# Patient Record
Sex: Female | Born: 1935 | Race: White | Hispanic: No | Marital: Single | Smoking: Never smoker
Health system: Southern US, Community
[De-identification: ages and names within clinical notes are randomized; demographics above are authoritative.]

## PROBLEM LIST (undated history)

## (undated) DIAGNOSIS — M545 Low back pain, unspecified: Secondary | ICD-10-CM

## (undated) DIAGNOSIS — E785 Hyperlipidemia, unspecified: Secondary | ICD-10-CM

## (undated) DIAGNOSIS — G4730 Sleep apnea, unspecified: Secondary | ICD-10-CM

## (undated) DIAGNOSIS — K649 Unspecified hemorrhoids: Secondary | ICD-10-CM

## (undated) DIAGNOSIS — N200 Calculus of kidney: Secondary | ICD-10-CM

## (undated) DIAGNOSIS — I671 Cerebral aneurysm, nonruptured: Secondary | ICD-10-CM

## (undated) DIAGNOSIS — M1990 Unspecified osteoarthritis, unspecified site: Secondary | ICD-10-CM

## (undated) DIAGNOSIS — I10 Essential (primary) hypertension: Secondary | ICD-10-CM

## (undated) DIAGNOSIS — K5730 Diverticulosis of large intestine without perforation or abscess without bleeding: Secondary | ICD-10-CM

## (undated) DIAGNOSIS — I4821 Permanent atrial fibrillation: Secondary | ICD-10-CM

## (undated) DIAGNOSIS — F411 Generalized anxiety disorder: Secondary | ICD-10-CM

## (undated) DIAGNOSIS — T8859XA Other complications of anesthesia, initial encounter: Secondary | ICD-10-CM

## (undated) DIAGNOSIS — M5126 Other intervertebral disc displacement, lumbar region: Secondary | ICD-10-CM

## (undated) DIAGNOSIS — T4145XA Adverse effect of unspecified anesthetic, initial encounter: Secondary | ICD-10-CM

## (undated) DIAGNOSIS — K635 Polyp of colon: Secondary | ICD-10-CM

## (undated) DIAGNOSIS — I82409 Acute embolism and thrombosis of unspecified deep veins of unspecified lower extremity: Secondary | ICD-10-CM

## (undated) DIAGNOSIS — Z85828 Personal history of other malignant neoplasm of skin: Secondary | ICD-10-CM

## (undated) DIAGNOSIS — I2699 Other pulmonary embolism without acute cor pulmonale: Secondary | ICD-10-CM

## (undated) HISTORY — DX: Permanent atrial fibrillation: I48.21

## (undated) HISTORY — PX: OTHER SURGICAL HISTORY: SHX169

## (undated) HISTORY — PX: BREAST CYST EXCISION: SHX579

## (undated) HISTORY — DX: Unspecified hemorrhoids: K64.9

## (undated) HISTORY — PX: TONSILLECTOMY AND ADENOIDECTOMY: SUR1326

## (undated) HISTORY — DX: Generalized anxiety disorder: F41.1

## (undated) HISTORY — PX: KNEE ARTHROSCOPY: SHX127

## (undated) HISTORY — PX: ABDOMINAL HYSTERECTOMY: SHX81

## (undated) HISTORY — DX: Calculus of kidney: N20.0

## (undated) HISTORY — DX: Acute embolism and thrombosis of unspecified deep veins of unspecified lower extremity: I82.409

## (undated) HISTORY — DX: Diverticulosis of large intestine without perforation or abscess without bleeding: K57.30

## (undated) HISTORY — DX: Other pulmonary embolism without acute cor pulmonale: I26.99

## (undated) HISTORY — DX: Cerebral aneurysm, nonruptured: I67.1

## (undated) HISTORY — DX: Polyp of colon: K63.5

## (undated) HISTORY — DX: Hyperlipidemia, unspecified: E78.5

## (undated) HISTORY — DX: Low back pain, unspecified: M54.50

## (undated) HISTORY — DX: Unspecified osteoarthritis, unspecified site: M19.90

## (undated) HISTORY — PX: APPENDECTOMY: SHX54

## (undated) HISTORY — DX: Low back pain: M54.5

## (undated) HISTORY — DX: Essential (primary) hypertension: I10

## (undated) HISTORY — PX: KNEE SURGERY: SHX244

---

## 1997-11-05 ENCOUNTER — Ambulatory Visit (HOSPITAL_COMMUNITY): Admission: RE | Admit: 1997-11-05 | Discharge: 1997-11-05 | Payer: Self-pay | Admitting: Obstetrics and Gynecology

## 1997-11-26 ENCOUNTER — Ambulatory Visit (HOSPITAL_COMMUNITY): Admission: RE | Admit: 1997-11-26 | Discharge: 1997-11-26 | Payer: Self-pay | Admitting: Gastroenterology

## 1997-12-24 ENCOUNTER — Inpatient Hospital Stay (HOSPITAL_COMMUNITY): Admission: RE | Admit: 1997-12-24 | Discharge: 1998-01-03 | Payer: Self-pay | Admitting: Obstetrics and Gynecology

## 1999-02-11 ENCOUNTER — Ambulatory Visit: Admission: RE | Admit: 1999-02-11 | Discharge: 1999-02-11 | Payer: Self-pay | Admitting: Pulmonary Disease

## 1999-05-21 ENCOUNTER — Other Ambulatory Visit: Admission: RE | Admit: 1999-05-21 | Discharge: 1999-05-21 | Payer: Self-pay | Admitting: Obstetrics and Gynecology

## 1999-07-20 ENCOUNTER — Encounter: Admission: RE | Admit: 1999-07-20 | Discharge: 1999-07-20 | Payer: Self-pay | Admitting: Obstetrics and Gynecology

## 1999-07-20 ENCOUNTER — Encounter: Payer: Self-pay | Admitting: Obstetrics and Gynecology

## 1999-09-14 ENCOUNTER — Ambulatory Visit (HOSPITAL_BASED_OUTPATIENT_CLINIC_OR_DEPARTMENT_OTHER): Admission: RE | Admit: 1999-09-14 | Discharge: 1999-09-14 | Payer: Self-pay | Admitting: Orthopedic Surgery

## 2000-01-06 ENCOUNTER — Encounter: Payer: Self-pay | Admitting: Emergency Medicine

## 2000-01-06 ENCOUNTER — Emergency Department (HOSPITAL_COMMUNITY): Admission: EM | Admit: 2000-01-06 | Discharge: 2000-01-06 | Payer: Self-pay | Admitting: Emergency Medicine

## 2000-08-04 ENCOUNTER — Encounter: Admission: RE | Admit: 2000-08-04 | Discharge: 2000-08-29 | Payer: Self-pay | Admitting: Family Medicine

## 2000-10-24 ENCOUNTER — Other Ambulatory Visit: Admission: RE | Admit: 2000-10-24 | Discharge: 2000-10-24 | Payer: Self-pay | Admitting: Obstetrics and Gynecology

## 2000-10-25 ENCOUNTER — Encounter: Payer: Self-pay | Admitting: Obstetrics and Gynecology

## 2000-10-25 ENCOUNTER — Encounter: Admission: RE | Admit: 2000-10-25 | Discharge: 2000-10-25 | Payer: Self-pay | Admitting: Obstetrics and Gynecology

## 2000-10-26 ENCOUNTER — Encounter: Admission: RE | Admit: 2000-10-26 | Discharge: 2000-10-26 | Payer: Self-pay | Admitting: Obstetrics and Gynecology

## 2000-10-26 ENCOUNTER — Encounter: Payer: Self-pay | Admitting: Obstetrics and Gynecology

## 2001-06-19 ENCOUNTER — Ambulatory Visit (HOSPITAL_COMMUNITY): Admission: RE | Admit: 2001-06-19 | Discharge: 2001-06-19 | Payer: Self-pay | Admitting: Neurosurgery

## 2001-11-14 ENCOUNTER — Encounter: Admission: RE | Admit: 2001-11-14 | Discharge: 2001-11-14 | Payer: Self-pay | Admitting: Rheumatology

## 2001-11-14 ENCOUNTER — Encounter: Payer: Self-pay | Admitting: Rheumatology

## 2002-03-20 ENCOUNTER — Other Ambulatory Visit: Admission: RE | Admit: 2002-03-20 | Discharge: 2002-03-20 | Payer: Self-pay | Admitting: Gynecology

## 2002-05-10 HISTORY — PX: JOINT REPLACEMENT: SHX530

## 2002-08-23 ENCOUNTER — Encounter: Payer: Self-pay | Admitting: Orthopedic Surgery

## 2002-08-27 ENCOUNTER — Inpatient Hospital Stay (HOSPITAL_COMMUNITY): Admission: RE | Admit: 2002-08-27 | Discharge: 2002-08-31 | Payer: Self-pay | Admitting: Orthopedic Surgery

## 2002-11-16 ENCOUNTER — Ambulatory Visit (HOSPITAL_COMMUNITY): Admission: RE | Admit: 2002-11-16 | Discharge: 2002-11-17 | Payer: Self-pay | Admitting: Internal Medicine

## 2003-06-24 ENCOUNTER — Other Ambulatory Visit: Admission: RE | Admit: 2003-06-24 | Discharge: 2003-06-24 | Payer: Self-pay | Admitting: Gynecology

## 2004-03-19 ENCOUNTER — Encounter: Admission: RE | Admit: 2004-03-19 | Discharge: 2004-03-19 | Payer: Self-pay | Admitting: Gynecology

## 2004-03-23 ENCOUNTER — Ambulatory Visit: Payer: Self-pay | Admitting: Cardiology

## 2004-03-30 ENCOUNTER — Encounter: Admission: RE | Admit: 2004-03-30 | Discharge: 2004-03-30 | Payer: Self-pay | Admitting: Gynecology

## 2004-04-22 ENCOUNTER — Ambulatory Visit: Payer: Self-pay | Admitting: Cardiology

## 2004-04-27 ENCOUNTER — Ambulatory Visit: Payer: Self-pay | Admitting: Cardiovascular Disease

## 2004-06-11 ENCOUNTER — Ambulatory Visit: Payer: Self-pay | Admitting: Cardiology

## 2004-06-15 ENCOUNTER — Ambulatory Visit: Payer: Self-pay | Admitting: *Deleted

## 2004-07-06 ENCOUNTER — Ambulatory Visit: Payer: Self-pay | Admitting: Gastroenterology

## 2004-07-14 ENCOUNTER — Ambulatory Visit: Payer: Self-pay | Admitting: Gastroenterology

## 2004-08-03 ENCOUNTER — Ambulatory Visit: Payer: Self-pay | Admitting: Cardiology

## 2004-08-10 ENCOUNTER — Ambulatory Visit: Payer: Self-pay | Admitting: Cardiology

## 2004-09-15 ENCOUNTER — Encounter: Admission: RE | Admit: 2004-09-15 | Discharge: 2004-09-15 | Payer: Self-pay | Admitting: General Surgery

## 2004-11-02 ENCOUNTER — Ambulatory Visit: Payer: Self-pay | Admitting: Internal Medicine

## 2005-02-23 ENCOUNTER — Ambulatory Visit: Payer: Self-pay | Admitting: *Deleted

## 2005-02-24 ENCOUNTER — Encounter: Admission: RE | Admit: 2005-02-24 | Discharge: 2005-02-24 | Payer: Self-pay | Admitting: Gynecology

## 2005-02-25 ENCOUNTER — Ambulatory Visit: Payer: Self-pay | Admitting: Internal Medicine

## 2005-03-02 ENCOUNTER — Ambulatory Visit: Payer: Self-pay | Admitting: Internal Medicine

## 2005-04-16 ENCOUNTER — Ambulatory Visit: Payer: Self-pay | Admitting: Gastroenterology

## 2005-05-31 ENCOUNTER — Ambulatory Visit: Payer: Self-pay | Admitting: Gastroenterology

## 2005-06-08 ENCOUNTER — Ambulatory Visit: Payer: Self-pay | Admitting: Gastroenterology

## 2005-07-07 ENCOUNTER — Ambulatory Visit: Payer: Self-pay | Admitting: Gastroenterology

## 2005-08-04 ENCOUNTER — Ambulatory Visit: Payer: Self-pay | Admitting: *Deleted

## 2005-09-15 ENCOUNTER — Ambulatory Visit: Payer: Self-pay | Admitting: Gastroenterology

## 2005-09-17 ENCOUNTER — Ambulatory Visit: Payer: Self-pay | Admitting: Internal Medicine

## 2005-09-21 ENCOUNTER — Ambulatory Visit: Payer: Self-pay | Admitting: Internal Medicine

## 2006-01-31 ENCOUNTER — Ambulatory Visit: Payer: Self-pay | Admitting: Pulmonary Disease

## 2006-02-23 ENCOUNTER — Ambulatory Visit: Payer: Self-pay | Admitting: Pulmonary Disease

## 2006-03-01 ENCOUNTER — Other Ambulatory Visit: Admission: RE | Admit: 2006-03-01 | Discharge: 2006-03-01 | Payer: Self-pay | Admitting: Gynecology

## 2006-03-02 ENCOUNTER — Encounter: Admission: RE | Admit: 2006-03-02 | Discharge: 2006-03-02 | Payer: Self-pay | Admitting: Gynecology

## 2006-03-08 ENCOUNTER — Ambulatory Visit: Payer: Self-pay

## 2006-03-10 ENCOUNTER — Ambulatory Visit: Payer: Self-pay | Admitting: Cardiology

## 2006-08-31 ENCOUNTER — Ambulatory Visit: Payer: Self-pay | Admitting: *Deleted

## 2006-09-01 ENCOUNTER — Ambulatory Visit: Payer: Self-pay | Admitting: Pulmonary Disease

## 2006-09-13 ENCOUNTER — Ambulatory Visit: Payer: Self-pay

## 2006-09-13 LAB — CONVERTED CEMR LAB
ALT: 17 units/L (ref 0–40)
AST: 16 units/L (ref 0–37)
Albumin: 3.6 g/dL (ref 3.5–5.2)
Alkaline Phosphatase: 47 units/L (ref 39–117)
Bilirubin, Direct: 0.1 mg/dL (ref 0.0–0.3)
Cholesterol: 157 mg/dL (ref 0–200)
HDL: 55.5 mg/dL (ref >39.0)
LDL Cholesterol: 84 mg/dL (ref 0–99)
Total Bilirubin: 0.8 mg/dL (ref 0.3–1.2)
Total CHOL/HDL Ratio: 2.8
Total Protein: 6.9 g/dL (ref 6.0–8.3)
Triglycerides: 88 mg/dL (ref 0–149)
VLDL: 18 mg/dL (ref 0–40)

## 2006-09-15 ENCOUNTER — Ambulatory Visit: Payer: Self-pay | Admitting: Cardiology

## 2007-03-02 ENCOUNTER — Ambulatory Visit: Payer: Self-pay | Admitting: Pulmonary Disease

## 2007-03-14 ENCOUNTER — Ambulatory Visit: Payer: Self-pay | Admitting: Cardiology

## 2007-03-14 LAB — CONVERTED CEMR LAB
ALT: 17 units/L (ref 0–35)
AST: 21 units/L (ref 0–37)
Albumin: 3.7 g/dL (ref 3.5–5.2)
Alkaline Phosphatase: 48 units/L (ref 39–117)
Bilirubin, Direct: 0.1 mg/dL (ref 0.0–0.3)
Cholesterol: 148 mg/dL (ref 0–200)
HDL: 53.1 mg/dL (ref >39.0)
LDL Cholesterol: 78 mg/dL (ref 0–99)
Total Bilirubin: 0.6 mg/dL (ref 0.3–1.2)
Total CHOL/HDL Ratio: 2.8
Total Protein: 6.8 g/dL (ref 6.0–8.3)
Triglycerides: 84 mg/dL (ref 0–149)
VLDL: 17 mg/dL (ref 0–40)

## 2007-03-20 ENCOUNTER — Ambulatory Visit: Payer: Self-pay | Admitting: Internal Medicine

## 2007-03-20 LAB — CONVERTED CEMR LAB: TSH: 2.38 microintl units/mL (ref 0.35–5.50)

## 2007-05-01 ENCOUNTER — Telehealth: Payer: Self-pay | Admitting: Pulmonary Disease

## 2007-05-18 ENCOUNTER — Encounter: Admission: RE | Admit: 2007-05-18 | Discharge: 2007-05-18 | Payer: Self-pay | Admitting: Gynecology

## 2007-08-07 ENCOUNTER — Ambulatory Visit: Payer: Self-pay | Admitting: Pulmonary Disease

## 2007-08-07 LAB — CONVERTED CEMR LAB
BUN: 13 mg/dL (ref 6–23)
Creatinine, Ser: 0.5 mg/dL (ref 0.4–1.2)

## 2007-08-08 ENCOUNTER — Ambulatory Visit (HOSPITAL_COMMUNITY): Admission: RE | Admit: 2007-08-08 | Discharge: 2007-08-08 | Payer: Self-pay | Admitting: Pulmonary Disease

## 2007-08-15 ENCOUNTER — Telehealth: Payer: Self-pay | Admitting: Pulmonary Disease

## 2007-08-15 DIAGNOSIS — K219 Gastro-esophageal reflux disease without esophagitis: Secondary | ICD-10-CM | POA: Insufficient documentation

## 2007-08-15 DIAGNOSIS — I82409 Acute embolism and thrombosis of unspecified deep veins of unspecified lower extremity: Secondary | ICD-10-CM

## 2007-08-15 DIAGNOSIS — E785 Hyperlipidemia, unspecified: Secondary | ICD-10-CM | POA: Insufficient documentation

## 2007-08-15 HISTORY — DX: Acute embolism and thrombosis of unspecified deep veins of unspecified lower extremity: I82.409

## 2007-08-16 ENCOUNTER — Ambulatory Visit: Payer: Self-pay | Admitting: Pulmonary Disease

## 2007-08-16 DIAGNOSIS — I671 Cerebral aneurysm, nonruptured: Secondary | ICD-10-CM | POA: Insufficient documentation

## 2007-08-18 ENCOUNTER — Encounter: Payer: Self-pay | Admitting: Pulmonary Disease

## 2007-08-25 ENCOUNTER — Encounter: Payer: Self-pay | Admitting: Pulmonary Disease

## 2007-08-28 ENCOUNTER — Ambulatory Visit: Payer: Self-pay | Admitting: Cardiology

## 2007-08-28 LAB — CONVERTED CEMR LAB
ALT: 18 units/L (ref 0–35)
AST: 22 units/L (ref 0–37)
Albumin: 3.9 g/dL (ref 3.5–5.2)
Alkaline Phosphatase: 49 units/L (ref 39–117)
Bilirubin, Direct: 0.1 mg/dL (ref 0.0–0.3)
Cholesterol: 135 mg/dL (ref 0–200)
HDL: 54.1 mg/dL (ref >39.0)
LDL Cholesterol: 66 mg/dL (ref 0–99)
Total Bilirubin: 0.8 mg/dL (ref 0.3–1.2)
Total CHOL/HDL Ratio: 2.5
Total Protein: 7.1 g/dL (ref 6.0–8.3)
Triglycerides: 75 mg/dL (ref 0–149)
VLDL: 15 mg/dL (ref 0–40)

## 2007-09-01 ENCOUNTER — Encounter: Admission: RE | Admit: 2007-09-01 | Discharge: 2007-09-18 | Payer: Self-pay | Admitting: Neurology

## 2007-09-04 ENCOUNTER — Ambulatory Visit: Payer: Self-pay | Admitting: Cardiovascular Disease

## 2007-09-04 ENCOUNTER — Telehealth: Payer: Self-pay | Admitting: Pulmonary Disease

## 2007-09-08 ENCOUNTER — Ambulatory Visit: Payer: Self-pay | Admitting: Cardiology

## 2007-09-19 ENCOUNTER — Ambulatory Visit: Payer: Self-pay | Admitting: Pulmonary Disease

## 2007-11-14 ENCOUNTER — Telehealth (INDEPENDENT_AMBULATORY_CARE_PROVIDER_SITE_OTHER): Payer: Self-pay | Admitting: *Deleted

## 2007-12-19 ENCOUNTER — Encounter: Payer: Self-pay | Admitting: Pulmonary Disease

## 2008-01-04 ENCOUNTER — Encounter: Payer: Self-pay | Admitting: Pulmonary Disease

## 2008-02-07 ENCOUNTER — Telehealth: Payer: Self-pay | Admitting: Pulmonary Disease

## 2008-02-07 ENCOUNTER — Ambulatory Visit: Payer: Self-pay | Admitting: Pulmonary Disease

## 2008-02-23 ENCOUNTER — Ambulatory Visit: Payer: Self-pay | Admitting: Cardiology

## 2008-02-23 LAB — CONVERTED CEMR LAB
ALT: 16 units/L (ref 0–35)
AST: 20 units/L (ref 0–37)
Albumin: 3.9 g/dL (ref 3.5–5.2)
Alkaline Phosphatase: 44 units/L (ref 39–117)
Bilirubin, Direct: 0.1 mg/dL (ref 0.0–0.3)
Cholesterol: 162 mg/dL (ref 0–200)
HDL: 62.5 mg/dL (ref >39.0)
LDL Cholesterol: 83 mg/dL (ref 0–99)
Total Bilirubin: 0.7 mg/dL (ref 0.3–1.2)
Total CHOL/HDL Ratio: 2.6
Total Protein: 7.1 g/dL (ref 6.0–8.3)
Triglycerides: 85 mg/dL (ref 0–149)
VLDL: 17 mg/dL (ref 0–40)

## 2008-03-11 ENCOUNTER — Ambulatory Visit: Payer: Self-pay | Admitting: Cardiovascular Disease

## 2008-04-01 ENCOUNTER — Ambulatory Visit: Payer: Self-pay | Admitting: Pulmonary Disease

## 2008-04-01 LAB — CONVERTED CEMR LAB
BUN: 15 mg/dL (ref 6–23)
CO2: 28 meq/L (ref 19–32)
Calcium: 8.9 mg/dL (ref 8.4–10.5)
Chloride: 108 meq/L (ref 96–112)
Creatinine, Ser: 0.5 mg/dL (ref 0.4–1.2)
GFR calc Af Amer: 152 mL/min
GFR calc non Af Amer: 126 mL/min
Glucose, Bld: 104 mg/dL — ABNORMAL HIGH (ref 70–99)
Potassium: 4.4 meq/L (ref 3.5–5.1)
Sodium: 141 meq/L (ref 135–145)

## 2008-04-08 ENCOUNTER — Ambulatory Visit: Payer: Self-pay | Admitting: Cardiovascular Disease

## 2008-04-11 ENCOUNTER — Ambulatory Visit: Payer: Self-pay | Admitting: Pulmonary Disease

## 2008-04-22 ENCOUNTER — Encounter: Payer: Self-pay | Admitting: Pulmonary Disease

## 2008-05-09 ENCOUNTER — Encounter: Payer: Self-pay | Admitting: Pulmonary Disease

## 2008-05-21 ENCOUNTER — Encounter: Admission: RE | Admit: 2008-05-21 | Discharge: 2008-05-21 | Payer: Self-pay | Admitting: Gynecology

## 2008-11-12 ENCOUNTER — Encounter: Payer: Self-pay | Admitting: Pulmonary Disease

## 2009-01-10 ENCOUNTER — Encounter: Payer: Self-pay | Admitting: Pulmonary Disease

## 2009-03-03 ENCOUNTER — Ambulatory Visit: Payer: Self-pay | Admitting: Cardiology

## 2009-03-03 ENCOUNTER — Ambulatory Visit: Payer: Self-pay | Admitting: Cardiovascular Disease

## 2009-03-03 DIAGNOSIS — I4821 Permanent atrial fibrillation: Secondary | ICD-10-CM | POA: Insufficient documentation

## 2009-03-03 DIAGNOSIS — I4891 Unspecified atrial fibrillation: Secondary | ICD-10-CM | POA: Insufficient documentation

## 2009-03-03 LAB — CONVERTED CEMR LAB
ALT: 16 units/L (ref 0–35)
AST: 15 units/L (ref 0–37)
Albumin: 3.8 g/dL (ref 3.5–5.2)
Alkaline Phosphatase: 45 units/L (ref 39–117)
Bilirubin, Direct: 0.1 mg/dL (ref 0.0–0.3)
Cholesterol: 143 mg/dL (ref 0–200)
HDL: 58.7 mg/dL (ref >39.00)
LDL Cholesterol: 70 mg/dL (ref 0–99)
Total Bilirubin: 0.7 mg/dL (ref 0.3–1.2)
Total CHOL/HDL Ratio: 2
Total Protein: 6.8 g/dL (ref 6.0–8.3)
Triglycerides: 72 mg/dL (ref 0.0–149.0)
VLDL: 14.4 mg/dL (ref 0.0–40.0)

## 2009-03-07 ENCOUNTER — Encounter: Payer: Self-pay | Admitting: Cardiovascular Disease

## 2009-03-07 ENCOUNTER — Telehealth: Payer: Self-pay | Admitting: Internal Medicine

## 2009-03-07 ENCOUNTER — Ambulatory Visit: Payer: Self-pay

## 2009-03-07 ENCOUNTER — Ambulatory Visit: Payer: Self-pay | Admitting: Cardiovascular Disease

## 2009-03-07 ENCOUNTER — Ambulatory Visit (HOSPITAL_COMMUNITY): Admission: RE | Admit: 2009-03-07 | Discharge: 2009-03-07 | Payer: Self-pay | Admitting: Cardiovascular Disease

## 2009-03-11 ENCOUNTER — Telehealth (INDEPENDENT_AMBULATORY_CARE_PROVIDER_SITE_OTHER): Payer: Self-pay | Admitting: Physician Assistant

## 2009-03-11 ENCOUNTER — Telehealth: Payer: Self-pay | Admitting: Internal Medicine

## 2009-03-11 ENCOUNTER — Ambulatory Visit: Payer: Self-pay | Admitting: Cardiology

## 2009-03-11 LAB — CONVERTED CEMR LAB
Cholesterol, target level: 200 mg/dL
HDL goal, serum: 40 mg/dL
LDL Goal: 130 mg/dL

## 2009-03-28 ENCOUNTER — Ambulatory Visit: Payer: Self-pay | Admitting: Internal Medicine

## 2009-03-30 ENCOUNTER — Telehealth (INDEPENDENT_AMBULATORY_CARE_PROVIDER_SITE_OTHER): Payer: Self-pay | Admitting: *Deleted

## 2009-03-30 ENCOUNTER — Ambulatory Visit: Payer: Self-pay | Admitting: Vascular Surgery

## 2009-03-30 ENCOUNTER — Emergency Department (HOSPITAL_COMMUNITY): Admission: EM | Admit: 2009-03-30 | Discharge: 2009-03-30 | Payer: Self-pay | Admitting: Emergency Medicine

## 2009-03-30 ENCOUNTER — Encounter (INDEPENDENT_AMBULATORY_CARE_PROVIDER_SITE_OTHER): Payer: Self-pay | Admitting: Emergency Medicine

## 2009-03-31 LAB — CONVERTED CEMR LAB
BUN: 11 mg/dL (ref 6–23)
Basophils Absolute: 0 10*3/uL (ref 0.0–0.1)
Basophils Relative: 1 % (ref 0–1)
CO2: 27 meq/L (ref 19–32)
Calcium: 9.7 mg/dL (ref 8.4–10.5)
Chloride: 104 meq/L (ref 96–112)
Creatinine, Ser: 0.6 mg/dL (ref 0.40–1.20)
Eosinophils Absolute: 0.1 10*3/uL (ref 0.0–0.7)
Eosinophils Relative: 1 % (ref 0–5)
Glucose, Bld: 119 mg/dL — ABNORMAL HIGH (ref 70–99)
HCT: 38.5 % (ref 36.0–46.0)
Hemoglobin: 12.4 g/dL (ref 12.0–15.0)
Lymphocytes Relative: 42 % (ref 12–46)
Lymphs Abs: 2.8 10*3/uL (ref 0.7–4.0)
MCHC: 32.2 g/dL (ref 30.0–36.0)
MCV: 91.4 fL (ref 78.0–100.0)
Monocytes Absolute: 0.6 10*3/uL (ref 0.1–1.0)
Monocytes Relative: 9 % (ref 3–12)
Neutro Abs: 3.2 10*3/uL (ref 1.7–7.7)
Neutrophils Relative %: 47 % (ref 43–77)
Platelets: 240 10*3/uL (ref 150–400)
Potassium: 4.7 meq/L (ref 3.5–5.3)
RBC: 4.21 M/uL (ref 3.87–5.11)
RDW: 13 % (ref 11.5–15.5)
Sodium: 140 meq/L (ref 135–145)
TSH: 1.906 microintl units/mL (ref 0.350–4.500)
WBC: 6.7 10*3/uL (ref 4.0–10.5)

## 2009-04-01 ENCOUNTER — Ambulatory Visit: Payer: Self-pay | Admitting: Pulmonary Disease

## 2009-04-02 ENCOUNTER — Encounter: Payer: Self-pay | Admitting: Adult Health

## 2009-04-07 LAB — CONVERTED CEMR LAB
BUN: 11 mg/dL (ref 6–23)
Basophils Absolute: 0 10*3/uL (ref 0.0–0.1)
Basophils Relative: 0.4 % (ref 0.0–3.0)
CO2: 30 meq/L (ref 19–32)
Calcium: 9.1 mg/dL (ref 8.4–10.5)
Chloride: 104 meq/L (ref 96–112)
Creatinine, Ser: 0.6 mg/dL (ref 0.4–1.2)
Eosinophils Absolute: 0 10*3/uL (ref 0.0–0.7)
Eosinophils Relative: 0.6 % (ref 0.0–5.0)
GFR calc non Af Amer: 101.72 mL/min (ref >60)
Glucose, Bld: 109 mg/dL — ABNORMAL HIGH (ref 70–99)
HCT: 35.8 % — ABNORMAL LOW (ref 36.0–46.0)
Hemoglobin: 12.2 g/dL (ref 12.0–15.0)
Lymphocytes Relative: 24.8 % (ref 12.0–46.0)
Lymphs Abs: 2 10*3/uL (ref 0.7–4.0)
MCHC: 33.9 g/dL (ref 30.0–36.0)
MCV: 92.1 fL (ref 78.0–100.0)
Monocytes Absolute: 0.8 10*3/uL (ref 0.1–1.0)
Monocytes Relative: 9.5 % (ref 3.0–12.0)
Neutro Abs: 5.3 10*3/uL (ref 1.4–7.7)
Neutrophils Relative %: 64.7 % (ref 43.0–77.0)
Platelets: 210 10*3/uL (ref 150.0–400.0)
Potassium: 4.6 meq/L (ref 3.5–5.1)
RBC: 3.89 M/uL (ref 3.87–5.11)
RDW: 12.6 % (ref 11.5–14.6)
Sodium: 139 meq/L (ref 135–145)
Uric Acid, Serum: 3.6 mg/dL (ref 2.4–7.0)
WBC: 8.1 10*3/uL (ref 4.5–10.5)

## 2009-04-08 ENCOUNTER — Ambulatory Visit: Payer: Self-pay | Admitting: Pulmonary Disease

## 2009-04-18 ENCOUNTER — Telehealth: Payer: Self-pay | Admitting: Internal Medicine

## 2009-04-21 ENCOUNTER — Encounter: Payer: Self-pay | Admitting: Internal Medicine

## 2009-04-29 ENCOUNTER — Telehealth (INDEPENDENT_AMBULATORY_CARE_PROVIDER_SITE_OTHER): Payer: Self-pay | Admitting: *Deleted

## 2009-05-08 ENCOUNTER — Inpatient Hospital Stay (HOSPITAL_COMMUNITY): Admission: AD | Admit: 2009-05-08 | Discharge: 2009-05-11 | Payer: Self-pay | Admitting: Internal Medicine

## 2009-05-09 ENCOUNTER — Ambulatory Visit: Payer: Self-pay | Admitting: Vascular Surgery

## 2009-05-09 ENCOUNTER — Encounter (INDEPENDENT_AMBULATORY_CARE_PROVIDER_SITE_OTHER): Payer: Self-pay | Admitting: Internal Medicine

## 2009-05-12 ENCOUNTER — Telehealth: Payer: Self-pay | Admitting: Pulmonary Disease

## 2009-05-21 ENCOUNTER — Ambulatory Visit: Payer: Self-pay | Admitting: Pulmonary Disease

## 2009-05-21 DIAGNOSIS — I10 Essential (primary) hypertension: Secondary | ICD-10-CM | POA: Insufficient documentation

## 2009-05-21 DIAGNOSIS — F411 Generalized anxiety disorder: Secondary | ICD-10-CM | POA: Insufficient documentation

## 2009-05-27 ENCOUNTER — Encounter: Payer: Self-pay | Admitting: Infectious Diseases

## 2009-05-28 ENCOUNTER — Ambulatory Visit: Payer: Self-pay | Admitting: Infectious Diseases

## 2009-05-28 ENCOUNTER — Encounter (INDEPENDENT_AMBULATORY_CARE_PROVIDER_SITE_OTHER): Payer: Self-pay | Admitting: *Deleted

## 2009-05-28 ENCOUNTER — Ambulatory Visit (HOSPITAL_COMMUNITY): Admission: RE | Admit: 2009-05-28 | Discharge: 2009-05-28 | Payer: Self-pay | Admitting: Infectious Diseases

## 2009-05-28 LAB — CONVERTED CEMR LAB
Basophils Absolute: 0.1 10*3/uL (ref 0.0–0.1)
Basophils Relative: 1 % (ref 0–1)
Eosinophils Absolute: 0.1 10*3/uL (ref 0.0–0.7)
Eosinophils Relative: 1 % (ref 0–5)
HCT: 38.7 % (ref 36.0–46.0)
Hemoglobin: 11.9 g/dL — ABNORMAL LOW (ref 12.0–15.0)
Lymphocytes Relative: 31 % (ref 12–46)
Lymphs Abs: 2.4 10*3/uL (ref 0.7–4.0)
MCHC: 30.7 g/dL (ref 30.0–36.0)
MCV: 94.6 fL (ref >=78.0)
Monocytes Absolute: 0.8 10*3/uL (ref 0.1–1.0)
Monocytes Relative: 10 % (ref 3–12)
Neutro Abs: 4.5 10*3/uL (ref 1.7–7.7)
Neutrophils Relative %: 58 % (ref 43–77)
Platelets: 289 10*3/uL (ref 150–400)
RBC: 4.09 M/uL (ref 3.87–5.11)
RDW: 14.1 % (ref 11.5–15.5)
Sed Rate: 8 mm/hr (ref 0–22)
WBC: 7.8 10*3/uL (ref 4.0–10.5)

## 2009-06-04 ENCOUNTER — Ambulatory Visit: Payer: Self-pay | Admitting: Infectious Diseases

## 2009-06-19 ENCOUNTER — Telehealth: Payer: Self-pay | Admitting: Infectious Diseases

## 2009-06-24 ENCOUNTER — Ambulatory Visit: Payer: Self-pay | Admitting: Internal Medicine

## 2009-07-10 ENCOUNTER — Ambulatory Visit: Payer: Self-pay | Admitting: Infectious Diseases

## 2009-08-19 ENCOUNTER — Ambulatory Visit: Payer: Self-pay | Admitting: Pulmonary Disease

## 2009-08-20 ENCOUNTER — Ambulatory Visit: Payer: Self-pay | Admitting: Pulmonary Disease

## 2009-08-22 ENCOUNTER — Ambulatory Visit: Payer: Self-pay | Admitting: Internal Medicine

## 2009-08-24 LAB — CONVERTED CEMR LAB
ALT: 17 units/L (ref 0–35)
AST: 18 units/L (ref 0–37)
Albumin: 4.2 g/dL (ref 3.5–5.2)
Alkaline Phosphatase: 49 units/L (ref 39–117)
BUN: 11 mg/dL (ref 6–23)
Basophils Absolute: 0 10*3/uL (ref 0.0–0.1)
Basophils Relative: 0.5 % (ref 0.0–3.0)
Bilirubin, Direct: 0.2 mg/dL (ref 0.0–0.3)
CO2: 28 meq/L (ref 19–32)
Calcium: 9.4 mg/dL (ref 8.4–10.5)
Chloride: 107 meq/L (ref 96–112)
Cholesterol: 130 mg/dL (ref 0–200)
Creatinine, Ser: 0.6 mg/dL (ref 0.4–1.2)
Eosinophils Absolute: 0 10*3/uL (ref 0.0–0.7)
Eosinophils Relative: 0.5 % (ref 0.0–5.0)
GFR calc non Af Amer: 101.62 mL/min (ref >60)
Glucose, Bld: 110 mg/dL — ABNORMAL HIGH (ref 70–99)
HCT: 38.9 % (ref 36.0–46.0)
HDL: 64 mg/dL (ref >39.00)
Hemoglobin: 13.1 g/dL (ref 12.0–15.0)
LDL Cholesterol: 55 mg/dL (ref 0–99)
Lymphocytes Relative: 21.7 % (ref 12.0–46.0)
Lymphs Abs: 1.6 10*3/uL (ref 0.7–4.0)
MCHC: 33.8 g/dL (ref 30.0–36.0)
MCV: 89.5 fL (ref 78.0–100.0)
Monocytes Absolute: 0.5 10*3/uL (ref 0.1–1.0)
Monocytes Relative: 6.6 % (ref 3.0–12.0)
Neutro Abs: 5.2 10*3/uL (ref 1.4–7.7)
Neutrophils Relative %: 70.7 % (ref 43.0–77.0)
Platelets: 226 10*3/uL (ref 150.0–400.0)
Potassium: 4.4 meq/L (ref 3.5–5.1)
RBC: 4.34 M/uL (ref 3.87–5.11)
RDW: 13.5 % (ref 11.5–14.6)
Sodium: 141 meq/L (ref 135–145)
TSH: 1.39 microintl units/mL (ref 0.35–5.50)
Total Bilirubin: 0.5 mg/dL (ref 0.3–1.2)
Total CHOL/HDL Ratio: 2
Total Protein: 7.3 g/dL (ref 6.0–8.3)
Triglycerides: 54 mg/dL (ref 0.0–149.0)
VLDL: 10.8 mg/dL (ref 0.0–40.0)
WBC: 7.4 10*3/uL (ref 4.5–10.5)

## 2009-09-04 ENCOUNTER — Ambulatory Visit: Payer: Self-pay | Admitting: Infectious Diseases

## 2009-09-23 ENCOUNTER — Encounter: Payer: Self-pay | Admitting: Pulmonary Disease

## 2009-09-29 ENCOUNTER — Ambulatory Visit: Payer: Self-pay | Admitting: Infectious Diseases

## 2009-10-16 ENCOUNTER — Encounter: Payer: Self-pay | Admitting: Infectious Diseases

## 2009-10-16 ENCOUNTER — Encounter: Payer: Self-pay | Admitting: Pulmonary Disease

## 2009-10-20 ENCOUNTER — Telehealth: Payer: Self-pay | Admitting: Pulmonary Disease

## 2009-12-02 ENCOUNTER — Encounter: Payer: Self-pay | Admitting: Pulmonary Disease

## 2009-12-23 ENCOUNTER — Ambulatory Visit: Payer: Self-pay | Admitting: Internal Medicine

## 2010-02-18 ENCOUNTER — Ambulatory Visit: Payer: Self-pay | Admitting: Pulmonary Disease

## 2010-02-21 DIAGNOSIS — M8580 Other specified disorders of bone density and structure, unspecified site: Secondary | ICD-10-CM | POA: Insufficient documentation

## 2010-03-02 ENCOUNTER — Ambulatory Visit: Payer: Self-pay | Admitting: Internal Medicine

## 2010-03-02 LAB — CONVERTED CEMR LAB
ALT: 14 units/L (ref 0–35)
AST: 14 units/L (ref 0–37)
Albumin: 3.7 g/dL (ref 3.5–5.2)
Alkaline Phosphatase: 41 units/L (ref 39–117)
Bilirubin, Direct: 0.1 mg/dL (ref 0.0–0.3)
Cholesterol: 122 mg/dL (ref 0–200)
HDL: 59 mg/dL (ref >39.00)
LDL Cholesterol: 53 mg/dL (ref 0–99)
Total Bilirubin: 0.5 mg/dL (ref 0.3–1.2)
Total CHOL/HDL Ratio: 2
Total Protein: 6.4 g/dL (ref 6.0–8.3)
Triglycerides: 51 mg/dL (ref 0.0–149.0)
VLDL: 10.2 mg/dL (ref 0.0–40.0)

## 2010-03-09 ENCOUNTER — Ambulatory Visit: Payer: Self-pay | Admitting: Cardiology

## 2010-03-16 ENCOUNTER — Telehealth (INDEPENDENT_AMBULATORY_CARE_PROVIDER_SITE_OTHER): Payer: Self-pay | Admitting: *Deleted

## 2010-03-26 ENCOUNTER — Telehealth: Payer: Self-pay | Admitting: Pulmonary Disease

## 2010-04-07 ENCOUNTER — Telehealth: Payer: Self-pay | Admitting: Pulmonary Disease

## 2010-04-07 ENCOUNTER — Telehealth: Payer: Self-pay | Admitting: Internal Medicine

## 2010-05-06 ENCOUNTER — Telehealth: Payer: Self-pay | Admitting: Internal Medicine

## 2010-05-21 ENCOUNTER — Telehealth (INDEPENDENT_AMBULATORY_CARE_PROVIDER_SITE_OTHER): Payer: Self-pay

## 2010-05-27 ENCOUNTER — Encounter: Payer: Self-pay | Admitting: Gastroenterology

## 2010-05-30 ENCOUNTER — Encounter: Payer: Self-pay | Admitting: Gynecology

## 2010-05-31 ENCOUNTER — Encounter: Payer: Self-pay | Admitting: Gastroenterology

## 2010-06-11 NOTE — Progress Notes (Signed)
Summary: Pt req to see Kriste Basque today  Medications Added ACIPHEX 20 MG TBEC (RABEPRAZOLE SODIUM)  LIPITOR 20 MG TABS (ATORVASTATIN CALCIUM)  LOPRESSOR 50 MG TABS (METOPROLOL TARTRATE)  AVELOX 400 MG  TABS (MOXIFLOXACIN HCL) Take 1 tablet by mouth once a day      Allergies Added: NKDA Phone Note Call from Patient   Caller: Patient Call For: Barron Vanloan Summary of Call: Pt wants to see Dr. Kriste Basque today. Has congestion in lungs and head is stopped up.  [facility] Initial call taken by: Tivis Ringer,  May 01, 2007 9:04 AM  Follow-up for Phone Call        Pt c/o head and chest congestion x 4 days.  Also has ear pain and dty cough.  She has not been seen since April 08'.  She is reqesting that we call her in prednisone and abx.  Please advise.  Triage call Follow-up by: Vernie Murders,  May 01, 2007 9:18 AM  Additional Follow-up for Phone Call Additional follow up Details #1::        Called spoke with pt advised per SN ok to call in avelox 400mg  #7 1 by mouth once daily 0 ref and medrol dose pack #1 use as directed 0 ref. Also advised to continue Mucinex two times a day and nasal saline. [facility] Additional Follow-up by: Cloyde Reams RN,  May 01, 2007 10:48 AM    New/Updated Medications: ACIPHEX 20 MG TBEC (RABEPRAZOLE SODIUM)  LIPITOR 20 MG TABS (ATORVASTATIN CALCIUM)  LOPRESSOR 50 MG TABS (METOPROLOL TARTRATE)  AVELOX 400 MG  TABS (MOXIFLOXACIN HCL) Take 1 tablet by mouth once a day   Prescriptions: AVELOX 400 MG  TABS (MOXIFLOXACIN HCL) Take 1 tablet by mouth once a day  #7 x 0   Entered by:   Cloyde Reams RN   Authorized by:   Michele Mcalpine MD   Signed by:   Cloyde Reams RN on 05/01/2007   Method used:   [facility] to ...       [facility] [address] [phone]*       [address]       [place], [place]  [address]       Ph: [phone] or [phone]       Fax:  [phone]   RxID:   [id number]

## 2010-06-11 NOTE — Op Note (Signed)
Summary: Bilateral occipital nerve block/[facility]  Bilateral occipital nerve block/[facility]   Imported By: Sherian Rein 01/28/2009 07:58:26  _____________________________________________________________________  External Attachment:    Type:   Image     Comment:   External Document

## 2010-06-11 NOTE — Assessment & Plan Note (Signed)
Summary: pain in head/apc   Chief Complaint:  18 month ROV....  History of Present Illness: 77 y/o WF here for a follow up visit... she was last seen in 2007 complaining of sinusitis, and later insomnia... she has mult med problems as noted below... she was [facility]...  Presents today c/o 1 month hx of headaches... starts in back of head & spreads all over... "I never get headaches"... denies balance prob, visual symptoms, etc... she thinks it's "sinus" because she is also congested, blowing out "cold" w/ blood streaks, "sandpaper tongue" and throat closing up... she notes nasal congestion & it closes up at night... states 2 operations from DrPatsevorus in past...   Current Problems:  SINUSITIS, ACUTE (ICD-461.9)  HYPERTENSION (ICD-401.9)  ~  2DEcho 1996 showed mild LVH, norm valves, EF=60%...  ~  NuclearStressTest 4/04 was neg- no infarct or ischemia, EF=69%... similiar to 1999 study.  Hx of TACHYARRHYTHMIA (ICD-785.0) - hx atrial tachycardia and AV nodal reentry... s/p ablation  Hx of DEEP VENOUS THROMBOPHLEBITIS (ICD-453.40) HYPERLIPIDEMIA (ICD-272.4)  GERD (ICD-530.81) - last EGD was 3/06 showing chr gastritis & duodenitis... DIVERTICULOSIS OF COLON (ICD-562.10) & COLONIC POLYPS (ICD-211.3) - last colonoscopy was 1/07 by DrSam showing divertics only... f/u planned 14yrs... (last polyp was 1999 tiny & cauterized).  Hx of RENAL CALCULUS (ICD-592.0) - stones seen on CTAbd 2007, non-obstructing & no hx of problems.  DEGENERATIVE JOINT DISEASE (ICD-715.90) - prev hx of arthroscopy w/ DVT and PTE in 1995... she had a right TKR by DrWainer 4/04...  LOW BACK PAIN SYNDROME (ICD-724.2) - states 2 ruptured dics per DrGioffre... and DrJJenkins told her pinched nerve in her buttocks x yrs (no surgery) NECK PAIN (ICD-723.1)  HEADACHE (ICD-784.0)  GYN = DrLomax & last seen 12/08...      Current Allergies (reviewed today): ! PENICILLIN ! CODEINE  Past  Medical History:        SINUSITIS, ACUTE (ICD-461.9)    HYPERTENSION (ICD-401.9)    Hx of TACHYARRHYTHMIA (ICD-785.0)    Hx of DEEP VENOUS THROMBOPHLEBITIS (ICD-453.40)    HYPERLIPIDEMIA (ICD-272.4)    GERD (ICD-530.81)    DIVERTICULOSIS OF COLON (ICD-562.10)    COLONIC POLYPS (ICD-211.3)    Hx of RENAL CALCULUS (ICD-592.0)    DEGENERATIVE JOINT DISEASE (ICD-715.90)    LOW BACK PAIN SYNDROME (ICD-724.2)    NECK PAIN (ICD-723.1)    HEADACHE (ICD-784.0)      Past Surgical History:    S/P T & A    S/P nasal surgery in past    S/P appendectomy     S/P breast cyst removal    S/P hemorroid surgery    S/P ELap for SBO w/ lysis of adhesions 1984    S/P left knee surg in 1997    S/P right TKR in 2004 by DrWainer     Review of Systems  The patient denies anorexia, fever, weight loss, weight gain, vision loss, decreased hearing, hoarseness, chest pain, syncope, peripheral edema, prolonged cough, hemoptysis, abdominal pain, melena, hematochezia, severe indigestion/heartburn, hematuria, incontinence, muscle weakness, suspicious skin lesions, transient blindness, difficulty walking, depression, unusual weight change, abnormal bleeding, enlarged lymph nodes, and angioedema.     Vital Signs:  Patient Profile:   77 Years Old Female Weight:      168.38 pounds O2 Sat:      97 % O2 treatment:    Room Air Temp:     97.3 degrees F oral Pulse rate:   67 / minute BP sitting:  118 / 70  (left arm)  Vitals Entered By: Marijo File CMA (August 07, 2007 11:51 AM)                 Physical Exam  WD, WN, 77 y/o WF in NAD... GENERAL:  Alert & oriented; pleasant & cooperative... HEENT:  [place]/AT, EOM-wnl, PERRLA, EACs-clear, TMs-wnl, NOSE- sl red, THROAT- no exud seen... NECK:  Supple w/ decr ROM; no JVD; normal carotid impulses w/o bruits; no thyromegaly or nodules palpated; no lymphadenopathy. CHEST:  Clear to P & A; without wheezes/ rales/ or rhonchi. HEART:  Regular Rhythm; msc gr 1/6  SEM without rubs or gallops heard... ABDOMEN:  Soft & nontender; normal bowel sounds; no organomegaly or masses detected. EXT: without deformities, mild arthritic changes; no varicose veins/ +venous insuffic/ no edema. NEURO:  CN's intact;  no focal neuro deficit... DERM:  No lesions noted; no rash etc...       Impression & Recommendations:  Problem # 1:  HEADACHE (ICD-784.0) Assessment: New She want spercocet for the pain... OK.  We need to proceed w/ eval including:  MRI/ MRA, CSpine films, Rx for sinusitis...   Her updated medication list for this problem includes:    Adult Aspirin Low Strength 81 Mg Tbdp (Aspirin) .Marland Kitchen... Take 1 tablet by mouth once a day    Lopressor 50 Mg Tabs (Metoprolol tartrate) .Marland Kitchen... Take 1/2 tablet by mouth every morning and take 1/2 tablet by mouth every evening    Oxycodone-acetaminophen 5-325 Mg Tabs (Oxycodone-acetaminophen) .Marland Kitchen... 1 tab by mouth every 6-8 hours as needed for pain...  Orders: Radiology Referral (Radiology)   Problem # 2:  NECK PAIN (ICD-723.1) Assessment: New I suspect some/ most of her pain is cervicogenic... check films and eval further from that point... Her updated medication list for this problem includes:    Adult Aspirin Low Strength 81 Mg Tbdp (Aspirin) .Marland Kitchen... Take 1 tablet by mouth once a day    Oxycodone-acetaminophen 5-325 Mg Tabs (Oxycodone-acetaminophen) .Marland Kitchen... 1 tab by mouth every 6-8 hours as needed for pain...  Orders: T-Cervical Spine Comp 4 Views [phone]) Radiology Referral (Radiology)   Problem # 3:  SINUSITIS, ACUTE (ICD-461.9) Assessment: New We will Rx w/ Avelox, saline, Nasonex... may need ENT eval...  Her updated medication list for this problem includes:    Avelox 400 Mg Tabs (Moxifloxacin hcl) .Marland Kitchen... 1 tab by mouth once daily til gone...    Nasonex 50 Mcg/act Susp (Mometasone furoate) .Marland Kitchen... 2 sp in each nostril at bedtime...  Orders: ENT Referral (ENT)   Problem # 4:  HYPERTENSION  (ICD-401.9) Assessment: Unchanged Controlled... same meds. Her updated medication list for this problem includes:    Lopressor 50 Mg Tabs (Metoprolol tartrate) .Marland Kitchen... Take 1/2 tablet by mouth every morning and take 1/2 tablet by mouth every evening  Orders: TLB-Creatinine, Blood (82565-CREA) TLB-BUN (Urea Nitrogen) (84520-BUN)   Problem # 5:  HYPERLIPIDEMIA (ICD-272.4) She will need f/u FASTING labs later... Her updated medication list for this problem includes:    Lipitor 20 Mg Tabs (Atorvastatin calcium) .Marland Kitchen... Take 1/2 tablet by mouth once daily   Complete Medication List: 1)  Adult Aspirin Low Strength 81 Mg Tbdp (Aspirin) .... Take 1 tablet by mouth once a day 2)  Lopressor 50 Mg Tabs (Metoprolol tartrate) .... Take 1/2 tablet by mouth every morning and take 1/2 tablet by mouth every evening 3)  Lipitor 20 Mg Tabs (Atorvastatin calcium) .... Take 1/2 tablet by mouth once daily 4)  Aciphex 20 Mg Tbec (  Rabeprazole sodium) .... Take one tablet by mouth once daily as needed 5)  Vivelle-dot 0.1 Mg/24hr Pttw (Estradiol) .... Use one patch two times a week 6)  Vitamin D3 1000 Unit Tabs (Cholecalciferol) .... Take 2 tablets by mouth once daily 7)  Centrum Silver Tabs (Multiple vitamins-minerals) .... Take 1 tablet by mouth once a day 8)  Avelox 400 Mg Tabs (Moxifloxacin hcl) .Marland Kitchen.. 1 tab by mouth once daily til gone... 9)  Nasonex 50 Mcg/act Susp (Mometasone furoate) .... 2 sp in each nostril at bedtime... 10)  Oxycodone-acetaminophen 5-325 Mg Tabs (Oxycodone-acetaminophen) .Marland Kitchen.. 1 tab by mouth every 6-8 hours as needed for pain...   Patient Instructions: 1)  Today we planned an evaluation for your headaches and neck pain... First- XRay CSpine... Second- MRI of your brain... Treatment- try the Oxycodone every 6-8H as needed for the severe pain... you may also use a heating pad on your neck.Marland KitchenMarland Kitchen 2)  Next we planned an evaluation for your sinuses and difficulty breathing from your nose... First-  use a SALINE NASAL SPRAY every 1-2 hours while awake... Second- start the NASONEX 2 sprays in each nostril at bedtime... Third- perscription for AVELOX (antibiotic) one pill per day til gone... Fourth- ENT evaluation will be scheduled for you.    Prescriptions: OXYCODONE-ACETAMINOPHEN 5-325 MG  TABS (OXYCODONE-ACETAMINOPHEN) 1 tab by mouth every 6-8 hours as needed for pain...  #100 x 0   Entered and Authorized by:   Michele Mcalpine MD   Signed by:   Michele Mcalpine MD on 08/07/2007   Method used:   Print then Give to Patient   RxID:   [id number] NASONEX 50 MCG/ACT  SUSP (MOMETASONE FUROATE) 2 sp in each nostril at bedtime...  #1 x prn   Entered and Authorized by:   Michele Mcalpine MD   Signed by:   Michele Mcalpine MD on 08/07/2007   Method used:   Print then Give to Patient   RxID:   [id number] AVELOX 400 MG  TABS (MOXIFLOXACIN HCL) 1 tab by mouth once daily til gone...  #5 x 1   Entered and Authorized by:   Michele Mcalpine MD   Signed by:   Michele Mcalpine MD on 08/07/2007   Method used:   Print then Give to Patient   RxID:   [id number]  ]

## 2010-06-11 NOTE — Assessment & Plan Note (Signed)
Summary: 3 month rov/sl   Primary Provider:  Alroy Dust, MD  CC:  3 month rov.  History of Present Illness:  Samantha Bishop is seen in followup for atrial fibrillation associated with symptomatic palpitations. She is a CHADS VASC score of 4 and is on Pradaxa. She is tolerating without symptoms.  echo cardiogram last fall demonstrated normal left ventricular function.      her biggest problem has been ongoing infection in her right lower leg.  She also does express concern about her medications specifically the cause of her Lipitor          Current Medications (verified): 1)  Adult Aspirin Low Strength 81 Mg  Tbdp (Aspirin) .... Take 1 Tablet By Mouth Once A Day 2)  Lopressor 50 Mg Tabs (Metoprolol Tartrate) .... Take 1/2 Tablet By Mouth Every Morning and Take 1/2 Tablet By Mouth Every Evening 3)  Lipitor 20 Mg Tabs (Atorvastatin Calcium) .... Take 1/2 Tablet By Mouth Once Daily 4)  Vitamin D3 1000 Unit  Tabs (Cholecalciferol) .... Take 2 Tablets By Mouth Once Daily 5)  Ambien 10 Mg  Tabs (Zolpidem Tartrate) .Marland Kitchen.. 1 By Mouth At Bedtime As Needed For Sleep 6)  Fish Oil 1000 Mg Caps (Omega-3 Fatty Acids) .... 2 Caps Once Daily 7)  Pradaxa 150 Mg Tabs .... Take Two Times A Day 8)  Colace 100 Mg Caps (Docusate Sodium) .... Take 1 Capsule By Mouth Three Times A Day X7days 9)  Oxycodone-Acetaminophen 5-325 Mg Tabs (Oxycodone-Acetaminophen) .Marland Kitchen.. 1 Every 6 Hours As Needed Pain 10)  Naproxen 250 Mg Tabs (Naproxen) .... Take 1-2 Tabs By Mouth Two Times A Day As Needed For Pain... 11)  Famotidine 20 Mg Tabs (Famotidine) .... Take 1 Tablet By Mouth Two Times A Day X7days 12)  Proctosol Hc 2.5 % Crea (Hydrocortisone) .... Apply To Rectal Area After Each Bm & At Bedtime... 13)  Doxycycline Hyclate 100 Mg Caps (Doxycycline Hyclate) .Marland Kitchen.. 1 By Mouth Two Times A Day For 10 Days 14)  Furosemide 20 Mg Tabs (Furosemide) .... One By Mouth Once Daily 15)  Keflex 500 Mg Caps (Cephalexin) .Marland Kitchen.. 1 Tablet 4 Times  Daily  Allergies (verified): 1)  ! Penicillin 2)  ! Codeine  Past History:  Past Surgical History: Last updated: 05/21/2009  S/P T & A S/P nasal surgery in past S/P appendectomy  S/P hysterectomy S/P breast cyst removal S/P hemorroid surgery x 2 S/P ELap for SBO w/ lysis of adhesions 1984 S/P left knee surg in 1997 S/P right TKR in 2004 by Dr Thurston Hole  Family History: Last updated: 04/26/2009 Mother-deceased, MI age 79 Father-deceased, MI age 11 Brother deceased alcohol cirrhosis  Social History: Last updated: 03/03/2009 No alcohol, tobacco drugs Divorced 3 children, 4 grandchildren, 2 great grandchildren Worked in cigarette factory  Past Medical History: s/p avnrt ablation and cristal tachycardia atrial fibrillation Hypertension  Vital Signs:  Patient profile:   77 year old female Height:      66 inches Weight:      177 pounds BMI:     28.67 Pulse rate:   63 / minute Pulse rhythm:   regular BP sitting:   140 / 72  (right arm) Cuff size:   regular  Vitals Entered By: Judithe Modest CMA (June 24, 2009 11:07 AM)  Physical Exam  General:  The patient was alert and oriented in no acute distress. HEENT Normal.  Neck veins were flat, carotids were brisk.  Lungs were clear.  Heart sounds were regular without  murmurs or gallops.  Abdomen was soft with active bowel sounds. There is no clubbing cyanosis or edema.There is erythema of her right posterior lower leg Skin Warm and dry neurological exam is grossly normal   EKG  Procedure date:  06/24/2009  Findings:      sinus rhythm at 63 Intervals 0.16/0.08/24 one Axis XXI Otherwise normal  Impression & Recommendations:  Problem # 1:  ATRIAL FIBRILLATION (ICD-427.31) She is having no symptomatic palpitations at this time. She will continue taking Pradaxa for thromboembolic risk reduction; we will discontinue her aspirin The following medications were removed from the medication list:    Adult  Aspirin Low Strength 81 Mg Tbdp (Aspirin) .Marland Kitchen... Take 1 tablet by mouth once a day Her updated medication list for this problem includes:    Lopressor 50 Mg Tabs (Metoprolol tartrate) .Marland Kitchen... Take 1/2 tablet by mouth every morning and take 1/2 tablet by mouth every evening  Orders: EKG w/ Interpretation (93000)  Problem # 2:  HYPERLIPIDEMIA (ICD-272.4) she finds the Lipitor very extensive. We'll put her on simvastatin 20. I will defer followup to Dr. Kriste Basque Her updated medication list for this problem includes:    Simvastatin 20 Mg Tabs (Simvastatin) .Marland Kitchen... Take one tablet by mouth daily at bedtime  Patient Instructions: 1)  Your physician recommends that you schedule a follow-up appointment in: 6 months 2)  Your physician has recommended you make the following change in your medication: STOP ASPIRIN 3)  START SIMVASTATIN 20MG  ONCE DAILY AT BEDTIME Prescriptions: SIMVASTATIN 20 MG TABS (SIMVASTATIN) Take one tablet by mouth daily at bedtime  #30 x 12   Entered by:   Deliah Goody, RN   Authorized by:   Nathen May, MD, [facility]   Signed by:   Deliah Goody, RN on 06/24/2009   Method used:   [facility]        [facility] [address] [phone][facility] (retail)       [address]       [place], [place]  [address]       Ph: [phone] or [phone]       Fax: [phone]   RxID:   [id number]

## 2010-06-11 NOTE — Consult Note (Signed)
Summary: [facility]  [facility]   Imported By: Florinda Marker 10/22/2009 15:42:28  _____________________________________________________________________  External Attachment:    Type:   Image     Comment:   External Document

## 2010-06-11 NOTE — Letter (Signed)
Summary: Neck pain/[facility]  Neck pain/[facility]   Imported By: Sherian Rein 01/28/2009 07:55:46  _____________________________________________________________________  External Attachment:    Type:   Image     Comment:   External Document

## 2010-06-11 NOTE — Consult Note (Signed)
Summary: dysuria/[facility]  dysuria/[facility]   Imported By: Lester Taft 12/29/2007 09:30:45  _____________________________________________________________________  External Attachment:    Type:   Image     Comment:   External Document

## 2010-06-11 NOTE — Letter (Signed)
[facility]: [facility]   Imported By: Lester Basalt 05/21/2008 10:47:19  _____________________________________________________________________  External Attachment:    Type:   Image     Comment:   External Document

## 2010-06-11 NOTE — Assessment & Plan Note (Signed)
Summary: c/o svt last seen by klein  Medications Added NASONEX 50 MCG/ACT  SUSP (MOMETASONE FUROATE) as needed LOPRESSOR 50 MG TABS (METOPROLOL TARTRATE) take 1/2 tablet by mouth every morning and take 1/2 tablet by mouth every evening FISH OIL 1000 MG CAPS (OMEGA-3 FATTY ACIDS) 2 caps once daily        Visit Type:  DOD ADD-ON Primary Provider:  Alroy Dust, MD  CC:  pt states she was dizzy last Tuesday to last Thursday..says she is doing better now with that but is having a headache..last saw Dr. Glennon Hamilton in 2008.  History of Present Illness: 77 yo WF with history of AV nodal reentrant tachycardia s/p ablation 2003 as well as PMH outlined below here today as add on with compaint of palpitations. She tells me that she woke up yesterday morning and noticed that her heart was racing. She felt weak but denies any associated chest pain, SOB or dizziness. She took an extra Lopressor and ASA and after five minutes. She describes some dizziness last week but did not notice her heart racing then. She continues to have lightheadedness today. She has had some atypical chest pain at rest which she describes as sharp, no radiation and no associated SOB or diaphoresis.  T Current Medications (verified): 1)  Nasonex 50 Mcg/act  Susp (Mometasone Furoate) .... As Needed 2)  Adult Aspirin Low Strength 81 Mg  Tbdp (Aspirin) .... Take 1 Tablet By Mouth Once A Day 3)  Lopressor 50 Mg Tabs (Metoprolol Tartrate) .... Take 1/2 Tablet By Mouth Every Morning and Take 1/2 Tablet By Mouth Every Evening 4)  Lipitor 20 Mg Tabs (Atorvastatin Calcium) .... Take 1/2 Tablet By Mouth Once Daily 5)  Vivelle-Dot 0.1 Mg/24hr  Pttw (Estradiol) .... Use One Patch Two Times A Week 6)  Centrum Silver   Tabs (Multiple Vitamins-Minerals) .... Take 1 Tablet By Mouth Once A Day 7)  Vitamin D3 1000 Unit  Tabs (Cholecalciferol) .... Take 2 Tablets By Mouth Once Daily 8)  Ambien 10 Mg  Tabs (Zolpidem Tartrate) .Marland Kitchen.. 1 By Mouth At  Bedtime As Needed For Sleep 9)  Hydrocodone-Acetaminophen 5-500 Mg Tabs (Hydrocodone-Acetaminophen) .... Take 1/2 To One Tablet By Mouth Every 6 Hours As Needed For Pain... 10)  Fish Oil 1000 Mg Caps (Omega-3 Fatty Acids) .... 2 Caps Once Daily  Allergies: 1)  ! Penicillin 2)  ! Codeine  Past History:  Past Medical History:  SINUSITIS, ACUTE (ICD-461.9) HYPERTENSION (ICD-401.9) Hx of TACHYARRHYTHMIA (ICD-785.0) Hx of DEEP VENOUS THROMBOPHLEBITIS (ICD-453.40) with PE HYPERLIPIDEMIA (ICD-272.4) GERD (ICD-530.81) DIVERTICULOSIS OF COLON (ICD-562.10) COLONIC POLYPS (ICD-211.3) Hx of RENAL CALCULUS (ICD-592.0) DEGENERATIVE JOINT DISEASE (ICD-715.90) LOW BACK PAIN SYNDROME (ICD-724.2) NECK PAIN (ICD-723.1) HEADACHE (ICD-784.0) Cerebral aneurysm  Past Surgical History: S/P T & A S/P nasal surgery in past S/P appendectomy  S/P hysterectomy S/P breast cyst removal S/P hemorroid surgery x 2 S/P ELap for SBO w/ lysis of adhesions 1984 S/P left knee surg in 1997 S/P right TKR in 2004 by Dr Thurston Hole  Family History: Mother-deceased, MI age 25 Father-deceased, MI age 53 Brother deceased alcohol cirrhosis  Social History: No alcohol, tobacco drugs Divorced 3 children, 4 grandchildren, 2 great grandchildren Worked in cigarette factory  Review of Systems       The patient complains of fatigue, chest pain, palpitations, and dizziness.  The patient denies malaise, fever, weight gain/loss, shortness of breath, prolonged cough, wheezing, coughing up blood, abdominal pain, blood in stool, nausea, vomiting, diarrhea, heartburn, incontinence, blood in  urine, muscle weakness, joint pain, leg swelling, rash, skin lesions, headache, fainting, depression, anxiety, enlarged lymph nodes, easy bruising or bleeding, and environmental allergies.    Vital Signs:  Patient profile:   77 year old female Height:      66 inches Weight:      174 pounds BMI:     28.19 Pulse rate:   66 / minute Pulse  rhythm:   regular BP sitting:   114 / 70  (left arm) Cuff size:   large  Vitals Entered By: Danielle Rankin, CMA (March 03, 2009 9:27 AM)  Physical Exam  General:  General: Well developed, well nourished, NAD HEENT: OP clear, mucus membranes moist SKIN: warm, dry Neuro: No focal deficits Musculoskeletal: Muscle strength 5/5 all ext Psychiatric: Mood and affect normal Neck: No JVD, no carotid bruits, no thyromegaly, no lymphadenopathy. Lungs:Clear bilaterally, no wheezes, rhonci, crackles CV: RRR no murmurs, gallops rubs Abdomen: soft, NT, ND, BS present Extremities: No edema, pulses 2+.    EKG  Procedure date:  03/03/2009  Findings:      NSR, rate 66 bpm. Normal  Impression & Recommendations:  Problem # 1:  Hx of TACHYARRHYTHMIA (ICD-785.0) Pt with palpitations yesterday morning. H/P AVNRT s/p ablation. She is in sinus rhythm today with normal BP. Will arrange 21 day event monitor. Check echo to evaluate LV function. She will see Dr. Graciela Husbands in follow up as he has been involved in her care in the past. She was followed by Dr. Corinda Gubler prior to his retirement.   Problem # 2:  DIZZINESS (ICD-780.4)  Will check carotid artery dopplers.   Orders: Echocardiogram (Echo) Carotid Duplex (Carotid Duplex)  Other Orders: EKG w/ Interpretation (93000) Event (Event)  Patient Instructions: 1)  You have an appointment with Dr. Graciela Husbands on March 28, 2009 at 3:30 2)  Your physician recommends that you continue on your current medications as directed. Please refer to the Current Medication list given to you today. 3)  Your physician has requested that you have a carotid duplex. This test is an ultrasound of the carotid arteries in your neck. It looks at blood flow through these arteries that supply the brain with blood. Allow one hour for this exam. There are no restrictions or special instructions. 4)  Your physician has requested that you have an echocardiogram.  Echocardiography is a  painless test that uses sound waves to create images of your heart. It provides your doctor with information about the size and shape of your heart and how well your heart's chambers and valves are working.  This procedure takes approximately one hour. There are no restrictions for this procedure. 5)  Your physician has recommended that you wear an event monitor.  Event monitors are medical devices that record the heart's electrical activity. Doctors most often use these monitors to diagnose arrhythmias. Arrhythmias are problems with the speed or rhythm of the heartbeat. The monitor is a small, portable device. You can wear one while you do your normal daily activities. This is usually used to diagnose what is causing palpitations/syncope (passing out).

## 2010-06-11 NOTE — Medication Information (Signed)
Summary: Nasonex/[facility]  [facility]   Imported By: Lester [place] 01/09/2008 09:07:14  _____________________________________________________________________  External Attachment:    Type:   Image     Comment:   External Document

## 2010-06-11 NOTE — Assessment & Plan Note (Signed)
Summary: F6M/DM   Primary Provider:  Alroy Dust, MD   History of Present Illness:  Samantha Bishop is seen in followup for atrial fibrillation associated with symptomatic palpitations. She is a CHADS VASC score of 4 and is on Pradaxa. She is tolerating without symptoms.  echo cardiogram last fall demonstrated normal left ventricular function.     Her major concern is fungal infection of her nails. She was told by her podiatrist that this can be related to Lopressor. She requested permission from Dr. Kriste Basque to stop her Lopressor; this was denied.           Current Medications (verified): 1)  Pradaxa 150 Mg Tabs .... Take Two Times A Day 2)  Lopressor 50 Mg Tabs (Metoprolol Tartrate) .... Take 1/2 Tablet By Mouth Every Morning and Take 1/2 Tablet By Mouth Every Evening 3)  Simvastatin 20 Mg Tabs (Simvastatin) .... Take One Tablet By Mouth Daily At Bedtime 4)  Fish Oil 1000 Mg Caps (Omega-3 Fatty Acids) .... 2 Caps Once Daily 5)  Proctosol Hc 2.5 % Crea (Hydrocortisone) .... Apply To Rectal Area After Each Bm & At Bedtime.Marland KitchenMarland Kitchen 6)  Vivelle-Dot 0.05 Mg/24hr Pttw (Estradiol) .... Use 2 Times Weekly 7)  Vitamin D3 1000 Unit  Tabs (Cholecalciferol) .... Take 2 Tablets By Mouth Once Daily 8)  Temazepam 15 Mg Caps (Temazepam) .... Take One Capsule By Mouth At Bedtime 9)  Keflex 500 Mg Caps (Cephalexin) .... One By Mouth Once Daily 10)  Drysol 20 % Soln (Aluminum Chloride) .... Apply At Bedtime  Allergies (verified): 1)  ! Penicillin 2)  ! Codeine  Past History:  Past Medical History: Last updated: 08/19/2009  HYPERTENSION, BORDERLINE (ICD-401.9) ATRIAL FIBRILLATION (ICD-427.31) Hx of TACHYARRHYTHMIA (ICD-785.0) Hx of DEEP VENOUS THROMBOPHLEBITIS (ICD-453.40) HYPERLIPIDEMIA (ICD-272.4) GERD (ICD-530.81) DIVERTICULOSIS OF COLON (ICD-562.10) COLONIC POLYPS (ICD-211.3) HEMORRHOIDS (ICD-455.6) Hx of RENAL CALCULUS (ICD-592.0) DEGENERATIVE JOINT DISEASE (ICD-715.90) NECK PAIN  (ICD-723.1) LOW BACK PAIN SYNDROME (ICD-724.2) Hx of CELLULITIS, ANKLE (ICD-682.6) HEADACHE (ICD-784.0) INTRACRANIAL ANEURYSM (ICD-437.3) ANXIETY (ICD-300.00) INSOMNIA, CHRONIC (ICD-307.42)  Vital Signs:  Patient profile:   77 year old female Height:      68 inches Weight:      166 pounds BMI:     25.33 Pulse rate:   56 / minute Resp:     16 per minute BP sitting:   140 / 74  (left arm)  Vitals Entered By: Marrion Coy, CNA (December 23, 2009 2:03 PM)  Physical Exam  General:  The patient was alert and oriented in no acute distress. HEENT Normal.  Neck veins were flat, carotids were brisk.  Lungs were clear.  Heart sounds were regular without murmurs or gallops.  Abdomen was soft with active bowel sounds. There is no clubbing cyanosis or edema. Skin Warm and dry Nails appear to be infected with a fungus   EKG  Procedure date:  12/23/2009  Findings:      sinus a t 59 .17/.08/.42 o/w normal  Impression & Recommendations:  Problem # 1:  ATRIAL FIBRILLATION (ICD-427.31) stable and will continue her pradaxa  Problem # 2:  ONYCHOMYCOSIS (ICD-110.1) this has been ascribed by her podiatrist to Lopressor; I don't it is any problem in stopping this. We'll begin her on diltiazem 120 daily.  Patient Instructions: 1)  Your physician recommends that you schedule a follow-up appointment in: YEAR WITH DR Graciela Husbands 2)  Your physician has recommended you make the following change in your medication:START  DILTIAZEM 120 MG 1 once daily STOP LOPRESSOR Prescriptions:  DILTIAZEM HCL ER BEADS 120 MG XR24H-CAP (DILTIAZEM HCL ER BEADS) 1 once daily  #30 x 11   Entered by:   Scherrie Bateman, LPN   Authorized by:   Nathen May, MD, [facility]   Signed by:   Scherrie Bateman, LPN on 29/56/2130   Method used:   [facility] to        [facility] [address] [phone]* (retail)       [address]       [place], [place]  [address]       Ph: [phone] or [phone]        Fax: [phone]   RxID:   [id number]

## 2010-06-11 NOTE — Progress Notes (Signed)
Summary: ? recall colon  Phone Note Outgoing Call   Call placed by: Darcey Nora RN, CGRN,  May 21, 2010 4:06 PM Call placed to: Patient Summary of Call: I spoke with the patient about scheduling a routine follow up colon.  Patient is currently on Pradaxa.  She is not sure that she is interested in persuing at this time.  She is given the number to our office and she will call back to schedule an office visit with Dr Russella Dar if she wants to persue. Initial call taken by: Darcey Nora RN, CGRN,  May 21, 2010 4:07 PM  Follow-up for Phone Call        Given age and Pradaxa useage it is reasonable to cancel colonoscopy recalls.  Follow-up by: Meryl Dare MD FACG,  May 21, 2010 8:34 PM  Additional Follow-up for Phone Call Additional follow up Details #1::        recall canceled in IDX will see if patient calls back. Additional Follow-up by: Darcey Nora RN, CGRN,  May 22, 2010 7:27 AM

## 2010-06-11 NOTE — Letter (Signed)
Summary: [facility]  [facility]   Imported By: Sherian Rein 12/22/2009 14:07:45  _____________________________________________________________________  External Attachment:    Type:   Image     Comment:   External Document

## 2010-06-11 NOTE — Progress Notes (Signed)
Summary: results  Phone Note Call from Patient Call back at Home Phone [phone]   Caller: Patient Call For: Aliviana Burdell Summary of Call: calling for lab and xray results Initial call taken by: Lacinda Axon,  August 15, 2007 12:30 PM  Follow-up for Phone Call        PT CALLED BACK AGAIN...VERY URGENT THAT SHE KNOW RESUKTS...TIRED OF WAITING TO FIND OUT WHAT IS WRONG W/HER.   pt requesting test results.  results are unsigned.  please let us know what you want [place] to tell the pt.  thank you.   ..................................................................Marland KitchenCyndia Diver LPN  August 14, 4780 4:47 PM  Follow-up by: Eugene Gavia,  August 15, 2007 3:59 PM  Additional Follow-up for Phone Call Additional follow up Details #1::        pt notified... we will set up appt w/ neurology...  SN Additional Follow-up by: Michele Mcalpine MD,  August 15, 2007 6:50 PM         Appended Document: results Appt with [facility] Scheduled with Dr. Orlin Hilding for 08/18/07 at 10:30. Pt to arrive at 10:00. Pt informed of appt by Diane.

## 2010-06-11 NOTE — Assessment & Plan Note (Signed)
Summary: per paula f/u pt. come in @ 11am [mkj]   Primary Provider:  Alroy Dust, MD  CC:  follow-up visit.  History of Present Illness: 77 yo wf with mmp whom I follow in  referral from Dr Thurston Hole for RLE cellulitis.  This began in 2010-04-22  when R foot became tender and swollen.  Went to ED and got abx. Given abx again by Dr Enis Slipper in Nov but no benefit.   Went to see Dr Thurston Hole in Dec and admitted for 3 days and got IV abx and then d/ced on bactrim for 10 days which helped.  Had dx of superficial thrombophlebitis at that time.  she saw Dr Kriste Basque after and told her leg was ok.  Dr Thurston Hole saw pt and rec pt see ID for eval.  Currenlty she has been stable on keflex three times a day but has occas deep sharp shoting pain down around her lower leg.  The area is somewhat indurated but the swelling is controlled with the compresion stockings.  No fevers chills.  Has a R TKR 7 yrs ago.  Has a h/o blood clot in LLE after knee arthroscopy 7 yrs ago.  Also had leg fx on R 2003.  No history of open wounds or ulcers on legs.    Preventive Screening-Counseling & Management  Alcohol-Tobacco     Alcohol drinks/day: 0     Smoking Status: never  Caffeine-Diet-Exercise     Caffeine use/day: 0     Does Patient Exercise: no  Safety-Violence-Falls     Seat Belt Use: yes   Updated Prior Medication List: * PRADAXA 150 MG TABS TAKE two times a day LOPRESSOR 50 MG TABS (METOPROLOL TARTRATE) take 1/2 tablet by mouth every morning and take 1/2 tablet by mouth every evening SIMVASTATIN 20 MG TABS (SIMVASTATIN) Take one tablet by mouth daily at bedtime FISH OIL 1000 MG CAPS (OMEGA-3 FATTY ACIDS) 2 caps once daily COLACE 100 MG CAPS (DOCUSATE SODIUM) Take 1 capsule by mouth three times a day x7days PROCTOSOL HC 2.5 % CREA (HYDROCORTISONE) apply to rectal area after each BM & at bedtime... VIVELLE-DOT 0.05 MG/24HR PTTW (ESTRADIOL) use 2 times weekly NAPROXEN 250 MG TABS (NAPROXEN) take 1-2 tabs by  mouth two times a day as needed for pain... VITAMIN D3 1000 UNIT  TABS (CHOLECALCIFEROL) take 2 tablets by mouth once daily TEMAZEPAM 30 MG CAPS (TEMAZEPAM) take 1 cap by mouth at bedtime as needed for sleep... KEFLEX 500 MG CAPS (CEPHALEXIN) 1 tablet three times a day DRYSOL 20 % SOLN (ALUMINUM CHLORIDE) APPLY AT BEDTIME  Current Allergies (reviewed today): ! PENICILLIN ! CODEINE Past History:  Past Medical History: Last updated: 08/19/2009  HYPERTENSION, BORDERLINE (ICD-401.9) ATRIAL FIBRILLATION (ICD-427.31) Hx of TACHYARRHYTHMIA (ICD-785.0) Hx of DEEP VENOUS THROMBOPHLEBITIS (ICD-453.40) HYPERLIPIDEMIA (ICD-272.4) GERD (ICD-530.81) DIVERTICULOSIS OF COLON (ICD-562.10) COLONIC POLYPS (ICD-211.3) HEMORRHOIDS (ICD-455.6) Hx of RENAL CALCULUS (ICD-592.0) DEGENERATIVE JOINT DISEASE (ICD-715.90) NECK PAIN (ICD-723.1) LOW BACK PAIN SYNDROME (ICD-724.2) Hx of CELLULITIS, ANKLE (ICD-682.6) HEADACHE (ICD-784.0) INTRACRANIAL ANEURYSM (ICD-437.3) ANXIETY (ICD-300.00) INSOMNIA, CHRONIC (ICD-307.42)  Past Surgical History: Last updated: 08/19/2009  S/P T & A S/P nasal surgery in past S/P appendectomy  S/P hysterectomy S/P breast cyst removal S/P hemorroid surgery x 2 S/P ELap for SBO w/ lysis of adhesions 1984 S/P left knee surg in 1997 S/P right TKR in 2004 by Dr Thurston Hole  Family History: Last updated: April 22, 2009 Mother-deceased, MI age 85 Father-deceased, MI age 50 Brother deceased alcohol cirrhosis  Social History:  Last updated: 03/03/2009 No alcohol, tobacco drugs Divorced 3 children, 4 grandchildren, 2 great grandchildren Worked in cigarette factory  Risk Factors: Alcohol Use: 0 (09/04/2009) Caffeine Use: 0 (09/04/2009) Exercise: no (09/04/2009)  Risk Factors: Smoking Status: never (09/04/2009)  Review of Systems       11 systems reviewed and negative except per HPI   Vital Signs:  Patient profile:   77 year old female Height:      68 inches (172.72  cm) Weight:      167.12 pounds (75.96 kg) BMI:     25.50 Temp:     98.0 degrees F (36.67 degrees C) oral Pulse rate:   63 / minute BP sitting:   121 / 72  (left arm)  Vitals Entered By: Baxter Hire) (September 04, 2009 11:48 AM) CC: follow-up visit Pain Assessment Patient in pain? yes     Location: right leg Intensity: 4 Type: aching/stinging Onset of pain  pain comes and goes Nutritional Status BMI of 25 - 29 = overweight Nutritional Status Detail appetite is too good per patient  Does patient need assistance? Functional Status Self care Ambulation Normal   Physical Exam  General:  alert and well-developed.   Head:  normocephalic.   Mouth:  fair dentition.   Neck:  supple.   Lungs:  normal respiratory effort and normal breath sounds.   Heart:  normal rate and regular rhythm.   Abdomen:  soft and non-tender.   Msk:  normal ROM.   Extremities:  RLE with trace  pitting edema to mid calf.There is a knotty induration po The chronci venous stasis changes are much improved.  No open sores.    LLE 1+ edema around ankles.    Impression & Recommendations:  Problem # 1:  Hx of CELLULITIS, ANKLE (ICD-682.6) This is stable and will decrease to once daily keflex.   WIll continue compression stockings and wraps and elevation  Orders: Est. Patient Level IV ([address]) Vascular Clinic (Vascular)  Problem # 2:  CHRONIC VENOUS HYPERTENSION WITHOUT COMPS (ICD-459.30) Will also refer to Dr Jimmie Molly in vascular clinic given the chronicity of her venous stasis to see if there is any possible intervention he would recommend. Orders: Est. Patient Level IV ([address]) Vascular Clinic (Vascular)  Medications Added to Medication List This Visit: 1)  Keflex 500 Mg Caps (Cephalexin) .... One by mouth once daily  Patient Instructions: 1)  Followup at end of month. 2)  Decrease keflex to once a day. 3)  We will refer you to Vein specialist Prescriptions: KEFLEX 500 MG CAPS  (CEPHALEXIN) one by mouth once daily  #30 x 11   Entered and Authorized by:   Clydie Braun MD   Signed by:   Clydie Braun MD on 09/09/2009   Method used:   [facility] to        [facility] [address] [phone]* (retail)       [address]       [place], [place]  [address]       Ph: [phone] or [phone]       Fax: [phone]   RxID:   [id number]

## 2010-06-11 NOTE — Letter (Signed)
Summary: Colonoscopy-Changed to Office Visit Letter  [facility]  [address] [place], [place] [address]   Phone: [phone]  Fax: [phone]      May 27, 2010 MRN: [id number]   Samantha Bishop [address] [place], [place]  [address]   Dear Samantha Bishop,   According to our records, it is time for you to schedule a Colonoscopy. However, after reviewing your medical record, I feel that an office visit would be most appropriate to more completely evaluate you and determine your need for a repeat procedure.  Please call [phone] (option #2) at your convenience to schedule an office visit. If you have any questions, concerns, or feel that this letter is in error, we would appreciate your call.   Sincerely,  Judie Petit T. Russella Dar, M.D.  [facility] [phone]

## 2010-06-11 NOTE — Assessment & Plan Note (Signed)
Summary: 1XB F/U/VS   Primary Provider:  Alroy Dust, MD  CC:  1 week follow up.  History of Present Illness: 77 yo wf with mmp herre for referral from Dr Thurston Hole for RLE cellulitis.  Began in nov 19 when R foot became tender and swollen.  Went to ED and got abx. Given abx again by Dr Enis Slipper in Nov but no benefit.   Went to see Dr Thurston Hole in Dec and admitted for 3 days and got IV abx and then d/ced on bactrim for 10 days which helped.  Had dx of superficial thrombophlebitis at that time.  He saw Dr Kriste Basque a few days ago and told her leg was ok.  Dr Thurston Hole saw pt yesterday and rec pt see ID for eval.  Has a R TKR 7 yrs ago.  Has a h/o blood clot in LLE after knee arthroscopy 7 yrs ago.  Also had leg fx on R 2003.  No history of open wounds or ulcers on legs.  Saw Lymphedema clinic and will see today for a wrap  Preventive Screening-Counseling & Management  Alcohol-Tobacco     Alcohol drinks/day: 0     Smoking Status: never  Caffeine-Diet-Exercise     Caffeine use/day: 0     Does Patient Exercise: no  Safety-Violence-Falls     Seat Belt Use: yes   Updated Prior Medication List: ADULT ASPIRIN LOW STRENGTH 81 MG  TBDP (ASPIRIN) Take 1 tablet by mouth once a day LOPRESSOR 50 MG TABS (METOPROLOL TARTRATE) take 1/2 tablet by mouth every morning and take 1/2 tablet by mouth every evening LIPITOR 20 MG TABS (ATORVASTATIN CALCIUM) take 1/2 tablet by mouth once daily VITAMIN D3 1000 UNIT  TABS (CHOLECALCIFEROL) take 2 tablets by mouth once daily AMBIEN 10 MG  TABS (ZOLPIDEM TARTRATE) 1 by mouth at bedtime as needed for sleep FISH OIL 1000 MG CAPS (OMEGA-3 FATTY ACIDS) 2 caps once daily * PRADAXA 150 MG TABS TAKE two times a day COLACE 100 MG CAPS (DOCUSATE SODIUM) Take 1 capsule by mouth three times a day x7days OXYCODONE-ACETAMINOPHEN 5-325 MG TABS (OXYCODONE-ACETAMINOPHEN) 1 every 6 hours as needed pain NAPROXEN 250 MG TABS (NAPROXEN) take 1-2 tabs by mouth two times a day as needed  for pain... FAMOTIDINE 20 MG TABS (FAMOTIDINE) Take 1 tablet by mouth two times a day x7days PROCTOSOL HC 2.5 % CREA (HYDROCORTISONE) apply to rectal area after each BM & at bedtime... * PRADAXA 150MG  Take as directed by DrKlein... DOXYCYCLINE HYCLATE 100 MG CAPS (DOXYCYCLINE HYCLATE) 1 by mouth two times a day for 10 days FUROSEMIDE 20 MG TABS (FUROSEMIDE) one by mouth once daily KEFLEX 500 MG CAP (CEPHALEXIN) Take 1 capsule by mouth four times a day X 14 days  Current Allergies (reviewed today): ! PENICILLIN ! CODEINE Past History:  Past Medical History: Last updated: 05/21/2009  HYPERTENSION, BORDERLINE (ICD-401.9) ATRIAL FIBRILLATION (ICD-427.31) Hx of TACHYARRHYTHMIA (ICD-785.0) Hx of DEEP VENOUS THROMBOPHLEBITIS (ICD-453.40) HYPERLIPIDEMIA (ICD-272.4) GERD (ICD-530.81) DIVERTICULOSIS OF COLON (ICD-562.10) COLONIC POLYPS (ICD-211.3) Hx of RENAL CALCULUS (ICD-592.0) DEGENERATIVE JOINT DISEASE (ICD-715.90) NECK PAIN (ICD-723.1) LOW BACK PAIN SYNDROME (ICD-724.2) HEADACHE (ICD-784.0) INTRACRANIAL ANEURYSM (ICD-437.3) Hx of CELLULITIS, ANKLE (ICD-682.6) ANXIETY (ICD-300.00)  Past Surgical History: Last updated: 05/21/2009  S/P T & A S/P nasal surgery in past S/P appendectomy  S/P hysterectomy S/P breast cyst removal S/P hemorroid surgery x 2 S/P ELap for SBO w/ lysis of adhesions 1984 S/P left knee surg in 1997 S/P right TKR in 2004 by Dr Thurston Hole  Family History: Last updated: Apr 02, 2009 Mother-deceased, MI age 36 Father-deceased, MI age 16 Brother deceased alcohol cirrhosis  Social History: Last updated: 03/03/2009 No alcohol, tobacco drugs Divorced 3 children, 4 grandchildren, 2 great grandchildren Worked in cigarette factory  Risk Factors: Alcohol Use: 0 (06/04/2009) Caffeine Use: 0 (06/04/2009) Exercise: no (06/04/2009)  Risk Factors: Smoking Status: never (06/04/2009)  Review of Systems       11 systems reviewed and negative except per HPI    Vital Signs:  Patient profile:   77 year old female Height:      66 inches (167.64 cm) Weight:      179.0 pounds (81.36 kg) BMI:     29.00 Temp:     96.9 degrees F (36.06 degrees C) oral Pulse rate:   68 / minute BP sitting:   150 / 73  (left arm)  Vitals Entered By: Baxter Hire) (June 04, 2009 10:56 AM) CC: 1 week follow up Is Patient Diabetic? No Pain Assessment Patient in pain? yes     Location: right lower leg Intensity: 7 Type: sharp Onset of pain  Constant Nutritional Status BMI of 25 - 29 = overweight Nutritional Status Detail appetite is good per patient  Does patient need assistance? Functional Status Self care Ambulation Normal   Physical Exam  General:  alert and well-developed.   Head:  normocephalic.   Mouth:  no gingival abnormalities.   Neck:  supple.   Lungs:  normal respiratory effort.   Heart:  normal rate and regular rhythm.   Abdomen:  soft, non-tender, and normal bowel sounds.   Msk:  normal ROM and no joint tenderness.   Extremities:  RLE with 2+ pitting edema to mid calf. Indurated. Area of skin esp medially has chronic venous stasis changes.  No open sores.    LLE 1+ edema around ankles.  Neurologic:  alert & oriented X3 and cranial nerves II-XII intact.   Skin:  RLE with chronic venous stasis changes   Impression & Recommendations:  Problem # 1:  Hx of CELLULITIS, ANKLE (ICD-682.6)  She likely has a mild cellulitis on top of venous stasis changes - no response to doxy.  WIll give keflex for 2 weeks and then she will call me to see if we should continue it, decrease it to twice a day or stop it. 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 Rec elevation, compression stockings.  Will contine to follow at lymphedema clinic for evaluation (At [facility])  Her updated medication list for this problem includes:    Adult Aspirin Low Strength 81 Mg  Tbdp (Aspirin) .Marland Kitchen... Take 1 tablet by mouth once a day    Oxycodone-acetaminophen 5-325 Mg Tabs (Oxycodone-acetaminophen) .Marland Kitchen... 1 every 6 hours as needed pain    Doxycycline Hyclate 100 Mg Caps (Doxycycline hyclate) .Marland Kitchen... 1 by mouth two times a day for 10 days    Keflex 500 Mg Cap (Cephalexin) .Marland Kitchen... Take 1 capsule by mouth four times a day x 14 days  Orders: T-CBC w/Diff ([address]-60454) T-Sed Rate (Automated) (09811-91478) Diagnostic X-Ray/Fluoroscopy (Diagnostic X-Ray/Flu) Consultation Level IV (29562)  Orders: Est. Patient Level IV (13086)  Problem # 2:  DEGENERATIVE JOINT DISEASE (ICD-715.90) follows with Dr Thurston Hole Her updated medication list for this problem includes:    Adult Aspirin Low Strength 81 Mg Tbdp (Aspirin) .Marland Kitchen... Take 1 tablet by mouth once a day    Oxycodone-acetaminophen 5-325  Mg Tabs (Oxycodone-acetaminophen) .Marland Kitchen... 1 every 6 hours as needed pain    Naproxen 250 Mg Tabs (Naproxen) .Marland Kitchen... Take 1-2 tabs by mouth two times a day as needed for pain...  Medications Added to Medication List This Visit: 1)  Keflex 500 Mg Cap (Cephalexin) .... Take 1 capsule by mouth four times a day x 14 days  Patient Instructions: 1)  Please schedule a follow-up appointment in 6 weeks. 2)  Call if new symptoms. 3)  Follow up with Ladora Daniel at the lymphedema clinic. Prescriptions: KEFLEX 500 MG CAP (CEPHALEXIN) Take 1 capsule by mouth four times a day X 14 days  #56 x 1   Entered and Authorized by:   Clydie Braun MD   Signed by:   Clydie Braun MD on 06/04/2009   Method used:   [facility]        [facility] [address] [phone]* (retail)       [address]       [place], [place]  [address]       Ph: [phone] or [phone]       Fax: [phone]   RxID:   [id number]

## 2010-06-11 NOTE — Assessment & Plan Note (Signed)
Summary: LIPID/LIVER   Visit Type:  Follow-up Primary Provider:  Alroy Dust, MD  CC:  dyslipidemia follow-up.  History of Present Illness:  Lipid Clinic Visit      The patient comes in today for dyslipidemia follow-up.  Her current dyslipidemia therapy includes Lipitor 10mg  daily and Fish Oil 2 capsules daily.  She has been tolerating her medications with no problems and is compliant.  She does not complain of chest pain, shortness of breath, muscle aches, or muscle cramps.  Dietary compliance review reveals pt has been trying to cut back on her sweets.  She has also been doing the [employer] and has lost about 20 lbs.  Review of exercise habits reveals that the patient is not exercising due to knee pain.     Lipid Management History:      Positive NCEP/ATP III risk factors include female age 59 years old or older and hypertension.  Negative NCEP/ATP III risk factors include non-diabetic, no family history for ischemic heart disease, and non-tobacco-user status.     Lipid Management Provider  Weston Brass, PharmD  Allergies: 1)  ! Penicillin 2)  ! Codeine  Past History:  Family History: Last updated: 2009/03/16 Mother-deceased, MI age 62 Father-deceased, MI age 2 Brother deceased alcohol cirrhosis   Vital Signs:  Patient profile:   77 year old female Weight:      175 pounds Pulse rate:   72 / minute BP sitting:   145 / 74  (left arm)  Impression & Recommendations:  Problem # 1:  HYPERLIPIDEMIA (ICD-272.4) Assessment Unchanged Pt's cholesterol remains at goal.  TC- 143 (goal <200), TG 72 (goal <150), HDL- 59 (goal >45), and LDL 70 (goal<130).  LFT are normal. She is tolerating her current regimen with no problems.  We will continue the same medications.  She is encouraged to continue with the Nutrasystem diet as she is having good success with weight loss while on this program.  This is important since she is unable to walk as much any more.  We will see her in clinic  again in 1 year.    Her updated medication list for this problem includes:    Lipitor 20 Mg Tabs (Atorvastatin calcium) .Marland Kitchen... Take 1/2 tablet by mouth once daily  Lipid Assessment/Plan:      Based on NCEP/ATP III, the patient's risk factor category is "2 or more risk factors and a calculated 10 year CAD risk of < 20%".  The patient's lipid goals are as follows: Total cholesterol goal is 200; LDL cholesterol goal is 130; HDL cholesterol goal is 40; Triglyceride goal is 150.

## 2010-06-11 NOTE — Consult Note (Signed)
Summary: [facility]  [facility]   Imported By: Florinda Marker 06/12/2009 15:03:35  _____________________________________________________________________  External Attachment:    Type:   Image     Comment:   External Document

## 2010-06-11 NOTE — Assessment & Plan Note (Signed)
Summary: 49month f/u/vs   Primary Provider:  Alroy Dust, MD  CC:  1 month follow up.  History of Present Illness: 77 yo wf with mmp whom I follow in  referral from Dr Thurston Hole for RLE cellulitis.  This began in Apr 23, 2010  when R foot became tender and swollen.  Went to ED and got abx. Given abx again by Dr Enis Slipper in Nov but no benefit.   Went to see Dr Thurston Hole in Dec and admitted for 3 days and got IV abx and then d/ced on bactrim for 10 days which helped.  Had dx of superficial thrombophlebitis at that time.  she saw Dr Kriste Basque after and told her leg was ok.  Dr Thurston Hole saw pt and rec pt see ID for eval.  Currenlty on keflex once daily.  No worsening of leg.  Has an apptment to see vascular vein specialist next month. Has a R TKR 7 yrs ago.  Has a h/o blood clot in LLE after knee arthroscopy 7 yrs ago.  Also had leg fx on R 2003.  No history of open wounds or ulcers on legs.    Preventive Screening-Counseling & Management  Alcohol-Tobacco     Alcohol drinks/day: 0     Smoking Status: never  Caffeine-Diet-Exercise     Caffeine use/day: 0     Does Patient Exercise: no  Safety-Violence-Falls     Seat Belt Use: yes   Updated Prior Medication List: * PRADAXA 150 MG TABS TAKE two times a day LOPRESSOR 50 MG TABS (METOPROLOL TARTRATE) take 1/2 tablet by mouth every morning and take 1/2 tablet by mouth every evening SIMVASTATIN 20 MG TABS (SIMVASTATIN) Take one tablet by mouth daily at bedtime FISH OIL 1000 MG CAPS (OMEGA-3 FATTY ACIDS) 2 caps once daily COLACE 100 MG CAPS (DOCUSATE SODIUM) Take 1 capsule by mouth three times a day x7days PROCTOSOL HC 2.5 % CREA (HYDROCORTISONE) apply to rectal area after each BM & at bedtime... VIVELLE-DOT 0.05 MG/24HR PTTW (ESTRADIOL) use 2 times weekly NAPROXEN 250 MG TABS (NAPROXEN) take 1-2 tabs by mouth two times a day as needed for pain... VITAMIN D3 1000 UNIT  TABS (CHOLECALCIFEROL) take 2 tablets by mouth once daily TEMAZEPAM 30 MG CAPS  (TEMAZEPAM) take 1 cap by mouth at bedtime as needed for sleep... KEFLEX 500 MG CAPS (CEPHALEXIN) one by mouth once daily DRYSOL 20 % SOLN (ALUMINUM CHLORIDE) APPLY AT BEDTIME  Current Allergies (reviewed today): ! PENICILLIN ! CODEINE Past History:  Past Medical History: Last updated: 08/19/2009  HYPERTENSION, BORDERLINE (ICD-401.9) ATRIAL FIBRILLATION (ICD-427.31) Hx of TACHYARRHYTHMIA (ICD-785.0) Hx of DEEP VENOUS THROMBOPHLEBITIS (ICD-453.40) HYPERLIPIDEMIA (ICD-272.4) GERD (ICD-530.81) DIVERTICULOSIS OF COLON (ICD-562.10) COLONIC POLYPS (ICD-211.3) HEMORRHOIDS (ICD-455.6) Hx of RENAL CALCULUS (ICD-592.0) DEGENERATIVE JOINT DISEASE (ICD-715.90) NECK PAIN (ICD-723.1) LOW BACK PAIN SYNDROME (ICD-724.2) Hx of CELLULITIS, ANKLE (ICD-682.6) HEADACHE (ICD-784.0) INTRACRANIAL ANEURYSM (ICD-437.3) ANXIETY (ICD-300.00) INSOMNIA, CHRONIC (ICD-307.42)  Past Surgical History: Last updated: 08/19/2009  S/P T & A S/P nasal surgery in past S/P appendectomy  S/P hysterectomy S/P breast cyst removal S/P hemorroid surgery x 2 S/P ELap for SBO w/ lysis of adhesions 1984 S/P left knee surg in 1997 S/P right TKR in 2004 by Dr Thurston Hole  Family History: Last updated: 04/23/2009 Mother-deceased, MI age 58 Father-deceased, MI age 20 Brother deceased alcohol cirrhosis  Social History: Last updated: 03/03/2009 No alcohol, tobacco drugs Divorced 3 children, 4 grandchildren, 2 great grandchildren Worked in cigarette factory  Risk Factors: Alcohol Use: 0 (09/29/2009) Caffeine Use:  0 (09/29/2009) Exercise: no (09/29/2009)  Risk Factors: Smoking Status: never (09/29/2009)  Review of Systems       11 systems reviewed and negative except per HPI   Vital Signs:  Patient profile:   77 year old female Height:      68 inches (172.72 cm) Weight:      167.8 pounds (76.27 kg) BMI:     25.61 Temp:     97.4 degrees F (36.33 degrees C) oral Pulse rate:   59 / minute BP sitting:    115 / 70  (left arm)  Vitals Entered By: Baxter Hire) (Sep 29, 2009 2:34 PM) CC: 1 month follow up Pain Assessment Patient in pain? no      Nutritional Status BMI of 25 - 29 = overweight Nutritional Status Detail appetite is good per patient  Does patient need assistance? Functional Status Self care Ambulation Normal   Physical Exam  General:  alert and well-developed.   Head:  normocephalic and atraumatic.   Eyes:  vision grossly intact and pupils equal.   Mouth:  fair dentition.   Neck:  supple.   Lungs:  normal respiratory effort and normal breath sounds.   Heart:  normal rate and regular rhythm.   Abdomen:  soft.   Msk:  normal ROM.   Extremities:  RLE with trace  pitting edema to mid calf.There is a knotty induration that is somewhat tender to palp medially just above the ankle The chronic venous stasis changes are much improved.  No open sores.    LLE 1+ edema around ankles.  Skin:  RLE with chronic venous stasis changes Psych:  Oriented X3.     Impression & Recommendations:  Problem # 1:  Hx of CELLULITIS, ANKLE (ICD-682.6)  This is much improved and the cellulitis has not recurred on suppressive keflex.  Will continue keflex once daily and compressive stockings.  SHe will see Vascular surgery Her updated medication list for this problem includes:    Keflex 500 Mg Caps (Cephalexin) ..... One by mouth once daily  Orders: Est. Patient Level III (62130)  Problem # 2:  CHRONIC VENOUS HYPERTENSION WITHOUT COMPS (ICD-459.30)  ISHe has apptment with vascular specialitst to see if can improve venous outflow to prevent the chronic.  Orders: Est. Patient Level III (86578)  Patient Instructions: 1)  Follow up with [place] if you get another episode of infection in your leg. 2)  Continue to take the keflex for at least 6 months, then stop it and monitor for recurrence.

## 2010-06-11 NOTE — Letter (Signed)
Summary: Gretta Cool MD  Gretta Cool MD   Imported By: Lester [place] 10/07/2009 10:40:13  _____________________________________________________________________  External Attachment:    Type:   Image     Comment:   External Document

## 2010-06-11 NOTE — Progress Notes (Signed)
Summary: questions re monitor/cell phone   Phone Note Call from Patient Call back at Home Phone [phone]   Caller: Patient Summary of Call: Patient has concerns of using the cell phone with monitor.  Please call to discuss need and how to use.  Initial call taken by: Burnard Leigh,  March 07, 2009 1:38 PM  Additional Follow-up for Phone Call Additional follow up Details #1::        talk to patinet about cell phone for the monitor

## 2010-06-11 NOTE — Progress Notes (Signed)
Summary: leg not healing-TY  Phone Note Call from Patient   Caller: Patient Call For: Samantha Braun MD Summary of Call: Patient called stating that she was told to call Dr. Sampson Goon and let him know how she was doing.  She c/o of still having leg pain and redness, but no drainage. She has two days left of meds. Wants to know if she needs to continue or see you. Initial call taken by: Starleen Arms CMA,  June 19, 2009 12:32 PM  Follow-up for Phone Call        ask her to continue to keflex until follow up Follow-up by: Samantha Braun MD,  June 19, 2009 12:36 PM    New/Updated Medications: KEFLEX 500 MG CAPS (CEPHALEXIN) 1 tablet 4 times daily Prescriptions: KEFLEX 500 MG CAPS (CEPHALEXIN) 1 tablet 4 times daily  #120 x 1   Entered by:   Starleen Arms CMA   Authorized by:   Samantha Braun MD   Signed by:   Starleen Arms CMA on 06/19/2009   Method used:   [facility]        [facility] [address] [phone]* (retail)       [address]       [place], [place]  [address]       Ph: [phone] or [phone]       Fax: [phone]   RxID:   [id number]

## 2010-06-11 NOTE — Consult Note (Signed)
Summary: [facility]  [facility]   Imported By: Esmeralda Links D'jimraou 10/24/2007 15:00:39  _____________________________________________________________________  External Attachment:    Type:   Image     Comment:   External Document

## 2010-06-11 NOTE — Letter (Signed)
Summary: [facility]  [facility]   Imported By: Lester [place] 08/26/2009 08:50:14  _____________________________________________________________________  External Attachment:    Type:   Image     Comment:   External Document

## 2010-06-11 NOTE — Progress Notes (Signed)
Summary: cold/ sinus/ throat  Phone Note Call from Patient Call back at Home Phone [phone]   Caller: Patient Call For: nadel Summary of Call: pt c/o sinus congestion/ sore throat x 2 days. no fever. no aches or chills. "slight cough". hasn't taken any OTC meds. [facility]. says levaquin and prednisone helped "last time".  Initial call taken by: Tivis Ringer, CNA,  March 16, 2010 8:45 AM  Follow-up for Phone Call        The patient states she has had sinus congestion, sore throat and cough with clear mucus x2 days. She denies any fever, body aches or chills. Would like RX called to pharmacy. Please advise.Michel Bickers CMA  March 16, 2010 9:00 AM  Additional Follow-up for Phone Call Additional follow up Details #1::        per SN----ok for pt to have zpak #1  as directed and use mucinex dm 2 by mouth two times a day with plenty of fluids.  thanks Randell Loop CMA  March 16, 2010 11:47 AM   Patient is aware of recs from [facility] [address] Additional Follow-up by: Michel Bickers CMA,  March 16, 2010 11:54 AM    New/Updated Medications: ZITHROMAX Z-PAK 250 MG TABS (AZITHROMYCIN) use as directed Prescriptions: ZITHROMAX Z-PAK 250 MG TABS (AZITHROMYCIN) use as directed  #1 x 0   Entered by:   Michel Bickers CMA   Authorized by:   Michele Mcalpine MD   Signed by:   Michel Bickers CMA on 03/16/2010   Method used:   Electronically to        [facility] [address] [phone]* (retail)       [address]       [place], [place]  [address]       Ph: [phone] or [phone]       Fax: [phone]   RxID:   [id number]

## 2010-06-11 NOTE — Progress Notes (Signed)
Summary: [facility] PROCEDURE/STOP MED  Phone Note Call from Patient Call back at Home Phone [phone]   Caller: Patient Reason for Call: Talk to Nurse Summary of Call: PT NEEDS TO STOP PRADAXA FOR 5 DAY PRIOR TO PROCEDURE ON HER NEXT.... IS THIS OK? Initial call taken by: Migdalia Dk,  April 18, 2009 8:35 AM  Follow-up for Phone Call        Called pt and she is aware that it is ok for her to stop her Pradaxa for 5 days. I called [name] to get a fax # but there was nothing but answering machines. I left a message. The clearance letter is ready to be faxed when we get a fax #.  Follow-up by: Duncan Dull, RN, BSN,  April 21, 2009 3:42 PM

## 2010-06-11 NOTE — Assessment & Plan Note (Signed)
Summary: 3-4 months/apc   Primary Care Provider:  Alroy Dust, MD  CC:  3 month ROV & review of mult medical problems....  History of Present Illness: 77 y/o WF here for a follow up visit... please see my prev EMR notes for details- reviewed:  Presented 3/09 w/ headache that sounded like cervicogenic HA, and CSpine films showed degen spondylosis & facet hypertrophy- Rx'd w/ MOBIC 7.5mg /d & SKELAXIN 800mg Tid, heat, etc...  she also c/o "sinus" symptoms Rx'd w/ Avelox, Nasonex, saline etc (symptoms improved)... 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  She was referred to [facility] for Neurology who concurred, and rec physical therapy for her neck and ZANAFLEX... this helped some and her pain is improved... she notes that she cannot tolerate the Percocet (nausea) and that DCN does not work for her... she is asking about Oxycontin which she apparently had yrs ago after knee surgery, but I have denied this... we will call in some MIDRIN for her to try for the headaches as needed...   In the meanwhile we contacted her relatives at [facility] and I sent her scan and notes to Dr. Annamary Rummage at the Dept of Radiology [phone] [id number] [place] [address]... their review indicated that they felt her aneurysm was coming off the supraclinoid segment of the left ICA and they requested that we perform a CT Angio for further images... this was done 09/08/07 and showed a 5mm aneurysm projecting posteriorly from the junction of the cavernous and supraclinoid segments of the left ICA... we sent a disc of the study to DrStone as he wanted to measure the size of the aneurysms mouth (helpful in determining whether to plan a coil or stent later)... he asked [place] to repeat the CT Angio is 6 months...   ~  April 11, 2008:  MrsWilliams informs me that her granddaughter has broken off her  engagement to DrStone at [place] and she will need a local Interventional Radiologist to follow this aneurysm- I have recommended Dr. Kerby Nora & we will set up an appt for her-  her f/u CTAngio 04/08/08 showed no significant change and this is reassuring... she tells me that if she needs surg she wants to go to [facility] to see [facility] doctor... she no longer has any dizziness and she states "my head is better"...    **she saw DrDeveshwar 12/09 & he rec cerebral angiogram & consideration of endovasc coiling- she refused further eval feeling that he just wanted to "operate right away".   ~  May 21, 2009:  she developed a cellulitis in right lower extremity- treated as outpt w/ Doxy but no better- went to see her Ortho, DrWainer who sent her to hosp for IV antibiotics & adm by [facility] (she has hx DVT/ PE after knee surg 1997) ... VenDopplers were neg- but tender on palp & ?superfic thrombophlebitis entertained- Rx's w/ BacterimDS, along w/ Naprosyn & Percocet for pain...  Post hosp c/o alteration in bowels w/ some blood seen (hx 2 prev Hem surg)- rec> Align + Activia...   Hx cardiac arrhythmia in past- AVnodal reentrant tachycard s/p ablation 2003- w/ palpit eval 10/10 DrMcAlhaney>> EKG= NSR rate 66/min, WNL;  2DEcho showed norm LV w/ EF=55-65%, mild AoV thickening, triv MR, LA=33mm;  Holter Monitor showed PAF;  & CDopplers showed calcif plaque in ICA's 0-39% stenoses... DrKlein started [facility] for  CHADS score of 4 & she takes an extra 1/2 Lopressor Prn symptoms...   ~  August 19, 2009:  DrWainer sent her to ID- DrFitzgerald 1/11 for further treatment of her recurrent RLE cellulitis & superfic thrombophleb> he placed her on Keflex & she remains on this med Tid at present (prolonged course over several months)... she was getting dressing changes Qod at DrWainers "til medicare wouldn't pay" & now doing it at home... last note from DrFitzgerald 3/11 reviewed- he indicated that he was weaning the Keflex down from  Tid to Bid to Qd but I don't think she got it (tells me she's still taking it Tid)... she has f/u w/ him next week. She saw DrKlein 2/11 for f/u AFib on Pradaxa, & he stopped her ASA, also changed Lip20 to Simva20 for $$ reasons- due for f/u FLP...  She is also due for f/u CT Angio Brain to re-image her aneurysm (last 11/109)... nedded fasting labs first. Carlena Hurl she is inquiring about a substitute for "mitchum" aluminum hydroxide cream for excess persperation- try Drysol per Pharm.    Current Problem List:  SINUSITIS, ACUTE (ICD-461.9) - she uses Nasal Saline Mist & NASONEX as needed... she saw DrRosen for ENT w/ deviated septum found, otherw neg, but it's hard for her to breathe thru her nose...  HYPERTENSION (ICD-401.9) - controlled on LOPRESSOR 50mg - 1/2 tab Bid... BP today is 128/72...  ~  2DEcho 1996 showed mild LVH, norm valves, EF=60%...  ~  NuclearStressTest 4/04 was neg- no infarct or ischemia, EF=69%... similiar to 1999 study.  ~  2DEcho 10/10 showed norm LV w/ EF=55-65%, mild AoV thickening, triv MR, LA=19mm...  Hx of TACHYARRHYTHMIA (ICD-785.0) - hx atrial tachycardia and AV nodal reentry in 2003... s/p ablation, no recurrent symptoms... last saw [facility] 4/08... referred to [facility] 10/10 w/ incr palpit & cards eval including>> EKG= NSR rate 66/min, WNL;  2DEcho showed norm LV w/ EF=55-65%, mild AoV thickening, triv MR, LA=15mm;  Holter Monitor showed PAF;  & CDopplers showed calcif plaque in ICA's 0-39% stenoses... DrKlein started [employer] 150mg Bid & she takes an extra 1/2 Lopressor Prn symptoms...  Hx of DEEP VENOUS THROMBOPHLEBITIS (ICD-453.40) & Hx of CELLULITIS, ANKLE (ICD-682.6) - she had a DVT and a small PE after knee surgery in 1997... subseq eval of her veins by the vasc surgeons showed venous stasis disease...  developed cellulitis & ?of superficial thrombophlebitis 12/10- hosp transiently & VenDopplers neg for DVT, treated w/ BacterimDS, Naprosyn, Percocet & initially  improved... DrWainer sent her to DrFitzgerald 1/11 for recurrent cellulitis RLE & he started prolonged course of Keflex & local wraps...  HYPERLIPIDEMIA (ICD-272.4) - prev on Lipitor 20mg - 1/2 tab daily + FishOil 2/d... tolerates Rx well but too$$ & DrKlein 2/11 changed to SIMVASTATIN 20mg /d... due for f/u FLP.  ~  FLP 11/08 in the Lipid Clinic showing TChol 148, TG 84, HDL 53, LDL 78... LFT's were WNL.Marland Kitchen.  ~  FLP 10/09 showed TChol 162, TG 85, HDL 63, LDL 83  ~  Lipid Clinic f/u 11/10 on Lip10 + FishOil 2/d>> TChol 143, TG 72, HDL 59, LDL 70  ~  FLP 4/11 on Simva20 showed =   GERD (ICD-530.81) - last EGD was 3/06 showing chr gastritis & duodenitis... prev on Aciphex20, now Prn Pepcid vs Prilosec...  DIVERTICULOSIS OF COLON (ICD-562.10) & COLONIC POLYPS (ICD-211.3) - last colonoscopy was 1/07 by DrSam showing divertics only... f/u planned 69yrs... (last polyp was 1999 tiny & cauterized).  ~  1/11:  c/o bowel  irregularity & some blood seen after hosp for cellulitis- Rx w/ Align/ Activia/ Proctocort cream.  Hx of RENAL CALCULUS (ICD-592.0) - stones seen in kidney on CTAbd 2007, non-obstructing & no hx of symptomatic nephrolithiasis...  DEGENERATIVE JOINT DISEASE (ICD-715.90) - prev hx of arthroscopy w/ DVT and PTE in 1995... she had a right TKR by DrWainer 4/04...  **apparently she had arrhythmia post op "heart stopped for 18sec & DrWainer said I can't be put to sleep no more"  LOW BACK PAIN SYNDROME (ICD-724.2) - she has had LBP evauated by Ortho- DrWainer, DrGioffre; and by Neurosurg- DrJenkins in 2003... MRI showed a disc at L3-4 w/ mild spinal stenosis, but he did not rec surg...   NECK PAIN & HEADACHE - Dx w/ occip neuralgia & cervical degen disc dis w/ prev eval here in 2009 & another eval at [facility] 7/10- treated w/ bilat occip nerve blocks 9/10 & improved... refered to [facility] to continue this therapy closer to home...   INTRACRANIAL ANEURYSM (ICD-437.3) *** SEE ABOVE ***  ANXIETY  (ICD-300.00) & INSOMNIA, CHRONIC (ICD-307.42) - she notes that Ambien 10mg  w/o help and wants something stronger- try RESTORIL 30mg  Qhs Prn...  GYN = DrLomax & last seen 12/08... on VIVELLE & Ca++/ VitD... he did BMD in 10/07 w/ osteopenia...   Allergies: 1)  ! Penicillin 2)  ! Codeine  Comments:  Nurse/Medical Assistant: The patient's medications and allergies were reviewed with the patient and were updated in the Medication and Allergy Lists.  Past History:  Past Medical History:  HYPERTENSION, BORDERLINE (ICD-401.9) ATRIAL FIBRILLATION (ICD-427.31) Hx of TACHYARRHYTHMIA (ICD-785.0) Hx of DEEP VENOUS THROMBOPHLEBITIS (ICD-453.40) HYPERLIPIDEMIA (ICD-272.4) GERD (ICD-530.81) DIVERTICULOSIS OF COLON (ICD-562.10) COLONIC POLYPS (ICD-211.3) HEMORRHOIDS (ICD-455.6) Hx of RENAL CALCULUS (ICD-592.0) DEGENERATIVE JOINT DISEASE (ICD-715.90) NECK PAIN (ICD-723.1) LOW BACK PAIN SYNDROME (ICD-724.2) Hx of CELLULITIS, ANKLE (ICD-682.6) HEADACHE (ICD-784.0) INTRACRANIAL ANEURYSM (ICD-437.3) ANXIETY (ICD-300.00) INSOMNIA, CHRONIC (ICD-307.42)  Past Surgical History:  S/P T & A S/P nasal surgery in past S/P appendectomy  S/P hysterectomy S/P breast cyst removal S/P hemorroid surgery x 2 S/P ELap for SBO w/ lysis of adhesions 1984 S/P left knee surg in 1997 S/P right TKR in 2004 by Dr Thurston Hole  Family History: Reviewed history from 03/28/2009 and no changes required. Mother-deceased, MI age 57 Father-deceased, MI age 37 Brother deceased alcohol cirrhosis  Social History: Reviewed history from 03/03/2009 and no changes required. No alcohol, tobacco drugs Divorced 3 children, 4 grandchildren, 2 great grandchildren Worked in cigarette factory  Review of Systems      See HPI       The patient complains of dyspnea on exertion, peripheral edema, headaches, and difficulty walking.  The patient denies anorexia, fever, weight loss, weight gain, vision loss, decreased hearing,  hoarseness, chest pain, syncope, prolonged cough, hemoptysis, abdominal pain, melena, hematochezia, severe indigestion/heartburn, hematuria, incontinence, muscle weakness, suspicious skin lesions, transient blindness, depression, unusual weight change, abnormal bleeding, enlarged lymph nodes, and angioedema.    Vital Signs:  Patient profile:   77 year old female Height:      68 inches Weight:      171 pounds O2 Sat:      96 % on Room air Temp:     97.5 degrees F oral Pulse rate:   60 / minute BP sitting:   128 / 72  (right arm) Cuff size:   regular  Vitals Entered By: Randell Loop CMA (August 19, 2009 10:54 AM)  O2 Sat at Rest %:  96 O2 Flow:  Room air CC: 3 month ROV & review of mult medical problems... Is Patient Diabetic? No Pain Assessment Patient in pain? no      Comments meds updated today   Physical Exam  Additional Exam:  WD, WN, 77 y/o WF in NAD... GENERAL:  Alert & oriented; pleasant & cooperative... HEENT:  [place]/AT, EOM-wnl, PERRLA, EACs-clear, TMs-wnl, NOSE- sl red, THROAT- no exud seen... NECK:  Supple w/ decr ROM; no JVD; normal carotid impulses w/o bruits; no thyromegaly or nodules palpated; no lymphadenopathy. CHEST:  Clear to P & A; without wheezes/ rales/ or rhonchi. HEART:  Regular Rhythm; msc gr 1/6 SEM without rubs or gallops heard... ABDOMEN:  Soft & nontender; normal bowel sounds; no organomegaly or masses detected. EXT: without deformities, mod arthritic changes; no varicose veins/ +venous insuffic/ tr edema. Right lower leg wrapped, support hose, less discomfort... NEURO:  CN's intact;  no focal neuro deficit... DERM:  No lesions noted; no rash etc... just chr ven insuffic changes in LE's...    MISC. Report  Procedure date:  08/19/2009  Findings:      She will ret for FASTING blood work, and we will sched f/u CT Angio Brain to compare to 11/10 study...  SN   Impression & Recommendations:  Problem # 1:  HYPERTENSION, BORDERLINE (ICD-401.9) BP  controlled-  continue med. Her updated medication list for this problem includes:    Lopressor 50 Mg Tabs (Metoprolol tartrate) .Marland Kitchen... Take 1/2 tablet by mouth every morning and take 1/2 tablet by mouth every evening  Problem # 2:  ATRIAL FIBRILLATION (ICD-427.31) Assessment: Improved Hx AFib & tachyarrhythmia followed by DrKlein & McAlhaney... Her updated medication list for this problem includes:    Lopressor 50 Mg Tabs (Metoprolol tartrate) .Marland Kitchen... Take 1/2 tablet by mouth every morning and take 1/2 tablet by mouth every evening  Problem # 3:  Hx of CELLULITIS, ANKLE (ICD-682.6) RLE cellulitis eval & Rx by drWainer & DrFitzgerald... she remains on Keflex from ID, and leg dressings from Ortho... Her updated medication list for this problem includes:    Keflex 500 Mg Caps (Cephalexin) .Marland Kitchen... 1 tablet three times a day  Problem # 4:  HYPERLIPIDEMIA (ICD-272.4) DrKlein changed her from Lip to Simva20... f/u FLP on this is pending... Her updated medication list for this problem includes:    Simvastatin 20 Mg Tabs (Simvastatin) .Marland Kitchen... Take one tablet by mouth daily at bedtime  Problem # 5:  GERD (ICD-530.81) She has hx GERD, divertics, polyps, hems>> prev followed for GI by DrSamLeBauer... she is stable on OTC H2blocker vs PPI Prn... she denies prob from the prolonged antibiotic Rx...  Problem # 6:  INTRACRANIAL ANEURYSM (ICD-437.3) She is due for f/u CT Angio to check her 5mm intracranial aneurysm... we will set this up for her. Orders: Radiology Referral (Radiology)  Problem # 7:  MULT OTHER MEDICICAL PROBLEMS>>> She has mult somatic complaints- including excess underarm sweating & she wants a substit for "mitchum" > I checked w/ Pharm- try Drysol.  Problem # 8:  INSOMNIA, CHRONIC (ICD-307.42) She wants stronger alternative to Ambien 10mg ... try restoril 30mg  Prn.  Complete Medication List: 1)  Pradaxa 150 Mg Tabs  .... Take two times a day 2)  Lopressor 50 Mg Tabs (Metoprolol tartrate)  .... Take 1/2 tablet by mouth every morning and take 1/2 tablet by mouth every evening 3)  Simvastatin 20 Mg Tabs (Simvastatin) .... Take one tablet by mouth daily at bedtime 4)  Fish Oil 1000 Mg Caps (Omega-3 fatty acids) .Marland KitchenMarland KitchenMarland Kitchen  2 caps once daily 5)  Colace 100 Mg Caps (Docusate sodium) .... Take 1 capsule by mouth three times a day x7days 6)  Proctosol Hc 2.5 % Crea (Hydrocortisone) .... Apply to rectal area after each bm & at bedtime.Marland KitchenMarland Kitchen 7)  Vivelle-dot 0.05 Mg/24hr Pttw (Estradiol) .... Use 2 times weekly 8)  Naproxen 250 Mg Tabs (Naproxen) .... Take 1-2 tabs by mouth two times a day as needed for pain.Marland KitchenMarland Kitchen 9)  Vitamin D3 1000 Unit Tabs (Cholecalciferol) .... Take 2 tablets by mouth once daily 10)  Temazepam 30 Mg Caps (Temazepam) .... Take 1 cap by mouth at bedtime as needed for sleep... 11)  Keflex 500 Mg Caps (Cephalexin) .Marland Kitchen.. 1 tablet three times a day 12)  Drysol 20 % Soln (Aluminum chloride) .... Apply at bedtime  Other Orders: Prescription Created Electronically [phone])  Patient Instructions: 1)  Today we updated your med list- see below.... 2)  We changed the AMBIEN to generic RESTORIL (Temazepam) a stronger sleeping pill to try for your severe insomnia.Marland KitchenMarland Kitchen 3)  We will arrange for a follow up CT Angio to recheck the aneurysm.Marland KitchenMarland Kitchen  4)  We need to check FASTING blood work here first- please ret to our lab in the AM for your FASTING blood work... 5)  We will call you w/ these results when avail.Marland KitchenMarland Kitchen 6)  Call for any questions.Marland KitchenMarland Kitchen 7)  Please schedule a follow-up appointment in 6 months. Prescriptions: TEMAZEPAM 30 MG CAPS (TEMAZEPAM) take 1 cap by mouth at bedtime as needed for sleep...  #30 x 5   Entered and Authorized by:   Michele Mcalpine MD   Signed by:   Michele Mcalpine MD on 08/19/2009   Method used:   Print then Give to Patient   RxID:   [id number]

## 2010-06-11 NOTE — Letter (Signed)
Summary: **LMOM-Need fax #** Clearance Letter  [facility], Main Office  [address]   [place], [place] [address]   Phone: [phone]  Fax: [phone]    April 21, 2009  Re:     [facility] Address:   [address]     [place], [place]  [address] DOB:     1925/02/05 MRN:     [id number]   Dr. Haskel Khan,    The above patient is under our care for cardiac related issues. She is stable from a cardiac stand point to under go a nerve block as scheduled. She may stop her Prodaxa for 5 days prior to her procedure. We ask that she starts back on the Pradaxa as soon as the procedure is completed. Feel free to contact our office at [phone] with any questions you may have.             Sincerely,  Duncan Dull, RN, BSN Sherryl Manges, MD, [facility]  Appended Document: Clearance Letter Need fax # in order to fax this letter to [facility]. Called and left message on machine

## 2010-06-11 NOTE — Progress Notes (Signed)
Summary: On Call- Painful ankle  Phone Note Call from Patient Call back at Home Phone [phone]   Caller: Patient Call For: nadel Summary of Call: On Call- Patient called 1230 PM reporting ankle painful, swollen, getting worse x 2 days, can't walk. "Like when I had a blood clot". Plan: Sent to ER. Initial call taken by: Waymon Budge MD,  March 30, 2009 6:42 PM  Follow-up for Phone Call        pt went to ed, no blood clot, but pt's ankle still swollen and painful.  Need to see SN.  Looks like a goose egg on her ankle. Follow-up by: Eugene Gavia,  March 31, 2009 9:44 AM  Additional Follow-up for Phone Call Additional follow up Details #1::        Appt with Tammy Parrett 11/23 at 11:00.  Okay per Deatra Robinson RN  March 31, 2009 12:42 PM

## 2010-06-11 NOTE — Assessment & Plan Note (Signed)
Summary: f/u rt leg cellulitis/TY   Primary Provider:  Alroy Dust, MD  CC:  follow-up visit and right leg cellulitis.  History of Present Illness: 77 yo wf with mmp whom I follow in  referral from Dr Thurston Hole for RLE cellulitis.  This began in 11/23/24when R foot became tender and swollen.  Went to ED and got abx. Given abx again by Dr Enis Slipper in Nov but no benefit.   Went to see Dr Thurston Hole in Dec and admitted for 3 days and got IV abx and then d/ced on bactrim for 10 days which helped.  Had dx of superficial thrombophlebitis at that time.  she saw Dr Kriste Basque after ago and told her leg was ok.  Dr Thurston Hole saw pt and rec pt see ID for eval.  Has a R TKR 7 yrs ago.  Has a h/o blood clot in LLE after knee arthroscopy 7 yrs ago.  Also had leg fx on R 2003.  No history of open wounds or ulcers on legs.  I last saw 1/26 and kept her on keflex for another 2 eeks.  She then called to say her leg was not much  better so she continued keflex again qid.    Now she is doing quite well.  Getting her leg wrapped every other day.  ALso using a support hose.      Prior Medication List:  LOPRESSOR 50 MG TABS (METOPROLOL TARTRATE) take 1/2 tablet by mouth every morning and take 1/2 tablet by mouth every evening SIMVASTATIN 20 MG TABS (SIMVASTATIN) Take one tablet by mouth daily at bedtime VITAMIN D3 1000 UNIT  TABS (CHOLECALCIFEROL) take 2 tablets by mouth once daily AMBIEN 10 MG  TABS (ZOLPIDEM TARTRATE) 1 by mouth at bedtime as needed for sleep FISH OIL 1000 MG CAPS (OMEGA-3 FATTY ACIDS) 2 caps once daily * PRADAXA 150 MG TABS TAKE two times a day COLACE 100 MG CAPS (DOCUSATE SODIUM) Take 1 capsule by mouth three times a day x7days OXYCODONE-ACETAMINOPHEN 5-325 MG TABS (OXYCODONE-ACETAMINOPHEN) 1 every 6 hours as needed pain NAPROXEN 250 MG TABS (NAPROXEN) take 1-2 tabs by mouth two times a day as needed for pain... FAMOTIDINE 20 MG TABS (FAMOTIDINE) Take 1 tablet by mouth two times a day  x7days PROCTOSOL HC 2.5 % CREA (HYDROCORTISONE) apply to rectal area after each BM & at bedtime... DOXYCYCLINE HYCLATE 100 MG CAPS (DOXYCYCLINE HYCLATE) 1 by mouth two times a day for 10 days KEFLEX 500 MG CAPS (CEPHALEXIN) 1 tablet 4 times daily   Current Allergies: ! PENICILLIN ! CODEINE Past History:  Past Medical History: Last updated: 06/24/2009 s/p avnrt ablation and cristal tachycardia atrial fibrillation Hypertension  Past Surgical History: Last updated: 05/21/2009  S/P T & A S/P nasal surgery in past S/P appendectomy  S/P hysterectomy S/P breast cyst removal S/P hemorroid surgery x 2 S/P ELap for SBO w/ lysis of adhesions 1984 S/P left knee surg in 1997 S/P right TKR in 2004 by Dr Thurston Hole  Family History: Last updated: 04-01-09 Mother-deceased, MI age 42 Father-deceased, MI age 63 Brother deceased alcohol cirrhosis  Social History: Last updated: 03/03/2009 No alcohol, tobacco drugs Divorced 3 children, 4 grandchildren, 2 great grandchildren Worked in cigarette factory  Risk Factors: Alcohol Use: 0 (06/04/2009) Caffeine Use: 0 (06/04/2009) Exercise: no (06/04/2009)  Risk Factors: Smoking Status: never (06/04/2009)  Review of Systems       11 systems reviewed and negative except per HPI   Vital Signs:  Patient profile:  77 year old female Height:      66 inches (167.64 cm) Weight:      175.9 pounds (79.95 kg) Temp:     97.3 degrees F (36.28 degrees C) oral Pulse rate:   63 / minute BP sitting:   122 / 71  (left arm)  Vitals Entered By: Wendall Mola CMA Duncan Dull) (July 10, 2009 2:12 PM) CC: follow-up visit, right leg cellulitis Is Patient Diabetic? No Pain Assessment Patient in pain? yes     Location: rt leg Intensity: 2 Type: sharp Nutritional Status BMI of 25 - 29 = overweight  Does patient need assistance? Functional Status Self care Ambulation Normal   Physical Exam  General:  alert and well-developed.   Head:   normocephalic and atraumatic.   Mouth:  fair dentition.   Neck:  supple.   Lungs:  normal respiratory effort and no accessory muscle use.   Heart:  normal rate and regular rhythm.   Abdomen:  soft and non-tender.   Msk:  normal ROM and no joint tenderness.   Extremities:  RLE withtrace  pitting edema to mid calf. No nduration. The chronci venous stasis changes are much improved.  No open sores.    LLE 1+ edema around ankles.  Skin:  RLE with chronic venous stasis changes   Impression & Recommendations:  Problem # 1:  Hx of CELLULITIS, ANKLE (ICD-682.6)  She likely had a cellulitis on top of venous stasis changes -She responded to kelfex treatment and leg wraps and it is much better. 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 At this point I will continue her on keflex bid for one week, then once a day. Rec continued elevation and compression stockings and wraps. Had followed up at [facility] lymphedema clinic and has been discharged from their care. I will see in 6 weeks time  Her updated medication list for this problem includes:    Adult Aspirin Low Strength 81 Mg Tbdp (Aspirin) .Marland Kitchen... Take 1 tablet by mouth once a day    Oxycodone-acetaminophen 5-325 Mg Tabs (Oxycodone-acetaminophen) .Marland Kitchen... 1 every 6 hours as needed pain    Doxycycline Hyclate 100 Mg Caps (Doxycycline hyclate) .Marland Kitchen... 1 by mouth two times a day for 10 days    Keflex 500 Mg Cap (Cephalexin) .Marland Kitchen... Take 1 capsule by mouth four times a day x 14 days  Orders: T-CBC w/Diff (04540-98119) T-Sed Rate (Automated) (14782-95621) Diagnostic X-Ray/Fluoroscopy (Diagnostic X-Ray/Flu) Consultation Level IV (30865)  Orders: Est. Patient Level IV (78469)  Problem # 2:  Hx of DEEP VENOUS THROMBOPHLEBITIS (ICD-453.40) Assessment: Unchanged  Problem # 3:  LOW BACK PAIN SYNDROME (ICD-724.2) She wants to get a steroid injection her her neck.  i see no reason not to get  this at this point as she is only on suppressive abx.  Her updated medication list for this problem includes:    Oxycodone-acetaminophen 5-325 Mg Tabs (Oxycodone-acetaminophen) .Marland Kitchen... 1 every 6 hours as needed pain    Naproxen 250 Mg Tabs (Naproxen) .Marland Kitchen... Take 1-2 tabs by mouth two times a day as needed for pain...  Patient Instructions: 1)  Please schedule a follow-up appointment in 6 weeks. 2)  Take keflex twice a day for one week then if leg is ok go down to once  a day. 3)  Continue wraps and compression stockings. 4)  Call if worsens. Prescriptions: KEFLEX 500 MG CAPS (CEPHALEXIN) 1 tablet 4 times daily  #  120 x 1   Entered and Authorized by:   Clydie Braun MD   Signed by:   Clydie Braun MD on 07/10/2009   Method used:   [facility] to        [facility] [address] [phone]* (retail)       [address]       [place], [place]  [address]       Ph: [phone] or [phone]       Fax: [phone]   RxID:   [id number]

## 2010-06-11 NOTE — Progress Notes (Signed)
Summary: CT angio head  Phone Note Call from Patient   Caller: Patient Call For: Burnett Spray Summary of Call: pt wants a CT scheduled. says she had one in 4/09 and is supposed to get another one in 10 / 09. ALSO: requests to speak to sn re: dizzy spells she has been having. i did offer an appt w/ tp in the meantime, but she wanted to wait and hear fro sn regarding this. no other complaints. pt just left from having flu shot so don't call for about half an hour or so.  Initial call taken by: Tivis Ringer,  February 07, 2008 3:06 PM  Follow-up for Phone Call        Please advise regarding dizziness and ordering CT scan. Michel Bickers [facility]  February 07, 2008 4:47 PM  called spoke with patient; informed her that her last CT was in May, so the next is in November.  also informed patinet that she will need an OV to discuss those results per SN.  will send order for CT angio to [facility].  per SN, okay to send in Antivert 25mg , #30, 1/2-1 tab by mouth four times a day for dizziness.  sent to pharmacy.  pt okay with all of this. Follow-up by: Boone Master CNA,  February 08, 2008 5:17 PM  Additional Follow-up for Phone Call Additional follow up Details #1::        1) Labs to be drawn week of Nov 23. Pt aware  2) CTA Head w/wo contrast scheduled for 04/08/08 at 11:00 at [facility]. NPO 2 hours before. Pt aware. 3) Appt with Dr. San Jetty 04/11/08 at 10:00. Pt aware and appt cards mailed to pt for all the above appts. Additional Follow-up by: Alfonso Ramus,  February 13, 2008 5:40 PM    New/Updated Medications: ANTIVERT 25 MG TABS (MECLIZINE HCL) 1/2 to 1 tab by moth four times a day as needed for dizziness.   Prescriptions: ANTIVERT 25 MG TABS (MECLIZINE HCL) 1/2 to 1 tab by moth four times a day as needed for dizziness.  #30 x 0   Entered by:   Boone Master CNA   Authorized by:   Michele Mcalpine MD   Signed by:   Boone Master CNA on 02/08/2008   Method used:   [facility] to        [facility] [phone]* (retail)       [address]       [place], [place]  [address]       Ph: [phone] or [phone]       Fax: [phone]   RxID:   [id number]

## 2010-06-11 NOTE — Letter (Signed)
Summary: [facility]  [facility]   Imported By: Sherian Rein 10/27/2009 11:25:25  _____________________________________________________________________  External Attachment:    Type:   Image     Comment:   External Document

## 2010-06-11 NOTE — Letter (Signed)
Summary: [facility]  [facility]   Imported By: Lester  11/22/2008 08:13:44  _____________________________________________________________________  External Attachment:    Type:   Image     Comment:   External Document

## 2010-06-11 NOTE — Assessment & Plan Note (Signed)
Summary: flu shot/apc   Nurse Visit    Prior Medications: NASONEX 50 MCG/ACT  SUSP (MOMETASONE FUROATE) 2 sp in each nostril at bedtime... ADULT ASPIRIN LOW STRENGTH 81 MG  TBDP (ASPIRIN) Take 1 tablet by mouth once a day LOPRESSOR 50 MG TABS (METOPROLOL TARTRATE) take 1/2 tablet by mouth every morning and take 1/2 tablet by mouth every evening LIPITOR 20 MG TABS (ATORVASTATIN CALCIUM) take 1/2 tablet by mouth once daily ACIPHEX 20 MG TBEC (RABEPRAZOLE SODIUM) take one tablet by mouth once daily as needed VIVELLE-DOT 0.1 MG/24HR  PTTW (ESTRADIOL) use one patch two times a week CENTRUM SILVER   TABS (MULTIPLE VITAMINS-MINERALS) Take 1 tablet by mouth once a day VITAMIN D3 1000 UNIT  TABS (CHOLECALCIFEROL) take 2 tablets by mouth once daily MIDRIN 325-65-100 MG  CAPS (APAP-ISOMETHEPTENE-DICHLORAL) take 1 tab by mouth Q 4-6 H as needed for headache pain... MOBIC 7.5 MG  TABS (MELOXICAM) take 1 tab by mouth once daily for arthritis pain... SKELAXIN 800 MG  TABS (METAXALONE) take 1 tab by mouth three times a day as needed for muscle spasm... AMBIEN 10 MG  TABS (ZOLPIDEM TARTRATE) 1 by mouth at bedtime as needed for sleep Current Allergies: ! PENICILLIN ! CODEINE    Orders Added: 1)  Flu Vaccine 12yrs + [09811] 2)  Administration Flu vaccine [G0008]    ] Flu Vaccine Consent Questions     Do you have a history of severe allergic reactions to this vaccine? no    Any prior history of allergic reactions to egg and/or gelatin? no    Do you have a sensitivity to the preservative Thimersol? no    Do you have a past history of Guillan-Barre Syndrome? no    Do you currently have an acute febrile illness? no    Have you ever had a severe reaction to latex? no    Vaccine information given and explained to patient? yes    Are you currently pregnant? no    Lot Number:[id number]   Site Given  Left Deltoid IM Azucena Kuba, CMA  February 06, 2008

## 2010-06-11 NOTE — Assessment & Plan Note (Signed)
Summary: ov- swollen ankle- negative doppler and xray- very painful/ddp   Primary Provider/Referring Provider:  Alroy Dust, MD  CC:  right foot/ankle pain that radiats into the leg.  swelling and redness.  pt states she doesn't remember hitting her foot or falling.Marland Kitchen  History of Present Illness: 77 with known hx of HTN, tachyarrythmias, DVT, GERD, Hyperlipidemia  April 01, 2009--Presents with  5 days hx. of right outter ankle swelling and redness.  Pt states the swelling and redness of ther R. ankle started  5 days ago, over last 3 days worse-  she couldn't bear weigh on it without pain.  She went to the ED on 2 days ago,   R.ankle x-ray showed soft tissue swelling  and calcification off the lateral malleolus compatible with old trauma.  Her RLE doppler showed neg. for DVT.  She got d/c on Percocet for pain but the pain is still intense with a light touch.  Denies rigth leg injury or insect bite, rash, chest pain, dyspnea, orthopnea, hemoptysis, fever, n/v/d, headache,recent travel or antibiotics.  Atypical Headaches--- Presented 3/09 w/ headache that sounded like cervicogenic HA, and CSpine films showed degen spondylosis & facet hypertrophy- Rx'd w/ MOBIC 7.5mg /d & SKELAXIN 800mg Tid, heat, etc...  she also c/o "sinus" symptoms Rx'd w/ Avelox, Nasonex, saline etc (symptoms improved)... 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  She was referred to [facility] for Neurology who concurred, and rec physical therapy for her neck and ZANAFLEX... this helped some and her pain is improved... she notes that she cannot tolerate the Percocet (nausea) and that DCN does not work for her... she is asking about Oxycontin which she apparently had yrs ago after knee surgery, but I have denied this... we will call in some MIDRIN for her to try for the headaches as needed...   In the meanwhile we  contacted her relatives at [facility] and I sent her scan and notes to Dr. Annamary Rummage at the Dept of Radiology [phone] [address] [place] [address]... their review indicated that they felt her aneurysm was coming off the supraclinoid segment of the left ICA and they requested that we perform a CT Angio for further images... this was done 09/08/07 and showed a 5mm aneurysm projecting posteriorly from the junction of the cavernous and supraclinoid segments of the left ICA... we sent a disc of the study to DrStone as he wanted to measure the size of the aneurysms mouth (helpful in determining whether to plan a coil or stent later)... he asked [place] to repeat the CT Angio is 6 months...  Problems Prior to Update: 1)  Ankle Pain  (ICD-719.47) 2)  Atrial Fibrillation  (ICD-427.31) 3)  Dizziness  (ICD-780.4) 4)  Encounter For Long-term Use of Anticoagulants  (ICD-V58.61) 5)  Intracranial Aneurysm  (ICD-437.3) 6)  Hx of Tachyarrhythmia  (ICD-785.0) 7)  Hx of Deep Venous Thrombophlebitis  (ICD-453.40) 8)  Hyperlipidemia  (ICD-272.4) 9)  Gerd  (ICD-530.81) 10)  Diverticulosis of Colon  (ICD-562.10) 11)  Colonic Polyps  (ICD-211.3) 12)  Hx of Renal Calculus  (ICD-592.0) 13)  Degenerative Joint Disease  (ICD-715.90) 14)  Low Back Pain Syndrome  (ICD-724.2) 15)  Neck Pain  (ICD-723.1) 16)  Headache  (ICD-784.0)  Current Problems (verified): 1)  Ankle Pain  (ICD-719.47) 2)  Atrial Fibrillation  (ICD-427.31) 3)  Dizziness  (ICD-780.4) 4)  Encounter For Long-term Use  of Anticoagulants  (ICD-V58.61) 5)  Intracranial Aneurysm  (ICD-437.3) 6)  Hx of Tachyarrhythmia  (ICD-785.0) 7)  Hx of Deep Venous Thrombophlebitis  (ICD-453.40) 8)  Hyperlipidemia  (ICD-272.4) 9)  Gerd  (ICD-530.81) 10)  Diverticulosis of Colon  (ICD-562.10) 11)  Colonic Polyps  (ICD-211.3) 12)  Hx of Renal Calculus  (ICD-592.0) 13)  Degenerative Joint Disease  (ICD-715.90) 14)  Low Back Pain Syndrome   (ICD-724.2) 15)  Neck Pain  (ICD-723.1) 16)  Headache  (ICD-784.0)  Medications Prior to Update: 1)  Adult Aspirin Low Strength 81 Mg  Tbdp (Aspirin) .... Take 1 Tablet By Mouth Once A Day 2)  Lopressor 50 Mg Tabs (Metoprolol Tartrate) .... Take 1/2 Tablet By Mouth Every Morning and Take 1/2 Tablet By Mouth Every Evening 3)  Lipitor 20 Mg Tabs (Atorvastatin Calcium) .... Take 1/2 Tablet By Mouth Once Daily 4)  Vitamin D3 1000 Unit  Tabs (Cholecalciferol) .... Take 2 Tablets By Mouth Once Daily 5)  Ambien 10 Mg  Tabs (Zolpidem Tartrate) .Marland Kitchen.. 1 By Mouth At Bedtime As Needed For Sleep 6)  Fish Oil 1000 Mg Caps (Omega-3 Fatty Acids) .... 2 Caps Once Daily 7)  Pradaxa 150 Mg Tabs .... Take Two Times A Day  Current Medications (verified): 1)  Adult Aspirin Low Strength 81 Mg  Tbdp (Aspirin) .... Take 1 Tablet By Mouth Once A Day 2)  Lopressor 50 Mg Tabs (Metoprolol Tartrate) .... Take 1/2 Tablet By Mouth Every Morning and Take 1/2 Tablet By Mouth Every Evening 3)  Lipitor 20 Mg Tabs (Atorvastatin Calcium) .... Take 1/2 Tablet By Mouth Once Daily 4)  Vitamin D3 1000 Unit  Tabs (Cholecalciferol) .... Take 2 Tablets By Mouth Once Daily 5)  Ambien 10 Mg  Tabs (Zolpidem Tartrate) .Marland Kitchen.. 1 By Mouth At Bedtime As Needed For Sleep 6)  Fish Oil 1000 Mg Caps (Omega-3 Fatty Acids) .... 2 Caps Once Daily 7)  Pradaxa 150 Mg Tabs .... Take Two Times A Day 8)  Colace 100 Mg Caps (Docusate Sodium) .... Take 1 Capsule By Mouth Three Times A Day X7days 9)  Oxycodone-Acetaminophen 5-325 Mg Tabs (Oxycodone-Acetaminophen) .Marland Kitchen.. 1 Every 6 Hours As Needed Pain 10)  Ibuprofen 400 Mg Tabs (Ibuprofen) .... Take 1 Tablet By Mouth Three Times A Day X7days 11)  Famotidine 20 Mg Tabs (Famotidine) .... Take 1 Tablet By Mouth Two Times A Day X7days  Allergies (verified): 1)  ! Penicillin 2)  ! Codeine  Past History:  Past Surgical History: Last updated: 03/03/2009 S/P T & A S/P nasal surgery in past S/P appendectomy   S/P hysterectomy S/P breast cyst removal S/P hemorroid surgery x 2 S/P ELap for SBO w/ lysis of adhesions 1984 S/P left knee surg in 1997 S/P right TKR in 2004 by Dr Thurston Hole  Family History: Last updated: 2009/04/27 Mother-deceased, MI age 59 Father-deceased, MI age 19 Brother deceased alcohol cirrhosis  Social History: Last updated: 03/03/2009 No alcohol, tobacco drugs Divorced 3 children, 4 grandchildren, 2 great grandchildren Worked in cigarette factory  Risk Factors: Smoking Status: never (04/11/2008)  Past Medical History: SINUSITIS, ACUTE (ICD-461.9) - she uses Nasal Saline Mist & NASONEX as needed... she saw DrRosen for ENT w/ deviated septum found, otherw neg, but it's hard for her to breathe thru her nose...  HYPERTENSION (ICD-401.9) - controlled on LOPRESSOR 50mg - 1/2 tab Bid.   ~  2DEcho 1996 showed mild LVH, norm valves, EF=60%...  ~  NuclearStressTest 4/04 was neg- no infarct or ischemia, EF=69%... similiar to 1999  study.  Hx of TACHYARRHYTHMIA (ICD-785.0) - hx atrial tachycardia and AV nodal reentry... s/p ablation, no recurrent symptoms... last saw [facility] 4/08 and she will need f/u with one of his partners now that he has retired.  Hx of DEEP VENOUS THROMBOPHLEBITIS (ICD-453.40) - she had a DVT and a small PE after knee surgery in 1997... subseq eval of her veins by the vasc surgeons showed venous stasis disease...  HYPERLIPIDEMIA (ICD-272.4) - on LIPITOR 20mg - 1/2 tab daily... tolerates Rx well...  ~  FLP 11/08 in the Lipid Clinic showing TChol 148, TG 84, HDL 53, LDL 78... LFT's were WNL.Marland Kitchen.  ~  FLP 10/09 showed TChol 162, TG 85, HDL 63, LDL 83...  GERD (ICD-530.81) - last EGD was 3/06 showing chr gastritis & duodenitis... she takes ACIPHEX 20mg /d...  DIVERTICULOSIS OF COLON (ICD-562.10) & COLONIC POLYPS (ICD-211.3) - last colonoscopy was 1/07 by DrSam showing divertics only... f/u planned 24yrs... (last polyp was 1999 tiny & cauterized).  Hx of RENAL  CALCULUS (ICD-592.0) - stones seen in kidney on CTAbd 2007, non-obstructing & no hx of symptomatic nephrolithiasis...  DEGENERATIVE JOINT DISEASE (ICD-715.90) - prev hx of arthroscopy w/ DVT and PTE in 1995... she had a right TKR by DrWainer 4/04...   LOW BACK PAIN SYNDROME (ICD-724.2) - she has had LBP evauated by Ortho- DrWainer, DrGioffre; and by Neurosurg- DrJenkins in 2003... MRI showed a disc at L3-4 w/ mild spinal stenosis, but he did not rec surg...    GYN = DrLomax & last seen 12/08... on VIVELLE & Ca++/ VitD... he did BMD i  Review of Systems  The patient denies anorexia, fever, vision loss, decreased hearing, hoarseness, chest pain, syncope, dyspnea on exertion, prolonged cough, headaches, hemoptysis, abdominal pain, melena, hematochezia, severe indigestion/heartburn, hematuria, muscle weakness, suspicious skin lesions, unusual weight change, and abnormal bleeding.         c/o R. outter ankle swelling and redness.  Vital Signs:  Patient profile:   77 year old female Height:      66 inches Weight:      175.25 pounds BMI:     28.39 O2 Sat:      93 % on Room air Temp:     98.3 degrees F oral Pulse rate:   73 / minute BP sitting:   140 / 80  (right arm) Cuff size:   regular  Vitals Entered By: Boone Master CNA (April 01, 2009 11:12 AM)  O2 Flow:  Room air CC: right foot/ankle pain that radiats into the leg.  swelling, redness.  pt states she doesn't remember hitting her foot or falling. Is Patient Diabetic? No Comments Medications reviewed with patient Daytime contact number verified with patient. Boone Master CNA  April 01, 2009 11:21 AM    Physical Exam  Additional Exam:  GEN: A/Ox3; pleasant , NAD HEENT:  [place]/AT, , EACs-clear, TMs-wnl, NOSE-clear, THROAT-clear NECK:  Supple w/ fair ROM; no JVD; normal carotid impulses w/o bruits; no thyromegaly or nodules palpated; no lymphadenopathy. RESP  Clear to P & A; w/o, wheezes/ rales/ or rhonchi. CARD:  RRR, no  m/r/g   R. ankle and pedal swelling and redness along lateral right ankle, very tender  + 2 edema, +1 pedal pulse GI:   Soft & nt; nml bowel sounds; no organomegaly or masses detected. Musco: Warm bil,  no calf tenderness edema, clubbing, pulses intact    Impression & Recommendations:  Problem # 1:  ANKLE PAIN (ICD-719.47) ? etiology will check uric acid level to r/o  gout (d/t severe pain) Cover for possible cellulitis , wound cx pending, cover for Ca-MRSA REC:  Doxycycline 100mg  two times a day for 10 days check uric acid level. Foot elevated.  Warm compresses to ankle   Orders: TLB-CBC Platelet - w/Differential (85025-CBCD) TLB-BMP (Basic Metabolic Panel-BMET) (80048-METABOL) TLB-Uric Acid, Blood (84550-URIC) T-Culture, Wound (87070/87205-70190) Est. Patient Level IV (04540)  Medications Added to Medication List This Visit: 1)  Colace 100 Mg Caps (Docusate sodium) .... Take 1 capsule by mouth three times a day x7days 2)  Oxycodone-acetaminophen 5-325 Mg Tabs (Oxycodone-acetaminophen) .Marland Kitchen.. 1 every 6 hours as needed pain 3)  Ibuprofen 400 Mg Tabs (Ibuprofen) .... Take 1 tablet by mouth three times a day x7days 4)  Famotidine 20 Mg Tabs (Famotidine) .... Take 1 tablet by mouth two times a day x7days 5)  Doxycycline Hyclate 100 Mg Caps (Doxycycline hyclate) .Marland Kitchen.. 1 by mouth two times a day  Complete Medication List: 1)  Adult Aspirin Low Strength 81 Mg Tbdp (Aspirin) .... Take 1 tablet by mouth once a day 2)  Lopressor 50 Mg Tabs (Metoprolol tartrate) .... Take 1/2 tablet by mouth every morning and take 1/2 tablet by mouth every evening 3)  Lipitor 20 Mg Tabs (Atorvastatin calcium) .... Take 1/2 tablet by mouth once daily 4)  Vitamin D3 1000 Unit Tabs (Cholecalciferol) .... Take 2 tablets by mouth once daily 5)  Ambien 10 Mg Tabs (Zolpidem tartrate) .Marland Kitchen.. 1 by mouth at bedtime as needed for sleep 6)  Fish Oil 1000 Mg Caps (Omega-3 fatty acids) .... 2 caps once daily 7)  Pradaxa 150  Mg Tabs  .... Take two times a day 8)  Colace 100 Mg Caps (Docusate sodium) .... Take 1 capsule by mouth three times a day x7days 9)  Oxycodone-acetaminophen 5-325 Mg Tabs (Oxycodone-acetaminophen) .Marland Kitchen.. 1 every 6 hours as needed pain 10)  Ibuprofen 400 Mg Tabs (Ibuprofen) .... Take 1 tablet by mouth three times a day x7days 11)  Famotidine 20 Mg Tabs (Famotidine) .... Take 1 tablet by mouth two times a day x7days 12)  Doxycycline Hyclate 100 Mg Caps (Doxycycline hyclate) .Marland Kitchen.. 1 by mouth two times a day  Patient Instructions: 1)  Doxycycline 100mg  two times a day for 10 days 2)  Foot elevated.  3)  Warm compresses to ankle  4)  I will call with lab results.  5)  follow up 1 week 6)  Please contact office for sooner follow up if symptoms do not improve or worsen  Prescriptions: DOXYCYCLINE HYCLATE 100 MG CAPS (DOXYCYCLINE HYCLATE) 1 by mouth two times a day  #20 x 0   Entered and Authorized by:   Rubye Oaks NP   Signed by:   Rubye Oaks NP on 04/01/2009   Method used:   Electronically to        [facility] [address] [phone]* (retail)       [address]       [place], [place]  [address]       Ph: [phone] or [phone]       Fax: [phone]   RxID:   [id number]

## 2010-06-11 NOTE — Progress Notes (Signed)
  Phone Note Other Incoming   Caller: Irving Burton Summary of Call: Received CB from [place] with fax # [phone]. Letter faxed and pt aware.  Initial call taken by: Duncan Dull, RN, BSN,  April 29, 2009 9:03 AM

## 2010-06-11 NOTE — Assessment & Plan Note (Signed)
Summary: discuss options/apc   Chief Complaint:  1 month ROV....  History of Present Illness: 77 y/o WF here for a follow up visit... please see my EMR notes of Sep 01, 2022 and 4/8- reviewed...  Presented 09/01/22 w/ headache that sounded like cervicogenic HA, and CSpine films showed degen spondylosis & facet hypertrophy- Rx'd w/ MOBIC 7.5mg /d & SKELAXIN 800mg Tid, heat, etc...  she also c/o "sinus" symptoms Rx'd w/ Avelox, Nasonex, saline etc (symptoms improved)... 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  She was referred to [facility] for Neurology who concurred, and rec physical therapy for her neck and ZANAFLEX... this has helped some and her pain is improved... she notes that she cannot tolerate the Percocet (nausea) and that DCN does not work for her... she is asking about Oxycontin which she apparently had yrs ago after knee surgery, but I have denied this... we will call in some MIDRIN for her to try for the headaches as needed...   In the meanwhile we contacted her relatives at [facility] and I sent her scan and notes to Dr. Annamary Rummage at the Dept of Radiology [phone] [address] [place] [address]... their review indicated that they felt her aneurysm was coming off the supraclinoid segment of the left ICA and they requested that we perform a CT Angio for further images... this was done 09/08/07 and showed a 5mm aneurysm projecting posteriorly from the junction of the cavernous and supraclinoid segments of the left ICA... we sent a disc of the study to DrStone as he wanted to measure the size of the aneurysms mouth (helpful in determining whether to plan a coil or stent later)... he asked [place] to repeat the CT Angio is 6 months...  Current Problem List:  SINUSITIS, ACUTE (ICD-461.9) - she is finishing the course of AVELOX, Nasal Saline Mist, & NASONEX... rec to continue  the Saline, Nasonex and add MUCINEX OTC 1-2 tabsBid...  HYPERTENSION (ICD-401.9) - controlled on LOPRESSOR 50mg - 1/2 tab Bid... BP today is 126/70...  ~  2DEcho 1996 showed mild LVH, norm valves, EF=60%...  ~  NuclearStressTest 4/04 was neg- no infarct or ischemia, EF=69%... similiar to 1999 study.  Hx of TACHYARRHYTHMIA (ICD-785.0) - hx atrial tachycardia and AV nodal reentry... s/p ablation, no recurrent symptoms... last saw [facility] 4/08 and she will need f/u with one of his partners now that he has retired.  Hx of DEEP VENOUS THROMBOPHLEBITIS (ICD-453.40) - she had a DVT and a small PE after knee surgery in 1997... subseq eval of her veins by the vasc surgeons showed venous stasis disease...  HYPERLIPIDEMIA (ICD-272.4) - on LIPITOR 20mg - 1/2 tab daily... tolerates Rx well...  ~  last FLP was 11/08 in the Lipid Clinic showing TChol 148, TG 84, HDL 53, LDL 78... LFT's were WNL...  GERD (ICD-530.81) - last EGD was 3/06 showing chr gastritis & duodenitis... she takes ACIPHEX 20mg /d... DIVERTICULOSIS OF COLON (ICD-562.10) & COLONIC POLYPS (ICD-211.3) - last colonoscopy was 1/07 by DrSam showing divertics only... f/u planned 43yrs... (last polyp was 1999 tiny & cauterized).  Hx of RENAL CALCULUS (ICD-592.0) - stones seen in kidney on CTAbd 2007, non-obstructing & no hx of symptomatic nephrolithiasis...  DEGENERATIVE JOINT DISEASE (ICD-715.90) - prev hx of arthroscopy w/ DVT and PTE in 1995... she had a right TKR by DrWainer 4/04...  LOW BACK PAIN SYNDROME (ICD-724.2) - she has had LBP evauated by  Ortho- DrWainer, DrGioffre; and by Neurosurg- DrJenkins in 2003... MRI showed a disc at L3-4 w/ mild spinal stenosis, but he did not rec surg...   NECK PAIN        <<  see above  >> HEADACHE  GYN = DrLomax & last seen 12/08... on VIVELLE & Ca++/ VitD... he did BMD in 10/07 w/ osteopenia...     Current Allergies (reviewed today): ! PENICILLIN ! CODEINE  Past Medical History:         SINUSITIS, ACUTE (ICD-461.9)    HYPERTENSION (ICD-401.9)    Hx of TACHYARRHYTHMIA (ICD-785.0)    Hx of DEEP VENOUS THROMBOPHLEBITIS (ICD-453.40)    HYPERLIPIDEMIA (ICD-272.4)    GERD (ICD-530.81)    DIVERTICULOSIS OF COLON (ICD-562.10)    COLONIC POLYPS (ICD-211.3)    Hx of RENAL CALCULUS (ICD-592.0)    DEGENERATIVE JOINT DISEASE (ICD-715.90)    LOW BACK PAIN SYNDROME (ICD-724.2)    NECK PAIN (ICD-723.1)    HEADACHE (ICD-784.0)      Past Surgical History:    S/P T & A    S/P nasal surgery in past    S/P appendectomy     S/P hysterectomy    S/P breast cyst removal    S/P hemorroid surgery    S/P ELap for SBO w/ lysis of adhesions 1984    S/P left knee surg in 1997    S/P right TKR in 2004 by DrWainer     Review of Systems  The patient denies anorexia, fever, weight loss, weight gain, vision loss, decreased hearing, hoarseness, chest pain, syncope, peripheral edema, prolonged cough, hemoptysis, abdominal pain, melena, hematochezia, severe indigestion/heartburn, hematuria, incontinence, muscle weakness, suspicious skin lesions, transient blindness, difficulty walking, depression, unusual weight change, abnormal bleeding, enlarged lymph nodes, and angioedema.     Vital Signs:  Patient Profile:   77 Years Old Female Weight:      166.38 pounds O2 Sat:      98 % O2 treatment:    Room Air Temp:     97.0 degrees F oral Pulse rate:   55 / minute BP sitting:   130 / 76  (left arm)  Vitals Entered By: Vernie Murders (Sep 19, 2007 11:54 AM)                 Physical Exam  WD, WN, 77 y/o WF in NAD... GENERAL:  Alert & oriented; pleasant & cooperative... HEENT:  [place]/AT, EOM-wnl, PERRLA, EACs-clear, TMs-wnl, NOSE- sl red, THROAT- no exud seen... NECK:  Supple w/ decr ROM; no JVD; normal carotid impulses w/o bruits; no thyromegaly or nodules palpated; no lymphadenopathy. CHEST:  Clear to P & A; without wheezes/ rales/ or rhonchi. HEART:  Regular Rhythm; msc gr 1/6 SEM without  rubs or gallops heard... ABDOMEN:  Soft & nontender; normal bowel sounds; no organomegaly or masses detected. EXT: without deformities, mod arthritic changes; no varicose veins/ +venous insuffic/ no edema. NEURO:  CN's intact;  no focal neuro deficit... DERM:  No lesions noted; no rash etc...       Impression & Recommendations:  Problem # 1:  HEADACHE (ICD-784.0) Assessment: Improved She has been seen by Neurology... the HA's seem more cervicogenic... continue Rx and PT per Neuro... Her updated medication list for this problem includes:    Adult Aspirin Low Strength 81 Mg Tbdp (Aspirin) .Marland Kitchen... Take 1 tablet by mouth once a day    Lopressor 50 Mg Tabs (Metoprolol tartrate) .Marland Kitchen... Take 1/2 tablet by mouth every morning and take 1/2  tablet by mouth every evening    Midrin 325-65-100 Mg Caps (Apap-isometheptene-dichloral) .Marland Kitchen... Take 1 tab by mouth q 4-6 h as needed for headache pain...    Mobic 7.5 Mg Tabs (Meloxicam) .Marland Kitchen... Take 1 tab by mouth once daily for arthritis pain...   Problem # 2:  INTRACRANIAL ANEURYSM (ICD-437.3) Assessment: Unchanged Incidental finding... reassurance given... we will plan a follow up CTAngio in 6months.  Problem # 3:  SINUSITIS, ACUTE (ICD-461.9) Assessment: Improved Improved... continue Rx. Her updated medication list for this problem includes:    Nasonex 50 Mcg/act Susp (Mometasone furoate) .Marland Kitchen... 2 sp in each nostril at bedtime...   Problem # 4:  HYPERTENSION (ICD-401.9) Assessment: Unchanged Well controlled... continue meds and low sodium diet... Her updated medication list for this problem includes:    Lopressor 50 Mg Tabs (Metoprolol tartrate) .Marland Kitchen... Take 1/2 tablet by mouth every morning and take 1/2 tablet by mouth every evening   Problem # 5:  HYPERLIPIDEMIA (ICD-272.4) Assessment: Unchanged Continue Lipitor... Her updated medication list for this problem includes:    Lipitor 20 Mg Tabs (Atorvastatin calcium) .Marland Kitchen... Take 1/2 tablet by mouth  once daily   Problem # 6:  DEGENERATIVE JOINT DISEASE (ICD-715.90) Assessment: Unchanged Continue therapy... Her updated medication list for this problem includes:    Adult Aspirin Low Strength 81 Mg Tbdp (Aspirin) .Marland Kitchen... Take 1 tablet by mouth once a day    Mobic 7.5 Mg Tabs (Meloxicam) .Marland Kitchen... Take 1 tab by mouth once daily for arthritis pain...   Complete Medication List: 1)  Nasonex 50 Mcg/act Susp (Mometasone furoate) .... 2 sp in each nostril at bedtime.Marland KitchenMarland Kitchen 2)  Adult Aspirin Low Strength 81 Mg Tbdp (Aspirin) .... Take 1 tablet by mouth once a day 3)  Lopressor 50 Mg Tabs (Metoprolol tartrate) .... Take 1/2 tablet by mouth every morning and take 1/2 tablet by mouth every evening 4)  Lipitor 20 Mg Tabs (Atorvastatin calcium) .... Take 1/2 tablet by mouth once daily 5)  Aciphex 20 Mg Tbec (Rabeprazole sodium) .... Take one tablet by mouth once daily as needed 6)  Vivelle-dot 0.1 Mg/24hr Pttw (Estradiol) .... Use one patch two times a week 7)  Centrum Silver Tabs (Multiple vitamins-minerals) .... Take 1 tablet by mouth once a day 8)  Vitamin D3 1000 Unit Tabs (Cholecalciferol) .... Take 2 tablets by mouth once daily 9)  Midrin 325-65-100 Mg Caps (Apap-isometheptene-dichloral) .... Take 1 tab by mouth q 4-6 h as needed for headache pain... 10)  Mobic 7.5 Mg Tabs (Meloxicam) .... Take 1 tab by mouth once daily for arthritis pain... 11)  Skelaxin 800 Mg Tabs (Metaxalone) .... Take 1 tab by mouth three times a day as needed for muscle spasm...   Patient Instructions: 1)  Today we changed your headache pain pill to [facility]... you may continue the Mobic, & Skelaxin or Zanaflex as a muscle relaxer...  2)  Please schedule a follow-up appointment in 5-6 months, we will set up a repeat CTAngio after we see you and recheck your blood work.   ]

## 2010-06-11 NOTE — Assessment & Plan Note (Signed)
Summary: FOLLOW UP CTA HEAD/RJC   Chief Complaint:  7 month ROV....  History of Present Illness: 77 y/o WF here for a follow up visit... please see my prev EMR notes for details- reviewed:  Presented 3/09 w/ headache that sounded like cervicogenic HA, and CSpine films showed degen spondylosis & facet hypertrophy- Rx'd w/ MOBIC 7.5mg /d & SKELAXIN 800mg Tid, heat, etc...  she also c/o "sinus" symptoms Rx'd w/ Avelox, Nasonex, saline etc (symptoms improved)... 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  She was referred to [facility] who concurred, and rec physical therapy for her neck and ZANAFLEX... this helped some and her pain is improved... she notes that she cannot tolerate the Percocet (nausea) and that DCN does not work for her... she is asking about Oxycontin which she apparently had yrs ago after knee surgery, but I have denied this... we will call in some MIDRIN for her to try for the headaches as needed...   In the meanwhile we contacted her relatives at [facility] and I sent her scan and notes to Dr. Annamary Rummage at the Dept of Radiology [phone] [id number] [place] [address]... their review indicated that they felt her aneurysm was coming off the supraclinoid segment of the left ICA and they requested that we perform a CT Angio for further images... this was done 09/08/07 and showed a 5mm aneurysm projecting posteriorly from the junction of the cavernous and supraclinoid segments of the left ICA... we sent a disc of the study to DrStone as he wanted to measure the size of the aneurysms mouth (helpful in determining whether to plan a coil or stent later)... he asked [place] to repeat the CT Angio is 6 months...   ~  April 11, 2008:  Samantha Bishop informs me that her granddaughter has broken off her engagement to DrStone at [place] and she will need a local  Interventional Radiologist to follow this aneurysm- I have recommended Dr. Kerby Nora & we will set up an appt for her-  her f/u CTAngio 04/08/08 showed no significant change and this is reassuring... she tells me that if she needs surg she wants to go to [facility] to see [facility] doctor... she no longer has any dizziness and she states "my head is better"...    Current Problem List:  SINUSITIS, ACUTE (ICD-461.9) - she uses Nasal Saline Mist & NASONEX as needed... she saw DrRosen for ENT w/ deviated septum found, otherw neg, but it's hard for her to breathe thru her nose...  HYPERTENSION (ICD-401.9) - controlled on LOPRESSOR 50mg - 1/2 tab Bid... BP today is 128/70...  ~  2DEcho 1996 showed mild LVH, norm valves, EF=60%...  ~  NuclearStressTest 4/04 was neg- no infarct or ischemia, EF=69%... similiar to 1999 study.  Hx of TACHYARRHYTHMIA (ICD-785.0) - hx atrial tachycardia and AV nodal reentry... s/p ablation, no recurrent symptoms... last saw [facility] 4/08 and she will need f/u with one of his partners now that he has retired.  Hx of DEEP VENOUS THROMBOPHLEBITIS (ICD-453.40) - she had a DVT and a small PE after knee surgery in 1997... subseq eval of her veins by the vasc surgeons showed venous stasis disease...  HYPERLIPIDEMIA (ICD-272.4) - on LIPITOR 20mg - 1/2 tab daily... tolerates Rx well...  ~  FLP 11/08 in the [facility] showing TChol 148, TG 84, HDL 53, LDL 78... LFT's were WNL.Marland KitchenMarland Kitchen  ~  FLP 10/09 showed TChol 162, TG 85, HDL 63, LDL 83...  GERD (ICD-530.81) - last EGD was 3/06 showing chr gastritis & duodenitis... she takes ACIPHEX 20mg /d...  DIVERTICULOSIS OF COLON (ICD-562.10) & COLONIC POLYPS (ICD-211.3) - last colonoscopy was 1/07 by DrSam showing divertics only... f/u planned 10yrs... (last polyp was 1999 tiny & cauterized).  Hx of RENAL CALCULUS (ICD-592.0) - stones seen in kidney on CTAbd 2007, non-obstructing & no hx of symptomatic nephrolithiasis...  DEGENERATIVE JOINT  DISEASE (ICD-715.90) - prev hx of arthroscopy w/ DVT and PTE in 1995... she had a right TKR by DrWainer 4/04...   LOW BACK PAIN SYNDROME (ICD-724.2) - she has had LBP evauated by Ortho- DrWainer, DrGioffre; and by Neurosurg- DrJenkins in 2003... MRI showed a disc at L3-4 w/ mild spinal stenosis, but he did not rec surg...   NECK PAIN        <<  see above  >> HEADACHE  GYN = DrLomax & last seen 12/08... on VIVELLE & Ca++/ VitD... he did BMD in 10/07 w/ osteopenia...      Current Allergies (reviewed today): ! PENICILLIN ! CODEINE  Past Medical History:        SINUSITIS, ACUTE (ICD-461.9)    HYPERTENSION (ICD-401.9)    Hx of TACHYARRHYTHMIA (ICD-785.0)    Hx of DEEP VENOUS THROMBOPHLEBITIS (ICD-453.40)    HYPERLIPIDEMIA (ICD-272.4)    GERD (ICD-530.81)    DIVERTICULOSIS OF COLON (ICD-562.10)    COLONIC POLYPS (ICD-211.3)    Hx of RENAL CALCULUS (ICD-592.0)    DEGENERATIVE JOINT DISEASE (ICD-715.90)    LOW BACK PAIN SYNDROME (ICD-724.2)    NECK PAIN (ICD-723.1)    HEADACHE (ICD-784.0)      Past Surgical History:    S/P T & A    S/P nasal surgery in past    S/P appendectomy     S/P hysterectomy    S/P breast cyst removal    S/P hemorroid surgery    S/P ELap for SBO w/ lysis of adhesions 1984    S/P left knee surg in 1997    S/P right TKR in 2004 by DrWainer    Risk Factors:  Tobacco use:  never   Review of Systems  The patient denies anorexia, fever, weight loss, weight gain, vision loss, decreased hearing, hoarseness, chest pain, syncope, dyspnea on exertion, peripheral edema, prolonged cough, headaches, hemoptysis, abdominal pain, melena, hematochezia, severe indigestion/heartburn, hematuria, incontinence, muscle weakness, suspicious skin lesions, transient blindness, difficulty walking, depression, unusual weight change, abnormal bleeding, enlarged lymph nodes, and angioedema.     Vital Signs:  Patient Profile:   77 Years Old Female Weight:      170.8  pounds O2 Sat:      97 % O2 treatment:    Room Air Temp:     98.0 degrees F oral Pulse rate:   65 / minute BP sitting:   128 / 70  (left arm) Cuff size:   regular  Vitals Entered By: Marijo File CMA (April 11, 2008 10:25 AM)                 Physical Exam  WD, WN, 77 y/o WF in NAD... GENERAL:  Alert & oriented; pleasant & cooperative... HEENT:  [place]/AT, EOM-wnl, PERRLA, EACs-clear, TMs-wnl, NOSE- sl red, THROAT- no exud seen... NECK:  Supple w/ decr ROM; no JVD; normal carotid impulses w/o bruits; no thyromegaly or nodules palpated; no lymphadenopathy. CHEST:  Clear to P & A; without wheezes/ rales/ or rhonchi. HEART:  Regular  Rhythm; msc gr 1/6 SEM without rubs or gallops heard... ABDOMEN:  Soft & nontender; normal bowel sounds; no organomegaly or masses detected. EXT: without deformities, mod arthritic changes; no varicose veins/ +venous insuffic/ no edema. NEURO:  CN's intact;  no focal neuro deficit... DERM:  No lesions noted; no rash etc...        Impression & Recommendations:  Problem # 1:  INTRACRANIAL ANEURYSM (ICD-437.3) F/u scan is unchanged... refer to IR- DrDeveshwar for his review and recommendations for f/u... Orders: Radiology Referral (Radiology)   Problem # 2:  HYPERTENSION (ICD-401.9) Controlled-  same med. Her updated medication list for this problem includes:    Lopressor 50 Mg Tabs (Metoprolol tartrate) .Marland Kitchen... Take 1/2 tablet by mouth every morning and take 1/2 tablet by mouth every evening   Problem # 3:  Hx of TACHYARRHYTHMIA (ICD-785.0) No recent symptoms-  continue Metoprolol...  Problem # 4:  HYPERLIPIDEMIA (ICD-272.4) Stable-  continue Lipitor... Her updated medication list for this problem includes:    Lipitor 20 Mg Tabs (Atorvastatin calcium) .Marland Kitchen... Take 1/2 tablet by mouth once daily   Problem # 5:  COLONIC POLYPS (ICD-211.3) GI is stable-  continue follow up.  Problem # 6:  DEGENERATIVE JOINT DISEASE (ICD-715.90) She wants  refill of the Vicodin for her pain... she uses 1/2 tab Prn "it helps" she says... Her updated medication list for this problem includes:    Adult Aspirin Low Strength 81 Mg Tbdp (Aspirin) .Marland Kitchen... Take 1 tablet by mouth once a day    Hydrocodone-acetaminophen 5-500 Mg Tabs (Hydrocodone-acetaminophen) .Marland Kitchen... Take 1/2 to one tablet by mouth every 6 hours as needed for pain...   Complete Medication List: 1)  Nasonex 50 Mcg/act Susp (Mometasone furoate) .... 2 sp in each nostril at bedtime.Marland KitchenMarland Kitchen 2)  Adult Aspirin Low Strength 81 Mg Tbdp (Aspirin) .... Take 1 tablet by mouth once a day 3)  Lopressor 50 Mg Tabs (Metoprolol tartrate) .... Take 1/2 tablet by mouth every morning and take 1/2 tablet by mouth every evening 4)  Lipitor 20 Mg Tabs (Atorvastatin calcium) .... Take 1/2 tablet by mouth once daily 5)  Aciphex 20 Mg Tbec (Rabeprazole sodium) .... Take one tablet by mouth once daily as needed 6)  Vivelle-dot 0.1 Mg/24hr Pttw (Estradiol) .... Use one patch two times a week 7)  Centrum Silver Tabs (Multiple vitamins-minerals) .... Take 1 tablet by mouth once a day 8)  Vitamin D3 1000 Unit Tabs (Cholecalciferol) .... Take 2 tablets by mouth once daily 9)  Ambien 10 Mg Tabs (Zolpidem tartrate) .Marland Kitchen.. 1 by mouth at bedtime as needed for sleep 10)  Antivert 25 Mg Tabs (Meclizine hcl) .... 1/2 to 1 tab by moth four times a day as needed for dizziness. 11)  Hydrocodone-acetaminophen 5-500 Mg Tabs (Hydrocodone-acetaminophen) .... Take 1/2 to one tablet by mouth every 6 hours as needed for pain...   Patient Instructions: 1)  Today we updated your med list- see below.... 2)  We refilled your Vicodin and Ambien per request... 3)  We will arrange for an appt w/ DrDeveshwar- the Interventional Radiologist- to follow up on your aneurysm.Marland KitchenMarland Kitchen 4)  Call for any problems.Marland KitchenMarland Kitchen 5)  Please schedule a follow-up appointment in 6 months, sooner as needed...   Prescriptions: AMBIEN 10 MG  TABS (ZOLPIDEM TARTRATE) 1 by mouth at  bedtime as needed for sleep  #30 x 5   Entered and Authorized by:   Michele Mcalpine MD   Signed by:   Michele Mcalpine MD on 04/11/2008  Method used:   Print then Give to Patient   RxID:   [id number] HYDROCODONE-ACETAMINOPHEN 5-500 MG TABS (HYDROCODONE-ACETAMINOPHEN) take 1/2 to one tablet by mouth every 6 hours as needed for pain...  #50 x 5   Entered and Authorized by:   Michele Mcalpine MD   Signed by:   Michele Mcalpine MD on 04/11/2008   Method used:   Print then Give to Patient   RxID:   [id number]  ]

## 2010-06-11 NOTE — Progress Notes (Signed)
Summary: req dr Jonai Weyland/ dr stone calling  Phone Note From Other Clinic   Caller: dr Fayrene Fearing stone Call For: Samantha Bishop Summary of Call: requesting to speak to dr Timya Trimmer re: pt.( at his convenience.) call [phone]-[facility]. have dr stone paged.  Initial call taken by: Tivis Ringer,  September 04, 2007 1:08 PM  Follow-up for Phone Call        I called DrStone:  he has reviewed my EMR notes and the MRI sent to [facility]... they feel that the aneuryms is in the supraclinoid region and not the intracavernous area, and measures 5.45mm... they rec a CT Angio now and if everything confirmed- then repeat in 81mo and yearly thereafter for any changes... they quoted a 1.5% per year rupture risk at this size and the mobidity from stent vs coiling may outweigh this rupture risk in an 77y/o woman, esp if it's not enlarging... they have talked to her daughter... I called the pt and she wants to get the CT Angio locally... then we will send copies up to [facility]...  SN         Appended Document: req dr Lula Michaux/ dr stone calling CTA Head with/without contrast scheduled at [facility] for 09/08/07 at 9:00. Pt aware. Pre-cert with [facility] done. NPO 2 hours before.

## 2010-06-11 NOTE — Progress Notes (Signed)
Summary: QUESTION ABOUT MEDICATION  Phone Note From Pharmacy   Caller: [facility]/ [phone] REF# [id number] Summary of Call: QUESTION ABOUT PT REFILL DILTIAZEM Initial call taken by: Judie Grieve,  May 06, 2010 2:00 PM  Follow-up for Phone Call        clarified Diltiazem order w/[facility]. 120 mg xr 24h.  Follow-up by: Claris Gladden RN,  May 06, 2010 2:33 PM     Appended Document: QUESTION ABOUT MEDICATION    Clinical Lists Changes  Medications: Changed medication from DILTIAZEM HCL CR 120 MG XR12H-CAP (DILTIAZEM HCL) Take one capsule by mouth daily. to DILTIAZEM HCL ER BEADS 120 MG XR24H-CAP (DILTIAZEM HCL ER BEADS) once daily

## 2010-06-11 NOTE — Assessment & Plan Note (Signed)
Summary: 1 week/apc   Primary Provider/Referring Provider:  Alroy Dust, MD  CC:  1 week follow up - states still having pain in the ankle but now with shooting pains in the outer calf area, some swelling and reports that when she places said ankle in warm water it feels like "acid was poured on it, and it sets it on fire." - has about 3days left of the doxy.Marland Kitchen  History of Present Illness: 43 with known hx of HTN, tachyarrythmias, DVT, GERD, Hyperlipidemia   April 01, 2009--Presents with  5 days hx. of right outter ankle swelling and redness.  Pt states the swelling and redness of ther R. ankle started  5 days ago, over last 3 days worse-  she couldn't bear weigh on it without pain.  She went to the ED on 2 days ago,   R.ankle x-ray showed soft tissue swelling  and calcification off the lateral malleolus compatible with old trauma.  Her RLE doppler showed neg. for DVT.  She got d/c on Percocet for pain but the pain is still intense with a light touch.   --tx w/ doxycycline x 10 days  April 08, 2009--Present for 1 wk f/u. Last visit dx w/ right lower ext cellulits. Prev w/u in ER w/ neg ven doppler, and neg fx on xray. Labs showed nml cbc, neg wound cx , and nml uric acid level. Tx w/ 10d of Doxcycline (day 8/10) Since last visit, swelling and redness to R. ankle are decreased.  Pt still c/o intermittent pain to her R. ankle but has improved compared with last ov.    Denies chest pain, dyspnea, orthopnea, hemoptysis, fever, n/v/d, edema, headache,R. calf pain, or recent R. leg injury.  Problems Prior to Update: 1)  Ankle Pain  (ICD-719.47) 2)  Atrial Fibrillation  (ICD-427.31) 3)  Dizziness  (ICD-780.4) 4)  Encounter For Long-term Use of Anticoagulants  (ICD-V58.61) 5)  Intracranial Aneurysm  (ICD-437.3) 6)  Hx of Tachyarrhythmia  (ICD-785.0) 7)  Hx of Deep Venous Thrombophlebitis  (ICD-453.40) 8)  Hyperlipidemia  (ICD-272.4) 9)  Gerd  (ICD-530.81) 10)  Diverticulosis of Colon   (ICD-562.10) 11)  Colonic Polyps  (ICD-211.3) 12)  Hx of Renal Calculus  (ICD-592.0) 13)  Degenerative Joint Disease  (ICD-715.90) 14)  Low Back Pain Syndrome  (ICD-724.2) 15)  Neck Pain  (ICD-723.1) 16)  Headache  (ICD-784.0)  Current Problems (verified): 1)  Ankle Pain  (ICD-719.47) 2)  Atrial Fibrillation  (ICD-427.31) 3)  Dizziness  (ICD-780.4) 4)  Encounter For Long-term Use of Anticoagulants  (ICD-V58.61) 5)  Intracranial Aneurysm  (ICD-437.3) 6)  Hx of Tachyarrhythmia  (ICD-785.0) 7)  Hx of Deep Venous Thrombophlebitis  (ICD-453.40) 8)  Hyperlipidemia  (ICD-272.4) 9)  Gerd  (ICD-530.81) 10)  Diverticulosis of Colon  (ICD-562.10) 11)  Colonic Polyps  (ICD-211.3) 12)  Hx of Renal Calculus  (ICD-592.0) 13)  Degenerative Joint Disease  (ICD-715.90) 14)  Low Back Pain Syndrome  (ICD-724.2) 15)  Neck Pain  (ICD-723.1) 16)  Headache  (ICD-784.0)  Medications Prior to Update: 1)  Adult Aspirin Low Strength 81 Mg  Tbdp (Aspirin) .... Take 1 Tablet By Mouth Once A Day 2)  Lopressor 50 Mg Tabs (Metoprolol Tartrate) .... Take 1/2 Tablet By Mouth Every Morning and Take 1/2 Tablet By Mouth Every Evening 3)  Lipitor 20 Mg Tabs (Atorvastatin Calcium) .... Take 1/2 Tablet By Mouth Once Daily 4)  Vitamin D3 1000 Unit  Tabs (Cholecalciferol) .... Take 2 Tablets By Mouth Once Daily 5)  Ambien 10 Mg  Tabs (Zolpidem Tartrate) .Marland Kitchen.. 1 By Mouth At Bedtime As Needed For Sleep 6)  Fish Oil 1000 Mg Caps (Omega-3 Fatty Acids) .... 2 Caps Once Daily 7)  Pradaxa 150 Mg Tabs .... Take Two Times A Day 8)  Colace 100 Mg Caps (Docusate Sodium) .... Take 1 Capsule By Mouth Three Times A Day X7days 9)  Oxycodone-Acetaminophen 5-325 Mg Tabs (Oxycodone-Acetaminophen) .Marland Kitchen.. 1 Every 6 Hours As Needed Pain 10)  Ibuprofen 400 Mg Tabs (Ibuprofen) .... Take 1 Tablet By Mouth Three Times A Day X7days 11)  Famotidine 20 Mg Tabs (Famotidine) .... Take 1 Tablet By Mouth Two Times A Day X7days 12)  Doxycycline Hyclate 100  Mg Caps (Doxycycline Hyclate) .Marland Kitchen.. 1 By Mouth Two Times A Day  Current Medications (verified): 1)  Adult Aspirin Low Strength 81 Mg  Tbdp (Aspirin) .... Take 1 Tablet By Mouth Once A Day 2)  Lopressor 50 Mg Tabs (Metoprolol Tartrate) .... Take 1/2 Tablet By Mouth Every Morning and Take 1/2 Tablet By Mouth Every Evening 3)  Lipitor 20 Mg Tabs (Atorvastatin Calcium) .... Take 1/2 Tablet By Mouth Once Daily 4)  Vitamin D3 1000 Unit  Tabs (Cholecalciferol) .... Take 2 Tablets By Mouth Once Daily 5)  Ambien 10 Mg  Tabs (Zolpidem Tartrate) .Marland Kitchen.. 1 By Mouth At Bedtime As Needed For Sleep 6)  Fish Oil 1000 Mg Caps (Omega-3 Fatty Acids) .... 2 Caps Once Daily 7)  Pradaxa 150 Mg Tabs .... Take Two Times A Day 8)  Colace 100 Mg Caps (Docusate Sodium) .... Take 1 Capsule By Mouth Three Times A Day X7days 9)  Oxycodone-Acetaminophen 5-325 Mg Tabs (Oxycodone-Acetaminophen) .Marland Kitchen.. 1 Every 6 Hours As Needed Pain 10)  Ibuprofen 400 Mg Tabs (Ibuprofen) .... Take 1 Tablet By Mouth Three Times A Day X7days 11)  Famotidine 20 Mg Tabs (Famotidine) .... Take 1 Tablet By Mouth Two Times A Day X7days 12)  Doxycycline Hyclate 100 Mg Caps (Doxycycline Hyclate) .Marland Kitchen.. 1 By Mouth Two Times A Day  Allergies (verified): 1)  ! Penicillin 2)  ! Codeine  Past History:  Past Medical History: Last updated: 04/01/2009 SINUSITIS, ACUTE (ICD-461.9) - she uses Nasal Saline Mist & NASONEX as needed... she saw DrRosen for ENT w/ deviated septum found, otherw neg, but it's hard for her to breathe thru her nose...  HYPERTENSION (ICD-401.9) - controlled on LOPRESSOR 50mg - 1/2 tab Bid.   ~  2DEcho 1996 showed mild LVH, norm valves, EF=60%...  ~  NuclearStressTest 4/04 was neg- no infarct or ischemia, EF=69%... similiar to 1999 study.  Hx of TACHYARRHYTHMIA (ICD-785.0) - hx atrial tachycardia and AV nodal reentry... s/p ablation, no recurrent symptoms... last saw [facility] 4/08 and she will need f/u with one of his partners now that  he has retired.  Hx of DEEP VENOUS THROMBOPHLEBITIS (ICD-453.40) - she had a DVT and a small PE after knee surgery in 1997... subseq eval of her veins by the vasc surgeons showed venous stasis disease...  HYPERLIPIDEMIA (ICD-272.4) - on LIPITOR 20mg - 1/2 tab daily... tolerates Rx well...  ~  FLP 11/08 in the [facility] showing TChol 148, TG 84, HDL 53, LDL 78... LFT's were WNL.Marland Kitchen.  ~  FLP 10/09 showed TChol 162, TG 85, HDL 63, LDL 83...  GERD (ICD-530.81) - last EGD was 3/06 showing chr gastritis & duodenitis... she takes ACIPHEX 20mg /d...  DIVERTICULOSIS OF COLON (ICD-562.10) & COLONIC POLYPS (ICD-211.3) - last colonoscopy was 1/07 by DrSam showing divertics only... f/u planned 31yrs... (last  polyp was 1999 tiny & cauterized).  Hx of RENAL CALCULUS (ICD-592.0) - stones seen in kidney on CTAbd 2007, non-obstructing & no hx of symptomatic nephrolithiasis...  DEGENERATIVE JOINT DISEASE (ICD-715.90) - prev hx of arthroscopy w/ DVT and PTE in 1995... she had a right TKR by DrWainer 4/04...   LOW BACK PAIN SYNDROME (ICD-724.2) - she has had LBP evauated by Ortho- DrWainer, DrGioffre; and by Neurosurg- DrJenkins in 2003... MRI showed a disc at L3-4 w/ mild spinal stenosis, but he did not rec surg...    GYN = DrLomax & last seen 12/08... on VIVELLE & Ca++/ VitD... he did BMD i  Family History: Last updated: 04/09/09 Mother-deceased, MI age 27 Father-deceased, MI age 44 Brother deceased alcohol cirrhosis  Social History: Last updated: 03/03/2009 No alcohol, tobacco drugs Divorced 3 children, 4 grandchildren, 2 great grandchildren Worked in cigarette factory  Risk Factors: Smoking Status: never (04/11/2008)  Past Surgical History: PMH cont  Atypical Headaches--- Presented 3/09 w/ headache that sounded like cervicogenic HA, and CSpine films showed degen spondylosis & facet hypertrophy- Rx'd w/ MOBIC 7.5mg /d & SKELAXIN 800mg Tid, heat, etc...  she also c/o "sinus" symptoms Rx'd w/  Avelox, Nasonex, saline etc (symptoms improved)... 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  She was referred to [facility] for Neurology who concurred, and rec physical therapy for her neck and ZANAFLEX... this helped some and her pain is improved... she notes that she cannot tolerate the Percocet (nausea) and that DCN does not work for her... she is asking about Oxycontin which she apparently had yrs ago after knee surgery, but I have denied this... we will call in some MIDRIN for her to try for the headaches as needed...   In the meanwhile we contacted her relatives at [facility] and I sent her scan and notes to Dr. Annamary Rummage at the [facility] [phone] [address] [place] [address]... their review indicated that they felt her aneurysm was coming off the supraclinoid segment of the left ICA and they requested that we perform a CT Angio for further images... this was done 09/08/07 and showed a 5mm aneurysm projecting posteriorly from the junction of the cavernous and supraclinoid segments of the left ICA... we sent a disc of the study to DrStone as he wanted to measure the size of the aneurysms mouth (helpful in determining whether to plan a coil or stent later)... he asked [place] to repeat the CT Angio is 6 months...  SH--------------------------------------------------------------------------  S/P T & A S/P nasal surgery in past S/P appendectomy  S/P hysterectomy S/P breast cyst removal S/P hemorroid surgery x 2 S/P ELap for SBO w/ lysis of adhesions 1984 S/P left knee surg in 1997 S/P right TKR in 2004 by Dr Thurston Hole  Review of Systems       The patient complains of peripheral edema.  The patient denies fever, vision loss, hoarseness, chest pain, syncope, dyspnea on exertion, prolonged cough, headaches, hemoptysis, abdominal pain, melena, hematochezia,  severe indigestion/heartburn, hematuria, unusual weight change, and abnormal bleeding.    Vital Signs:  Patient profile:   77 year old female Height:      66 inches Weight:      175 pounds BMI:     28.35 O2 Sat:      99 % on Room air Temp:     97.8 degrees F oral Pulse rate:  78 / minute BP sitting:   134 / 74  (left arm) Cuff size:   regular  Vitals Entered By: Boone Master CNA (April 08, 2009 10:02 AM)  O2 Flow:  Room air CC: 1 week follow up - states still having pain in the ankle but now with shooting pains in the outer calf area, some swelling and reports that when she places said ankle in warm water it feels like "acid was poured on it, it sets it on fire." - has about 3days left of the doxy. Is Patient Diabetic? No Comments Medications reviewed with patient Daytime contact number verified with patient. Boone Master CNA  April 08, 2009 10:06 AM    Physical Exam  Additional Exam:  GEN: A/Ox3; pleasant , NAD HEENT:  [place]/AT, , EACs-clear, TMs-wnl, NOSE-clear, THROAT-clear NECK:  Supple w/ fair ROM; no JVD; normal carotid impulses w/o bruits; no thyromegaly or nodules palpated; no lymphadenopathy. RESP  Clear to P & A; w/o, wheezes/ rales/ or rhonchi. CARD:  RRR, no m/r/g   GI:   Soft & nt; nml bowel sounds; no organomegaly or masses detected. Musco: Warm bil,  no clubbing, pulses intact.  R. shin and ankle +1edema and sl. red. No calf tenderness bilaterally decreased swelling/redness since last visit. Marland Kitchen Neuro: Steady gait.   Impression & Recommendations:  Problem # 1:  CELLULITIS, ANKLE (ICD-682.6)  Resolving. w/u w/ neg ven doppler, xray, nml uric acid level.  Appears to be improving. reviewed labs w/ pt.  REC:  Finish Doxycycline   Keep Foot elevated.  Warm compresses to ankle  Please contact office for sooner follow up if symptoms do not improve or worsen  follow up Dr. Kriste Basque in 3 months   Her updated medication list for this problem includes:     Doxycycline Hyclate 100 Mg Caps (Doxycycline hyclate) .Marland Kitchen... 1 by mouth two times a day  Orders: Est. Patient Level II ([address])  Complete Medication List: 1)  Adult Aspirin Low Strength 81 Mg Tbdp (Aspirin) .... Take 1 tablet by mouth once a day 2)  Lopressor 50 Mg Tabs (Metoprolol tartrate) .... Take 1/2 tablet by mouth every morning and take 1/2 tablet by mouth every evening 3)  Lipitor 20 Mg Tabs (Atorvastatin calcium) .... Take 1/2 tablet by mouth once daily 4)  Vitamin D3 1000 Unit Tabs (Cholecalciferol) .... Take 2 tablets by mouth once daily 5)  Ambien 10 Mg Tabs (Zolpidem tartrate) .Marland Kitchen.. 1 by mouth at bedtime as needed for sleep 6)  Fish Oil 1000 Mg Caps (Omega-3 fatty acids) .... 2 caps once daily 7)  Pradaxa 150 Mg Tabs  .... Take two times a day 8)  Colace 100 Mg Caps (Docusate sodium) .... Take 1 capsule by mouth three times a day x7days 9)  Oxycodone-acetaminophen 5-325 Mg Tabs (Oxycodone-acetaminophen) .Marland Kitchen.. 1 every 6 hours as needed pain 10)  Ibuprofen 400 Mg Tabs (Ibuprofen) .... Take 1 tablet by mouth three times a day x7days 11)  Famotidine 20 Mg Tabs (Famotidine) .... Take 1 tablet by mouth two times a day x7days 12)  Doxycycline Hyclate 100 Mg Caps (Doxycycline hyclate) .Marland Kitchen.. 1 by mouth two times a day  Patient Instructions: 1)  Finish Doxycycline  2)  Stool softner daily 3)  Miralax daily for constipation 4)  Increase fluids-water.  5)  Activa yogurt daily.  6)   Keep Foot elevated.  7)  Warm compresses to ankle  8)  follow up Dr. Kriste Basque in 3 months  9)  Please contact  office for sooner follow up if symptoms do not improve or worsen  Prescriptions: OXYCODONE-ACETAMINOPHEN 5-325 MG TABS (OXYCODONE-ACETAMINOPHEN) 1 every 6 hours as needed pain  #10 x 0   Entered and Authorized by:   Rubye Oaks NP   Signed by:   Rubye Oaks NP on 04/08/2009   Method used:   Print then Give to Patient   RxID:   [id number]

## 2010-06-11 NOTE — Progress Notes (Signed)
Summary: prescript  Phone Note Call from Patient   Caller: Patient Call For: nadel Summary of Call: need prescript for lopressor 50mg  was sent 1 month supply but need 3 month pharmacy [facility] [phone] Initial call taken by: Rickard Patience,  November 14, 2007 8:40 AM  Follow-up for Phone Call        called and spoke with pt. pt stated Dr. Kriste Basque gave her a 30 day rx and pt is requesting this be changed to a 90 day rx with 3 refills. informed pt i would send rx to pharmacy. Cyndia Diver LPN  November 14, 7827 9:28 AM       Prescriptions: LOPRESSOR 50 MG TABS (METOPROLOL TARTRATE) take 1/2 tablet by mouth every morning and take 1/2 tablet by mouth every evening  #90 x 4   Entered by:   Cyndia Diver LPN   Authorized by:   Michele Mcalpine MD   Signed by:   Cyndia Diver LPN on 56/21/3086   Method used:   Electronically sent to ...       [facility] [address] [phone]       [address]       [place], [place]  [address]       Ph: [phone] or [phone]       Fax: [phone]   RxID:   [id number]

## 2010-06-11 NOTE — Assessment & Plan Note (Signed)
Summary: 6 months/apc   Primary Care Provider:  Alroy Dust, MD  CC:  6 month ROV & review of mult medical problems....  History of Present Illness: 77 y/o WF here for a follow up visit... please see my prev EMR notes for details:    Presented 3/09 w/ headache that sounded like cervicogenic HA, and CSpine films showed degen spondylosis & facet hypertrophy- Rx'd w/ MOBIC 7.5mg /d & SKELAXIN 800mg Tid, heat, etc... referred to [facility] who concurred, and rec physical therapy for her neck and ZANAFLEX... this helped some and her pain is improved... she had an MRI/ MRA done which showed atrophy & sm vessel dis, plus an incidental 5mm aneurysm projecting posteriorly from the left ICA (cavernous segment)...  we contacted her relatives at [facility] and sent her scan and notes to Dr. Annamary Rummage at the Dept of Radiology [phone] [id number] [place] [address]... their review indicated that they felt her aneurysm was coming off the supraclinoid segment of the left ICA and they requested that we perform a CT Angio for further images... this was done 09/08/07 and showed a 5mm aneurysm projecting posteriorly from the junction of the cavernous and supraclinoid segments of the left ICA...  a f/u CTAngio 04/08/08 showed no significant change and this is reassuring... she tells me that if she needs surg she wants to go to [facility] to see [facility] doctor...     **she saw DrDeveshwar 12/09 & he rec cerebral angiogram & consideration of endovasc coiling- she refused further eval feeling that he just wanted to "operate right away".   ~  May 21, 2009:  she developed a cellulitis in right lower extremity- treated as outpt w/ Doxy but no better- went to see her Ortho, DrWainer who sent her to hosp for IV antibiotics & adm by [facility] (she has hx DVT/ PE after knee surg 1997) ... VenDopplers were neg- but tender on palp & ?superfic thrombophlebitis entertained- Rx's w/ BacterimDS, along w/  Naprosyn & Percocet for pain...   Hx cardiac arrhythmia in past- AVnodal reentrant tachycard s/p ablation 2003- w/ palpit eval 10/10 DrMcAlhaney>> EKG= NSR rate 66/min, WNL;  2DEcho showed norm LV w/ EF=55-65%, mild AoV thickening, triv MR, LA=1mm;  Holter Monitor showed PAF;  & CDopplers showed calcif plaque in ICA's 0-39% stenoses... DrKlein started [employer] for CHADS score of 4 & she takes an extra 1/2 Lopressor Prn symptoms...   ~  August 19, 2009:  DrWainer sent her to ID- DrFitzgerald 1/11 for further treatment of her recurrent RLE cellulitis & superfic thrombophleb> he placed her on Keflex & she remains on this med Tid at present (prolonged course over several months)... she was getting dressing changes Qod at DrWainers "til medicare wouldn't pay" & now doing it at home... last note from DrFitzgerald 3/11 reviewed- weaning the Keflex down from Tid to Bid to Qd...   She saw DrKlein 2/11 for f/u AFib on Pradaxa, & he stopped her ASA, also changed Lip20 to Simva20 for $$ reasons- f/u FLP looks good.   She is also due for f/u CT Angio Brain to re-image her aneurysm (last 11/109)- stable, no change.   ~  February 18, 2010:  She had f/u for her DJD & cellulitis by DrWainer & DrFitzgerald 5/11- he has kept her on suppressive Keflex 500mg /d, & compression hose (he suggested stopping the Keflex after 19mo to see if she had any recurrence)...  she has onychomycosis & DrTuckman wanted to use Lamisil, &  when DrKlein saw her for Cards f/u 8/11 (AFib & palpit)- he changed Lopressor to CardizemCD 120mg /d... she now requests a perscription for Fluocinolone acetonide topical solution 0.01% stating this worked well for her daughter...    Her CC today is chronic daily HAs for which she had an eval in [place] including shots in her neck etc... she was then [place] to [place]Rauch [place] further treatment but he needed OK from DrKlein to hold the Pradaxa before any further interventions> OK to hold Pradaxa for 5d  prior & she is waiting for f/u appt w/ DrRauch...   Current Problem List:  SINUSITIS, ACUTE (ICD-461.9) - she uses Nasal Saline Mist & NASONEX as needed... she saw DrRosen for ENT w/ deviated septum found, otherw neg, but it's hard for her to breathe thru her nose...  HYPERTENSION (ICD-401.9) - controlled on DILTIAZEMCD 120mg /d... BP today is 124/82...  ~  2DEcho 1996 showed mild LVH, norm valves, EF=60%...  ~  NuclearStressTest 4/04 was neg- no infarct or ischemia, EF=69%... similiar to 1999 study.  ~  2DEcho 10/10 showed norm LV w/ EF=55-65%, mild AoV thickening, triv MR, LA=73mm...  Hx of TACHYARRHYTHMIA (ICD-785.0) - hx atrial tachycardia and AV nodal reentry in 2003... s/p ablation, no recurrent symptoms... last saw [facility] 4/08... referred to [facility] 10/10 w/ incr palpit & cards eval including>> EKG= NSR rate 66/min, WNL;  2DEcho showed norm LV w/ EF=55-65%, mild AoV thickening, triv MR, LA=64mm;  Holter Monitor showed PAF;  & CDopplers showed calcif plaque in ICA's 0-39% stenoses... DrKlein started PRADAXA 150mg Bid.  Hx of DEEP VENOUS THROMBOPHLEBITIS (ICD-453.40) & Hx of CELLULITIS, ANKLE (ICD-682.6) - she had a DVT and a small PE after knee surgery in 1997... subseq eval of her veins by the vasc surgeons showed venous stasis disease...  developed cellulitis & ?of superficial thrombophlebitis 12/10- hosp transiently & VenDopplers neg for DVT, treated w/ BacterimDS, Naprosyn, Percocet & initially improved... DrWainer sent her to DrFitzgerald 1/11 for recurrent cellulitis RLE & he started prolonged course of Keflex & local wraps...  ~  10/11:  she remains on suppressive therapy w/ Keflex 500mg /d from DrFitzgerald.  HYPERLIPIDEMIA (ICD-272.4) - prev on Lipitor10 + FishOil 2/d... tol Rx well but too$$ & DrKlein 2/11 ch to SIMVASTATIN 20mg /d...  ~  FLP 11/08 in the Lipid Clinic showing TChol 148, TG 84, HDL 53, LDL 78... LFT's were WNL.Marland Kitchen.  ~  FLP 10/09 showed TChol 162, TG 85, HDL 63,  LDL 83  ~  Lipid Clinic f/u 11/10 on Lip10 + FishOil 2/d>> TChol 143, TG 72, HDL 59, LDL 70  ~  FLP 4/11 on Simva20 showed TChol 130, TG 54, HDL 64, LDL 55  GERD (ICD-530.81) - last EGD was 3/06 showing chr gastritis & duodenitis... prev on Aciphex20, now Prn Pepcid vs Prilosec...  DIVERTICULOSIS OF COLON (ICD-562.10) & COLONIC POLYPS (ICD-211.3) - last colonoscopy was 1/07 by DrSam showing divertics only... f/u planned 55yrs... (last polyp was 1999 tiny & cauterized).  ~  1/11:  c/o bowel irregularity & some blood seen after hosp for cellulitis- Rx w/ Align/ Activia/ Proctocort cream.  Hx of RENAL CALCULUS (ICD-592.0) - stones seen in kidney on CTAbd 2007, non-obstructing & no hx of symptomatic nephrolithiasis...  DEGENERATIVE JOINT DISEASE (ICD-715.90) - prev hx of arthroscopy w/ DVT and PTE in 1995... she had a right TKR by DrWainer 4/04...  **apparently she had arrhythmia post op "heart stopped for 18sec & DrWainer said I can't be put to sleep no more"  LOW BACK PAIN SYNDROME (ICD-724.2) - she has had LBP evauated by Ortho- DrWainer, DrGioffre; and by Neurosurg- DrJenkins in 2003... MRI showed a disc at L3-4 w/ mild spinal stenosis, but he did not rec surg...   OSTEOPENIA (ICD-733.90) - BMD followed by DrLomax, Gyn> -1.6 in [facility]... on Vivelle, Calcium, VitD.  NECK PAIN & HEADACHE - Dx w/ occip neuralgia & cervical degen disc dis w/ prev eval here in 2009 & another eval at [facility] 7/10- treated w/ bilat occip nerve blocks 9/10 & improved... refered to [facility] to continue this therapy closer to home...   INTRACRANIAL ANEURYSM (ICD-437.3) *** SEE ABOVE ***  ANXIETY (ICD-300.00) & INSOMNIA, CHRONIC (ICD-307.42) - she notes that Ambien 10mg  w/o help and wants something stronger- try RESTORIL 30mg  Qhs Prn...  GYN = DrLomax & seen 5/11... on VIVELLE & Ca++/ VitD... he did BMD in 10/07 w/ osteopenia, & f/u 5/11 showed min deterioration w/ TScores -0.8 in spine, and -1.6 in  [facility]...   Preventive Screening-Counseling & Management  Alcohol-Tobacco     Alcohol drinks/day: 0     Smoking Status: never  Caffeine-Diet-Exercise     Caffeine use/day: 0     Does Patient Exercise: no  Allergies: 1)  ! Penicillin 2)  ! Codeine  Comments:  Nurse/Medical Assistant: The patient's medications and allergies were reviewed with the patient and were updated in the Medication and Allergy Lists.  Past History:  Past Medical History: HYPERTENSION, BORDERLINE (ICD-401.9) ATRIAL FIBRILLATION (ICD-427.31) Hx of TACHYARRHYTHMIA (ICD-785.0) Hx of DEEP VENOUS THROMBOPHLEBITIS (ICD-453.40) HYPERLIPIDEMIA (ICD-272.4) GERD (ICD-530.81) DIVERTICULOSIS OF COLON (ICD-562.10) COLONIC POLYPS (ICD-211.3) HEMORRHOIDS (ICD-455.6) Hx of RENAL CALCULUS (ICD-592.0) DEGENERATIVE JOINT DISEASE (ICD-715.90) NECK PAIN (ICD-723.1) LOW BACK PAIN SYNDROME (ICD-724.2) OSTEOPENIA (ICD-733.90) Hx of CELLULITIS, ANKLE (ICD-682.6) HEADACHE (ICD-784.0) INTRACRANIAL ANEURYSM (ICD-437.3) ANXIETY (ICD-300.00) INSOMNIA, CHRONIC (ICD-307.42)  Past Surgical History: S/P T & A S/P nasal surgery in past S/P appendectomy  S/P hysterectomy S/P breast cyst removal S/P hemorroid surgery x 2 S/P ELap for SBO w/ lysis of adhesions 1984 S/P left knee surg in 1997 S/P right TKR in 2004 by Dr Thurston Hole  Family History: Reviewed history from 03/28/2009 and no changes required. Mother-deceased, MI age 12 Father-deceased, MI age 52 Brother deceased alcohol cirrhosis  Social History: Reviewed history from 03/03/2009 and no changes required. No alcohol, tobacco drugs Divorced 3 children, 4 grandchildren, 2 great grandchildren Worked in cigarette factory  Review of Systems      See HPI       The patient complains of dyspnea on exertion and peripheral edema.  The patient denies anorexia, fever, weight loss, weight gain, vision loss, decreased hearing, hoarseness, chest pain, syncope, prolonged  cough, headaches, hemoptysis, abdominal pain, melena, hematochezia, severe indigestion/heartburn, hematuria, incontinence, muscle weakness, suspicious skin lesions, transient blindness, difficulty walking, depression, unusual weight change, abnormal bleeding, enlarged lymph nodes, and angioedema.    Vital Signs:  Patient profile:   77 year old female Height:      68 inches Weight:      174.25 pounds O2 Sat:      98 % on Room air Temp:     97.3 degrees F oral Pulse rate:   74 / minute BP sitting:   124 / 82  (right arm) Cuff size:   regular  Vitals Entered By: Randell Loop CMA (February 18, 2010 9:59 AM)  O2 Sat at Rest %:  98 O2 Flow:  Room air CC: 6 month ROV & review of mult medical problems... Is Patient Diabetic?  No Pain Assessment Patient in pain? no      Comments meds updated today with pt   Physical Exam  Additional Exam:  WD, WN, 77 y/o WF in NAD... GENERAL:  Alert & oriented; pleasant & cooperative... HEENT:  [place]/AT, EOM-wnl, PERRLA, EACs-clear, TMs-wnl, NOSE- sl red, THROAT- no exud seen... NECK:  Supple w/ decrROM; no JVD; normal carotid impulses w/o bruits; no thyromegaly or nodules palpated; no lymphadenopathy. CHEST:  Clear to P & A; without wheezes/ rales/ or rhonchi. HEART:  Regular Rhythm; msc gr 1/6 SEM without rubs or gallops heard... ABDOMEN:  Soft & nontender; normal bowel sounds; no organomegaly or masses detected. EXT: without deformities, mod arthritic changes; no varicose veins/ +venous insuffic/ tr edema. Right lower leg wrapped, support hose, less discomfort... NEURO:  CN's intact;  no focal neuro deficit... DERM:  No lesions noted; no rash etc... just chr ven insuffic changes in LE's...    Impression & Recommendations:  Problem # 1:  HYPERTENSION, BORDERLINE (ICD-401.9) Controlled on the DiltiazemCD...  Problem # 2:  ATRIAL FIBRILLATION (ICD-427.31) Followed by DrKlein> on Diltiazem & Pradaxa...  Problem # 3:  Hx of DEEP VENOUS  THROMBOPHLEBITIS (ICD-453.40) She has venous dis & chr cellulitis treated by DrWainer Sampson Goon on suppressive therapy w/ Keflex 500mg /d... DrFitz rec that she wean off the Keflex after 6 months & we discussed this...  Problem # 4:  HYPERLIPIDEMIA (ICD-272.4) Stable on the Simva20... Her updated medication list for this problem includes:    Simvastatin 20 Mg Tabs (Simvastatin) .Marland Kitchen... Take one tablet by mouth daily at bedtime  Problem # 5:  GI>>> She has GERD, divertics, polyps> stable...  Problem # 6:  ORTHO>>> Followed by DrWainer w/ prev right TKR in 2004;  hx LBP as well...  Problem # 7:  HEADACHE (ICD-784.0) She has hx of neck pain & HA> refer back to DrRauch per her request... Orders: Headache Clinic Referral (Headache)  Problem # 8:  OTHER MEDICAL PROBLEMS AS NOTED>>>  Complete Medication List: 1)  Fluticasone Propionate 50 Mcg/act Susp (Fluticasone propionate) .Marland Kitchen.. 1-2 sprays in each nostril once daily 2)  Pradaxa 150 Mg Caps (Dabigatran etexilate mesylate) .... Take 1 tab by mouth two times a day... 3)  Simvastatin 20 Mg Tabs (Simvastatin) .... Take one tablet by mouth daily at bedtime 4)  Fish Oil 1000 Mg Caps (Omega-3 fatty acids) .... 2 caps once daily 5)  Vivelle-dot 0.05 Mg/24hr Pttw (Estradiol) .... Use 2 times weekly 6)  Vitamin D3 1000 Unit Tabs (Cholecalciferol) .... Take 2 tablets by mouth once daily 7)  Temazepam 15 Mg Caps (Temazepam) .... Take one capsule by mouth at bedtime 8)  Cephalexin 500 Mg Tabs (Cephalexin) .... Take one tablet by mouth once daily 9)  Fluocinolone Acetonide 0.01 % Soln (Fluocinolone acetonide) .... Apply as directed to nails...  Patient Instructions: 1)  Today we updated your med list- see below.... 2)  Continue your current meds the same... 3)  We wrote a new perscription for FLUOCINOLONE ACETONIDE 0/01% topical solution to use on your nail problem... 4)  We will arrange for a follow up appt w/ DrRauch pain clinic.Marland KitchenMarland Kitchen 5)  Call for  any questions.Marland KitchenMarland Kitchen 6)  Please schedule a follow-up appointment in 6 months, w/ FASTUING blood work at that time... Prescriptions: FLUOCINOLONE ACETONIDE 0.01 % SOLN (FLUOCINOLONE ACETONIDE) apply as directed to nails...  #1 vial x prn   Entered and Authorized by:   Michele Mcalpine MD   Signed by:   Lonzo Cloud  Kriste Basque MD on 02/18/2010   Method used:   Print then Give to Patient   RxID:   [id number]    Immunization History:  Influenza Immunization History:    Influenza:  historical (01/19/2010)

## 2010-06-11 NOTE — Consult Note (Signed)
Summary: [facility]  [facility]   Imported By: Esmeralda Links D'jimraou 09/06/2007 11:59:20  _____________________________________________________________________  External Attachment:    Type:   Image     Comment:   External Document

## 2010-06-11 NOTE — Miscellaneous (Signed)
Summary: Discontinued medications added to list  Clinical Lists Changes  Medications: Added new medication of BACTRIM DS 800-160 MG TABS (SULFAMETHOXAZOLE-TRIMETHOPRIM)

## 2010-06-11 NOTE — Progress Notes (Signed)
   Phone Note Outgoing Call   Call placed by: Dossie Arbour, RN, BSN,  March 11, 2009 9:32 AM Call placed to: Patient Summary of Call: Event monitor showed A fib as per note by Theodore Demark, PA-C. Called pt who reports feeling palpitations every morning.  Palpitations this AM no worse than usual.  Pt. reports she is feeling well at present time with no complaints.  Will notify Dr. Graciela Husbands Initial call taken by: Dossie Arbour, RN, BSN,  March 11, 2009 9:37 AM  Follow-up for Phone Call        Discussed with Dr. Graciela Husbands. Pt to continue aspirin and keep scheduled appt. with him and return monitor.  Pt notified of these instructions.  Pt. also notified to call office or 911 if she becomes lightheaded or symptomatic. She verbalizes understanding. Follow-up by: Dossie Arbour, RN, BSN,  March 11, 2009 10:08 AM

## 2010-06-11 NOTE — Assessment & Plan Note (Signed)
Summary: rov   Chief Complaint:  To discuss test results....  History of Present Illness: 77 y/o WF here for a follow up visit to discuss test results...  08/07/07 OV: She was last seen in 2007 complaining of sinusitis... she has mult med problems as noted below... she was followed by [facility] for Cardiology and the Lipid Clinic... c/o 1 month hx of headaches... starts in back of head & spreads all over... "I never get headaches"... denies balance prob, visual symptoms, n/v, etc... she thinks it's "sinus" because she is also congested, blowing out "cold" w/ blood streaks, "sandpaper tongue" and throat closing up... she notes nasal congestion & it closes up at night... states 2 operations from DrPatsevorus in past...  We treated her w/ Avelox, Nasonex, Saline for her sinusitis;  and proceeded w/ CSpine films for her neck discomfort, MRI/ MRA due to her concerns about her headaches... she was not fasting, and blood work was not done at that time...  ~  CSpine:  degenerative spondylosis & facet hypertrophy- see report.  ~  MRI Brain:  atrophy and sm vessel dis, NAD...   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 Pt notified of results and my feeling that we should pursue Neurology consultation for her headache which is most likely cervicogenic, and for their opinion about the 5mm aneurysm (?representing an incidental finding that will need follow up)...  08/16/07 OV: She returns today w/ a relative to discuss the above... she has a relative in [place] w/ connections to [place] and stated several times that she wants "the best there is" to evaluate her problem... she gave me the name of Dr. Annamary Rummage who is an interventional radiologist in [place] and I will contact him by phone to discuss her case... I stressed to her, however, that I did not believe that the 5mm aneurysm was responsible for her headache (rather it appears to be the fault of her  cervical arthritis)... in this regard we will start her on MOBIC 7.5mg /d and SKELAXIN 800mg Tid, plus rec the use of a heating pad etc...   Acute Visit History: Current Problems:   SINUSITIS, ACUTE (ICD-461.9) - she is finishing the course of AVELOX, Nasal Saline Mist, & NASONEX... rec to continue the Saline, Nasonex and add MUCINEX OTC 1-2 tabsBid...  HYPERTENSION (ICD-401.9) - controlled on LOPRESSOR 50mg - 1/2 tab Bid... BP today is 126/70...  ~  2DEcho 1996 showed mild LVH, norm valves, EF=60%...  ~  NuclearStressTest 4/04 was neg- no infarct or ischemia, EF=69%... similiar to 1999 study.  Hx of TACHYARRHYTHMIA (ICD-785.0) - hx atrial tachycardia and AV nodal reentry... s/p ablation, no recurrent symptoms... last saw [facility] 4/08 and she will need f/u with one of his partners now that he has retired.  Hx of DEEP VENOUS THROMBOPHLEBITIS (ICD-453.40) - she had a DVT and a small PE after knee surgery in 1997... subseq eval of her veins by the vasc surgeons showed venous stasis disease...  HYPERLIPIDEMIA (ICD-272.4) - on LIPITOR 20mg - 1/2 tab daily... tolerates Rx well...  ~  last FLP was 11/08 in the Lipid Clinic showing TChol 148, TG 84, HDL 53, LDL 78... LFT's were WNL...  GERD (ICD-530.81) - last EGD was 3/06 showing chr gastritis & duodenitis... she takes ACIPHEX 20mg /d... DIVERTICULOSIS OF COLON (ICD-562.10) & COLONIC POLYPS (ICD-211.3) - last colonoscopy was 1/07 by DrSam showing divertics only... f/u planned 88yrs... (last polyp was 1999 tiny & cauterized).  Hx of RENAL  CALCULUS (ICD-592.0) - stones seen in kidney on CTAbd 2007, non-obstructing & no hx of symptomatic nephrolithiasis...  DEGENERATIVE JOINT DISEASE (ICD-715.90) - prev hx of arthroscopy w/ DVT and PTE in 1995... she had a right TKR by DrWainer 4/04...  LOW BACK PAIN SYNDROME (ICD-724.2) - she has had LBP evauated by Ortho- DrWainer, DrGioffre; and by Neurosurg- DrJenkins in 2003... MRI showed a disc at L3-4 w/ mild  spinal stenosis, but he did not rec surg...   NECK PAIN (ICD-723.1)        <<  see HPI and recent CSpine films  >> HEADACHE (ICD-784.0) GYN = DrLomax & last seen 12/08... on VIVELLE & Ca++/ VitD... he did BMD in 10/07 reporting osteopenia...       Current Allergies (reviewed today): ! PENICILLIN ! CODEINE  Past Medical History:        SINUSITIS, ACUTE (ICD-461.9)    HYPERTENSION (ICD-401.9)    Hx of TACHYARRHYTHMIA (ICD-785.0)    Hx of DEEP VENOUS THROMBOPHLEBITIS (ICD-453.40)    HYPERLIPIDEMIA (ICD-272.4)    GERD (ICD-530.81)    DIVERTICULOSIS OF COLON (ICD-562.10)    COLONIC POLYPS (ICD-211.3)    Hx of RENAL CALCULUS (ICD-592.0)    DEGENERATIVE JOINT DISEASE (ICD-715.90)    LOW BACK PAIN SYNDROME (ICD-724.2)    NECK PAIN (ICD-723.1)    HEADACHE (ICD-784.0)      Past Surgical History:    S/P T & A    S/P nasal surgery in past    S/P appendectomy     S/P hysterectomy    S/P breast cyst removal    S/P hemorroid surgery    S/P ELap for SBO w/ lysis of adhesions 1984    S/P left knee surg in 1997    S/P right TKR in 2004 by DrWainer    Risk Factors:  Tobacco use:  never   Review of Systems  The patient denies anorexia, fever, weight loss, weight gain, vision loss, decreased hearing, hoarseness, chest pain, syncope, peripheral edema, prolonged cough, hemoptysis, abdominal pain, melena, hematochezia, severe indigestion/heartburn, hematuria, incontinence, muscle weakness, suspicious skin lesions, transient blindness, difficulty walking, depression, unusual weight change, abnormal bleeding, enlarged lymph nodes, and angioedema.     Vital Signs:  Patient Profile:   77 Years Old Female Weight:      169.25 pounds O2 Sat:      94 % O2 treatment:    Room Air Temp:     97.1 degrees F oral Pulse rate:   62 / minute BP sitting:   126 / 70  (left arm) Cuff size:   regular  Vitals Entered By: Boone Master CNA (August 16, 2007 2:02 PM)             Is Patient  Diabetic? No Comments Medications reviewed with patient ..................................................................Marland KitchenBoone Master CNA  August 16, 2007 2:02 PM      Physical Exam  WD, WN, 77 y/o WF in NAD... GENERAL:  Alert & oriented; pleasant & cooperative... HEENT:  Bowler/AT, EOM-wnl, PERRLA, EACs-clear, TMs-wnl, NOSE- sl red, THROAT- no exud seen... NECK:  Supple w/ decr ROM; no JVD; normal carotid impulses w/o bruits; no thyromegaly or nodules palpated; no lymphadenopathy. CHEST:  Clear to P & A; without wheezes/ rales/ or rhonchi. HEART:  Regular Rhythm; msc gr 1/6 SEM without rubs or gallops heard... ABDOMEN:  Soft & nontender; normal bowel sounds; no organomegaly or masses detected. EXT: without deformities, mod arthritic changes; no varicose veins/ +venous insuffic/ no edema. NEURO:  CN's intact;  no focal neuro deficit... DERM:  No lesions noted; no rash etc...       Impression & Recommendations:  Problem # 1:  HEADACHE (ICD-784.0) HA appears to be cervicogenic... we will add Mobic 7.5mg /d, Skelaxin 800mg Tid as needed, and heating pad to neck area... she already has Percocet from the last OV... proceed w/ eval from Neurology- appt pending, may need CSpine MRI etc... she has seen DrJenkins, Neurosurg in the past...  Her updated medication list for this problem includes:    Adult Aspirin Low Strength 81 Mg Tbdp (Aspirin) .Marland Kitchen... Take 1 tablet by mouth once a day    Lopressor 50 Mg Tabs (Metoprolol tartrate) .Marland Kitchen... Take 1/2 tablet by mouth every morning and take 1/2 tablet by mouth every evening    Oxycodone-acetaminophen 5-325 Mg Tabs (Oxycodone-acetaminophen) .Marland Kitchen... 1 tab by mouth every 6-8 hours as needed for pain...    Mobic 7.5 Mg Tabs (Meloxicam) .Marland Kitchen... Take 1 tab by mouth once daily for arthritis pain...   Problem # 2:  SINUSITIS, ACUTE (ICD-461.9) She is finishing the Avelox, and will continue w/ the Saline, Nasonex, Mucinex therapy...  Her updated medication list  for this problem includes:    Avelox 400 Mg Tabs (Moxifloxacin hcl) .Marland Kitchen... 1 tab by mouth once daily til gone...    Nasonex 50 Mcg/act Susp (Mometasone furoate) .Marland Kitchen... 2 sp in each nostril at bedtime...   Problem # 3:  INTRACRANIAL ANEURYSM (ICD-437.3) MRA Brain showed a 5mm cavernous ICA aneurysm on the left... this has created alot of angst and she will require further evaluation and several opinions from the various specialties both here in [place] and in [place]... we will start w/ Neurology consultation and I will contact DrStone in [place] per request...  Problem # 4:  HYPERTENSION (ICD-401.9) Controlled... continue meds,,,  Her updated medication list for this problem includes:    Lopressor 50 Mg Tabs (Metoprolol tartrate) .Marland Kitchen... Take 1/2 tablet by mouth every morning and take 1/2 tablet by mouth every evening   Complete Medication List: 1)  Adult Aspirin Low Strength 81 Mg Tbdp (Aspirin) .... Take 1 tablet by mouth once a day 2)  Lopressor 50 Mg Tabs (Metoprolol tartrate) .... Take 1/2 tablet by mouth every morning and take 1/2 tablet by mouth every evening 3)  Lipitor 20 Mg Tabs (Atorvastatin calcium) .... Take 1/2 tablet by mouth once daily 4)  Aciphex 20 Mg Tbec (Rabeprazole sodium) .... Take one tablet by mouth once daily as needed 5)  Vivelle-dot 0.1 Mg/24hr Pttw (Estradiol) .... Use one patch two times a week 6)  Vitamin D3 1000 Unit Tabs (Cholecalciferol) .... Take 2 tablets by mouth once daily 7)  Centrum Silver Tabs (Multiple vitamins-minerals) .... Take 1 tablet by mouth once a day 8)  Avelox 400 Mg Tabs (Moxifloxacin hcl) .Marland Kitchen.. 1 tab by mouth once daily til gone... 9)  Nasonex 50 Mcg/act Susp (Mometasone furoate) .... 2 sp in each nostril at bedtime... 10)  Oxycodone-acetaminophen 5-325 Mg Tabs (Oxycodone-acetaminophen) .Marland Kitchen.. 1 tab by mouth every 6-8 hours as needed for pain... 11)  Mobic 7.5 Mg Tabs (Meloxicam) .... Take 1 tab by mouth once daily for arthritis  pain... 12)  Skelaxin 800 Mg Tabs (Metaxalone) .... Take 1 tab by mouth three times a day as needed for muscle spasm...   Patient Instructions: 1)  Today we discussed the results of your recent evaluation.Marland KitchenMarland Kitchen 2)  I recommend that you continue your sinusitis therapy as outlined in the prev visit... add the Mobic & Skelaxin  as directed for your neck pain & headache... plus use a heating pad on your neck as well. 3)  We will call you with the appt with the Neurologists.Marland Kitchen. 4)  ... and I will contact DrStone in [place] as you requested... 5)  Follow up to be arranged...    ]

## 2010-06-11 NOTE — Assessment & Plan Note (Signed)
Summary: hospital follow up/ok per [place]-jwr/pt coming in @2 :00pm/apc   Primary Care Provider:  Alroy Dust, MD  CC:  13 month ROV & post hosp check....  History of Present Illness: 77 y/o WF here for a follow up visit... please see my prev EMR notes for details- reviewed:  Presented 3/09 w/ headache that sounded like cervicogenic HA, and CSpine films showed degen spondylosis & facet hypertrophy- Rx'd w/ MOBIC 7.5mg /d & SKELAXIN 800mg Tid, heat, etc...  she also c/o "sinus" symptoms Rx'd w/ Avelox, Nasonex, saline etc (symptoms improved)... 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  She was referred to [facility] for Neurology who concurred, and rec physical therapy for her neck and ZANAFLEX... this helped some and her pain is improved... she notes that she cannot tolerate the Percocet (nausea) and that DCN does not work for her... she is asking about Oxycontin which she apparently had yrs ago after knee surgery, but I have denied this... we will call in some MIDRIN for her to try for the headaches as needed...   In the meanwhile we contacted her relatives at [facility] and I sent her scan and notes to Dr. Annamary Rummage at the Dept of Radiology [phone] [address] [place] [address]... their review indicated that they felt her aneurysm was coming off the supraclinoid segment of the left ICA and they requested that we perform a CT Angio for further images... this was done 09/08/07 and showed a 5mm aneurysm projecting posteriorly from the junction of the cavernous and supraclinoid segments of the left ICA... we sent a disc of the study to DrStone as he wanted to measure the size of the aneurysms mouth (helpful in determining whether to plan a coil or stent later)... he asked [place] to repeat the CT Angio is 6 months...   ~  April 11, 2008:  Samantha Bishop informs me that her  granddaughter has broken off her engagement to DrStone at [place] and she will need a local Interventional Radiologist to follow this aneurysm- I have recommended Dr. Kerby Nora & we will set up an appt for her-  her f/u CTAngio 04/08/08 showed no significant change and this is reassuring... she tells me that if she needs surg she wants to go to [facility] to see [facility] doctor... she no longer has any dizziness and she states "my head is better"...   ~  May 21, 2009:  she developed a cellulitis in right lower extremity- treated as outpt w/ Doxy but no better- went to see her Ortho, DrWainer who sent her to hosp for IV antibiotics & adm by [facility] (she has hx DVT/ PE after knee surg 1997) ... VenDopplers were neg- but tender on palp & ?superfic thrombophlebitis entertained- Rx's w/ BacterimDS, along w/ Naprosyn & Percocet for pain...  Post hosp c/o alteration in bowels w/ some blood seen (hx 2 prev Hem surg)- rec> Align + Activia...  hx cardiac arrhythmia in past- AVnodal reentrant tachycard s/p ablation 2003- w/ palpit eval 10/10 DrMcAlhaney>> EKG= NSR rate 66/min, WNL;  2DEcho showed norm LV w/ EF=55-65%, mild AoV thickening, triv MR, LA=18mm;  Holter Monitor showed PAF;  & CDopplers showed calcif plaque in ICA's 0-39% stenoses... DrKlein started [facility] & she takes an extra 1/2 Lopressor Prn symptoms...    Current Problem List:  SINUSITIS, ACUTE (ICD-461.9) - she uses Nasal Saline Mist & NASONEX as  needed... she saw DrRosen for ENT w/ deviated septum found, otherw neg, but it's hard for her to breathe thru her nose...  HYPERTENSION (ICD-401.9) - controlled on LOPRESSOR 50mg - 1/2 tab Bid... BP today is 142/72...  ~  2DEcho 1996 showed mild LVH, norm valves, EF=60%...  ~  NuclearStressTest 4/04 was neg- no infarct or ischemia, EF=69%... similiar to 1999 study.  ~  2DEcho 10/10 showed norm LV w/ EF=55-65%, mild AoV thickening, triv MR, LA=22mm...  Hx of TACHYARRHYTHMIA (ICD-785.0) - hx atrial  tachycardia and AV nodal reentry in 2003... s/p ablation, no recurrent symptoms... last saw [facility] 4/08... referred to drMcAlhaney 10/10 w/ incr palpit & cards eval including>> EKG= NSR rate 66/min, WNL;  2DEcho showed norm LV w/ EF=55-65%, mild AoV thickening, triv MR, LA=23mm;  Holter Monitor showed PAF;  & CDopplers showed calcif plaque in ICA's 0-39% stenoses... DrKlein started [employer] 150mg Bid & she takes an extra 1/2 Lopressor Prn symptoms...  Hx of DEEP VENOUS THROMBOPHLEBITIS (ICD-453.40) & Hx of CELLULITIS, ANKLE (ICD-682.6) - she had a DVT and a small PE after knee surgery in 1997... subseq eval of her veins by the vasc surgeons showed venous stasis disease...  developed cellulitis & ?of superficial thrombophlebitis 12/10- hosp transiently & VenDopplers neg for DVT, treated w/ BacterimDS, Naprosyn, Percocet & improved...  HYPERLIPIDEMIA (ICD-272.4) - on LIPITOR 20mg - 1/2 tab daily + FishOil 2/d... tolerates Rx well.  ~  FLP 11/08 in the Lipid Clinic showing TChol 148, TG 84, HDL 53, LDL 78... LFT's were WNL.Marland Kitchen.  ~  FLP 10/09 showed TChol 162, TG 85, HDL 63, LDL 83  ~  Lipid Clinic f/u 11/10 on Lip10 + FishOil 2/d>> TChol 143, TG 72, HDL 59, LDL 70  GERD (ICD-530.81) - last EGD was 3/06 showing chr gastritis & duodenitis... she takes ACIPHEX 20mg /d...  DIVERTICULOSIS OF COLON (ICD-562.10) & COLONIC POLYPS (ICD-211.3) - last colonoscopy was 1/07 by DrSam showing divertics only... f/u planned 62yrs... (last polyp was 1999 tiny & cauterized).  ~  1/11:  c/o bowel irregularity & some blood seen after hosp for cellulitis- Rx w/ Align/ Activia/ Proctocort cream.  Hx of RENAL CALCULUS (ICD-592.0) - stones seen in kidney on CTAbd 2007, non-obstructing & no hx of symptomatic nephrolithiasis...  DEGENERATIVE JOINT DISEASE (ICD-715.90) - prev hx of arthroscopy w/ DVT and PTE in 1995... she had a right TKR by DrWainer 4/04...  *** apparently she had arrhythmia post op "heart stopped for 18sec &  DrWainer said I can't be put to sleep no more"  LOW BACK PAIN SYNDROME (ICD-724.2) - she has had LBP evauated by Ortho- DrWainer, DrGioffre; and by Neurosurg- DrJenkins in 2003... MRI showed a disc at L3-4 w/ mild spinal stenosis, but he did not rec surg...   NECK PAIN & HEADACHE - Dx w/ occip neuralgia & cervical degen disc dis w/ prev eval here in 2009 & another eval at [facility] 7/10- treated w/ bilat occip nerve blocks 9/10 & improved... refered to [facility] to continue this therapy closer to home...   INTRACRANIAL ANEURYSM (ICD-437.3) *** see above ***  ANXIETY (ICD-300.00)  GYN = DrLomax & last seen 12/08... on VIVELLE & Ca++/ VitD... he did BMD in 10/07 w/ osteopenia...    Allergies: 1)  ! Penicillin 2)  ! Codeine  Comments:  Nurse/Medical Assistant: The patient's medications and allergies were reviewed with the patient and were updated in the Medication and Allergy Lists.  Past History:  Past Medical History:  HYPERTENSION, BORDERLINE (ICD-401.9) ATRIAL FIBRILLATION (ICD-427.31) Hx  of TACHYARRHYTHMIA (ICD-785.0) Hx of DEEP VENOUS THROMBOPHLEBITIS (ICD-453.40) HYPERLIPIDEMIA (ICD-272.4) GERD (ICD-530.81) DIVERTICULOSIS OF COLON (ICD-562.10) COLONIC POLYPS (ICD-211.3) Hx of RENAL CALCULUS (ICD-592.0) DEGENERATIVE JOINT DISEASE (ICD-715.90) NECK PAIN (ICD-723.1) LOW BACK PAIN SYNDROME (ICD-724.2) HEADACHE (ICD-784.0) INTRACRANIAL ANEURYSM (ICD-437.3) Hx of CELLULITIS, ANKLE (ICD-682.6) ANXIETY (ICD-300.00)  Past Surgical History:  S/P T & A S/P nasal surgery in past S/P appendectomy  S/P hysterectomy S/P breast cyst removal S/P hemorroid surgery x 2 S/P ELap for SBO w/ lysis of adhesions 1984 S/P left knee surg in 1997 S/P right TKR in 2004 by Dr Thurston Hole  Family History: Reviewed history from 03/28/2009 and no changes required. Mother-deceased, MI age 58 Father-deceased, MI age 47 Brother deceased alcohol cirrhosis  Social History: Reviewed history from  03/03/2009 and no changes required. No alcohol, tobacco drugs Divorced 3 children, 4 grandchildren, 2 great grandchildren Worked in cigarette factory  Review of Systems      See HPI       The patient complains of dyspnea on exertion, peripheral edema, headaches, and difficulty walking.  The patient denies anorexia, fever, weight loss, weight gain, vision loss, decreased hearing, hoarseness, chest pain, syncope, prolonged cough, hemoptysis, abdominal pain, melena, hematochezia, severe indigestion/heartburn, hematuria, incontinence, muscle weakness, suspicious skin lesions, transient blindness, depression, unusual weight change, abnormal bleeding, enlarged lymph nodes, and angioedema.    Vital Signs:  Patient profile:   77 year old female Height:      66 inches Weight:      170 pounds BMI:     27.54 O2 Sat:      97 % on Room air Temp:     97.4 degrees F oral Pulse rate:   76 / minute BP sitting:   142 / 72  (left arm) Cuff size:   regular  Vitals Entered By: Randell Loop CMA (May 21, 2009 2:00 PM)  O2 Sat at Rest %:  97 O2 Flow:  Room air CC: 13 month ROV & post hosp check... Comments meds updated today   Physical Exam  Additional Exam:  WD, WN, 77 y/o WF in NAD... GENERAL:  Alert & oriented; pleasant & cooperative... HEENT:  [place]/AT, EOM-wnl, PERRLA, EACs-clear, TMs-wnl, NOSE- sl red, THROAT- no exud seen... NECK:  Supple w/ decr ROM; no JVD; normal carotid impulses w/o bruits; no thyromegaly or nodules palpated; no lymphadenopathy. CHEST:  Clear to P & A; without wheezes/ rales/ or rhonchi. HEART:  Regular Rhythm; msc gr 1/6 SEM without rubs or gallops heard... ABDOMEN:  Soft & nontender; normal bowel sounds; no organomegaly or masses detected. EXT: without deformities, mod arthritic changes; no varicose veins/ +venous insuffic/ no edema. Right lower leg w/ red sl tender area medially, skin intact... NEURO:  CN's intact;  no focal neuro deficit... DERM:  No lesions noted;  no rash etc...     Impression & Recommendations:  Problem # 1:  Hx of CELLULITIS, ANKLE (ICD-682.6) Improved & off the BacterimDS now... rec> warm compresses, elevation, no salt, support hose, & continue the Naprosyn/ Percocet.  Problem # 2:  Hx of DEEP VENOUS THROMBOPHLEBITIS (ICD-453.40) As noted above-  VenDopplers were neg for DVT etc...  Problem # 3:  HYPERTENSION, BORDERLINE (ICD-401.9) Controlled on med... Her updated medication list for this problem includes:    Lopressor 50 Mg Tabs (Metoprolol tartrate) .Marland Kitchen... Take 1/2 tablet by mouth every morning and take 1/2 tablet by mouth every evening  Problem # 4:  ATRIAL FIBRILLATION (ICD-427.31) New Dx per DrKlein on Holter-  she's holding NSR  w/rare palit episodes & uses extra 1/2 Lopressor Prn... on PRADAXA Bid now as well. Her updated medication list for this problem includes:    Adult Aspirin Low Strength 81 Mg Tbdp (Aspirin) .Marland Kitchen... Take 1 tablet by mouth once a day    Lopressor 50 Mg Tabs (Metoprolol tartrate) .Marland Kitchen... Take 1/2 tablet by mouth every morning and take 1/2 tablet by mouth every evening  Problem # 5:  HYPERLIPIDEMIA (ICD-272.4) Controlled on Lip10/d... Her updated medication list for this problem includes:    Lipitor 20 Mg Tabs (Atorvastatin calcium) .Marland Kitchen... Take 1/2 tablet by mouth once daily  Problem # 6:  DIVERTICULOSIS OF COLON (ICD-562.10) She's been c/o bowel changes since hosp w/ antibiotics for the cellulitis... +bl in [place] w/ hx hems surgx2 in past, last colon 2007 was OK... rec>> Align, Activia, PROTOCORT Cream...  Problem # 7:  NECK PAIN (ICD-723.1) She had another eval at [facility] w/ rec for occipital nerve blocks and using Lidoderm patches... Referred to DrRauch to get the nerve block Rx closer to home... Her updated medication list for this problem includes:    Adult Aspirin Low Strength 81 Mg Tbdp (Aspirin) .Marland Kitchen... Take 1 tablet by mouth once a day    Oxycodone-acetaminophen 5-325 Mg Tabs  (Oxycodone-acetaminophen) .Marland Kitchen... 1 every 6 hours as needed pain    Naproxen 250 Mg Tabs (Naproxen) .Marland Kitchen... Take 1-2 tabs by mouth two times a day as needed for pain...  Problem # 8:  INTRACRANIAL ANEURYSM (ICD-437.3) Stable-  followed by DrDevershwar here, and by the IR team at [facility]...  Complete Medication List: 1)  Adult Aspirin Low Strength 81 Mg Tbdp (Aspirin) .... Take 1 tablet by mouth once a day 2)  Lopressor 50 Mg Tabs (Metoprolol tartrate) .... Take 1/2 tablet by mouth every morning and take 1/2 tablet by mouth every evening 3)  Lipitor 20 Mg Tabs (Atorvastatin calcium) .... Take 1/2 tablet by mouth once daily 4)  Vitamin D3 1000 Unit Tabs (Cholecalciferol) .... Take 2 tablets by mouth once daily 5)  Ambien 10 Mg Tabs (Zolpidem tartrate) .Marland Kitchen.. 1 by mouth at bedtime as needed for sleep 6)  Fish Oil 1000 Mg Caps (Omega-3 fatty acids) .... 2 caps once daily 7)  Pradaxa 150 Mg Tabs  .... Take two times a day 8)  Colace 100 Mg Caps (Docusate sodium) .... Take 1 capsule by mouth three times a day x7days 9)  Oxycodone-acetaminophen 5-325 Mg Tabs (Oxycodone-acetaminophen) .Marland Kitchen.. 1 every 6 hours as needed pain 10)  Naproxen 250 Mg Tabs (Naproxen) .... Take 1-2 tabs by mouth two times a day as needed for pain... 11)  Famotidine 20 Mg Tabs (Famotidine) .... Take 1 tablet by mouth two times a day x7days 12)  Proctosol Hc 2.5 % Crea (Hydrocortisone) .... Apply to rectal area after each bm & at bedtime... 13)  Pradaxa 150mg   .... Take as directed by drklein...  Other Orders: Prescription Created Electronically [id number])  Patient Instructions: 1)  Today we updated your med list- see below.... 2)  Continue your current meds the same... 3)  For your Bowel Movements:  take the OTC ALIGN Probiotic daily & the ACTIVIA Yogurt daily as well... 4)  For your Hemorroids:  use the PROTOCORT cream after each BM & at bedtime... try the warm Sitz baths etc... 5)  For your Chronic Venous Disease & legs:  No salt, keep  legs elevated & try warmm compresses, wear the support hose when you are up & about... 6)  Let's plan a  follow up visit in 3-4 months... Prescriptions: PROCTOSOL HC 2.5 % CREA (HYDROCORTISONE) apply to rectal area after each BM & at bedtime...  #1 large tube x prn   Entered and Authorized by:   Michele Mcalpine MD   Signed by:   Michele Mcalpine MD on 05/21/2009   Method used:   Print then Give to Patient   RxID:   [id number]

## 2010-06-11 NOTE — Assessment & Plan Note (Signed)
Summary: nw pt cell on rt lower leg/dr.wainer wanted asap   Primary Provider:  Alroy Dust, MD  CC:  new patient/cell on rt lower leg.  History of Present Illness: 77 yo wf with mmp herre for referral from Dr Thurston Hole for RLE cellulitis.  Began in 11/28/2024when R foot became tender and swollen.  Went to ED and got abx. Given abx again by Dr Enis Slipper in Nov but no benefit.   Went to see Dr Thurston Hole in Dec and admitted for 3 days and got IV abx and then d/ced on bactrim for 10 days which helped.  Had dx of superficial thrombophlebitis at that time.  He saw Dr Kriste Basque a few days ago and told her leg was ok.  Dr Thurston Hole saw pt yesterday and rec pt see ID for eval.  Has a R TKR 7 yrs ago.  Has a h/o blood clot in LLE after knee arthroscopy 7 yrs ago.  Also had leg fx on R 2003.  No history of open wounds or ulcers on legs.   Preventive Screening-Counseling & Management  Alcohol-Tobacco     Alcohol drinks/day: 0     Smoking Status: never  Caffeine-Diet-Exercise     Caffeine use/day: 0     Does Patient Exercise: no  Safety-Violence-Falls     Seat Belt Use: yes   Prior Medication List:  ADULT ASPIRIN LOW STRENGTH 81 MG  TBDP (ASPIRIN) Take 1 tablet by mouth once a day LOPRESSOR 50 MG TABS (METOPROLOL TARTRATE) take 1/2 tablet by mouth every morning and take 1/2 tablet by mouth every evening LIPITOR 20 MG TABS (ATORVASTATIN CALCIUM) take 1/2 tablet by mouth once daily VITAMIN D3 1000 UNIT  TABS (CHOLECALCIFEROL) take 2 tablets by mouth once daily AMBIEN 10 MG  TABS (ZOLPIDEM TARTRATE) 1 by mouth at bedtime as needed for sleep FISH OIL 1000 MG CAPS (OMEGA-3 FATTY ACIDS) 2 caps once daily * PRADAXA 150 MG TABS TAKE two times a day COLACE 100 MG CAPS (DOCUSATE SODIUM) Take 1 capsule by mouth three times a day x7days OXYCODONE-ACETAMINOPHEN 5-325 MG TABS (OXYCODONE-ACETAMINOPHEN) 1 every 6 hours as needed pain NAPROXEN 250 MG TABS (NAPROXEN) take 1-2 tabs by mouth two times a day as needed for  pain... FAMOTIDINE 20 MG TABS (FAMOTIDINE) Take 1 tablet by mouth two times a day x7days PROCTOSOL HC 2.5 % CREA (HYDROCORTISONE) apply to rectal area after each BM & at bedtime... * PRADAXA 150MG  Take as directed by DrKlein...   Current Allergies (reviewed today): ! PENICILLIN ! CODEINE Past History:  Past Medical History: Last updated: 05/21/2009  HYPERTENSION, BORDERLINE (ICD-401.9) ATRIAL FIBRILLATION (ICD-427.31) Hx of TACHYARRHYTHMIA (ICD-785.0) Hx of DEEP VENOUS THROMBOPHLEBITIS (ICD-453.40) HYPERLIPIDEMIA (ICD-272.4) GERD (ICD-530.81) DIVERTICULOSIS OF COLON (ICD-562.10) COLONIC POLYPS (ICD-211.3) Hx of RENAL CALCULUS (ICD-592.0) DEGENERATIVE JOINT DISEASE (ICD-715.90) NECK PAIN (ICD-723.1) LOW BACK PAIN SYNDROME (ICD-724.2) HEADACHE (ICD-784.0) INTRACRANIAL ANEURYSM (ICD-437.3) Hx of CELLULITIS, ANKLE (ICD-682.6) ANXIETY (ICD-300.00)  Past Surgical History: Last updated: 05/21/2009  S/P T & A S/P nasal surgery in past S/P appendectomy  S/P hysterectomy S/P breast cyst removal S/P hemorroid surgery x 2 S/P ELap for SBO w/ lysis of adhesions 1984 S/P left knee surg in 1997 S/P right TKR in 2004 by Dr Thurston Hole  Family History: Last updated: 04/06/09 Mother-deceased, MI age 43 Father-deceased, MI age 4 Brother deceased alcohol cirrhosis  Social History: Last updated: 03/03/2009 No alcohol, tobacco drugs Divorced 3 children, 4 grandchildren, 2 great grandchildren Worked in cigarette factory  Risk Factors: Alcohol  Use: 0 (05/28/2009) Caffeine Use: 0 (05/28/2009) Exercise: no (05/28/2009)  Risk Factors: Smoking Status: never (05/28/2009)  Review of Systems       11 systems reviewed and negative except per HPI   Vital Signs:  Patient profile:   77 year old female Height:      66 inches (167.64 cm) Weight:      177.1 pounds (80.50 kg) BMI:     28.69 Temp:     96.5 degrees F (35.83 degrees C) oral Pulse rate:   68 / minute BP sitting:   138  / 64  (left arm)  Vitals Entered By: Baxter Hire) (May 28, 2009 9:12 AM) CC: new patient/cell on rt lower leg Is Patient Diabetic? No Pain Assessment Patient in pain? no      Nutritional Status BMI of 25 - 29 = overweight Nutritional Status Detail appetite is to good per patient  Does patient need assistance? Functional Status Self care Ambulation Normal   Physical Exam  General:  alert and well-developed.   Eyes:  vision grossly intact.   Neck:  supple.   Lungs:  normal respiratory effort and no intercostal retractions.   Heart:  normal rate, regular rhythm, and no murmur.   Abdomen:  soft, non-tender, normal bowel sounds, and no distention.   Msk:  R TKR scarr healed Full movement of R ankle Pulses:  2+ DP and PT Extremities:  RLE with 2+ pitting edema to mid calf. Indurated. Area of skin esp medially has chronic venous stasis changes.  No open sores.    LLE 1+ edema around ankles.  Neurologic:  alert & oriented X3 and cranial nerves II-XII intact.   Skin:  RLE with chronic venous stasis changes Cervical Nodes:  none Psych:  Oriented X3 and memory intact for recent and remote.   Additional Exam:  nov 21st   IMPRESSION:    1.  Soft tissue swelling about the ankle.   2.  Calcification off the lateral malleolus is compatible with old   trauma.  Dec 31s tDopplet Summary:    - No evidence of deep vein or superficial thrombosis involving the     right lower extremity and left lower extremity.   - No evidence of Baker's cyst on the right or left.   Other specific details can be found in the table(s) above.  Prepared   and Electronically Authenticated by    Impression & Recommendations:  Problem # 1:  Hx of CELLULITIS, ANKLE (ICD-682.6) She likely has a mild cellulitis on top of venous stasis changes. Will give a course of doxy given some possible overlying infection but main issue is her chronic venous stasis and prob lymphoedema from prior trauma  and surg on the R leg. Rec elevation, short course of lasix, compression stockings.  Will refer to lymphedema clinic for evaluation (At [facility])  Her updated medication list for this problem includes:    Adult Aspirin Low Strength 81 Mg Tbdp (Aspirin) .Marland Kitchen... Take 1 tablet by mouth once a day    Oxycodone-acetaminophen 5-325 Mg Tabs (Oxycodone-acetaminophen) .Marland Kitchen... 1 every 6 hours as needed pain    Doxycycline Hyclate 100 Mg Caps (Doxycycline hyclate) .Marland Kitchen... 1 by mouth two times a day for 10 days  Orders: T-CBC w/Diff ([id number]) T-Sed Rate (Automated) ([id number]) Diagnostic X-Ray/Fluoroscopy (Diagnostic X-Ray/Flu) Consultation Level IV (84696)  Problem # 2:  HYPERTENSION, BORDERLINE (ICD-401.9) Given some edema I will start a  week of lasix then reevaluate. Her updated medication list for  this problem includes:    Lopressor 50 Mg Tabs (Metoprolol tartrate) .Marland Kitchen... Take 1/2 tablet by mouth every morning and take 1/2 tablet by mouth every evening    Furosemide 20 Mg Tabs (Furosemide) ..... One by mouth once daily  Medications Added to Medication List This Visit: 1)  Doxycycline Hyclate 100 Mg Caps (Doxycycline hyclate) .Marland Kitchen.. 1 by mouth two times a day for 10 days 2)  Furosemide 20 Mg Tabs (Furosemide) .... One by mouth once daily  Patient Instructions: 1)  Follow up one week.  Take lasix for 5 days then stop. 2)  Elevate leg, and put compression stockings on your leg as soon as you get up in the morning. 3)  Take your antibiotics for 10 days. Prescriptions: FUROSEMIDE 20 MG TABS (FUROSEMIDE) one by mouth once daily  #30 x 0   Entered and Authorized by:   Clydie Braun MD   Signed by:   Clydie Braun MD on 05/28/2009   Method used:   Electronically to        [facility] [address] [phone]* (retail)       [address]       [place], [place]  [address]       Ph: [phone] or [phone]       Fax: [phone]   RxID:   [id number] DOXYCYCLINE  HYCLATE 100 MG CAPS (DOXYCYCLINE HYCLATE) 1 by mouth two times a day for 10 days  #20 x 0   Entered and Authorized by:   Clydie Braun MD   Signed by:   Clydie Braun MD on 05/28/2009   Method used:   [facility]        [facility] [address] [phone]* (retail)       [address]       [place], [place]  [address]       Ph: [phone] or [phone]       Fax: [phone]   RxID:   [id number] FUROSEMIDE 20 MG TABS (FUROSEMIDE) one by mouth once daily  #30 x 0   Entered and Authorized by:   Clydie Braun MD   Signed by:   Clydie Braun MD on 05/28/2009   Method used:   [facility] to        [facility] [address] [phone]* (retail)       [address]       [place], [place]  [address]       Ph: [phone] or [phone]       Fax: [phone]   RxID:   [id number] DOXYCYCLINE HYCLATE 100 MG CAPS (DOXYCYCLINE HYCLATE) 1 by mouth two times a day for 10 days  #20 x 0   Entered and Authorized by:   Clydie Braun MD   Signed by:   Clydie Braun MD on 05/28/2009   Method used:   [facility]ronically to        [facility] [address] [phone]* (retail)       [address]       [place], [place]  [address]       Ph: [phone] or [phone]       Fax: [phone]   RxID:   [id number]  Process Orders Check Orders Results:     [facility]: Check successful Tests Sent for requisitioning (May 28, 2009 12:56 PM):     05/28/2009: [facility] -- T-CBC w/Diff [[id number]] (signed)     05/28/2009: [facility] -- T-Sed Rate (Automated) [phone] (signed)

## 2010-06-11 NOTE — Progress Notes (Signed)
   Phone Note From Other Clinic   Call For: Dr Sherryl Manges Summary of Call: Pt had 21 day event monitor placed. Early this am had afib RVR documented. Pt had palpitations w/ this, no presyncope. HR range 47-116. After RVR episode, still in afib, rate 75. Ofc made aware. Initial call taken by: Lavella Hammock Jhoselyn Ruffini PA-C,  March 11, 2009 8:20 AM

## 2010-06-11 NOTE — Progress Notes (Signed)
  Phone Note Outgoing Call   Call placed by: Duncan Dull, RN, BSN,  April 29, 2009 8:43 AM Call placed to: Specialist Summary of Call: Attempted to call [name] again and the only option is to leave a VM. This is the second VM I have left. I need a fax # in order to fax the clearance letter for the pt that was requested by [facility].  Duncan Dull, RN, BSN  April 29, 2009 8:44 AM   Follow-up for Phone Call        Called and s/w pt and let her know that we are not the hold up and that I have called them 3 different times and no one has called me back. She understands and knows that the letter is here for her when she needs it or when she can get a fax # for [place].  Follow-up by: Duncan Dull, RN, BSN,  April 29, 2009 8:56 AM

## 2010-06-11 NOTE — Progress Notes (Signed)
Summary: refills on temazepam, and fluocinolone  Phone Note Call from Patient Call back at Home Phone [phone]   Caller: Patient Call For: Kriste Basque Summary of Call: pt requesting refills on fluocinolone, tempazam.  pt states she would like to pick these up.  i advised pt that she had refills on the fluocinolone but pt states she wants the refills anyway.  please advise if okay to fill, thanks!  last ov w/ SN 10.12.11 Initial call taken by: Boone Master CNA/MA,  April 07, 2010 12:46 PM  Follow-up for Phone Call        per SN---ok to refill meds for pt---rx have been printed out and will call pt once these are ready. Randell Loop CMA  April 07, 2010 1:55 PM   rx has been signed and are ready to be picked up---lmom for pt to make her aware Randell Loop CMA  April 07, 2010 1:58 PM     Prescriptions: TEMAZEPAM 15 MG CAPS (TEMAZEPAM) take one capsule by mouth at bedtime  #30 x 5   Entered by:   Randell Loop CMA   Authorized by:   Michele Mcalpine MD   Signed by:   Randell Loop CMA on 04/07/2010   Method used:   Print then Give to Patient   RxID:   [id number] FLUTICASONE PROPIONATE 50 MCG/ACT SUSP (FLUTICASONE PROPIONATE) 1-2 sprays in each nostril once daily  #1 x 11   Entered by:   Randell Loop CMA   Authorized by:   Michele Mcalpine MD   Signed by:   Randell Loop CMA on 04/07/2010   Method used:   Print then Give to Patient   RxID:   [id number]

## 2010-06-11 NOTE — Progress Notes (Signed)
Summary: diarrhea-flagyl  Phone Note Call from Patient   Caller: Patient Call For: Samantha Bishop Summary of Call: pt want to no if the steroids that were put in her head can cause diarrhea.  [facility] Initial call taken by: Rickard Patience,  March 26, 2010 10:53 AM  Follow-up for Phone Call        Pt states she went to Pain clinic last week and received steroid shots in the back of her head and ever since then she has been having diarrhea and hot flashes. She states she saw her GYN for the hot flashes and he increased  her estrogen. Pt staets she is having to use restroom 10-15 times a day  and nothing she has tried OTC has helped. Please advise.Carron Curie CMA  March 26, 2010 11:00 AM   Additional Follow-up for Phone Call Additional follow up Details #1::        per SN---no steroids cant cause diarrhea----is she having diarrhea 10-15 times per day??   if so she will need to take align once daily, activia yogurt daily, flagyl 250mg    #28  1 by mouth four times dialy x 1 week.  thanks Randell Loop CMA  March 26, 2010 11:59 AM  yes pt is having diarrhea 10-15times per day. I have given recs and sent in rx. Carron Curie CMA  March 26, 2010 12:10 PM      New/Updated Medications: FLAGYL 250 MG TABS (METRONIDAZOLE) Take 1 tablet by mouth four times a day x 1 week Prescriptions: FLAGYL 250 MG TABS (METRONIDAZOLE) Take 1 tablet by mouth four times a day x 1 week  #28 x 0   Entered by:   Carron Curie CMA   Authorized by:   Michele Mcalpine MD   Signed by:   Carron Curie CMA on 03/26/2010   Method used:   [facility]        [facility]  [address] [phone]* (retail)       [address]       [place], [place]  [address]       Ph: [phone] or [phone]       Fax: [phone]   RxID:   [id number]

## 2010-06-11 NOTE — Progress Notes (Signed)
Summary: [facility] appt  Phone Note Call from Patient   Caller: Patient Call For: Iyanah Demont Summary of Call: pt was hospitalized for leg and foot infection need to follow up in 1 week with dr Kriste Basque Initial call taken by: Rickard Patience,  May 12, 2009 1:21 PM  Follow-up for Phone Call        Please advise on appt date and time for this patient. Michel Bickers CMA  May 12, 2009 2:10 PM   any day next week---mon, tues or wed at 3:30pm.  thanks Randell Loop CMA  May 12, 2009 2:15 PM   Appt schedule Wednesday @ 3:30pm per pt. Zackery Barefoot CMA  May 12, 2009 2:22 PM

## 2010-06-11 NOTE — Assessment & Plan Note (Signed)
Summary: last seen by [facility] 2004 for svt pt states she has same sympto...  Medications Added LOPRESSOR 50 MG TABS (METOPROLOL TARTRATE) take 1/2 tablet by mouth every morning and take 1/2 tablet by mouth every evening * PRADAXA 150 MG TABS TAKE two times a day        Visit Type:  Follow-up Primary Provider:  Alroy Dust, MD  CC:  Pt. last seen 2004 for svt.  History of Present Illness:  Samantha Bishop is seen at her own request following the onset of palpitations about 3 or 4 months ago. She describes these as being irregular with flip-flops and stops. They're associated with lightheadedness shortness of breath and chest discomfort. they are very scary. She underwent event loop recording at the suggestion of Dr. Sanjuana Kava and as noted below this has demonstrated atrial fibrillation correlating with her symptoms.  Her thromboembolic risk profile is notable for age and gender. Her chart says that she has hypertension; she denies that.  She has no history of coronary artery disease ;  she does not have exertional chest pain.  She does not have peripheral edema, nocturnal dyspnea, orthopnea.  She is status post slow pathway modification in 2004 for AV node reentry as well as catheter ablation of crista tachycardia  In addition to the aforementioned event recorder she underwent echocardiography demonstrating     Study Conclusions  02/2009            - Left ventricle: The cavity size was normal. Wall thickness was       normal. Systolic function was normal. The estimated ejection       fraction was in the range of 55% to 65%.     - Left atrium: The atrium was mildly dilated.     - Atrial septum: No defect or patent foramen ovale was identified.         Problems Prior to Update: 1)  Dizziness  (ICD-780.4) 2)  Palpitations  (ICD-785.1) 3)  Dizziness  (ICD-780.4) 4)  Encounter For Long-term Use of Anticoagulants  (ICD-V58.61) 5)  Intracranial Aneurysm  (ICD-437.3) 6)  Sinusitis, Acute   (ICD-461.9) 7)  Hypertension  (ICD-401.9) 8)  Hx of Tachyarrhythmia  (ICD-785.0) 9)  Hx of Deep Venous Thrombophlebitis  (ICD-453.40) 10)  Hyperlipidemia  (ICD-272.4) 11)  Gerd  (ICD-530.81) 12)  Diverticulosis of Colon  (ICD-562.10) 13)  Colonic Polyps  (ICD-211.3) 14)  Hx of Renal Calculus  (ICD-592.0) 15)  Degenerative Joint Disease  (ICD-715.90) 16)  Low Back Pain Syndrome  (ICD-724.2) 17)  Neck Pain  (ICD-723.1) 18)  Headache  (ICD-784.0)  Current Medications (verified): 1)  Adult Aspirin Low Strength 81 Mg  Tbdp (Aspirin) .... Take 1 Tablet By Mouth Once A Day 2)  Lopressor 50 Mg Tabs (Metoprolol Tartrate) .... Take 1/2 Tablet By Mouth Every Morning and Take 1/2 Tablet By Mouth Every Evening 3)  Lipitor 20 Mg Tabs (Atorvastatin Calcium) .... Take 1/2 Tablet By Mouth Once Daily 4)  Vitamin D3 1000 Unit  Tabs (Cholecalciferol) .... Take 2 Tablets By Mouth Once Daily 5)  Ambien 10 Mg  Tabs (Zolpidem Tartrate) .Marland Kitchen.. 1 By Mouth At Bedtime As Needed For Sleep 6)  Fish Oil 1000 Mg Caps (Omega-3 Fatty Acids) .... 2 Caps Once Daily  Allergies: 1)  ! Penicillin 2)  ! Codeine  Past History:  Past Surgical History: Last updated: 03/03/2009 S/P T & A S/P nasal surgery in past S/P appendectomy  S/P hysterectomy S/P breast cyst removal S/P hemorroid surgery  x 2 S/P ELap for SBO w/ lysis of adhesions 1984 S/P left knee surg in 1997 S/P right TKR in 2004 by Dr Thurston Hole  Family History: Last updated: 04-09-09 Mother-deceased, MI age 42 Father-deceased, MI age 23 Brother deceased alcohol cirrhosis  Social History: Last updated: 03/03/2009 No alcohol, tobacco drugs Divorced 3 children, 4 grandchildren, 2 great grandchildren Worked in cigarette factory  Past Medical History: 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 SINUSITIS, ACUTE (ICD-461.9) HYPERTENSION (ICD-401.9) Hx of TACHYARRHYTHMIA (ICD-785.0) Hx of DEEP VENOUS THROMBOPHLEBITIS (ICD-453.40) with  PE HYPERLIPIDEMIA (ICD-272.4) GERD (ICD-530.81) DIVERTICULOSIS OF COLON (ICD-562.10) COLONIC POLYPS (ICD-211.3) Hx of RENAL CALCULUS (ICD-592.0) DEGENERATIVE JOINT DISEASE (ICD-715.90) LOW BACK PAIN SYNDROME (ICD-724.2) NECK PAIN (ICD-723.1) HEADACHE (ICD-784.0) Cerebral aneurysm  Family History: Mother-deceased, MI age 4 Father-deceased, MI age 51 Brother deceased alcohol cirrhosis  Review of Systems       full review of systems was negative apart from a history of present illness and past medical history.   Vital Signs:  Patient profile:   77 year old female Height:      66 inches Weight:      175.25 pounds BMI:     28.39 Pulse rate:   72 / minute Pulse rhythm:   regular Resp:     18 per minute BP sitting:   130 / 80  (left arm) Cuff size:   large  Vitals Entered By: Vikki Ports (04/09/2009 3:35 PM)  Physical Exam  General:  Alert and oriented elderely caucasion female appearig her stated age n no acute distress. HEENT  normal . Neck veins were flat; carotids brisk and full without bruits. No lymphadenopathy. Back without kyphosis. Lungs clear. Heart sounds regular with S4 and early systolic murmur . PMI nondisplaced. Abdomen soft with active bowel sounds without midline pulsation or hepatomegaly. Femoral pulses and distal pulses intact. Extremities were without clubbing cyanosis or edema; mild swelling of r anke.Skin warm and dry. Neurological exam grossly normal.  neuro grossly normal without nystagmus    EP Study  Procedure date:  11/16/2002  Findings:      SUMMARY AND CONCLUSION:  The results of the electrophysiologic testing  identified at least two tachycardias, in particular a third atrial  tachycardia that had a lower crista origin, but this may have been coming  from the catheter itself.  Given the difficulties in sustaining the atrial  tachycardia, it was mapped to the mid crista and then was no longer  inducible and it was then elected to not  pursue it further at this time.  Slow pathway modification was undertaken with elimination of antegrade slow  pathway conduction in the substrates of that tachycardia mechanism.   In the event that the patient has recurrent palpitations, an event loop  recording will be useful and then ablation using electron and clinical  mapping would be my next approach.   The patient tolerated the procedure without apparent complications although  she has had problems with right leg pain throughout the procedure and in  fact preceding the procedure.                                               Duke Salvia, M.D.    SCK/MEDQ  D:  11/16/2002   Event Monitor  Procedure date:  04/09/09  Findings:      sinus rhythm;  atrial fibrillation with a controlled ventricular response  EKG  Procedure date:  03/03/2009  Findings:      sinus rhtyhm at 66 intervals 0.15 5.08/0.42 Axis LXX  Impression & Recommendations:  Problem # 1:  ATRIAL FIBRILLATION (ICD-427.31)  The patient has been identified with atrial fibrillation. She has significant associated symptoms. We discussed strategies of rate control versus rhythm control. This included the potential issues of poor arrhythmia as well as it concerned about bradycardia using Augmened av  nodal blocking agents.  at this point she would like to continue to do what she has been doing over the last couple of weeks which is to take an extra half of Lopressor at the time of her recurrent symptoms.  The other issue is anticoagulation. She is a CHADS VASC score of 3 or 4 depending on the history of hypertension. As such she would be appropriate candidate for Coumadin and/or Pradaxa. She was like to take Pradaxa. her echo demonstrates normal left ventricular function.  Orders: TLB-CBC Platelet - w/Differential (85025-CBCD) TLB-BMP (Basic Metabolic Panel-BMET) (80048-METABOL) TLB-TSH (Thyroid Stimulating Hormone) (84443-TSH) EKG w/ Interpretation  (93000)  Problem # 2:  Hx of TACHYARRHYTHMIA (ICD-785.0) s/p RFCA without  evidience of SVT recurrence  Patient Instructions: 1)  Your physician recommends that you HAVE LABS TODAY: CBC, BMET AND TSH 2)  Your physician has recommended you make the following change in your medication: START PRADAXA 150MG  two times a day, A PRESCRIPTION HAS BEEN GIVEN TO YOU TODAY 3)  Your physician recommends that you schedule a follow-up appointment in: 6 MONTHS Prescriptions: PRADAXA 150 MG TABS TAKE two times a day  #60 x 11   Entered by:   Duncan Dull, RN, BSN   Authorized by:   Nathen May, MD, [facility]   Signed by:   Duncan Dull, RN, BSN on 03/28/2009   Method used:   Print then Give to Patient   RxID:   [id number]

## 2010-06-11 NOTE — Progress Notes (Signed)
Summary: rx-LMTCB x 1  Phone Note Call from Patient Call back at Home Phone [phone]   Caller: Patient Call For: Blong Busk Reason for Call: Talk to Nurse Summary of Call: was taking 10 mg sleep aids - changed at last visit to 30 mg sleep aid.  This is too much, makes her more drowsy during the day.  Do you have something in 15 or 20mg ? [facility] Initial call taken by: Eugene Gavia,  October 20, 2009 10:51 AM  Follow-up for Phone Call        Dr. Kriste Basque, would you like patient to try taking 1/2 tab of the temazepam or a new rx for 20mg ?  please advise, thanks! Boone Master CNA/MA  October 20, 2009 11:16 AM   Additional Follow-up for Phone Call Additional follow up Details #1::        per SN----ok to try the 1/2 tab if able.   thanks Randell Loop CMA  October 20, 2009 4:11 PM   can we get a new rx for something else what she has is a capsule--pls advise Additional Follow-up by: Vernie Murders,  October 20, 2009 5:04 PM    Additional Follow-up for Phone Call Additional follow up Details #2::    LMOMTCB Vernie Murders  October 20, 2009 4:18 PM    pt called back and stated that she has been taking the 30mg  capsules apart and spliting the powder in them so that would half of the capsule---per SN---ok to send in temazepam 15mg   1 by mouth at bedtime --pt is aware that this has been sent to her pharmacy Randell Loop CMA  October 20, 2009 5:11 PM   New/Updated Medications: TEMAZEPAM 15 MG CAPS (TEMAZEPAM) take one capsule by mouth at bedtime Prescriptions: TEMAZEPAM 15 MG CAPS (TEMAZEPAM) take one capsule by mouth at bedtime  #30 x 3   Entered by:   Randell Loop CMA   Authorized by:   Michele Mcalpine MD   Signed by:   Randell Loop CMA on 10/20/2009   Method used:   Telephoned to ...       [facility]  [address] [phone]* (retail)       [address]       [place], [place]  [address]       Ph: [phone] or [phone]       Fax: [phone]   RxID:    [id number]

## 2010-06-11 NOTE — Miscellaneous (Signed)
Summary: HIPAA Restrictions  HIPAA Restrictions   Imported By: Florinda Marker 05/28/2009 16:34:23  _____________________________________________________________________  External Attachment:    Type:   Image     Comment:   External Document

## 2010-06-11 NOTE — Assessment & Plan Note (Signed)
Summary: rov/sp   Lipid Clinic Visit      Pleasant patient comes to clinic today for dyslipidemia follow-up.  Her current medication regimen includes simvastatin 20 mg daily and Fish Oil 2 capsules daily. She does not complain of chest pain, shortness of breath, muscle aches, or muscle cramps. Pt is compliant and tolerating current regimen well.  Pt continues to maintain a well balanced diet using portion control method learned from using [employer] for 3 years (lost 35 lbs and has maintained).  Review of exercise habits reveals pt is not exercising  due to feeling tired and weak. She is concerned that Pradaxa is causing fatigue. She has a stationary bike at home which she has not been using, but would be willing to resume if fatigue resolves. She does endorse feeling episodes of atrial fibrillation over the past few weeks.  Of note, pt was recently switched from lopressor to diltiazem due to pt concern for lopressor causing nail fungus.   Lipid Management Provider  Reina Fuse, PharmD  Allergies: 1)  ! Penicillin 2)  ! Codeine   Vital Signs:  Patient profile:   77 year old female Height:      68 inches Weight:      169.50 pounds BMI:     25.87 BP supine:   126 / 80  (left arm)  Impression & Recommendations:  Problem # 1:  HYPERLIPIDEMIA (ICD-272.4) Assessment Unchanged Lipids remain at goal on simvastatin 20 mg and fish oil 2 grams daily. Currently: TC 122 TG 51 HDL 59 LDL 53. Due to interaction with simvastatin and diltiazem, will decrease dose to simvastatin 10 mg daily at bedtime to comply with new FDA recommendations. Continue fish oil 2 grams daily. Encouraged pt to continue current diet and to increase exercise on stationary bike to a minimum of 2-3 times/wk. Discussed that Pradaxa should not cause fatigue, but that atrial fibrillation could cause these symptoms. Encouraged f/u with physician if symptoms persist. Her updated medication list for this problem includes:  Simvastatin 10 Mg Tabs (Simvastatin) .Marland Kitchen... Take one tablet by mouth daily at bedtime.  Patient Instructions: 1)  Start taking simvastatin 10 mg at bedtime (1/2 of 20 mg tab). 2)  Continue taking Fish oil every day. 3)  Great job on diet and portion control. 4)  Increase exercise on stationary bike.  5)  Return to Lipid Clinic in 6 months.

## 2010-06-11 NOTE — Progress Notes (Signed)
Summary: refill request  Phone Note Refill Request Message from:  Patient on April 07, 2010 12:12 PM  Refills Requested: Medication #1:  DILTIAZEM HCL CR 120 MG XR12H-CAP Take one capsule by mouth daily.  Medication #2:  PRADAXA 150 MG CAPS take 1 tab by mouth two times a day...  Medication #3:  SIMVASTATIN 10 MG TABS Take one tablet by mouth daily at bedtime. written rx- pls call when ready-has to send them with forms pt #[phone]   Method Requested: Pick up at Office Initial call taken by: Glynda Jaeger,  April 07, 2010 12:14 PM Caller: Patient [phone] Reason for Call: Talk to Nurse Summary of Call: pt calling re     Prescriptions: DILTIAZEM HCL CR 120 MG XR12H-CAP (DILTIAZEM HCL) Take one capsule by mouth daily.  #90 x 2   Entered by:   Judithe Modest CMA   Authorized by:   Nathen May, MD, [facility]   Signed by:   Judithe Modest CMA on 04/08/2010   Method used:   Print then Give to Patient   RxID:   [id number] PRADAXA 150 MG CAPS (DABIGATRAN ETEXILATE MESYLATE) take 1 tab by mouth two times a day...  #180 x 2   Entered by:   Judithe Modest CMA   Authorized by:   Nathen May, MD, [facility]   Signed by:   Judithe Modest CMA on 04/08/2010   Method used:   Print then Give to Patient   RxID:   [id number] SIMVASTATIN 10 MG TABS (SIMVASTATIN) Take one tablet by mouth daily at bedtime.  #90 x 2   Entered by:   Judithe Modest CMA   Authorized by:   Nathen May, MD, [facility]   Signed by:   Judithe Modest CMA on 04/08/2010   Method used:   Print then Give to Patient   RxID:   [id number] DILTIAZEM HCL CR 120 MG XR12H-CAP (DILTIAZEM HCL) Take one capsule by mouth daily.  #90 x 2   Entered by:   Judithe Modest CMA   Authorized by:   Nathen May, MD, [facility]   Signed by:   Judithe Modest CMA on 04/08/2010   Method used:   Handwritten   RxID:   [id number] SIMVASTATIN 10 MG TABS (SIMVASTATIN) Take one tablet by mouth daily at bedtime.   #90 x 2   Entered by:   Judithe Modest CMA   Authorized by:   Nathen May, MD, [facility]   Signed by:   Judithe Modest CMA on 04/08/2010   Method used:   Handwritten   RxID:   [id number] PRADAXA 150 MG CAPS (DABIGATRAN ETEXILATE MESYLATE) take 1 tab by mouth two times a day...  #180 x 2   Entered by:   Judithe Modest CMA   Authorized by:   Nathen May, MD, [facility]   Signed by:   Judithe Modest CMA on 04/08/2010   Method used:   Handwritten   RxID:   [id number]

## 2010-08-05 ENCOUNTER — Telehealth: Payer: Self-pay | Admitting: Pulmonary Disease

## 2010-08-05 MED ORDER — MOMETASONE FUROATE 50 MCG/ACT NA SUSP: 2.0000 | Freq: Every day | NASAL | Status: DC

## 2010-08-05 NOTE — Telephone Encounter (Signed)
Per SN---ok for pt to have nasonex 2 sprays in each nostril qhs---this has been sent to pts pharmacy and pt is aware

## 2010-08-05 NOTE — Telephone Encounter (Signed)
Pt states her nasal passages feel "stuffy" at night due to all the pollen in the air and she would like to have RX for Nasonex. She states she has used this in the past and it always helps. She denies any sneezing or watery eyes and no cough. Pls advise.  ALLERGIES: codeine and PCN

## 2010-08-10 LAB — COMPREHENSIVE METABOLIC PANEL
ALT: 16 U/L (ref 0–35)
AST: 17 U/L (ref 0–37)
Albumin: 3.6 g/dL (ref 3.5–5.2)
Alkaline Phosphatase: 50 U/L (ref 39–117)
BUN: 9 mg/dL (ref 6–23)
CO2: 29 mEq/L (ref 19–32)
Calcium: 9.2 mg/dL (ref 8.4–10.5)
Chloride: 107 mEq/L (ref 96–112)
Creatinine, Ser: 0.57 mg/dL (ref 0.4–1.2)
GFR calc Af Amer: >60 mL/min (ref >60)
GFR calc non Af Amer: >60 mL/min (ref >60)
Glucose, Bld: 121 mg/dL — ABNORMAL HIGH (ref 70–99)
Potassium: 3.6 mEq/L (ref 3.5–5.1)
Sodium: 140 mEq/L (ref 135–145)
Total Bilirubin: 0.3 mg/dL (ref 0.3–1.2)
Total Protein: 6.7 g/dL (ref 6.0–8.3)

## 2010-08-10 LAB — URINALYSIS, ROUTINE W REFLEX MICROSCOPIC
Bilirubin Urine: NEGATIVE
Glucose, UA: NEGATIVE mg/dL
Ketones, ur: NEGATIVE mg/dL
Leukocytes, UA: NEGATIVE
Nitrite: NEGATIVE
Protein, ur: NEGATIVE mg/dL
Specific Gravity, Urine: 1.013 (ref 1.005–1.030)
Urobilinogen, UA: 0.2 mg/dL (ref 0.0–1.0)
pH: 7.5 (ref 5.0–8.0)

## 2010-08-10 LAB — LIPID PANEL
Cholesterol: 126 mg/dL (ref 0–200)
HDL: 52 mg/dL (ref >39)
LDL Cholesterol: 53 mg/dL (ref 0–99)
Total CHOL/HDL Ratio: 2.4 RATIO
Triglycerides: 104 mg/dL (ref <150)
VLDL: 21 mg/dL (ref 0–40)

## 2010-08-10 LAB — HEMOGLOBIN A1C
Hgb A1c MFr Bld: 6.7 % — ABNORMAL HIGH (ref 4.6–6.1)
Mean Plasma Glucose: 146 mg/dL

## 2010-08-10 LAB — URINE CULTURE
Colony Count: NO GROWTH
Culture: NO GROWTH

## 2010-08-10 LAB — URINE MICROSCOPIC-ADD ON

## 2010-08-10 LAB — CBC
HCT: 32.9 % — ABNORMAL LOW (ref 36.0–46.0)
HCT: 34 % — ABNORMAL LOW (ref 36.0–46.0)
Hemoglobin: 11.3 g/dL — ABNORMAL LOW (ref 12.0–15.0)
Hemoglobin: 11.7 g/dL — ABNORMAL LOW (ref 12.0–15.0)
MCHC: 34.3 g/dL (ref 30.0–36.0)
MCHC: 34.5 g/dL (ref 30.0–36.0)
MCV: 90.9 fL (ref 78.0–100.0)
MCV: 91 fL (ref 78.0–100.0)
Platelets: 218 10*3/uL (ref 150–400)
Platelets: 230 10*3/uL (ref 150–400)
RBC: 3.62 MIL/uL — ABNORMAL LOW (ref 3.87–5.11)
RBC: 3.74 MIL/uL — ABNORMAL LOW (ref 3.87–5.11)
RDW: 13 % (ref 11.5–15.5)
RDW: 13 % (ref 11.5–15.5)
WBC: 5.5 10*3/uL (ref 4.0–10.5)
WBC: 7.3 10*3/uL (ref 4.0–10.5)

## 2010-08-10 LAB — CULTURE, BLOOD (ROUTINE X 2)
Culture: NO GROWTH
Culture: NO GROWTH

## 2010-08-10 LAB — TSH: TSH: 3.685 u[IU]/mL (ref 0.350–4.500)

## 2010-08-13 ENCOUNTER — Telehealth: Payer: Self-pay | Admitting: Internal Medicine

## 2010-08-14 MED ORDER — DABIGATRAN ETEXILATE MESYLATE 150 MG PO CAPS: 150.0000 mg | ORAL_CAPSULE | Freq: Two times a day (BID) | ORAL | Status: DC

## 2010-08-14 NOTE — Telephone Encounter (Signed)
Script sent to local pharmacy.  

## 2010-08-18 ENCOUNTER — Encounter: Payer: Self-pay | Admitting: Pulmonary Disease

## 2010-08-20 ENCOUNTER — Ambulatory Visit (INDEPENDENT_AMBULATORY_CARE_PROVIDER_SITE_OTHER): Payer: Medicare Other | Admitting: Pulmonary Disease

## 2010-08-20 ENCOUNTER — Encounter: Payer: Self-pay | Admitting: Pulmonary Disease

## 2010-08-20 DIAGNOSIS — M542 Cervicalgia: Secondary | ICD-10-CM

## 2010-08-20 DIAGNOSIS — I4891 Unspecified atrial fibrillation: Secondary | ICD-10-CM

## 2010-08-20 DIAGNOSIS — F411 Generalized anxiety disorder: Secondary | ICD-10-CM

## 2010-08-20 DIAGNOSIS — L03119 Cellulitis of unspecified part of limb: Secondary | ICD-10-CM

## 2010-08-20 DIAGNOSIS — I10 Essential (primary) hypertension: Secondary | ICD-10-CM

## 2010-08-20 DIAGNOSIS — I671 Cerebral aneurysm, nonruptured: Secondary | ICD-10-CM

## 2010-08-20 DIAGNOSIS — D126 Benign neoplasm of colon, unspecified: Secondary | ICD-10-CM

## 2010-08-20 DIAGNOSIS — M1990 Unspecified osteoarthritis, unspecified site: Secondary | ICD-10-CM

## 2010-08-20 DIAGNOSIS — L02419 Cutaneous abscess of limb, unspecified: Secondary | ICD-10-CM

## 2010-08-20 DIAGNOSIS — E785 Hyperlipidemia, unspecified: Secondary | ICD-10-CM

## 2010-08-20 DIAGNOSIS — K219 Gastro-esophageal reflux disease without esophagitis: Secondary | ICD-10-CM

## 2010-08-20 MED ORDER — HYDROCODONE-ACETAMINOPHEN 5-500 MG PO TABS: 1.0000 | ORAL_TABLET | Freq: Three times a day (TID) | ORAL | Status: DC | PRN

## 2010-08-20 NOTE — Patient Instructions (Signed)
Today we updated your med list in our EPIC system...    We refilled your Hydrocodone per your request...  Let's plan a follow up visit in 6 months... You will need FASTING blood work at that time & we will consider scheduling another Aneurysm study .Marland KitchenMarland Kitchen

## 2010-08-20 NOTE — Progress Notes (Signed)
Subjective:    Patient ID: Samantha Bishop, female    DOB: [birth year], 77 y.o.   MRN: [id number]  HPI 77 y/o WF here for a follow up visit... She has mult medical problems including:  HBP;  Hx PAT- s/p ablation2003;  PAF on Pradaxa;  HxDVT-resolved;  Hx recurrent Cellulitis right ankle;  Hyperchol;  GERD/ Gastirits;  Divertics/ Colon polyps;  Hx Kidney stones;  DJD w/ right TKR;  LBP;  Osteopenia;  NeckPain & HAs;  Intracranial Aneurysm;  Anxiety & chronic persistant insomnia...  ~  February 18, 2010:  She had f/u for her DJD & cellulitis by DrWainer & DrFitzgerald 5/11- he has kept her on suppressive Keflex 500mg /d, & compression hose (he suggested stopping the Keflex after 79mo to see if she had any recurrence)...  she has onychomycosis & DrTuckman wanted to use Lamisil, & when DrKlein saw her for Cards f/u 8/11 (AFib & palpit)- he changed Lopressor to CardizemCD 120mg /d... she now requests a perscription for Fluocinolone acetonide topical solution 0.01% stating this worked well for her daughter...    Her CC today is chronic daily HAs for which she had an eval in [place] including shots in her neck etc... she was then [place] further treatment but he needed OK from DrKlein to hold the Pradaxa before any further interventions> OK to hold Pradaxa for 5d prior & she is waiting for f/u appt w/ DrRauch...  ~  August 20, 2010:  79mo ROV & review- see prob list below, she is off the prev Keflex suppressive Rx w/o obvious recurrence of her LE cellulitis; she is not fasting today & doesn't want lab work at this time, she will be due for f/u CTA in about 79mo...      Problem List:  SINUSITIS, ACUTE (ICD-461.9) - she uses Nasal Saline Mist & FLONASE as needed... Prev eval DrRosen for ENT w/ deviated septum found, otherw neg exam, but it's hard for her to breathe thru her nose...  HYPERTENSION (ICD-401.9) - controlled on DILTIAZEMCD 120mg /d...  ~  Baseline EKG= NSR, WNL... ~   2DEcho 1996 showed mild LVH, norm valves, EF=60%... ~  NuclearStressTest 4/04 was neg- no infarct or ischemia, EF=69%... similiar to 1999 study. ~  2DEcho 10/10 showed norm LV w/ EF=55-65%, mild AoV thickening, triv MR, LA=30mm...  Hx of TACHYARRHYTHMIA (PAT) Hx PAF - hx atrial tachycardia and AV nodal reentry in 2003... s/p ablation, no recurrent symptoms... referred to [facility] 10/10 w/ incr palpit & Holter Monitor showed PAF; DrKlein started PRADAXA 150mg Bid.  Hx of DEEP VENOUS THROMBOPHLEBITIS (ICD-453.40)  Hx of CELLULITIS, ANKLE (ICD-682.6) - she had a DVT and a small PE after knee surgery in 1997... subseq eval of her veins by the vasc surgeons showed venous stasis disease...  developed cellulitis & ?of superficial thrombophlebitis 12/10- hosp transiently & VenDopplers neg for DVT, treated w/ BacterimDS, Naprosyn, Percocet & initially improved...  ~  1/11:  DrWainer sent her to DrFitzgerald for recurrent cellulitis RLE & he started prolonged course of Keflex & local wraps... ~  10/11:  She remains on suppressive therapy w/ Keflex 500mg /d from DrFitzgerald. ~  4/12:  She reports stopping the Keflex on her own ~1/12 & states no recurrent prob so far, doing OK.  HYPERLIPIDEMIA (ICD-272.4) - prev on Lipitor10 + FishOil 2/d... Lipid clinic ch to SIMVASTATIN 20mg /d for $$ reasons. ~  Lipid Clinic f/u 11/10 on Lip10 + FishOil 2/d showed TChol 143, TG 72, HDL 59, LDL 70 ~  FLP 4/11 on Simva20 showed TChol 130, TG 54, HDL 64, LDL 55 ~  FLP 10/11 on Simva20 showed TChol 122, TG 51, HDL 59, LDL 53  GERD (ICD-530.81) - last EGD was 3/06 showing chr gastritis & duodenitis... prev on Aciphex20, now Prn Pepcid vs Prilosec...  DIVERTICULOSIS OF COLON (ICD-562.10)   COLONIC POLYPS (ICD-211.3) - last colonoscopy was 1/07 by DrSam showing divertics only... f/u planned 66yrs... (last polyp was 1999 tiny & cauterized). ~  1/11:  c/o bowel irregularity & some blood seen after hosp for cellulitis- Rx w/  Align/ Activia/ Proctocort cream. ~  3/12:  Now 77 y/o & she doesn't want repeat colonoscopy while on Pradaxa...  Hx of RENAL CALCULUS (ICD-592.0) - stones seen in kidney on CTAbd 2007, non-obstructing & no hx of symptomatic nephrolithiasis.  DEGENERATIVE JOINT DISEASE (ICD-715.90) - prev hx of arthroscopy w/ post-op DVT and PTE in 1995... she had a right TKR by DrWainer 4/04> apparently she had arrhythmia post op "heart stopped for 18sec & DrWainer said I can't be put to sleep no more"  LOW BACK PAIN SYNDROME (ICD-724.2) - she has had LBP evauated by Ortho- DrWainer, DrGioffre; and by Neurosurg- DrJenkins in 2003... MRI showed a disc at L3-4 w/ mild spinal stenosis, but he did not rec surg...   OSTEOPENIA (ICD-733.90) - BMD followed by DrLomax, Gyn> -1.6 in [facility]... on Vivelle, Calcium, VitD.  NECK PAIN & HEADACHE - Dx w/ occip neuralgia & cervical degen disc dis w/ prev eval DrWeymann in 2009 & another eval at [facility] 7/10- treated w/ bilat occip nerve blocks 9/10 & improved... refered to DrRauck to continue this therapy closer to home. ~  4/12:  She reports that DrRauck tried shots for her cervicogenic HAs but they didn't help, she now uses OTC analgesics as needed.  INTRACRANIAL ANEURYSM (ICD-437.3) - MRI/ MRA done 3/09 for HA eval revealed atophy, sm vessel dis, and an incidental 5mm aneurysm projecting posteriorly from the left ICA (cavernous segment)...  we contacted her relatives at [facility] and sent her scan and notes to Dr. Annamary Rummage at the Dept of Radiology [phone] [address] [place] [address]... they requested that we perform a CT Angio- done 09/08/07 and showed a 5mm aneurysm projecting posteriorly from the junction of the cavernous and supraclinoid segments of the left ICA...   ~  f/u CTAngio 04/08/08 showed no significant change- she tells me that if she needs surg she wants to go to [facility] to see [facility] doctor...  ~  CDopplers 2010 showed calcif plaque in  ICA's 0-39% stenoses. ~  f/u CTAngio 4/11 showed stable left cavernous ICA aneurysm measuring 4 x 5mm.  ANXIETY (ICD-300.00) & INSOMNIA, CHRONIC (ICD-307.42) - she notes that Ambien 10mg  w/o help and wants something stronger- try RESTORIL 15mg  Qhs Prn...  GYN = DrLomax & seen 5/11... on VIVELLE & Ca++/ VitD... he did BMD in 10/07 w/ osteopenia, & f/u 5/11 showed min deterioration w/ TScores -0.8 in spine, and -1.6 in [facility]...   Past Surgical History  Procedure Date  . Tonsillectomy and adenoidectomy   . Appendectomy   . Abdominal hysterectomy   . Knee surgery   . Breast cyst excision     Outpatient Encounter Prescriptions as of 08/20/2010  Medication Sig Dispense Refill  . Cholecalciferol (VITAMIN D3) 1000 UNITS CAPS Take 2 tablets by mouth daily.        . dabigatran (PRADAXA) 150 MG CAPS Take 1 capsule (150 mg total) by mouth  every 12 (twelve) hours.  60 capsule  5  . diltiazem (DILACOR XR) 120 MG 24 hr capsule Take 120 mg by mouth daily.        Marland Kitchen estradiol (VIVELLE-DOT) 0.05 MG/24HR Place 1 patch onto the skin 2 (two) times a week.        . fish oil-omega-3 fatty acids 1000 MG capsule Take 2 g by mouth daily.        . mometasone (NASONEX) 50 MCG/ACT nasal spray 2 sprays by Nasal route at bedtime.  17 g  5  . simvastatin (ZOCOR) 10 MG tablet Take 10 mg by mouth at bedtime.        . temazepam (RESTORIL) 15 MG capsule Take 15 mg by mouth at bedtime as needed.        Marland Kitchen azithromycin (ZITHROMAX) 250 MG tablet Take 2 tablets by mouth on day 1, followed by 1 tablet by mouth daily for 4 days.        . cephALEXin (KEFLEX) 500 MG capsule Take 500 mg by mouth daily.        . fluocinolone (VANOS) 0.01 % cream Apply topically 2 (two) times daily.        . metroNIDAZOLE (FLAGYL) 250 MG tablet Take 250 mg by mouth 4 (four) times daily. For 1 week         Allergies  Allergen Reactions  . Codeine     REACTION: causes nausea  . Penicillins     REACTION: rash    Review of Systems        See  HPI - all other systems neg except as noted...      The patient complains of dyspnea on exertion and sl peripheral edema.  The patient denies anorexia, fever, weight loss, weight gain, vision loss, decreased hearing, hoarseness, chest pain, syncope, prolonged cough, headaches, hemoptysis, abdominal pain, melena, hematochezia, severe indigestion/heartburn, hematuria, incontinence, muscle weakness, suspicious skin lesions, transient blindness, difficulty walking, depression, unusual weight change, abnormal bleeding, enlarged lymph nodes, and angioedema.     Objective:   Physical Exam     WD, WN, 77 y/o WF in NAD... GENERAL:  Alert & oriented; pleasant & cooperative... HEENT:  [place]/AT, EOM-wnl, PERRLA, EACs-clear, TMs-wnl, NOSE- sl red, THROAT- no exud seen... NECK:  Supple w/ decrROM; no JVD; normal carotid impulses w/o bruits; no thyromegaly or nodules palpated; no lymphadenopathy. CHEST:  Clear to P & A; without wheezes/ rales/ or rhonchi. HEART:  Regular Rhythm; msc gr 1/6 SEM without rubs or gallops heard... ABDOMEN:  Soft & nontender; normal bowel sounds; no organomegaly or masses detected. EXT: without deformities, mod arthritic changes; no varicose veins/ +venous insuffic/ tr edema. Right lower leg wrapped, support hose, less discomfort... NEURO:  CN's intact;  no focal neuro deficit... DERM:  No lesions noted; no rash etc... just chr ven insuffic changes in LE's...   Assessment & Plan:   HBP>  Controlled on the Diltiazem + her diet etc;  Continue same...  PAF>  Followed by DrKlein & stable on the Diltiazem & Pradaxa;  She notes rare epis of palpit but they are min, brief, & self-limited...  CELLULITIS>  She stopped the Keflex suppressive therapy several months ago & so far so good w/o recurrent infection, pain, swelling, etc...  CHOL>  Followed in the [facility] & stable on Simva20 w/ good FLP 10/11 as noted above...  GI>  Stable on prn meds and she is not inclined to pursue another  colonoscopy...  HEADACHES>  She continues  to have freq ?daily? HAs but they are not that bad she says & she manages w/ OTC meds...  ANEURYSM>  Clinically stable w/o vasc or cerebral ischemic symptoms;  We plan f/u CTA in 6 more months...  ANXIETY>  She uses Restoril for sleep.Marland KitchenMarland Kitchen

## 2010-08-22 ENCOUNTER — Encounter: Payer: Self-pay | Admitting: Pulmonary Disease

## 2010-08-24 ENCOUNTER — Telehealth: Payer: Self-pay | Admitting: Internal Medicine

## 2010-08-24 DIAGNOSIS — Z79899 Other long term (current) drug therapy: Secondary | ICD-10-CM

## 2010-08-24 NOTE — Telephone Encounter (Signed)
Pt needs refill of pradaxa for [facility] day supply at [facility] [address]-said she called 2 weeks ago to request, don't see that message-needs asap

## 2010-08-24 NOTE — Telephone Encounter (Signed)
Sorry will route to Chrisitine York to see if labs need to be drawn to fill prardaxa

## 2010-08-24 NOTE — Telephone Encounter (Signed)
Will route to kelly lanier to see if pt needs labs for paradaxa

## 2010-08-25 MED ORDER — DABIGATRAN ETEXILATE MESYLATE 150 MG PO CAPS: 150.0000 mg | ORAL_CAPSULE | Freq: Two times a day (BID) | ORAL | Status: DC

## 2010-08-25 NOTE — Telephone Encounter (Signed)
PRADAXA  FILLED AT PT'S REQUEST  PT INFORMED NEEDS TO COME IN FOR CBC BMET  AGREES WILL COME IN NEXT WEEK  FOR CBC BMET  DX [id number] .CY

## 2010-09-08 ENCOUNTER — Other Ambulatory Visit (INDEPENDENT_AMBULATORY_CARE_PROVIDER_SITE_OTHER): Payer: Medicare Other | Admitting: *Deleted

## 2010-09-08 DIAGNOSIS — E7800 Pure hypercholesterolemia, unspecified: Secondary | ICD-10-CM

## 2010-09-08 DIAGNOSIS — Z79899 Other long term (current) drug therapy: Secondary | ICD-10-CM

## 2010-09-08 LAB — BASIC METABOLIC PANEL
BUN: 15 mg/dL (ref 6–23)
CO2: 26 mEq/L (ref 19–32)
Calcium: 8.8 mg/dL (ref 8.4–10.5)
Chloride: 108 mEq/L (ref 96–112)
Creatinine, Ser: 0.6 mg/dL (ref 0.4–1.2)
GFR: 94.09 mL/min (ref >60.00)
Glucose, Bld: 80 mg/dL (ref 70–99)
Potassium: 3.8 mEq/L (ref 3.5–5.1)
Sodium: 143 mEq/L (ref 135–145)

## 2010-09-08 LAB — CBC WITH DIFFERENTIAL/PLATELET
Basophils Absolute: 0.1 10*3/uL (ref 0.0–0.1)
Basophils Relative: 0.5 % (ref 0.0–3.0)
Eosinophils Absolute: 0.1 10*3/uL (ref 0.0–0.7)
Eosinophils Relative: 1 % (ref 0.0–5.0)
HCT: 40.1 % (ref 36.0–46.0)
Hemoglobin: 13.4 g/dL (ref 12.0–15.0)
Lymphocytes Relative: 24.2 % (ref 12.0–46.0)
Lymphs Abs: 2.6 10*3/uL (ref 0.7–4.0)
MCHC: 33.4 g/dL (ref 30.0–36.0)
MCV: 92.2 fl (ref 78.0–100.0)
Monocytes Absolute: 1 10*3/uL (ref 0.1–1.0)
Monocytes Relative: 9.1 % (ref 3.0–12.0)
Neutro Abs: 6.9 10*3/uL (ref 1.4–7.7)
Neutrophils Relative %: 65.2 % (ref 43.0–77.0)
Platelets: 222 10*3/uL (ref 150.0–400.0)
RBC: 4.35 Mil/uL (ref 3.87–5.11)
RDW: 14.1 % (ref 11.5–14.6)
WBC: 10.6 10*3/uL — ABNORMAL HIGH (ref 4.5–10.5)

## 2010-09-08 LAB — HEPATIC FUNCTION PANEL
ALT: 18 U/L (ref 0–35)
AST: 15 U/L (ref 0–37)
Albumin: 3.7 g/dL (ref 3.5–5.2)
Alkaline Phosphatase: 35 U/L — ABNORMAL LOW (ref 39–117)
Bilirubin, Direct: 0 mg/dL (ref 0.0–0.3)
Total Bilirubin: 0.4 mg/dL (ref 0.3–1.2)
Total Protein: 6.6 g/dL (ref 6.0–8.3)

## 2010-09-08 LAB — LIPID PANEL
Cholesterol: 163 mg/dL (ref 0–200)
HDL: 63.6 mg/dL (ref >39.00)
LDL Cholesterol: 81 mg/dL (ref 0–99)
Total CHOL/HDL Ratio: 3
Triglycerides: 94 mg/dL (ref 0.0–149.0)
VLDL: 18.8 mg/dL (ref 0.0–40.0)

## 2010-09-10 ENCOUNTER — Ambulatory Visit (INDEPENDENT_AMBULATORY_CARE_PROVIDER_SITE_OTHER): Payer: Medicare Other

## 2010-09-10 VITALS — BP 126/76 | Wt 171.4 lb

## 2010-09-10 DIAGNOSIS — E785 Hyperlipidemia, unspecified: Secondary | ICD-10-CM

## 2010-09-10 NOTE — Patient Instructions (Signed)
1.) Continue taking simvastatin 10 mg daily. 2.) Keep up the good work with your healthy diet! 3.) Recheck lipid panel and follow-up in 1 year.

## 2010-09-10 NOTE — Assessment & Plan Note (Signed)
LDL at goal. Slightly increased from last visit after decreasing simvastatin dose. Current labs reveal: TC 163, HDL 63.6, TG 94, LDL 81. Controlled on current regimen of simvastatin, fish oil, and heart healthy diet. Encouraged continued medication and diet adherence. Pt has been well controlled for over 3 years. Instructed pt to follow-up with Dr. Graciela Husbands and Dr. Kriste Basque as scheduled. Lipids should be followed every 6 months to yearly.

## 2010-09-10 NOTE — Progress Notes (Signed)
Pt presents for dyslipidemia follow-up. Her current medication regimen includes simvastatin 10 mg daily and fish oil 2 capsules daily. Simvastatin was reduced from 20 mg to 10 mg at last visit 6 months ago due to interaction with diltiazem. Pt has no complaints, including no chest pain, shortness of breath, muscle aches, or muscle cramps. Pt is compliant and tolerating current regimen well. She continues to adhere to a heart healthy diet and eats minimally in order to maintain weight. 24 hr diet recall as follows:  Breakfast: 1/2 bowl of cereal, piece of fruit Lunch: salad, sliced [place] Dinner: cooks at home, vegetables, slide [place] or chicken  Pt does not currently perform an exercise regimen, but she does push mow her lawn once a week and works outside in her yard.   Current Outpatient Prescriptions  Medication Sig Dispense Refill  . Cholecalciferol (VITAMIN D3) 1000 UNITS CAPS Take 2 tablets by mouth daily.        . dabigatran (PRADAXA) 150 MG CAPS Take 1 capsule (150 mg total) by mouth every 12 (twelve) hours.  180 capsule  3  . diltiazem (DILACOR XR) 120 MG 24 hr capsule Take 120 mg by mouth daily.        Marland Kitchen estradiol (VIVELLE-DOT) 0.05 MG/24HR Place 1 patch onto the skin 2 (two) times a week.        . fish oil-omega-3 fatty acids 1000 MG capsule Take 2 g by mouth daily.        . fluocinolone (VANOS) 0.01 % cream Apply topically 2 (two) times daily.        Marland Kitchen HYDROcodone-acetaminophen (VICODIN) 5-500 MG per tablet Take 1 tablet by mouth every 8 (eight) hours as needed. For severe pain.  90 tablet  5  . mometasone (NASONEX) 50 MCG/ACT nasal spray 2 sprays by Nasal route at bedtime.  17 g  5  . simvastatin (ZOCOR) 10 MG tablet Take 10 mg by mouth at bedtime.        . temazepam (RESTORIL) 15 MG capsule Take 15 mg by mouth at bedtime as needed.        Marland Kitchen DISCONTD: azithromycin (ZITHROMAX) 250 MG tablet Take 2 tablets by mouth on day 1, followed by 1 tablet by mouth daily for 4 days.        Marland Kitchen  DISCONTD: cephALEXin (KEFLEX) 500 MG capsule Take 500 mg by mouth daily.        Marland Kitchen DISCONTD: metroNIDAZOLE (FLAGYL) 250 MG tablet Take 250 mg by mouth 4 (four) times daily. For 1 week

## 2010-09-11 ENCOUNTER — Telehealth: Payer: Self-pay | Admitting: Internal Medicine

## 2010-09-11 NOTE — Telephone Encounter (Signed)
Pt returning Samantha Bishop. Pt does not know why nurse called her.

## 2010-09-11 NOTE — Telephone Encounter (Signed)
Pt calling stating we called her and she does not know why--LM on voice mail that all labs were normal and dr Graciela Husbands wanted her to know--nt

## 2010-09-18 ENCOUNTER — Telehealth: Payer: Self-pay | Admitting: Internal Medicine

## 2010-09-18 NOTE — Telephone Encounter (Signed)
I called the pharmacy to d/c automatic refills. This has been done. They stated the patient could have d/c'ed this herself. I have called the pt and she is aware this has been done.

## 2010-09-18 NOTE — Telephone Encounter (Signed)
Pt calling to ask that we [facility] [address] and ask them to stop her automatic refills, she keeps getting calls that she has more refills and she hasn't taken what she already has and when she called them, they said we had to call

## 2010-09-22 NOTE — Assessment & Plan Note (Signed)
[facility]                               LIPID CLINIC NOTE   NAME:[name], [name]                      MRN:          [id number]  DATE:03/20/2007                            DOB:          09-Mar-1925    Ms. Samantha Bishop is seen back in the lipid clinic for further evaluation and  medication titration associated with her hyperlipidemia.  The patient is  currently taking Lipitor 10 mg daily.  The patient has continued  complaints of hair loss.  She has been dieting and has lost 30 pounds.  She has noted fewer muscle cramps associated with her physical activity  as she is riding her stationary bicycle regularly; however, her hair  loss is her chief complaint.   PAST MEDICAL HISTORY:  Pertinent for atrial flutter, status post RF  ablation, hyperlipidemia, pulmonary embolus, deep vein thrombosis, and  left knee issues requiring surgery.   PHYSICAL EXAMINATION:  Weight today is 165 pounds, blood pressure is  118/70, heart rate is 56.   Labs on March 14, 2007 revealed total cholesterol 148, triglycerides  84, HDL 53.1, LDL 78, liver function tests are within normal limits.   ASSESSMENT:  The patient's lipids are at goal, however, she continues to  have significant issues regarding her hair loss.  There are several  medications that increase the likelihood of hair loss, including her  beta blocker.  We have discussed this with her.  I have asked her to  have a TSH drawn today, and I will follow up with her about that.  One  other option would be to see if, as she is on the [employer] diet, if  she is taking a multivitamin that she did not report to me today, as  vitamin deficiencies could potentially augment any hair loss.  I will  follow up with the patient as soon as her TSH is available.      Shelby Dubin, PharmD, BCPS, CPP  Electronically Signed      Rollene Rotunda, MD, [facility]  Electronically Signed   MP/MedQ  DD: 03/21/2007  DT: 03/22/2007  Job  #: [id number]   cc:   Lonzo Cloud. Kriste Basque, MD

## 2010-09-22 NOTE — Assessment & Plan Note (Signed)
[facility]                               LIPID CLINIC NOTE   NAME:Samantha Bishop, Samantha Bishop                      MRN:          [id number]  DATE:09/04/2007                            DOB:          Apr 02, 1925    REFERRING PHYSICIAN:  Lonzo Cloud. Kriste Basque, MD   PATIENT'S CARDIOLOGIST:  Dr. Madolyn Frieze. [place]   [name] is a very pleasant patient well-known to me seen back  inLipid clinic further evaluation, medication titration associated with  her Lipitor 10 mg daily for her chronic hyperlipidemia.  She has been  compliant with her therapy.  She has had complaints of headache which  are being worked up as possible aneurysm versus neck spasm associated  and is being worked up by [employer].  She has been tolerating  the Lipitor without question or problem.  She has been following her  diet over the past 6 months and has had lost some weight and is feeling  well.  She is walking regularly and staying active.  She has not had any  muscles aches, pains, weakness, fatigue or other problems associated  with her therapy.   PAST MEDICAL HISTORY:  Pertinent for hyperlipidemia.   CURRENT MEDICATIONS:  1. Lipitor 10 mg daily at bedtime.  2. Aspirin 81 mg daily.  3. Aciphex 20 mg daily as needed.  4. Lopressor 25 mg twice daily.  5. Multivitamin daily.  6. Calcium  7. Vivelle patch.   PHYSICAL EXAMINATION:  Blood pressure today is 140/90.  Heart rate is  52.   LABORATORIES:  On August 28, 2007, reveal normal LFTs, a total  cholesterol 135, triglycerides 75, HDL 54.1, LDL is 66.   ASSESSMENT:  The patient is very pleasant and doing well on Lipitor 10.  She has good diet and exercise habits.  She has continued her work up  with [facility] associated with possible new  finding of aneurysm.  She will continue to proceed with this.  Questions  specifically regarding muscle spasm and its causative relationship with  Lipitor have been  discussed with the patient.  The patient will plan to  continue Lipitor 10 mg daily, aspirin 81 mg daily.  She will continue  walking regularly and following low-fat, low-cholesterol diet that has  at least 5 servings daily of fruits and vegetables.  She will follow up  in 6 months with lipid and  liver panel and continue on this therapy for now.  The patient was seen  by Lyna Poser PharmD resident.      Shelby Dubin, PharmD, BCPS, CPP  Electronically Signed      Rollene Rotunda, MD, [facility]  Electronically Signed   MP/MedQ  DD: 09/04/2007  DT: 09/04/2007  Job #: [id number]   cc:   Madolyn Frieze. Jens Som, MD, [facility]  Scott M. Kriste Basque, MD

## 2010-09-22 NOTE — Assessment & Plan Note (Signed)
[facility]                               LIPID CLINIC NOTE   NAME:Samantha Bishop, Samantha Bishop                      MRN:          [id number]  DATE:09/15/2006                            DOB:          12-20-1924    Patient is seen in the lipid clinic for further evaluation and  medication titration associated with her hyperlipidemia.  She has been  feeling and doing well overall.  She has had no muscle aches or pains,  weakness, fatigue, or other problems.  She has seen Dr. Gabriel Rung recently.  She states that she has been trying to do some exercise in small amounts  but has otherwise been living alone.   Patient's past medical history is pertinent for hyperlipidemia, for a  history of reentrant tachycardia requiring ablation, diverticulosis,  gastroesophageal reflux disease, history of DVT and PE, status post  arthroscopic knee surgery.   CURRENT MEDICATIONS:  1. AcipHex as needed.  2. Lipitor 10 mg daily.  3. Vivelle patch every 2 weeks.  4. Lopressor 25 mg twice daily.  5. Aspirin 81 mg daily.  6. Multivitamin daily.  7. Calcium daily.   REVIEW OF SYSTEMS:  As stated in the HPI, otherwise negative.   PHYSICAL EXAMINATION:  VITAL SIGNS:  Weight today in the office 188  pounds, blood pressure 132/74, heart rate 70.  Respirations are 16.   Labs on Sep 13, 2006 reveal normal LFTs.  Total cholesterol 157,  triglycerides 88, HDL 55.5, LDL 84.   ASSESSMENT:  The patient meets primary and secondary tertiary goals for  lipid-lower therapy.  At this time, patient will continue on these  therapies and follow up in six months.     Shelby Dubin, PharmD, BCPS, CPP  Electronically Signed      Rollene Rotunda, MD, [facility]  Electronically Signed   MP/MedQ  DD: 09/29/2006  DT: 09/29/2006  Job #: [id number]   cc:   Cecil Cranker, MD, [facility]

## 2010-09-22 NOTE — Assessment & Plan Note (Signed)
[facility]                               LIPID CLINIC NOTE   NAME:Samantha Bishop, Samantha Bishop                      MRN:          [id number]  DATE:03/11/2008                            DOB:          March 10, 1925    The patient is seen back in Lipid Clinic for further evaluation  medication titration associated with her chronic hyperlipidemia.  The  patient has been compliant with her Lipitor 10 mg each day without issue  or problem.  She eats lots of vegetables and only lean grilled meat.  She has worked since last visit to cut back on her sweet intake.  She is  walking twice a week for upto 1-1/2 hours each day.  She continues to  have problems with her legs, where she has had multiple DVTs and  varicosities in the past.  Currently, she has knots under the skin at  her ankle that burn and sting.  She continues to have some head pain in  her head and neck area, for which Dr. Kriste Basque has ordered an MRI.  She has  not smoked tobacco nor does she drinks alcohol on a regular basis.   PAST MEDICAL HISTORY:  Significant for hyperlipidemia, atrial  fibrillation, atrioventricular nodal-reentry tachycardia status post  ablation in July 2004.   CURRENT MEDICATIONS:  1. Lopressor 25 mg twice daily.  2. Lyrica 50 mg twice daily.  3. Aspirin 81 mg daily.  4. Zolpidem 5 mg daily at bedtime.  5. Lipitor 10 mg daily.   PHYSICAL EXAMINATION:  VITAL SIGNS:  Weight is 172 pounds, blood  pressure 128/68, and heart rate is 66.   LABORATORY DATA:  Labs on February 23, 2008 revealed normal LFTs.  Total  cholesterol 162, triglycerides 85, HDL 62.5, and LDL 83.   ASSESSMENT:  The patient meets primary and secondary tertiary goals for  lipid-lowering therapy at this time.  I have encouraged her to continue  on her current therapy.  I have asked her to call with questions or  problems in the meantime.  We will see her back in a year.  She will  notify us if she needs additional refills  or have questions in the  meantime.  I have congratulated her on her good work.  The patient was  seen with Mina Marble, PharmD.      Shelby Dubin, PharmD, BCPS, CPP  Electronically Signed      Rollene Rotunda, MD, [facility]  Electronically Signed   MP/MedQ  DD: 03/21/2008  DT: 03/21/2008  Job #: [id number]   cc:   Lonzo Cloud. Kriste Basque, MD

## 2010-09-25 NOTE — Consult Note (Signed)
NAME:  Samantha Bishop, Samantha Bishop                         ACCOUNT NO.:  [id number]   MEDICAL RECORD NO.:  [id number]                   PATIENT TYPE:  INP   LOCATION:  3302                                 FACILITY:  [facility]   PHYSICIAN:  Doylene Canning. Ladona Ridgel, M.D. [facility]           DATE OF BIRTH:  1925-04-07   DATE OF CONSULTATION:  08/27/2002  DATE OF DISCHARGE:                                   CONSULTATION   REFERRING PHYSICIAN:  Elana Alm. Thurston Hole, M.D.   REASON FOR CONSULTATION:  Evaluation of a intraoperative eight second pause.   HISTORY OF PRESENT ILLNESS:  The patient is a very pleasant 77 year old  woman with a long history of tachy palpitations and documented SVT in the  past.  This is associated with dizziness and near syncope.  The patient has  had the possibility of catheter ablation of her SVT discussed with my  partner, Duke Salvia, M.D., but because symptoms were not long in  duration, the patient has initially decided not to proceed with ablation.  She has never had any documented bradycardia.  She has a long history of  arthritis and is status post multiple knee surgeries and was recently  admitted to the hospital for right total knee replacement.  During her  surgery today, the patient was found to have an eight second pause which was  unfortunately not recorded on her monitor.  Her procedure continued and she  had no additional bradycardia by report.  Apparently this occurred  approximately 20 minutes after induction of general anesthesia.  She is now  referred for additional evaluation.   PAST MEDICAL HISTORY:  This is as previously noted.  In addition, she had a  pulmonary embolism following knee surgery in 1997.  She has a long history  of arthritis. She has no known coronary disease with preserved LV systolic  function.   FAMILY HISTORY:  Noncontributory.   PAST SURGICAL HISTORY:  This is notable for history of hemorrhoidectomy,  history of breast cyst removal, history of  T&A, history of arthroscopy and  appendectomy.   MEDICATIONS:  Pravachol, Lopressor, Zantac, Lasix, aspirin and Vioxx.   ALLERGIES:  She has history of allergy to PENICILLIN.   Recent Cardiolite stress test demonstrated normal perfusion and normal LV  systolic function.   HABITS:  No alcohol or tobacco use.   REVIEW OF SYSTEMS:  This is notable for pain in her knees and other joints.  History of recurrent and frequent palpitations which are typically short  lived.  There is a history of near syncope associated with palpitations.  The rest of her review of systems was negative.   PHYSICAL EXAMINATION:  GENERAL APPEARANCE:  She is a pleasant, well-  appearing, elderly woman in no distress.  VITAL SIGNS:  Blood pressure was 110/60, pulse was 90 and regular,  respiratory rate 22.  Temperature was 97.9.  HEENT:  Normocephalic and atraumatic.  Pupils  equal and round.  Oropharynx  moist. Sclerae are anicteric.  NECK:  No jugular venous distension.  There is no thyromegaly.  The carotids  are 2+ and symmetric.  LUNGS:  Clear to auscultation bilaterally.  There are no wheezing, rhonchi  or rales.  CARDIOVASCULAR:  There is a regular rate and rhythm with normal S1 and S2.  There are no murmurs, rubs, or gallops appreciated.  ABDOMEN:  Soft, nontender and nondistended.  EXTREMITIES:  The right leg is status post knee surgery with a bandage which  is moist with red tinged fluid.  The left leg is without edema.   Her EKG demonstrates normal sinus rhythm.   IMPRESSION:  1. Status post right total knee replacement.  2. Intraoperative eight second pause undocumented but noted by     anesthesiology.  3. Long history of supraventricular tachycardia treated with Lopressor in     the past.  4. History of arthritis.  5. Status post pulmonary embolism after left knee surgery.   RECOMMENDATIONS:  Hold her beta-blocker at present.  Will keep her on  telemetry throughout her hospital stay.  Will  plan to follow with you.  I  think catheter ablation at the present time is warranted but this could be  done as an outpatient unless she has incessant SVT which could not be  controlled with medications.                                               Doylene Canning. Ladona Ridgel, M.D. [facility]    GWT/MEDQ  D:  08/27/2002  T:  08/28/2002  Job:  [id number]   cc:   Duke Salvia, M.D.   Cecil Cranker, M.D. [facility]

## 2010-09-25 NOTE — Op Note (Signed)
NAME:  Samantha Bishop, Samantha Bishop NO.:  [id number]   MEDICAL RECORD NO.:  [id number]                   PATIENT TYPE:  OIB   LOCATION:  4703                                 FACILITY:  [facility]   PHYSICIAN:  Duke Salvia, M.D.               DATE OF BIRTH:  07/11/1924   DATE OF PROCEDURE:  11/16/2002  DATE OF DISCHARGE:                                 OPERATIVE REPORT   OPTICAL DISK:  224A   PREOPERATIVE DIAGNOSIS:  Supraventricular tachycardia.   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   PROCEDURE:  Invasive electrophysiologic study, arrhythmias mapping,  radiofrequency catheter ablation and drug infusion.   SURGEON:  Duke Salvia, M.D.   DESCRIPTION OF PROCEDURE:  Following obtaining informed consent, the patient  was brought to the electrophysiologic laboratory and placed on the  fluoroscopic table in the supine position.  After routine prep and drape,  cardiac catheterization was performed with local anesthesia and conscious  sedation.  Non-invasive blood pressure monitoring and transcutaneous oxygen  saturation monitoring were performed continuously throughout the procedure.  Following the procedure the catheters were removed, hemostasis was obtained  and the patient was transferred to the floor in stable condition.   Catheters:  A 5 French quadripolar catheter was inserted via the left  femoral artery to the AV junction.   A 5 French quadripolar catheter was inserted via the left femoral vein to  the high right atrium.   Next, a 5 [place] quadripolar was inserted via the left femoral vein to the  right ventricular apex.   A 6 French octapolar catheter was inserted via the right femoral vein to the  coronary sinus and then moved to the high right atrium/crista terminalis.   A 7 French, 4 mm deflectable catheter was inserted in mapping sites in the  posterior septal space in  the high right atrium.   RESULTS:  SURFACE ELECTROCARDIOGRAM AND INTRACARDIAC INTERVALS:  Sino (on Isoproterenol).  1. Rhythm is sinus initial and sinus final.  2. Cycle length is 882 milliseconds initial and 540 milliseconds final.  3. PR interval is 170 milliseconds initial and 194 milliseconds final.  4. QRS duration is 98 milliseconds initial and 94 milliseconds final.  5. QT interval is 422 milliseconds initial and 254 milliseconds final.  6. FICK QT interval is 354 milliseconds final.  7. T wave duration is 121 milliseconds initial and 122 milliseconds final.  8. Bundle branch block is absent.  9. Preexcitation is absent.  10.      AH interval is 86 milliseconds initial and 69 milliseconds final.  11.      HV interval is 47 milliseconds initial and 49 milliseconds final.  12.      His bundle duration is 13 milliseconds initial and 21 milliseconds     final.   AV NODAL FUNCTION:  1. The  AV Wenckebach cycle length in the presence of Isoproterenol is 450     milliseconds and the VA Wenckebach cycle length is 360 milliseconds.  2. AV conduction curves demonstrated discontinuity with echo beats and non-     sustained up to 8 beats of AV nodal reentrant tachycardia in the presence     of Isoproterenol.  Post ablation it was AV nodal conduction  and curves     were continuous.  3. Accessory pathway function:  No evidence of an accessory pathway was     identified.   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  In the presence of Isoproterenol it was non-sustained.  Following ablation an accessory pathway conduction was not seen.   An atrial tachycardia was also reproducibly induced.  It appeared initially  to be coming from the high right atrium and then was mapped to the mid  crista.  Following application of mapping catheters to this area, it became  non-inducible.  It was an __________ situation, however,  non-sustained,  limited to less than 20 beats or so.   Review of the telemetry strips suggested that at least one of her  tachycardias may in fact be this atrial tachycardia. Other tachycardias had  been demonstrated to be shorter or P tachycardias and I had anticipated that  these would represent AVRT given the H long set; however, no evidence of an  accessory pathway was identified.   SUMMARY AND CONCLUSION:  The results of the electrophysiologic testing  identified at least two tachycardias, in particular a third atrial  tachycardia that had a lower crista origin, but this may have been coming  from the catheter itself.  Given the difficulties in sustaining the atrial  tachycardia, it was mapped to the mid crista and then was no longer  inducible and it was then elected to not pursue it further at this time.  Slow pathway modification was undertaken with elimination of antegrade slow  pathway conduction in the substrates of that tachycardia mechanism.   In the event that the patient has recurrent palpitations, an event loop  recording will be useful and then ablation using electron and clinical  mapping would be my next approach.   The patient tolerated the procedure without apparent complications although  she has had problems with right leg pain throughout the procedure and in  fact preceding the procedure.                                               Duke Salvia, M.D.    SCK/MEDQ  D:  11/16/2002  T:  11/17/2002  Job:  [id number]

## 2010-09-25 NOTE — Assessment & Plan Note (Signed)
[facility]                              CARDIOLOGY OFFICE NOTE   NAME:Samantha Bishop, Samantha Bishop                      MRN:          [id number]  DATE:03/10/2006                            DOB:          Aug 22, 1924    REFERRING PHYSICIAN:  Jesse Sans. Wall, MD, [facility]   RETURN OFFICE VISIT FOR LIPID CLINIC   PAST MEDICAL HISTORY:  1. Hyperlipidemia.  2. Diverticulosis.  3. Gastroesophageal reflux disease.  4. History of AV nodal re-entry tachycardia, status post ablation.  5. History of deep venous thrombosis and pulmonary embolism after      arthroscopic knee surgery.   MEDICATIONS:  1. AcipHex 20 mg as needed.  2. Lipitor 5 mg daily.  3. Vivelle patches.  4. Metoprolol 25 mg twice daily.  5. Aspirin 81 mg daily.  6. Multivitamin daily.  7. Calcium daily.  8. Effexor XR 75 mg daily.   LABORATORY DATA:  Total cholesterol is 149, triglycerides 100, HDL 44, LDL  85. LFT's within normal limits.   VITAL SIGNS: Weight: 194 pounds. Blood pressure: 96/60. Heart rate: 80.   ASSESSMENT:  Samantha Bishop returns to Lipid Clinic today with no chest pain,  no shortness of breath, no muscle aches or pains. She says that she has done  very limited exercise secondary to leg pain. She is concerned of whether it  is peripheral vascular disease versus neuropathy. She does say that in the  past she was told she had some blockages in her lower extremities. I have  encouraged her to exercise in small amounts. When she goes to the grocery  store or [employer] or other shopping places to hold onto the shopping cart  and prior to beginning her shopping to make two laps around the store for a  small amount of exercise. Her diet is fairly heart healthy. She eats cereal  for breakfast and lunch usually has sandwiches or salads, lots of vegetables  and healthy evening meal with very little snacking on sweets and no fried  foods. Total cholesterol is at goal of less than 200,  triglycerides are at  goal of less than 150, HDL at goal of greater than 40 and LDL is at goal of  less than 100.   PLAN:  1. Continue current medication regimen.  2. Increase exercise by small increments.  3. Continue low-fat diet.  4. Follow up visit in six months for lipid panel and LFT's and make      adjustments all we need at that time.      Leota Sauers, PharmD  Electronically Signed      Jesse Sans. Daleen Squibb, MD, [facility]  Electronically Signed   LC/MedQ  DD: 03/10/2006  DT: 03/10/2006  Job #: [id number]

## 2010-09-25 NOTE — Consult Note (Signed)
NAME:  Samantha Bishop, Samantha Bishop               ACCOUNT NO.:  [id number]   MEDICAL RECORD NO.:  [id number]          PATIENT TYPE:  OUT   LOCATION:  XRAY                         FACILITY:  [facility]   PHYSICIAN:  Sanjeev K. Deveshwar, M.D.DATE OF BIRTH:  [birth year]   DATE OF CONSULTATION:  DATE OF DISCHARGE:  04/19/2008                                 CONSULTATION   CHIEF COMPLAINT:  Cerebral aneurysm.   HISTORY OF PRESENT ILLNESS:  This is a very pleasant 77 year old female  with a history of headaches.  She was evaluated by Dr. Marcelino Freestone.  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  It was  felt that the aneurysm was stable, and the patient did not need any  emergent treatment.  She later had a CT angiogram on Sep 08, 2007, that  showed no significant change in the aneurysm.  She also had a CT  angiogram on April 08, 2008, that again showed no significant change  in the aneurysm.   The patient has continued to have headaches which she describes as quite  severe at times, mainly in the back of her head.  She has been referred  to Dr. Corliss Skains to discuss possible treatment options for her cerebral  aneurysm.   PAST MEDICAL HISTORY:  Significant for hyperlipidemia, gastroesophageal  reflux disease.  She had a DVT and a pulmonary embolus following left  knee surgery in 1997.  She is status post radiofrequency ablation for a  nodal reentrant tachycardia, performed by Dr. Sherryl Manges in July 2004.  She has a history of diverticular disease.  She had Cardiolite in April  2001 as well as in April 2004 that showed no ischemia.  She had a 2-D  echo in the past which showed a normal ejection fraction.  She has a  history of degenerative joint disease.  She did have an 8-second pause  during her knee surgery back in 1997.  She has since been evaluated by  Cardiology.   Surgical history is significant for a  tonsillectomy, hemorrhoid surgery,  a surgery for a breast cyst.  She is status post appendectomy and status  post knee surgery.  She did have significant pauses in her heart rate  during her surgery for her knee.   ALLERGIES:  PENICILLIN caused induration at the site of the injection.   CURRENT MEDICATIONS:  The patient did not bring a list of her  medications, but she believes she is on Lipitor, Lopressor b.i.d., baby  aspirin 81 mg daily, vitamins, fish oil, and vitamin D.   SOCIAL HISTORY:  The patient is divorced.  She has a daughter and 2  sons.  She has a granddaughter who is in [employer].  The patient  lives alone in [place].  She has never smoked or used alcohol.  She  is retired from [employer].   FAMILY HISTORY:  Her mother died at age 49 from an MI.  Her father died  at age 64 from an MI.  To her knowledge, there  is no family history of  cerebral aneurysms.   IMPRESSION AND PLAN:  As noted, the patient presents today for further  evaluation of a cerebral aneurysm.  The patient had a little  understanding of the significance of a cerebral aneurysm.  This was  discussed in great detail.  The patient was also given some patient  education information to study at home.   Dr. Corliss Skains reviewed the results of the patient's imaging.  He pointed  out the area of the suspected aneurysm to the patient.  He felt  approximately 95% certain that this did represent an aneurysm, although  he did recommend a cerebral angiogram for a definitive diagnosis.   Treatment options were also discussed including conservative monitoring  versus open craniotomy and clipping versus endovascular treatment with  stenting and/or coiling.  The procedures were described in detail along  with the risks.  Again, Dr. Corliss Skains felt that it would be prudent to  first proceed with a cerebral angiogram.   Dr. Corliss Skains did tell the patient that he did not feel that her  aneurysms were causing her  headaches.  He felt that she should be  evaluated by a headache specialist.  It was also felt that she would  need a Cardiology consult if endovascular treatment was planned due to  the general anesthesia, which would be involved.  Greater than 1 hour  was spent on this consult.      Delton See, P.A.    ______________________________  Grandville Silos. Corliss Skains, M.D.    DR/MEDQ  D:  04/22/2008  T:  04/22/2008  Job:  [id number]   cc:   Lonzo Cloud. Kriste Basque, MD  Gustavus Messing Orlin Hilding, M.D.

## 2010-09-25 NOTE — Op Note (Signed)
NAME:  Samantha Bishop, Samantha Bishop                         ACCOUNT NO.:  [id number]   MEDICAL RECORD NO.:  [id number]                   PATIENT TYPE:  INP   LOCATION:  2899                                 FACILITY:  [facility]   PHYSICIAN:  Elana Alm. Thurston Hole, M.D.              DATE OF BIRTH:  10/17/1924   DATE OF PROCEDURE:  08/27/2002  DATE OF DISCHARGE:                                 OPERATIVE REPORT   PREOPERATIVE DIAGNOSIS:  Right knee degenerative joint disease.   POSTOPERATIVE DIAGNOSIS:  Right knee degenerative joint disease.   PROCEDURE:  Right total knee replacement using [employer] Scorpio total knee  system with #7 cemented femoral component, #7 cemented tibial component with  12 mm polyethylene flex tibial spacer and 28 mm polyethelene cemented  patella.   SURGEON:  Elana Alm. Thurston Hole, M.D.   ASSISTANT:  Julien Girt, P.A.   ANESTHESIA:  General.   OPERATIVE TIME:  1 hour 15 minutes.   COMPLICATIONS:  None.   DESCRIPTION OF PROCEDURE:  The patient was brought to the operating room on  August 27, 2002, and placed on the operative table in the supine position.  After an adequate level of general anesthesia was obtained, she received  vancomycin 1 g IV preoperatively for prophylaxis.  Her right knee was  examined under anesthesia.  Range of motion from 0-120 degrees with mild  varus deformity.  Knee stable with ligamentous exam with normal patella  tracking.  She had a Foley catheter placed under sterile conditions.  Her  right leg was then prepped using sterile Duraprep and draped using sterile  technique.  The leg was exsanguinated and a thigh tourniquet elevated to 375  mm.  Initially, through a 15 cm longitudinal incision based over the patella  initial exposure was made.  Underlying subcutaneous tissues were incised  along with skin incision.  A median arthrotomy was performed revealing an  excessive amount of normal appearing joint fluid.  Articular surfaces were  inspected.  She was found to have grade 4 changes medially, grade 3 changes  laterally and grade 3 changes in the patellar femoral joint.  Osteophytes  were removed from the femoral condyles and tibial plateau.  The medial and  lateral meniscal remnants were removed as well as the anterior cruciate  ligament.  Intermedullary drill was then drilled up the femoral canal for  placement of the distal femoral cutting jig which was placed in the  appropriate amount of rotation and the distal 10 mm cut was made.  The  distal femur was incised and #7 was found to be the appropriate size.  The  #7 cutting jig was placed and these cuts were made.  Proximal tibia was then  exposed.  The tibial spines were removed with an oscillating saw.  Each  medullary drill was drilled down the tibial canal for placement of the  proximal tibial cutting jig which was placed in  the appropriate amount of  rotation and a 10 mm proximal tibial cut was made.  After this was done, the  Scorpio PCL cutter was placed Samantha Bishop on the distal femur and these cuts were  made.  At this point, the #7 femoral trial was placed.  A #7 tibial base  plate trial was placed and with 12 mm polyethylene spacer, there was found  to be excellent restoration and normal alignment, excellent stability with  range of motion 0-125 degrees with no lift off on the tray.  The tibial base  plate was then marked for rotation and the keel cut was made.  After this  was done, the patellar was sized.  A 28 mm was found to be the appropriate  size and a recessed 10 mm x 28 mm cut was made and three locking holes were  placed.  After this was done, it was felt that all the trial components were  of excellent size, fit and stability.  They were then removed.  The knee was  then jet-lavage irrigated with 3 L of saline solution.  The proximal tibia  was then exposed.  The #7 tibial base plate with cement backing was then  hammered into position with an excellent  fit with excess cement being  removed from around the edges.  A #7 femoral component with cement backing  was hammered into position also with an excellent fit with excess cement  being removed from around the edges.  The 12 mm polyethylene spacer was the  locked on the tibial base plate.  The knee was taken through a range of  motion from 0-120 degrees with excellent stability.  A 28 mm patella with  cement backing was locked into its recess hole and held there with a clamp.  After the cement hardened, patella femoral tracking was evaluated and this  was found to be normal.  At this point, it was felt that all of the  components were of excellent size, fit and stability.  The tourniquet was  released.  Hemostasis was obtained with cautery.  The knee was further  irrigated with antibiotic solution.  The median arthrotomy was then closed  with #1 Ethibond suture over two medium Hemovac drains.  Subcutaneous tissue  was closed with 0 Vicryl and 2-0 Vicryl.  The skin was closed with skin  staples.  Sterile dressings were applied and a long leg splint.  The patient  then had a femoral nerve block placed by anesthesia for postoperative pain  control.  She was then awakened, extubated and taken to the recovery room in  stable condition.  Needle and sponge count was correct x2 at the end of the  case.                                               Robert A. Thurston Hole, M.D.    RAW/MEDQ  D:  08/27/2002  T:  08/27/2002  Job:  [id number]

## 2010-09-25 NOTE — Assessment & Plan Note (Signed)
[facility]                            CARDIOLOGY OFFICE NOTE   NAME:Redford, Samantha Bishop                      MRN:          [id number]  DATE:08/31/2006                            DOB:          1924-10-10    HISTORY:  The patient is a very pleasant 77 year old white female with  history of hyperlipidemia, pulmonary embolus, deep vein thrombosis, left  knee surgery in 1997.  She had RF ablation in July 2004 by Dr. Ladona Ridgel  because of nodal re-entrant tachycardia.  She has had no recent  palpitations, generally getting along well.  She has been followed in  lipid clinic by Shelby Dubin.  Her present problems are related to a  severe upper respiratory infection, probably viral.  She has seen Dr.  Kriste Basque for similar symptoms in the past and received steroids and  Levaquin.   The patient does not have a diagnosis of coronary disease.  She has had  some intermittent brief chest discomfort with lifting recently.   MEDICATIONS INCLUDE:  Aspirin 81, Lopressor 25 b.i.d., Vivelle patch  every 2 weeks, Lipitor 10, Aciphex.   PHYSICAL EXAMINATION:  VITAL SIGNS:  Blood pressure 150/80, pulse 79,  normal sinus rhythm.  GENERAL APPEARANCE:  Normal.  NECK:  JVP is not elevated.  Carotid pulses are palpable and equal  without bruits.  LUNGS:  Clear.  CARDIAC EXAM:  Normal.  EXTREMITIES:  Normal.   IMPRESSION:  1. Severe upper respiratory infection.  Refer to Dr. Kriste Basque.  2. Hypertension, possibly related to present illness.  3. Hyperlipidemia.  4. History of diverticulosis.  5. 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  6. History of deep vein thrombosis and pulmonary embolus after      arthroscopic knee surgery in the past.   PLAN:  We plan to get an adenosine Myoview.  I have referred her to Dr.  Kriste Basque for treatment of her respiratory symptoms.   If her Myoview is normal, I will have her continue the lipid clinic but  I have not given her  a followup appointment.  I will leave this up to  Dr. Kriste Basque.  We do plan to recheck her lipids and LFTs.  I should note  that her EKG is normal.    E. Graceann Congress, MD, [facility]  Electronically Signed   EJL/MedQ  DD: 08/31/2006  DT: 08/31/2006  Job #: [id number]

## 2010-09-25 NOTE — Discharge Summary (Signed)
NAME:  Samantha Bishop, Samantha Bishop                         ACCOUNT NO.:  [id number]   MEDICAL RECORD NO.:  [id number]                   PATIENT TYPE:  INP   LOCATION:  5031                                 FACILITY:  [facility]   PHYSICIAN:  Elana Alm. Thurston Hole, M.D.              DATE OF BIRTH:  08/10/1924   DATE OF ADMISSION:  08/27/2002  DATE OF DISCHARGE:  08/31/2002                                 DISCHARGE SUMMARY   ADMISSION DIAGNOSES:  1. End-stage degenerative joint disease, right knee.  2. History of deep vein thrombosis.  3. History of pulmonary embolus.  4. Coronary artery disease.  5. History of an irregular heart rate.  6. Hypertension.   DISCHARGE DIAGNOSES:  1. End-stage degenerative joint disease, right knee, status post total knee.  2. History of deep vein thrombosis.  3. History of pulmonary embolus.  4. Coronary artery disease.  5. Irregular heart rate.  6. Hypertension.   HISTORY OF PRESENT ILLNESS:  The patient is a 77 year old female with a long  history of bilateral knee DJD.  She has tried conservative treatment  including anti-inflammatories and cortisone injection without success.  At  this point in time she has pain at night, pain with rest, pain unrelieved by  anti-inflammatories.  She understands the risks, benefits, and possible  complications of a right total knee replacement and is without question.  She has received medical clearance from E. Graceann Congress, M.D., and [place]. Kriste Basque, M.D., for her total knee replacement.   PROCEDURES:  On 08/27/02 the patient underwent a right total knee replacement  by Dr. Thurston Hole.   HOSPITAL COURSE:  Intraoperatively she had two separate events of asystole  lasting from six to eight seconds.  She spontaneously returned to a regular  rhythm following both cases, and surgery proceeded without difficulty.  Postoperatively she was taken to the PACU, where cardiology was consulted to  follow her.  Postop day 0 she was placed in  stepdown unit for close  monitoring.  She had no arrhythmias overnight.  She was on a morphine PCA.  She tolerated her CPM well, 0-50 degrees.  Postop day 1 her hemoglobin was  9.4, white cell count was 9.1.  She was metabolically stable.  Her INR was  1.1.  T-max was 99.3.   She progressed very well in physical therapy, ambulating 120 feet.  Postop  day 2 T-max was 100.1.  Surgical wound was well-approximated.  Physical  therapy was more difficult on postop day 2.  She struggled with range of  motion and she struggled with her CPM with 0-30 on her CPM and had a  significant amount of nausea.  The night of postop day 2 she spiked a  temperature of 101.8.  Postop day 3 the patient was significantly improved  with physical therapy, ambulating 120 feet.  Surgical wounds were well-  approximated.  T-max was 100.6.  Postop day  4 the patient is in stable  condition, having no complaints.  Surgical wound is well-approximated.   DISPOSITION:  She is being discharged in stable condition to [facility], weightbearing as tolerated, on a regular diet.  She is to  follow up with Dr. Thurston Hole in a week and a half for staples out and x-rays  taken.  She needs to stay on Lovenox 30 mg subcu b.i.d. until her INR is  therapeutic between 2 and 3.  She is to get Coumadin daily.   OTHER DISCHARGE MEDICATIONS:  1. Percocet one to two q.4-6h. p.r.n. pain.  2. Colace 100 mg one p.o. b.i.d.  3. Senokot two tablets q.a.c. b.i.d.  4. Trinsicon one tablet t.i.d.  5. Pravachol 20 mg daily.  6. Zantac 150 mg b.i.d.  7. Lasix 20 mg daily.  8. Potassium 20 mEq p.o. daily.  9. Robaxin 500 mg one p.o. q.6h. p.r.n. muscle spasm.  10.      Phenergan 12.5-25 mg p.o./p.r. p.r.n. nausea.   DISCHARGE INSTRUCTIONS:  1. She is weightbearing as tolerated.  2. Will see her back in the office in 10 days.  3. Call with increased temperature, increased drainage, increased redness.  4. She is to be on a CPM eight  hours a day starting 0-60, increasing by 5-10     degrees a day, and to work on ambulation and range of motion of her knee,     she is to spend 30 minutes twice a day working on extension of her knee     with a pillow just underneath her heel.  She is never to have a pillow     underneath her knee for any reason.  5. Please call [phone] for an appointment for next Monday.     Kirstin Shepperson, P.A.                  Robert A. Thurston Hole, M.D.    KS/MEDQ  D:  08/31/2002  T:  08/31/2002  Job:  [id number]

## 2010-09-25 NOTE — Discharge Summary (Signed)
NAME:  Samantha Bishop, Samantha Bishop NO.:  [id number]   MEDICAL RECORD NO.:  [id number]                   PATIENT TYPE:  OIB   LOCATION:  4703                                 FACILITY:  [facility]   PHYSICIAN:  Duke Salvia, M.D.               DATE OF BIRTH:  [birth year]   DATE OF ADMISSION:  11/16/2002  DATE OF DISCHARGE:  11/17/2002                           DISCHARGE SUMMARY - REFERRING   BRIEF HISTORY:  Samantha Bishop is a pleasant 77 year old female with a long  history of tachy palpitations, which has been treated in the past with beta  blockers.  The patient underwent knee surgery and had an episode of tachy  arrhythmia as well as an 8-second pause.  Because of this, her beta blocker  was discontinued, and she was referred to Dr. Graciela Husbands for evaluation for  possible supraventricular tachycardia ablation.   PAST MEDICAL HISTORY:  Significant for pulmonary embolus following knee  surgery and apparently recurrent deep venous thrombosis.  She is on chronic  Coumadin therapy.  The patient had a Cardiolite scan in April 2001 that was  normal as well as a Cardiolite in April 2004 that was normal.  She also had  a normal echo.   FAMILY HISTORY:  Noncontributory.   PAST SURGICAL HISTORY:  1. The patient is status post hemorrhoidectomy.  2. History of a breast cyst removal.  3. History of tonsillectomy and adenoidectomy.  4. Arthroscopy.  5. Appendectomy.   MEDICATIONS PRIOR TO ADMISSION:  Included Ranitidine, Pravachol, Coumadin  and potassium.   ALLERGIES:  No known drug allergies.   HOSPITAL COURSE:  As noted, this patient was admitted to [facility]  by Dr. Graciela Husbands for recurrent supraventricular tachycardia, probably  longstanding AV reentrant tachycardia with probable atrial flutter 2:1, with  left bundle branch block with aberration.  She has normal LV function.  The patient underwent slow pathway modification with residual nonsustained  atrial  tachycardia performed by Dr. Graciela Husbands on November 16, 2002.  Her Lopressor  had previously been discontinued; this was resumed during her hospital stay  at 1/2 tablet twice daily.  She was also to be on low molecular weight  heparin until her Coumadin became therapeutic again.  Arrangements were made  to discharge the patient in stable and improved condition on November 17, 2002.   LABORATORY DATA:  A CBC on the day of discharge revealed hemoglobin 11.5,  hematocrit 33.9; WBC is 5.9, platelets 209,000.  Chemistry profile on July  09 revealed BUN 12, creatinine 0.6, potassium 4.2, glucose 102.  An INR on  July 09 was 1.4.  A PTT was 37.   DISCHARGE MEDICATIONS:  1. Iron capsule b.i.d.  2. Pravachol 20 mg daily.  3. Potassium chloride 20 mEq daily.  4. Zantac 150 mg p.r.n.  5. Lovenox 80 mg subcu q.12h.  6. Lopressor 1/2 tablet b.i.d.  7. Enteric-coated aspirin 81 mg daily.  8. Coumadin 4-mg tablets, 6 mg to be taken on Saturday, then 4 mg on Sunday.     She was to have a pro time blood test at the [facility] office on Monday,     July 12.  9. She was to be on antibiotics for any dental procedures, bowel procedures     or GYN procedures for the next 3 months.  10.      She was told to take Tylenol as needed for pain.   ACTIVITY:  The patient was told to avoid any strenuous activity for 4 days.  She is not to drive for 2 days.   DIET:  She is to be on a low-fat, low-cholesterol diet.   FOLLOW UP:  1. She was told to call if any problems developed.  2. She is to follow up with Dr. Graciela Husbands in approximately 6 weeks.   PROBLEM LIST AT THE TIME OF DISCHARGE:  1. History of supraventricular tachycardia, status post slow pathway     modification with residual nonsustained atrial tachycardia.  Please see     Dr. Odessa Fleming note for full details.  2. Hypertension, which is new, to be treated with Lopressor.  3. History of pulmonary embolus, on chronic Coumadin therapy.      Delton See, P.A. [facility]                   Duke Salvia, M.D.    DR/MEDQ  D:  11/17/2002  T:  11/18/2002  Job:  [id number]

## 2010-11-06 ENCOUNTER — Other Ambulatory Visit: Payer: Self-pay | Admitting: Internal Medicine

## 2010-12-31 ENCOUNTER — Encounter: Payer: Self-pay | Admitting: Internal Medicine

## 2010-12-31 ENCOUNTER — Ambulatory Visit (INDEPENDENT_AMBULATORY_CARE_PROVIDER_SITE_OTHER): Payer: Medicare Other | Admitting: Internal Medicine

## 2010-12-31 VITALS — BP 118/72 | HR 79 | Ht 65.0 in | Wt 177.0 lb

## 2010-12-31 DIAGNOSIS — R5381 Other malaise: Secondary | ICD-10-CM

## 2010-12-31 DIAGNOSIS — I4891 Unspecified atrial fibrillation: Secondary | ICD-10-CM

## 2010-12-31 DIAGNOSIS — R5383 Other fatigue: Secondary | ICD-10-CM

## 2010-12-31 NOTE — Progress Notes (Signed)
  HPI  Samantha Bishop is a 77 y.o. female  seen in followup for atrial fibrillation associated with symptomatic palpitations. She is a CHADS VASC score of 4 and is on Pradaxa. She is tolerating without symptoms. echo cardiogram 2010  demonstrated normal left ventricular function.    Her major concern last year was fungal infection of her nails. She was told by her podiatrist that this can be related to Lopressor. She requested permission from Dr. Kriste Basque to stop her Lopressor; this was denied. We did it anyway her dermatologist was not sure that it was the cause and did problem is some better.  Her major complaint now his fatigue and edema of her left leg. This he says is been in the last 6 or 7 months. This raises the possibility the weather is agreeable to the  Past Medical History  Diagnosis Date  . Unspecified essential hypertension   . Atrial fibrillation   . Tachycardia, unspecified   . Acute venous embolism and thrombosis of unspecified deep vessels of lower extremity   . Other and unspecified hyperlipidemia   . Esophageal reflux   . Diverticulosis of colon (without mention of hemorrhage)   . Benign neoplasm of colon   . Unspecified hemorrhoids without mention of complication   . Calculus of kidney   . Osteoarthrosis, unspecified whether generalized or localized, unspecified site   . Lumbago   . Cerebral aneurysm, nonruptured   . Anxiety state, unspecified   . Persistent disorder of initiating or maintaining sleep     Past Surgical History  Procedure Date  . Tonsillectomy and adenoidectomy   . Appendectomy   . Abdominal hysterectomy   . Knee surgery   . Breast cyst excision     Current Outpatient Prescriptions  Medication Sig Dispense Refill  . Cholecalciferol (VITAMIN D3) 1000 UNITS CAPS Take 2 tablets by mouth daily.        . dabigatran (PRADAXA) 150 MG CAPS Take 1 capsule (150 mg total) by mouth every 12 (twelve) hours.  180 capsule  3  . diltiazem (DILACOR XR) 120  MG 24 hr capsule Take 120 mg by mouth daily.        Marland Kitchen estradiol (VIVELLE-DOT) 0.05 MG/24HR Place 1 patch onto the skin 2 (two) times a week.        . mometasone (NASONEX) 50 MCG/ACT nasal spray 2 sprays by Nasal route at bedtime.  17 g  5  . simvastatin (ZOCOR) 20 MG tablet TAKE ONE TABLET BY MOUTH DAILY AT BEDTIME  30 tablet  9  . temazepam (RESTORIL) 15 MG capsule Take 15 mg by mouth at bedtime as needed.          Allergies  Allergen Reactions  . Codeine     REACTION: causes nausea  . Penicillins     REACTION: rash    Review of Systems negative except from HPI and PMH  Physical Exam Well developed and well nourished in no acute distress HENT normal E scleral and icterus clear Neck Supple JVP flat; carotids brisk and full Clear to ausculation irregular rate and rhythm, no murmurs gallops or rub Soft with active bowel sounds No clubbing cyanosis mild edema on the left; she is wearing a support stocking Alert and oriented, grossly normal motor and sensory function Skin Warm and Dry  ECG Atrial fibrillation at 79 Intervals-/0.08/0.39 Otherwise normal Assessment and  Plan

## 2010-12-31 NOTE — Assessment & Plan Note (Signed)
The concern is whether this is related to her diltiazem. We'll stop it for 4 weeks and see if it resolves.

## 2010-12-31 NOTE — Patient Instructions (Signed)
Your physician wants you to follow-up in: one year.  You will receive a reminder letter in the mail two months in advance. If you don't receive a letter, please call our office to schedule the follow-up appointment.   Your physician has recommended you make the following change in your medication: Hold your Diltiazem for 4 weeks and then call us and let us know how you are doing.

## 2010-12-31 NOTE — Assessment & Plan Note (Signed)
Permanent; tolerating Pradaxa. Rate control with diltiazem may be contributing to fatigue. We will laminated for 4 weeks and see. At the end of the last years issue with fungal infection, she is willing to take Lopressor in its stead

## 2011-01-04 ENCOUNTER — Telehealth: Payer: Self-pay | Admitting: Pulmonary Disease

## 2011-01-04 MED ORDER — AZITHROMYCIN 250 MG PO TABS: ORAL_TABLET | ORAL | Status: AC

## 2011-01-04 NOTE — Telephone Encounter (Signed)
Zpack #1 take as directed. No refills  mucinex As needed   Saline nasal  Fluids and rest  Please contact office for sooner follow up if symptoms do not improve or worsen or seek emergency care  Will need ov if not improving or worsens

## 2011-01-04 NOTE — Telephone Encounter (Signed)
Spoke with pt and she c/o cold x 5 days. Pt states she has a cough w/ yellow phlem. Pt states when she blows her nose she has white phlem coming out. Pt states this has been going on x 5 days. Pt would like something called in. Please advise Dr. Kriste Basque. Thanks  Allergies  Allergen Reactions  . Codeine     REACTION: causes nausea  . Penicillins     REACTION: rash   Carver Fila, CMA

## 2011-01-04 NOTE — Telephone Encounter (Signed)
Error.Samantha Bishop ° °

## 2011-01-04 NOTE — Telephone Encounter (Signed)
zpak sent to the pharmacy--pt aware med was sent and also advised of other recs

## 2011-01-13 ENCOUNTER — Telehealth: Payer: Self-pay | Admitting: Internal Medicine

## 2011-01-13 NOTE — Telephone Encounter (Signed)
Pt was taken off diltiazem, and since then heart fluttering during the night, now having some chest pain

## 2011-01-13 NOTE — Telephone Encounter (Signed)
I spoke with the patient. She states that since she stopped diltiazem about 2 weeks ago she has developed palpitations at night. She also had an episode of chest pain that lasted about 30 minutes today. She is not certain that this is not related to indigestion. Her diltiazem was stopped due to left foot edema and fatigue. She has noticed no change in her symptoms. Her palpitations are waking her up at night and she is quite concerned about this. I have explained that I would forward this message to Dr. Graciela Husbands and call her back with his recommendations. She is agreeable.

## 2011-01-13 NOTE — Telephone Encounter (Signed)
We had talked about retrying the lopressor if the dilt didn't do the trick  Lets do that at her previous dose thanks

## 2011-01-14 NOTE — Telephone Encounter (Signed)
LMTC

## 2011-01-14 NOTE — Telephone Encounter (Signed)
Pt returned your call.  She is at home, please call her back

## 2011-01-26 NOTE — Telephone Encounter (Signed)
I spoke with the patient. She is aware of Dr. Odessa Fleming recommendations to restart lopressor at her previous dose. She states she has this at home, but she is not sure of the dose. She remembered that it was BID. I have advised her that if restarts metoprolol and feels more fatigue to call and let us know. She voices understanding.

## 2011-01-30 ENCOUNTER — Emergency Department (HOSPITAL_COMMUNITY): Payer: Medicare Other

## 2011-01-30 ENCOUNTER — Inpatient Hospital Stay (HOSPITAL_COMMUNITY)
Admission: EM | Admit: 2011-01-30 | Discharge: 2011-02-02 | DRG: 103 | Disposition: A | Discharge status: Home / Self Care | Payer: Medicare Other | Source: Ambulatory Visit | Attending: Internal Medicine | Admitting: Internal Medicine

## 2011-01-30 DIAGNOSIS — Z96659 Presence of unspecified artificial knee joint: Secondary | ICD-10-CM

## 2011-01-30 DIAGNOSIS — E7800 Pure hypercholesterolemia, unspecified: Secondary | ICD-10-CM | POA: Diagnosis present

## 2011-01-30 DIAGNOSIS — G44209 Tension-type headache, unspecified, not intractable: Principal | ICD-10-CM | POA: Diagnosis present

## 2011-01-30 DIAGNOSIS — M542 Cervicalgia: Secondary | ICD-10-CM | POA: Diagnosis present

## 2011-01-30 DIAGNOSIS — Z86718 Personal history of other venous thrombosis and embolism: Secondary | ICD-10-CM

## 2011-01-30 DIAGNOSIS — Z86711 Personal history of pulmonary embolism: Secondary | ICD-10-CM

## 2011-01-30 DIAGNOSIS — K5900 Constipation, unspecified: Secondary | ICD-10-CM | POA: Diagnosis present

## 2011-01-30 DIAGNOSIS — Z79899 Other long term (current) drug therapy: Secondary | ICD-10-CM

## 2011-01-30 DIAGNOSIS — E785 Hyperlipidemia, unspecified: Secondary | ICD-10-CM | POA: Diagnosis present

## 2011-01-30 DIAGNOSIS — Z880 Allergy status to penicillin: Secondary | ICD-10-CM

## 2011-01-30 DIAGNOSIS — I4891 Unspecified atrial fibrillation: Secondary | ICD-10-CM | POA: Diagnosis present

## 2011-01-30 DIAGNOSIS — I720 Aneurysm of carotid artery: Secondary | ICD-10-CM | POA: Diagnosis present

## 2011-01-30 DIAGNOSIS — Z7901 Long term (current) use of anticoagulants: Secondary | ICD-10-CM

## 2011-01-30 LAB — BASIC METABOLIC PANEL
BUN: 13 mg/dL (ref 6–23)
CO2: 27 mEq/L (ref 19–32)
Calcium: 9.5 mg/dL (ref 8.4–10.5)
Chloride: 101 mEq/L (ref 96–112)
Creatinine, Ser: 0.52 mg/dL (ref 0.50–1.10)
GFR calc Af Amer: >60 mL/min (ref >60)
GFR calc non Af Amer: >60 mL/min (ref >60)
Glucose, Bld: 152 mg/dL — ABNORMAL HIGH (ref 70–99)
Potassium: 4 mEq/L (ref 3.5–5.1)
Sodium: 137 mEq/L (ref 135–145)

## 2011-01-30 LAB — CBC
HCT: 38.2 % (ref 36.0–46.0)
Hemoglobin: 12.9 g/dL (ref 12.0–15.0)
MCH: 30.1 pg (ref 26.0–34.0)
MCHC: 33.8 g/dL (ref 30.0–36.0)
MCV: 89 fL (ref 78.0–100.0)
Platelets: 185 10*3/uL (ref 150–400)
RBC: 4.29 MIL/uL (ref 3.87–5.11)
RDW: 13.5 % (ref 11.5–15.5)
WBC: 10.1 10*3/uL (ref 4.0–10.5)

## 2011-01-30 LAB — POCT I-STAT, CHEM 8
BUN: 14 mg/dL (ref 6–23)
Calcium, Ion: 1.22 mmol/L (ref 1.12–1.32)
Chloride: 103 mEq/L (ref 96–112)
Creatinine, Ser: 0.7 mg/dL (ref 0.50–1.10)
Glucose, Bld: 152 mg/dL — ABNORMAL HIGH (ref 70–99)
HCT: 42 % (ref 36.0–46.0)
Hemoglobin: 14.3 g/dL (ref 12.0–15.0)
Potassium: 4 mEq/L (ref 3.5–5.1)
Sodium: 138 mEq/L (ref 135–145)
TCO2: 24 mmol/L (ref 0–100)

## 2011-01-31 ENCOUNTER — Observation Stay (HOSPITAL_COMMUNITY): Payer: Medicare Other

## 2011-01-31 LAB — BASIC METABOLIC PANEL
BUN: 11 mg/dL (ref 6–23)
CO2: 28 mEq/L (ref 19–32)
Calcium: 9.4 mg/dL (ref 8.4–10.5)
Chloride: 103 mEq/L (ref 96–112)
Creatinine, Ser: 0.52 mg/dL (ref 0.50–1.10)
GFR calc Af Amer: >60 mL/min (ref >60)
GFR calc non Af Amer: >60 mL/min (ref >60)
Glucose, Bld: 132 mg/dL — ABNORMAL HIGH (ref 70–99)
Potassium: 4.6 mEq/L (ref 3.5–5.1)
Sodium: 140 mEq/L (ref 135–145)

## 2011-01-31 LAB — CBC
HCT: 37.6 % (ref 36.0–46.0)
Hemoglobin: 12.2 g/dL (ref 12.0–15.0)
MCH: 29.6 pg (ref 26.0–34.0)
MCHC: 32.4 g/dL (ref 30.0–36.0)
MCV: 91.3 fL (ref 78.0–100.0)
Platelets: 196 10*3/uL (ref 150–400)
RBC: 4.12 MIL/uL (ref 3.87–5.11)
RDW: 13.9 % (ref 11.5–15.5)
WBC: 7.3 10*3/uL (ref 4.0–10.5)

## 2011-01-31 LAB — TSH: TSH: 0.709 u[IU]/mL (ref 0.350–4.500)

## 2011-01-31 MED ORDER — IOHEXOL 350 MG/ML SOLN
50.0000 mL | Freq: Once | INTRAVENOUS | Status: AC | PRN
  Administered 2011-01-31: 50 mL via INTRAVENOUS

## 2011-02-07 NOTE — H&P (Signed)
NAME:  KINZI, FREDIANI NO.:  [id number]  MEDICAL RECORD NO.:  [id number]  LOCATION:  [facility]                         FACILITY:  [facility]  PHYSICIAN:  Della Goo, M.D. DATE OF BIRTH:  Aug 04, 1924  DATE OF ADMISSION:  01/30/2011 DATE OF DISCHARGE:                             HISTORY & PHYSICAL   PRIMARY CARE PHYSICIAN:  Lonzo Cloud. Kriste Basque, MD  CARDIOLOGIST:  Duke Salvia, MD, [facility]  CHIEF COMPLAINT:  Severe headache.  HISTORY OF PRESENT ILLNESS:  This is an 77 year old female with a known cerebral aneurysm diagnosed 3 years ago and chronic occipital headaches who states she has occipital neuralgia who presents to the emergency department with complaints of severe headache which started in the a.m. The headache she describes is her typical headache, but she reports this is the worse headache that she has ever had.  The pain is 10/10 described as being sharp.  Her scalp is even tender and the muscles in her neck.  She states the pain has been constant all day and the pain is in the back of her head and radiating into the front.  She reports having nausea and now, but feels that she is hungry.  She states she has not eaten all day.  She denies having any vomiting.  She denies having any fevers or chills, chest pain or shortness of breath, syncope or seizures.  The patient was evaluated in the emergency department and they were concerned the patient may have a subarachnoid hemorrhage.  A CT scan of the head was performed.  This was found to be negative so far.  However, results were discussed with both neurosurgery on-call and the radiologist and basically recommendations were made to monitor the patient for further changes and to repeat a CT scan in the a.m. and if this CT scan is negative then a lumbar puncture would be performed under fluoroscopy to rule out a subarachnoid bleed.  So, the patient was referred to the hospitalist service for admission for  observation status.  The complicating factor is that the patient is on Pradaxa therapy for her atrial fibrillation and history of pulmonary embolism and the half life of this medication is 150 days with a normal creatinine clearance.  Her Pradaxa therapy will have to be discontinued before lumbar puncture or procedure can be performed.  PAST MEDICAL HISTORY:  History of cerebral aneurysm, history of atrial fibrillation on Pradaxa therapy, pulmonary embolism on Pradaxa therapy, dyslipidemia, and history of deep venous thrombosis.  PAST SURGICAL HISTORY:  History of a right total knee replacement that had complications where she coded twice during the procedure.  This was felt to be due to the anesthesia.  She also had a small-bowel obstruction in the past, status post colon resection and right ovarian removal then subsequent hysterectomy with remaining ovary removed as well.  MEDICATIONS AT THIS TIME:  Vivelle, diltiazem, Pradaxa and simvastatin.  SOCIAL HISTORY:  The patient is a nonsmoker, nondrinker.  No history of illicit drug usage.  FAMILY HISTORY:  Negative for cerebrovascular accidents and negative for aneurysms in her family.  PHYSICAL EXAMINATION FINDINGS:  GENERAL:  This is an 77 year old well- nourished, well-developed elderly Caucasian  female who is in discomfort, but no acute distress. VITAL SIGNS:  Her temperature is 97.4, blood pressure 162/86, heart rate 110, respirations 24 and on arrival O2 saturations 99%. HEENT:  Normocephalic, atraumatic.  Pupils equally round, reactive to light.  Extraocular movements are intact.  Funduscopic benign.  There is no photophobia.  No scleral icterus.  Nares are patent bilaterally. Oropharynx is clear. NECK:  Supple.  Full range of motion.  No thyromegaly, adenopathy or jugular venous distention. CARDIOVASCULAR:  Mild tachycardia initially now normal sinus rhythm. Normal S1-S2.  No murmurs, gallops or rubs appreciated. LUNGS:   Clear to auscultation bilaterally.  No rales, rhonchi or wheezes. ABDOMEN:  Positive bowel sounds, soft, nontender and nondistended.  No hepatosplenomegaly. EXTREMITIES:  Without cyanosis, clubbing or edema. NEUROLOGIC:  The patient is alert and oriented x3.  Her speech is clear. Cranial nerves are intact.  There are no focal deficits on examination.  LABORATORY STUDIES:  White blood cell count 10.1, hemoglobin 12.9, hematocrit 38.2, MCV 89.0 and platelets 185.  Sodium 138, potassium 4.0, chloride 103, CO2 24, BUN 14, creatinine 0.70, glucose 152.  CT scan of the head as mentioned above negative for any evidence of acute hemorrhage, mass or edema at this time.  Atrophy and chronic ischemic changes are seen.  ASSESSMENT: 1. Am 77 year old female being admitted with 23-hour observation     secondary to severe intractable occipital headache. 2. Cerebral aneurysm. 3. Elevated blood pressure with no official history of hypertension. 4. History of atrial fibrillation and deep venous thrombosis and     pulmonary embolism on Pradaxa therapy. 5. Hyperlipidemia.  PLAN:  The patient will be admitted to 23-hour observation.  Pain medication will be ordered for a headache along with muscle relaxer therapy.  P.r.n. medication for her elevated blood pressures will be ordered and her regular medications will be further reconciled. However, the Pradaxa therapy will be held at this time.  She will be placed in SCDs for DVT prophylaxis.  Her repeat CT scan of the brain will be ordered with contrast in the a.m. to evaluate for hemorrhage. An Radiology will be consulted to evaluate for fluoroscopic LP once the Pradaxa therapy has been cleared from her system.  At this time, her BUN and creatinine clearance are normal.  The patient is a full code. Further workup will ensue pending results of the patient's clinical course and symptoms.     Della Goo, M.D.     HJ/MEDQ  D:  01/30/2011  T:   01/30/2011  Job:  [id number]  Electronically Signed by Della Goo M.D. on 02/07/2011 10:19:50 PM

## 2011-02-10 ENCOUNTER — Encounter: Payer: Self-pay | Admitting: Pulmonary Disease

## 2011-02-10 ENCOUNTER — Ambulatory Visit (INDEPENDENT_AMBULATORY_CARE_PROVIDER_SITE_OTHER): Payer: Medicare Other | Admitting: Pulmonary Disease

## 2011-02-10 DIAGNOSIS — F411 Generalized anxiety disorder: Secondary | ICD-10-CM

## 2011-02-10 DIAGNOSIS — R51 Headache: Secondary | ICD-10-CM

## 2011-02-10 DIAGNOSIS — G4700 Insomnia, unspecified: Secondary | ICD-10-CM

## 2011-02-10 DIAGNOSIS — I87309 Chronic venous hypertension (idiopathic) without complications of unspecified lower extremity: Secondary | ICD-10-CM

## 2011-02-10 DIAGNOSIS — M1990 Unspecified osteoarthritis, unspecified site: Secondary | ICD-10-CM

## 2011-02-10 DIAGNOSIS — E785 Hyperlipidemia, unspecified: Secondary | ICD-10-CM

## 2011-02-10 DIAGNOSIS — D126 Benign neoplasm of colon, unspecified: Secondary | ICD-10-CM

## 2011-02-10 DIAGNOSIS — I10 Essential (primary) hypertension: Secondary | ICD-10-CM

## 2011-02-10 DIAGNOSIS — I4891 Unspecified atrial fibrillation: Secondary | ICD-10-CM

## 2011-02-10 DIAGNOSIS — M545 Low back pain, unspecified: Secondary | ICD-10-CM

## 2011-02-10 DIAGNOSIS — I671 Cerebral aneurysm, nonruptured: Secondary | ICD-10-CM

## 2011-02-10 DIAGNOSIS — K219 Gastro-esophageal reflux disease without esophagitis: Secondary | ICD-10-CM

## 2011-02-10 DIAGNOSIS — K5730 Diverticulosis of large intestine without perforation or abscess without bleeding: Secondary | ICD-10-CM

## 2011-02-10 DIAGNOSIS — M542 Cervicalgia: Secondary | ICD-10-CM

## 2011-02-10 MED ORDER — ANTIPYRINE-BENZOCAINE 5.4-1.4 % OT SOLN: 3.0000 [drp] | OTIC | Status: AC | PRN

## 2011-02-10 NOTE — Patient Instructions (Signed)
Today we updated your med list in EPIC...    We wrote a new prescription for AURALGAN ear drops- place 2 drops in left ear up to 3 times daily as needed for pain in the ear.  We will arrange for a consultation at [facility] here in [place]...  Call for any questions...  Let's plan a follow up appt in 4-6 months w/ Fasting blood work at that time.Marland KitchenMarland Kitchen

## 2011-02-18 ENCOUNTER — Ambulatory Visit: Payer: Medicare Other | Admitting: Pulmonary Disease

## 2011-02-21 ENCOUNTER — Encounter: Payer: Self-pay | Admitting: Pulmonary Disease

## 2011-02-21 NOTE — Progress Notes (Signed)
Subjective:    Patient ID: Samantha Bishop, female    DOB: [birth year], 77 y.o.   MRN: [id number]  HPI 77 y/o WF here for a follow up visit... She has mult medical problems including:  HBP;  Hx PAT- s/p ablation2003;  PAF on Pradaxa;  HxDVT-resolved;  Hx recurrent Cellulitis right ankle;  Hyperchol;  GERD/ Gastirits;  Divertics/ Colon polyps;  Hx Kidney stones;  DJD w/ right TKR;  LBP;  Osteopenia;  NeckPain & HAs;  Intracranial Aneurysm;  Anxiety & chronic persistant insomnia...  ~  February 18, 2010:  She had f/u for her DJD & cellulitis by DrWainer & DrFitzgerald 5/11- he has kept her on suppressive Keflex 500mg /d, & compression hose (he suggested stopping the Keflex after 57mo to see if she had any recurrence)...  she has onychomycosis & DrTuckman wanted to use Lamisil, & when DrKlein saw her for Cards f/u 8/11 (AFib & palpit)- he changed Lopressor to CardizemCD 120mg /d... she now requests a perscription for Fluocinolone acetonide topical solution 0.01% stating this worked well for her daughter...    Her CC today is chronic daily HAs for which she had an eval in [place] including shots in her neck etc... she was then [place]Ra[place] further treatment but he needed OK from DrKlein to hold the Pradaxa before any further interventions> OK to hold Pradaxa for 5d prior & she is waiting for f/u appt w/ DrRauch...  ~  August 20, 2010:  57mo ROV & review- see prob list below, she is off the prev Keflex suppressive Rx w/o obvious recurrence of her LE cellulitis; she is not fasting today & doesn't want lab work at this time, she will be due for f/u CTA in about 57mo...  ~  February 10, 2011:  57mo ROV - pt did not bring her meds to the OV today... C/o persisitent HAs & left ear pain (exam clear- given Auralgan)... She was HOSP 9/12 by [facility] w/ HA (see below)...    HBP> Dr Graciela Husbands substituted Lopressor 25mg Bid for her Diltiazem; his note of 8/12 is reviewed; BP= 140/90 but she feels better & doesn't  want to switch back; c/o HAs but denies CP, palpit, SOB, or edema...    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    Hx of DVT/ Cellulitis> she saw DrGreenberg ([facility]) who planned both endovenous chemical & laser ablation (his noted from 7/12 is reviewed)...    Hyperlipid> on Simva20 per [facility]; last FLP 5/12 reviewed & looked good on this med + diet etc...    GI> GERD, Divertics, Polyps> she uses prn PPI vs H2blockers & denies upper or lower GI symptoms; she does not want repeat colon given her age & Pradaxa rx...    DJD/ LBP> followed by DrWainer w/ right TKR 2004; she takes OTC analgesics prn...    Neck Pain & Chronic HAs> she was HOSP 9/22-25/12 by [facility] w/ severe HA, felt to be cervicogenic & tension related; she actually complains of chr daily HAs x yrs- this was the worst one yet, she says & precipitated by loud hi-pitched gospel singing; eval included CTBrain w/ atrophy, sm vessel dis, & right parietal cortical infarct (no hemorrhage); CTAngio showed chr infarcts bilat, 4x81mm aneurysm in left cavernous carotid is unchanged from 4/11;  She states she was treated w/ Pred  but she refused "it bloated me" (not mentioned in DCSummary); rec to take phys therapy but she declined "it didn't help in the past"; Given Robaxin & Percocet but she hasn't taken any since disch;  She has persistant cervicogenic headaches, known CSpine arthritis, & disappointed in DrRauck's treatment; rec referral to another pain clinic for management...    Intracranial aneurysm> she had f/u CTAngio during the 9/12 admission for HA & the left cavernous carotid aneurysm is stable at 4x41mm (no change from 4/11)...    Anxiety> she was given some Valium in the hosp 9/12 but not taking any anxiolytic med as outpt, just Restoril 15mg  prn sleep...       Problem  List:  SINUSITIS, ACUTE (ICD-461.9) - she uses Nasal Saline Mist & FLONASE as needed... Prev eval DrRosen for ENT w/ deviated septum found, otherw neg exam, but it's hard for her to breathe thru her nose... ~  9/12: c/o left ear pain; neg exam- given Auralgan drops for prn use...  HYPERTENSION (ICD-401.9) - controlled on LOPRESSOR 50mg - 1/2 Bid now... ~  Baseline EKG= NSR, WNL... ~  2DEcho 1996 showed mild LVH, norm valves, EF=60%... ~  NuclearStressTest 4/04 was neg- no infarct or ischemia, EF=69%... similiar to 1999 study. ~  2DEcho 10/10 showed norm LV w/ EF=55-65%, mild AoV thickening, triv MR, LA=63mm... ~  8/12:  DrKlein switched her Diltiazem120 to Lopressor25Bid but to her c/o fatigue...  Hx of TACHYARRHYTHMIA (PAT) & ATRIAL FIBRILLATION> ~  hx atrial tachycardia and AV nodal reentry in 2003... s/p ablation, no recurrent symptoms...  ~  referred to [facility] 10/10 w/ incr palpit & Holter Monitor showed PAF; DrKlein started PRADAXA 150mg Bid. ~  8/12:  DrKlein switched her Diltiazem120 to Lopressor25Bid but to her c/o fatigue...  Hx of DEEP VENOUS THROMBOPHLEBITIS (ICD-453.40)  Hx of CELLULITIS, ANKLE (ICD-682.6) - she had a DVT and a small PE after knee surgery in 1997... subseq eval of her veins by the vasc surgeons showed venous stasis disease...  developed cellulitis & ?of superficial thrombophlebitis 12/10- hosp transiently & VenDopplers neg for DVT, treated w/ BacterimDS, Naprosyn, Percocet & initially improved...  ~  1/11:  DrWainer sent her to DrFitzgerald for recurrent cellulitis RLE & he started prolonged course of Keflex & local wraps... ~  10/11:  She remains on suppressive therapy w/ Keflex 500mg /d from DrFitzgerald. ~  4/12:  She reports stopping the Keflex on her own ~1/12 & states no recurrent prob so far, doing OK. ~  7/12:  Note from DrGreenberg, [facility]- plans endovenous chemical & laser ablation procedures...  HYPERLIPIDEMIA (ICD-272.4) - prev on Lipitor10 +  FishOil 2/d... Lipid clinic ch to SIMVASTATIN for $$ reasons. ~  [facility] Clinic f/u 11/10 on Lip10 + FishOil 2/d showed TChol 143, TG 72, HDL 59, LDL 70 ~  FLP 4/11 on Simva20 showed TChol 130, TG 54, HDL 64, LDL 55 ~  FLP 10/11 on Simva20 showed TChol 122, TG 51, HDL 59, LDL 53 ~  ?Lipid Clinic decr her Simva20 to 10mg /d due to Diltiazem rx... ~  FLP 5/12 on Simva10 showed TChol 163, TG 94, HDL 64, LDL 81   GERD (ICD-530.81) - last EGD was 3/06 showing chr gastritis & duodenitis... prev on Aciphex20, now Prn Pepcid vs Prilosec...  DIVERTICULOSIS OF COLON (ICD-562.10)   COLONIC POLYPS (ICD-211.3) - last colonoscopy was 1/07 by DrSam showing divertics only... f/u planned 28yrs... (last polyp was 1999 tiny & cauterized). ~  1/11:  c/o bowel irregularity & some blood  seen after hosp for cellulitis- Rx w/ Align/ Activia/ Proctocort cream. ~  3/12:  Now 77 y/o & she doesn't want repeat colonoscopy while on Pradaxa...  Hx of RENAL CALCULUS (ICD-592.0) - stones seen in kidney on CTAbd 2007, non-obstructing & no hx of symptomatic nephrolithiasis.  DEGENERATIVE JOINT DISEASE (ICD-715.90) - prev hx of arthroscopy w/ post-op DVT and PTE in 1995... she had a right TKR by DrWainer 4/04> apparently she had arrhythmia post op "heart stopped for 18sec & DrWainer said I can't be put to sleep no more"  LOW BACK PAIN SYNDROME (ICD-724.2) - she has had LBP evauated by Ortho- DrWainer, DrGioffre; and by Neurosurg- DrJenkins in 2003... MRI showed a disc at L3-4 w/ mild spinal stenosis, but he did not rec surg...   OSTEOPENIA (ICD-733.90) - BMD followed by DrLomax, Gyn> -1.6 in [facility]... on Vivelle, Calcium, VitD.  NECK PAIN & HEADACHE - Dx w/ occip neuralgia & cervical degen disc dis w/ prev eval DrWeymann in 2009 & another eval at [facility] 7/10- treated w/ bilat occip nerve blocks 9/10 & improved... refered to DrRauck to continue this therapy closer to home. ~  4/12:  She reports that DrRauck tried shots for her  cervicogenic HAs but they didn't help, she now uses OTC analgesics as needed. ~  9/12:  HOSP by [facility] w/ HA-  felt to be cervicogenic & tension related; she actually complains of chr daily HAs x yrs- this was the worst one yet; eval included CTBrain w/ atrophy, sm vessel dis, & right parietal cortical infarct (no hemorrhage); CTAngio showed chr infarcts bilat, 4x45mm aneurysm in left cavernous carotid is unchanged from 4/11;  She states she was treated w/ Pred but she refused "it bloated me" (not mentioned in DCSummary); rec to take phys therapy but she declined "it didn't help in the past"; Given Robaxin & Percocet but she hasn't taken any since disch;  She has persistant cervicogenic headaches, known CSpine arthritis, & disappointed in DrRauck's prev treatment; rec referral to another pain clinic for management...  INTRACRANIAL ANEURYSM (ICD-437.3) - MRI/ MRA done 3/09 for HA eval revealed atophy, sm vessel dis, and an incidental 5mm aneurysm projecting posteriorly from the left ICA (cavernous segment)...  we contacted her relatives at [facility] and sent her scan and notes to Dr. Annamary Rummage at the [facility] [phone] [id number] [place] [address]... they requested that we perform a CT Angio- done 09/08/07 and showed a 5mm aneurysm projecting posteriorly from the junction of the cavernous and supraclinoid segments of the left ICA...   ~  f/u CTAngio 04/08/08 showed no significant change- she tells me that if she needs surg she wants to go to [facility] to see [facility] doctor...  ~  CDopplers 2010 showed calcif plaque in ICA's 0-39% stenoses. ~  f/u CTAngio 4/11 showed stable left cavernous ICA aneurysm measuring 4 x 5mm. ~  f/u CTAngio 9/12 showed chr infarcts bilat, 4x71mm aneurysm in left cavernous carotid is unchanged from 4/11...  ANXIETY (ICD-300.00) & INSOMNIA, CHRONIC (ICD-307.42) - she notes that Ambien 10mg  w/o help and wants something stronger- try RESTORIL 15mg  Qhs  Prn...  GYN = DrLomax & seen 5/11... on VIVELLE & Ca++/ VitD... he did BMD in 10/07 w/ osteopenia, & f/u 5/11 showed min deterioration w/ TScores -0.8 in spine, and -1.6 in [facility]...   Past Surgical History  Procedure Date  . Tonsillectomy and adenoidectomy   . Appendectomy   . Abdominal hysterectomy   .  Knee surgery   . Breast cyst excision     Outpatient Encounter Prescriptions as of 02/10/2011  Medication Sig Dispense Refill  . Cholecalciferol (VITAMIN D3) 1000 UNITS CAPS Take 2 tablets by mouth daily.        . dabigatran (PRADAXA) 150 MG CAPS Take 1 capsule (150 mg total) by mouth every 12 (twelve) hours.  180 capsule  3  . estradiol (VIVELLE-DOT) 0.05 MG/24HR Place 1 patch onto the skin 2 (two) times a week.        . metoprolol (LOPRESSOR) 50 MG tablet Take 1/2 tablet by mouth two times daily      . mometasone (NASONEX) 50 MCG/ACT nasal spray 2 sprays by Nasal route at bedtime.  17 g  5  . simvastatin (ZOCOR) 20 MG tablet TAKE ONE TABLET BY MOUTH DAILY AT BEDTIME  30 tablet  9  . temazepam (RESTORIL) 15 MG capsule Take 15 mg by mouth at bedtime as needed.        Marland Kitchen antipyrine-benzocaine (AURALGAN) otic solution Place 3 drops into the left ear every 2 (two) hours as needed for pain.  10 mL  1    Allergies  Allergen Reactions  . Codeine     REACTION: causes nausea  . Penicillins     REACTION: rash    Review of Systems        See HPI - all other systems neg except as noted...      The patient complains of dyspnea on exertion and sl peripheral edema.  The patient denies anorexia, fever, weight loss, weight gain, vision loss, decreased hearing, hoarseness, chest pain, syncope, prolonged cough, headaches, hemoptysis, abdominal pain, melena, hematochezia, severe indigestion/heartburn, hematuria, incontinence, muscle weakness, suspicious skin lesions, transient blindness, difficulty walking, depression, unusual weight change, abnormal bleeding, enlarged lymph nodes, and angioedema.      Objective:   Physical Exam     WD, WN, 77 y/o WF in NAD... GENERAL:  Alert & oriented; pleasant & cooperative... HEENT:  [place]/AT, EOM-wnl, PERRLA, EACs-clear, TMs-wnl, NOSE- sl red, THROAT- no exud seen... NECK:  Supple w/ decrROM; no JVD; normal carotid impulses w/o bruits; no thyromegaly or nodules palpated; no lymphadenopathy. CHEST:  Clear to P & A; without wheezes/ rales/ or rhonchi. HEART:  Regular Rhythm; msc gr 1/6 SEM without rubs or gallops heard... ABDOMEN:  Soft & nontender; normal bowel sounds; no organomegaly or masses detected. EXT: without deformities, mod arthritic changes; no varicose veins/ +venous insuffic/ tr edema. Right lower leg wrapped, support hose, less discomfort... NEURO:  CN's intact;  no focal neuro deficit... DERM:  No lesions noted; no rash etc... just chr ven insuffic changes in LE's...   Assessment & Plan:   HBP>  Controlled on the Lopressor + her diet etc;  Continue same...  PAF>  Followed by DrKlein & stable on the Pradaxa & now Lopressor for rate control;  She notes rare epis of palpit but they are min, brief, & self-limited...  CELLULITIS>  She stopped the Keflex suppressive therapy & continues to do well w/o recurrent infection, pain, swelling, etc; she is followed by [facility]Greenberg [facility] & getting endovenous therapy...  CHOL>  Followed in the [facility] & stable on Simva10 w/ good FLP 5/12...  GI>  Stable on prn meds and she is not inclined to pursue another colonoscopy...  HEADACHES>  She continues to have freq ?daily? HAs and she is rec to pursue eval &Rx at a HA/ Pain clinic...  ANEURYSM>  Clinically stable w/o vasc  or cerebral ischemic symptoms;  CTA 9/12 reviewed...  ANXIETY>  She uses Restoril for sleep.Marland KitchenMarland Kitchen

## 2011-02-22 NOTE — Discharge Summary (Signed)
NAME:  Samantha Bishop, Samantha Bishop               ACCOUNT NO.:  [id number]  MEDICAL RECORD NO.:  [id number]  LOCATION:  3032                         FACILITY:  [facility]  PHYSICIAN:  Clydia Llano, MD       DATE OF BIRTH:  [birth year]  DATE OF ADMISSION:  01/30/2011 DATE OF DISCHARGE:  02/02/2011                              DISCHARGE SUMMARY   PRIMARY CARE PHYSICIAN:  Lonzo Cloud. Kriste Basque, MD  REASON FOR ADMISSION:  Severe headache, rule out subarachnoid hemorrhage.  DISCHARGE DIAGNOSES: 1. Tension headaches. 2. Cervicalgia. 3. Left cavernous carotid aneurysm measures 4.5 unruptured. 4. Atrial fibrillation, rate controlled. 5. Hyperlipidemia.  DISCHARGE MEDICATIONS: 1. Robaxin 500 mg every 8 hours as needed for spasm. 2. Percocet 5/325 mg every 8 hours as needed for pain. 3. Diltiazem CD 120 mg 1 capsule p.o. daily. 4. Estrace 0.05 mg per day patch topically twice a week. 5. Pradaxa 150 mg p.o. b.i.d. 6. Simvastatin 20 mg p.o. at bedtime.  BRIEF HISTORY AND EXAMINATION:  Samantha Bishop is an 77 year old female with past medical history of known cerebral aneurysm and chronic occipital headache.  The patient came into the hospital complaining about sharp pain 10/10 in the back of her neck and she said even the scalp feels tender and the muscle and her neck as well.  She stated that the pain is constant all the time.  The patient mentioned she had neck pain before and she did have steroid injection for it.  Upon initial evaluation in the emergency department because the patient did have history of aneurysm a concern about subarachnoid hemorrhage was raised. CT scan of the head was performed and was negative.  The patient was admitted to the hospital for further evaluation after the Neurosurgery on-call was called and the recommendation to repeat CT scan in the morning.  RADIOLOGY:  CTA of the head showed no evidence of subarachnoid hemorrhage.  On enhanced CT, there are chronic ischemic changes.   No acute infarct with 4 x 5 mm aneurysm of the left cavernous carotid is unchanged from the prior CT.  There is mild fusiform dilatation of the right anterior cerebral artery, unchanged from the prior study.  CT head showed atrophy and chronic changes negative for hemorrhage.  BRIEF HOSPITAL COURSE: 1. Severe headache, rule out subarachnoid hemorrhage.  The patient     admitted to the hospital as mentioned above.  The initial CT scan     was negative.  Neurosurgery recommended to repeat CT in the     morning.  CT angiography was repeated in the morning, which was     negative.  The patient was complaining about 10/10 pain.  The pain     when examined her seems to me like occipital and lower calf tension     in the nuchal area.  I started her on pain medications as well as a     muscle relaxant.  I started Robaxin on her as well as Valium.  I     ordered physical therapy for evaluation of C-spine.  The patient     mentioned she did have trouble before with her C-spine and she did  receive steroid injection in her neck.  Physical Therapy     recommended outpatient therapy, which we will prescribe.  The pain     was controlled now.  The patient will go home on Robaxin and     Percocet as needed. 2. Cervicalgia.  Please see the above plan.  Outpatient physical     therapy, muscle relaxant, and pain medications.  If this is     persistent, the patient might need referral for MRI or spinal     surgeon to evaluate her. 3. Atrial fibrillation.  The patient is on Pradaxa, rate controlled     with diltiazem.  Pradaxa was withheld at the time of admission and     when the 2 CT scans were negative for bleeding it was restarted.     No issues during this stay. 4. Hyperlipidemia.  Continue her home medications.  DISCHARGE INSTRUCTIONS: 1. Activity as tolerated. 2. Disposition home with outpatient physical therapy. 3. Diet heart-healthy diet.     Clydia Llano, MD     ME/MEDQ  D:   02/02/2011  T:  02/02/2011  Job:  [id number]  cc:   Lonzo Cloud. Kriste Basque, MD  Electronically Signed by Clydia Llano  on 02/22/2011 01:39:45 PM

## 2011-02-26 ENCOUNTER — Telehealth: Payer: Self-pay | Admitting: Internal Medicine

## 2011-02-26 ENCOUNTER — Telehealth: Payer: Self-pay | Admitting: Pulmonary Disease

## 2011-02-26 DIAGNOSIS — R609 Edema, unspecified: Secondary | ICD-10-CM

## 2011-02-26 DIAGNOSIS — I4891 Unspecified atrial fibrillation: Secondary | ICD-10-CM

## 2011-02-26 NOTE — Telephone Encounter (Signed)
SN sent order to [facility] on 10/3 for referral to headache clinic.  Pt still waiting on appt for this.  PCCs, please advise.  Thanks.

## 2011-02-26 NOTE — Telephone Encounter (Signed)
10/19--called and LMTCB--nt

## 2011-02-26 NOTE — Telephone Encounter (Signed)
Called this pt to inform her the # to call the headache clinic to check on the status of this appt and i will folu up also

## 2011-02-26 NOTE — Telephone Encounter (Signed)
Pt states her foot has been swelling since starting the Diltiazem.  Dr Graciela Husbands stopped the Diltiazem and started her on Lopressor.  She states the swelling has not improved.  She states the swelling is the same as when she saw Dr Graciela Husbands with the exception that it is up to her ankle now.  She states it was below her ankle when she saw Dr Graciela Husbands.  The swelling gets better while she is sleeping but does not go away.  It is in her left ankle/foot.  She denies any pitting.

## 2011-02-26 NOTE — Telephone Encounter (Signed)
Pt returning call to inform MD that pt foot and leg are still swelling. Please return pt call to discuss further.

## 2011-03-01 NOTE — Telephone Encounter (Signed)
She made need a diuretic  Would Rx Lasix 20 mg po everyother day for 10 days thks teve

## 2011-03-02 MED ORDER — METOPROLOL TARTRATE 50 MG PO TABS: ORAL_TABLET | ORAL | Status: DC

## 2011-03-02 MED ORDER — FUROSEMIDE 20 MG PO TABS: ORAL_TABLET | ORAL | Status: DC

## 2011-03-02 NOTE — Telephone Encounter (Signed)
I spoke with the patient. She is aware of Dr. Odessa Fleming recommendations. She is also needing a RX refill for lopressor 50mg  1/2 tablet bid.

## 2011-03-10 ENCOUNTER — Telehealth: Payer: Self-pay | Admitting: Pulmonary Disease

## 2011-03-10 NOTE — Telephone Encounter (Signed)
Will need to call [facility] in the AM since it is after 5pm. The # to call is [phone]. I spoke with pt and advised ehr will call in the AM to see what the status is. According to the 02/10/11 referral pt needed an ASAP apt.

## 2011-03-11 NOTE — Telephone Encounter (Signed)
Referral was faxed 02/11/11 but they say they never got it so refaxed everything and requested they call when received

## 2011-03-11 NOTE — Telephone Encounter (Signed)
Called and spoke with Gunnar Fusi at the [facility] and [employer]. She states that the order was never received. Will forward to [facility] to resend.

## 2011-04-29 ENCOUNTER — Telehealth: Payer: Self-pay | Admitting: Pulmonary Disease

## 2011-04-29 MED ORDER — AZITHROMYCIN 250 MG PO TABS: ORAL_TABLET | ORAL | Status: AC

## 2011-04-29 NOTE — Telephone Encounter (Signed)
Pt called back and she is aware of rx called to the pharmacy.

## 2011-04-29 NOTE — Telephone Encounter (Signed)
Per SN---ok for zpak  #1  Take as directed with no refills. [email or web address]

## 2011-04-29 NOTE — Telephone Encounter (Signed)
Spoke with pt. She is c/o prod cough with white sputum- onset was just last night. She states not having any other symptoms at all. Would like to have zpack called in so that she does not get any worse. Please advise, thanks! Allergies  Allergen Reactions  . Codeine     REACTION: causes nausea  . Penicillins     REACTION: rash

## 2011-05-05 ENCOUNTER — Ambulatory Visit (INDEPENDENT_AMBULATORY_CARE_PROVIDER_SITE_OTHER): Payer: Medicare Other | Admitting: Internal Medicine

## 2011-05-05 ENCOUNTER — Telehealth: Payer: Self-pay | Admitting: Pulmonary Disease

## 2011-05-05 ENCOUNTER — Ambulatory Visit (INDEPENDENT_AMBULATORY_CARE_PROVIDER_SITE_OTHER)
Admission: RE | Admit: 2011-05-05 | Discharge: 2011-05-05 | Disposition: A | Discharge status: Home / Self Care | Payer: Medicare Other | Source: Ambulatory Visit | Attending: Internal Medicine | Admitting: Internal Medicine

## 2011-05-05 ENCOUNTER — Encounter: Payer: Self-pay | Admitting: Internal Medicine

## 2011-05-05 VITALS — BP 128/88 | HR 83 | Temp 97.7°F | Ht 65.5 in | Wt 177.4 lb

## 2011-05-05 DIAGNOSIS — R059 Cough, unspecified: Secondary | ICD-10-CM

## 2011-05-05 DIAGNOSIS — R05 Cough: Secondary | ICD-10-CM

## 2011-05-05 MED ORDER — HYDROCODONE-ACETAMINOPHEN 5-500 MG PO TABS: 1.0000 | ORAL_TABLET | ORAL | Status: DC | PRN

## 2011-05-05 MED ORDER — PREDNISONE (PAK) 10 MG PO TABS: ORAL_TABLET | ORAL | Status: AC

## 2011-05-05 NOTE — Progress Notes (Signed)
Patient ID: Samantha Bishop, female   DOB: July 01, 1924, 77 y.o.   MRN: [id number]  Subjective:    Patient ID: Samantha Bishop, female    DOB: Mar 27, 1925, 77 y.o.   MRN: [id number]  HPI 86yowf never smoker with recurrent bronchitis up to twice yearly and in between feels fine  05/05/2011 f/u ov/Delcie Ruppert cc abruptly ill since 12/19 with cough and phoned in zpak 12/20 but cough so hard hurts from epigrastrium to spine when coughing. No purulent sputm. Coughs so hard she vomits. Assoc with background sensation of  Many months daily bilateral and nasal congestion eval by Pollyann Kennedy previously " I'm not going back because I don't want surgery"    Sleeping ok without nocturnal  or early am exacerbation  of respiratory  c/o's  Also denies any obvious fluctuation of symptoms with weather or environmental changes or other aggravating or alleviating factors except as outlined above   ROS  At present neg for  any significant sore throat, dysphagia, itching, sneezing,   excess/ purulent secretions,  fever, chills, sweats, unintended wt loss, pleuritic or exertional cp, hempoptysis, orthopnea pnd or leg swelling.  Also denies presyncope, palpitations, heartburn, abdominal pain, nausea, vomiting, diarrhea  or change in bowel or urinary habits, dysuria,hematuria,  rash, arthralgias, visual complaints, headache, numbness weakness or ataxia.          Problem List:  SINUSITIS, ACUTE (ICD-461.9) - she uses Nasal Saline Mist & FLONASE as needed... Prev eval DrRosen for ENT w/ deviated septum found, otherw neg exam, but it's hard for her to breathe thru her nose... ~  9/12: c/o left ear pain; neg exam- given Auralgan drops for prn use... 05/05/2011 sinus CT repeat: Mild mucosal thickening throughout the paranasal sinuses, more so  than was demonstrated in September. Retention cysts along the  floors of both maxillary sinuses. No evidence of advanced  sinusitis or free fluid.   HYPERTENSION (ICD-401.9) - controlled on  LOPRESSOR 50mg - 1/2 Bid now... ~  Baseline EKG= NSR, WNL... ~  2DEcho 1996 showed mild LVH, norm valves, EF=60%... ~  NuclearStressTest 4/04 was neg- no infarct or ischemia, EF=69%... similiar to 1999 study. ~  2DEcho 10/10 showed norm LV w/ EF=55-65%, mild AoV thickening, triv MR, LA=7mm... ~  8/12:  DrKlein switched her Diltiazem120 to Lopressor25Bid but to her c/o fatigue...  Hx of TACHYARRHYTHMIA (PAT) & ATRIAL FIBRILLATION> ~  hx atrial tachycardia and AV nodal reentry in 2003... s/p ablation, no recurrent symptoms...  ~  referred [facility] 10/10 w/ incr palpit & Holter Monitor showed PAF; DrKlein started PRADAXA 150mg Bid. ~  8/12:  DrKlein switched her Diltiazem120 to Lopressor25Bid but to her c/o fatigue...  Hx of DEEP VENOUS THROMBOPHLEBITIS (ICD-453.40)  Hx of CELLULITIS, ANKLE (ICD-682.6) - she had a DVT and a small PE after knee surgery in 1997... subseq eval of her veins by the vasc surgeons showed venous stasis disease...  developed cellulitis & ?of superficial thrombophlebitis 12/10- hosp transiently & VenDopplers neg for DVT, treated w/ BacterimDS, Naprosyn, Percocet & initially improved...  ~  1/11:  DrWainer sent her to DrFitzgerald for recurrent cellulitis RLE & he started prolonged course of Keflex & local wraps... ~  10/11:  She remains on suppressive therapy w/ Keflex 500mg /d from DrFitzgerald. ~  4/12:  She reports stopping the Keflex on her own ~1/12 & states no recurrent prob so far, doing OK. ~  7/12:  Note from DrGreenberg, [facility]- plans endovenous chemical & laser ablation procedures...  HYPERLIPIDEMIA (ICD-272.4) -  prev on Lipitor10 + FishOil 2/d... Lipid clinic ch to SIMVASTATIN for $$ reasons. ~  Lipid Clinic f/u 11/10 on Lip10 + FishOil 2/d showed TChol 143, TG 72, HDL 59, LDL 70 ~  FLP 4/11 on Simva20 showed TChol 130, TG 54, HDL 64, LDL 55 ~  FLP 10/11 on Simva20 showed TChol 122, TG 51, HDL 59, LDL 53 ~  ?Lipid Clinic decr her Simva20 to 10mg /d due  to Diltiazem rx... ~  FLP 5/12 on Simva10 showed TChol 163, TG 94, HDL 64, LDL 81   GERD (ICD-530.81) - last EGD was 3/06 showing chr gastritis & duodenitis... prev on Aciphex20, now Prn Pepcid vs Prilosec...  DIVERTICULOSIS OF COLON (ICD-562.10)   COLONIC POLYPS (ICD-211.3) - last colonoscopy was 1/07 by DrSam showing divertics only... f/u planned 82yrs... (last polyp was 1999 tiny & cauterized). ~  1/11:  c/o bowel irregularity & some blood seen after hosp for cellulitis- Rx w/ Align/ Activia/ Proctocort cream. ~  3/12:  Now 77 y/o & she doesn't want repeat colonoscopy while on Pradaxa...  Hx of RENAL CALCULUS (ICD-592.0) - stones seen in kidney on CTAbd 2007, non-obstructing & no hx of symptomatic nephrolithiasis.  DEGENERATIVE JOINT DISEASE (ICD-715.90) - prev hx of arthroscopy w/ post-op DVT and PTE in 1995... she had a right TKR by DrWainer 4/04> apparently she had arrhythmia post op "heart stopped for 18sec & DrWainer said I can't be put to sleep no more"  LOW BACK PAIN SYNDROME (ICD-724.2) - she has had LBP evauated by Ortho- DrWainer, DrGioffre; and by Neurosurg- DrJenkins in 2003... MRI showed a disc at L3-4 w/ mild spinal stenosis, but he did not rec surg...   OSTEOPENIA (ICD-733.90) - BMD followed by DrLomax, Gyn> -1.6 in [facility]... on Vivelle, Calcium, VitD.  NECK PAIN & HEADACHE - Dx w/ occip neuralgia & cervical degen disc dis w/ prev eval DrWeymann in 2009 & another eval at [facility] 7/10- treated w/ bilat occip nerve blocks 9/10 & improved... refered to DrRauck to continue this therapy closer to home. ~  4/12:  She reports that DrRauck tried shots for her cervicogenic HAs but they didn't help, she now uses OTC analgesics as needed. ~  9/12:  HOSP by TH w/ HA-  felt to be cervicogenic & tension related; she actually complains of chr daily HAs x yrs- this was the worst one yet; eval included CTBrain w/ atrophy, sm vessel dis, & right parietal cortical infarct (no hemorrhage); CTAngio  showed chr infarcts bilat, 4x67mm aneurysm in left cavernous carotid is unchanged from 4/11;  She states she was treated w/ Pred but she refused "it bloated me" (not mentioned in DCSummary); rec to take phys therapy but she declined "it didn't help in the past"; Given Robaxin & Percocet but she hasn't taken any since disch;  She has persistant cervicogenic headaches, known CSpine arthritis, & disappointed in DrRauck's prev treatment; rec referral to another pain clinic for management...  INTRACRANIAL ANEURYSM (ICD-437.3) - MRI/ MRA done 3/09 for HA eval revealed atophy, sm vessel dis, and an incidental 5mm aneurysm projecting posteriorly from the left ICA (cavernous segment)...  we contacted her relatives at [facility] and sent her scan and notes to Dr. Annamary Rummage at the [facility] of Radiology [phone] [id number] [place] [address]... they requested that we perform a CT Angio- done 09/08/07 and showed a 5mm aneurysm projecting posteriorly from the junction of the cavernous and supraclinoid segments of the left ICA...   ~  f/u  CTAngio 04/08/08 showed no significant change- she tells me that if she needs surg she wants to go to [facility] to see [facility] doctor...  ~  CDopplers 2010 showed calcif plaque in ICA's 0-39% stenoses. ~  f/u CTAngio 4/11 showed stable left cavernous ICA aneurysm measuring 4 x 5mm. ~  f/u CTAngio 9/12 showed chr infarcts bilat, 4x45mm aneurysm in left cavernous carotid is unchanged from 4/11...  ANXIETY (ICD-300.00) & INSOMNIA, CHRONIC (ICD-307.42) - she notes that Ambien 10mg  w/o help and wants something stronger- try RESTORIL 15mg  Qhs Prn...  GYN = DrLomax & seen 5/11... on VIVELLE & Ca++/ VitD... he did BMD in 10/07 w/ osteopenia, & f/u 5/11 showed min deterioration w/ TScores -0.8 in spine, and -1.6 in [facility]...   Past Surgical History  Procedure Date  . Tonsillectomy and adenoidectomy   . Appendectomy   . Abdominal hysterectomy   . Knee surgery   . Breast  cyst excision       Allergies  Allergen Reactions  . Codeine     REACTION: causes nausea  . Penicillins     REACTION: rash    .     Objective:   Physical Exam     WD, WN, 77 y/o WF in NAD... GENERAL:  Alert & oriented; pleasant & cooperative... HEENT:  [place]/AT, EOM-wnl, PERRLA, EACs-clear, TMs-wnl, NOSE- sl red, THROAT- no exud seen... NECK:  Supple w/ decrROM; no JVD; normal carotid impulses w/o bruits; no thyromegaly or nodules palpated; no lymphadenopathy. CHEST:  Clear to P & A; without wheezes/ rales/ or rhonchi. HEART:  Regular Rhythm; msc gr 1/6 SEM without rubs or gallops heard... ABDOMEN:  Soft & nontender; normal bowel sounds; no organomegaly or masses detected. EXT: without deformities, mod arthritic changes; no varicose veins/ +venous insuffic/ tr edema. Right lower leg wrapped, support hose, less discomfort...L >> R swelling NEURO:  CN's intact;  no focal neuro deficit... DERM:  No lesions noted; no rash etc... just chr ven insuffic changes in LE's...  CXR  05/05/2011 :  Cardiac enlargement. No active cardiopulmonary disease.  Assessment & Plan:

## 2011-05-05 NOTE — Patient Instructions (Addendum)
Try prilosec 20mg   Take 30-60 min before first meal of the day and Pepcid 20 mg one bedtime until cough is completely gone for at least a week without the need for cough suppression  I think of reflux for chronic cough like I do oxygen for fire (doesn't cause the fire but once you get the oxygen suppressed it usually goes away regardless of the exact cause).  GERD (REFLUX)  is an extremely common cause of respiratory symptoms, many times with no significant heartburn at all.    It can be treated with medication, but also with lifestyle changes including avoidance of late meals, excessive alcohol, smoking cessation, and avoid fatty foods, chocolate, peppermint, colas, red wine, and acidic juices such as orange juice.  NO MINT OR MENTHOL PRODUCTS SO NO COUGH DROPS  USE SUGARLESS CANDY INSTEAD (jolley ranchers or Stover's)  NO OIL BASED VITAMINS - use powdered substitutes - STOP FISH OIL UNTIL THE COUGH IS COMPLETELY GONE  Prednisone 10 mg take  4 each am x 2 days,   2 each am x 2 days,  1 each am x2days and stop   Ok to use vicodin every 4 hours for pain or cough  Please see patient coordinator before you leave today  to schedule sinus ct and we will call you with the results  Please remember to go to x-ray department downstairs for your tests - we will call you with the results when they are available.

## 2011-05-05 NOTE — Assessment & Plan Note (Addendum)
The most common causes of chronic cough in immunocompetent adults include the following: upper airway cough syndrome (UACS), previously referred to as postnasal drip syndrome (PNDS), which is caused by variety of rhinosinus conditions; (2) asthma; (3) GERD; (4) chronic bronchitis from cigarette smoking or other inhaled environmental irritants; (5) nonasthmatic eosinophilic bronchitis; and (6) bronchiectasis.   These conditions, singly or in combination, have accounted for up to 94% of the causes of chronic cough in prospective studies.   Other conditions have constituted no >6% of the causes in prospective studies These have included bronchogenic carcinoma, chronic interstitial pneumonia, sarcoidosis, left ventricular failure, ACEI-induced cough, and aspiration from a condition associated with pharyngeal dysfunction.   This is most c/w  Classic Upper airway cough syndrome, so named because it's frequently impossible to sort out how much is  CR/sinusitis with freq throat clearing (which can be related to primary GERD)   vs  causing  secondary (" extra esophageal")  GERD from wide swings in gastric pressure that occur with throat clearing, often  promoting self use of mint and menthol lozenges that reduce the lower esophageal sphincter tone and exacerbate the problem further in a cyclical fashion.   These are the same pts who not infrequently have failed to tolerate ace inhibitors,  dry powder inhalers or biphosphonates or report having reflux symptoms that don't respond to standard doses of PPI , and are easily confused as having aecopd or asthma flares or atypical cp syndromes as is the case here ( (she's coughing so hard her abd muscles and chest wall muscles are starting to hurt)  Since sinus ct not impressive for infection try short course of prednisone and max acid suppression and gerd diet for at least 2 weeks then regroup if not seeing improvement.

## 2011-05-05 NOTE — Telephone Encounter (Signed)
I spoke with pt and was requesting to come in and be seen today. MW had an opening at 9:45 and pt states she would like to come in at that time. Pt states she has been having chills, coughing, and fever.

## 2011-05-06 NOTE — Progress Notes (Signed)
Quick Note:  Spoke with pt and notified of results per Dr. Wert. Pt verbalized understanding and denied any questions.  ______ 

## 2011-05-28 DIAGNOSIS — IMO0002 Reserved for concepts with insufficient information to code with codable children: Secondary | ICD-10-CM | POA: Diagnosis not present

## 2011-05-28 DIAGNOSIS — M1710 Unilateral primary osteoarthritis, unspecified knee: Secondary | ICD-10-CM | POA: Diagnosis not present

## 2011-05-31 ENCOUNTER — Telehealth: Payer: Self-pay | Admitting: Internal Medicine

## 2011-05-31 NOTE — Telephone Encounter (Signed)
Pt  Having a Knee replacement in April, does she need a surgical consult? If not can send ok to dr Despina Hick [phone]/fax [phone]

## 2011-05-31 NOTE — Telephone Encounter (Signed)
seh should be seen by SW or LG for preop thanks

## 2011-06-01 ENCOUNTER — Telehealth: Payer: Self-pay | Admitting: Internal Medicine

## 2011-06-01 DIAGNOSIS — G518 Other disorders of facial nerve: Secondary | ICD-10-CM | POA: Diagnosis not present

## 2011-06-01 DIAGNOSIS — Z049 Encounter for examination and observation for unspecified reason: Secondary | ICD-10-CM | POA: Diagnosis not present

## 2011-06-01 DIAGNOSIS — R51 Headache: Secondary | ICD-10-CM | POA: Diagnosis not present

## 2011-06-01 NOTE — Telephone Encounter (Signed)
Appt scheduled with Norma Fredrickson for Tuesday, 06/08/11 at 2:30pm.  Pt was notified of date and time.

## 2011-06-01 NOTE — Telephone Encounter (Signed)
Labs,12,Lov faxed to [facility] @ [phone] 06/01/11/KM

## 2011-06-07 ENCOUNTER — Other Ambulatory Visit: Payer: Self-pay | Admitting: Orthopedic Surgery

## 2011-06-08 ENCOUNTER — Encounter: Payer: Self-pay | Admitting: Nurse Practitioner

## 2011-06-08 ENCOUNTER — Ambulatory Visit (INDEPENDENT_AMBULATORY_CARE_PROVIDER_SITE_OTHER): Payer: Medicare Other | Admitting: Nurse Practitioner

## 2011-06-08 VITALS — BP 136/84 | HR 92 | Ht 67.0 in | Wt 180.0 lb

## 2011-06-08 DIAGNOSIS — Z01818 Encounter for other preprocedural examination: Secondary | ICD-10-CM

## 2011-06-08 DIAGNOSIS — I4891 Unspecified atrial fibrillation: Secondary | ICD-10-CM

## 2011-06-08 NOTE — Assessment & Plan Note (Signed)
I have discussed her care with Dr. Graciela Husbands. She is doing well from our standpoint. She is not having any cardiac complaints. She does remain on her Pradaxa for her atrial fib. This could be transitioned to Xarelto in her post op phase. She is not interested in coumadin. She is felt to be an acceptable candidate for TKR. We will see her back in August at her regular appointment time. Patient is agreeable to this plan and will call if any problems develop in the interim.

## 2011-06-08 NOTE — Patient Instructions (Signed)
Dr. Graciela Husbands and I think you are an acceptable candidate for knee surgery.  We will see you back at your regular appointment time in August with Dr. Graciela Husbands.   Call the [facility] office at [phone] if you have any questions, problems or concerns.

## 2011-06-08 NOTE — Progress Notes (Signed)
Mar Daring Date of Birth: Jun 22, 1924 Medical Record #[id number]  History of Present Illness: Samantha Bishop is seen today for a preoperative clearance visit. She is seen for Dr. Graciela Husbands. She is planning on having left TKR in April with Dr. Lequita Halt. She has chronic atrial fib and is on Pradaxa. She has had remote DVT/PE following knee surgery. She has no known CAD. She denies chest pain. She is not short of breath. She will have some palpitations at night, but nothing that is bothersome for her. She is not lightheaded or dizzy. No syncope. Her last echo was in 2010 and showed normal EF. She says her blood pressure at home is satisfactory and lower than it is here today. She remains fairly active and uses both a back and left knee brace. She continues to have some swelling in the left foot which she now think may just be related to her "bad knee". She is seeing Dr. Kriste Basque tomorrow for clearance as well.   Current Outpatient Prescriptions on File Prior to Visit  Medication Sig Dispense Refill  . Cholecalciferol (VITAMIN D3) 1000 UNITS CAPS Take 2 tablets by mouth daily.        . dabigatran (PRADAXA) 150 MG CAPS Take 1 capsule (150 mg total) by mouth every 12 (twelve) hours.  180 capsule  3  . estradiol (VIVELLE-DOT) 0.05 MG/24HR Place 1 patch onto the skin 2 (two) times a week.        Marland Kitchen HYDROcodone-acetaminophen (VICODIN) 5-500 MG per tablet Take 1 tablet by mouth every 4 (four) hours as needed for pain (or cough). For severe pain.  40 tablet  0  . metoprolol (LOPRESSOR) 50 MG tablet Take 1/2 tablet by mouth two times daily  30 tablet  11  . Omega-3 Fatty Acids (FISH OIL) 1000 MG CAPS Take 2 capsules by mouth daily.        . simvastatin (ZOCOR) 20 MG tablet TAKE ONE TABLET BY MOUTH DAILY AT BEDTIME  30 tablet  9  . DISCONTD: mometasone (NASONEX) 50 MCG/ACT nasal spray 2 sprays by Nasal route at bedtime.  17 g  5  . methocarbamol (ROBAXIN) 500 MG tablet as needed.        Allergies  Allergen Reactions  .  Codeine     REACTION: causes nausea  . Penicillins     REACTION: rash    Past Medical History  Diagnosis Date  . Uncontrolled hypertension as indication for native nephrectomy   . Atrial fibrillation   . Tachycardia, unspecified   . Acute venous embolism and thrombosis of unspecified deep vessels of lower extremity   . Other and unspecified hyperlipidemia   . Esophageal reflux   . Diverticulosis of colon (without mention of hemorrhage)   . Benign neoplasm of colon   . Unspecified hemorrhoids without mention of complication   . Kidney stone disease   . Osteoarthrosis, unspecified whether generalized or localized, unspecified site   . Lumbago   . Cerebral aneurysm, nonruptured   . Anxiety state, unspecified   . Persistent disorder of initiating or maintaining sleep     Past Surgical History  Procedure Date  . Tonsillectomy and adenoidectomy   . Appendectomy   . Abdominal hysterectomy   . Knee surgery   . Breast cyst excision     History  Smoking status  . Never Smoker   Smokeless tobacco  . Never Used    History  Alcohol Use No    Family History  Problem Relation Age  of Onset  . Coronary artery disease    . Cirrhosis      Review of Systems: The review of systems is positive for knee pain.  No cardiac symptoms. All other systems were reviewed and are negative.  Physical Exam: BP 136/84  Pulse 92  Ht 5\' 7"  (1.702 m)  Wt 180 lb (81.647 kg)  BMI 28.19 kg/m2 Patient is very pleasant and in no acute distress. Skin is warm and dry. Color is normal.  HEENT is unremarkable. Normocephalic/atraumatic. PERRL. Sclera are nonicteric. Neck is supple. No masses. No JVD. Lungs are clear. Cardiac exam shows an irregular rhythm. Her rate is controlled. Abdomen is obese but soft. Extremities are with trace edema, left greater than right. Gait and ROM are intact. No gross neurologic deficits noted.   LABORATORY DATA: EKG today shows atrial fibrillation with a controlled  ventricular response.    Assessment / Plan:

## 2011-06-09 ENCOUNTER — Encounter: Payer: Self-pay | Admitting: Pulmonary Disease

## 2011-06-09 ENCOUNTER — Ambulatory Visit (INDEPENDENT_AMBULATORY_CARE_PROVIDER_SITE_OTHER): Payer: Medicare Other | Admitting: Pulmonary Disease

## 2011-06-09 DIAGNOSIS — M899 Disorder of bone, unspecified: Secondary | ICD-10-CM

## 2011-06-09 DIAGNOSIS — K219 Gastro-esophageal reflux disease without esophagitis: Secondary | ICD-10-CM

## 2011-06-09 DIAGNOSIS — F411 Generalized anxiety disorder: Secondary | ICD-10-CM

## 2011-06-09 DIAGNOSIS — M545 Low back pain, unspecified: Secondary | ICD-10-CM

## 2011-06-09 DIAGNOSIS — I10 Essential (primary) hypertension: Secondary | ICD-10-CM

## 2011-06-09 DIAGNOSIS — R51 Headache: Secondary | ICD-10-CM

## 2011-06-09 DIAGNOSIS — I4891 Unspecified atrial fibrillation: Secondary | ICD-10-CM

## 2011-06-09 DIAGNOSIS — K5730 Diverticulosis of large intestine without perforation or abscess without bleeding: Secondary | ICD-10-CM

## 2011-06-09 DIAGNOSIS — M1990 Unspecified osteoarthritis, unspecified site: Secondary | ICD-10-CM

## 2011-06-09 DIAGNOSIS — I671 Cerebral aneurysm, nonruptured: Secondary | ICD-10-CM

## 2011-06-09 DIAGNOSIS — I82409 Acute embolism and thrombosis of unspecified deep veins of unspecified lower extremity: Secondary | ICD-10-CM | POA: Diagnosis not present

## 2011-06-09 DIAGNOSIS — M542 Cervicalgia: Secondary | ICD-10-CM

## 2011-06-09 DIAGNOSIS — E785 Hyperlipidemia, unspecified: Secondary | ICD-10-CM | POA: Diagnosis not present

## 2011-06-09 MED ORDER — HYDROXYZINE HCL 25 MG PO TABS: 25.0000 mg | ORAL_TABLET | Freq: Four times a day (QID) | ORAL | Status: AC | PRN

## 2011-06-09 MED ORDER — HYDROCOD POLST-CHLORPHEN POLST 10-8 MG/5ML PO LQCR: 5.0000 mL | Freq: Two times a day (BID) | ORAL | Status: DC

## 2011-06-09 NOTE — Progress Notes (Signed)
Subjective:    Patient ID: ANJELI CASAD, female    DOB: Feb 02, 1925, 78 y.o.   MRN: [id number]  HPI 78 y/o WF here for a follow up visit... She has mult medical problems including:  HBP;  Hx PAT- s/p ablation2003;  PAF on Pradaxa;  HxDVT-resolved;  Hx recurrent Cellulitis right ankle;  Hyperchol;  GERD/ Gastirits;  Divertics/ Colon polyps;  Hx Kidney stones;  DJD w/ right TKR;  LBP;  Osteopenia;  NeckPain & HAs;  Intracranial Aneurysm;  Anxiety & chronic persistant insomnia...  ~  February 18, 2010:  She had f/u for her DJD & cellulitis by DrWainer & DrFitzgerald 5/11- he has kept her on suppressive Keflex 500mg /d, & compression hose (he suggested stopping the Keflex after 85mo to see if she had any recurrence)...  she has onychomycosis & DrTuckman wanted to use Lamisil, & when DrKlein saw her for Cards f/u 8/11 (AFib & palpit)- he changed Lopressor to CardizemCD 120mg /d... she now requests a perscription for Fluocinolone acetonide topical solution 0.01% stating this worked well for her daughter...    Her CC today is chronic daily HAs for which she had an eval in [place] including shots in her neck etc... she was then [place]Rauch [place] further treatment but he needed OK from DrKlein to hold the Pradaxa before any further interventions> OK to hold Pradaxa for 5d prior & she is waiting for f/u appt w/ DrRauch...  ~  August 20, 2010:  85mo ROV & review- see prob list below, she is off the prev Keflex suppressive Rx w/o obvious recurrence of her LE cellulitis; she is not fasting today & doesn't want lab work at this time, she will be due for f/u CTA in about 85mo...  ~  February 10, 2011:  85mo ROV - pt did not bring her meds to the OV today... C/o persisitent HAs & left ear pain (exam clear- given Auralgan)... She was HOSP 9/12 by [facility] w/ HA (see below)...    <HBP> Dr Graciela Husbands substituted Lopressor 25mg Bid for her Diltiazem; his note of 8/12 is reviewed; BP= 140/90 but she feels better & doesn't  want to switch back; c/o HAs but denies CP, palpit, SOB, or edema...    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    <Hx of DVT/ Cellulitis> she saw DrGreenberg ([facility]) who planned both endovenous chemical & laser ablation (his noted from 7/12 is reviewed)...    <Hyperlipid> on Simva20 per [facility]id Clinic; last FLP 5/12 reviewed & looked good on this med + diet etc...    <GI> GERD, Divertics, Polyps> she uses prn PPI vs H2blockers & denies upper or lower GI symptoms; she does not want repeat colon given her age & Pradaxa rx...    <DJD/ LBP> followed by DrWainer w/ right TKR 2004; she takes OTC analgesics prn...    <Neck Pain & Chronic HAs> she was HOSP 9/22-25/12 by [facility] w/ severe HA, felt to be cervicogenic & tension related; she actually complains of chr daily HAs x yrs- this was the worst one yet, she says & precipitated by loud hi-pitched gospel singing; eval included CTBrain w/ atrophy, sm vessel dis, & right parietal cortical infarct (no hemorrhage); CTAngio showed chr infarcts bilat, 4x31mm aneurysm in left cavernous carotid is unchanged from 4/11;  She states she was treated w/ Pred  but she refused "it bloated me" (not mentioned in DCSummary); rec to take phys therapy but she declined "it didn't help in the past"; Given Robaxin & Percocet but she hasn't taken any since disch;  She has persistant cervicogenic headaches, known CSpine arthritis, & disappointed in DrRauck's treatment; rec referral to another pain clinic for management (==> she went to DrFreeman's HA clinic for Rx of her occip neuralgia),,,    <Intracranial aneurysm> she had f/u CTAngio during the 9/12 admission for HA & the left cavernous carotid aneurysm is stable at 4x20mm (no change from 4/11)...    <Anxiety> she was given some Valium in the hosp 9/12 but not taking any  anxiolytic med as outpt, just Restoril 15mg  prn sleep...  ~  June 09, 2011:  59mo ROV & Kanesha tells me she is planning left TKR 4/13 by DrAlusio & she has received cardiac clearance from DrKlein et al> chr AFib on Metoprolol & Pradaxa, prev hx DVT/ PE after knee surg, no known CAD; they rec transition to Xarelto in the post-op period; she had right TKR at age 36;  Her CC today is dry skin & itching> rec to use a good moisturizing cream & Rx w/ Atarax Qid, Zyrtek 10mg /d, and Pepcid 20mg  Bid...    <HBP> controlled on Metoprolol25mg Bid; BP= 150/92 here & better than this at home she says; denies CP, palpit, SOB, dizzy, etc...    <AFib> followed by DrKlein on Pradaxa; they have cleared for the TKR surg...    <Chol> on Simva20 + Fish oil; FLP last checked 5/12 & parameters all at goal, not fasting today, continue same...    <DJD, LBP> she has Vicodin & Robaxin for prn use...    <HA, neck pain> managed by DrFreeman's HA clinic now & improved after occipital shots...    <Intracranial aneurysm> she had f/u CTA 9/12 & stable...       Problem List:  SINUSITIS, ACUTE (ICD-461.9) - she uses Nasal Saline Mist & FLONASE as needed... Prev eval DrRosen for ENT w/ deviated septum found, otherw neg exam, but it's hard for her to breathe thru her nose... ~  9/12: c/o left ear pain; neg exam- given Auralgan drops for prn use...  HYPERTENSION (ICD-401.9) - controlled on LOPRESSOR 50mg - 1/2 Bid now... ~  Baseline EKG= NSR, WNL... ~  2DEcho 1996 showed mild LVH, norm valves, EF=60%... ~  NuclearStressTest 4/04 was neg- no infarct or ischemia, EF=69%... similiar to 1999 study. ~  2DEcho 10/10 showed norm LV w/ EF=55-65%, mild AoV thickening, triv MR, LA=81mm... ~  8/12:  DrKlein switched her Diltiazem120 to Lopressor25Bid due to her c/o fatigue...  Hx of TACHYARRHYTHMIA (PAT) & ATRIAL FIBRILLATION> ~  hx atrial tachycardia and AV nodal reentry in 2003... s/p ablation, no recurrent symptoms...  ~  referred to  [facility] 10/10 w/ incr palpit & Holter Monitor showed PAF; DrKlein started PRADAXA 150mg Bid. ~  8/12:  DrKlein switched her Diltiazem120 to Lopressor25Bid but to her c/o fatigue...  Hx of DEEP VENOUS THROMBOPHLEBITIS (ICD-453.40)  Hx of CELLULITIS, ANKLE (ICD-682.6) - she had a DVT and a small PE after knee surgery in 1997... subseq eval of her veins by the vasc surgeons showed venous stasis disease...  developed cellulitis & ?of superficial thrombophlebitis 12/10- hosp transiently & VenDopplers neg for DVT, treated w/ BacterimDS, Naprosyn, Percocet & initially improved...  ~  1/11:  DrWainer sent her to DrFitzgerald for recurrent cellulitis RLE & he started prolonged course of Keflex & local wraps... ~  10/11:  She remains on suppressive therapy w/ Keflex 500mg /d from DrFitzgerald. ~  4/12:  She reports stopping the Keflex on her own ~1/12 & states no recurrent prob so far, doing OK. ~  7/12:  Note from DrGreenberg, [facility]- plans endovenous chemical & laser ablation procedures...  HYPERLIPIDEMIA (ICD-272.4) - prev on Lipitor10 + FishOil 2/d... Lipid clinic ch to SIMVASTATIN for $$ reasons. ~  Lipid Clinic f/u 11/10 on Lip10 + FishOil 2/d showed TChol 143, TG 72, HDL 59, LDL 70 ~  FLP 4/11 on Simva20 showed TChol 130, TG 54, HDL 64, LDL 55 ~  FLP 10/11 on Simva20 showed TChol 122, TG 51, HDL 59, LDL 53 ~  ?Lipid Clinic decr her Simva20 to 10mg /d due to Diltiazem rx... ~  FLP 5/12 on Simva10 showed TChol 163, TG 94, HDL 64, LDL 81   GERD (ICD-530.81) - last EGD was 3/06 showing chr gastritis & duodenitis... prev on Aciphex20, now Prn Pepcid vs Prilosec...  DIVERTICULOSIS OF COLON (ICD-562.10)   COLONIC POLYPS (ICD-211.3) - last colonoscopy was 1/07 by DrSam showing divertics only... f/u planned 22yrs... (last polyp was 1999 tiny & cauterized). ~  1/11:  c/o bowel irregularity & some blood seen after hosp for cellulitis- Rx w/ Align/ Activia/ Proctocort cream. ~  3/12:  Now 78 y/o & she  doesn't want repeat colonoscopy while on Pradaxa...  Hx of RENAL CALCULUS (ICD-592.0) - stones seen in kidney on CTAbd 2007, non-obstructing & no hx of symptomatic nephrolithiasis.  DEGENERATIVE JOINT DISEASE (ICD-715.90) - prev hx of arthroscopy w/ post-op DVT and PTE in 1995... she had a right TKR by DrWainer 4/04> apparently she had arrhythmia post op "heart stopped for 18sec & DrWainer said I can't be put to sleep no more"  LOW BACK PAIN SYNDROME (ICD-724.2) - she has had LBP evauated by Ortho- DrWainer, DrGioffre; and by Neurosurg- DrJenkins in 2003... MRI showed a disc at L3-4 w/ mild spinal stenosis, but he did not rec surg...   OSTEOPENIA (ICD-733.90) - BMD followed by DrLomax, Gyn> -1.6 in [facility]... on Vivelle, Calcium, VitD.  NECK PAIN & HEADACHE - Dx w/ occip neuralgia & cervical degen disc dis w/ prev eval DrWeymann in 2009 & another eval at [facility] 7/10- treated w/ bilat occip nerve blocks 9/10 & improved... refered to DrRauck to continue this therapy closer to home. ~  4/12:  She reports that DrRauck tried shots for her cervicogenic HAs but they didn't help, she now uses OTC analgesics as needed. ~  9/12:  HOSP by [facility] w/ HA-  felt to be cervicogenic & tension related; she actually complains of chr daily HAs x yrs- this was the worst one yet; eval included CTBrain w/ atrophy, sm vessel dis, & right parietal cortical infarct (no hemorrhage); CTAngio showed chr infarcts bilat, 4x23mm aneurysm in left cavernous carotid is unchanged from 4/11;  She states she was treated w/ Pred but she refused "it bloated me" (not mentioned in DCSummary); rec to take phys therapy but she declined "it didn't help in the past"; Given Robaxin & Percocet but she hasn't taken any since disch;  She has persistant cervicogenic headaches, known CSpine arthritis, & disappointed in DrRauck's prev treatment; rec referral to another pain clinic for management...  INTRACRANIAL ANEURYSM (ICD-437.3) - MRI/ MRA done 3/09 for HA  eval revealed atophy, sm vessel dis, and an incidental 5mm aneurysm projecting posteriorly from the left ICA (cavernous segment)...  we contacted her relatives at [facility] and sent her scan and  notes to Dr. Annamary Rummage at the [facility] [phone] [id number] [place] [address]... they requested that we perform a CT Angio- done 09/08/07 and showed a 5mm aneurysm projecting posteriorly from the junction of the cavernous and supraclinoid segments of the left ICA...   ~  f/u CTAngio 04/08/08 showed no significant change- she tells me that if she needs surg she wants to go to [facility] to see [facility] doctor...  ~  CDopplers 2010 showed calcif plaque in ICA's 0-39% stenoses. ~  f/u CTAngio 4/11 showed stable left cavernous ICA aneurysm measuring 4 x 5mm. ~  f/u CTAngio 9/12 showed chr infarcts bilat, 4x51mm aneurysm in left cavernous carotid is unchanged from 4/11...  ANXIETY (ICD-300.00) & INSOMNIA, CHRONIC (ICD-307.42) - she notes that Ambien 10mg  w/o help and wants something stronger- try RESTORIL 15mg  Qhs Prn...  GYN = DrLomax & seen 5/11... on VIVELLE & Ca++/ VitD... he did BMD in 10/07 w/ osteopenia, & f/u 5/11 showed min deterioration w/ TScores -0.8 in spine, and -1.6 in [facility]...   Past Surgical History  Procedure Date  . Tonsillectomy and adenoidectomy   . Appendectomy   . Abdominal hysterectomy   . Knee surgery   . Breast cyst excision     Outpatient Encounter Prescriptions as of 06/09/2011  Medication Sig Dispense Refill  . Cholecalciferol (VITAMIN D3) 1000 UNITS CAPS Take 2 tablets by mouth daily.        . dabigatran (PRADAXA) 150 MG CAPS Take 1 capsule (150 mg total) by mouth every 12 (twelve) hours.  180 capsule  3  . estradiol (VIVELLE-DOT) 0.05 MG/24HR Place 1 patch onto the skin 2 (two) times a week.        Marland Kitchen HYDROcodone-acetaminophen (VICODIN) 5-500 MG per tablet Take 1 tablet by mouth every 4 (four) hours as needed for pain (or cough). For severe pain.   40 tablet  0  . methocarbamol (ROBAXIN) 500 MG tablet as needed.      . metoprolol (LOPRESSOR) 50 MG tablet Take 1/2 tablet by mouth two times daily  30 tablet  11  . mometasone (NASONEX) 50 MCG/ACT nasal spray Place 2 sprays into the nose as needed.      . Omega-3 Fatty Acids (FISH OIL) 1000 MG CAPS Take 2 capsules by mouth daily.        . simvastatin (ZOCOR) 20 MG tablet TAKE ONE TABLET BY MOUTH DAILY AT BEDTIME  30 tablet  9    Allergies  Allergen Reactions  . Codeine     REACTION: causes nausea  . Penicillins     REACTION: rash    Current Medications, Allergies, Past Medical History, Past Surgical History, Family History, and Social History were reviewed in [employer] record.    Review of Systems        See HPI - all other systems neg except as noted...      The patient complains of dyspnea on exertion and sl peripheral edema.  The patient denies anorexia, fever, weight loss, weight gain, vision loss, decreased hearing, hoarseness, chest pain, syncope, prolonged cough, headaches, hemoptysis, abdominal pain, melena, hematochezia, severe indigestion/heartburn, hematuria, incontinence, muscle weakness, suspicious skin lesions, transient blindness, difficulty walking, depression, unusual weight change, abnormal bleeding, enlarged lymph nodes, and angioedema.     Objective:   Physical Exam     WD, WN, 78 y/o WF in NAD... GENERAL:  Alert & oriented; pleasant & cooperative... HEENT:  [place]/AT, EOM-wnl, PERRLA, EACs-clear, TMs-wnl,  NOSE- sl red, THROAT- no exud seen... NECK:  Supple w/ decrROM; no JVD; normal carotid impulses w/o bruits; no thyromegaly or nodules palpated; no lymphadenopathy. CHEST:  Clear to P & A; without wheezes/ rales/ or rhonchi. HEART:  Regular Rhythm; msc gr 1/6 SEM without rubs or gallops heard... ABDOMEN:  Soft & nontender; normal bowel sounds; no organomegaly or masses detected. EXT: without deformities, mod arthritic changes; no  varicose veins/ +venous insuffic/ tr edema. Right lower leg wrapped, support hose, less discomfort... NEURO:  CN's intact;  no focal neuro deficit... DERM:  No lesions noted; no rash etc... just chr ven insuffic changes in LE's...  RADIOLOGY DATA:  Reviewed in the EPIC EMR & discussed w/ the patient...    >>CXR 12/12 showed cardiomeg, clear lungs, NAD...    >>Sinus CT 12/12 showed bilat retention cysts along the floor of the max sinuses, mild mucosal thickening, NAD...  LABORATORY DATA:  Reviewed in the EPIC EMR & discussed w/ the patient...   Assessment & Plan:   HBP>  Controlled on the Lopressor + her diet etc;  States that BPs are better at home, continue same...  PAF>  Followed by DrKlein & stable on the Pradaxa & Lopressor for rate control;  She notes rare epis of palpit but they are min, brief, & self-limited; they cleared her for surg & suggested transition to [place] post op...  Hx of CELLULITIS>  Off the Keflex suppressive therapy & continues to do well w/o recurrent infection, pain, swelling, etc; she is followed by DrGreenberg [facility] & getting endovenous therapy...  CHOL>  Followed in the [facility] & stable on Simva20 w/ good FLP 5/12...  GI>  Stable on prn meds and she is not inclined to pursue another colonoscopy...  HEADACHES>  HAs improved w/ treatment via DrFreeman's HA management clinic...  ANEURYSM>  Clinically stable w/o vasc or cerebral ischemic symptoms;  CTA 9/12 reviewed & stable...  ANXIETY>  She uses Restoril for sleep...   Patient's Medications  New Prescriptions   CHLORPHENIRAMINE-HYDROCODONE (TUSSIONEX PENNKINETIC ER) 10-8 MG/5ML LQCR    Take 5 mLs by mouth every 12 (twelve) hours.  Previous Medications   CHOLECALCIFEROL (VITAMIN D3) 1000 UNITS CAPS    Take 2 tablets by mouth daily.     DABIGATRAN (PRADAXA) 150 MG CAPS    Take 1 capsule (150 mg total) by mouth every 12 (twelve) hours.   ESTRADIOL (VIVELLE-DOT) 0.05 MG/24HR    Place 1 patch onto the skin  2 (two) times a week.     HYDROCODONE-ACETAMINOPHEN (VICODIN) 5-500 MG PER TABLET    Take 1 tablet by mouth every 4 (four) hours as needed for pain (or cough). For severe pain.   METHOCARBAMOL (ROBAXIN) 500 MG TABLET    as needed.   METOPROLOL (LOPRESSOR) 50 MG TABLET    Take 1/2 tablet by mouth two times daily   MOMETASONE (NASONEX) 50 MCG/ACT NASAL SPRAY    Place 2 sprays into the nose as needed.   OMEGA-3 FATTY ACIDS (FISH OIL) 1000 MG CAPS    Take 2 capsules by mouth daily.     SIMVASTATIN (ZOCOR) 20 MG TABLET    TAKE ONE TABLET BY MOUTH DAILY AT BEDTIME  Modified Medications   No medications on file  Discontinued Medications   No medications on file

## 2011-06-09 NOTE — Patient Instructions (Signed)
Today we updated your med list in our EPIC system...    Continue your current medications the same...    We will refill the prescription for TUSSIONEX to use as needed for cough...  For your itching:    Be sure to use a good moisturizing cream twice daily...    Take the OTC ZYRTEK 10mg  each AM...    Take the OTC PEPCID 20mg  twice daily...    You may use the new prescription for ATARAX (Hyrdoxyzine) up to 4 times daily as needed for itching...  Good luck w/ the Knee surg in April.Marland KitchenMarland Kitchen

## 2011-06-29 DIAGNOSIS — N7689 Other specified inflammation of vagina and vulva: Secondary | ICD-10-CM | POA: Diagnosis not present

## 2011-06-29 DIAGNOSIS — N72 Inflammatory disease of cervix uteri: Secondary | ICD-10-CM | POA: Diagnosis not present

## 2011-07-06 DIAGNOSIS — M79609 Pain in unspecified limb: Secondary | ICD-10-CM | POA: Diagnosis not present

## 2011-07-12 DIAGNOSIS — Z85828 Personal history of other malignant neoplasm of skin: Secondary | ICD-10-CM | POA: Diagnosis not present

## 2011-07-12 DIAGNOSIS — B351 Tinea unguium: Secondary | ICD-10-CM | POA: Diagnosis not present

## 2011-07-14 ENCOUNTER — Ambulatory Visit: Payer: Medicare Other | Admitting: Pulmonary Disease

## 2011-07-29 ENCOUNTER — Other Ambulatory Visit: Payer: Self-pay | Admitting: Internal Medicine

## 2011-07-29 DIAGNOSIS — I4891 Unspecified atrial fibrillation: Secondary | ICD-10-CM

## 2011-07-29 MED ORDER — METOPROLOL TARTRATE 50 MG PO TABS: ORAL_TABLET | ORAL | Status: DC

## 2011-07-29 MED ORDER — DABIGATRAN ETEXILATE MESYLATE 150 MG PO CAPS: 150.0000 mg | ORAL_CAPSULE | Freq: Two times a day (BID) | ORAL | Status: DC

## 2011-07-29 MED ORDER — SIMVASTATIN 20 MG PO TABS: 20.0000 mg | ORAL_TABLET | Freq: Every day | ORAL | Status: DC

## 2011-07-29 NOTE — Telephone Encounter (Signed)
Pt needs all meds changes to a 3 month supply due to insurance please call Pharm to confirm

## 2011-08-13 DIAGNOSIS — R300 Dysuria: Secondary | ICD-10-CM | POA: Diagnosis not present

## 2011-08-13 DIAGNOSIS — R3989 Other symptoms and signs involving the genitourinary system: Secondary | ICD-10-CM | POA: Diagnosis not present

## 2011-08-17 DIAGNOSIS — N390 Urinary tract infection, site not specified: Secondary | ICD-10-CM | POA: Diagnosis not present

## 2011-08-18 ENCOUNTER — Encounter (HOSPITAL_COMMUNITY): Payer: Self-pay | Admitting: Pharmacy Technician

## 2011-08-20 ENCOUNTER — Encounter (HOSPITAL_COMMUNITY)
Admission: RE | Admit: 2011-08-20 | Discharge: 2011-08-20 | Disposition: A | Discharge status: Home / Self Care | Payer: Medicare Other | Source: Ambulatory Visit | Attending: Orthopedic Surgery | Admitting: Orthopedic Surgery

## 2011-08-20 ENCOUNTER — Encounter (HOSPITAL_COMMUNITY): Payer: Self-pay

## 2011-08-20 DIAGNOSIS — M1710 Unilateral primary osteoarthritis, unspecified knee: Secondary | ICD-10-CM | POA: Diagnosis not present

## 2011-08-20 DIAGNOSIS — IMO0002 Reserved for concepts with insufficient information to code with codable children: Secondary | ICD-10-CM | POA: Diagnosis not present

## 2011-08-20 HISTORY — DX: Adverse effect of unspecified anesthetic, initial encounter: T41.45XA

## 2011-08-20 HISTORY — DX: Sleep apnea, unspecified: G47.30

## 2011-08-20 HISTORY — DX: Personal history of other malignant neoplasm of skin: Z85.828

## 2011-08-20 HISTORY — DX: Other complications of anesthesia, initial encounter: T88.59XA

## 2011-08-20 HISTORY — DX: Other intervertebral disc displacement, lumbar region: M51.26

## 2011-08-20 LAB — COMPREHENSIVE METABOLIC PANEL
ALT: 44 U/L — ABNORMAL HIGH (ref 0–35)
AST: 37 U/L (ref 0–37)
Albumin: 3.9 g/dL (ref 3.5–5.2)
Alkaline Phosphatase: 45 U/L (ref 39–117)
BUN: 13 mg/dL (ref 6–23)
CO2: 26 mEq/L (ref 19–32)
Calcium: 9.2 mg/dL (ref 8.4–10.5)
Chloride: 104 mEq/L (ref 96–112)
Creatinine, Ser: 0.6 mg/dL (ref 0.50–1.10)
GFR calc Af Amer: >90 mL/min (ref >90)
GFR calc non Af Amer: 81 mL/min — ABNORMAL LOW (ref >90)
Glucose, Bld: 150 mg/dL — ABNORMAL HIGH (ref 70–99)
Potassium: 3.5 mEq/L (ref 3.5–5.1)
Sodium: 139 mEq/L (ref 135–145)
Total Bilirubin: 0.7 mg/dL (ref 0.3–1.2)
Total Protein: 7 g/dL (ref 6.0–8.3)

## 2011-08-20 LAB — URINALYSIS, ROUTINE W REFLEX MICROSCOPIC
Bilirubin Urine: NEGATIVE
Glucose, UA: NEGATIVE mg/dL
Hgb urine dipstick: NEGATIVE
Ketones, ur: NEGATIVE mg/dL
Leukocytes, UA: NEGATIVE
Nitrite: NEGATIVE
Protein, ur: NEGATIVE mg/dL
Specific Gravity, Urine: 1.014 (ref 1.005–1.030)
Urobilinogen, UA: 0.2 mg/dL (ref 0.0–1.0)
pH: 6.5 (ref 5.0–8.0)

## 2011-08-20 LAB — CBC
HCT: 39.3 % (ref 36.0–46.0)
Hemoglobin: 12.8 g/dL (ref 12.0–15.0)
MCH: 29.6 pg (ref 26.0–34.0)
MCHC: 32.6 g/dL (ref 30.0–36.0)
MCV: 90.8 fL (ref 78.0–100.0)
Platelets: 196 10*3/uL (ref 150–400)
RBC: 4.33 MIL/uL (ref 3.87–5.11)
RDW: 13.6 % (ref 11.5–15.5)
WBC: 7.4 10*3/uL (ref 4.0–10.5)

## 2011-08-20 LAB — SURGICAL PCR SCREEN
MRSA, PCR: NEGATIVE
Staphylococcus aureus: POSITIVE — AB

## 2011-08-20 LAB — PROTIME-INR
INR: 1.24 (ref 0.00–1.49)
Prothrombin Time: 15.9 seconds — ABNORMAL HIGH (ref 11.6–15.2)

## 2011-08-20 LAB — APTT: aPTT: 38 seconds — ABNORMAL HIGH (ref 24–37)

## 2011-08-20 MED ORDER — CHLORHEXIDINE GLUCONATE 4 % EX LIQD: 60.0000 mL | Freq: Once | CUTANEOUS | Status: DC

## 2011-08-20 NOTE — Progress Notes (Signed)
08/20/11 1647  OBSTRUCTIVE SLEEP APNEA  Score 4 or greater  Updated health history;Results sent to PCP

## 2011-08-20 NOTE — Patient Instructions (Signed)
20 Samantha Bishop  08/20/2011   Your procedure is scheduled on:  09/03/11  Report to SHORT STAY DEPT  at 10:30 AM.  Call this number if you have problems the morning of surgery: [phone]   Remember:   Do not eat food or drink liquids AFTER MIDNIGHT  May have clear liquids UNTIL 6 HOURS BEFORE SURGERY (7:00 AM)  Clear liquids include soda, tea, black coffee, apple or grape juice, broth.  Take these medicines the morning of surgery with A SIP OF WATER: LOPRESSOR   Do not wear jewelry, make-up or nail polish.  Do not wear lotions, powders, or perfumes.   Do not shave legs or underarms 48 hrs. before surgery (men may shave face)  Do not bring valuables to the hospital.  Contacts, dentures or bridgework may not be worn into surgery.  Leave suitcase in the car. After surgery it may be brought to your room.  For patients admitted to the hospital, checkout time is 11:00 AM the day of discharge.   Patients discharged the day of surgery will not be allowed to drive home.    Special Instructions:   Please read over the following fact sheets that you were given: MRSA  Information / Incentive Spirometer               SHOWER WITH BETASEPT THE NIGHT BEFORE SURGERY AND THE MORNING OF SURGERY

## 2011-08-20 NOTE — H&P (Signed)
Mar Daring DOB: 1925/01/10  Chief Complaint: left knee pain  History of Present Illness The patient is a 78 year old female who comes in today for a preoperative History and Physical. The patient is scheduled for a left total knee arthroplasty to be performed by Dr. Gus Rankin. Aluisio, MD at [facility] on September 03, 2011 . The patient reports left knee symptoms including pain, instability, giving way, weakness and stiffness which began 9 years ago without any known injury. The patient feels that the symptoms are worsening. Past treatment for this problem has included knee brace, intra-articular injection of corticosteroids and physical therapy. Current treatment includes application of ice and knee brace. She states the left knee is hurting as bad as the right one ever did. The right knee replacement has done well. She is at a stage now where the left knee is preventing her from doing things. She is ready to contemplate getting it fixed and is here today to discuss knee replacement. Most predictable means for increased function and decreased pain is left total knee arthroplasty. Risks and benefits of surgery discussed. PCP: Dr. Alroy Dust Cardio: Dr. Sherryl Manges   Past Medical History Osteoarthritis, knee Diverticulitis Of Colon Aneurysm. brain 2009 Atrial Fibrillation Cellulitis. right leg Urinary Tract Infection   Allergies Penicillins   Family History Father. deceased age 84 due to heart disease and CVA Mother. deceased age 22 due to MI   Social History Alcohol use. never consumed alcohol Children. 3 Current work status. retired Financial planner (Currently). no Drug/Alcohol Rehab (Previously). no Exercise. Exercises weekly; does running / walking Illicit drug use. no Living situation. [facility] Marital status. divorced Tobacco / smoke exposure. no Tobacco use. never smoker Post-Surgical Plans.  rehab facility [facility] Steps entering home: 4 Advance Directives. none   Medication History Pradaxa (75MG  Capsule, Oral two times daily) Active. Simvastatin ( Oral at bedtime) Specific dose unknown - Active. Lopressor (50MG  Tablet, Oral two times daily) Active. Vivelle-Dot ( Transdermal) Specific dose unknown - Active.   Pregnancy / Birth History Pregnant. no   Past Surgical History Appendectomy Arthroscopy of Knee. left Cataract Surgery. left Hemorrhoidectomy Hysterectomy. complete (non-cancerous) Rotator Cuff Repair. right Tonsillectomy Total Knee Replacement. right, around 2004    Review of Systems General:Not Present- Chills, Fever, Night Sweats, Appetite Loss, Fatigue, Feeling sick, Weight Gain and Weight Loss. Skin:Not Present- Itching, Rash, Skin Color Changes, Ulcer, Psoriasis and Change in Hair or Nails. HEENT:Not Present- Sensitivity to light, Hearing problems, Nose Bleed and Ringing in the Ears. Neck:Not Present- Swollen Glands and Neck Mass. Respiratory:Not Present- Snoring, Chronic Cough, Bloody sputum and Dyspnea. Cardiovascular:Not Present- Shortness of Breath, Chest Pain, Swelling of Extremities, Leg Cramps and Palpitations. Gastrointestinal:Present- Constipation. Not Present- Bloody Stool, Heartburn, Abdominal Pain, Vomiting, Nausea and Incontinence of Stool. Female Genitourinary:Present- Dysuria. Not Present- Blood in Urine, Menstrual Irregularities, Frequency, Incontinence and Nocturia. Musculoskeletal:Present- Joint Stiffness, Joint Swelling and Joint Pain. Not Present- Muscle Weakness, Muscle Pain and Back Pain. Neurological:Not Present- Tingling, Numbness, Burning, Tremor, Headaches and Dizziness. Psychiatric:Not Present- Anxiety, Depression and Memory Loss. Endocrine:Not Present- Cold Intolerance, Heat Intolerance, Excessive hunger and Excessive Thirst. Hematology:Not Present- Abnormal Bleeding, Anemia, Blood Clots and Easy  Bruising.   Vitals Weight: 180.25 lb Height: 64.5 in Body Surface Area: 1.93 m Body Mass Index: 30.46 kg/m Pain Level: 5/10 Pulse: 88 (Regular) BP: 151/87 (Sitting, Right Arm, Standard)    Physical Exam General Mental Status - Alert, cooperative and good historian. General Appearance- pleasant. Not  in acute distress. Orientation- Oriented X3. Build & Nutrition- Overweight, Well nourished and Well developed. Head and Neck Head- normocephalic, atraumatic . Neck Global Assessment- supple. no bruit auscultated on the right and no bruit auscultated on the left. Eye Pupil- Bilateral- Normal. Motion- Bilateral- EOMI. Chest and Lung Exam Auscultation: Breath sounds:- clear at anterior chest wall and - clear at posterior chest wall. Adventitious sounds:- No Adventitious sounds. Cardiovascular Auscultation:Rhythm- Irregularly irregular. Heart Sounds- S1 WNL and S2 WNL. Murmurs & Other Heart Sounds:Auscultation of the heart reveals - No Murmurs. Abdomen Palpation/Percussion:Tenderness- Left Lower Quadrant. Rigidity (guarding)- Abdomen is soft. Auscultation:Auscultation of the abdomen reveals - Bowel sounds normal. Female Genitourinary Not done, not pertinent to present illness Peripheral Vascular Upper Extremity: Palpation:- Pulses bilaterally normal. Lower Extremity: Palpation:- Pulses bilaterally normal. Neurologic Examination of related systems reveals - normal muscle strength and tone in all extremities. Neurologic evaluation reveals - normal sensation and upper and lower extremity deep tendon reflexes intact bilaterally . Musculoskeletal Her hips show normal ROM with no discomfort. Her right knee has a well-healed anterior scar, range about 5-110. There is no tenderness or instability. Left knee: Varus deformity, range 5-110. Moderate crepitus on ROM. Tender medial greater than lateral with no instability noted. Pulses,  sensation and motor are intact.   RADIOGRAPHS: AP of both knees, lateral of the left show severe bone on bone arthritis, medial and patellofemoral compartments of the left knee with a varus deformity with bony erosion of the tibia. Her right knee, on AP, shows the prosthesis in good position with no abnormalities.   Assessment & Plan Osteoarthritis, Knee (715.96) Left total knee arthroplasty    Dimitri Ped, PA-C

## 2011-08-24 ENCOUNTER — Other Ambulatory Visit: Payer: Self-pay | Admitting: Internal Medicine

## 2011-08-24 DIAGNOSIS — I4891 Unspecified atrial fibrillation: Secondary | ICD-10-CM

## 2011-08-24 MED ORDER — METOPROLOL TARTRATE 50 MG PO TABS: ORAL_TABLET | ORAL | Status: DC

## 2011-08-24 NOTE — Telephone Encounter (Signed)
Refill- F/U   Patient was suppose to get 3 month supply for Metoprolol, only received 1 month.  Verified preferred as [facility] on [address]. Patient can be reached at [phone]

## 2011-08-31 DIAGNOSIS — R300 Dysuria: Secondary | ICD-10-CM | POA: Diagnosis not present

## 2011-08-31 DIAGNOSIS — N952 Postmenopausal atrophic vaginitis: Secondary | ICD-10-CM | POA: Diagnosis not present

## 2011-08-31 DIAGNOSIS — R3989 Other symptoms and signs involving the genitourinary system: Secondary | ICD-10-CM | POA: Diagnosis not present

## 2011-09-02 MED ORDER — BUPIVACAINE 0.25 % ON-Q PUMP SINGLE CATH 300ML
300.0000 mL | INJECTION | Status: DC
  Filled 2011-09-02: qty 300

## 2011-09-03 ENCOUNTER — Encounter (HOSPITAL_COMMUNITY): Payer: Self-pay | Admitting: *Deleted

## 2011-09-03 ENCOUNTER — Encounter (HOSPITAL_COMMUNITY): Payer: Self-pay | Admitting: Anesthesiology

## 2011-09-03 ENCOUNTER — Ambulatory Visit (HOSPITAL_COMMUNITY): Payer: Medicare Other | Admitting: Anesthesiology

## 2011-09-03 ENCOUNTER — Encounter (HOSPITAL_COMMUNITY)
Admission: RE | Disposition: A | Discharge status: Skilled Nursing Facility | Payer: Self-pay | Source: Ambulatory Visit | Attending: Orthopedic Surgery

## 2011-09-03 ENCOUNTER — Inpatient Hospital Stay (HOSPITAL_COMMUNITY)
Admission: RE | Admit: 2011-09-03 | Discharge: 2011-09-06 | DRG: 470 | Disposition: A | Discharge status: Skilled Nursing Facility | Payer: Medicare Other | Source: Ambulatory Visit | Attending: Orthopedic Surgery | Admitting: Orthopedic Surgery

## 2011-09-03 DIAGNOSIS — E785 Hyperlipidemia, unspecified: Secondary | ICD-10-CM | POA: Diagnosis present

## 2011-09-03 DIAGNOSIS — E871 Hypo-osmolality and hyponatremia: Secondary | ICD-10-CM | POA: Diagnosis not present

## 2011-09-03 DIAGNOSIS — G4730 Sleep apnea, unspecified: Secondary | ICD-10-CM | POA: Diagnosis present

## 2011-09-03 DIAGNOSIS — Z86711 Personal history of pulmonary embolism: Secondary | ICD-10-CM | POA: Diagnosis not present

## 2011-09-03 DIAGNOSIS — M25569 Pain in unspecified knee: Secondary | ICD-10-CM | POA: Diagnosis not present

## 2011-09-03 DIAGNOSIS — I10 Essential (primary) hypertension: Secondary | ICD-10-CM | POA: Diagnosis present

## 2011-09-03 DIAGNOSIS — Z96659 Presence of unspecified artificial knee joint: Secondary | ICD-10-CM

## 2011-09-03 DIAGNOSIS — M1710 Unilateral primary osteoarthritis, unspecified knee: Principal | ICD-10-CM | POA: Diagnosis present

## 2011-09-03 DIAGNOSIS — N390 Urinary tract infection, site not specified: Secondary | ICD-10-CM | POA: Diagnosis not present

## 2011-09-03 DIAGNOSIS — D62 Acute posthemorrhagic anemia: Secondary | ICD-10-CM | POA: Diagnosis not present

## 2011-09-03 DIAGNOSIS — Z01812 Encounter for preprocedural laboratory examination: Secondary | ICD-10-CM

## 2011-09-03 DIAGNOSIS — Z7901 Long term (current) use of anticoagulants: Secondary | ICD-10-CM

## 2011-09-03 DIAGNOSIS — K219 Gastro-esophageal reflux disease without esophagitis: Secondary | ICD-10-CM | POA: Diagnosis present

## 2011-09-03 DIAGNOSIS — I4891 Unspecified atrial fibrillation: Secondary | ICD-10-CM | POA: Diagnosis present

## 2011-09-03 DIAGNOSIS — IMO0002 Reserved for concepts with insufficient information to code with codable children: Secondary | ICD-10-CM | POA: Diagnosis not present

## 2011-09-03 DIAGNOSIS — Z5189 Encounter for other specified aftercare: Secondary | ICD-10-CM | POA: Diagnosis not present

## 2011-09-03 DIAGNOSIS — M1990 Unspecified osteoarthritis, unspecified site: Secondary | ICD-10-CM | POA: Diagnosis not present

## 2011-09-03 HISTORY — PX: TOTAL KNEE ARTHROPLASTY: SHX125

## 2011-09-03 LAB — TYPE AND SCREEN
ABO/RH(D): A POS
Antibody Screen: NEGATIVE

## 2011-09-03 LAB — ABO/RH: ABO/RH(D): A POS

## 2011-09-03 SURGERY — ARTHROPLASTY, KNEE, TOTAL
Anesthesia: Spinal | Site: Knee | Laterality: Left | Wound class: Clean

## 2011-09-03 MED ORDER — BISACODYL 10 MG RE SUPP
10.0000 mg | Freq: Every day | RECTAL | Status: DC | PRN
  Administered 2011-09-05: 10 mg via RECTAL
  Filled 2011-09-03: qty 1

## 2011-09-03 MED ORDER — PROPOFOL 10 MG/ML IV EMUL
INTRAVENOUS | Status: DC | PRN
  Administered 2011-09-03: 75 ug/kg/min via INTRAVENOUS

## 2011-09-03 MED ORDER — CEFAZOLIN SODIUM-DEXTROSE 2-3 GM-% IV SOLR
INTRAVENOUS | Status: AC
  Filled 2011-09-03: qty 50

## 2011-09-03 MED ORDER — LACTATED RINGERS IV SOLN
INTRAVENOUS | Status: DC
  Administered 2011-09-03: 15:00:00 via INTRAVENOUS

## 2011-09-03 MED ORDER — POTASSIUM CHLORIDE IN NACL 20-0.9 MEQ/L-% IV SOLN
INTRAVENOUS | Status: DC
  Administered 2011-09-03 – 2011-09-04 (×2): via INTRAVENOUS
  Filled 2011-09-03 (×6): qty 1000

## 2011-09-03 MED ORDER — TETRAHYDROZOLINE-ZN SULFATE 0.05-0.25 % OP SOLN: 1.0000 [drp] | Freq: Three times a day (TID) | OPHTHALMIC | Status: DC | PRN

## 2011-09-03 MED ORDER — RIVAROXABAN 10 MG PO TABS
10.0000 mg | ORAL_TABLET | Freq: Every day | ORAL | Status: DC
  Administered 2011-09-04 – 2011-09-06 (×3): 10 mg via ORAL
  Filled 2011-09-03 (×4): qty 1

## 2011-09-03 MED ORDER — DOCUSATE SODIUM 100 MG PO CAPS
100.0000 mg | ORAL_CAPSULE | Freq: Two times a day (BID) | ORAL | Status: DC
  Administered 2011-09-04 – 2011-09-06 (×5): 100 mg via ORAL
  Filled 2011-09-03 (×7): qty 1

## 2011-09-03 MED ORDER — ACETAMINOPHEN 325 MG PO TABS: 650.0000 mg | ORAL_TABLET | Freq: Four times a day (QID) | ORAL | Status: DC | PRN

## 2011-09-03 MED ORDER — HYDROMORPHONE HCL PF 1 MG/ML IJ SOLN
INTRAMUSCULAR | Status: AC
  Filled 2011-09-03: qty 1

## 2011-09-03 MED ORDER — METOCLOPRAMIDE HCL 10 MG PO TABS: 5.0000 mg | ORAL_TABLET | Freq: Three times a day (TID) | ORAL | Status: DC | PRN

## 2011-09-03 MED ORDER — FLEET ENEMA 7-19 GM/118ML RE ENEM: 1.0000 | ENEMA | Freq: Once | RECTAL | Status: AC | PRN

## 2011-09-03 MED ORDER — BUPIVACAINE 0.25 % ON-Q PUMP SINGLE CATH 300ML
INJECTION | Status: DC | PRN
  Administered 2011-09-03: 300 mL

## 2011-09-03 MED ORDER — MIDAZOLAM HCL 5 MG/5ML IJ SOLN
INTRAMUSCULAR | Status: DC | PRN
  Administered 2011-09-03: 1 mg via INTRAVENOUS

## 2011-09-03 MED ORDER — ONDANSETRON HCL 4 MG PO TABS: 4.0000 mg | ORAL_TABLET | Freq: Four times a day (QID) | ORAL | Status: DC | PRN

## 2011-09-03 MED ORDER — DEXTROSE 5 % IV SOLN
500.0000 mg | Freq: Four times a day (QID) | INTRAVENOUS | Status: DC | PRN
  Administered 2011-09-03: 500 mg via INTRAVENOUS
  Filled 2011-09-03: qty 5

## 2011-09-03 MED ORDER — BUPIVACAINE HCL 0.75 % IJ SOLN
INTRAMUSCULAR | Status: DC | PRN
  Administered 2011-09-03: 15 mg via INTRATHECAL

## 2011-09-03 MED ORDER — ONDANSETRON HCL 4 MG/2ML IJ SOLN
INTRAMUSCULAR | Status: AC
  Filled 2011-09-03: qty 2

## 2011-09-03 MED ORDER — METOCLOPRAMIDE HCL 5 MG/ML IJ SOLN
5.0000 mg | Freq: Three times a day (TID) | INTRAMUSCULAR | Status: DC | PRN
  Administered 2011-09-03 – 2011-09-04 (×2): 10 mg via INTRAVENOUS
  Filled 2011-09-03 (×2): qty 2

## 2011-09-03 MED ORDER — ONDANSETRON HCL 4 MG/2ML IJ SOLN
4.0000 mg | Freq: Four times a day (QID) | INTRAMUSCULAR | Status: DC | PRN
  Administered 2011-09-03 (×2): 4 mg via INTRAVENOUS
  Filled 2011-09-03: qty 2

## 2011-09-03 MED ORDER — FENTANYL CITRATE 0.05 MG/ML IJ SOLN
INTRAMUSCULAR | Status: DC | PRN
  Administered 2011-09-03 (×2): 50 ug via INTRAVENOUS

## 2011-09-03 MED ORDER — DIPHENHYDRAMINE HCL 12.5 MG/5ML PO ELIX: 12.5000 mg | ORAL_SOLUTION | ORAL | Status: DC | PRN

## 2011-09-03 MED ORDER — TEMAZEPAM 15 MG PO CAPS: 15.0000 mg | ORAL_CAPSULE | Freq: Every evening | ORAL | Status: DC | PRN

## 2011-09-03 MED ORDER — HYDROMORPHONE HCL PF 1 MG/ML IJ SOLN
0.2500 mg | INTRAMUSCULAR | Status: DC | PRN
  Administered 2011-09-03 (×2): 0.25 mg via INTRAVENOUS
  Administered 2011-09-03: 0.5 mg via INTRAVENOUS
  Administered 2011-09-03: 0.25 mg via INTRAVENOUS

## 2011-09-03 MED ORDER — LACTATED RINGERS IV SOLN
INTRAVENOUS | Status: DC
  Administered 2011-09-03: 14:00:00 via INTRAVENOUS
  Administered 2011-09-03: 1000 mL via INTRAVENOUS

## 2011-09-03 MED ORDER — HYDROMORPHONE HCL 2 MG PO TABS
2.0000 mg | ORAL_TABLET | ORAL | Status: DC | PRN
  Administered 2011-09-04 – 2011-09-06 (×3): 2 mg via ORAL
  Filled 2011-09-03 (×3): qty 1

## 2011-09-03 MED ORDER — CEFAZOLIN SODIUM 1-5 GM-% IV SOLN
1.0000 g | Freq: Four times a day (QID) | INTRAVENOUS | Status: AC
  Administered 2011-09-03 – 2011-09-04 (×3): 1 g via INTRAVENOUS
  Filled 2011-09-03 (×3): qty 50

## 2011-09-03 MED ORDER — SODIUM CHLORIDE 0.9 % IR SOLN
Status: DC | PRN
  Administered 2011-09-03: 3000 mL

## 2011-09-03 MED ORDER — TRAMADOL HCL 50 MG PO TABS
50.0000 mg | ORAL_TABLET | Freq: Four times a day (QID) | ORAL | Status: DC | PRN
  Administered 2011-09-04 (×2): 50 mg via ORAL
  Administered 2011-09-05 – 2011-09-06 (×4): 100 mg via ORAL
  Filled 2011-09-03: qty 2
  Filled 2011-09-03: qty 1
  Filled 2011-09-03 (×2): qty 2
  Filled 2011-09-03: qty 1
  Filled 2011-09-03: qty 2

## 2011-09-03 MED ORDER — METHOCARBAMOL 500 MG PO TABS
ORAL_TABLET | ORAL | Status: AC
  Filled 2011-09-03: qty 1

## 2011-09-03 MED ORDER — NAPHAZOLINE HCL 0.1 % OP SOLN
1.0000 [drp] | Freq: Two times a day (BID) | OPHTHALMIC | Status: DC | PRN
  Filled 2011-09-03: qty 15

## 2011-09-03 MED ORDER — METHOCARBAMOL 500 MG PO TABS
500.0000 mg | ORAL_TABLET | Freq: Four times a day (QID) | ORAL | Status: DC | PRN
  Administered 2011-09-04 (×2): 500 mg via ORAL
  Filled 2011-09-03 (×2): qty 1

## 2011-09-03 MED ORDER — SIMVASTATIN 20 MG PO TABS
20.0000 mg | ORAL_TABLET | Freq: Every day | ORAL | Status: DC
  Administered 2011-09-04 – 2011-09-05 (×2): 20 mg via ORAL
  Filled 2011-09-03 (×4): qty 1

## 2011-09-03 MED ORDER — METOPROLOL TARTRATE 25 MG PO TABS
25.0000 mg | ORAL_TABLET | Freq: Two times a day (BID) | ORAL | Status: DC
  Administered 2011-09-04 – 2011-09-06 (×5): 25 mg via ORAL
  Filled 2011-09-03 (×7): qty 1

## 2011-09-03 MED ORDER — CEFAZOLIN SODIUM-DEXTROSE 2-3 GM-% IV SOLR
2.0000 g | INTRAVENOUS | Status: AC
  Administered 2011-09-03: 2 g via INTRAVENOUS

## 2011-09-03 MED ORDER — ACETAMINOPHEN 10 MG/ML IV SOLN
1000.0000 mg | Freq: Four times a day (QID) | INTRAVENOUS | Status: DC
  Administered 2011-09-03: 1000 mg via INTRAVENOUS

## 2011-09-03 MED ORDER — PHENOL 1.4 % MT LIQD: 1.0000 | OROMUCOSAL | Status: DC | PRN

## 2011-09-03 MED ORDER — HYDROMORPHONE HCL PF 1 MG/ML IJ SOLN
0.5000 mg | INTRAMUSCULAR | Status: DC | PRN
  Administered 2011-09-03: 1 mg via INTRAVENOUS
  Administered 2011-09-03: 0.5 mg via INTRAVENOUS
  Administered 2011-09-03: 1 mg via INTRAVENOUS
  Administered 2011-09-04: 0.5 mg via INTRAVENOUS
  Administered 2011-09-04 (×2): 1 mg via INTRAVENOUS
  Filled 2011-09-03 (×7): qty 1

## 2011-09-03 MED ORDER — DEXAMETHASONE SODIUM PHOSPHATE 10 MG/ML IJ SOLN: 10.0000 mg | Freq: Once | INTRAMUSCULAR | Status: DC

## 2011-09-03 MED ORDER — ACETAMINOPHEN 650 MG RE SUPP: 650.0000 mg | Freq: Four times a day (QID) | RECTAL | Status: DC | PRN

## 2011-09-03 MED ORDER — ACETAMINOPHEN 10 MG/ML IV SOLN
INTRAVENOUS | Status: AC
  Filled 2011-09-03: qty 100

## 2011-09-03 MED ORDER — BUPIVACAINE ON-Q PAIN PUMP (FOR ORDER SET NO CHG)
INJECTION | Status: DC
  Filled 2011-09-03: qty 1

## 2011-09-03 MED ORDER — MENTHOL 3 MG MT LOZG
1.0000 | LOZENGE | OROMUCOSAL | Status: DC | PRN
  Filled 2011-09-03: qty 9

## 2011-09-03 MED ORDER — PROPOFOL 10 MG/ML IV BOLUS
INTRAVENOUS | Status: DC | PRN
  Administered 2011-09-03: 30 mg via INTRAVENOUS

## 2011-09-03 MED ORDER — POLYETHYLENE GLYCOL 3350 17 G PO PACK
17.0000 g | PACK | Freq: Every day | ORAL | Status: DC | PRN
  Filled 2011-09-03: qty 1

## 2011-09-03 SURGICAL SUPPLY — 53 items
BAG SPEC THK2 15X12 ZIP CLS (MISCELLANEOUS) ×1
BAG ZIPLOCK 12X15 (MISCELLANEOUS) ×2 IMPLANT
BANDAGE ELASTIC 6 VELCRO ST LF (GAUZE/BANDAGES/DRESSINGS) ×2 IMPLANT
BANDAGE ESMARK 6X9 LF (GAUZE/BANDAGES/DRESSINGS) ×1 IMPLANT
BLADE SAG 18X100X1.27 (BLADE) ×2 IMPLANT
BLADE SAW SGTL 11.0X1.19X90.0M (BLADE) ×2 IMPLANT
BNDG CMPR 9X6 STRL LF SNTH (GAUZE/BANDAGES/DRESSINGS) ×1
BNDG ESMARK 6X9 LF (GAUZE/BANDAGES/DRESSINGS) ×2
BOWL SMART MIX CTS (DISPOSABLE) ×2 IMPLANT
CATH KIT ON-Q SILVERSOAK 5 (CATHETERS) ×1 IMPLANT
CATH KIT ON-Q SILVERSOAK 5IN (CATHETERS) ×2 IMPLANT
CEMENT HV SMART SET (Cement) ×4 IMPLANT
CLOTH BEACON ORANGE TIMEOUT ST (SAFETY) ×2 IMPLANT
CUFF TOURN SGL QUICK 34 (TOURNIQUET CUFF) ×2
CUFF TRNQT CYL 34X4X40X1 (TOURNIQUET CUFF) ×1 IMPLANT
DRAPE EXTREMITY T 121X128X90 (DRAPE) ×2 IMPLANT
DRAPE POUCH INSTRU U-SHP 10X18 (DRAPES) ×2 IMPLANT
DRAPE U-SHAPE 47X51 STRL (DRAPES) ×3 IMPLANT
DRSG ADAPTIC 3X8 NADH LF (GAUZE/BANDAGES/DRESSINGS) ×2 IMPLANT
DURAPREP 26ML APPLICATOR (WOUND CARE) ×2 IMPLANT
ELECT REM PT RETURN 9FT ADLT (ELECTROSURGICAL) ×2
ELECTRODE REM PT RTRN 9FT ADLT (ELECTROSURGICAL) ×1 IMPLANT
EVACUATOR 1/8 PVC DRAIN (DRAIN) ×2 IMPLANT
FACESHIELD LNG OPTICON STERILE (SAFETY) ×10 IMPLANT
GLOVE BIO SURGEON STRL SZ7.5 (GLOVE) ×2 IMPLANT
GLOVE BIO SURGEON STRL SZ8 (GLOVE) ×2 IMPLANT
GLOVE BIOGEL PI IND STRL 8 (GLOVE) ×2 IMPLANT
GLOVE BIOGEL PI INDICATOR 8 (GLOVE) ×2
GOWN STRL NON-REIN LRG LVL3 (GOWN DISPOSABLE) ×2 IMPLANT
GOWN STRL REIN XL XLG (GOWN DISPOSABLE) ×2 IMPLANT
HANDPIECE INTERPULSE COAX TIP (DISPOSABLE) ×2
IMMOBILIZER KNEE 20 (SOFTGOODS) ×2
IMMOBILIZER KNEE 20 THIGH 36 (SOFTGOODS) ×1 IMPLANT
KIT BASIN OR (CUSTOM PROCEDURE TRAY) ×2 IMPLANT
MANIFOLD NEPTUNE II (INSTRUMENTS) ×2 IMPLANT
NS IRRIG 1000ML POUR BTL (IV SOLUTION) ×2 IMPLANT
PACK TOTAL JOINT (CUSTOM PROCEDURE TRAY) ×2 IMPLANT
PAD ABD 7.5X8 STRL (GAUZE/BANDAGES/DRESSINGS) ×2 IMPLANT
PADDING CAST COTTON 6X4 STRL (CAST SUPPLIES) ×6 IMPLANT
POSITIONER SURGICAL ARM (MISCELLANEOUS) ×2 IMPLANT
SET HNDPC FAN SPRY TIP SCT (DISPOSABLE) ×1 IMPLANT
SPONGE GAUZE 4X4 12PLY (GAUZE/BANDAGES/DRESSINGS) ×2 IMPLANT
STRIP CLOSURE SKIN 1/2X4 (GAUZE/BANDAGES/DRESSINGS) ×4 IMPLANT
SUCTION FRAZIER 12FR DISP (SUCTIONS) ×2 IMPLANT
SUT MNCRL AB 4-0 PS2 18 (SUTURE) ×2 IMPLANT
SUT PDS AB 1 CT1 27 (SUTURE) ×6 IMPLANT
SUT VIC AB 2-0 CT1 27 (SUTURE) ×6
SUT VIC AB 2-0 CT1 TAPERPNT 27 (SUTURE) ×3 IMPLANT
SUT VLOC 180 0 24IN GS25 (SUTURE) ×2 IMPLANT
TOWEL OR 17X26 10 PK STRL BLUE (TOWEL DISPOSABLE) ×4 IMPLANT
TRAY FOLEY CATH 14FRSI W/METER (CATHETERS) ×2 IMPLANT
WATER STERILE IRR 1500ML POUR (IV SOLUTION) ×2 IMPLANT
WRAP KNEE MAXI GEL POST OP (GAUZE/BANDAGES/DRESSINGS) ×4 IMPLANT

## 2011-09-03 NOTE — Anesthesia Postprocedure Evaluation (Signed)
  Anesthesia Post-op Note  Patient: Samantha Bishop  Procedure(s) Performed: Procedure(s) (LRB): TOTAL KNEE ARTHROPLASTY (Left)  Patient Location: PACU  Anesthesia Type: Spinal  Level of Consciousness: awake and alert   Airway and Oxygen Therapy: Patient Spontanous Breathing  Post-op Pain: mild  Post-op Assessment: Post-op Vital signs reviewed, Patient's Cardiovascular Status Stable, Respiratory Function Stable, Patent Airway and No signs of Nausea or vomiting  Post-op Vital Signs: stable  Complications: No apparent anesthesia complications

## 2011-09-03 NOTE — Anesthesia Preprocedure Evaluation (Signed)
Anesthesia Evaluation  Patient identified by MRN, date of birth, ID band Patient awake    Reviewed: Allergy & Precautions, H&P , NPO status , Patient's Chart, lab work & pertinent test results, reviewed documented beta blocker date and time   Airway Mallampati: II TM Distance: >3 FB Neck ROM: full    Dental  (+) Caps and Dental Advisory Given Many caps.  Patient does not know which are capped.:   Pulmonary sleep apnea ,  Stop bang 4.  Hx. Pulmonary embolus breath sounds clear to auscultation  Pulmonary exam normal       Cardiovascular hypertension, + dysrhythmias Atrial Fibrillation Rhythm:regular Rate:Normal  tachycardia   Neuro/Psych Unruptured cerebral aneurysm negative psych ROS   GI/Hepatic negative GI ROS, Neg liver ROS, GERD-  Medicated and Controlled,  Endo/Other  negative endocrine ROS  Renal/GU negative Renal ROS  negative genitourinary   Musculoskeletal   Abdominal   Peds  Hematology negative hematology ROS (+)   Anesthesia Other Findings   Reproductive/Obstetrics negative OB ROS                           Anesthesia Physical Anesthesia Plan  ASA: III  Anesthesia Plan: Spinal   Post-op Pain Management:    Induction:   Airway Management Planned: Simple Face Mask  Additional Equipment:   Intra-op Plan:   Post-operative Plan:   Informed Consent: I have reviewed the patients History and Physical, chart, labs and discussed the procedure including the risks, benefits and alternatives for the proposed anesthesia with the patient or authorized representative who has indicated his/her understanding and acceptance.   Dental Advisory Given  Plan Discussed with: CRNA and Surgeon  Anesthesia Plan Comments:         Anesthesia Quick Evaluation

## 2011-09-03 NOTE — Op Note (Signed)
Pre-operative diagnosis- Osteoarthritis  Left knee(s)  Post-operative diagnosis- Osteoarthritis Left knee(s)  Procedure-  Left  Total Knee Arthroplasty  Surgeon- Samantha Rankin. Lela Murfin, MD  Assistant- Avel Peace, PA-C   Anesthesia-  Spinal EBL-* No blood loss amount entered *  Drains Hemovac  Tourniquet time- 32 minutes @ 300 mm Hg  Complications- None  Condition-PACU - hemodynamically stable.   Brief Clinical Note  Samantha Bishop is a 78 y.o. year old female with end stage OA of her left knee with progressively worsening pain and dysfunction. She has constant pain, with activity and at rest and significant functional deficits with difficulties even with ADLs. She has had extensive non-op management including analgesics, injections of cortisone and viscosupplements, and home exercise program, but remains in significant pain with significant dysfunction. Radiographs show bone on bone arthritis medial and patellofemoral with large medial osteophytes.. She presents now for left Total Knee Arthroplasty.    Procedure in detail---   The patient is brought into the operating room and positioned supine on the operating table. After successful administration of  Spinal,   a tourniquet is placed high on the  Left thigh(s) and the lower extremity is prepped and draped in the usual sterile fashion. Time out is performed by the operating team and then the  Left lower extremity is wrapped in Esmarch, knee flexed and the tourniquet inflated to 300 mmHg.       A midline incision is made with a ten blade through the subcutaneous tissue to the level of the extensor mechanism. A fresh blade is used to make a medial parapatellar arthrotomy. Soft tissue over the proximal medial tibia is subperiosteally elevated to the joint line with a knife and into the semimembranosus bursa with a Cobb elevator. Soft tissue over the proximal lateral tibia is elevated with attention being paid to avoiding the patellar tendon on  the tibial tubercle. The patella is everted, knee flexed 90 degrees and the ACL and PCL are removed. Findings are bone on bone medial and patellofemoral with large medial osteophytes.        The drill is used to create a starting hole in the distal femur and the canal is thoroughly irrigated with sterile saline to remove the fatty contents. The 5 degree Left  valgus alignment guide is placed into the femoral canal and the distal femoral cutting block is pinned to remove 11 mm off the distal femur. Resection is made with an oscillating saw.      The tibia is subluxed forward and the menisci are removed. The extramedullary alignment guide is placed referencing proximally at the medial aspect of the tibial tubercle and distally along the second metatarsal axis and tibial crest. The block is pinned to remove 2mm off the more deficient medial  side. Resection is made with an oscillating saw. Size 3is the most appropriate size for the tibia and the proximal tibia is prepared with the modular drill and keel punch for that size.      The femoral sizing guide is placed and size 2.5 is most appropriate. Rotation is marked off the epicondylar axis and confirmed by creating a rectangular flexion gap at 90 degrees. The size 2.5 cutting block is pinned in this rotation and the anterior, posterior and chamfer cuts are made with the oscillating saw. The intercondylar block is then placed and that cut is made.      Trial size 3 tibial component, trial size 2.5 posterior stabilized femur and a 12.5  mm  posterior stabilized rotating platform insert trial is placed. Full extension is achieved with excellent varus/valgus and anterior/posterior balance throughout full range of motion. The patella is everted and thickness measured to be 22  mm. Free hand resection is taken to 12 mm, a 38 template is placed, lug holes are drilled, trial patella is placed, and it tracks normally. Osteophytes are removed off the posterior femur with the  trial in place. All trials are removed and the cut bone surfaces prepared with pulsatile lavage. Cement is mixed and once ready for implantation, the size 3 tibial implant, size  2.5 posterior stabilized femoral component, and the size 38 patella are cemented in place and the patella is held with the clamp. The trial insert is placed and the knee held in full extension. All extruded cement is removed and once the cement is hard the permanent 12.5 mm posterior stabilized rotating platform insert is placed into the tibial tray.      The wound is copiously irrigated with saline solution and the extensor mechanism closed over a hemovac drain with #1 PDS suture. The tourniquet is released for a total tourniquet time of 32  minutes. Flexion against gravity is 135 degrees and the patella tracks normally. Subcutaneous tissue is closed with 2.0 vicryl and subcuticular with running 4.0 Monocryl. The catheter for the Marcaine pain pump is placed and the pump is initiated. The incision is cleaned and dried and steri-strips and a bulky sterile dressing are applied. The limb is placed into a knee immobilizer and the patient is awakened and transported to recovery in stable condition.      Please note that a surgical assistant was a medical necessity for this procedure in order to perform it in a safe and expeditious manner. Surgical assistant was necessary to retract the ligaments and vital neurovascular structures to prevent injury to them and also necessary for proper positioning of the limb to allow for anatomic placement of the prosthesis.   Samantha Rankin Emaad Nanna, MD    09/03/2011, 2:09 PM

## 2011-09-03 NOTE — Plan of Care (Signed)
Problem: Phase I Progression Outcomes Goal: Dangle evening of surgery Outcome: Not Met (add Reason) Pt very nauseated after surgery and unable to dangle as of 2304.

## 2011-09-03 NOTE — Interval H&P Note (Signed)
History and Physical Interval Note:  09/03/2011 12:35 PM  Samantha Bishop  has presented today for surgery, with the diagnosis of Osteoarthritis of the Left Knee  The various methods of treatment have been discussed with the patient and family. After consideration of risks, benefits and other options for treatment, the patient has consented to  Procedure(s) (LRB): TOTAL KNEE ARTHROPLASTY (Left) as a surgical intervention .  The patients' history has been reviewed, patient examined, no change in status, stable for surgery.  I have reviewed the patients' chart and labs.  Questions were answered to the patient's satisfaction.     Loanne Drilling

## 2011-09-03 NOTE — Anesthesia Procedure Notes (Addendum)
Performed by: Anastasio Champion E    Spinal   Spinal  Start time: 09/03/2011 1:15 PM End time: 09/03/2011 1:20 PM Staffing Anesthesiologist: Ronelle Nigh L Performed by: anesthesiologist  Preanesthetic Checklist Completed: patient identified, site marked, surgical consent, timeout performed, IV checked, risks and benefits discussed and monitors and equipment checked Spinal Block Patient position: sitting Prep: Betadine Patient monitoring: continuous pulse ox and blood pressure Approach: midline Location: L3-4 Injection technique: single-shot Needle Needle gauge: 22 G Needle length: 10 cm Additional Notes Lot # [id number]  01/2013  Anesthesia Regional Block:   Narrative:

## 2011-09-03 NOTE — Transfer of Care (Signed)
Immediate Anesthesia Transfer of Care Note  Patient: Samantha Bishop  Procedure(s) Performed: Procedure(s) (LRB): TOTAL KNEE ARTHROPLASTY (Left)  Patient Location: PACU  Anesthesia Type: Regional  Level of Consciousness: awake, alert , oriented, patient cooperative and responds to stimulation  Airway & Oxygen Therapy: Patient Spontanous Breathing and Patient connected to face mask oxygen  Post-op Assessment: Report given to PACU RN and Post -op Vital signs reviewed and stable  Post vital signs: stable  Complications: No apparent anesthesia complications  L3 motor S1 sensory

## 2011-09-04 LAB — BASIC METABOLIC PANEL
BUN: 7 mg/dL (ref 6–23)
CO2: 28 mEq/L (ref 19–32)
Calcium: 8.3 mg/dL — ABNORMAL LOW (ref 8.4–10.5)
Chloride: 101 mEq/L (ref 96–112)
Creatinine, Ser: 0.55 mg/dL (ref 0.50–1.10)
GFR calc Af Amer: >90 mL/min (ref >90)
GFR calc non Af Amer: 84 mL/min — ABNORMAL LOW (ref >90)
Glucose, Bld: 136 mg/dL — ABNORMAL HIGH (ref 70–99)
Potassium: 3.9 mEq/L (ref 3.5–5.1)
Sodium: 135 mEq/L (ref 135–145)

## 2011-09-04 LAB — CBC
HCT: 33.6 % — ABNORMAL LOW (ref 36.0–46.0)
Hemoglobin: 10.9 g/dL — ABNORMAL LOW (ref 12.0–15.0)
MCH: 29.5 pg (ref 26.0–34.0)
MCHC: 32.4 g/dL (ref 30.0–36.0)
MCV: 90.8 fL (ref 78.0–100.0)
Platelets: 176 10*3/uL (ref 150–400)
RBC: 3.7 MIL/uL — ABNORMAL LOW (ref 3.87–5.11)
RDW: 13.3 % (ref 11.5–15.5)
WBC: 8.3 10*3/uL (ref 4.0–10.5)

## 2011-09-04 MED ORDER — BISACODYL 5 MG PO TBEC
10.0000 mg | DELAYED_RELEASE_TABLET | Freq: Every day | ORAL | Status: DC | PRN
  Administered 2011-09-04: 10 mg via ORAL

## 2011-09-04 NOTE — Progress Notes (Signed)
09/04/11 1300  PT Visit Information  Last PT Received On 09/04/11  Assistance Needed +1  PT Time Calculation  PT Start Time 1330  PT Stop Time 1352  PT Time Calculation (min) 22 min  Subjective Data  Subjective It hurts!  Precautions  Precautions Knee  Precaution Comments no pillow under knee  Cognition  Overall Cognitive Status Appears within functional limits for tasks assessed/performed  Arousal/Alertness Awake/alert  Orientation Level Appears intact for tasks assessed  Behavior During Session [facility] for tasks performed  Total Joint Exercises  Ankle Circles/Pumps AROM;10 reps;Both  Quad Sets AROM;Left;10 reps  Heel Slides AAROM;PROM;Left;10 reps;Limitations  Hip ABduction/ADduction AAROM;Left;10 reps  Straight Leg Raises AAROM;Left;10 reps  Heel Slides Limitations much difficulty with knee flexion due to muscle guarding and pain; knee flexion stretch with roll under knee to Increase ROM  PT - End of Session  Activity Tolerance Patient limited by pain  Patient left in bed;with call bell/phone within reach  Nurse Communication Patient requests pain meds  PT - Assessment/Plan  Comments on Treatment Session pt with c/o of severe left knee pain not rated, RN notified, pt was premedicated;  Ice to left knee; pt put back to bed by nursing staff  PT Plan Discharge plan remains appropriate;Frequency remains appropriate  PT Frequency 7X/week  Follow Up Recommendations Skilled nursing facility  Equipment Recommended Defer to next venue  Acute Rehab PT Goals  Time For Goal Achievement 09/09/11  Potential to Achieve Goals Good  Pt will Perform Home Exercise Program with min assist  PT Goal: Perform Home Exercise Program - Progress Progressing toward goal

## 2011-09-04 NOTE — Progress Notes (Signed)
Subjective: 1 Day Post-Op Procedure(s) (LRB): TOTAL KNEE ARTHROPLASTY (Left) Patient reports pain as mild.   Patient is well, and has had no acute complaints or problems We will start therapy today.  Plan is to go Skilled nursing facility after hospital stay.  Objective: Vital signs in last 24 hours: Temp:  [96.9 F (36.1 C)-98.2 F (36.8 C)] 98.2 F (36.8 C) (04/27 0530) Pulse Rate:  [57-97] 85  (04/27 0530) Resp:  [12-20] 16  (04/27 0530) BP: (138-171)/(69-99) 144/85 mmHg (04/27 0530) SpO2:  [95 %-100 %] 100 % (04/27 0530) Weight:  [81.194 kg (179 lb)] 81.194 kg (179 lb) (04/26 1600)  Intake/Output from previous day:  Intake/Output Summary (Last 24 hours) at 09/04/11 0828 Last data filed at 09/04/11 0640  Gross per 24 hour  Intake   3898 ml  Output   3040 ml  Net    858 ml    Intake/Output this shift:    Labs:  Basename 09/04/11 0445  HGB 10.9*    Basename 09/04/11 0445  WBC 8.3  RBC 3.70*  HCT 33.6*  PLT 176    Basename 09/04/11 0445  NA 135  K 3.9  CL 101  CO2 28  BUN 7  CREATININE 0.55  GLUCOSE 136*  CALCIUM 8.3*   No results found for this basename: LABPT:2,INR:2 in the last 72 hours  EXAM General - Patient is Alert, Appropriate and Oriented Extremity - Neurologically intact Dorsiflexion/Plantar flexion intact Compartment soft Dressing - dressing C/D/I Motor Function - intact, moving foot and toes well on exam.  Hemovac pulled without difficulty.  Past Medical History  Diagnosis Date  . HTN (hypertension)   . Atrial fibrillation     managed with rate control and anticoagulation.   . Tachycardia, unspecified   . Pulmonary emboli     following remote knee arthroscopy surgery  . Hyperlipidemia   . Diverticulosis of colon (without mention of hemorrhage)   . Benign neoplasm of colon   . Unspecified hemorrhoids without mention of complication   . Calculus of kidney   . Osteoarthrosis, unspecified whether generalized or localized,  unspecified site   . Lumbago   . Cerebral aneurysm, nonruptured   . Anxiety state, unspecified   . Persistent disorder of initiating or maintaining sleep   . Chronic anticoagulation     on Pradaxa  . Atrial tachycardia     in the remote past with remote ablation  . UTI (urinary tract infection)     CURRENTLY BEING TX FOR UTI  . Esophageal reflux     YRS AGO - NO PROBLEM NOW  . History of skin cancer   . Ruptured lumbar disc   . Complication of anesthesia     "my heart stopped" 2004 knee  surg - see note on chart  . Sleep apnea     stop bang score 4     Assessment/Plan: 1 Day Post-Op Procedure(s) (LRB): TOTAL KNEE ARTHROPLASTY (Left) Principal Problem:  *OA (osteoarthritis) of knee   Advance diet Up with therapy Discharge to SNF when medically ready  DVT Prophylaxis - Xarelto Weight-Bearing as tolerated to left leg Keep foley until tomorrow. No vaccines. D/C PCA, Change to IV push D/C O2 and Pulse OX and try on Room Air  Kiarrah Rausch V 09/04/2011, 8:28 AM

## 2011-09-04 NOTE — Evaluation (Signed)
Physical Therapy Evaluation Patient Details Name: Samantha Bishop MRN: [id number] DOB: 07-06-1924 Today's Date: 09/04/2011 Time: 7829-5621 PT Time Calculation (min): 33 min  PT Assessment / Plan / Recommendation Clinical Impression  pt is s/p L TKA and will benefit from PT to maximize independence for next venue of care, pt planning for SNF    PT Assessment  Patient needs continued PT services    Follow Up Recommendations  Skilled nursing facility    Equipment Recommendations  Defer to next venue    Frequency 7X/week    Precautions / Restrictions Precautions Precautions: Knee Precaution Comments: no pillow under knee Required Braces or Orthoses: Knee Immobilizer - Left Knee Immobilizer - Left: Discontinue once straight leg raise with < 10 degree lag WBAT LLE   Pertinent Vitals/Pain       Mobility  Bed Mobility Bed Mobility: Sit to Supine Sit to Supine: 3: Mod assist Details for Bed Mobility Assistance: cues for technique Transfers Transfers: Sit to Stand;Stand to Sit Sit to Stand: 3: Mod assist;With upper extremity assist;From bed Stand to Sit: 4: Min assist;3: Mod assist;With armrests;With upper extremity assist;To chair/3-in-1 Details for Transfer Assistance: cues for hand placement, wt shift and LLE position Ambulation/Gait Ambulation/Gait Assistance: 4: Min assist;3: Mod assist Ambulation Distance (Feet): 20 Feet Assistive device: Rolling walker Ambulation/Gait Assistance Details: cues for sequence, breathing, posture Gait Pattern: Step-to pattern;Antalgic;Decreased stance time - left    Exercises Total Joint Exercises Ankle Circles/Pumps: AROM;10 reps;Both   PT Goals Acute Rehab PT Goals PT Goal Formulation: With patient Time For Goal Achievement: 09/09/11 Potential to Achieve Goals: Good Pt will go Supine/Side to Sit: with supervision PT Goal: Supine/Side to Sit - Progress: Goal set today Pt will go Sit to Supine/Side: with supervision PT Goal: Sit  to Supine/Side - Progress: Goal set today Pt will go Sit to Stand: with supervision PT Goal: Sit to Stand - Progress: Goal set today Pt will go Stand to Sit: with supervision PT Goal: Stand to Sit - Progress: Goal set today Pt will Ambulate: 51 - 150 feet;with supervision;with rolling walker PT Goal: Ambulate - Progress: Goal set today Pt will Perform Home Exercise Program: with min assist PT Goal: Perform Home Exercise Program - Progress: Goal set today  Visit Information  Last PT Received On: 09/04/11    Subjective Data  Subjective: It hurts! Patient Stated Goal: to decrease pain   Prior Functioning  Home Living Lives With: Alone Additional Comments: plans SNF post acute, prefers [facility] Prior Function Level of Independence: Independent Able to Take Stairs?: Yes Driving: Yes    Cognition  Overall Cognitive Status: Appears within functional limits for tasks assessed/performed Arousal/Alertness: Awake/alert Orientation Level: Appears intact for tasks assessed Behavior During Session: [facility] for tasks performed    Extremity/Trunk Assessment Right Upper Extremity Assessment RUE ROM/Strength/Tone: Within functional levels Left Upper Extremity Assessment LUE ROM/Strength/Tone: Within functional levels Right Lower Extremity Assessment RLE ROM/Strength/Tone: Within functional levels Left Lower Extremity Assessment LLE ROM/Strength/Tone: Deficits;Due to pain LLE ROM/Strength/Tone Deficits: ankle WFL   Balance    End of Session PT - End of Session Equipment Utilized During Treatment: Gait belt;Left knee immobilizer Activity Tolerance: Patient limited by pain;Patient limited by fatigue Patient left: in chair;with call bell/phone within reach;with family/visitor present Nurse Communication: Mobility status   [facility] 09/04/2011, 10:44 AM

## 2011-09-05 LAB — BASIC METABOLIC PANEL
BUN: 6 mg/dL (ref 6–23)
CO2: 23 mEq/L (ref 19–32)
Calcium: 8.6 mg/dL (ref 8.4–10.5)
Chloride: 101 mEq/L (ref 96–112)
Creatinine, Ser: 0.55 mg/dL (ref 0.50–1.10)
GFR calc Af Amer: >90 mL/min (ref >90)
GFR calc non Af Amer: 84 mL/min — ABNORMAL LOW (ref >90)
Glucose, Bld: 134 mg/dL — ABNORMAL HIGH (ref 70–99)
Potassium: 3.6 mEq/L (ref 3.5–5.1)
Sodium: 134 mEq/L — ABNORMAL LOW (ref 135–145)

## 2011-09-05 LAB — CBC
HCT: 33.7 % — ABNORMAL LOW (ref 36.0–46.0)
Hemoglobin: 10.9 g/dL — ABNORMAL LOW (ref 12.0–15.0)
MCH: 29.3 pg (ref 26.0–34.0)
MCHC: 32.3 g/dL (ref 30.0–36.0)
MCV: 90.6 fL (ref 78.0–100.0)
Platelets: 149 10*3/uL — ABNORMAL LOW (ref 150–400)
RBC: 3.72 MIL/uL — ABNORMAL LOW (ref 3.87–5.11)
RDW: 13.6 % (ref 11.5–15.5)
WBC: 9.5 10*3/uL (ref 4.0–10.5)

## 2011-09-05 NOTE — Progress Notes (Signed)
Orthopedics Progress Note  Subjective: Pt resting with moderate soreness to knee today.  Objective:  Filed Vitals:   09/05/11 0511  BP: 148/86  Pulse: 94  Temp: 98.5 F (36.9 C)  Resp: 16    General: Awake and alert  Musculoskeletal: incision healing well, nv intact distally dressing changed Neurovascularly intact  Lab Results  Component Value Date   WBC 9.5 09/05/2011   HGB 10.9* 09/05/2011   HCT 33.7* 09/05/2011   MCV 90.6 09/05/2011   PLT 149* 09/05/2011       Component Value Date/Time   NA 134* 09/05/2011 0431   K 3.6 09/05/2011 0431   CL 101 09/05/2011 0431   CO2 23 09/05/2011 0431   GLUCOSE 134* 09/05/2011 0431   BUN 6 09/05/2011 0431   CREATININE 0.55 09/05/2011 0431   CALCIUM 8.6 09/05/2011 0431   GFRNONAA 84* 09/05/2011 0431   GFRAA >90 09/05/2011 0431    Lab Results  Component Value Date   INR 1.24 08/20/2011    Assessment/Plan: POD #2 s/p Procedure(s): TOTAL KNEE ARTHROPLASTY  PT/OT Pain control D/c planning  Viviann Spare R. Ranell Patrick, MD 09/05/2011 8:24 AM

## 2011-09-05 NOTE — Progress Notes (Signed)
Physical Therapy Treatment Patient Details Name: Samantha Bishop MRN: [id number] DOB: 05/26/1924 Today's Date: 09/05/2011 Time: 6213-0865 PT Time Calculation (min): 31 min  PT Assessment / Plan / Recommendation Comments on Treatment Session  Pt with increased pain in L knee and will need to be pre-medicated to tolerate therapy.  Pt assisted back to bed and performed a couple exercises.    Follow Up Recommendations  Skilled nursing facility    Equipment Recommendations  Defer to next venue    Frequency     Plan Discharge plan remains appropriate;Frequency remains appropriate    Precautions / Restrictions Precautions Precautions: Knee Precaution Comments: no pillow under knee Required Braces or Orthoses: Knee Immobilizer - Left Restrictions LLE Weight Bearing: Weight bearing as tolerated   Pertinent Vitals/Pain     Mobility  Bed Mobility Bed Mobility: Sit to Supine Supine to Sit: 4: Min assist;With rails Sit to Supine: 3: Mod assist Details for Bed Mobility Assistance: cues for technique, assist for LEs Transfers Transfers: Sit to Stand;Stand to Sit;Stand Pivot Transfers Sit to Stand: 3: Mod assist;With upper extremity assist;From chair/3-in-1;With armrests Stand to Sit: 3: Mod assist;With upper extremity assist;To bed;To elevated surface Stand Pivot Transfers: 3: Mod assist Details for Transfer Assistance: increased verbal cues for safe technique, assist for weakness and pain in L knee, cues for sequence and taking small steps back to bed   Exercises Total Joint Exercises Ankle Circles/Pumps: AROM;Both;20 reps;Supine Quad Sets: AROM;Left;10 reps;Supine Knee Flexion: PROM;Left;Supine (pillow under knee for 5 minutes for ROM 2* knee pain)   PT Goals Acute Rehab PT Goals PT Goal: Supine/Side to Sit - Progress: Progressing toward goal PT Goal: Sit to Supine/Side - Progress: Progressing toward goal PT Goal: Sit to Stand - Progress: Progressing toward goal PT Goal: Stand  to Sit - Progress: Progressing toward goal PT Goal: Ambulate - Progress: Progressing toward goal PT Goal: Perform Home Exercise Program - Progress: Progressing toward goal  Visit Information  Last PT Received On: 09/05/11 Assistance Needed: +1    Subjective Data  Subjective: "I'm hurting so much."  (could not recall pain meds so called RN to medicate)   Cognition  Overall Cognitive Status: Appears within functional limits for tasks assessed/performed    Balance     End of Session PT - End of Session Equipment Utilized During Treatment: Gait belt;Left knee immobilizer Activity Tolerance: Patient limited by pain Patient left: in bed;with call bell/phone within reach;with family/visitor present Nurse Communication: Patient requests pain meds    Nitish Roes,KATHrine E 09/05/2011, 3:46 PM Pager: [phone]

## 2011-09-05 NOTE — Progress Notes (Signed)
Physical Therapy Treatment Patient Details Name: ASHAUNTI TREPTOW MRN: [id number] DOB: 12/05/1924 Today's Date: 09/05/2011 Time: 4098-1191 PT Time Calculation (min): 26 min  PT Assessment / Plan / Recommendation Comments on Treatment Session  Pt reports increased pain with L weight bearing and required increased cues for safe ambulation technique.  Pt assisted to [facility] prior to ambulation.  Pt reports she would like to do exercises in afternoon due to pain this morning after gait.    Follow Up Recommendations  Skilled nursing facility    Equipment Recommendations  Defer to next venue    Frequency     Plan Discharge plan remains appropriate;Frequency remains appropriate    Precautions / Restrictions Precautions Precautions: Knee Precaution Comments: no pillow under knee Restrictions LLE Weight Bearing: Weight bearing as tolerated   Pertinent Vitals/Pain     Mobility  Bed Mobility Bed Mobility: Supine to Sit Supine to Sit: 4: Min assist;With rails Details for Bed Mobility Assistance: cues for technique, assist for L LE Transfers Transfers: Sit to Stand;Stand to Sit;Stand Pivot Transfers Sit to Stand: 3: Mod assist;With upper extremity assist;From bed Stand to Sit: 4: Min assist;3: Mod assist;With armrests;With upper extremity assist;To chair/3-in-1 Stand Pivot Transfers: 3: Mod assist Details for Transfer Assistance: cues for hand placement, L LE forward, smaller steps and keeping RW close Ambulation/Gait Ambulation/Gait Assistance: 4: Min assist;3: Mod assist Ambulation Distance (Feet): 25 Feet Assistive device: Rolling walker Ambulation/Gait Assistance Details: pt modA beginning ambulation 2* poor technique however with increased verbal cues for smaller steps and close RW pt became minA Gait Pattern: Step-to pattern;Antalgic;Decreased stance time - left    Exercises     PT Goals Acute Rehab PT Goals PT Goal: Supine/Side to Sit - Progress: Progressing toward goal PT  Goal: Sit to Stand - Progress: Progressing toward goal PT Goal: Stand to Sit - Progress: Progressing toward goal PT Goal: Ambulate - Progress: Progressing toward goal  Visit Information  Last PT Received On: 09/05/11 Assistance Needed: +1    Subjective Data  Subjective: "I need to use the bathroom"   Cognition  Overall Cognitive Status: Appears within functional limits for tasks assessed/performed    Balance     End of Session PT - End of Session Equipment Utilized During Treatment: Gait belt;Left knee immobilizer Activity Tolerance: Patient limited by pain Patient left: in chair;with call bell/phone within reach Nurse Communication: Patient requests pain meds    Rondo Spittler,KATHrine E 09/05/2011, 1:07 PM Pager: [phone]

## 2011-09-05 NOTE — Progress Notes (Signed)
OT Cancellation Note  Treatment cancelled today due to patient's refusal to participate, pt had just completed PT and reports fatigue.  Evern Bio 09/05/2011, 11:10 AM

## 2011-09-06 DIAGNOSIS — D62 Acute posthemorrhagic anemia: Secondary | ICD-10-CM | POA: Diagnosis not present

## 2011-09-06 DIAGNOSIS — Z96659 Presence of unspecified artificial knee joint: Secondary | ICD-10-CM | POA: Diagnosis not present

## 2011-09-06 DIAGNOSIS — I10 Essential (primary) hypertension: Secondary | ICD-10-CM | POA: Diagnosis not present

## 2011-09-06 DIAGNOSIS — M25569 Pain in unspecified knee: Secondary | ICD-10-CM | POA: Diagnosis not present

## 2011-09-06 DIAGNOSIS — IMO0002 Reserved for concepts with insufficient information to code with codable children: Secondary | ICD-10-CM | POA: Diagnosis not present

## 2011-09-06 DIAGNOSIS — E785 Hyperlipidemia, unspecified: Secondary | ICD-10-CM | POA: Diagnosis not present

## 2011-09-06 DIAGNOSIS — Z7901 Long term (current) use of anticoagulants: Secondary | ICD-10-CM | POA: Diagnosis not present

## 2011-09-06 DIAGNOSIS — T8140XA Infection following a procedure, unspecified, initial encounter: Secondary | ICD-10-CM | POA: Diagnosis not present

## 2011-09-06 DIAGNOSIS — I4891 Unspecified atrial fibrillation: Secondary | ICD-10-CM | POA: Diagnosis not present

## 2011-09-06 DIAGNOSIS — K5900 Constipation, unspecified: Secondary | ICD-10-CM | POA: Diagnosis not present

## 2011-09-06 DIAGNOSIS — M1710 Unilateral primary osteoarthritis, unspecified knee: Secondary | ICD-10-CM | POA: Diagnosis not present

## 2011-09-06 DIAGNOSIS — L539 Erythematous condition, unspecified: Secondary | ICD-10-CM | POA: Diagnosis not present

## 2011-09-06 DIAGNOSIS — M1990 Unspecified osteoarthritis, unspecified site: Secondary | ICD-10-CM | POA: Diagnosis not present

## 2011-09-06 DIAGNOSIS — N390 Urinary tract infection, site not specified: Secondary | ICD-10-CM | POA: Diagnosis not present

## 2011-09-06 DIAGNOSIS — Z5189 Encounter for other specified aftercare: Secondary | ICD-10-CM | POA: Diagnosis not present

## 2011-09-06 DIAGNOSIS — M7989 Other specified soft tissue disorders: Secondary | ICD-10-CM | POA: Diagnosis not present

## 2011-09-06 DIAGNOSIS — E871 Hypo-osmolality and hyponatremia: Secondary | ICD-10-CM | POA: Diagnosis not present

## 2011-09-06 LAB — CBC
HCT: 30.1 % — ABNORMAL LOW (ref 36.0–46.0)
Hemoglobin: 9.8 g/dL — ABNORMAL LOW (ref 12.0–15.0)
MCH: 29.4 pg (ref 26.0–34.0)
MCHC: 32.6 g/dL (ref 30.0–36.0)
MCV: 90.4 fL (ref 78.0–100.0)
Platelets: 143 10*3/uL — ABNORMAL LOW (ref 150–400)
RBC: 3.33 MIL/uL — ABNORMAL LOW (ref 3.87–5.11)
RDW: 13.6 % (ref 11.5–15.5)
WBC: 9.7 10*3/uL (ref 4.0–10.5)

## 2011-09-06 MED ORDER — BISACODYL 5 MG PO TBEC: 10.0000 mg | DELAYED_RELEASE_TABLET | Freq: Every day | ORAL | Status: AC | PRN

## 2011-09-06 MED ORDER — ACETAMINOPHEN 325 MG PO TABS: 650.0000 mg | ORAL_TABLET | Freq: Four times a day (QID) | ORAL | Status: AC | PRN

## 2011-09-06 MED ORDER — HYDROMORPHONE HCL 2 MG PO TABS: 2.0000 mg | ORAL_TABLET | ORAL | Status: AC | PRN

## 2011-09-06 MED ORDER — POLYETHYLENE GLYCOL 3350 17 G PO PACK: 17.0000 g | PACK | Freq: Every day | ORAL | Status: AC | PRN

## 2011-09-06 MED ORDER — DSS 100 MG PO CAPS: 100.0000 mg | ORAL_CAPSULE | Freq: Two times a day (BID) | ORAL | Status: AC

## 2011-09-06 MED ORDER — BISACODYL 10 MG RE SUPP: 10.0000 mg | Freq: Every day | RECTAL | Status: AC | PRN

## 2011-09-06 MED ORDER — ONDANSETRON HCL 4 MG PO TABS: 4.0000 mg | ORAL_TABLET | Freq: Four times a day (QID) | ORAL | Status: AC | PRN

## 2011-09-06 MED ORDER — RIVAROXABAN 10 MG PO TABS: 10.0000 mg | ORAL_TABLET | Freq: Every day | ORAL | Status: DC

## 2011-09-06 MED ORDER — METHOCARBAMOL 500 MG PO TABS: 500.0000 mg | ORAL_TABLET | Freq: Four times a day (QID) | ORAL | Status: AC | PRN

## 2011-09-06 NOTE — Progress Notes (Signed)
Clinical Social Work Department BRIEF PSYCHOSOCIAL ASSESSMENT 09/06/2011  Patient:  Samantha Bishop, Samantha Bishop     Account Number:  [id number]     Admit date:  09/03/2011  Clinical Social Worker:  Candie Chroman  Date/Time:  09/06/2011 01:18 PM  Referred by:  Physician  Date Referred:  09/06/2011 Referred for  SNF Placement   Other Referral:   Interview type:  Patient Other interview type:    PSYCHOSOCIAL DATA Living Status:  ALONE Admitted from facility:   Level of care:   Primary support name:   Primary support relationship to patient:  CHILD, ADULT Degree of support available:    CURRENT CONCERNS Current Concerns  Post-Acute Placement   Other Concerns:    SOCIAL WORK ASSESSMENT / PLAN Pt is an 78 yr old female living alone prior to hospitalization. Met with pt/daughter to assist with d/c planning needs. PT has [facility]ened [facility] and pt/family are agreeable with plan. They have contacted [facility] and are expecting d/c to that facility. SNF has confirmed planned.   Assessment/plan status:  Psychosocial Support/Ongoing Assessment of Needs Other assessment/ plan:   Information/referral to community resources:    PATIENT'S/FAMILY'S RESPONSE TO PLAN OF CARE: Pt is expecting d/c to [facility].     Samantha Razor LCSW [phone]

## 2011-09-06 NOTE — Progress Notes (Signed)
Clinical Social Work Department CLINICAL SOCIAL WORK PLACEMENT NOTE 09/06/2011  Patient:  Samantha Bishop, Samantha Bishop  Account Number:  [id number] Admit date:  09/03/2011  Clinical Social Worker:  Cori Razor, LCSW  Date/time:  09/06/2011 01:27 PM  Clinical Social Work is seeking post-discharge placement for this patient at the following level of care:   SKILLED NURSING   (*CSW will update this form in Epic as items are completed)   09/06/2011  Patient/family provided with [facility] Department of Clinical Social Work's list of facilities offering this level of care within the geographic area requested by the patient (or if unable, by the patient's family).  09/06/2011  Patient/family informed of their freedom to choose among providers that offer the needed level of care, that participate in Medicare, Medicaid or managed care program needed by the patient, have an available bed and are willing to accept the patient.    Patient/family informed of [facility]' ownership interest in [facility], as well as of the fact that they are under no obligation to receive care at this facility.  PASARR submitted to EDS on 09/06/2011 PASARR number received from EDS on 09/06/2011  FL2 transmitted to all facilities in geographic area requested by pt/family on  09/06/2011 FL2 transmitted to all facilities within larger geographic area on   Patient informed that his/her managed care company has contracts with or will negotiate with  certain facilities, including the following:     Patient/family informed of bed offers received:  09/06/2011 Patient chooses bed at  Physician recommends and patient chooses bed at    Patient to be transferred [facility] on  09/06/2011 Patient to be transferred to facility by [facility]  The following physician request were entered in Epic:   Additional Comments:  Cori Razor  LCSW  [phone]

## 2011-09-06 NOTE — Clinical Documentation Improvement (Signed)
Anemia Blood Loss Clarification  THIS DOCUMENT IS NOT A PERMANENT PART OF THE MEDICAL RECORD  RESPOND TO THE THIS QUERY, FOLLOW THE INSTRUCTIONS BELOW:  1. If needed, update documentation for the patient's encounter via the notes activity.  2. Access this query again and click edit on the In [employer].  3. After updating, or not, click F2 to complete all highlighted (required) fields concerning your review. Select "additional documentation in the medical record" OR "no additional documentation provided".  4. Click Sign note button.  5. The deficiency will fall out of your In Basket *Please let us know if you are not able to complete this workflow by phone or e-mail (listed below).        09/06/11  Dear Samantha Bishop, or Thea Gist, PA  Marton Redwood  In an effort to better capture your patient's severity of illness, reflect appropriate length of stay and utilization of resources, a review of the patient medical record has revealed the following indicators.    Based on your clinical judgment, please clarify and document in a progress note and/or discharge summary the clinical condition associated with the following supporting information:  In responding to this query please exercise your independent judgment.  The fact that a query is asked, does not imply that any particular answer is desired or expected.  Pt admitted with OA/ L TKA  According to lab pt's H/H=9.8/30.1 in setting of s/p L TKA.   Please clarify based on abnormal H/H whether or not can be further specified as one of the diagnoses listed below and document in pn or d/c summary.   Possible Clinical Conditions?   " Expected Acute Blood Loss Anemia  " Acute Blood Loss Anemia  " Acute on chronic blood loss anemia    " Other Condition________________  " Cannot Clinically Determine  Risk Factors: (recent surgery, pre op anemia, EBL in OR)  Supporting Information: OA/ L TKA/ Signs and Symptoms     Diagnostics: Component     Latest Ref Rng 09/04/2011 09/05/2011 09/06/2011  HGB     12.0 - 15.0 g/dL 08.6 (L) 57.8 (L) 9.8 (L)  HCT     36.0 - 46.0 % 33.6 (L) 33.7 (L) 30.1 (L)    Treatments: Monitoring CBC  Reviewed:  no additional documentation provided  Thank You,  Enis Slipper  RN, BSN, CCDS Clinical Documentation Specialist [facility] HIM Dept Pager: [phone] / E-mail: [email or web address]  Health Information Management [place]

## 2011-09-06 NOTE — Progress Notes (Signed)
Occupational Therapy Note Chart reviewed. Note plan is for SNF. Will defer OT eval to SNF. Judithann Sauger OTR/L [phone] 09/06/2011

## 2011-09-06 NOTE — Discharge Instructions (Signed)
Knee Rehabilitation, Guidelines Following Surgery Results after knee surgery are often greatly improved when you follow the exercise, range of motion and muscle strengthening exercises prescribed by your doctor. Safety measures are also important to protect the knee from further injury. Any time any of these exercises cause you to have increased pain or swelling in your knee joint, decrease the amount until you are comfortable again and slowly increase them. If you have problems or questions, call your caregiver or physical therapist for advice. HOME CARE INSTRUCTIONS   Remove items at home which could result in a fall. This includes throw rugs or furniture in walking pathways.   Continue medications as instructed.   You may shower or take tub baths when your staples or stitches are removed or as instructed.   Walk using crutches or walker as instructed.   Put weight on your legs and walk as much as is comfortable.   You may resume a sexual relationship in one month or when given the OK by your doctor.   Return to work as instructed by your doctor.   Do not drive a car for 6 weeks or as instructed.   Wear elastic stockings until instructed not to.   Make sure you keep all of your appointments after your operation with all of your doctors and caregivers.  RANGE OF MOTION AND STRENGTHENING EXERCISES Rehabilitation of the knee is important following a knee injury or an operation. After just a few days of immobilization, the muscles of the thigh which control the knee become weakened and shrink (atrophy). Knee exercises are designed to build up the tone and strength of the thigh muscles and to improve knee motion. Often times heat used for twenty to thirty minutes before working out will loosen up your tissues and help with improving the range of motion. These exercises can be done on a training (exercise) mat, on the floor, on a table or on a bed. Use what ever works the best and is most  comfortable for you Knee exercises include:  Leg Lifts - While your knee is still immobilized in a splint or cast, you can do straight leg raises. Lift the leg to 60 degrees, hold for 3 sec, and slowly lower the leg. Repeat 10-20 times 2-3 times daily. Perform this exercise against resistance later as your knee gets better.   Quad and Hamstring Sets - Tighten up the muscle on the front of the thigh (Quad) and hold for 5-10 sec. Repeat this 10-20 times hourly. Hamstring sets are done by pushing the foot backward against an object and holding for 5-10 sec. Repeat as with quad sets.  A rehabilitation program following serious knee injuries can speed recovery and prevent re-injury in the future due to weakened muscles. Contact your doctor or a physical therapist for more information on knee rehabilitation. MAKE SURE YOU:   Understand these instructions.   Will watch your condition.   Will get help right away if you are not doing well or get worse.  Document Released: 04/26/2005 Document Revised: 04/15/2011 Document Reviewed: 10/14/2006 [facility] Patient Information 2012 [place], [place].  Pick up stool softner and laxative for home. Do not submerge incision under water. May shower. Continue to use ice for pain and swelling from surgery.  Take for two and a half more weeks, then discontinue Xarelto. Patient may resume her Pradaxa once the Xarelto is completed.  When discharged from the skilled rehab facility, please have the facility set up the patient's Home Health  Physical Therapy prior to being released.  Also provide the patient with their medications at time of release from the facility to include their pain medication, the muscle relaxants, and their blood thinner medication.  If the patient is still at the rehab facility at time of follow up appointment, please also assist the patient in arranging follow up appointment in our office and any transportation needs.

## 2011-09-06 NOTE — Progress Notes (Deleted)
Clinical Social Work Department CLINICAL SOCIAL WORK PLACEMENT NOTE 09/06/2011  Patient:  Samantha Bishop  Account Number:  [id number] Admit date:  09/01/2011  Clinical Social Worker:  Cori Razor, LCSW  Date/time:  09/02/2011 04:30 PM  Clinical Social Work is seeking post-discharge placement for this patient at the following level of care:   SKILLED NURSING   (*CSW will update this form in Epic as items are completed)   09/02/2011  Patient/family provided with [facility] Department of Clinical Social Work's list of facilities offering this level of care within the geographic area requested by the patient (or if unable, by the patient's family).  09/02/2011  Patient/family informed of their freedom to choose among providers that offer the needed level of care, that participate in Medicare, Medicaid or managed care program needed by the patient, have an available bed and are willing to accept the patient.    Patient/family informed of [facility]' ownership interest in [facility], as well as of the fact that they are under no obligation to receive care at this facility.  PASARR submitted to EDS on 09/06/2011 PASARR number received from EDS on 09/06/2011  FL2 transmitted to all facilities in geographic area requested by pt/family on  09/02/2011 FL2 transmitted to all facilities within larger geographic area on   Patient informed that his/her managed care company has contracts with or will negotiate with  certain facilities, including the following:     Patient/family informed of bed offers received:  09/02/2011 Patient chooses bed at [facility] Physician recommends and patient chooses bed at    Patient to be transferred to [facility] on  09/06/2011 Patient to be transferred to facility by [facility]  The following physician request were entered in Epic:   Additional Comments: [facility] has made a bed offer pending Insurance authorization. 09/06/11 : [employer] has  authorized SNF placement.  Cori Razor LCSW  [phone]

## 2011-09-06 NOTE — Progress Notes (Signed)
CARE MANAGEMENT NOTE 09/06/2011  Patient:  Samantha Bishop, Samantha Bishop   Account Number:  [id number]  Date Initiated:  09/06/2011  Documentation initiated by:  Colleen Can  Subjective/Objective Assessment:   dx osteoarthritis left knee; total knee replacemnt     Action/Plan:   Plans aree for snf rehab   Anticipated DC Date:  09/06/2011   Anticipated DC Plan:  SKILLED NURSING FACILITY  In-house referral  Clinical Social Worker      DC Planning Services  CM consult      [facility] [place] Choice  NA   Choice offered to / List presented to:  NA   DME arranged  NA      DME agency  NA     HH arranged  NA      HH agency  NA   Status of service:  Completed, signed off Medicare Important Message given?  NA - LOS <3 / Initial given by admissions (If response is "NO", the following Medicare IM given date fields will be blank) Date Medicare IM given:   Date Additional Medicare IM given:    Discharge Disposition:  SKILLED NURSING FACILITY

## 2011-09-06 NOTE — Progress Notes (Signed)
Noted CM consult for Home Health however patient was pre-arranged with MD office prior to surgery for SNF plan. Pt d/c to SNF. Genella Rife [facility] 09/06/2011

## 2011-09-06 NOTE — Discharge Summary (Signed)
Physician Discharge Summary   Patient ID: Samantha Bishop MRN: [id number] DOB/AGE: Sep 11, 1924 78 y.o.  Admit date: 09/03/2011 Discharge date: 09/06/2011  Primary Diagnosis: Osteoarthritis Left Knee   Admission Diagnoses:  Past Medical History  Diagnosis Date  . HTN (hypertension)   . Atrial fibrillation     managed with rate control and anticoagulation.   . Tachycardia, unspecified   . Pulmonary emboli     following remote knee arthroscopy surgery  . Hyperlipidemia   . Diverticulosis of colon (without mention of hemorrhage)   . Benign neoplasm of colon   . Unspecified hemorrhoids without mention of complication   . Calculus of kidney   . Osteoarthrosis, unspecified whether generalized or localized, unspecified site   . Lumbago   . Cerebral aneurysm, nonruptured   . Anxiety state, unspecified   . Persistent disorder of initiating or maintaining sleep   . Chronic anticoagulation     on Pradaxa  . Atrial tachycardia     in the remote past with remote ablation  . UTI (urinary tract infection)     CURRENTLY BEING TX FOR UTI  . Esophageal reflux     YRS AGO - NO PROBLEM NOW  . History of skin cancer   . Ruptured lumbar disc   . Complication of anesthesia     "my heart stopped" 2004 knee  surg - see note on chart  . Sleep apnea     stop bang score 4    Discharge Diagnoses:   Principal Problem:  *OA (osteoarthritis) of knee Active Problems:  Postop Hyponatremia  Postop Acute blood loss anemia  Procedure:  Procedure(s) (LRB): TOTAL KNEE ARTHROPLASTY (Left)   Consults: None  HPI: Samantha Bishop is a 78 y.o. year old female with end stage OA of her left knee with progressively worsening pain and dysfunction. She has constant pain, with activity and at rest and significant functional deficits with difficulties even with ADLs. She has had extensive non-op management including analgesics, injections of cortisone and viscosupplements, and home exercise program, but remains  in significant pain with significant dysfunction. Radiographs show bone on bone arthritis medial and patellofemoral with large medial osteophytes.. She presents now for left Total Knee Arthroplasty.   Laboratory Data: Hospital Outpatient Visit on 08/20/2011  Component Date Value Range Status  . MRSA, PCR  08/20/2011 NEGATIVE  NEGATIVE Final  . Staphylococcus aureus  08/20/2011 POSITIVE* NEGATIVE Final   Comment:                                 The Xpert SA Assay (FDA                          approved for NASAL specimens                          only), is one component of                          a comprehensive surveillance                          program.  It is not intended                          to diagnose infection  nor to                          guide or monitor treatment.  . WBC (K/uL) 08/20/2011 7.4  4.0-10.5 Final  . RBC (MIL/uL) 08/20/2011 4.33  3.87-5.11 Final  . Hemoglobin (g/dL) 91/47/8295 62.1  30.8-65.7 Final  . HCT (%) 08/20/2011 39.3  36.0-46.0 Final  . MCV (fL) 08/20/2011 90.8  78.0-100.0 Final  . MCH (pg) 08/20/2011 29.6  26.0-34.0 Final  . MCHC (g/dL) 84/69/6295 28.4  13.2-44.0 Final  . RDW (%) 08/20/2011 13.6  11.5-15.5 Final  . Platelets (K/uL) 08/20/2011 196  150-400 Final  . Sodium (mEq/L) 08/20/2011 139  135-145 Final  . Potassium (mEq/L) 08/20/2011 3.5  3.5-5.1 Final  . Chloride (mEq/L) 08/20/2011 104  96-112 Final  . CO2 (mEq/L) 08/20/2011 26  19-32 Final  . Glucose, Bld (mg/dL) 03/06/2535 644* 03-47 Final  . BUN (mg/dL) 42/59/5638 13  7-56 Final  . Creatinine, Ser (mg/dL) 43/32/9518 8.41  6.60-6.30 Final  . Calcium (mg/dL) 16/05/930 9.2  3.5-57.3 Final  . Total Protein (g/dL) 22/06/5425 7.0  0.6-2.3 Final  . Albumin (g/dL) 76/28/3151 3.9  7.6-1.6 Final  . AST (U/L) 08/20/2011 37  0-37 Final  . ALT (U/L) 08/20/2011 44* 0-35 Final  . Alkaline Phosphatase (U/L) 08/20/2011 45  39-117 Final  . Total Bilirubin (mg/dL) 07/37/1062 0.7  6.9-4.8 Final  .  GFR calc non Af Amer (mL/min) 08/20/2011 81* >90 Final  . GFR calc Af Amer (mL/min) 08/20/2011 >90  >90 Final   Comment:                                 The eGFR has been calculated                          using the CKD EPI equation.                          This calculation has not been                          validated in all clinical                          situations.                          eGFR's persistently                          <90 mL/min signify                          possible Chronic Kidney Disease.  Marland Kitchen aPTT (seconds) 08/20/2011 38* 24-37 Final   Comment:                                 IF BASELINE aPTT IS ELEVATED,                          SUGGEST PATIENT RISK ASSESSMENT  BE USED TO DETERMINE APPROPRIATE                          ANTICOAGULANT THERAPY.  . Prothrombin Time (seconds) 08/20/2011 15.9* 11.6-15.2 Final  . INR  08/20/2011 1.24  0.00-1.49 Final  . Color, Urine  08/20/2011 GREEN* YELLOW Final   BIOCHEMICALS MAY BE AFFECTED BY COLOR  . APPearance  08/20/2011 CLOUDY* CLEAR Final  . Specific Gravity, Urine  08/20/2011 1.014  1.005-1.030 Final  . pH  08/20/2011 6.5  5.0-8.0 Final  . Glucose, UA (mg/dL) 32/44/0102 NEGATIVE  NEGATIVE Final  . Hgb urine dipstick  08/20/2011 NEGATIVE  NEGATIVE Final  . Bilirubin Urine  08/20/2011 NEGATIVE  NEGATIVE Final  . Ketones, ur (mg/dL) 72/53/6644 NEGATIVE  NEGATIVE Final  . Protein, ur (mg/dL) 03/47/4259 NEGATIVE  NEGATIVE Final  . Urobilinogen, UA (mg/dL) 56/38/7564 0.2  3.3-2.9 Final  . Nitrite  08/20/2011 NEGATIVE  NEGATIVE Final  . Leukocytes, UA  08/20/2011 NEGATIVE  NEGATIVE Final   MICROSCOPIC NOT DONE ON URINES WITH NEGATIVE PROTEIN, BLOOD, LEUKOCYTES, NITRITE, OR GLUCOSE <1000 mg/dL.    Basename 09/06/11 0410 09/05/11 0431 09/04/11 0445  HGB 9.8* 10.9* 10.9*    Basename 09/06/11 0410 09/05/11 0431  WBC 9.7 9.5  RBC 3.33* 3.72*  HCT 30.1* 33.7*  PLT 143* 149*    Basename  09/05/11 0431 09/04/11 0445  NA 134* 135  K 3.6 3.9  CL 101 101  CO2 23 28  BUN 6 7  CREATININE 0.55 0.55  GLUCOSE 134* 136*  CALCIUM 8.6 8.3*   No results found for this basename: LABPT:2,INR:2 in the last 72 hours  X-Rays:No results found.  EKG: Orders placed in visit on 06/08/11  . EKG 12-LEAD     Hospital Course: Patient was admitted to [facility] and taken to the OR and underwent the above state procedure without complications.  Patient tolerated the procedure well and was later transferred to the recovery room and then to the orthopaedic floor for postoperative care.  They were given PO and IV analgesics for pain control following their surgery.  They were given 24 hours of postoperative antibiotics and started on DVT prophylaxis in the form of Xarelto.   PT and OT were ordered for total joint protocol.  Discharge planning consulted to help with postop disposition and equipment needs.  Patient had a good night on the evening of surgery and started to get up OOB with therapy on day one.  PCA was discontinued and they were weaned over to PO meds.  Hemovac drain was pulled without difficulty.  Continued to work with therapy into day two walking over 25 feet.  Dressing was changed on day two and the incision was healing well.  By day three, the patient had progressed with therapy and meeting their goals. Incision was healing well. The patient wanted to look into SNF so we got the social worker involved.  Patient was seen on day three and felt stable.  If bed becomes available, the patient will be transferred over to SNF of choice today.  Patient was seen in rounds and was ready to go to SNF.  Discharge Medications: Prior to Admission medications   Medication Sig Start Date End Date Taking? Authorizing Provider  metoprolol (LOPRESSOR) 50 MG tablet Take 1/2 tablet by mouth two times daily 08/24/11  Yes Duke Salvia, MD  simvastatin (ZOCOR) 20 MG tablet Take 1 tablet (20 mg total)  by mouth at  bedtime. 07/29/11  Yes Duke Salvia, MD  acetaminophen (TYLENOL) 325 MG tablet Take 2 tablets (650 mg total) by mouth every 6 (six) hours as needed (or Fever >/= 101). 09/06/11 09/05/12  Kamran Coker Julien Girt, PA  bisacodyl (DULCOLAX) 10 MG suppository Place 1 suppository (10 mg total) rectally daily as needed. 09/06/11 09/16/11  Shahiem Bedwell, PA  bisacodyl (DULCOLAX) 5 MG EC tablet Take 2 tablets (10 mg total) by mouth daily as needed. 09/06/11 10/06/11  Dazhane Villagomez, PA  docusate sodium 100 MG CAPS Take 100 mg by mouth 2 (two) times daily. 09/06/11 09/16/11  Pristine Gladhill, PA  HYDROmorphone (DILAUDID) 2 MG tablet Take 1-2 tablets (2-4 mg total) by mouth every 4 (four) hours as needed. 09/06/11 09/16/11  Hadassah Rana, PA  methocarbamol (ROBAXIN) 500 MG tablet Take 1 tablet (500 mg total) by mouth every 6 (six) hours as needed. 09/06/11 09/16/11  Trusten Hume, PA  mometasone (NASONEX) 50 MCG/ACT nasal spray Place 2 sprays into the nose as needed. 08/05/10 08/05/11  Michele Mcalpine, MD  ondansetron (ZOFRAN) 4 MG tablet Take 1 tablet (4 mg total) by mouth every 6 (six) hours as needed for nausea. 09/06/11 09/13/11  Alleyah Twombly, PA  polyethylene glycol (MIRALAX / GLYCOLAX) packet Take 17 g by mouth daily as needed. 09/06/11 09/09/11  Bing Duffey Julien Girt, PA  rivaroxaban (XARELTO) 10 MG TABS tablet Take 1 tablet (10 mg total) by mouth daily with breakfast. Take for two and a half more weeks, then discontinue Xarelto. Patient may resume her Pradaxa once the Xarelto is completed. 09/06/11   Maigen Mozingo Julien Girt, PA  Tetrahydrozoline-Zn Sulfate (EYE DROPS RELIEF OP) Apply 1 drop to eye 3 (three) times daily as needed. For dry eyes    Historical Provider, MD    Diet: heart healthy Activity:WBAT Follow-up:in 2 weeks Disposition - Skilled nursing facility Discharged Condition: good   Discharge Orders    Future Orders Please Complete By Expires   Diet general      Call MD / Call  911      Comments:   If you experience chest pain or shortness of breath, CALL 911 and be transported to the hospital emergency room.  If you develope a fever above 101 F, pus (white drainage) or increased drainage or redness at the wound, or calf pain, call your surgeon's office.   Discharge instructions      Comments:   Pick up stool softner and laxative for home. Do not submerge incision under water. May shower. Continue to use ice for pain and swelling from surgery.   Take Xarelto for two and a half more weeks, then discontinue Xarelto. Patient may resume her Pradaxa once the Xarelto is completed.  When discharged from the skilled rehab facility, please have the facility set up the patient's Home Health Physical Therapy prior to being released.  Also provide the patient with their medications at time of release from the facility to include their pain medication, the muscle relaxants, and their blood thinner medication.  If the patient is still at the rehab facility at time of follow up appointment, please also assist the patient in arranging follow up appointment in our office and any transportation needs.    Constipation Prevention      Comments:   Drink plenty of fluids.  Prune juice may be helpful.  You may use a stool softener, such as Colace (over the counter) 100 mg twice a day.  Use MiraLax (over the counter) for constipation as needed.  Increase activity slowly as tolerated      Weight Bearing as taught in Physical Therapy      Comments:   Use a walker or crutches as instructed.   Patient may shower      Comments:   You may shower without a dressing once there is no drainage.  Do not wash over the wound.  If drainage remains, do not shower until drainage stops.   Driving restrictions      Comments:   No driving until released by the physician.   Lifting restrictions      Comments:   No lifting until released by the physician.   TED hose      Comments:   Use stockings  (TED hose) for 3 weeks on both leg(s).  You may remove them at night for sleeping.   Change dressing      Comments:   Change dressing daily with sterile 4 x 4 inch gauze dressing and apply TED hose. Do not submerge the incision under water.   Do not put a pillow under the knee. Place it under the heel.      Do not sit on low chairs, stoools or toilet seats, as it may be difficult to get up from low surfaces        Medication List  As of 09/06/2011 10:09 AM   STOP taking these medications         dabigatran 150 MG Caps      estradiol 0.05 MG/24HR      Fish Oil 1000 MG Caps      Vitamin D3 1000 UNITS Caps         TAKE these medications         acetaminophen 325 MG tablet   Commonly known as: TYLENOL   Take 2 tablets (650 mg total) by mouth every 6 (six) hours as needed (or Fever >/= 101).      bisacodyl 5 MG EC tablet   Commonly known as: DULCOLAX   Take 2 tablets (10 mg total) by mouth daily as needed.      bisacodyl 10 MG suppository   Commonly known as: DULCOLAX   Place 1 suppository (10 mg total) rectally daily as needed.      DSS 100 MG Caps   Take 100 mg by mouth 2 (two) times daily.      EYE DROPS RELIEF OP   Apply 1 drop to eye 3 (three) times daily as needed. For dry eyes      HYDROmorphone 2 MG tablet   Commonly known as: DILAUDID   Take 1-2 tablets (2-4 mg total) by mouth every 4 (four) hours as needed.      methocarbamol 500 MG tablet   Commonly known as: ROBAXIN   Take 1 tablet (500 mg total) by mouth every 6 (six) hours as needed.      metoprolol 50 MG tablet   Commonly known as: LOPRESSOR   Take 1/2 tablet by mouth two times daily      mometasone 50 MCG/ACT nasal spray   Commonly known as: NASONEX   Place 2 sprays into the nose as needed.      ondansetron 4 MG tablet   Commonly known as: ZOFRAN   Take 1 tablet (4 mg total) by mouth every 6 (six) hours as needed for nausea.      polyethylene glycol packet   Commonly known as: MIRALAX / GLYCOLAX    Take 17 g by mouth daily  as needed.      rivaroxaban 10 MG Tabs tablet   Commonly known as: XARELTO   Take 1 tablet (10 mg total) by mouth daily with breakfast. Take for two and a half more weeks, then discontinue Xarelto. Patient may resume her Pradaxa once the Xarelto is completed.      simvastatin 20 MG tablet   Commonly known as: ZOCOR   Take 1 tablet (20 mg total) by mouth at bedtime.           Follow-up Information    Follow up with Loanne Drilling, MD. Schedule an appointment as soon as possible for a visit in 2 weeks.   Contact information:   [facility] [address] [place] [address] [phone]          Signed: Patrica Duel 09/06/2011, 10:09 AM

## 2011-09-06 NOTE — Progress Notes (Signed)
Physical Therapy Treatment Patient Details Name: Samantha Bishop MRN: [id number] DOB: [birth year] Today's Date: 09/06/2011 Time: 4098-1191 PT Time Calculation (min): 32 min  PT Assessment / Plan / Recommendation Comments on Treatment Session  Pt limited by c/o pain.  Daughter reports pt expects "no pain" with ther ex and mobility.  Ice pack applied to L knee.  Pt for d/c to SNF today.    Follow Up Recommendations  Skilled nursing facility    Equipment Recommendations  Defer to next venue    Frequency 7X/week   Plan Discharge plan remains appropriate;Frequency remains appropriate    Precautions / Restrictions Precautions Precautions: Knee Precaution Comments: no pillow under knee Required Braces or Orthoses: Knee Immobilizer - Left Knee Immobilizer - Left: Discontinue once straight leg raise with < 10 degree lag Restrictions Weight Bearing Restrictions: No LLE Weight Bearing: Weight bearing as tolerated   Pertinent Vitals/Pain C/o L LE pain, did not rate-RN aware & Ice provided.    Mobility  Transfers Transfers: Sit to Stand;Stand to Sit Sit to Stand: 4: Min assist;With upper extremity assist;With armrests;From chair/3-in-1 Stand to Sit: 4: Min assist;With upper extremity assist;With armrests;To chair/3-in-1 Details for Transfer Assistance: Verbal cues for hand placement, transfering weight over BOS and for L LE placement with stand to sit. Ambulation/Gait Ambulation/Gait Assistance: 4: Min assist Ambulation Distance (Feet): 40 Feet Assistive device: Rolling walker Ambulation/Gait Assistance Details: Cues for sequencing, upright posture. Gait Pattern: Step-to pattern;Decreased stance time - left;Antalgic;Trunk flexed Stairs: No Wheelchair Mobility Wheelchair Mobility: No    Exercises Total Joint Exercises Ankle Circles/Pumps: AROM;Both;20 reps;Supine Quad Sets: AROM;Left;10 reps;Supine Short Arc Quad: AAROM;Left;5 reps;Supine Knee Flexion: AAROM;Left;10 reps;Seated    PT Goals Acute Rehab PT Goals Time For Goal Achievement: 09/09/11 Potential to Achieve Goals: Good PT Goal: Sit to Stand - Progress: Progressing toward goal PT Goal: Stand to Sit - Progress: Progressing toward goal PT Goal: Ambulate - Progress: Progressing toward goal PT Goal: Perform Home Exercise Program - Progress: Progressing toward goal  Visit Information  Last PT Received On: 09/06/11 Assistance Needed: +1    Subjective Data  Subjective: "Maybe I was too old to have this done."   Cognition  Overall Cognitive Status: Appears within functional limits for tasks assessed/performed Arousal/Alertness: Awake/alert Orientation Level: Appears intact for tasks assessed Behavior During Session: [facility] for tasks performed    Balance     End of Session PT - End of Session Equipment Utilized During Treatment: Gait belt;Left knee immobilizer Activity Tolerance: Patient limited by pain Patient left: in chair;with call bell/phone within reach;with family/visitor present Nurse Communication: Patient requests pain meds    Newell Coral 09/06/2011, 1:22 PM  Newell Coral, PTA Acute Rehab [phone] (office)

## 2011-09-06 NOTE — Progress Notes (Signed)
Subjective: 3 Days Post-Op Procedure(s) (LRB): TOTAL KNEE ARTHROPLASTY (Left) Patient reports pain as mild.   Patient seen in rounds with Dr. Lequita Halt. Patient is well, and has had no acute complaints or problems  Objective: Vital signs in last 24 hours: Temp:  [98.5 F (36.9 C)-100.7 F (38.2 C)] 98.5 F (36.9 C) (04/29 0531) Pulse Rate:  [83-105] 83  (04/29 0531) Resp:  [16] 16  (04/29 0531) BP: (138-158)/(77-86) 138/77 mmHg (04/29 0531) SpO2:  [94 %-96 %] 96 % (04/29 0531)  Intake/Output from previous day:  Intake/Output Summary (Last 24 hours) at 09/06/11 0804 Last data filed at 09/06/11 0644  Gross per 24 hour  Intake 1208.67 ml  Output    700 ml  Net 508.67 ml    Intake/Output this shift:    Labs:  Basename 09/06/11 0410 09/05/11 0431 09/04/11 0445  HGB 9.8* 10.9* 10.9*    Basename 09/06/11 0410 09/05/11 0431  WBC 9.7 9.5  RBC 3.33* 3.72*  HCT 30.1* 33.7*  PLT 143* 149*    Basename 09/05/11 0431 09/04/11 0445  NA 134* 135  K 3.6 3.9  CL 101 101  CO2 23 28  BUN 6 7  CREATININE 0.55 0.55  GLUCOSE 134* 136*  CALCIUM 8.6 8.3*   No results found for this basename: LABPT:2,INR:2 in the last 72 hours  EXAM: General - Patient is Alert, Appropriate and Oriented Extremity - Neurovascular intact Sensation intact distally Incision - clean, dry, no drainage, healing Motor Function - intact, moving foot and toes well on exam.   Assessment/Plan: 3 Days Post-Op Procedure(s) (LRB): TOTAL KNEE ARTHROPLASTY (Left) Procedure(s) (LRB): TOTAL KNEE ARTHROPLASTY (Left) Past Medical History  Diagnosis Date  . HTN (hypertension)   . Atrial fibrillation     managed with rate control and anticoagulation.   . Tachycardia, unspecified   . Pulmonary emboli     following remote knee arthroscopy surgery  . Hyperlipidemia   . Diverticulosis of colon (without mention of hemorrhage)   . Benign neoplasm of colon   . Unspecified hemorrhoids without mention of complication    . Calculus of kidney   . Osteoarthrosis, unspecified whether generalized or localized, unspecified site   . Lumbago   . Cerebral aneurysm, nonruptured   . Anxiety state, unspecified   . Persistent disorder of initiating or maintaining sleep   . Chronic anticoagulation     on Pradaxa  . Atrial tachycardia     in the remote past with remote ablation  . UTI (urinary tract infection)     CURRENTLY BEING TX FOR UTI  . Esophageal reflux     YRS AGO - NO PROBLEM NOW  . History of skin cancer   . Ruptured lumbar disc   . Complication of anesthesia     "my heart stopped" 2004 knee  surg - see note on chart  . Sleep apnea     stop bang score 4    Principal Problem:  *OA (osteoarthritis) of knee   Advance diet Discharge to SNF if bed available today or tomorrow.  Will get social worker involved. Diet - Cardiac diet Follow up - in 2 weeks Activity - WBAT Disposition - Skilled nursing facility Condition Upon Discharge - Good if bed available D/C Meds - See DC Summary DVT Prophylaxis - Xarelto  Samantha Bishop 09/06/2011, 8:04 AM

## 2011-09-07 ENCOUNTER — Encounter (HOSPITAL_COMMUNITY): Payer: Self-pay | Admitting: Orthopedic Surgery

## 2011-09-08 DIAGNOSIS — I10 Essential (primary) hypertension: Secondary | ICD-10-CM | POA: Diagnosis not present

## 2011-09-08 DIAGNOSIS — M1710 Unilateral primary osteoarthritis, unspecified knee: Secondary | ICD-10-CM | POA: Diagnosis not present

## 2011-09-08 DIAGNOSIS — I4891 Unspecified atrial fibrillation: Secondary | ICD-10-CM | POA: Diagnosis not present

## 2011-09-08 DIAGNOSIS — K5900 Constipation, unspecified: Secondary | ICD-10-CM | POA: Diagnosis not present

## 2011-09-09 DIAGNOSIS — M1710 Unilateral primary osteoarthritis, unspecified knee: Secondary | ICD-10-CM | POA: Diagnosis not present

## 2011-09-09 DIAGNOSIS — T8140XA Infection following a procedure, unspecified, initial encounter: Secondary | ICD-10-CM | POA: Diagnosis not present

## 2011-09-30 DIAGNOSIS — Z5189 Encounter for other specified aftercare: Secondary | ICD-10-CM | POA: Diagnosis not present

## 2011-09-30 DIAGNOSIS — IMO0002 Reserved for concepts with insufficient information to code with codable children: Secondary | ICD-10-CM | POA: Diagnosis not present

## 2011-09-30 DIAGNOSIS — Z96659 Presence of unspecified artificial knee joint: Secondary | ICD-10-CM | POA: Diagnosis not present

## 2011-09-30 DIAGNOSIS — Z471 Aftercare following joint replacement surgery: Secondary | ICD-10-CM | POA: Diagnosis not present

## 2011-09-30 DIAGNOSIS — M1710 Unilateral primary osteoarthritis, unspecified knee: Secondary | ICD-10-CM | POA: Diagnosis not present

## 2011-09-30 DIAGNOSIS — M545 Low back pain, unspecified: Secondary | ICD-10-CM | POA: Diagnosis not present

## 2011-10-04 DIAGNOSIS — Z5189 Encounter for other specified aftercare: Secondary | ICD-10-CM | POA: Diagnosis not present

## 2011-10-04 DIAGNOSIS — M545 Low back pain, unspecified: Secondary | ICD-10-CM | POA: Diagnosis not present

## 2011-10-04 DIAGNOSIS — IMO0002 Reserved for concepts with insufficient information to code with codable children: Secondary | ICD-10-CM | POA: Diagnosis not present

## 2011-10-04 DIAGNOSIS — Z471 Aftercare following joint replacement surgery: Secondary | ICD-10-CM | POA: Diagnosis not present

## 2011-10-04 DIAGNOSIS — M1710 Unilateral primary osteoarthritis, unspecified knee: Secondary | ICD-10-CM | POA: Diagnosis not present

## 2011-10-04 DIAGNOSIS — Z96659 Presence of unspecified artificial knee joint: Secondary | ICD-10-CM | POA: Diagnosis not present

## 2011-10-05 DIAGNOSIS — M545 Low back pain, unspecified: Secondary | ICD-10-CM | POA: Diagnosis not present

## 2011-10-05 DIAGNOSIS — Z96659 Presence of unspecified artificial knee joint: Secondary | ICD-10-CM | POA: Diagnosis not present

## 2011-10-05 DIAGNOSIS — IMO0002 Reserved for concepts with insufficient information to code with codable children: Secondary | ICD-10-CM | POA: Diagnosis not present

## 2011-10-05 DIAGNOSIS — Z5189 Encounter for other specified aftercare: Secondary | ICD-10-CM | POA: Diagnosis not present

## 2011-10-05 DIAGNOSIS — M1710 Unilateral primary osteoarthritis, unspecified knee: Secondary | ICD-10-CM | POA: Diagnosis not present

## 2011-10-05 DIAGNOSIS — Z471 Aftercare following joint replacement surgery: Secondary | ICD-10-CM | POA: Diagnosis not present

## 2011-10-06 DIAGNOSIS — IMO0002 Reserved for concepts with insufficient information to code with codable children: Secondary | ICD-10-CM | POA: Diagnosis not present

## 2011-10-06 DIAGNOSIS — Z471 Aftercare following joint replacement surgery: Secondary | ICD-10-CM | POA: Diagnosis not present

## 2011-10-06 DIAGNOSIS — M545 Low back pain, unspecified: Secondary | ICD-10-CM | POA: Diagnosis not present

## 2011-10-06 DIAGNOSIS — Z5189 Encounter for other specified aftercare: Secondary | ICD-10-CM | POA: Diagnosis not present

## 2011-10-06 DIAGNOSIS — Z96659 Presence of unspecified artificial knee joint: Secondary | ICD-10-CM | POA: Diagnosis not present

## 2011-10-06 DIAGNOSIS — M1710 Unilateral primary osteoarthritis, unspecified knee: Secondary | ICD-10-CM | POA: Diagnosis not present

## 2011-10-07 DIAGNOSIS — M545 Low back pain, unspecified: Secondary | ICD-10-CM | POA: Diagnosis not present

## 2011-10-07 DIAGNOSIS — IMO0002 Reserved for concepts with insufficient information to code with codable children: Secondary | ICD-10-CM | POA: Diagnosis not present

## 2011-10-07 DIAGNOSIS — Z471 Aftercare following joint replacement surgery: Secondary | ICD-10-CM | POA: Diagnosis not present

## 2011-10-07 DIAGNOSIS — M1710 Unilateral primary osteoarthritis, unspecified knee: Secondary | ICD-10-CM | POA: Diagnosis not present

## 2011-10-07 DIAGNOSIS — Z96659 Presence of unspecified artificial knee joint: Secondary | ICD-10-CM | POA: Diagnosis not present

## 2011-10-07 DIAGNOSIS — Z5189 Encounter for other specified aftercare: Secondary | ICD-10-CM | POA: Diagnosis not present

## 2011-10-08 DIAGNOSIS — M1710 Unilateral primary osteoarthritis, unspecified knee: Secondary | ICD-10-CM | POA: Diagnosis not present

## 2011-10-11 DIAGNOSIS — Z471 Aftercare following joint replacement surgery: Secondary | ICD-10-CM | POA: Diagnosis not present

## 2011-10-11 DIAGNOSIS — Z96659 Presence of unspecified artificial knee joint: Secondary | ICD-10-CM | POA: Diagnosis not present

## 2011-10-11 DIAGNOSIS — M545 Low back pain, unspecified: Secondary | ICD-10-CM | POA: Diagnosis not present

## 2011-10-11 DIAGNOSIS — IMO0002 Reserved for concepts with insufficient information to code with codable children: Secondary | ICD-10-CM | POA: Diagnosis not present

## 2011-10-11 DIAGNOSIS — Z5189 Encounter for other specified aftercare: Secondary | ICD-10-CM | POA: Diagnosis not present

## 2011-10-11 DIAGNOSIS — M1710 Unilateral primary osteoarthritis, unspecified knee: Secondary | ICD-10-CM | POA: Diagnosis not present

## 2011-10-12 DIAGNOSIS — Z5189 Encounter for other specified aftercare: Secondary | ICD-10-CM | POA: Diagnosis not present

## 2011-10-12 DIAGNOSIS — Z471 Aftercare following joint replacement surgery: Secondary | ICD-10-CM | POA: Diagnosis not present

## 2011-10-12 DIAGNOSIS — M545 Low back pain, unspecified: Secondary | ICD-10-CM | POA: Diagnosis not present

## 2011-10-12 DIAGNOSIS — Z96659 Presence of unspecified artificial knee joint: Secondary | ICD-10-CM | POA: Diagnosis not present

## 2011-10-12 DIAGNOSIS — M1710 Unilateral primary osteoarthritis, unspecified knee: Secondary | ICD-10-CM | POA: Diagnosis not present

## 2011-10-12 DIAGNOSIS — IMO0002 Reserved for concepts with insufficient information to code with codable children: Secondary | ICD-10-CM | POA: Diagnosis not present

## 2011-10-15 DIAGNOSIS — Z471 Aftercare following joint replacement surgery: Secondary | ICD-10-CM | POA: Diagnosis not present

## 2011-10-15 DIAGNOSIS — M545 Low back pain, unspecified: Secondary | ICD-10-CM | POA: Diagnosis not present

## 2011-10-15 DIAGNOSIS — IMO0002 Reserved for concepts with insufficient information to code with codable children: Secondary | ICD-10-CM | POA: Diagnosis not present

## 2011-10-15 DIAGNOSIS — M1710 Unilateral primary osteoarthritis, unspecified knee: Secondary | ICD-10-CM | POA: Diagnosis not present

## 2011-10-15 DIAGNOSIS — Z5189 Encounter for other specified aftercare: Secondary | ICD-10-CM | POA: Diagnosis not present

## 2011-10-15 DIAGNOSIS — Z96659 Presence of unspecified artificial knee joint: Secondary | ICD-10-CM | POA: Diagnosis not present

## 2011-10-18 DIAGNOSIS — M545 Low back pain, unspecified: Secondary | ICD-10-CM | POA: Diagnosis not present

## 2011-10-18 DIAGNOSIS — IMO0002 Reserved for concepts with insufficient information to code with codable children: Secondary | ICD-10-CM | POA: Diagnosis not present

## 2011-10-18 DIAGNOSIS — Z5189 Encounter for other specified aftercare: Secondary | ICD-10-CM | POA: Diagnosis not present

## 2011-10-18 DIAGNOSIS — M1710 Unilateral primary osteoarthritis, unspecified knee: Secondary | ICD-10-CM | POA: Diagnosis not present

## 2011-10-18 DIAGNOSIS — Z96659 Presence of unspecified artificial knee joint: Secondary | ICD-10-CM | POA: Diagnosis not present

## 2011-10-18 DIAGNOSIS — Z471 Aftercare following joint replacement surgery: Secondary | ICD-10-CM | POA: Diagnosis not present

## 2011-10-20 DIAGNOSIS — IMO0002 Reserved for concepts with insufficient information to code with codable children: Secondary | ICD-10-CM | POA: Diagnosis not present

## 2011-10-20 DIAGNOSIS — Z96659 Presence of unspecified artificial knee joint: Secondary | ICD-10-CM | POA: Diagnosis not present

## 2011-10-20 DIAGNOSIS — Z5189 Encounter for other specified aftercare: Secondary | ICD-10-CM | POA: Diagnosis not present

## 2011-10-20 DIAGNOSIS — M545 Low back pain, unspecified: Secondary | ICD-10-CM | POA: Diagnosis not present

## 2011-10-20 DIAGNOSIS — M1710 Unilateral primary osteoarthritis, unspecified knee: Secondary | ICD-10-CM | POA: Diagnosis not present

## 2011-10-20 DIAGNOSIS — Z471 Aftercare following joint replacement surgery: Secondary | ICD-10-CM | POA: Diagnosis not present

## 2011-10-22 DIAGNOSIS — M545 Low back pain, unspecified: Secondary | ICD-10-CM | POA: Diagnosis not present

## 2011-10-22 DIAGNOSIS — Z471 Aftercare following joint replacement surgery: Secondary | ICD-10-CM | POA: Diagnosis not present

## 2011-10-22 DIAGNOSIS — IMO0002 Reserved for concepts with insufficient information to code with codable children: Secondary | ICD-10-CM | POA: Diagnosis not present

## 2011-10-22 DIAGNOSIS — M1710 Unilateral primary osteoarthritis, unspecified knee: Secondary | ICD-10-CM | POA: Diagnosis not present

## 2011-10-22 DIAGNOSIS — Z96659 Presence of unspecified artificial knee joint: Secondary | ICD-10-CM | POA: Diagnosis not present

## 2011-10-22 DIAGNOSIS — Z5189 Encounter for other specified aftercare: Secondary | ICD-10-CM | POA: Diagnosis not present

## 2011-10-25 DIAGNOSIS — IMO0002 Reserved for concepts with insufficient information to code with codable children: Secondary | ICD-10-CM | POA: Diagnosis not present

## 2011-10-25 DIAGNOSIS — M1710 Unilateral primary osteoarthritis, unspecified knee: Secondary | ICD-10-CM | POA: Diagnosis not present

## 2011-10-27 DIAGNOSIS — M1710 Unilateral primary osteoarthritis, unspecified knee: Secondary | ICD-10-CM | POA: Diagnosis not present

## 2011-10-27 DIAGNOSIS — IMO0002 Reserved for concepts with insufficient information to code with codable children: Secondary | ICD-10-CM | POA: Diagnosis not present

## 2011-10-29 DIAGNOSIS — M1710 Unilateral primary osteoarthritis, unspecified knee: Secondary | ICD-10-CM | POA: Diagnosis not present

## 2011-10-29 DIAGNOSIS — IMO0002 Reserved for concepts with insufficient information to code with codable children: Secondary | ICD-10-CM | POA: Diagnosis not present

## 2011-11-01 DIAGNOSIS — M1710 Unilateral primary osteoarthritis, unspecified knee: Secondary | ICD-10-CM | POA: Diagnosis not present

## 2011-11-01 DIAGNOSIS — IMO0002 Reserved for concepts with insufficient information to code with codable children: Secondary | ICD-10-CM | POA: Diagnosis not present

## 2011-11-03 DIAGNOSIS — M1710 Unilateral primary osteoarthritis, unspecified knee: Secondary | ICD-10-CM | POA: Diagnosis not present

## 2011-11-03 DIAGNOSIS — IMO0002 Reserved for concepts with insufficient information to code with codable children: Secondary | ICD-10-CM | POA: Diagnosis not present

## 2011-11-05 DIAGNOSIS — IMO0002 Reserved for concepts with insufficient information to code with codable children: Secondary | ICD-10-CM | POA: Diagnosis not present

## 2011-11-05 DIAGNOSIS — M1710 Unilateral primary osteoarthritis, unspecified knee: Secondary | ICD-10-CM | POA: Diagnosis not present

## 2011-11-10 DIAGNOSIS — M1710 Unilateral primary osteoarthritis, unspecified knee: Secondary | ICD-10-CM | POA: Diagnosis not present

## 2011-11-10 DIAGNOSIS — IMO0002 Reserved for concepts with insufficient information to code with codable children: Secondary | ICD-10-CM | POA: Diagnosis not present

## 2011-11-16 DIAGNOSIS — M1710 Unilateral primary osteoarthritis, unspecified knee: Secondary | ICD-10-CM | POA: Diagnosis not present

## 2011-11-16 DIAGNOSIS — IMO0002 Reserved for concepts with insufficient information to code with codable children: Secondary | ICD-10-CM | POA: Diagnosis not present

## 2011-11-19 DIAGNOSIS — M1710 Unilateral primary osteoarthritis, unspecified knee: Secondary | ICD-10-CM | POA: Diagnosis not present

## 2011-11-19 DIAGNOSIS — IMO0002 Reserved for concepts with insufficient information to code with codable children: Secondary | ICD-10-CM | POA: Diagnosis not present

## 2011-11-22 DIAGNOSIS — IMO0002 Reserved for concepts with insufficient information to code with codable children: Secondary | ICD-10-CM | POA: Diagnosis not present

## 2011-11-22 DIAGNOSIS — M1710 Unilateral primary osteoarthritis, unspecified knee: Secondary | ICD-10-CM | POA: Diagnosis not present

## 2011-11-29 DIAGNOSIS — IMO0002 Reserved for concepts with insufficient information to code with codable children: Secondary | ICD-10-CM | POA: Diagnosis not present

## 2011-11-29 DIAGNOSIS — M1710 Unilateral primary osteoarthritis, unspecified knee: Secondary | ICD-10-CM | POA: Diagnosis not present

## 2011-12-01 DIAGNOSIS — M1710 Unilateral primary osteoarthritis, unspecified knee: Secondary | ICD-10-CM | POA: Diagnosis not present

## 2011-12-01 DIAGNOSIS — IMO0002 Reserved for concepts with insufficient information to code with codable children: Secondary | ICD-10-CM | POA: Diagnosis not present

## 2011-12-28 ENCOUNTER — Other Ambulatory Visit: Payer: Self-pay | Admitting: Internal Medicine

## 2011-12-28 DIAGNOSIS — I4891 Unspecified atrial fibrillation: Secondary | ICD-10-CM

## 2011-12-28 MED ORDER — SIMVASTATIN 20 MG PO TABS: 20.0000 mg | ORAL_TABLET | Freq: Every day | ORAL | Status: DC

## 2011-12-28 MED ORDER — METOPROLOL TARTRATE 50 MG PO TABS: ORAL_TABLET | ORAL | Status: DC

## 2011-12-28 NOTE — Telephone Encounter (Signed)
Pt wants 90 day supply

## 2012-02-08 DIAGNOSIS — J3089 Other allergic rhinitis: Secondary | ICD-10-CM | POA: Insufficient documentation

## 2012-02-08 DIAGNOSIS — R0981 Nasal congestion: Secondary | ICD-10-CM | POA: Insufficient documentation

## 2012-02-08 DIAGNOSIS — E639 Nutritional deficiency, unspecified: Secondary | ICD-10-CM | POA: Insufficient documentation

## 2012-03-07 DIAGNOSIS — M67919 Unspecified disorder of synovium and tendon, unspecified shoulder: Secondary | ICD-10-CM | POA: Diagnosis not present

## 2012-03-07 DIAGNOSIS — M719 Bursopathy, unspecified: Secondary | ICD-10-CM | POA: Diagnosis not present

## 2012-04-04 DIAGNOSIS — M67919 Unspecified disorder of synovium and tendon, unspecified shoulder: Secondary | ICD-10-CM | POA: Diagnosis not present

## 2012-04-04 DIAGNOSIS — M719 Bursopathy, unspecified: Secondary | ICD-10-CM | POA: Diagnosis not present

## 2012-04-11 DIAGNOSIS — M19019 Primary osteoarthritis, unspecified shoulder: Secondary | ICD-10-CM | POA: Diagnosis not present

## 2012-04-18 DIAGNOSIS — S43429A Sprain of unspecified rotator cuff capsule, initial encounter: Secondary | ICD-10-CM | POA: Diagnosis not present

## 2012-05-17 DIAGNOSIS — E2839 Other primary ovarian failure: Secondary | ICD-10-CM | POA: Diagnosis not present

## 2012-05-30 DIAGNOSIS — S43429A Sprain of unspecified rotator cuff capsule, initial encounter: Secondary | ICD-10-CM | POA: Diagnosis not present

## 2012-06-08 ENCOUNTER — Encounter: Payer: Self-pay | Admitting: Internal Medicine

## 2012-06-08 ENCOUNTER — Ambulatory Visit (INDEPENDENT_AMBULATORY_CARE_PROVIDER_SITE_OTHER): Payer: Medicare Other | Admitting: Internal Medicine

## 2012-06-08 VITALS — BP 172/89 | HR 88 | Ht 66.5 in | Wt 183.8 lb

## 2012-06-08 DIAGNOSIS — I10 Essential (primary) hypertension: Secondary | ICD-10-CM | POA: Diagnosis not present

## 2012-06-08 DIAGNOSIS — I4891 Unspecified atrial fibrillation: Secondary | ICD-10-CM | POA: Diagnosis not present

## 2012-06-08 DIAGNOSIS — E782 Mixed hyperlipidemia: Secondary | ICD-10-CM | POA: Diagnosis not present

## 2012-06-08 DIAGNOSIS — L659 Nonscarring hair loss, unspecified: Secondary | ICD-10-CM | POA: Insufficient documentation

## 2012-06-08 MED ORDER — HYDROCHLOROTHIAZIDE 25 MG PO TABS: 25.0000 mg | ORAL_TABLET | Freq: Every day | ORAL | Status: DC

## 2012-06-08 MED ORDER — HYDROCHLOROTHIAZIDE 25 MG PO TABS: ORAL_TABLET | ORAL | Status: DC

## 2012-06-08 MED ORDER — DILTIAZEM HCL ER COATED BEADS 120 MG PO CP24: 120.0000 mg | ORAL_CAPSULE | Freq: Every day | ORAL | Status: DC

## 2012-06-08 NOTE — Assessment & Plan Note (Addendum)
Blood pressure is elevated. As we stop the beta blocker we'll add diltiazem as noted. She also has edema so we'll add hydrochlorothiazide. We'll check a metabolic profile in 2 weeks time to assess potassium  Renal function 4/13 was normal as was her potassium

## 2012-06-08 NOTE — Assessment & Plan Note (Signed)
The patient has alopecia and has had for many years. It is a known side effect of beta blockers. We will stop her Lopressor. She will need alternative rate control for her atrial fibrillation we'll start her on diltiazem 120 mg. I have reviewed side effects.

## 2012-06-08 NOTE — Patient Instructions (Signed)
Your physician has recommended you make the following change in your medication:  1) Stop metoprolol 2) Start diltiazem 120 mg once daily. 3) Start hydrochlorothiazide 25 mg 1/2 tablet by mouth once daily.  Your physician recommends that you return for FASTING lab work in: 2 weeks- bmp/lipid  Your physician recommends that you have a blood pressure check with the nurse in 2 weeks (same day as lab work)  Your physician recommends that you schedule a follow-up appointment in: 6 weeks with Dr. Kriste Basque.  Your physician wants you to follow-up in: 1 year with Dr. Graciela Husbands. You will receive a reminder letter in the mail two months in advance. If you don't receive a letter, please call our office to schedule the follow-up appointment.

## 2012-06-08 NOTE — Progress Notes (Signed)
Patient Care Team: Michele Mcalpine, MD as PCP - General (Pulmonary Disease)   HPI  Samantha Bishop is a 79 y.o. female  seen in followup for atrial fibrillation associated with symptomatic palpitations. She is a CHADS VASC score of 4 and is on Pradaxa. She is tolerating without symptoms. echo cardiogram 2010 demonstrated normal left ventricular function. She was last seen in our office about one year ago prior to knee surgery.    She has had persistent problems with knee discomfort following her knee surgery.  Her other complaints are alopecia which date back many years and for which she has been put on various statins. She has been on Lopressor for only here. Her blood pressure is elevated. She has had problems with chronic peripheral edema left greater than right  Past Medical History  Diagnosis Date  . HTN (hypertension)   . Atrial fibrillation     managed with rate control and anticoagulation.   . Tachycardia, unspecified   . Pulmonary emboli     following remote knee arthroscopy surgery  . Hyperlipidemia   . Diverticulosis of colon (without mention of hemorrhage)   . Benign neoplasm of colon   . Unspecified hemorrhoids without mention of complication   . Calculus of kidney   . Osteoarthrosis, unspecified whether generalized or localized, unspecified site   . Lumbago   . Cerebral aneurysm, nonruptured   . Anxiety state, unspecified   . Persistent disorder of initiating or maintaining sleep   . Chronic anticoagulation     on Pradaxa  . Atrial tachycardia     in the remote past with remote ablation  . UTI (urinary tract infection)     CURRENTLY BEING TX FOR UTI  . Esophageal reflux     YRS AGO - NO PROBLEM NOW  . History of skin cancer   . Ruptured lumbar disc   . Complication of anesthesia     "my heart stopped" 2004 knee  surg - see note on chart  . Sleep apnea     stop bang score 4     Past Surgical History  Procedure Date  . Tonsillectomy and adenoidectomy   .  Appendectomy   . Abdominal hysterectomy   . Knee surgery   . Breast cyst excision   . Blocked intestine surgery   . Knee arthroscopy     X2 L KNEE  . Joint replacement 2004    RT TOTAL KNEE  . Nasal surgery x2   . Hemhorroidectomy   . Total knee arthroplasty 09/03/2011    Procedure: TOTAL KNEE ARTHROPLASTY;  Surgeon: Loanne Drilling, MD;  Location: [facility];  Service: Orthopedics;  Laterality: Left;    Current Outpatient Prescriptions  Medication Sig Dispense Refill  . dabigatran (PRADAXA) 150 MG CAPS Take 150 mg by mouth 2 (two) times daily.      . metoprolol (LOPRESSOR) 50 MG tablet Take 1/2 tablet by mouth two times daily  90 tablet  2  . simvastatin (ZOCOR) 20 MG tablet Take 1 tablet (20 mg total) by mouth at bedtime.  90 tablet  3  . acetaminophen (TYLENOL) 325 MG tablet Take 2 tablets (650 mg total) by mouth every 6 (six) hours as needed (or Fever >/= 101).  60 tablet  0  . mometasone (NASONEX) 50 MCG/ACT nasal spray Place 2 sprays into the nose as needed.      . rivaroxaban (XARELTO) 10 MG TABS tablet Take 1 tablet (10 mg total) by mouth daily with  breakfast. Take for two and a half more weeks, then discontinue Xarelto. Patient may resume her Pradaxa once the Xarelto is completed.  18 tablet  0  . Tetrahydrozoline-Zn Sulfate (EYE DROPS RELIEF OP) Apply 1 drop to eye 3 (three) times daily as needed. For dry eyes        Allergies  Allergen Reactions  . Codeine     REACTION: causes nausea  . Penicillins     REACTION: rash    Review of Systems negative except from HPI and PMH  Physical Exam BP 172/89  Pulse 88  Ht 5' 6.5" (1.689 m)  Wt 183 lb 12.8 oz (83.371 kg)  BMI 29.22 kg/m2 Well developed and well nourished in no acute distress HENT normal E scleral and icterus clear Neck Supple JVP flat; carotids brisk and full Clear to ausculation irregularly irregular  2/6 early systolic murmur   Soft with active bowel sounds No clubbing cyanosis 1+ Edema Alert and  oriented, grossly normal motor and sensory function Skin Warm and Dry  ECG demonstrates atrial fibrillation at 85 Intervals-/08/38 axis is 20 Nonspecific ST-T changes with poor R wave progression low voltage Echo 2010 demonstrated normal left ventricular function without evidence of hypertrophy  Assessment and  Plan

## 2012-06-08 NOTE — Assessment & Plan Note (Signed)
Permanent atrial fibrillation. She is on Pradaxa. We'll check a metabolic profile to clarify dosing. We will add hydrochlorothiazide for her blood pressure and diltiazem for rate control

## 2012-06-16 ENCOUNTER — Telehealth: Payer: Self-pay | Admitting: *Deleted

## 2012-06-16 DIAGNOSIS — E782 Mixed hyperlipidemia: Secondary | ICD-10-CM

## 2012-06-16 NOTE — Telephone Encounter (Signed)
Call placed to the patient regarding a fax we received from the pharmacy regarding a drug interaction between diltiazem and simvastatin. Per Dr. Graciela Husbands, decrease simvastatin from 20 mg daily to 10 mg daily. I have called and notified the patient of this. She states she wasn't sure if she was supposed to continue her simvastatin anyway. She has been off this for about 5-6 days. I advised she can take 10 mg daily of simvastatin. She is due to come in next week for a baseline on her lipid profile as well.

## 2012-06-20 ENCOUNTER — Telehealth: Payer: Self-pay | Admitting: Internal Medicine

## 2012-06-20 NOTE — Telephone Encounter (Signed)
I called and spoke with the patient. I explained to her that her diltiazem was not being cut in half, but that her simvastatin is what she should be cutting in half. The patient verbalizes understanding.

## 2012-06-20 NOTE — Telephone Encounter (Signed)
New problem     Was told to cut this medication in half.   Diltiazem is in a green capsule . This can not be cut in half.

## 2012-06-22 ENCOUNTER — Other Ambulatory Visit: Payer: Medicare Other

## 2012-06-30 ENCOUNTER — Ambulatory Visit (INDEPENDENT_AMBULATORY_CARE_PROVIDER_SITE_OTHER): Payer: Medicare Other

## 2012-06-30 ENCOUNTER — Other Ambulatory Visit (INDEPENDENT_AMBULATORY_CARE_PROVIDER_SITE_OTHER): Payer: Medicare Other

## 2012-06-30 VITALS — BP 151/80 | HR 82 | Ht 66.5 in | Wt 179.0 lb

## 2012-06-30 DIAGNOSIS — I10 Essential (primary) hypertension: Secondary | ICD-10-CM

## 2012-06-30 DIAGNOSIS — E782 Mixed hyperlipidemia: Secondary | ICD-10-CM | POA: Diagnosis not present

## 2012-06-30 LAB — BASIC METABOLIC PANEL
BUN: 17 mg/dL (ref 6–23)
CO2: 29 mEq/L (ref 19–32)
Calcium: 9.8 mg/dL (ref 8.4–10.5)
Chloride: 103 mEq/L (ref 96–112)
Creatinine, Ser: 0.6 mg/dL (ref 0.4–1.2)
GFR: 102.9 mL/min (ref >60.00)
Glucose, Bld: 110 mg/dL — ABNORMAL HIGH (ref 70–99)
Potassium: 3.7 mEq/L (ref 3.5–5.1)
Sodium: 139 mEq/L (ref 135–145)

## 2012-06-30 LAB — LIPID PANEL
Cholesterol: 127 mg/dL (ref 0–200)
HDL: 56.5 mg/dL (ref >39.00)
LDL Cholesterol: 58 mg/dL (ref 0–99)
Total CHOL/HDL Ratio: 2
Triglycerides: 65 mg/dL (ref 0.0–149.0)
VLDL: 13 mg/dL (ref 0.0–40.0)

## 2012-06-30 NOTE — Progress Notes (Signed)
**Note De-Identified Samantha Bishop Obfuscation** Pt arrives in office for a BP check.   At last OV with Dr. Graciela Husbands on 06/08/12 pt was advised to stop taking Metoprolol and start taking Diliazem 120 mg and Hydrochlorothiazide 12.5 mg daily to better control her bp and to have fasting lab work and BP check today.  Pt states that she took her meds this morning about an hour before visit. Her BP this morning is 151/80 and at last OV her BP was 172/89. Pt has no cardiac complaints at this time but is complaining of hair lose that she thinks is due to Simvastatin and she wants to go back to statin she was taking before changed to Simvastatin, which may have been Lipitor according to pt.    Pt is advised that we will contact her with Dr. Odessa Fleming recommendations, she verbalized understanding.

## 2012-07-08 DEATH — deceased

## 2012-07-10 ENCOUNTER — Telehealth: Payer: Self-pay | Admitting: Internal Medicine

## 2012-07-10 ENCOUNTER — Telehealth: Payer: Self-pay | Admitting: Pulmonary Disease

## 2012-07-10 NOTE — Telephone Encounter (Signed)
New Problem:    Patient called in wanting to know what the results of her latest labs were.  Please call back.

## 2012-07-10 NOTE — Telephone Encounter (Signed)
Provided patient with lab results from 06/30/12 (verbally and copy in mail), including Glucose of 110. Patient concerned with continued left leg swelling (without pain or other symptomology) and continued hair loss over past month. Patient would like MD to review medications as she does not want to be on "fluid pill" (HCTZ) and she feels medicine regimen from a year ago was better regarding her "hair loss concerns". Advised her to inform PCP of her concerns regarding leg swelling and hair loss also. Patient would like return phone call to discuss medications after MD reviews them in light of her recent labs.

## 2012-07-10 NOTE — Telephone Encounter (Signed)
Per SN- what is the name of the medication???  Spoke with pt- the name of the patch is climara and she wants brand name only since the other "just does'nt work for me" Please advise thanks!

## 2012-07-10 NOTE — Telephone Encounter (Signed)
SN would you be willing to fill the medication for the pts hot flashes since her GYN doctor is retiring next week?  Please advise. Thanks  Allergies  Allergen Reactions  . Codeine     REACTION: causes nausea  . Penicillins     REACTION: rash

## 2012-07-11 NOTE — Telephone Encounter (Signed)
The alopecia previously was thought to be related to her beta blocker. Reviewing the data sets, diltiazem is thought not to be a likely culprit with this problem. I agree that having her follow up with her PCP at this point is reasonable

## 2012-07-11 NOTE — Telephone Encounter (Signed)
Per SN----  SN cannot write for this brand name or otherwise.  She will need to get this from GYN.  Pt will need to be set up with another GYN.  Called and spoke with pt and she is aware of SN recs.  She stated that she will talk to [facility] in April at her appt but stated that if she has to see another GYN she would prefer to see a woman.  Will discuss at next ov.

## 2012-07-12 NOTE — Telephone Encounter (Signed)
PC to patient to provide her with Dr. Odessa Fleming response. Advised patient to follow up with her PCP. Encouraged her to call us with further concerns or questions.

## 2012-08-22 ENCOUNTER — Encounter: Payer: Self-pay | Admitting: Pulmonary Disease

## 2012-08-22 ENCOUNTER — Ambulatory Visit (INDEPENDENT_AMBULATORY_CARE_PROVIDER_SITE_OTHER): Payer: Medicare Other | Admitting: Pulmonary Disease

## 2012-08-22 ENCOUNTER — Other Ambulatory Visit (INDEPENDENT_AMBULATORY_CARE_PROVIDER_SITE_OTHER): Payer: Medicare Other

## 2012-08-22 VITALS — BP 138/72 | HR 80 | Temp 97.3°F | Ht 65.5 in | Wt 179.0 lb

## 2012-08-22 DIAGNOSIS — M545 Low back pain, unspecified: Secondary | ICD-10-CM

## 2012-08-22 DIAGNOSIS — F411 Generalized anxiety disorder: Secondary | ICD-10-CM

## 2012-08-22 DIAGNOSIS — M949 Disorder of cartilage, unspecified: Secondary | ICD-10-CM

## 2012-08-22 DIAGNOSIS — I671 Cerebral aneurysm, nonruptured: Secondary | ICD-10-CM

## 2012-08-22 DIAGNOSIS — I87303 Chronic venous hypertension (idiopathic) without complications of bilateral lower extremity: Secondary | ICD-10-CM

## 2012-08-22 DIAGNOSIS — I4891 Unspecified atrial fibrillation: Secondary | ICD-10-CM

## 2012-08-22 DIAGNOSIS — R7309 Other abnormal glucose: Secondary | ICD-10-CM

## 2012-08-22 DIAGNOSIS — I10 Essential (primary) hypertension: Secondary | ICD-10-CM

## 2012-08-22 DIAGNOSIS — E785 Hyperlipidemia, unspecified: Secondary | ICD-10-CM | POA: Diagnosis not present

## 2012-08-22 DIAGNOSIS — K219 Gastro-esophageal reflux disease without esophagitis: Secondary | ICD-10-CM

## 2012-08-22 DIAGNOSIS — I87309 Chronic venous hypertension (idiopathic) without complications of unspecified lower extremity: Secondary | ICD-10-CM

## 2012-08-22 DIAGNOSIS — M899 Disorder of bone, unspecified: Secondary | ICD-10-CM

## 2012-08-22 DIAGNOSIS — D126 Benign neoplasm of colon, unspecified: Secondary | ICD-10-CM

## 2012-08-22 DIAGNOSIS — K5730 Diverticulosis of large intestine without perforation or abscess without bleeding: Secondary | ICD-10-CM

## 2012-08-22 DIAGNOSIS — M1990 Unspecified osteoarthritis, unspecified site: Secondary | ICD-10-CM

## 2012-08-22 DIAGNOSIS — M1710 Unilateral primary osteoarthritis, unspecified knee: Secondary | ICD-10-CM

## 2012-08-22 LAB — TSH: TSH: 2.78 u[IU]/mL (ref 0.35–5.50)

## 2012-08-22 LAB — BASIC METABOLIC PANEL
BUN: 13 mg/dL (ref 6–23)
CO2: 28 mEq/L (ref 19–32)
Calcium: 9.3 mg/dL (ref 8.4–10.5)
Chloride: 104 mEq/L (ref 96–112)
Creatinine, Ser: 0.6 mg/dL (ref 0.4–1.2)
GFR: 100.89 mL/min (ref >60.00)
Glucose, Bld: 96 mg/dL (ref 70–99)
Potassium: 4.1 mEq/L (ref 3.5–5.1)
Sodium: 138 mEq/L (ref 135–145)

## 2012-08-22 LAB — CBC WITH DIFFERENTIAL/PLATELET
Basophils Absolute: 0 10*3/uL (ref 0.0–0.1)
Basophils Relative: 0.7 % (ref 0.0–3.0)
Eosinophils Absolute: 0 10*3/uL (ref 0.0–0.7)
Eosinophils Relative: 0.6 % (ref 0.0–5.0)
HCT: 39.5 % (ref 36.0–46.0)
Hemoglobin: 13.2 g/dL (ref 12.0–15.0)
Lymphocytes Relative: 32.9 % (ref 12.0–46.0)
Lymphs Abs: 2.3 10*3/uL (ref 0.7–4.0)
MCHC: 33.5 g/dL (ref 30.0–36.0)
MCV: 90.4 fl (ref 78.0–100.0)
Monocytes Absolute: 0.6 10*3/uL (ref 0.1–1.0)
Monocytes Relative: 8.3 % (ref 3.0–12.0)
Neutro Abs: 4 10*3/uL (ref 1.4–7.7)
Neutrophils Relative %: 57.5 % (ref 43.0–77.0)
Platelets: 203 10*3/uL (ref 150.0–400.0)
RBC: 4.36 Mil/uL (ref 3.87–5.11)
RDW: 13.6 % (ref 11.5–14.6)
WBC: 6.9 10*3/uL (ref 4.5–10.5)

## 2012-08-22 LAB — HEPATIC FUNCTION PANEL
ALT: 19 U/L (ref 0–35)
AST: 20 U/L (ref 0–37)
Albumin: 4.2 g/dL (ref 3.5–5.2)
Alkaline Phosphatase: 42 U/L (ref 39–117)
Bilirubin, Direct: 0.1 mg/dL (ref 0.0–0.3)
Total Bilirubin: 0.9 mg/dL (ref 0.3–1.2)
Total Protein: 7.3 g/dL (ref 6.0–8.3)

## 2012-08-22 LAB — HEMOGLOBIN A1C: Hgb A1c MFr Bld: 6.2 % (ref 4.6–6.5)

## 2012-08-22 MED ORDER — LIDOCAINE 5 % EX PTCH: 1.0000 | MEDICATED_PATCH | CUTANEOUS | Status: DC

## 2012-08-22 NOTE — Patient Instructions (Addendum)
Today we updated your med list in our EPIC system...    Continue your current medications the same...    We wrote a new prescription for the LIDODERM patches per your request...  Today we did your follow up non-fasting blood work...    We will contact you w/ the results when available...   Call for any questions...  Let's plan a follow up visit in 32mo, sooner if needed for problems.Marland KitchenMarland Kitchen

## 2012-08-22 NOTE — Progress Notes (Addendum)
Subjective:    Patient ID: Samantha Bishop, female    DOB: 11-Jan-1925, 79 y.o.   MRN: [id number]  HPI 79 y/o WF here for a follow up visit... She has mult medical problems including:  HBP;  Hx PAT- s/p ablation2003;  PAF on Pradaxa;  HxDVT-resolved;  Hx recurrent Cellulitis right ankle;  Hyperchol;  GERD/ Gastirits;  Divertics/ Colon polyps;  Hx Kidney stones;  DJD w/ bilat TKRs;  LBP;  Osteopenia;  NeckPain & HAs;  Intracranial Aneurysm;  Anxiety & chronic persistant insomnia...  ~  February 10, 2011:  76mo ROV - pt did not bring her meds to the OV today... C/o persisitent HAs & left ear pain (exam clear- given Auralgan)... She was HOSP 9/12 by [facility] w/ HA (see below)...    HBP> Dr Graciela Husbands substituted Lopressor 25mg Bid for her Diltiazem; his note of 8/12 is reviewed; BP= 140/90 but she feels better & doesn't want to switch back; c/o HAs but denies CP, palpit, SOB, or edema...    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    Hx of DVT/ Cellulitis> she saw DrGreenberg ([facility]) who planned both endovenous chemical & laser ablation (his noted from 7/12 is reviewed)...    Hyperlipid> on Simva20 per [facility]id Clinic; last FLP 5/12 reviewed & looked good on this med + diet etc...    GI> GERD, Divertics, Polyps> she uses prn PPI vs H2blockers & denies upper or lower GI symptoms; she does not want repeat colon given her age & Pradaxa rx...    DJD/ LBP> followed by DrWainer w/ right TKR 2004; she takes OTC analgesics prn...    Neck Pain & Chronic HAs> she was HOSP 9/22-25/12 by [facility] w/ severe HA, felt to be cervicogenic & tension related; she actually complains of chr daily HAs x yrs- this was the worst one yet, she says & precipitated by loud hi-pitched gospel singing; eval included CTBrain w/ atrophy, sm vessel dis, & right parietal cortical infarct (no  hemorrhage); CTAngio showed chr infarcts bilat, 4x22mm aneurysm in left cavernous carotid is unchanged from 4/11;  She states she was treated w/ Pred but she refused "it bloated me" (not mentioned in DCSummary); rec to take phys therapy but she declined "it didn't help in the past"; Given Robaxin & Percocet but she hasn't taken any since disch;  She has persistant cervicogenic headaches, known CSpine arthritis, & disappointed in DrRauck's treatment; rec referral to another pain clinic for management (==> she went to DrFreeman's HA clinic for Rx of her occip neuralgia),,,    Intracranial aneurysm> she had f/u CTAngio during the 9/12 admission for HA & the left cavernous carotid aneurysm is stable at 4x42mm (no change from 4/11)...    Anxiety> she was given some Valium in the hosp 9/12 but not taking any anxiolytic med as outpt, just Restoril 15mg  prn sleep...  ~  June 09, 2011:  87mo ROV & Samantha Bishop tells me she is planning left TKR 4/13 by DrAlusio & she has received cardiac clearance from DrKlein et al> chr AFib on Metoprolol & Pradaxa, prev hx DVT/ PE after knee surg, no known CAD; they rec transition to Xarelto in the post-op period; she had right TKR at age 88;  Her CC today is dry skin & itching> rec to use a good moisturizing cream & Rx w/ Atarax Qid,  Zyrtek 10mg /d, and Pepcid 20mg  Bid...    HBP> controlled on Metoprolol25mg Bid; BP= 150/92 here & better than this at home she says; denies CP, palpit, SOB, dizzy, etc...    AFib> followed by DrKlein on Pradaxa; they have cleared for the TKR surg...    Chol> on Simva20 + Fish oil; FLP last checked 5/12 & parameters all at goal, not fasting today, continue same...    DJD, LBP> she has Vicodin & Robaxin for prn use...    HA, neck pain> managed by DrFreeman's HA clinic now & improved after occipital shots...    Intracranial aneurysm> she had f/u CTA 9/12 & stable...  ~  August 22, 2012:  30mo ROV & Samantha Bishop describes a rough year> she had left TKR 4/13 by DrAlusio  & post-op rehab in [facility] but says she's had continued left knee pain & the [facility] staff was rough w/ her & injured her shoulders w/ subseq Ortho eval by DrWainer showing bilat rotator cuff tears & he's tried shots w/o much benefit... Then she's had prob w/ her meds c/o alopecia & DrKlein changed Metoprolol back to Diltiazem & added HCT for her edema & BP... We reviewed the following medical problems during today's office visit >>     HBP> on DiltiazemCD120, HCTZ25-1/2 daily; BP= 138/72 & she denies HA, visual symptoms, CP, palpit, ch in SOB or edema...    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    HxDVT/cellulitis, VI/edema> she saw DrGreenberg ([facility]) who planned both endovenous chemical & laser ablation but she says he did vein stripping & charged her $6000 (upset); she has chr VI & edema L>R and we reviewed no salt, elevation, support hose.    Hyperlipid> on Simva20-1/2 tab daily; FLP 2/14 by DrKlein showed TChol 127, TG 65, HDL 57, LDL 58    GI- GERD, Divertics, Polyp> she uses prn PPI vs H2blockers & denies upper or lower GI symptoms; she does not want repeat colon given her age & Pradaxa rx.    DJD, LBP, s/p bilat TKRs, bilat rotator cuff tears> on Tylenol as needed & requests LIDODERM patches;     Hx neck pain & chr HAs> she only uses Tylenol & OTC meds for pain; she was [facility] 9/22-25/12 by [facility] w/ severe HA, felt to be cervicogenic & tension related (see below); not currently c/o neck pain or HAs...    Intracranial aneurysm> she had f/u CTAngio during the 9/12 admission for HA & the left cavernous carotid aneurysm is stable at 4x20mm (no change from 4/11); she denies cerebral ischemia symptoms...    Anxiety> not currently on anxiolytic Rx; she is c/o no energy etc... We reviewed prob list, meds, xrays and labs> see below for updates  >>  LABS 2/14:  FLP- at goals on Simva10;  BMet- ok x BS=110... LABS 4/14:  Chems- ok w/ BS=96 A1c=6.2;  CBC- wnl;  TSH=2.78;  VitD=22 (rec 2000u daily OTC supplement)...  ADDENDUM>> 10/16/12>> series of phone calls regarding recurrent occipital headaches & she wants Gabapentin as rec by a friend- I offered Gabapentin 100mg  Tid but she called back requesting 200mg Bid (more in line w/ her friend's dose I suppose); she needs referral to HA management clinic to consider occipital injection therapy...        Problem List:  SINUSITIS, ACUTE (ICD-461.9) - she uses  Nasal Saline Mist & FLONASE as needed... Prev eval DrRosen for ENT w/ deviated septum found, otherw neg exam, but it's hard for her to breathe thru her nose... ~  9/12: c/o left ear pain; neg exam- given Auralgan drops for prn use... ~  CT Sinus 12/12 showed mild mucosal membrane thickening, retention cysts along both max sinus floors, no advanced sinusitis or A/F levels...  HYPERTENSION (ICD-401.9) - controlled on LOPRESSOR 50mg - 1/2 Bid now... ~  Baseline EKG= NSR, WNL... ~  2DEcho 1996 showed mild LVH, norm valves, EF=60%... ~  NuclearStressTest 4/04 was neg- no infarct or ischemia, EF=69%... similiar to 1999 study. ~  2DEcho 10/10 showed norm LV w/ EF=55-65%, mild AoV thickening, triv MR, LA=38mm... ~  8/12:  DrKlein switched her Diltiazem120 to Lopressor25Bid due to her c/o fatigue... ~  CXR 12/12 showed cardiomeg, no signs CHF, clear lungs, NAD... ~  1/14: DrKlein changed Metoprolol to DiltiazemCD120 + HCT12.5 due to Alopecia & elevBP/ edema... ~  4/14:  on DiltiazemCD120, HCTZ25-1/2 daily; BP= 138/72 & she denies HA, visual symptoms, CP, palpit, ch in SOB or edema.  Hx of TACHYARRHYTHMIA (PAT) & ATRIAL FIBRILLATION> ~  hx atrial tachycardia and AV nodal reentry in 2003... s/p ablation, no recurrent symptoms...  ~  referred to [facility] 10/10 w/ incr palpit & Holter Monitor showed PAF; DrKlein started PRADAXA 150mg Bid. ~  8/12:   DrKlein switched her Diltiazem120 to Lopressor25Bid but to her c/o fatigue... ~  1/14: she had f/u DrKlein> AFib, palpit, ChadsVasc=4 on Pradaxa, rate control switched from BBlocker to DiltiazemCD120 due to c/o alopecia... ~  EKG 1/14 showed AFib, rate 85, low voltage, poor R prog, NSSTTWA...  Hx of DEEP VENOUS THROMBOPHLEBITIS (ICD-453.40)  Hx of CELLULITIS, ANKLE (ICD-682.6) - she had a DVT and a small PE after knee surgery in 1997... subseq eval of her veins by the vasc surgeons showed venous stasis disease...  developed cellulitis & ?of superficial thrombophlebitis 12/10- hosp transiently & VenDopplers neg for DVT, treated w/ BacterimDS, Naprosyn, Percocet & initially improved...  ~  1/11:  DrWainer sent her to DrFitzgerald for recurrent cellulitis RLE & he started prolonged course of Keflex & local wraps... ~  10/11:  She remains on suppressive therapy w/ Keflex 500mg /d from DrFitzgerald. ~  4/12:  She reports stopping the Keflex on her own ~1/12 & states no recurrent prob so far, doing OK. ~  7/12:  Note from DrGreenberg, [facility]- plans endovenous chemical & laser ablation procedures... ~  she saw DrGreenberg ([facility]) who planned both endovenous chemical & laser ablation but she says he did vein stripping & charged her $6000 (upset); she has chr VI & edema L>R and we reviewed no salt, elevation, support hose.  HYPERLIPIDEMIA (ICD-272.4) - prev on Lipitor10 + FishOil 2/d... Lipid clinic ch to SIMVASTATIN for $$ reasons. ~  Lipid Clinic f/u 11/10 on Lip10 + FishOil 2/d showed TChol 143, TG 72, HDL 59, LDL 70 ~  FLP 4/11 on Simva20 showed TChol 130, TG 54, HDL 64, LDL 55 ~  FLP 10/11 on Simva20 showed TChol 122, TG 51, HDL 59, LDL 53 ~  ?Lipid Clinic decr her Simva20 to 10mg /d due to Diltiazem rx... ~  FLP 5/12 on Simva10 showed TChol 163, TG 94, HDL 64, LDL 81  ~  FLP 2/14 on simva10 showed TChol 127, TG 65, HDL 57, LDL 58  GERD (ICD-530.81) - last EGD was 3/06 showing chr gastritis  & duodenitis... prev on Aciphex20, now Prn Pepcid vs  Prilosec...  DIVERTICULOSIS OF COLON (ICD-562.10)   COLONIC POLYPS (ICD-211.3) - last colonoscopy was 1/07 by DrSam showing divertics only... f/u planned 51yrs... (last polyp was 1999 tiny & cauterized). ~  1/11:  c/o bowel irregularity & some blood seen after hosp for cellulitis- Rx w/ Align/ Activia/ Proctocort cream. ~  3/12:  Now 79 y/o & she doesn't want repeat colonoscopy while on Pradaxa...  Hx of RENAL CALCULUS (ICD-592.0) - stones seen in kidney on CTAbd 2007, non-obstructing & no hx of symptomatic nephrolithiasis. ~  4/13: she had UTI w/ cult pos for Staph per Urology; treated w/ doxy & resolved; she was INTOL to cipro which made her sick she says...  DEGENERATIVE JOINT DISEASE (ICD-715.90) >>  ~  prev hx of arthroscopy w/ post-op DVT and PTE in 1995...  ~  she had a right TKR by DrWainer 4/04> apparently she had arrhythmia post op "heart stopped for 18sec & DrWainer said I can't be put to sleep no more" ~  4/13: she had left TKR DrAlusio w/ spinal anesthesia & went to [facility] for [facility] but had a rough time & she notes that they injured her rotator cuffs moving her in bed... ~  12/13: she had f/u DrWainer for Ortho- persist left shoulder pain 7 MRI revealed rotator cuff tear; given injections w/ min relief...  LOW BACK PAIN SYNDROME (ICD-724.2) - she has had LBP evauated by Ortho- DrWainer, DrGioffre; and by Neurosurg- DrJenkins in 2003... MRI showed a disc at L3-4 w/ mild spinal stenosis, but he did not rec surg...  ~  4/14: she is requesting Rx for Lidoderm patches to use as needed- ok...  OSTEOPENIA (ICD-733.90) - BMD followed by DrLomax, Gyn> -1.6 in [facility]... on Climara0.3 patch, Calcium, VitD. ~  Lab 4/14 showed Vit D = 22 & rec to incr to 2000u OTC supplement daily...  NECK PAIN & HEADACHE - Dx w/ occip neuralgia & cervical degen disc dis w/ prev eval DrWeymann in 2009 & another eval at [facility] 7/10- treated w/ bilat occip nerve  blocks 9/10 & improved... refered to DrRauck to continue this therapy closer to home. ~  4/12:  She reports that DrRauck tried shots for her cervicogenic HAs but they didn't help, she now uses OTC analgesics as needed. ~  9/12:  HOSP by [facility] w/ HA-  felt to be cervicogenic & tension related; she actually complains of chr daily HAs x yrs- this was the worst one yet; eval included CTBrain w/ atrophy, sm vessel dis, & right parietal cortical infarct (no hemorrhage); CTAngio showed chr infarcts bilat, 4x64mm aneurysm in left cavernous carotid is unchanged from 4/11;  She states she was treated w/ Pred but she refused "it bloated me" (not mentioned in DCSummary); rec to take phys therapy but she declined "it didn't help in the past"; Given Robaxin & Percocet but she hasn't taken any since disch;  She has persistant cervicogenic headaches, known CSpine arthritis, & disappointed in DrRauck's prev treatment; rec referral to another pain clinic for management... ~  4/14: she denies much recent neck pain or HA- using OTC analgesics like Tylenol prn...  INTRACRANIAL ANEURYSM (ICD-437.3) - MRI/ MRA done 3/09 for HA eval revealed atophy, sm vessel dis, and an incidental 5mm aneurysm projecting posteriorly from the left ICA (cavernous segment)...  we contacted her relatives at [facility] and sent her scan and notes to Dr. Annamary Rummage at the [facility] [phone] [address] [place] [address]... they requested that we  perform a CT Angio- done 09/08/07 and showed a 5mm aneurysm projecting posteriorly from the junction of the cavernous and supraclinoid segments of the left ICA...   ~  f/u CTAngio 04/08/08 showed no significant change- she tells me that if she needs surg she wants to go to [facility] to see [facility] doctor...  ~  CDopplers 2010 showed calcif plaque in ICA's 0-39% stenoses. ~  f/u CTAngio 4/11 showed stable left cavernous ICA aneurysm measuring 4 x 5mm. ~  f/u CTAngio 9/12 showed chr  infarcts bilat, 4x62mm aneurysm in left cavernous carotid is unchanged from 4/11... ~  4/14: she denies neurologic sequelae or cerebral ischemia symptoms...  ANXIETY (ICD-300.00) & INSOMNIA, CHRONIC (ICD-307.42) - she notes that Ambien 10mg  w/o help and wants something stronger- try RESTORIL 15mg  Qhs Prn... ~  4/14: not currently requiring anxiolytic rx or sleep aide...  GYN = DrLomax & seen 5/11... on CLIMARA 0.3 patch & Ca++/ VitD... he did BMD in 10/07 w/ osteopenia, & f/u 5/11 showed min deterioration w/ TScores -0.8 in spine, and -1.6 in [facility]... ~  4/14: DrLomax has retired & she needs another Gyn for her Pap, Mammograms, BMDs, & Climara refills ("I need the Climara since the generic doesn't work for me")...   Past Surgical History  Procedure Laterality Date  . Tonsillectomy and adenoidectomy    . Appendectomy    . Abdominal hysterectomy    . Knee surgery    . Breast cyst excision    . Blocked intestine surgery    . Knee arthroscopy      X2 L KNEE  . Joint replacement  2004    RT TOTAL KNEE  . Nasal surgery x2    . Hemhorroidectomy    . Total knee arthroplasty  09/03/2011    Procedure: TOTAL KNEE ARTHROPLASTY;  Surgeon: Loanne Drilling, MD;  Location: [facility]S;  Service: Orthopedics;  Laterality: Left;    Outpatient Encounter Prescriptions as of 08/22/2012  Medication Sig Dispense Refill  . acetaminophen (TYLENOL) 325 MG tablet Take 2 tablets (650 mg total) by mouth every 6 (six) hours as needed (or Fever >/= 101).  60 tablet  0  . dabigatran (PRADAXA) 150 MG CAPS Take 150 mg by mouth 2 (two) times daily.      Marland Kitchen diltiazem (CARDIZEM CD) 120 MG 24 hr capsule Take 1 capsule (120 mg total) by mouth daily.  90 capsule  3  . hydrochlorothiazide (HYDRODIURIL) 25 MG tablet Take 1/2 tablet by mouth once daily.  45 tablet  3  . simvastatin (ZOCOR) 20 MG tablet Take 1/2 tablet by mouth once daily      . Tetrahydrozoline-Zn Sulfate (EYE DROPS RELIEF OP) Apply 1 drop to eye 3 (three) times  daily as needed. For dry eyes       No facility-administered encounter medications on file as of 08/22/2012.    Allergies  Allergen Reactions  . Codeine     REACTION: causes nausea  . Penicillins     REACTION: rash    Current Medications, Allergies, Past Medical History, Past Surgical History, Family History, and Social History were reviewed in [employer] record.    Review of Systems        See HPI - all other systems neg except as noted...      The patient complains of dyspnea on exertion and sl peripheral edema.  The patient denies anorexia, fever, weight loss, weight gain, vision loss, decreased hearing, hoarseness, chest pain, syncope, prolonged  cough, headaches, hemoptysis, abdominal pain, melena, hematochezia, severe indigestion/heartburn, hematuria, incontinence, muscle weakness, suspicious skin lesions, transient blindness, difficulty walking, depression, unusual weight change, abnormal bleeding, enlarged lymph nodes, and angioedema.     Objective:   Physical Exam     WD, WN, 79 y/o WF in NAD... GENERAL:  Alert & oriented; pleasant & cooperative... HEENT:  [place]/AT, EOM-wnl, PERRLA, EACs-clear, TMs-wnl, NOSE- sl red, THROAT- no exud seen... NECK:  Supple w/ decrROM; no JVD; normal carotid impulses w/o bruits; no thyromegaly or nodules palpated; no lymphadenopathy. CHEST:  Clear to P & A; without wheezes/ rales/ or rhonchi. HEART:  Regular Rhythm; msc gr 1/6 SEM without rubs or gallops heard... ABDOMEN:  Soft & nontender; normal bowel sounds; no organomegaly or masses detected. EXT: without deformities, mod arthritic changes; no varicose veins/ +venous insuffic/ tr edema. Right lower leg wrapped, support hose, less discomfort... NEURO:  CN's intact;  no focal neuro deficit... DERM:  No lesions noted; no rash etc... just chr ven insuffic changes in LE's...  RADIOLOGY DATA:  Reviewed in the EPIC EMR & discussed w/ the patient...    >>CXR 12/12 showed  cardiomeg, clear lungs, NAD...    >>Sinus CT 12/12 showed bilat retention cysts along the floor of the max sinuses, mild mucosal thickening, NAD...  LABORATORY DATA:  Reviewed in the EPIC EMR & discussed w/ the patient...   Assessment & Plan:    HBP>  Controlled on the Diltiazem120 & HCT12.5 + her diet etc;  States that BPs are better at home, continue same...  PAF>  Followed by DrKlein & stable on the Pradaxa & Diltiazem for rate control...  Hx of DVT, cellulitis, VI, edema>  Off the Keflex suppressive therapy & continues to do well w/o recurrent infection, pain, etc; notes swelling L>R w/ her VI & prev vein stripping...  CHOL>  stable on Simva20-1/2Qd w/ good FLP 2/14.Marland Kitchen  GI>  Stable on prn meds and she is not inclined to pursue another colonoscopy...  DJD, LBP, rotator cuff problems>  Followed by DrWainer for Ortho, she requests Lidoderm patches for back- ok...  HEADACHES>  HAs improved w/ treatment via DrFreeman's HA management clinic in the past...  ANEURYSM>  Clinically stable w/o vasc or cerebral ischemic symptoms;  CTA 9/12 reviewed & stable...  ANXIETY>  She is stable w/o medication at present...   Patient's Medications  New Prescriptions   LIDOCAINE (LIDODERM) 5 %    Place 1 patch onto the skin daily. Remove & Discard patch within 12 hours or as directed by MD  Previous Medications   ACETAMINOPHEN (TYLENOL) 325 MG TABLET    Take 2 tablets (650 mg total) by mouth every 6 (six) hours as needed (or Fever >/= 101).   DABIGATRAN (PRADAXA) 150 MG CAPS    Take 150 mg by mouth 2 (two) times daily.   DILTIAZEM (CARDIZEM CD) 120 MG 24 HR CAPSULE    Take 1 capsule (120 mg total) by mouth daily.   HYDROCHLOROTHIAZIDE (HYDRODIURIL) 25 MG TABLET    Take 1/2 tablet by mouth once daily.   SIMVASTATIN (ZOCOR) 20 MG TABLET    Take 1/2 tablet by mouth once daily   TETRAHYDROZOLINE-ZN SULFATE (EYE DROPS RELIEF OP)    Apply 1 drop to eye 3 (three) times daily as needed. For dry eyes   Modified Medications   No medications on file  Discontinued Medications   No medications on file

## 2012-08-23 LAB — VITAMIN D 25 HYDROXY (VIT D DEFICIENCY, FRACTURES): Vit D, 25-Hydroxy: 22 ng/mL — ABNORMAL LOW (ref 30–89)

## 2012-09-12 DIAGNOSIS — M1710 Unilateral primary osteoarthritis, unspecified knee: Secondary | ICD-10-CM | POA: Diagnosis not present

## 2012-09-12 DIAGNOSIS — IMO0002 Reserved for concepts with insufficient information to code with codable children: Secondary | ICD-10-CM | POA: Diagnosis not present

## 2012-10-05 DIAGNOSIS — M5137 Other intervertebral disc degeneration, lumbosacral region: Secondary | ICD-10-CM | POA: Diagnosis not present

## 2012-10-12 DIAGNOSIS — M5137 Other intervertebral disc degeneration, lumbosacral region: Secondary | ICD-10-CM | POA: Diagnosis not present

## 2012-10-16 ENCOUNTER — Telehealth: Payer: Self-pay | Admitting: Pulmonary Disease

## 2012-10-16 MED ORDER — GABAPENTIN 100 MG PO CAPS: 200.0000 mg | ORAL_CAPSULE | Freq: Two times a day (BID) | ORAL | Status: DC

## 2012-10-16 NOTE — Telephone Encounter (Signed)
Per SN---  Gabapentin 100 mg  1 po TID  #90 Will need follow up with her pain doctor/HA specialist.

## 2012-10-16 NOTE — Telephone Encounter (Signed)
Called, spoke with pt.  Informed her of below per Dr. Kriste Basque. Pt states she doesn't have a pain dr or a HA specialist.  Dr. Kriste Basque, pls advise if you would like to make a referral. Also, pt is requesting for the gabapentin to be changed to 200 mg bid. Dr. Kriste Basque, pls advise.  Thank you.  [facility]

## 2012-10-16 NOTE — Telephone Encounter (Signed)
Called spoke with patient who reports a flare in her Occipital Neuralgia x4 days with neck/back pain that feels like "knives stabbing her".  Using Lidoderm patches with little relief.  Pt stated that SN has given her oxycodone in the past that has helped and she had heard from a friend with the same diagnosis that gabapentin helps.  Pt is requesting SN's recs.  Last ov 5.14.14 [facility] Allergies  Allergen Reactions  . Codeine     REACTION: causes nausea  . Penicillins     REACTION: rash   Dr Kriste Basque please advise, thank you.

## 2012-10-16 NOTE — Addendum Note (Signed)
Addended by: Reynaldo Minium C on: 10/16/2012 05:50 PM   Modules accepted: Orders

## 2012-10-16 NOTE — Telephone Encounter (Signed)
Per SN- we will set up appt at the HA Clinic-Dr [facility] Referral was sent to [facility] Pt aware

## 2012-10-17 ENCOUNTER — Telehealth: Payer: Self-pay | Admitting: Pulmonary Disease

## 2012-10-17 ENCOUNTER — Other Ambulatory Visit: Payer: Self-pay | Admitting: Pulmonary Disease

## 2012-10-17 DIAGNOSIS — R51 Headache: Secondary | ICD-10-CM

## 2012-10-17 NOTE — Telephone Encounter (Signed)
Spoke to both pt and [email or web address]  ctr and pt is aware she nees this appt Tobe Sos

## 2012-10-18 DIAGNOSIS — M5137 Other intervertebral disc degeneration, lumbosacral region: Secondary | ICD-10-CM | POA: Diagnosis not present

## 2012-10-19 ENCOUNTER — Other Ambulatory Visit: Payer: Self-pay | Admitting: Internal Medicine

## 2012-10-19 DIAGNOSIS — H10509 Unspecified blepharoconjunctivitis, unspecified eye: Secondary | ICD-10-CM | POA: Diagnosis not present

## 2012-11-09 ENCOUNTER — Other Ambulatory Visit: Payer: Self-pay | Admitting: Pulmonary Disease

## 2012-11-16 DIAGNOSIS — M5137 Other intervertebral disc degeneration, lumbosacral region: Secondary | ICD-10-CM | POA: Diagnosis not present

## 2012-11-21 DIAGNOSIS — H04129 Dry eye syndrome of unspecified lacrimal gland: Secondary | ICD-10-CM | POA: Diagnosis not present

## 2012-11-30 DIAGNOSIS — H16229 Keratoconjunctivitis sicca, not specified as Sjogren's, unspecified eye: Secondary | ICD-10-CM | POA: Diagnosis not present

## 2012-12-05 DIAGNOSIS — H16329 Diffuse interstitial keratitis, unspecified eye: Secondary | ICD-10-CM | POA: Diagnosis not present

## 2012-12-12 DIAGNOSIS — H16329 Diffuse interstitial keratitis, unspecified eye: Secondary | ICD-10-CM | POA: Diagnosis not present

## 2012-12-12 DIAGNOSIS — Z961 Presence of intraocular lens: Secondary | ICD-10-CM | POA: Diagnosis not present

## 2012-12-12 DIAGNOSIS — H02409 Unspecified ptosis of unspecified eyelid: Secondary | ICD-10-CM | POA: Diagnosis not present

## 2012-12-18 ENCOUNTER — Other Ambulatory Visit: Payer: Self-pay | Admitting: Internal Medicine

## 2013-02-21 ENCOUNTER — Encounter: Payer: Self-pay | Admitting: Pulmonary Disease

## 2013-02-21 ENCOUNTER — Ambulatory Visit (INDEPENDENT_AMBULATORY_CARE_PROVIDER_SITE_OTHER): Payer: Medicare Other | Admitting: Pulmonary Disease

## 2013-02-21 VITALS — BP 120/78 | HR 76 | Temp 98.0°F | Ht 65.5 in | Wt 186.0 lb

## 2013-02-21 DIAGNOSIS — I87309 Chronic venous hypertension (idiopathic) without complications of unspecified lower extremity: Secondary | ICD-10-CM

## 2013-02-21 DIAGNOSIS — M542 Cervicalgia: Secondary | ICD-10-CM

## 2013-02-21 DIAGNOSIS — I671 Cerebral aneurysm, nonruptured: Secondary | ICD-10-CM

## 2013-02-21 DIAGNOSIS — I10 Essential (primary) hypertension: Secondary | ICD-10-CM

## 2013-02-21 DIAGNOSIS — D126 Benign neoplasm of colon, unspecified: Secondary | ICD-10-CM

## 2013-02-21 DIAGNOSIS — M1990 Unspecified osteoarthritis, unspecified site: Secondary | ICD-10-CM

## 2013-02-21 DIAGNOSIS — M899 Disorder of bone, unspecified: Secondary | ICD-10-CM

## 2013-02-21 DIAGNOSIS — K5730 Diverticulosis of large intestine without perforation or abscess without bleeding: Secondary | ICD-10-CM

## 2013-02-21 DIAGNOSIS — M1710 Unilateral primary osteoarthritis, unspecified knee: Secondary | ICD-10-CM

## 2013-02-21 DIAGNOSIS — E785 Hyperlipidemia, unspecified: Secondary | ICD-10-CM | POA: Diagnosis not present

## 2013-02-21 DIAGNOSIS — I4891 Unspecified atrial fibrillation: Secondary | ICD-10-CM | POA: Diagnosis not present

## 2013-02-21 DIAGNOSIS — M545 Low back pain, unspecified: Secondary | ICD-10-CM

## 2013-02-21 DIAGNOSIS — I87303 Chronic venous hypertension (idiopathic) without complications of bilateral lower extremity: Secondary | ICD-10-CM

## 2013-02-21 DIAGNOSIS — Z23 Encounter for immunization: Secondary | ICD-10-CM | POA: Diagnosis not present

## 2013-02-21 DIAGNOSIS — F411 Generalized anxiety disorder: Secondary | ICD-10-CM

## 2013-02-21 DIAGNOSIS — K219 Gastro-esophageal reflux disease without esophagitis: Secondary | ICD-10-CM

## 2013-02-21 MED ORDER — HYDROCODONE-ACETAMINOPHEN 5-325 MG PO TABS: 1.0000 | ORAL_TABLET | Freq: Three times a day (TID) | ORAL | Status: DC | PRN

## 2013-02-21 MED ORDER — DICYCLOMINE HCL 10 MG PO CAPS: 10.0000 mg | ORAL_CAPSULE | Freq: Two times a day (BID) | ORAL | Status: DC

## 2013-02-21 MED ORDER — ATORVASTATIN CALCIUM 10 MG PO TABS: 10.0000 mg | ORAL_TABLET | Freq: Every day | ORAL | Status: DC

## 2013-02-21 NOTE — Progress Notes (Signed)
Subjective:    Patient ID: Samantha Bishop, female    DOB: 06/05/1924, 79 y.o.   MRN: [id number]  HPI 79 y/o WF here for a follow up visit... She has mult medical problems including:  HBP;  Hx PAT- s/p ablation2003;  PAF on Pradaxa;  HxDVT-resolved;  Hx recurrent Cellulitis right ankle;  Hyperchol;  GERD/ Gastirits;  Divertics/ Colon polyps;  Hx Kidney stones;  DJD w/ bilat TKRs;  LBP;  Osteopenia;  NeckPain & HAs;  Intracranial Aneurysm;  Anxiety & chronic persistant insomnia...  ~  June 09, 2011:  6mo ROV & Samantha Bishop tells me she is planning left TKR 4/13 by DrAlusio & she has received cardiac clearance from DrKlein et al> chr AFib on Metoprolol & Pradaxa, prev hx DVT/ PE after knee surg, no known CAD; they rec transition to Xarelto in the post-op period; she had right TKR at age 52;  Her CC today is dry skin & itching> rec to use a good moisturizing cream & Rx w/ Atarax Qid, Zyrtek 10mg /d, and Pepcid 20mg  Bid...    HBP> controlled on Metoprolol25mg Bid; BP= 150/92 here & better than this at home she says; denies CP, palpit, SOB, dizzy, etc...    AFib> followed by DrKlein on Pradaxa; they have cleared for the TKR surg...    Chol> on Simva20 + Fish oil; FLP last checked 5/12 & parameters all at goal, not fasting today, continue same...    DJD, LBP> she has Vicodin & Robaxin for prn use...    HA, neck pain> managed by DrFreeman's HA clinic now & improved after occipital shots...    Intracranial aneurysm> she had f/u CTA 9/12 & stable...  ~  August 22, 2012:  16mo ROV & Samantha Bishop describes a rough year> she had left TKR 4/13 by DrAlusio & post-[place]hab in [place] but says she's had continued left knee pain & the [facility] staff was rough w/ her & injured her shoulders w/ subseq Ortho eval by DrWainer showing bilat rotator cuff tears & he's tried shots w/o much benefit... Then she's had prob w/ her meds c/o alopecia & DrKlein changed Metoprolol back to Diltiazem & added HCT for her edema & BP... We reviewed the following  medical problems during today's office visit >>     HBP> on DiltiazemCD120, HCTZ25-1/2 daily; BP= 138/72 & she denies HA, visual symptoms, CP, palpit, ch in SOB or edema...    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    HxDVT/cellulitis, VI/edema> she saw DrGreenberg ([facility]) who planned both endovenous chemical & laser ablation but she says he did vein stripping & charged her $6000 (upset); she has chr VI & edema L>R and we reviewed no salt, elevation, support hose.    Hyperlipid> on Simva20-1/2 tab daily; FLP 2/14 by DrKlein showed TChol 127, TG 65, HDL 57, LDL 58    GI- GERD, Divertics, Polyp> she uses prn PPI vs H2blockers & denies upper or lower GI symptoms; she does not want repeat colon given her age & Pradaxa rx.    DJD, LBP, s/p bilat TKRs, bilat rotator cuff tears> on Tylenol as needed & requests LIDODERM patches;     Hx neck pain & chr HAs> she only uses Tylenol & OTC meds for pain; she was [facility]/22-25/12 by [facility] w/ severe HA, felt to be cervicogenic &  tension related (see below); not currently c/o neck pain or HAs...    Intracranial aneurysm> she had f/u CTAngio during the 9/12 admission for HA & the left cavernous carotid aneurysm is stable at 4x49mm (no change from 4/11); she denies cerebral ischemia symptoms...    Anxiety> not currently on anxiolytic Rx; she is c/o no energy etc... We reviewed prob list, meds, xrays and labs> see below for updates >>  LABS 2/14:  FLP- at goals on Simva10;  BMet- ok x BS=110... LABS 4/14:  Chems- ok w/ BS=96 A1c=6.2;  CBC- wnl;  TSH=2.78;  VitD=22 (rec 2000u daily OTC supplement)... ADDENDUM>> 10/16/12>> series of phone calls regarding recurrent occipital headaches & she wants Gabapentin as rec by a friend- I offered Gabapentin 100mg  Tid but she called back requesting 200mg Bid (more in  line w/ her friend's dose I suppose); she needs referral to HA management clinic to consider occipital injection therapy...  ~  February 21, 2013:  24mo ROV & Samantha Bishop is here for f/u mult medical issues>     Hx HBP, AFib> on CardizemCD120, HCT25-1/2 & Pradaxa150Bid; BP= 120/78, followed by DrKlein- notes reviewed...    Chol> prev on Simva10 but she states she's been reading [facility] [employer] & "lots of folks have trouble w/ simvastatin" so we agreed to ch to Atorva10    She states "I worry about my stomach" prev constip, now "sore" & we discussed Atign/ Activia and Bentyl10Bid    She sawDrRamos 7/14 for f/u back & neck pain> DrRamos did prolotherapy to neck region, known multilevel DDD in lumbar region and facet arthropathy; on Pradaxa for AFib; s/p bilat TKRs... She states "my legs kill me" & she wants Vicodin...    She has chronic HAs & occipital neuralgia monitored and treated by DrFreeman; given trigger pt injections and trial Neurontin; she wanted Botox but told this would be self-pay; uses Lidodern patches as needed... We reviewed prob list, meds, xrays and labs> see below for updates >>         Problem List:  SINUSITIS, ACUTE (ICD-461.9) - she uses Nasal Saline Mist & FLONASE as needed... Prev eval DrRosen for ENT w/ deviated septum found, otherw neg exam, but it's hard for her to breathe thru her nose... ~  9/12: c/o left ear pain; neg exam- given Auralgan drops for prn use... ~  CT Sinus 12/12 showed mild mucosal membrane thickening, retention cysts along both max sinus floors, no advanced sinusitis or A/F levels...  HYPERTENSION (ICD-401.9) - controlled on LOPRESSOR 50mg - 1/2 Bid now... ~  Baseline EKG= NSR, WNL... ~  2DEcho 1996 showed mild LVH, norm valves, EF=60%... ~  NuclearStressTest 4/04 was neg- no infarct or ischemia, EF=69%... similiar to 1999 study. ~  2DEcho 10/10 showed norm LV w/ EF=55-65%, mild AoV thickening, triv MR, LA=28mm... ~  8/12:  DrKlein switched her Diltiazem120  to Lopressor25Bid due to her c/o fatigue... ~  CXR 12/12 showed cardiomeg, no signs CHF, clear lungs, NAD... ~  1/14: DrKlein changed Metoprolol to DiltiazemCD120 + HCT12.5 due to Alopecia & elevBP/ edema... ~  4/14:  on DiltiazemCD120, HCTZ25-1/2 daily; BP= 138/72 & she denies HA, visual symptoms, CP, palpit, ch in SOB or edema.  Hx of TACHYARRHYTHMIA (PAT) & ATRIAL FIBRILLATION> ~  hx atrial tachycardia and AV nodal reentry in 2003... s/p ablation, no recurrent symptoms...  ~  referred to [facility] 10/10 w/ incr palpit & Holter Monitor showed PAF; DrKlein started PRADAXA 150mg Bid. ~  8/12:  DrKlein switched her Diltiazem120  to Lopressor25Bid but to her c/o fatigue... ~  1/14: she had f/u DrKlein> AFib, palpit, ChadsVasc=4 on Pradaxa, rate control switched from BBlocker to DiltiazemCD120 due to c/o alopecia... ~  EKG 1/14 showed AFib, rate 85, low voltage, poor R prog, NSSTTWA...  Hx of DEEP VENOUS THROMBOPHLEBITIS (ICD-453.40)  Hx of CELLULITIS, ANKLE (ICD-682.6) - she had a DVT and a small PE after knee surgery in 1997... subseq eval of her veins by the vasc surgeons showed venous stasis disease...  developed cellulitis & ?of superficial thrombophlebitis 12/10- hosp transiently & VenDopplers neg for DVT, treated w/ BacterimDS, Naprosyn, Percocet & initially improved...  ~  1/11:  DrWainer sent her to DrFitzgerald for recurrent cellulitis RLE & he started prolonged course of Keflex & local wraps... ~  10/11:  She remains on suppressive therapy w/ Keflex 500mg /d from DrFitzgerald. ~  4/12:  She reports stopping the Keflex on her own ~1/12 & states no recurrent prob so far, doing OK. ~  7/12:  Note from DrGreenberg, [facility]- plans endovenous chemical & laser ablation procedures... ~  she saw DrGreenberg ([facility]) who planned both endovenous chemical & laser ablation but she says he did vein stripping & charged her $6000 (upset); she has chr VI & edema L>R and we reviewed no salt,  elevation, support hose.  HYPERLIPIDEMIA (ICD-272.4) - prev on Lipitor10 + FishOil 2/d... Lipid clinic ch to SIMVASTATIN for $$ reasons. ~  Lipid Clinic f/u 11/10 on Lip10 + FishOil 2/d showed TChol 143, TG 72, HDL 59, LDL 70 ~  FLP 4/11 on Simva20 showed TChol 130, TG 54, HDL 64, LDL 55 ~  FLP 10/11 on Simva20 showed TChol 122, TG 51, HDL 59, LDL 53 ~  ?Lipid Clinic decr her Simva20 to 10mg /d due to Diltiazem rx... ~  FLP 5/12 on Simva10 showed TChol 163, TG 94, HDL 64, LDL 81  ~  FLP 2/14 on simva10 showed TChol 127, TG 65, HDL 57, LDL 58  GERD (ICD-530.81) - last EGD was 3/06 showing chr gastritis & duodenitis... prev on Aciphex20, now Prn Pepcid vs Prilosec...  DIVERTICULOSIS OF COLON (ICD-562.10)   COLONIC POLYPS (ICD-211.3) - last colonoscopy was 1/07 by DrSam showing divertics only... f/u planned 75yrs... (last polyp was 1999 tiny & cauterized). ~  1/11:  c/o bowel irregularity & some blood seen after hosp for cellulitis- Rx w/ Align/ Activia/ Proctocort cream. ~  3/12:  Now 79 y/o & she doesn't want repeat colonoscopy while on Pradaxa...  Hx of RENAL CALCULUS (ICD-592.0) - stones seen in kidney on CTAbd 2007, non-obstructing & no hx of symptomatic nephrolithiasis. ~  4/13: she had UTI w/ cult pos for Staph per Urology; treated w/ doxy & resolved; she was INTOL to cipro which made her sick she says...  DEGENERATIVE JOINT DISEASE (ICD-715.90) >>  ~  prev hx of arthroscopy w/ post-op DVT and PTE in 1995...  ~  she had a right TKR by DrWainer 4/04> apparently she had arrhythmia post op "heart stopped for 18sec & DrWainer said I can't be put to sleep no more" ~  4/13: she had left TKR DrAlusio w/ spinal anesthesia & went to [facility] for [facility] but had a rough time & she notes that they injured her rotator cuffs moving her in bed... ~  12/13: she had f/u DrWainer for Ortho- persist left shoulder pain 7 MRI revealed rotator cuff tear; given injections w/ min relief...  LOW BACK PAIN SYNDROME  (ICD-724.2) - she has had LBP evauated  by Ortho- DrWainer, DrGioffre; and by Neurosurg- DrJenkins in 2003... MRI showed a disc at L3-4 w/ mild spinal stenosis, but he did not rec surg...  ~  4/14: she is requesting Rx for Lidoderm patches to use as needed- ok...  OSTEOPENIA (ICD-733.90) - BMD followed by DrLomax, Gyn> -1.6 in [facility]... on Climara0.3 patch, Calcium, VitD. ~  Lab 4/14 showed Vit D = 22 & rec to incr to 2000u OTC supplement daily...  NECK PAIN & HEADACHE - Dx w/ occip neuralgia & cervical degen disc dis w/ prev eval DrWeymann in 2009 & another eval at [facility] 7/10- treated w/ bilat occip nerve blocks 9/10 & improved... refered to DrRauck to continue this therapy closer to home. ~  4/12:  She reports that DrRauck tried shots for her cervicogenic HAs but they didn't help, she now uses OTC analgesics as needed. ~  9/12:  HOSP by TH w/ HA-  felt to be cervicogenic & tension related; she actually complains of chr daily HAs x yrs- this was the worst one yet; eval included CTBrain w/ atrophy, sm vessel dis, & right parietal cortical infarct (no hemorrhage); CTAngio showed chr infarcts bilat, 4x55mm aneurysm in left cavernous carotid is unchanged from 4/11;  She states she was treated w/ Pred but she refused "it bloated me" (not mentioned in DCSummary); rec to take phys therapy but she declined "it didn't help in the past"; Given Robaxin & Percocet but she hasn't taken any since disch;  She has persistant cervicogenic headaches, known CSpine arthritis, & disappointed in DrRauck's prev treatment; rec referral to another pain clinic for management... ~  4/14: she denies much recent neck pain or HA- using OTC analgesics like Tylenol prn...  INTRACRANIAL ANEURYSM (ICD-437.3) - MRI/ MRA done 3/09 for HA eval revealed atophy, sm vessel dis, and an incidental 5mm aneurysm projecting posteriorly from the left ICA (cavernous segment)...  we contacted her relatives at [facility] and sent her scan and notes to Dr. Annamary Rummage at the [facility] [phone] [id number] [place] [address]... they requested that we perform a CT Angio- done 09/08/07 and showed a 5mm aneurysm projecting posteriorly from the junction of the cavernous and supraclinoid segments of the left ICA...   ~  f/u CTAngio 04/08/08 showed no significant change- she tells me that if she needs surg she wants to go to [facility] to see [facility] doctor...  ~  CDopplers 2010 showed calcif plaque in ICA's 0-39% stenoses. ~  f/u CTAngio 4/11 showed stable left cavernous ICA aneurysm measuring 4 x 5mm. ~  f/u CTAngio 9/12 showed chr infarcts bilat, 4x30mm aneurysm in left cavernous carotid is unchanged from 4/11... ~  4/14: she denies neurologic sequelae or cerebral ischemia symptoms...  ANXIETY (ICD-300.00) & INSOMNIA, CHRONIC (ICD-307.42) - she notes that Ambien 10mg  w/o help and wants something stronger- try RESTORIL 15mg  Qhs Prn... ~  4/14: not currently requiring anxiolytic rx or sleep aide...  GYN = DrLomax & seen 5/11... on CLIMARA 0.3 patch & Ca++/ VitD... he did BMD in 10/07 w/ osteopenia, & f/u 5/11 showed min deterioration w/ TScores -0.8 in spine, and -1.6 in [facility]... ~  4/14: DrLomax has retired & she needs another Gyn for her Pap, Mammograms, BMDs, & Climara refills ("I need the Climara since the generic doesn't work for me")...   Past Surgical History  Procedure Laterality Date  . Tonsillectomy and adenoidectomy    . Appendectomy    . Abdominal hysterectomy    .  Knee surgery    . Breast cyst excision    . Blocked intestine surgery    . Knee arthroscopy      X2 L KNEE  . Joint replacement  2004    RT TOTAL KNEE  . Nasal surgery x2    . Hemhorroidectomy    . Total knee arthroplasty  09/03/2011    Procedure: TOTAL KNEE ARTHROPLASTY;  Surgeon: Loanne Drilling, MD;  Location: [facility];  Service: Orthopedics;  Laterality: Left;    Outpatient Encounter Prescriptions as of 02/21/2013  Medication Sig Dispense  Refill  . diltiazem (CARDIZEM CD) 120 MG 24 hr capsule Take 1 capsule (120 mg total) by mouth daily.  90 capsule  3  . hydrochlorothiazide (HYDRODIURIL) 25 MG tablet Take 1/2 tablet by mouth once daily.  45 tablet  3  . lidocaine (LIDODERM) 5 % Place 1 patch onto the skin daily. Remove & Discard patch within 12 hours or as directed by MD  30 patch  5  . PRADAXA 150 MG CAPS TAKE 1 CAPSULE (150 MG TOTAL) BY MOUTH EVERY 12 (TWELVE) HOURS.  180 capsule  2  . simvastatin (ZOCOR) 20 MG tablet TAKE 1 TABLET (20 MG TOTAL) BY MOUTH AT BEDTIME.  90 tablet  2  . Cholecalciferol (VITAMIN D) 2000 UNITS CAPS Take 1 capsule by mouth daily.      . [DISCONTINUED] dabigatran (PRADAXA) 150 MG CAPS Take 150 mg by mouth 2 (two) times daily.      . [DISCONTINUED] gabapentin (NEURONTIN) 100 MG capsule TAKE 2 CAPSULES (200 MG TOTAL) BY MOUTH 2 (TWO) TIMES DAILY.  60 capsule  5  . [DISCONTINUED] simvastatin (ZOCOR) 20 MG tablet Take 1/2 tablet by mouth once daily      . [DISCONTINUED] Tetrahydrozoline-Zn Sulfate (EYE DROPS RELIEF OP) Apply 1 drop to eye 3 (three) times daily as needed. For dry eyes       No facility-administered encounter medications on file as of 02/21/2013.    Allergies  Allergen Reactions  . Codeine     REACTION: causes nausea  . Penicillins     REACTION: rash    Current Medications, Allergies, Past Medical History, Past Surgical History, Family History, and Social History were reviewed in [employer] record.    Review of Systems        See HPI - all other systems neg except as noted...      The patient complains of dyspnea on exertion and sl peripheral edema.  The patient denies anorexia, fever, weight loss, weight gain, vision loss, decreased hearing, hoarseness, chest pain, syncope, prolonged cough, headaches, hemoptysis, abdominal pain, melena, hematochezia, severe indigestion/heartburn, hematuria, incontinence, muscle weakness, suspicious skin lesions, transient  blindness, difficulty walking, depression, unusual weight change, abnormal bleeding, enlarged lymph nodes, and angioedema.     Objective:   Physical Exam     WD, WN, 79 y/o WF in NAD... GENERAL:  Alert & oriented; pleasant & cooperative... HEENT:  Leon/AT, EOM-wnl, PERRLA, EACs-clear, TMs-wnl, NOSE- sl red, THROAT- no exud seen... NECK:  Supple w/ decrROM; no JVD; normal carotid impulses w/o bruits; no thyromegaly or nodules palpated; no lymphadenopathy. CHEST:  Clear to P & A; without wheezes/ rales/ or rhonchi. HEART:  Regular Rhythm; msc gr 1/6 SEM without rubs or gallops heard... ABDOMEN:  Soft & nontender; normal bowel sounds; no organomegaly or masses detected. EXT: without deformities, mod arthritic changes; no varicose veins/ +venous insuffic/ tr edema. Right lower leg wrapped, support hose, less discomfort... NEURO:  CN's intact;  no focal neuro deficit... DERM:  No lesions noted; no rash etc... just chr ven insuffic changes in LE's...  RADIOLOGY DATA:  Reviewed in the EPIC EMR & discussed w/ the patient...    >>CXR 12/12 showed cardiomeg, clear lungs, NAD...    >>Sinus CT 12/12 showed bilat retention cysts along the floor of the max sinuses, mild mucosal thickening, NAD...  LABORATORY DATA:  Reviewed in the EPIC EMR & discussed w/ the patient...   Assessment & Plan:    HBP>  Controlled on the Diltiazem120 & HCT12.5 + her diet etc;  States that BPs are better at home, continue same...  PAF>  Followed by DrKlein & stable on the Pradaxa & Diltiazem for rate control...  Hx of DVT, cellulitis, VI, edema>  Off the Keflex suppressive therapy & continues to do well w/o recurrent infection, pain, etc; notes swelling L>R w/ her VI & prev vein stripping...  CHOL>  stable on Simva20-1/2Qd w/ good FLP 2/14 but she wants off Simva & rx for Atorva10  GI>  Stable on prn meds and she is not inclined to pursue another colonoscopy...  DJD, LBP, rotator cuff problems>  Followed by DrWainer  for Ortho, she requests Lidoderm patches for back- ok...  HEADACHES>  HAs improved w/ treatment via DrFreeman's HA management clinic in the past...  ANEURYSM>  Clinically stable w/o vasc or cerebral ischemic symptoms;  CTA 9/12 reviewed & stable...  ANXIETY>  She is stable w/o medication at present.Marland KitchenMarland Kitchen

## 2013-02-21 NOTE — Patient Instructions (Signed)
Today we updated your med list in our EPIC system...    Continue your current medications the same...  We decided to change the Simvastatin to Lipitor10- onr tab daily for your cholesterol...  For your bowels>>    Start the new BENTYL 10mg  twice daily for the crampng pain...\    And take ACTIVIA yogurt daily, plus a probiotic like ALIGN one daily...  For your severe pain>>    Start the Hydrocodone tabs up to 3 per day if needed...  Call for any questions...  Let's plan a follow up visit in 19mo w/ FASTING blood work at that time.Marland KitchenMarland Kitchen

## 2013-04-03 DIAGNOSIS — H1044 Vernal conjunctivitis: Secondary | ICD-10-CM | POA: Diagnosis not present

## 2013-04-16 ENCOUNTER — Telehealth: Payer: Self-pay | Admitting: Pulmonary Disease

## 2013-04-16 DIAGNOSIS — I10 Essential (primary) hypertension: Secondary | ICD-10-CM

## 2013-04-16 NOTE — Telephone Encounter (Signed)
Called spoke with patient who reported that she has had hot-flashes "all her life" and is requesting rx for this.  Pt is currently using Climara 0.5mg  patch weekly.    Please advise, thank you.  Pt stated that her GYN Dr Nicholas Lose retired approx 1 year.  She has one refill left on her prescription and is unsure how to go about filling this medication; pt stated that Dr Nicholas Lose told her that she will no longer need exams, just someone to fill her meds.    Asked patient what Dr Johnn Hai recommendations were when he retired -- pt stated that he provided a list of GYN providers that she could switch to.  She tried 1 office and they informed her that she will need to get a copy of her records from Lomax's office but when she found out she was going to have to pay for this, she did not carry through.  Pt does not understanding why the rx cannot simply be called in -- advised pt that no physician will do this without at least seeing her for a consult first with a possible exam at that time.  Dr Kriste Basque, pt does not know how to proceed past this point now as she tried calling Dr Johnn Hai office about the records since it closed and got a recording telling her not to leave any messages as no one would be checking the voice mail.  Any suggestions?  Thank you.

## 2013-04-17 DIAGNOSIS — H1044 Vernal conjunctivitis: Secondary | ICD-10-CM | POA: Diagnosis not present

## 2013-04-18 NOTE — Telephone Encounter (Signed)
Called and spoke with pt and she stated that she took her paper work back to the GYN office next door today.  She is unsure of when they will be able to set up her appt.  Pt was advised that we could set her up with a primary care doctor that will treat her for the GYN problems and primary care.  Pt stated that she would rather see a female doctor.  She would like to have a doctor on her side of town near [address].  Pt is aware that i will speak with SN and see who he recs for the pt and we will set up a referral to transfer her care.

## 2013-04-25 NOTE — Telephone Encounter (Signed)
Pt is aware that we will set her up with primary care doctor that is near her house---that will also follow her for GYN problems.

## 2013-05-08 ENCOUNTER — Other Ambulatory Visit: Payer: Self-pay | Admitting: Internal Medicine

## 2013-05-21 DIAGNOSIS — Z1211 Encounter for screening for malignant neoplasm of colon: Secondary | ICD-10-CM | POA: Diagnosis not present

## 2013-05-21 DIAGNOSIS — N959 Unspecified menopausal and perimenopausal disorder: Secondary | ICD-10-CM | POA: Diagnosis not present

## 2013-05-21 DIAGNOSIS — Z0000 Encounter for general adult medical examination without abnormal findings: Secondary | ICD-10-CM | POA: Diagnosis not present

## 2013-05-21 DIAGNOSIS — Z01419 Encounter for gynecological examination (general) (routine) without abnormal findings: Secondary | ICD-10-CM | POA: Diagnosis not present

## 2013-06-03 ENCOUNTER — Other Ambulatory Visit: Payer: Self-pay | Admitting: Internal Medicine

## 2013-06-17 ENCOUNTER — Other Ambulatory Visit: Payer: Self-pay | Admitting: Internal Medicine

## 2013-06-20 DIAGNOSIS — H40059 Ocular hypertension, unspecified eye: Secondary | ICD-10-CM | POA: Diagnosis not present

## 2013-06-25 ENCOUNTER — Emergency Department (INDEPENDENT_AMBULATORY_CARE_PROVIDER_SITE_OTHER)
Admission: EM | Admit: 2013-06-25 | Discharge: 2013-06-25 | Disposition: A | Discharge status: Home / Self Care | Payer: Medicare Other | Source: Home / Self Care | Attending: Emergency Medicine | Admitting: Emergency Medicine

## 2013-06-25 ENCOUNTER — Ambulatory Visit: Payer: Self-pay | Admitting: Internal Medicine

## 2013-06-25 ENCOUNTER — Telehealth: Payer: Self-pay | Admitting: Family Medicine

## 2013-06-25 ENCOUNTER — Encounter (HOSPITAL_COMMUNITY): Payer: Self-pay | Admitting: Emergency Medicine

## 2013-06-25 DIAGNOSIS — A084 Viral intestinal infection, unspecified: Secondary | ICD-10-CM

## 2013-06-25 DIAGNOSIS — A088 Other specified intestinal infections: Secondary | ICD-10-CM | POA: Diagnosis not present

## 2013-06-25 LAB — CBC WITH DIFFERENTIAL/PLATELET
Basophils Absolute: 0 10*3/uL (ref 0.0–0.1)
Basophils Relative: 1 % (ref 0–1)
Eosinophils Absolute: 0.1 10*3/uL (ref 0.0–0.7)
Eosinophils Relative: 1 % (ref 0–5)
HCT: 40.9 % (ref 36.0–46.0)
Hemoglobin: 13.9 g/dL (ref 12.0–15.0)
Lymphocytes Relative: 41 % (ref 12–46)
Lymphs Abs: 2.4 10*3/uL (ref 0.7–4.0)
MCH: 30.7 pg (ref 26.0–34.0)
MCHC: 34 g/dL (ref 30.0–36.0)
MCV: 90.3 fL (ref 78.0–100.0)
Monocytes Absolute: 0.7 10*3/uL (ref 0.1–1.0)
Monocytes Relative: 12 % (ref 3–12)
Neutro Abs: 2.6 10*3/uL (ref 1.7–7.7)
Neutrophils Relative %: 45 % (ref 43–77)
Platelets: 232 10*3/uL (ref 150–400)
RBC: 4.53 MIL/uL (ref 3.87–5.11)
RDW: 13.6 % (ref 11.5–15.5)
WBC: 5.9 10*3/uL (ref 4.0–10.5)

## 2013-06-25 LAB — POCT I-STAT, CHEM 8
BUN: 7 mg/dL (ref 6–23)
Calcium, Ion: 1.29 mmol/L (ref 1.13–1.30)
Chloride: 102 mEq/L (ref 96–112)
Creatinine, Ser: 0.7 mg/dL (ref 0.50–1.10)
Glucose, Bld: 97 mg/dL (ref 70–99)
HCT: 46 % (ref 36.0–46.0)
Hemoglobin: 15.6 g/dL — ABNORMAL HIGH (ref 12.0–15.0)
Potassium: 3.8 mEq/L (ref 3.7–5.3)
Sodium: 142 mEq/L (ref 137–147)
TCO2: 26 mmol/L (ref 0–100)

## 2013-06-25 MED ORDER — HYDROCORTISONE 2.5 % RE CREA: 1.0000 "application " | TOPICAL_CREAM | Freq: Two times a day (BID) | RECTAL | Status: DC

## 2013-06-25 MED ORDER — DIPHENOXYLATE-ATROPINE 2.5-0.025 MG PO TABS: 1.0000 | ORAL_TABLET | Freq: Four times a day (QID) | ORAL | Status: DC | PRN

## 2013-06-25 NOTE — ED Notes (Signed)
C/o diarrhea since Friday.  States with eating or drinking immediately has to go.  Hx of diverticulosis.  Denies pain, n/v, and fever.  No relief with otc meds.

## 2013-06-25 NOTE — ED Provider Notes (Signed)
Chief Complaint   Chief Complaint  Patient presents with  . Diarrhea     History of Present Illness   Samantha Bishop is an 80 year old female who has had a four-day history of loose stools. This occurs mostly after eating. There's been no blood in the stool, however her rectum is very irritated. She denies fever, chills, weakness, dizziness, nausea, vomiting, or any abdominal pain. She has had no suspicious ingestions, foreign travel, or sick contacts.  Review of Systems   Other than as noted above, the patient denies any of the following symptoms: Systemic:  No fevers, chills, sweats, weight loss or gain, fatigue, or tiredness. ENT:  No nasal congestion, rhinorrhea, or sore throat. Lungs:  No cough, wheezing, or shortness of breath. Cardiac:  No chest pain, syncope, or presyncope. GI:  No abdominal pain, nausea, vomiting, anorexia, diarrhea, constipation, blood in stool or vomitus. GU:  No dysuria, frequency, or urgency.  [place]   Past medical history, family history, social history, meds, and allergies were reviewed.  She is allergic to penicillins. She takes H. atorvastatin, HCTZ, Pradaxa, gabapentin, and diltiazem. Medical history includes hypertension, atrial fibrillation, pulmonary emboli, hyperlipidemia, diverticulosis, hemorrhoids, kidney stone, and cervical neuralgia.  Physical Exam     Vital signs:  BP 177/120  Pulse 97  Temp(Src) 98.1 F (36.7 C) (Oral)  Resp 18  SpO2 98% General:  Alert and oriented.  In no distress.  Skin warm and dry.  Good skin turgor, brisk capillary refill. ENT:  No scleral icterus, moist mucous membranes, no oral lesions, pharynx clear. Lungs:  Breath sounds clear and equal bilaterally.  No wheezes, rales, or rhonchi. Heart:  Rhythm regular, without extrasystoles.  No gallops or murmers. Abdomen:  Soft, flat, nondistended. No organomegaly or mass. Bowel sounds are hyperactive. No tenderness to palpation, guarding or rebound. Skin: Clear,  warm, and dry.  Good turgor.  Brisk capillary refill.  Labs   Results for orders placed during the hospital encounter of 06/25/13  CBC WITH DIFFERENTIAL      Result Value Ref Range   WBC 5.9  4.0 - 10.5 K/uL   RBC 4.53  3.87 - 5.11 MIL/uL   Hemoglobin 13.9  12.0 - 15.0 g/dL   HCT 40.9  36.0 - 46.0 %   MCV 90.3  78.0 - 100.0 fL   MCH 30.7  26.0 - 34.0 pg   MCHC 34.0  30.0 - 36.0 g/dL   RDW 13.6  11.5 - 15.5 %   Platelets 232  150 - 400 K/uL   Neutrophils Relative % 45  43 - 77 %   Neutro Abs 2.6  1.7 - 7.7 K/uL   Lymphocytes Relative 41  12 - 46 %   Lymphs Abs 2.4  0.7 - 4.0 K/uL   Monocytes Relative 12  3 - 12 %   Monocytes Absolute 0.7  0.1 - 1.0 K/uL   Eosinophils Relative 1  0 - 5 %   Eosinophils Absolute 0.1  0.0 - 0.7 K/uL   Basophils Relative 1  0 - 1 %   Basophils Absolute 0.0  0.0 - 0.1 K/uL  POCT I-STAT, CHEM 8      Result Value Ref Range   Sodium 142  137 - 147 mEq/L   Potassium 3.8  3.7 - 5.3 mEq/L   Chloride 102  96 - 112 mEq/L   BUN 7  6 - 23 mg/dL   Creatinine, Ser 0.70  0.50 - 1.10 mg/dL   Glucose, Bld  97  70 - 99 mg/dL   Calcium, Ion 1.29  1.13 - 1.30 mmol/L   TCO2 26  0 - 100 mmol/L   Hemoglobin 15.6 (*) 12.0 - 15.0 g/dL   HCT 46.0  36.0 - 46.0 %    A stool culture was obtained.  Assessment   The encounter diagnosis was Viral gastroenteritis.  Differential diagnosis is bacterial versus viral gastroenteritis. A stool culture has been obtained, but I'm going to hold off on treatment since she's only been sick for 4 days. We will call her back if her stool culture comes back positive.  Plan   1.  Meds:  The following meds were prescribed:   New Prescriptions   DIPHENOXYLATE-ATROPINE (LOMOTIL) 2.5-0.025 MG PER TABLET    Take 1 tablet by mouth 4 (four) times daily as needed for diarrhea or loose stools.   HYDROCORTISONE (PROCTOSOL HC) 2.5 % RECTAL CREAM    Place 1 application rectally 2 (two) times daily.    2.  Patient Education/Counseling:  The  patient was given appropriate handouts, self care instructions, and instructed in symptomatic relief. The patient was told to stay on clear liquids for the remainder of the day, then advance to a B.R.A.T. diet starting tomorrow.   3.  Follow up:  The patient was told to follow up here if no better in 2 to 3 days, or sooner if becoming worse in any way, and given some red flag symptoms such as persistent vomitng, high fever, severe abdominal pain, or any GI bleeding which would prompt immediate return.         Harden Mo, MD 06/25/13 [phone]

## 2013-06-25 NOTE — Discharge Instructions (Signed)
Diet for Diarrhea, Adult  Frequent, runny stools (diarrhea) may be caused or worsened by food or drink. Diarrhea may be relieved by changing your diet. Since diarrhea can last up to 7 days, it is easy for you to lose too much fluid from the body and become dehydrated. Fluids that are lost need to be replaced. Along with a modified diet, make sure you drink enough fluids to keep your urine clear or pale yellow.  DIET INSTRUCTIONS  · Ensure adequate fluid intake (hydration): have 1 cup (8 oz) of fluid for each diarrhea episode. Avoid fluids that contain simple sugars or sports drinks, fruit juices, whole milk products, and sodas. Your urine should be clear or pale yellow if you are drinking enough fluids. Hydrate with an oral rehydration solution that you can purchase at pharmacies, retail stores, and online. You can prepare an oral rehydration solution at home by mixing the following ingredients together:  ·   tsp table salt.  · ¾ tsp baking soda.  ·  tsp salt substitute containing potassium chloride.  · 1  tablespoons sugar.  · 1 L (34 oz) of water.  · Certain foods and beverages may increase the speed at which food moves through the gastrointestinal (GI) tract. These foods and beverages should be avoided and include:  · Caffeinated and alcoholic beverages.  · High-fiber foods, such as raw fruits and vegetables, nuts, seeds, and whole grain breads and cereals.  · Foods and beverages sweetened with sugar alcohols, such as xylitol, sorbitol, and mannitol.  · Some foods may be well tolerated and may help thicken stool including:  · Starchy foods, such as rice, toast, pasta, low-sugar cereal, oatmeal, grits, baked potatoes, crackers, and bagels.    · Bananas.    · Applesauce.  · Add probiotic-rich foods to help increase healthy bacteria in the GI tract, such as yogurt and fermented milk products.  RECOMMENDED FOODS AND BEVERAGES  Starches  Choose foods with less than 2 g of fiber per serving.  · Recommended:  White,  French, and pita breads, plain rolls, buns, bagels. Plain muffins, matzo. Soda, saltine, or graham crackers. Pretzels, melba toast, zwieback. Cooked cereals made with water: cornmeal, farina, cream cereals. Dry cereals: refined corn, wheat, rice. Potatoes prepared any way without skins, refined macaroni, spaghetti, noodles, refined rice.  · Avoid:  Bread, rolls, or crackers made with whole wheat, multi-grains, rye, bran seeds, nuts, or coconut. Corn tortillas or taco shells. Cereals containing whole grains, multi-grains, bran, coconut, nuts, raisins. Cooked or dry oatmeal. Coarse wheat cereals, granola. Cereals advertised as "high-fiber." Potato skins. Whole grain pasta, wild or brown rice. Popcorn. Sweet potatoes, yams. Sweet rolls, doughnuts, waffles, pancakes, sweet breads.  Vegetables  · Recommended: Strained tomato and vegetable juices. Most well-cooked and canned vegetables without seeds. Fresh: Tender lettuce, cucumber without the skin, cabbage, spinach, bean sprouts.  · Avoid: Fresh, cooked, or canned: Artichokes, baked beans, beet greens, broccoli, Brussels sprouts, corn, kale, legumes, peas, sweet potatoes. Cooked: Green or red cabbage, spinach. Avoid large servings of any vegetables because vegetables shrink when cooked, and they contain more fiber per serving than fresh vegetables.  Fruit  · Recommended: Cooked or canned: Apricots, applesauce, cantaloupe, cherries, fruit cocktail, grapefruit, grapes, kiwi, mandarin oranges, peaches, pears, plums, watermelon. Fresh: Apples without skin, ripe banana, grapes, cantaloupe, cherries, grapefruit, peaches, oranges, plums. Keep servings limited to ½ cup or 1 piece.  · Avoid: Fresh: Apples with skin, apricots, mangoes, pears, raspberries, strawberries. Prune juice, stewed or dried prunes. Dried   fruits, raisins, dates. Large servings of all fresh fruits.  Protein  · Recommended: Ground or well-cooked tender beef, ham, veal, lamb, pork, or poultry. Eggs. Fish,  oysters, shrimp, lobster, other seafoods. Liver, organ meats.  · Avoid: Tough, fibrous meats with gristle. Peanut butter, smooth or chunky. Cheese, nuts, seeds, legumes, dried peas, beans, lentils.  Dairy  · Recommended: Yogurt, lactose-free milk, kefir, drinkable yogurt, buttermilk, soy milk, or plain hard cheese.  · Avoid: Milk, chocolate milk, beverages made with milk, such as milkshakes.  Soups  · Recommended: Bouillon, broth, or soups made from allowed foods. Any strained soup.  · Avoid: Soups made from vegetables that are not allowed, cream or milk-based soups.  Desserts and Sweets  · Recommended: Sugar-free gelatin, sugar-free frozen ice pops made without sugar alcohol.  · Avoid: Plain cakes and cookies, pie made with fruit, pudding, custard, cream pie. Gelatin, fruit, ice, sherbet, frozen ice pops. Ice cream, ice milk without nuts. Plain hard candy, honey, jelly, molasses, syrup, sugar, chocolate syrup, gumdrops, marshmallows.  Fats and Oils  · Recommended: Limit fats to less than 8 tsp per day.  · Avoid: Seeds, nuts, olives, avocados. Margarine, butter, cream, mayonnaise, salad oils, plain salad dressings. Plain gravy, crisp bacon without rind.  Beverages  · Recommended: Water, decaffeinated teas, oral rehydration solutions, sugar-free beverages not sweetened with sugar alcohols.  · Avoid: Fruit juices, caffeinated beverages (coffee, tea, soda), alcohol, sports drinks, or lemon-lime soda.  Condiments  · Recommended: Ketchup, mustard, horseradish, vinegar, cocoa powder. Spices in moderation: allspice, basil, bay leaves, celery powder or leaves, cinnamon, cumin powder, curry powder, ginger, mace, marjoram, onion or garlic powder, oregano, paprika, parsley flakes, ground pepper, rosemary, sage, savory, tarragon, thyme, turmeric.  · Avoid: Coconut, honey.  Document Released: 07/17/2003 Document Revised: 01/19/2012 Document Reviewed: 09/10/2011  ExitCare® Patient Information ©2014 [employer].

## 2013-06-25 NOTE — Telephone Encounter (Signed)
Noted  

## 2013-06-25 NOTE — Telephone Encounter (Signed)
Patient Information:  Caller Name: Makana  Phone: [phone]  Patient: Samantha Bishop, Samantha Bishop  Gender: Female  DOB: August 02, 1924  Age: 80 Years  PCP: Maudie Mercury (TEXT 1st, after 20 mins can call), [facility])  Office Follow Up:  Does the office need to follow up with this patient?: No  Instructions For The Office: N/A  RN Note:  Onset of loose stools 06/22/13.  Continues to have BMs > 6 per day.  States she is incontinent of stool due to the urgency of this.  Afebrile.  Denies blood in stool, vomiting, or abdominal pain.  Emergent symptoms denied; advised being seen within 4 hours.  Advised UC, as patient is priimary care patient of Dr. Nadel/Pulmonary.  Krs/can   Symptoms  Reason For Call & Symptoms: diarrhea  Reviewed Health History In EMR: Yes  Reviewed Medications In EMR: Yes  Reviewed Allergies In EMR: Yes  Reviewed Surgeries / Procedures: Yes  Date of Onset of Symptoms: 06/22/2013  Guideline(s) Used:  Diarrhea  Disposition Per Guideline:   Go to Office Now  Reason For Disposition Reached:   Age > 60 years and has had > 6 diarrhea stools in past 24 hours  Advice Given:  N/A  Patient advised to go to UC, as she has new patient appt scheduled 07/11/13 with Dr. Maudie Mercury.  Patient agrees.  Juanita Laster

## 2013-06-29 ENCOUNTER — Other Ambulatory Visit: Payer: Self-pay | Admitting: Pulmonary Disease

## 2013-07-11 ENCOUNTER — Ambulatory Visit (INDEPENDENT_AMBULATORY_CARE_PROVIDER_SITE_OTHER): Payer: Medicare Other | Admitting: Family Medicine

## 2013-07-11 ENCOUNTER — Telehealth: Payer: Self-pay | Admitting: Family Medicine

## 2013-07-11 ENCOUNTER — Encounter: Payer: Self-pay | Admitting: Family Medicine

## 2013-07-11 VITALS — BP 132/80 | Temp 97.9°F | Ht 65.0 in | Wt 181.0 lb

## 2013-07-11 DIAGNOSIS — E785 Hyperlipidemia, unspecified: Secondary | ICD-10-CM | POA: Diagnosis not present

## 2013-07-11 DIAGNOSIS — I4891 Unspecified atrial fibrillation: Secondary | ICD-10-CM

## 2013-07-11 DIAGNOSIS — K219 Gastro-esophageal reflux disease without esophagitis: Secondary | ICD-10-CM | POA: Diagnosis not present

## 2013-07-11 DIAGNOSIS — I671 Cerebral aneurysm, nonruptured: Secondary | ICD-10-CM

## 2013-07-11 DIAGNOSIS — I872 Venous insufficiency (chronic) (peripheral): Secondary | ICD-10-CM | POA: Diagnosis not present

## 2013-07-11 DIAGNOSIS — M5481 Occipital neuralgia: Secondary | ICD-10-CM

## 2013-07-11 DIAGNOSIS — I10 Essential (primary) hypertension: Secondary | ICD-10-CM

## 2013-07-11 DIAGNOSIS — M531 Cervicobrachial syndrome: Secondary | ICD-10-CM

## 2013-07-11 LAB — LIPID PANEL
Cholesterol: 134 mg/dL (ref 0–200)
HDL: 54.8 mg/dL (ref >39.00)
LDL Cholesterol: 58 mg/dL (ref 0–99)
Total CHOL/HDL Ratio: 2
Triglycerides: 108 mg/dL (ref 0.0–149.0)
VLDL: 21.6 mg/dL (ref 0.0–40.0)

## 2013-07-11 NOTE — Patient Instructions (Addendum)
-  schedule appointment with your cardiologist and ask about the cholesterol medication  -We placed a referral for you as discussed  To the neurologist. It usually takes about 1-2 weeks to process and schedule this referral. If you have not heard from [place] regarding this appointment in 2 weeks please contact our office.  -We have ordered labs or studies at this visit. It can take up to 1-2 weeks for results and processing. We will contact you with instructions IF your results are abnormal. Normal results will be released to your [facility]. If you have not heard from [place] or can not find your results in [facility] in 2 weeks please contact our office.  -follow up in 3-4 months or as needed and yearly for medicare exam

## 2013-07-11 NOTE — Progress Notes (Signed)
Chief Complaint  Patient presents with  . Establish Care    HPI:  Samantha Bishop is here to establish care. Dr. Lenna Gilford is retiring so she is coming here. She has a complicated medical history. Last PCP and physical:     Has the following chronic problems and concerns today:  Patient Active Problem List   Diagnosis Date Noted  . Venous insufficiency 07/11/2013  . Alopecia 06/08/2012  . OSTEOPENIA 02/21/2010  . ANXIETY 05/21/2009  . Hypertension 05/21/2009  . Atrial fibrillation - followed by Dr. Caryl Comes 03/03/2009  . INTRACRANIAL ANEURYSM 08/16/2007  . HYPERLIPIDEMIA 08/15/2007  . GERD 08/15/2007    Atrial fib/ hx SVT s/p ablation/hx DVT/HLD/HTN: -followed by Dr. 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  Past Medical History  Diagnosis Date  . HTN (hypertension)   . Atrial fibrillation     managed with rate control and anticoagulation.   . Tachycardia, unspecified   . Pulmonary emboli     following remote knee arthroscopy surgery  . Hyperlipidemia   . Diverticulosis of colon (without mention of hemorrhage)   . Benign neoplasm of colon   . Unspecified hemorrhoids without mention of complication   . Calculus of kidney   . Osteoarthrosis,  unspecified whether generalized or localized, unspecified site   . Lumbago   . Cerebral aneurysm, nonruptured   . Anxiety state, unspecified   . Persistent disorder of initiating or maintaining sleep   . Chronic anticoagulation     on Pradaxa  . Atrial tachycardia     in the remote past with remote ablation  . UTI (urinary tract infection)     CURRENTLY BEING TX FOR UTI  . Esophageal reflux     YRS AGO - NO PROBLEM NOW  . History of skin cancer   . Ruptured lumbar disc   . Complication of anesthesia     "my heart stopped" 2004 knee  surg - see note on chart  . Sleep apnea     stop bang score 4   . DEEP VENOUS THROMBOPHLEBITIS 08/15/2007    Qualifier: History of  By: Lenna Gilford MD, [place] WITHOUT COMPS 09/04/2009    Qualifier: Diagnosis of  By: Ola Spurr MD, [name]    . OA (osteoarthritis) of knee 09/03/2011  . OSTEOPENIA 02/21/2010    Qualifier: Diagnosis of  By: Lenna Gilford MD, Deborra Medina   . NECK PAIN 08/07/2007    Family History  Problem Relation Age of Onset  . Coronary artery disease Father   . Cirrhosis Brother   . Stroke Father     History   Social History  . Marital Status: Single    Spouse Name: N/A    Number of Children:  3  . Years of Education: N/A   Occupational History  . Retired    Social History Main Topics  . Smoking status: Never Smoker   . Smokeless tobacco: Never Used  . Alcohol Use: No  . Drug Use: No  . Sexual Activity: Not Currently   Other Topics Concern  . None   Social History Narrative  . None    Current outpatient prescriptions:Cholecalciferol (VITAMIN D) 2000 UNITS CAPS, Take 1 capsule by mouth daily., Disp: , Rfl: ;  CLIMARA 0.05 MG/24HR patch, Place 1 patch onto the skin once a week., Disp: , Rfl: ;  diltiazem (CARDIZEM CD) 120 MG 24 hr capsule, TAKE 1 CAPSULE (120 MG TOTAL) BY MOUTH DAILY., Disp: 90 capsule, Rfl: 0 gabapentin (NEURONTIN) 100 MG capsule, TAKE 2 CAPSULES (200 MG TOTAL) BY MOUTH 2 (TWO) TIMES  DAILY., Disp: 60 capsule, Rfl: 0;  hydrochlorothiazide (HYDRODIURIL) 25 MG tablet, TAKE 1/2 TABLET BY MOUTH ONCE DAILY., Disp: 45 tablet, Rfl: 0;  lidocaine (LIDODERM) 5 %, Place 1 patch onto the skin daily. Remove & Discard patch within 12 hours or as directed by MD, Disp: 30 patch, Rfl: 5 PRADAXA 150 MG CAPS capsule, TAKE ONE CAPSULE BY MOUTH EVERY 12 HOURS, Disp: 180 capsule, Rfl: 0;  atorvastatin (LIPITOR) 10 MG tablet, Take 1 tablet (10 mg total) by mouth daily., Disp: 30 tablet, Rfl: 11;  PREMARIN vaginal cream, , Disp: , Rfl:   EXAM:  Filed Vitals:   07/11/13 1122  BP: 132/80  Temp: 97.9 F (36.6 C)    Body mass index is 30.12 kg/(m^2).  GENERAL: vitals reviewed and listed above, alert, oriented, appears well hydrated and in no acute distress  HEENT: atraumatic, conjunttiva clear, no obvious abnormalities on inspection of external nose and ears  NECK: no obvious masses on inspection  LUNGS: clear to auscultation bilaterally, no wheezes, rales or rhonchi, good air movement  CV: HRRR, no peripheral edema  MS: moves all extremities without noticeable abnormality  PSYCH: pleasant and cooperative, no obvious depression or anxiety  ASSESSMENT AND PLAN:  Discussed the following assessment and plan:  Venous insufficiency  Hypertension  GERD  INTRACRANIAL ANEURYSM - Plan: Ambulatory referral to Neurology  Occipital neuralgia  HYPERLIPIDEMIA - Plan: Lipid Panel  Atrial fibrillation - followed by Dr. Caryl Comes  -We reviewed the PMH, PSH, FH, SH, Meds and Allergies. -We provided refills for any medications we will prescribe as needed. -We addressed current concerns per orders and patient instructions. -We have asked for records for pertinent exams, studies, vaccines and notes from previous providers. -We have advised patient to follow up per instructions below. -Had BMP and CBC recently, needs lipid panel -referred to neurology for assessment and management/recommendations  regarding the aneurysm -discussed options for HLD - every other day dosing, trial of different statin, she will discuss with her cardiologist - Fasting lipids today -follow up in 3-4 months and yearly for medicare exam and as needed ->45 minutes spent face to face counseling this patient -Patient advised to return or notify a doctor immediately if symptoms worsen or persist or new concerns arise.  Patient Instructions  -schedule appointment with your cardiologist and ask about the cholesterol medication  -We placed a referral for you as discussed  To the neurologist. It usually takes about 1-2 weeks to process and schedule this referral. If you have not heard from [place] regarding this appointment in 2 weeks please contact our office.  -We have ordered labs or studies at this visit. It can  take up to 1-2 weeks for results and processing. We will contact you with instructions IF your results are abnormal. Normal results will be released to your [facility]. If you have not heard from [place] or can not find your results in [facility] in 2 weeks please contact our office.  -follow up in 3-4 months or as needed           Izayah Miner R.

## 2013-07-11 NOTE — Telephone Encounter (Signed)
Relevant patient education mailed to patient.  

## 2013-07-11 NOTE — Progress Notes (Signed)
Pre visit review using our clinic review tool, if applicable. No additional management support is needed unless otherwise documented below in the visit note. 

## 2013-07-12 NOTE — Telephone Encounter (Signed)
Message copied by Lucretia Kern on Thu Jul 12, 2013  1:34 PM ------      Message from: Colleen Can      Created: Thu Jul 12, 2013 12:58 PM       Called and spoke with pt and pt is aware.  Pt would like a call back about neurology referral. I advised pt that she was being sent there to have her medications managed but pt states she was confused about it when she received the call. ------

## 2013-07-12 NOTE — Telephone Encounter (Signed)
The neurology referral is for follow up on her aneurysm - we discussed this at length at her appointment and that was what she wanted to do because she was interested in re-evaluating it.

## 2013-07-13 NOTE — Telephone Encounter (Signed)
Called and spoke with pt and pt is aware.  Pt verbalized understanding.  

## 2013-07-14 ENCOUNTER — Other Ambulatory Visit: Payer: Self-pay | Admitting: Pulmonary Disease

## 2013-07-16 ENCOUNTER — Encounter: Payer: Self-pay | Admitting: Neurology

## 2013-07-16 ENCOUNTER — Ambulatory Visit (INDEPENDENT_AMBULATORY_CARE_PROVIDER_SITE_OTHER): Payer: Medicare Other | Admitting: Neurology

## 2013-07-16 ENCOUNTER — Other Ambulatory Visit: Payer: Self-pay | Admitting: Neurology

## 2013-07-16 VITALS — BP 134/74 | HR 72 | Resp 16 | Ht 66.5 in | Wt 184.0 lb

## 2013-07-16 DIAGNOSIS — M531 Cervicobrachial syndrome: Secondary | ICD-10-CM | POA: Diagnosis not present

## 2013-07-16 DIAGNOSIS — I671 Cerebral aneurysm, nonruptured: Secondary | ICD-10-CM | POA: Diagnosis not present

## 2013-07-16 DIAGNOSIS — M5481 Occipital neuralgia: Secondary | ICD-10-CM

## 2013-07-16 LAB — BUN: BUN: 11 mg/dL (ref 6–23)

## 2013-07-16 MED ORDER — GABAPENTIN 100 MG PO CAPS: ORAL_CAPSULE | ORAL | Status: DC

## 2013-07-16 NOTE — Progress Notes (Signed)
NEUROLOGY CONSULTATION NOTE  Samantha Bishop MRN: [id number] DOB: 04-16-1925  Referring provider: Dr. Colin Benton Primary care provider: Dr. Colin Benton  Reason for consult:  Aneurysm followup  Dear Dr Maudie Mercury:  Thank you for your kind referral of Samantha Bishop for consultation of the above symptoms. Although her history is well known to you, please allow me to reiterate it for the purpose of our medical record.   HISTORY OF PRESENT ILLNESS: This is a very pleasant 80 year old right-handed woman with a history of hypertension, atrial fibrillation on anticoagulation, and small left cavernous carotid aneurysm and fusiform dilatation of the right A3 segment.  She presents for follow-up of the aneurysm, which her prior PCP had been doing interval scans, last imaging in 2012.  Her 2 nephews (brother's sons) have a history of cerebral aneurysms.  Samantha Bishop denies any headaches, dizziness, diplopia, dysarthria, dysphagia, neck/back pain, focal numbness/tingling/weakness, bowel or bladder dysfunction.  She was having significant excruciating pains in the occipital region and had been to [place] to see a headache specialist, where she was diagnosed with occipital neuralgia.  Occipital nerve blocks only helped for 2 weeks.  She was then started on gabapentin 3-4 months ago, reporting resolution of pain.  She reports 0/10 pain at this time, but does feel that if she moves her head she has to be careful.  She has a history of right leg fracture and reports she cannot stand on it for long periods of time.  She reports seeing a neurosurgeon in 2009 who told her about surgery for the aneurysms, and "I never returned."  Records and images were personally reviewed where available. Last CTA head in 2012 showed hypodensities in the bilateral white matter, with chronic infarct in the right parietal cortex. Both vertebral arteries are patent to the basilar. Mild  atherosclerotic calcified plaque of the distal  right vertebral artery. PICA is patent bilaterally. The superior cerebellar and posterior cerebral arteries are patent bilaterally. Fetal origin of the right posterior cerebral artery. Atherosclerotic calcification is present in the cavernous carotid bilaterally. 4 x 5 mm aneurysm in the left cavernous carotid is unchanged. This projects inferiorly from the supraclinoid segment of the internal carotid artery on the left. No significant carotid stenosis.  Anterior and middle cerebral arteries are patent bilaterally without significant stenosis. Mild fusiform dilatation of the right A3 segment is again noted and unchanged. This is near a branch point. This could be a small aneurysm or atherosclerotic fusiform dilatation. No significant stenosis in the area. No other aneurysms.  Laboratory Data: Lab Results  Component Value Date   WBC 5.9 06/25/2013   HGB 15.6* 06/25/2013   HCT 46.0 06/25/2013   MCV 90.3 06/25/2013   PLT 232 06/25/2013     Chemistry      Component Value Date/Time   NA 142 06/25/2013 1627   K 3.8 06/25/2013 1627   CL 102 06/25/2013 1627   CO2 28 08/22/2012 1239   BUN 7 06/25/2013 1627   CREATININE 0.70 06/25/2013 1627      Component Value Date/Time   CALCIUM 9.3 08/22/2012 1239   ALKPHOS 42 08/22/2012 1239   AST 20 08/22/2012 1239   ALT 19 08/22/2012 1239   BILITOT 0.9 08/22/2012 1239       PAST MEDICAL HISTORY: Past Medical History  Diagnosis Date  . HTN (hypertension)   . Atrial fibrillation     managed with rate control and anticoagulation.   . Tachycardia, unspecified   .  Pulmonary emboli     following remote knee arthroscopy surgery  . Hyperlipidemia   . Diverticulosis of colon (without mention of hemorrhage)   . Benign neoplasm of colon   . Unspecified hemorrhoids without mention of complication   . Calculus of kidney   . Osteoarthrosis, unspecified whether generalized or localized, unspecified site   . Lumbago   . Cerebral aneurysm, nonruptured   . Anxiety state,  unspecified   . Persistent disorder of initiating or maintaining sleep   . Chronic anticoagulation     on Pradaxa  . Atrial tachycardia     in the remote past with remote ablation  . UTI (urinary tract infection)     CURRENTLY BEING TX FOR UTI  . Esophageal reflux     YRS AGO - NO PROBLEM NOW  . History of skin cancer   . Ruptured lumbar disc   . Complication of anesthesia     "my heart stopped" 2004 knee  surg - see note on chart  . Sleep apnea     stop bang score 4   . DEEP VENOUS THROMBOPHLEBITIS 08/15/2007    Qualifier: History of  By: Lenna Gilford MD, [place] WITHOUT COMPS 09/04/2009    Qualifier: Diagnosis of  By: Ola Spurr MD, [name]    . OA (osteoarthritis) of knee 09/03/2011  . OSTEOPENIA 02/21/2010    Qualifier: Diagnosis of  By: Lenna Gilford MD, Deborra Medina   . NECK PAIN 08/07/2007    PAST SURGICAL HISTORY: Past Surgical History  Procedure Laterality Date  . Tonsillectomy and adenoidectomy    . Appendectomy    . Abdominal hysterectomy    . Knee surgery    . Breast cyst excision    . Blocked intestine surgery    . Knee arthroscopy      X2 L KNEE  . Joint replacement  2004    RT TOTAL KNEE  . Nasal surgery x2    . Hemhorroidectomy    . Total knee arthroplasty  09/03/2011    Procedure: TOTAL KNEE ARTHROPLASTY;  Surgeon: Gearlean Alf, MD;  Location: [facility];  Service: Orthopedics;  Laterality: Left;    MEDICATIONS: Current Outpatient Prescriptions on File Prior to Visit  Medication Sig Dispense Refill  . atorvastatin (LIPITOR) 10 MG tablet Take 1 tablet (10 mg total) by mouth daily.  30 tablet  11  . CLIMARA 0.05 MG/24HR patch Place 1 patch onto the skin once a week.      . diltiazem (CARDIZEM CD) 120 MG 24 hr capsule TAKE 1 CAPSULE (120 MG TOTAL) BY MOUTH DAILY.  90 capsule  0  . hydrochlorothiazide (HYDRODIURIL) 25 MG tablet TAKE 1/2 TABLET BY MOUTH ONCE DAILY.  45 tablet  0  . lidocaine (LIDODERM) 5 % Place 1 patch onto the skin daily. Remove &  Discard patch within 12 hours or as directed by MD  30 patch  5  . PRADAXA 150 MG CAPS capsule TAKE ONE CAPSULE BY MOUTH EVERY 12 HOURS  180 capsule  0  . PREMARIN vaginal cream        No current facility-administered medications on file prior to visit.    ALLERGIES: Allergies  Allergen Reactions  . Codeine     REACTION: causes nausea  . Penicillins     REACTION: rash    FAMILY HISTORY: Family History  Problem Relation Age of Onset  . Coronary artery disease Father   . Cirrhosis Brother   . Stroke Father  SOCIAL HISTORY: History   Social History  . Marital Status: Single    Spouse Name: N/A    Number of Children: 3  . Years of Education: N/A   Occupational History  . Retired    Social History Main Topics  . Smoking status: Never Smoker   . Smokeless tobacco: Never Used  . Alcohol Use: No  . Drug Use: No  . Sexual Activity: Not Currently   Other Topics Concern  . Not on file   Social History Narrative  . No narrative on file    REVIEW OF SYSTEMS: Constitutional: No fevers, chills, or sweats, no generalized fatigue, change in appetite Eyes: No visual changes, double vision, eye pain Ear, nose and throat: No hearing loss, ear pain, nasal congestion, sore throat Cardiovascular: No chest pain, palpitations Respiratory:  No shortness of breath at rest or with exertion, wheezes GastrointestinaI: No nausea, vomiting, diarrhea, abdominal pain, fecal incontinence Genitourinary:  No dysuria, urinary retention or frequency Musculoskeletal:  No neck pain, back pain. + bilateral knee pain, right>left Integumentary: No rash, pruritus, skin lesions Neurological: as above Psychiatric: No depression, insomnia, anxiety Endocrine: No palpitations, fatigue, diaphoresis, mood swings, change in appetite, change in weight, increased thirst Hematologic/Lymphatic:  No anemia, purpura, petechiae. Allergic/Immunologic: no itchy/runny eyes, nasal congestion, recent allergic  reactions, rashes  PHYSICAL EXAM: Filed Vitals:   07/16/13 1237  BP: 134/74  Pulse: 72  Resp: 16   General: No acute distress Head:  Normocephalic/atraumatic Neck: supple, no paraspinal tenderness, full range of motion Back: No paraspinal tenderness Heart: irregular rhythm Lungs: Clear to auscultation bilaterally. Vascular: No carotid bruits. Skin/Extremities: No rash, no edema Neurological Exam: Mental status: alert and oriented to person, place, and time, no dysarthria or dysphagia, Fund of knowledge is appropriate.  Recent and remote memory are intact.  Attention and concentration are normal.    Able to name objects and repeat phrases. Cranial nerves: CN I: not tested CN II: pupils equal, round and reactive to light, visual fields intact, fundi unremarkable. CN III, IV, VI:  full range of motion, no nystagmus, no ptosis CN V: facial sensation intact CN VII: upper and lower face symmetric CN VIII: hearing intact CN IX, X: gag intact, uvula midline CN XI: sternocleidomastoid and trapezius muscles intact CN XII: tongue midline Bulk & Tone: normal, no fasciculations. Motor: 5/5 throughout with no pronator drift. Sensation: decreased vibration to knee on the right, decreased to ankle on left.  Intact joint position sense.  Intact to light touch, cold, pin on both UE and LE.  No extinction to double simultaneous stimulation.  Romberg test negative Deep Tendon Reflexes: +1 throughout with absent ankle jerks bilaterally. No ankle clonus Plantar responses: downgoing bilaterally Finger to nose testing: no incoordination  Gait: narrow-based and steady, able to tandem walk adequately.  IMPRESSION: This is a very pleasant 80 year old right-handed woman with a history of hypertension, atrial fibrillation on anticoagulation, right parietal stroke, occipital neuralgia, and left cavernous carotid aneurysm. Occipital neuralgia is well-controlled on gabapentin 200mg  BID. Refills sent today.   Last CTA head was done in 2012, interval follow-up scan for the left cavernous carotid aneurysm and dilatation of A3 segment will be ordered. We discussed low annual risk of rupture in aneurysms less than 22mm, interval scans for follow-up, and control of hypertension. She is on chronic anticoagulation for atrial fibrillation and knows to go to the ER for any signs of severe headache or stroke-like symptoms.  She will follow-up in 6 months  or earlier if needed.    Thank you for allowing me to participate in the care of this patient. Please do not hesitate to call for any questions or concerns.   [name], M.D.

## 2013-07-16 NOTE — Patient Instructions (Addendum)
1. CTA head with and without contrast for aneurysm March 13 @10 :30 please arrive 15 minutes prior for check in [place] 2. Continue gabapentin 100mg  2 capsules twice a day 3. Follow-up in 6 months

## 2013-07-20 ENCOUNTER — Ambulatory Visit (HOSPITAL_COMMUNITY): Payer: Medicare Other

## 2013-07-20 ENCOUNTER — Ambulatory Visit (INDEPENDENT_AMBULATORY_CARE_PROVIDER_SITE_OTHER)
Admission: RE | Admit: 2013-07-20 | Discharge: 2013-07-20 | Disposition: A | Discharge status: Home / Self Care | Payer: Medicare Other | Source: Ambulatory Visit | Attending: Neurology | Admitting: Neurology

## 2013-07-20 DIAGNOSIS — M5481 Occipital neuralgia: Secondary | ICD-10-CM

## 2013-07-20 DIAGNOSIS — M531 Cervicobrachial syndrome: Secondary | ICD-10-CM | POA: Diagnosis not present

## 2013-07-20 DIAGNOSIS — I671 Cerebral aneurysm, nonruptured: Secondary | ICD-10-CM

## 2013-07-20 DIAGNOSIS — Z0000 Encounter for general adult medical examination without abnormal findings: Secondary | ICD-10-CM | POA: Diagnosis not present

## 2013-07-20 MED ORDER — IOHEXOL 350 MG/ML SOLN
80.0000 mL | Freq: Once | INTRAVENOUS | Status: AC | PRN
  Administered 2013-07-20: 80 mL via INTRAVENOUS

## 2013-08-27 ENCOUNTER — Ambulatory Visit: Payer: Medicare Other | Admitting: Pulmonary Disease

## 2013-08-31 ENCOUNTER — Other Ambulatory Visit: Payer: Self-pay | Admitting: Internal Medicine

## 2013-09-20 ENCOUNTER — Other Ambulatory Visit: Payer: Self-pay | Admitting: Internal Medicine

## 2013-09-26 ENCOUNTER — Encounter: Payer: Self-pay | Admitting: Internal Medicine

## 2013-09-26 ENCOUNTER — Ambulatory Visit (INDEPENDENT_AMBULATORY_CARE_PROVIDER_SITE_OTHER): Payer: Medicare Other | Admitting: Internal Medicine

## 2013-09-26 VITALS — BP 136/81 | HR 80 | Ht 66.0 in | Wt 182.6 lb

## 2013-09-26 DIAGNOSIS — I4891 Unspecified atrial fibrillation: Secondary | ICD-10-CM | POA: Diagnosis not present

## 2013-09-26 MED ORDER — AZITHROMYCIN 250 MG PO TABS: ORAL_TABLET | ORAL | Status: DC

## 2013-09-26 NOTE — Patient Instructions (Signed)
Your physician has recommended you make the following change in your medication:  1) Take Z pack as instructed  Your physician wants you to follow-up in: 1 year with Dr. Caryl Comes.  You will receive a reminder letter in the mail two months in advance. If you don't receive a letter, please call our office to schedule the follow-up appointment.

## 2013-09-26 NOTE — Progress Notes (Signed)
Samantha Bishop      Patient Care Team: Lucretia Kern, DO as PCP - General (Family Medicine)   HPI  Samantha Bishop is a 80 y.o. female seen in followup for atrial fibrillation associated with symptomatic palpitations. She is a CHADS VASC score of 4 and is on Pradaxa. She also has hypertension.  At her last visit we stopped her beta blockers because of alopecia  She is tolerating without symptoms.   Echo cardiogram 2010 demonstrated normal left ventricular function. She was last seen in our office about one year ago prior to knee surgery.     Her other complaints are alopecia which date back many years  we stopped her beta blockers and this problem has resolved  She is complaining currently of a recent sinus infection with some bronchitic components. In the past Dr. Lenna Gilford described a Z-Pak for her.  Past Medical History  Diagnosis Date  . HTN (hypertension)   . Atrial fibrillation     managed with rate control and anticoagulation.   . Tachycardia, unspecified   . Pulmonary emboli     following remote knee arthroscopy surgery  . Hyperlipidemia   . Diverticulosis of colon (without mention of hemorrhage)   . Benign neoplasm of colon   . Unspecified hemorrhoids without mention of complication   . Calculus of kidney   . Osteoarthrosis, unspecified whether generalized or localized, unspecified site   . Lumbago   . Cerebral aneurysm, nonruptured   . Anxiety state, unspecified   . Persistent disorder of initiating or maintaining sleep   . Chronic anticoagulation     on Pradaxa  . Atrial tachycardia     in the remote past with remote ablation  . UTI (urinary tract infection)     CURRENTLY BEING TX FOR UTI  . Esophageal reflux     YRS AGO - NO PROBLEM NOW  . History of skin cancer   . Ruptured lumbar disc   . Complication of anesthesia     "my heart stopped" 2004 knee  surg - see note on chart  . Sleep apnea     stop bang score 4   . DEEP VENOUS THROMBOPHLEBITIS 08/15/2007   Qualifier: History of  By: Lenna Gilford MD, [place] WITHOUT COMPS 09/04/2009    Qualifier: Diagnosis of  By: Ola Spurr MD, [name]    . OA (osteoarthritis) of knee 09/03/2011  . OSTEOPENIA 02/21/2010    Qualifier: Diagnosis of  By: Lenna Gilford MD, Deborra Medina   . NECK PAIN 08/07/2007    Past Surgical History  Procedure Laterality Date  . Tonsillectomy and adenoidectomy    . Appendectomy    . Abdominal hysterectomy    . Knee surgery    . Breast cyst excision    . Blocked intestine surgery    . Knee arthroscopy      X2 L KNEE  . Joint replacement  2004    RT TOTAL KNEE  . Nasal surgery x2    . Hemhorroidectomy    . Total knee arthroplasty  09/03/2011    Procedure: TOTAL KNEE ARTHROPLASTY;  Surgeon: Gearlean Alf, MD;  Location: [facility];  Service: Orthopedics;  Laterality: Left;    Current Outpatient Prescriptions  Medication Sig Dispense Refill  . atorvastatin (LIPITOR) 10 MG tablet Take 1 tablet (10 mg total) by mouth daily.  30 tablet  11  . CLIMARA 0.05 MG/24HR patch Place 1 patch onto the skin once a week.      Marland Kitchen  diltiazem (CARDIZEM CD) 120 MG 24 hr capsule TAKE 1 CAPSULE (120 MG TOTAL) BY MOUTH DAILY.  90 capsule  0  . gabapentin (NEURONTIN) 100 MG capsule 2 capsules twice a day  360 capsule  3  . hydrochlorothiazide (HYDRODIURIL) 25 MG tablet TAKE 1/2 TABLET BY MOUTH ONCE DAILY.  15 tablet  0  . lidocaine (LIDODERM) 5 % Place 1 patch onto the skin as needed. Remove & Discard patch within 12 hours or as directed by MD      . PRADAXA 150 MG CAPS capsule TAKE ONE CAPSULE BY MOUTH EVERY 12 HOURS  180 capsule  0  . simvastatin (ZOCOR) 20 MG tablet daily.      Marland Kitchen PREMARIN vaginal cream        No current facility-administered medications for this visit.    Allergies  Allergen Reactions  . Codeine     REACTION: causes nausea  . Penicillins     REACTION: rash    Review of Systems negative except from HPI and PMH  Physical Exam BP 136/81  Pulse 80  Ht 5\' 6"   (1.676 m)  Wt 182 lb 9.6 oz (82.827 kg)  BMI 29.49 kg/m2 Well developed and well nourished in no acute distress HENT normal E scleral and icterus clear Neck Supple JVP flat; carotids brisk and full Clear to ausculation  Irregular rate and rhythm, no murmurs gallops or rub Soft with active bowel sounds No clubbing cyanosis Trace Edema Alert and oriented, grossly normal motor and sensory function Skin Warm and Dry  ECG  Atrial Fibrillation controlled ventricular response  Assessment and  Plan  Atrial fibrillation  Sinusitis/bronchitis  Hypertension  Blood pressure is well-controlled. Atrial fibrillation has adequate heart rate control. She is tolerating her anticoagulants. Renal function was normal 2/15.  We will prescribe a Z-Pak for her bronchitis/sinusitis.

## 2013-09-26 NOTE — Addendum Note (Signed)
Addended by: Stanton Kidney on: 09/26/2013 01:28 PM   Modules accepted: Orders

## 2013-09-27 ENCOUNTER — Ambulatory Visit (INDEPENDENT_AMBULATORY_CARE_PROVIDER_SITE_OTHER): Payer: Medicare Other | Admitting: Family Medicine

## 2013-09-27 ENCOUNTER — Encounter: Payer: Self-pay | Admitting: Family Medicine

## 2013-09-27 VITALS — BP 124/82 | HR 85 | Temp 98.8°F | Ht 66.0 in | Wt 184.5 lb

## 2013-09-27 DIAGNOSIS — J329 Chronic sinusitis, unspecified: Secondary | ICD-10-CM | POA: Diagnosis not present

## 2013-09-27 MED ORDER — DOXYCYCLINE HYCLATE 100 MG PO CAPS: 100.0000 mg | ORAL_CAPSULE | Freq: Two times a day (BID) | ORAL | Status: DC

## 2013-09-27 NOTE — Progress Notes (Signed)
No chief complaint on file.   HPI:  -started: 11 days ago and worse the last 3 days -symptoms:nasal congestion, sore throat, cough -denies:fever, SOB, NVD, tooth pain -has tried: per notes cardiologist rxd zpack yesterday -sick contacts/travel/risks: denies flu exposure, tick exposure or or Ebola risks -Hx of: allergies ROS: See pertinent positives and negatives per HPI.  Past Medical History  Diagnosis Date  . HTN (hypertension)   . Atrial fibrillation     managed with rate control and anticoagulation.   . Tachycardia, unspecified   . Pulmonary emboli     following remote knee arthroscopy surgery  . Hyperlipidemia   . Diverticulosis of colon (without mention of hemorrhage)   . Benign neoplasm of colon   . Unspecified hemorrhoids without mention of complication   . Calculus of kidney   . Osteoarthrosis, unspecified whether generalized or localized, unspecified site   . Lumbago   . Cerebral aneurysm, nonruptured   . Anxiety state, unspecified   . Persistent disorder of initiating or maintaining sleep   . Chronic anticoagulation     on Pradaxa  . Atrial tachycardia     in the remote past with remote ablation  . UTI (urinary tract infection)     CURRENTLY BEING TX FOR UTI  . Esophageal reflux     YRS AGO - NO PROBLEM NOW  . History of skin cancer   . Ruptured lumbar disc   . Complication of anesthesia     "my heart stopped" 2004 knee  surg - see note on chart  . Sleep apnea     stop bang score 4   . DEEP VENOUS THROMBOPHLEBITIS 08/15/2007    Qualifier: History of  By: Lenna Gilford MD, Anderson WITHOUT COMPS 09/04/2009    Qualifier: Diagnosis of  By: Ola Spurr MD, [name]    . OA (osteoarthritis) of knee 09/03/2011  . OSTEOPENIA 02/21/2010    Qualifier: Diagnosis of  By: Lenna Gilford MD, Deborra Medina   . NECK PAIN 08/07/2007    Past Surgical History  Procedure Laterality Date  . Tonsillectomy and adenoidectomy    . Appendectomy    . Abdominal hysterectomy     . Knee surgery    . Breast cyst excision    . Blocked intestine surgery    . Knee arthroscopy      X2 L KNEE  . Joint replacement  2004    RT TOTAL KNEE  . Nasal surgery x2    . Hemhorroidectomy    . Total knee arthroplasty  09/03/2011    Procedure: TOTAL KNEE ARTHROPLASTY;  Surgeon: Gearlean Alf, MD;  Location: [facility];  Service: Orthopedics;  Laterality: Left;    Family History  Problem Relation Age of Onset  . Coronary artery disease Father   . Cirrhosis Brother   . Stroke Father     History   Social History  . Marital Status: Single    Spouse Name: N/A    Number of Children: 3  . Years of Education: N/A   Occupational History  . Retired    Social History Main Topics  . Smoking status: Never Smoker   . Smokeless tobacco: Never Used  . Alcohol Use: No  . Drug Use: No  . Sexual Activity: Not Currently   Other Topics Concern  . None   Social History Narrative  . None    Current outpatient prescriptions:atorvastatin (LIPITOR) 10 MG tablet, Take 1 tablet (10 mg total) by mouth daily.,  Disp: 30 tablet, Rfl: 11;  CLIMARA 0.05 MG/24HR patch, Place 1 patch onto the skin once a week., Disp: , Rfl: ;  diltiazem (CARDIZEM CD) 120 MG 24 hr capsule, TAKE 1 CAPSULE (120 MG TOTAL) BY MOUTH DAILY., Disp: 90 capsule, Rfl: 0;  gabapentin (NEURONTIN) 100 MG capsule, 2 capsules twice a day, Disp: 360 capsule, Rfl: 3 hydrochlorothiazide (HYDRODIURIL) 25 MG tablet, TAKE 1/2 TABLET BY MOUTH ONCE DAILY., Disp: 15 tablet, Rfl: 0;  lidocaine (LIDODERM) 5 %, Place 1 patch onto the skin as needed. Remove & Discard patch within 12 hours or as directed by MD, Disp: , Rfl: ;  PRADAXA 150 MG CAPS capsule, TAKE ONE CAPSULE BY MOUTH EVERY 12 HOURS, Disp: 180 capsule, Rfl: 0;  PREMARIN vaginal cream, , Disp: , Rfl: ;  simvastatin (ZOCOR) 20 MG tablet, daily., Disp: , Rfl:  doxycycline (VIBRAMYCIN) 100 MG capsule, Take 1 capsule (100 mg total) by mouth 2 (two) times daily., Disp: 20 capsule, Rfl:  0  EXAM:  Filed Vitals:   09/27/13 0917  BP: 124/82  Pulse: 85  Temp: 98.8 F (37.1 C)    Body mass index is 29.79 kg/(m^2).  GENERAL: vitals reviewed and listed above, alert, oriented, appears well hydrated and in no acute distress  HEENT: atraumatic, conjunttiva clear, no obvious abnormalities on inspection of external nose and ears, normal appearance of ear canals and TMs, clear nasal congestion, mild post oropharyngeal erythema with PND, no tonsillar edema or exudate, no sinus TTP  NECK: no obvious masses on inspection  LUNGS: clear to auscultation bilaterally, no wheezes, rales or rhonchi, good air movement  CV: HRRR, no peripheral edema  MS: moves all extremities without noticeable abnormality  PSYCH: pleasant and cooperative, no obvious depression or anxiety  ASSESSMENT AND PLAN:  Discussed the following assessment and plan:  Sinusitis - Plan: doxycycline (VIBRAMYCIN) 100 MG capsule  -discussed current recs for sinusitis -she wants stronger abx and doing doxy given her allergies, discussed risks, discussed could be expensive -advised to stop azithromycin as she wants something stronger -of course, we advised to return or notify a doctor immediately if symptoms worsen or persist or new concerns arise.    Patient Instructions  -stop the azithromycin  -start the doxycycline and complete this medication  Sinusitis Sinusitis is redness, soreness, and swelling (inflammation) of the paranasal sinuses. Paranasal sinuses are air pockets within the bones of your face (beneath the eyes, the middle of the forehead, or above the eyes). In healthy paranasal sinuses, mucus is able to drain out, and air is able to circulate through them by way of your nose. However, when your paranasal sinuses are inflamed, mucus and air can become trapped. This can allow bacteria and other germs to grow and cause infection. Sinusitis can develop quickly and last only a short time (acute) or  continue over a long period (chronic). Sinusitis that lasts for more than 12 weeks is considered chronic.  CAUSES  Causes of sinusitis include:  Allergies.  Structural abnormalities, such as displacement of the cartilage that separates your nostrils (deviated septum), which can decrease the air flow through your nose and sinuses and affect sinus drainage.  Functional abnormalities, such as when the small hairs (cilia) that line your sinuses and help remove mucus do not work properly or are not present. SYMPTOMS  Symptoms of acute and chronic sinusitis are the same. The primary symptoms are pain and pressure around the affected sinuses. Other symptoms include:  Upper toothache.  Earache.  Headache.  Bad breath.  Decreased sense of smell and taste.  A cough, which worsens when you are lying flat.  Fatigue.  Fever.  Thick drainage from your nose, which often is green and may contain pus (purulent).  Swelling and warmth over the affected sinuses. DIAGNOSIS  Your caregiver will perform a physical exam. During the exam, your caregiver may:  Look in your nose for signs of abnormal growths in your nostrils (nasal polyps).  Tap over the affected sinus to check for signs of infection.  View the inside of your sinuses (endoscopy) with a special imaging device with a light attached (endoscope), which is inserted into your sinuses. If your caregiver suspects that you have chronic sinusitis, one or more of the following tests may be recommended:  Allergy tests.  Nasal culture A sample of mucus is taken from your nose and sent to a lab and screened for bacteria.  Nasal cytology A sample of mucus is taken from your nose and examined by your caregiver to determine if your sinusitis is related to an allergy. TREATMENT  Most cases of acute sinusitis are related to a viral infection and will resolve on their own within 10 days. Sometimes medicines are prescribed to help relieve symptoms  (pain medicine, decongestants, nasal steroid sprays, or saline sprays).  However, for sinusitis related to a bacterial infection, your caregiver will prescribe antibiotic medicines. These are medicines that will help kill the bacteria causing the infection.  Rarely, sinusitis is caused by a fungal infection. In theses cases, your caregiver will prescribe antifungal medicine. For some cases of chronic sinusitis, surgery is needed. Generally, these are cases in which sinusitis recurs more than 3 times per year, despite other treatments. HOME CARE INSTRUCTIONS   Drink plenty of water. Water helps thin the mucus so your sinuses can drain more easily.  Use a humidifier.  Inhale steam 3 to 4 times a day (for example, sit in the bathroom with the shower running).  Apply a warm, moist washcloth to your face 3 to 4 times a day, or as directed by your caregiver.  Use saline nasal sprays to help moisten and clean your sinuses.  Take over-the-counter or prescription medicines for pain, discomfort, or fever only as directed by your caregiver. SEEK IMMEDIATE MEDICAL CARE IF:  You have increasing pain or severe headaches.  You have nausea, vomiting, or drowsiness.  You have swelling around your face.  You have vision problems.  You have a stiff neck.  You have difficulty breathing. MAKE SURE YOU:   Understand these instructions.  Will watch your condition.  Will get help right away if you are not doing well or get worse. Document Released: 04/26/2005 Document Revised: 07/19/2011 Document Reviewed: 05/11/2011 [facility] Patient Information 2014 [place], [place].      Lucretia Kern

## 2013-09-27 NOTE — Progress Notes (Signed)
Pre visit review using our clinic review tool, if applicable. No additional management support is needed unless otherwise documented below in the visit note. 

## 2013-09-27 NOTE — Patient Instructions (Signed)
-stop the azithromycin  -start the doxycycline and complete this medication  Sinusitis Sinusitis is redness, soreness, and swelling (inflammation) of the paranasal sinuses. Paranasal sinuses are air pockets within the bones of your face (beneath the eyes, the middle of the forehead, or above the eyes). In healthy paranasal sinuses, mucus is able to drain out, and air is able to circulate through them by way of your nose. However, when your paranasal sinuses are inflamed, mucus and air can become trapped. This can allow bacteria and other germs to grow and cause infection. Sinusitis can develop quickly and last only a short time (acute) or continue over a long period (chronic). Sinusitis that lasts for more than 12 weeks is considered chronic.  CAUSES  Causes of sinusitis include:  Allergies.  Structural abnormalities, such as displacement of the cartilage that separates your nostrils (deviated septum), which can decrease the air flow through your nose and sinuses and affect sinus drainage.  Functional abnormalities, such as when the small hairs (cilia) that line your sinuses and help remove mucus do not work properly or are not present. SYMPTOMS  Symptoms of acute and chronic sinusitis are the same. The primary symptoms are pain and pressure around the affected sinuses. Other symptoms include:  Upper toothache.  Earache.  Headache.  Bad breath.  Decreased sense of smell and taste.  A cough, which worsens when you are lying flat.  Fatigue.  Fever.  Thick drainage from your nose, which often is green and may contain pus (purulent).  Swelling and warmth over the affected sinuses. DIAGNOSIS  Your caregiver will perform a physical exam. During the exam, your caregiver may:  Look in your nose for signs of abnormal growths in your nostrils (nasal polyps).  Tap over the affected sinus to check for signs of infection.  View the inside of your sinuses (endoscopy) with a special  imaging device with a light attached (endoscope), which is inserted into your sinuses. If your caregiver suspects that you have chronic sinusitis, one or more of the following tests may be recommended:  Allergy tests.  Nasal culture A sample of mucus is taken from your nose and sent to a lab and screened for bacteria.  Nasal cytology A sample of mucus is taken from your nose and examined by your caregiver to determine if your sinusitis is related to an allergy. TREATMENT  Most cases of acute sinusitis are related to a viral infection and will resolve on their own within 10 days. Sometimes medicines are prescribed to help relieve symptoms (pain medicine, decongestants, nasal steroid sprays, or saline sprays).  However, for sinusitis related to a bacterial infection, your caregiver will prescribe antibiotic medicines. These are medicines that will help kill the bacteria causing the infection.  Rarely, sinusitis is caused by a fungal infection. In theses cases, your caregiver will prescribe antifungal medicine. For some cases of chronic sinusitis, surgery is needed. Generally, these are cases in which sinusitis recurs more than 3 times per year, despite other treatments. HOME CARE INSTRUCTIONS   Drink plenty of water. Water helps thin the mucus so your sinuses can drain more easily.  Use a humidifier.  Inhale steam 3 to 4 times a day (for example, sit in the bathroom with the shower running).  Apply a warm, moist washcloth to your face 3 to 4 times a day, or as directed by your caregiver.  Use saline nasal sprays to help moisten and clean your sinuses.  Take over-the-counter or prescription medicines for pain,  discomfort, or fever only as directed by your caregiver. SEEK IMMEDIATE MEDICAL CARE IF:  You have increasing pain or severe headaches.  You have nausea, vomiting, or drowsiness.  You have swelling around your face.  You have vision problems.  You have a stiff neck.  You have  difficulty breathing. MAKE SURE YOU:   Understand these instructions.  Will watch your condition.  Will get help right away if you are not doing well or get worse. Document Released: 04/26/2005 Document Revised: 07/19/2011 Document Reviewed: 05/11/2011 [facility] Patient Information 2014 [place], [place].

## 2013-10-02 ENCOUNTER — Other Ambulatory Visit: Payer: Self-pay | Admitting: Internal Medicine

## 2013-10-08 ENCOUNTER — Encounter: Payer: Medicare Other | Admitting: Family Medicine

## 2013-11-12 ENCOUNTER — Other Ambulatory Visit: Payer: Self-pay | Admitting: Internal Medicine

## 2013-12-05 DIAGNOSIS — D485 Neoplasm of uncertain behavior of skin: Secondary | ICD-10-CM | POA: Diagnosis not present

## 2013-12-05 DIAGNOSIS — D1801 Hemangioma of skin and subcutaneous tissue: Secondary | ICD-10-CM | POA: Diagnosis not present

## 2013-12-05 DIAGNOSIS — D235 Other benign neoplasm of skin of trunk: Secondary | ICD-10-CM | POA: Diagnosis not present

## 2013-12-05 DIAGNOSIS — L821 Other seborrheic keratosis: Secondary | ICD-10-CM | POA: Diagnosis not present

## 2013-12-17 ENCOUNTER — Other Ambulatory Visit: Payer: Self-pay | Admitting: Internal Medicine

## 2013-12-19 ENCOUNTER — Other Ambulatory Visit: Payer: Self-pay | Admitting: Family Medicine

## 2013-12-25 NOTE — Telephone Encounter (Signed)
I called the pt to ask her if she was still having problems, she needed to be seen and she stated she did not request a refill on the antibiotic and she called the pharmacy and told them also.

## 2014-01-24 DIAGNOSIS — H04569 Stenosis of unspecified lacrimal punctum: Secondary | ICD-10-CM | POA: Diagnosis not present

## 2014-02-12 ENCOUNTER — Other Ambulatory Visit: Payer: Self-pay | Admitting: Internal Medicine

## 2014-02-26 DIAGNOSIS — H1045 Other chronic allergic conjunctivitis: Secondary | ICD-10-CM | POA: Diagnosis not present

## 2014-02-26 DIAGNOSIS — J309 Allergic rhinitis, unspecified: Secondary | ICD-10-CM | POA: Diagnosis not present

## 2014-03-07 ENCOUNTER — Ambulatory Visit (INDEPENDENT_AMBULATORY_CARE_PROVIDER_SITE_OTHER): Payer: Medicare Other | Admitting: Internal Medicine

## 2014-03-07 ENCOUNTER — Encounter: Payer: Self-pay | Admitting: Internal Medicine

## 2014-03-07 VITALS — BP 136/76 | HR 82 | Temp 98.1°F | Resp 16 | Ht 65.0 in | Wt 186.8 lb

## 2014-03-07 DIAGNOSIS — R7302 Impaired glucose tolerance (oral): Secondary | ICD-10-CM

## 2014-03-07 DIAGNOSIS — E785 Hyperlipidemia, unspecified: Secondary | ICD-10-CM | POA: Diagnosis not present

## 2014-03-07 DIAGNOSIS — M5481 Occipital neuralgia: Secondary | ICD-10-CM | POA: Diagnosis not present

## 2014-03-07 DIAGNOSIS — Z23 Encounter for immunization: Secondary | ICD-10-CM

## 2014-03-07 DIAGNOSIS — R7309 Other abnormal glucose: Secondary | ICD-10-CM

## 2014-03-07 DIAGNOSIS — Z0000 Encounter for general adult medical examination without abnormal findings: Secondary | ICD-10-CM

## 2014-03-07 DIAGNOSIS — I4891 Unspecified atrial fibrillation: Secondary | ICD-10-CM | POA: Diagnosis not present

## 2014-03-07 DIAGNOSIS — I10 Essential (primary) hypertension: Secondary | ICD-10-CM | POA: Diagnosis not present

## 2014-03-07 DIAGNOSIS — M8580 Other specified disorders of bone density and structure, unspecified site: Secondary | ICD-10-CM | POA: Diagnosis not present

## 2014-03-07 NOTE — Progress Notes (Signed)
Pre visit review using our clinic review tool, if applicable. No additional management support is needed unless otherwise documented below in the visit note. 

## 2014-03-07 NOTE — Patient Instructions (Addendum)
We have given you the flu shot today. We will check some blood work today.  You are doing really well with your health and we will see you back in about 6-12 months to check on you.  Remember to call your insurance and find out if they will pay for the shingles shot.

## 2014-03-08 DIAGNOSIS — Z0000 Encounter for general adult medical examination without abnormal findings: Secondary | ICD-10-CM | POA: Insufficient documentation

## 2014-03-08 NOTE — Assessment & Plan Note (Signed)
Continue zocor, last lipid panel reviewed.

## 2014-03-08 NOTE — Assessment & Plan Note (Signed)
Continue gabapentin and doing well.

## 2014-03-08 NOTE — Assessment & Plan Note (Signed)
She continues on diltiazem, pradaxa. Denies palpitations. Occ irreg beat on exam.

## 2014-03-08 NOTE — Assessment & Plan Note (Signed)
Will review last DEXA. Continue vit d and calcium as well as weight bearing activity.

## 2014-03-08 NOTE — Progress Notes (Signed)
   Subjective:    Patient ID: Samantha Bishop, female    DOB: [birth year], 80 y.o.   MRN: [id number]  HPI The patient is an 80 YO female who is coming in to establish care. She has PMH of A fib, gerd, HTN, occipital neuralgia. She is doing well overall and has no new complaints today. She denies chest pains and tries to exercise with walking daily. She denies joint pain or balance problems. She denies SOB or acid reflux, abdominal pain, diarrhea, constipation. Her gabapentin works really well for her occipital neuralgia.  Review of Systems  Constitutional: Negative for fever, activity change, appetite change, fatigue and unexpected weight change.  HENT: Negative.   Eyes: Negative.   Respiratory: Negative for cough, chest tightness, shortness of breath and wheezing.   Cardiovascular: Negative for chest pain, palpitations and leg swelling.  Gastrointestinal: Negative for nausea, abdominal pain, diarrhea, constipation and abdominal distention.  Genitourinary: Negative.   Musculoskeletal: Negative for arthralgias, back pain and myalgias.  Neurological: Negative for dizziness, weakness, light-headedness and numbness.      Objective:   Physical Exam  Constitutional: She is oriented to person, place, and time. She appears well-developed and well-nourished. No distress.  HENT:  Head: Normocephalic and atraumatic.  Eyes: EOM are normal.  Neck: Normal range of motion.  Cardiovascular: Normal rate.   Pulmonary/Chest: Effort normal and breath sounds normal.  Abdominal: Soft. Bowel sounds are normal. She exhibits no distension. There is no tenderness. There is no rebound.  Neurological: She is alert and oriented to person, place, and time. Coordination normal.  Skin: Skin is warm and dry.   Filed Vitals:   03/07/14 0850  BP: 136/76  Pulse: 82  Temp: 98.1 F (36.7 C)  TempSrc: Oral  Resp: 16  Height: 5\' 5"  (1.651 m)  Weight: 186 lb 12.8 oz (84.732 kg)  SpO2: 97%      Assessment & Plan:    Flu shot given today.

## 2014-03-08 NOTE — Assessment & Plan Note (Signed)
Patient is on diltiazem 120 mg and hctz 12.5 mg daily. BP well controlled and check BMP today.

## 2014-03-08 NOTE — Assessment & Plan Note (Signed)
Flu shot given today. Check BMP and Hga1c. Up to date on other health maintenance.

## 2014-03-15 ENCOUNTER — Other Ambulatory Visit: Payer: Self-pay | Admitting: Internal Medicine

## 2014-03-19 DIAGNOSIS — J309 Allergic rhinitis, unspecified: Secondary | ICD-10-CM | POA: Diagnosis not present

## 2014-03-19 DIAGNOSIS — H1045 Other chronic allergic conjunctivitis: Secondary | ICD-10-CM | POA: Diagnosis not present

## 2014-04-24 DIAGNOSIS — N8111 Cystocele, midline: Secondary | ICD-10-CM | POA: Diagnosis not present

## 2014-05-05 ENCOUNTER — Other Ambulatory Visit: Payer: Self-pay | Admitting: Internal Medicine

## 2014-05-07 ENCOUNTER — Telehealth: Payer: Self-pay | Admitting: *Deleted

## 2014-05-07 MED ORDER — SIMVASTATIN 20 MG PO TABS: 20.0000 mg | ORAL_TABLET | Freq: Every day | ORAL | Status: DC

## 2014-05-07 NOTE — Telephone Encounter (Signed)
Refilled patient's Simvastatin 20 mg but she states she is supposed to take 1/2 tablet daily per Dr. Caryl Comes but the directions say 1 tablet daily. Please advise.

## 2014-05-08 DIAGNOSIS — N8111 Cystocele, midline: Secondary | ICD-10-CM | POA: Diagnosis not present

## 2014-05-13 NOTE — Telephone Encounter (Signed)
Patient is correct. Please refill for Simvastatin 20 mg - take 1/2 tablet daily.

## 2014-05-14 ENCOUNTER — Other Ambulatory Visit: Payer: Self-pay

## 2014-05-14 ENCOUNTER — Other Ambulatory Visit: Payer: Self-pay | Admitting: *Deleted

## 2014-05-14 MED ORDER — SIMVASTATIN 20 MG PO TABS: 10.0000 mg | ORAL_TABLET | Freq: Every day | ORAL | Status: DC

## 2014-05-14 MED ORDER — HYDROCHLOROTHIAZIDE 25 MG PO TABS: ORAL_TABLET | ORAL | Status: DC

## 2014-06-11 ENCOUNTER — Other Ambulatory Visit: Payer: Self-pay | Admitting: Internal Medicine

## 2014-07-01 ENCOUNTER — Ambulatory Visit (INDEPENDENT_AMBULATORY_CARE_PROVIDER_SITE_OTHER): Payer: Medicare Other | Admitting: Internal Medicine

## 2014-07-01 ENCOUNTER — Encounter: Payer: Self-pay | Admitting: Internal Medicine

## 2014-07-01 VITALS — BP 130/80 | HR 85 | Temp 97.9°F | Resp 16 | Ht 65.0 in | Wt 189.1 lb

## 2014-07-01 DIAGNOSIS — K623 Rectal prolapse: Secondary | ICD-10-CM

## 2014-07-01 NOTE — Patient Instructions (Signed)
We will contact [facility] to see if we can get their notes from your visit and let them know that you are still having problems. We would like you to call their office to make another appointment.   With the bowels we would like you to try taking fiber everyday to help keep yourself from straining so that you do not have as many problems with that. If you have that happen and are not able to get it back in call our office or seek emergency care.   High-Fiber Diet Fiber is found in fruits, vegetables, and grains. A high-fiber diet encourages the addition of more whole grains, legumes, fruits, and vegetables in your diet. The recommended amount of fiber for adult males is 38 g per day. For adult females, it is 25 g per day. Pregnant and lactating women should get 28 g of fiber per day. If you have a digestive or bowel problem, ask your caregiver for advice before adding high-fiber foods to your diet. Eat a variety of high-fiber foods instead of only a select few type of foods.  PURPOSE  To increase stool bulk.  To make bowel movements more regular to prevent constipation.  To lower cholesterol.  To prevent overeating. WHEN IS THIS DIET USED?  It may be used if you have constipation and hemorrhoids.  It may be used if you have uncomplicated diverticulosis (intestine condition) and irritable bowel syndrome.  It may be used if you need help with weight management.  It may be used if you want to add it to your diet as a protective measure against atherosclerosis, diabetes, and cancer. SOURCES OF FIBER  Whole-grain breads and cereals.  Fruits, such as apples, oranges, bananas, berries, prunes, and pears.  Vegetables, such as green peas, carrots, sweet potatoes, beets, broccoli, cabbage, spinach, and artichokes.  Legumes, such split peas, soy, lentils.  Almonds. FIBER CONTENT IN FOODS Starches and Grains / Dietary Fiber (g)  Cheerios, 1 cup / 3 g  Corn Flakes cereal, 1 cup /  0.7 g  Rice crispy treat cereal, 1 cup / 0.3 g  Instant oatmeal (cooked),  cup / 2 g  Frosted wheat cereal, 1 cup / 5.1 g  Brown, long-grain rice (cooked), 1 cup / 3.5 g  White, long-grain rice (cooked), 1 cup / 0.6 g  Enriched macaroni (cooked), 1 cup / 2.5 g Legumes / Dietary Fiber (g)  Baked beans (canned, plain, or vegetarian),  cup / 5.2 g  Kidney beans (canned),  cup / 6.8 g  Pinto beans (cooked),  cup / 5.5 g Breads and Crackers / Dietary Fiber (g)  Plain or honey graham crackers, 2 squares / 0.7 g  Saltine crackers, 3 squares / 0.3 g  Plain, salted pretzels, 10 pieces / 1.8 g  Whole-wheat bread, 1 slice / 1.9 g  White bread, 1 slice / 0.7 g  Raisin bread, 1 slice / 1.2 g  Plain bagel, 3 oz / 2 g  Flour tortilla, 1 oz / 0.9 g  Corn tortilla, 1 small / 1.5 g  Hamburger or hotdog bun, 1 small / 0.9 g Fruits / Dietary Fiber (g)  Apple with skin, 1 medium / 4.4 g  Sweetened applesauce,  cup / 1.5 g  Banana,  medium / 1.5 g  Grapes, 10 grapes / 0.4 g  Orange, 1 small / 2.3 g  Raisin, 1.5 oz / 1.6 g  Melon, 1 cup / 1.4 g Vegetables / Dietary Fiber (g)  Nyoka Cowden  beans (canned),  cup / 1.3 g  Carrots (cooked),  cup / 2.3 g  Broccoli (cooked),  cup / 2.8 g  Peas (cooked),  cup / 4.4 g  Mashed potatoes,  cup / 1.6 g  Lettuce, 1 cup / 0.5 g  Corn (canned),  cup / 1.6 g  Tomato,  cup / 1.1 g Document Released: 04/26/2005 Document Revised: 10/26/2011 Document Reviewed: 07/29/2011 [employer] Patient Information 2015 [place], [place]. This information is not intended to replace advice given to you by your health care provider. Make sure you discuss any questions you have with your health care provider.

## 2014-07-01 NOTE — Progress Notes (Signed)
Pre visit review using our clinic review tool, if applicable. No additional management support is needed unless otherwise documented below in the visit note. 

## 2014-07-02 DIAGNOSIS — K623 Rectal prolapse: Secondary | ICD-10-CM | POA: Insufficient documentation

## 2014-07-02 NOTE — Progress Notes (Signed)
   Subjective:    Patient ID: Samantha Bishop, female    DOB: [birth year], 81 y.o.   MRN: [id number]  HPI The patient is an 81 YO woman who is coming in today for bladder and rectal prolapse. She has been having some bladder prolapse for about 6 months and has seen urologist. They have tried a pessary on her but she is not liking this at all. It burns and causes her pain. She has tried some vaseline on the area to calm it down and it has helped. She now notices that when she is straining hard for bowel movement her rectum prolapses. She is always able to get it back in. Denies fevers or chills. She wants to know what to do to keep her bowels better.   Review of Systems  Constitutional: Negative.   Eyes: Negative.   Respiratory: Negative.   Cardiovascular: Negative.   Gastrointestinal:       Rectal prolapse with straining  Genitourinary: Negative for dysuria, frequency, flank pain and difficulty urinating.       Bladder prolapse with urinating.       Objective:   Physical Exam  Constitutional: She is oriented to person, place, and time. She appears well-developed and well-nourished. No distress.  HENT:  Head: Normocephalic and atraumatic.  Eyes: EOM are normal.  Neck: Normal range of motion.  Cardiovascular: Normal rate.   Pulmonary/Chest: Effort normal and breath sounds normal.  Abdominal: Soft. Bowel sounds are normal. She exhibits no distension. There is no tenderness. There is no rebound.  Neurological: She is alert and oriented to person, place, and time. Coordination normal.  Skin: Skin is warm and dry.   Filed Vitals:   07/01/14 1359  BP: 130/80  Pulse: 85  Temp: 97.9 F (36.6 C)  TempSrc: Oral  Resp: 16  Height: 5\' 5"  (1.651 m)  Weight: 189 lb 1.9 oz (85.784 kg)  SpO2: 97%      Assessment & Plan:

## 2014-07-02 NOTE — Assessment & Plan Note (Signed)
Given instruction for high fiber diet and enough fluids to prevent straining. She already has apt with GI in April and she can keep that. Advised if she is unable to put back in to seek emergency care. With the bladder advised to return to urology and will request their records.

## 2014-07-27 ENCOUNTER — Encounter (HOSPITAL_COMMUNITY): Payer: Self-pay | Admitting: Emergency Medicine

## 2014-07-27 ENCOUNTER — Emergency Department (INDEPENDENT_AMBULATORY_CARE_PROVIDER_SITE_OTHER)
Admission: EM | Admit: 2014-07-27 | Discharge: 2014-07-27 | Disposition: A | Discharge status: Home / Self Care | Payer: Medicare Other | Source: Home / Self Care | Attending: Family Medicine | Admitting: Family Medicine

## 2014-07-27 DIAGNOSIS — M5481 Occipital neuralgia: Secondary | ICD-10-CM | POA: Diagnosis not present

## 2014-07-27 MED ORDER — HYDROCODONE-ACETAMINOPHEN 5-325 MG PO TABS: 1.0000 | ORAL_TABLET | ORAL | Status: DC | PRN

## 2014-07-27 MED ORDER — DEXAMETHASONE SODIUM PHOSPHATE 10 MG/ML IJ SOLN
8.0000 mg | Freq: Once | INTRAMUSCULAR | Status: AC
  Administered 2014-07-27: 8 mg via INTRAMUSCULAR

## 2014-07-27 MED ORDER — DEXAMETHASONE SODIUM PHOSPHATE 10 MG/ML IJ SOLN
INTRAMUSCULAR | Status: AC
  Filled 2014-07-27: qty 1

## 2014-07-27 MED ORDER — DICLOFENAC SODIUM 1 % TD GEL: 1.0000 "application " | Freq: Four times a day (QID) | TRANSDERMAL | Status: DC

## 2014-07-27 NOTE — ED Provider Notes (Signed)
CSN: [id number]     Arrival date & time 07/27/14  9629 History   First MD Initiated Contact with Patient 07/27/14 1022     Chief Complaint  Patient presents with  . Neck Pain  . Headache    head pain   (Consider location/radiation/quality/duration/timing/severity/associated sxs/prior Treatment) HPI Comments: 81 year old female well-preserved and self-sufficient, living alone is complaining of a recurrence of her occipital neuralgia. She had this diagnosis for at least a couple of years. Approximately 2 days ago this reoccurred suddenly and is complaining of a highly localized area of pain at the base of the occipitalis where the splenius capitis inserts to the cranium. It is painful to flex or rotate her neck. Denies problems with vision, speech, hearing, swallowing, memory, confusion or orientation. This is exactly the same type pain and she has been dealing with for these years. Taking gabapentin 200 mg bid. Has tried local xylocaine injections with modest relief and does not want those anymore. Lidoderm patch does not work.    Past Medical History  Diagnosis Date  . HTN (hypertension)   . Atrial fibrillation     managed with rate control and anticoagulation.   . Tachycardia, unspecified   . Pulmonary emboli     following remote knee arthroscopy surgery  . Hyperlipidemia   . Diverticulosis of colon (without mention of hemorrhage)   . Benign neoplasm of colon   . Unspecified hemorrhoids without mention of complication   . Calculus of kidney   . Osteoarthrosis, unspecified whether generalized or localized, unspecified site   . Lumbago   . Cerebral aneurysm, nonruptured   . Anxiety state, unspecified   . Persistent disorder of initiating or maintaining sleep   . Chronic anticoagulation     on Pradaxa  . Atrial tachycardia     in the remote past with remote ablation  . UTI (urinary tract infection)     CURRENTLY BEING TX FOR UTI  . Esophageal reflux     YRS AGO - NO PROBLEM NOW   . History of skin cancer   . Ruptured lumbar disc   . Complication of anesthesia     "my heart stopped" 2004 knee  surg - see note on chart  . Sleep apnea     stop bang score 4   . DEEP VENOUS THROMBOPHLEBITIS 08/15/2007    Qualifier: History of  By: Lenna Gilford MD, [place] WITHOUT COMPS 09/04/2009    Qualifier: Diagnosis of  By: Ola Spurr MD, [name]    . OA (osteoarthritis) of knee 09/03/2011  . OSTEOPENIA 02/21/2010    Qualifier: Diagnosis of  By: Lenna Gilford MD, Deborra Medina   . NECK PAIN 08/07/2007   Past Surgical History  Procedure Laterality Date  . Tonsillectomy and adenoidectomy    . Appendectomy    . Abdominal hysterectomy    . Knee surgery    . Breast cyst excision    . Blocked intestine surgery    . Knee arthroscopy      X2 L KNEE  . Joint replacement  2004    RT TOTAL KNEE  . Nasal surgery x2    . Hemhorroidectomy    . Total knee arthroplasty  09/03/2011    Procedure: TOTAL KNEE ARTHROPLASTY;  Surgeon: Gearlean Alf, MD;  Location: [facility];  Service: Orthopedics;  Laterality: Left;   Family History  Problem Relation Age of Onset  . Coronary artery disease Father   . Cirrhosis Brother   .  Stroke Father    History  Substance Use Topics  . Smoking status: Never Smoker   . Smokeless tobacco: Never Used  . Alcohol Use: No   OB History    No data available     Review of Systems  Constitutional: Positive for activity change. Negative for fever, chills and fatigue.  Eyes: Negative.   Respiratory: Negative.   Cardiovascular: Negative.   Gastrointestinal: Negative for vomiting.  Musculoskeletal: Positive for neck pain and neck stiffness.  Skin: Negative.   Neurological: Positive for headaches. Negative for dizziness, tremors, seizures, syncope, facial asymmetry, speech difficulty, weakness, light-headedness and numbness.       The headache she describes is located at the base of the occipital cranium. There is no generalized or frontal type  headache. No throbbing.  Psychiatric/Behavioral: Negative.     Allergies  Codeine and Penicillins  Home Medications   Prior to Admission medications   Medication Sig Start Date End Date Taking? Authorizing Provider  CLIMARA 0.05 MG/24HR patch Place 1 patch onto the skin once a week. 01/29/13   Historical Provider, MD  diclofenac sodium (VOLTAREN) 1 % GEL Apply 1 application topically 4 (four) times daily. 07/27/14   Janne Napoleon, NP  diltiazem (CARDIZEM CD) 120 MG 24 hr capsule TAKE 1 CAPSULE (120 MG TOTAL) BY MOUTH DAILY. 06/13/14   Deboraha Sprang, MD  gabapentin (NEURONTIN) 100 MG capsule 2 capsules twice a day 07/16/13   Cameron Sprang, MD  hydrochlorothiazide (HYDRODIURIL) 25 MG tablet TAKE 1/2 TABLET BY MOUTH ONCE DAILY. 06/13/14   Deboraha Sprang, MD  HYDROcodone-acetaminophen (NORCO/VICODIN) 5-325 MG per tablet Take 1 tablet by mouth every 4 (four) hours as needed. 07/27/14   Janne Napoleon, NP  lidocaine (LIDODERM) 5 % Place 1 patch onto the skin as needed. Remove & Discard patch within 12 hours or as directed by MD 08/22/12   Noralee Space, MD  montelukast (SINGULAIR) 10 MG tablet Take 10 mg by mouth at bedtime.  06/28/14   Historical Provider, MD  PAZEO 0.7 % SOLN As needed 05/29/14   Historical Provider, MD  PRADAXA 150 MG CAPS capsule TAKE ONE CAPSULE BY MOUTH EVERY 12 HOURS 02/13/14   Deboraha Sprang, MD  PREMARIN vaginal cream Place 1 Applicatorful vaginally.  05/06/13   Historical Provider, MD  simvastatin (ZOCOR) 20 MG tablet Take 0.5 tablets (10 mg total) by mouth daily. 05/14/14   Deboraha Sprang, MD   BP 148/74 mmHg  Pulse 83  Temp(Src) 98.3 F (36.8 C) (Oral)  Resp 20  SpO2 94% Physical Exam  Constitutional: She is oriented to person, place, and time. She appears well-developed and well-nourished. No distress.  HENT:  Head: Normocephalic and atraumatic.  Mouth/Throat: Oropharynx is clear and moist.  Eyes: EOM are normal. Pupils are equal, round, and reactive to light.  Neck:   Decreased range of motion of neck due to posterior neck and occipital pain. There is tenderness to the bilateral splenius capitis muscles at the insertion point to the cranium. There are no other areas of tenderness. The muscle itself is not tender. There is no tenderness to the scalp. No mid cervical spine tenderness or deformity.  Cardiovascular: Normal rate.   Pulmonary/Chest: Effort normal. No respiratory distress.  Lymphadenopathy:    She has no cervical adenopathy.  Neurological: She is alert and oriented to person, place, and time. She has normal strength. No cranial nerve deficit or sensory deficit. She exhibits normal muscle tone. Coordination and gait normal.  Skin: Skin is warm and dry.  Nursing note and vitals reviewed.   ED Course  Procedures (including critical care time) Labs Review Labs Reviewed - No data to display  Imaging Review No results found.   MDM   1. Bilateral occipital neuralgia    Increase your neurontin to 400 mg at night and 200  Mg in the morning. Norco for pain as directed. Diclofenac gel Decadron 8 mg IM now.    Janne Napoleon, NP 07/27/14 [place], NP 07/27/14 1059

## 2014-07-27 NOTE — Discharge Instructions (Signed)
Occipital Neuralgia Increase your neurontin to 400 mg at night and 200  Mg in the morning. Norco for pain as directed. Occipital neuralgia is a type of headache that causes episodes of very bad pain in the back of your head. Pain from occipital neuralgia may spread (radiate) to other parts of your head. The pain is usually brief and often goes away after you rest and relax. These headaches may be caused by irritation of the nerves that leave your spinal cord high up in your neck, just below the base of your skull (occipital nerves). Your occipital nerves transmit sensations from the back of your head, the top of your head, and the areas behind your ears. CAUSES Occipital neuralgia can occur without any known cause (primary headache syndrome). In other cases, occipital neuralgia is caused by pressure on or irritation of one of the two occipital nerves. Causes of occipital nerve compression or irritation include:  Wear and tear of the vertebrae in the neck (osteoarthritis).  Neck injury.  Disease of the disks that separate the vertebrae.  Tumors.  Gout.  Infections.  Diabetes.  Swollen blood vessels that put pressure on the occipital nerves.  Muscle spasm in the neck. SIGNS AND SYMPTOMS Pain is the main symptom of occipital neuralgia. It usually starts in the back of the head but may also be felt in other areas supplied by the occipital nerves. Pain is usually on one side but may be on both sides. You may have:   Brief episodes of very bad pain that is burning, stabbing, shocking, or shooting.  Pain behind the eye.  Pain triggered by neck movement or hair brushing.  Scalp tenderness.  Aching in the back of the head between episodes of very bad pain. DIAGNOSIS  Your health care provider may diagnose occipital neuralgia based on your symptoms and a physical exam. During the exam, the health care provider may push on areas supplied by the occipital nerves to see if they are painful.  Some tests may also be done to help in making the diagnosis. These may include:  Imaging studies of the upper spinal cord, such as an MRI or CT scan. These may show compression or spinal cord abnormalities.  Nerve block. You will get an injection of numbing medicine (local anesthetic) near the occipital nerve to see if this relieves pain. TREATMENT  Treatment may begin with simple measures, such as:   Rest.  Massage.  Heat.  Over-the-counter pain relievers. If these measures do not work, you may need other treatments, including:  Medicines such as:  Prescription-strength anti-inflammatory medicines.  Muscle relaxants.  Antiseizure medicines.  Antidepressants.  Steroid injection. This involves injections of local anesthetic and strong anti-inflammatory drugs (steroids).  Pulsed radiofrequency. Wires are implanted to deliver electrical impulses that block pain signals from the occipital nerve.  Physical therapy.  Surgery to relieve nerve pressure. HOME CARE INSTRUCTIONS  Take all medicines as directed by your health care provider.  Avoid activities that cause pain.  Rest when you have an attack of pain.  Try gentle massage or a heating pad to relieve pain.  Work with a physical therapist to learn stretching exercises you can do at home.  Try a different pillow or sleeping position.  Practice good posture.  Try to stay active. Get regular exercise that does not cause pain. Ask your health care provider to suggest safe exercises for you.  Keep all follow-up visits as directed by your health care provider. This is important. SEEK  MEDICAL CARE IF:  Your medicine is not working.  You have new or worsening symptoms. SEEK IMMEDIATE MEDICAL CARE IF:  You have very bad head pain that is not going away.  You have a sudden change in vision, balance, or speech. MAKE SURE YOU:  Understand these instructions.  Will watch your condition.  Will get help right away  if you are not doing well or get worse. Document Released: 04/20/2001 Document Revised: 09/10/2013 Document Reviewed: 04/18/2013 [facility] Patient Information 2015 [place], [place]. This information is not intended to replace advice given to you by your health care provider. Make sure you discuss any questions you have with your health care provider.

## 2014-07-29 DIAGNOSIS — N8111 Cystocele, midline: Secondary | ICD-10-CM | POA: Diagnosis not present

## 2014-08-14 ENCOUNTER — Ambulatory Visit (INDEPENDENT_AMBULATORY_CARE_PROVIDER_SITE_OTHER): Payer: Medicare Other | Admitting: Gastroenterology

## 2014-08-14 ENCOUNTER — Encounter: Payer: Self-pay | Admitting: Gastroenterology

## 2014-08-14 VITALS — BP 128/64 | HR 84 | Ht 65.0 in | Wt 189.0 lb

## 2014-08-14 DIAGNOSIS — K623 Rectal prolapse: Secondary | ICD-10-CM | POA: Diagnosis not present

## 2014-08-14 DIAGNOSIS — R1032 Left lower quadrant pain: Secondary | ICD-10-CM

## 2014-08-14 NOTE — Progress Notes (Signed)
History of Present Illness: This is an 81 year old female  referred by Olga Millers, MD for the evaluation of rectal prolapse. Colonoscopy by Dr. Velora Heckler in 2007 showed diverticulosis and a tortuous colon. Dr. Diona Fanti diagnosed bladder prolapse and rectal prolapse. Pt does not note rectal prolapse. Patient does note occasional brief left lower quadrant discomfort during bowel movements. These symptoms resolved immediately upon evacuation. Patient states that she had a cardiac complication during knee replacement surgery performed about 10 years ago where her "heart stopped". The surgery was completed. She has been advised to avoid general anesthesia if possible. Denies weight loss, constipation, diarrhea, change in stool caliber, melena, hematochezia, nausea, vomiting, dysphagia, reflux symptoms, chest pain.   Allergies  Allergen Reactions  . Codeine     REACTION: causes nausea  . Penicillins     REACTION: rash   Outpatient Prescriptions Prior to Visit  Medication Sig Dispense Refill  . CLIMARA 0.05 MG/24HR patch Place 1 patch onto the skin once a week.    . diclofenac sodium (VOLTAREN) 1 % GEL Apply 1 application topically 4 (four) times daily. 100 g 0  . diltiazem (CARDIZEM CD) 120 MG 24 hr capsule TAKE 1 CAPSULE (120 MG TOTAL) BY MOUTH DAILY. 90 capsule 1  . gabapentin (NEURONTIN) 100 MG capsule 2 capsules twice a day 360 capsule 3  . hydrochlorothiazide (HYDRODIURIL) 25 MG tablet TAKE 1/2 TABLET BY MOUTH ONCE DAILY. 45 tablet 1  . HYDROcodone-acetaminophen (NORCO/VICODIN) 5-325 MG per tablet Take 1 tablet by mouth every 4 (four) hours as needed. 15 tablet 0  . lidocaine (LIDODERM) 5 % Place 1 patch onto the skin as needed. Remove & Discard patch within 12 hours or as directed by MD    . montelukast (SINGULAIR) 10 MG tablet Take 10 mg by mouth at bedtime.     Marland Kitchen PAZEO 0.7 % SOLN As needed  1  . PRADAXA 150 MG CAPS capsule TAKE ONE CAPSULE BY MOUTH EVERY 12 HOURS 180 capsule 1    . PREMARIN vaginal cream Place 1 Applicatorful vaginally.     . simvastatin (ZOCOR) 20 MG tablet Take 0.5 tablets (10 mg total) by mouth daily. 90 tablet 1   No facility-administered medications prior to visit.   Past Medical History  Diagnosis Date  . HTN (hypertension)   . Atrial fibrillation     managed with rate control and anticoagulation.   . Tachycardia, unspecified   . Pulmonary emboli     following remote knee arthroscopy surgery  . Hyperlipidemia   . Diverticulosis of colon (without mention of hemorrhage)   . Unspecified hemorrhoids without mention of complication   . Calculus of kidney   . Osteoarthrosis, unspecified whether generalized or localized, unspecified site   . Lumbago   . Cerebral aneurysm, nonruptured   . Anxiety state, unspecified   . Persistent disorder of initiating or maintaining sleep   . Chronic anticoagulation     on Pradaxa  . Atrial tachycardia     in the remote past with remote ablation  . UTI (urinary tract infection)     CURRENTLY BEING TX FOR UTI  . Esophageal reflux     YRS AGO - NO PROBLEM NOW  . History of skin cancer   . Ruptured lumbar disc   . Complication of anesthesia     "my heart stopped" 2004 knee  surg - see note on chart  . Sleep apnea     stop bang score 4   .  DEEP VENOUS THROMBOPHLEBITIS 08/15/2007    Qualifier: History of  By: Lenna Gilford MD, [place] WITHOUT COMPS 09/04/2009    Qualifier: Diagnosis of  By: Ola Spurr MD, [name]    . OA (osteoarthritis) of knee 09/03/2011  . OSTEOPENIA 02/21/2010    Qualifier: Diagnosis of  By: Lenna Gilford MD, Deborra Medina   . NECK PAIN 08/07/2007  . Colon polyp    Past Surgical History  Procedure Laterality Date  . Tonsillectomy and adenoidectomy    . Appendectomy    . Abdominal hysterectomy    . Knee surgery    . Breast cyst excision    . Blocked intestine surgery    . Knee arthroscopy      X2 L KNEE  . Joint replacement  2004    RT TOTAL KNEE  . Nasal surgery x2     . Hemhorroidectomy    . Total knee arthroplasty  09/03/2011    Procedure: TOTAL KNEE ARTHROPLASTY;  Surgeon: Gearlean Alf, MD;  Location: [facility];  Service: Orthopedics;  Laterality: Left;   History   Social History  . Marital Status: Single    Spouse Name: N/A  . Number of Children: 3  . Years of Education: N/A   Occupational History  . Retired    Social History Main Topics  . Smoking status: Never Smoker   . Smokeless tobacco: Never Used  . Alcohol Use: 0.0 oz/week    0 Standard drinks or equivalent per week     Comment: Rarely  . Drug Use: No  . Sexual Activity: Not Currently   Other Topics Concern  . None   Social History Narrative   Family History  Problem Relation Age of Onset  . Coronary artery disease Father   . Cirrhosis Brother   . Stroke Father   . Colon cancer Son   . Colon polyps Neg Hx   . Kidney disease Neg Hx   . Diabetes Neg Hx       Review of Systems: Pertinent positive and negative review of systems were noted in the above HPI section. All other review of systems were otherwise negative.   Physical Exam: General: Well developed, well nourished, no acute distress Head: Normocephalic and atraumatic Eyes:  sclerae anicteric, EOMI Ears: Normal auditory acuity Mouth: No deformity or lesions Neck: Supple, no masses or thyromegaly Lungs: Clear throughout to auscultation Heart: Regular rate and rhythm; no murmurs, rubs or bruits Abdomen: Soft, non tender and non distended. No masses, hepatosplenomegaly or hernias noted. Normal Bowel sounds Rectal: decreased sphincter tone, no lesions, heme - stool Musculoskeletal: Symmetrical with no gross deformities  Skin: No lesions on visible extremities Pulses:  Normal pulses noted Extremities: No clubbing, cyanosis, edema or deformities noted Neurological: Alert oriented x 4, grossly nonfocal Cervical Nodes:  No significant cervical adenopathy Inguinal Nodes: No significant inguinal  adenopathy Psychological:  Alert and cooperative. Normal mood and affect  Assessment and Recommendations:  1. Rectal prolapse, mild. We discussed the possibility of flexible sigmoidoscopy, BE or colonoscopy to evaluate for rectal and sigmoid colon disorders that may be predisposing her to mild prolapse however her risk of anesthesia and procedures are elevated due to her history of cardiac complications during  knee replacement surgery. She does not want to proceed with further evaluation and I think this is very reasonable. She has no significant symptoms from her mild rectal prolapse and this can be followed by her PCP. I will see back on referral.  2. Minimal left lower quadrant pain relief with bowel movements. She is reassured. Increase daily fiber and fluid intake.  3. History of pulmonary embolism maintained on Pradaxa.  cc: Olga Millers, MD [address] [place], [place] [address]

## 2014-08-14 NOTE — Patient Instructions (Signed)
Follow up with GI as needed.   cc: Franchot Gallo, MD        Vertell Novak, MD

## 2014-08-23 DIAGNOSIS — N951 Menopausal and female climacteric states: Secondary | ICD-10-CM | POA: Diagnosis not present

## 2014-08-23 DIAGNOSIS — N8189 Other female genital prolapse: Secondary | ICD-10-CM | POA: Diagnosis not present

## 2014-08-23 DIAGNOSIS — Z1231 Encounter for screening mammogram for malignant neoplasm of breast: Secondary | ICD-10-CM | POA: Diagnosis not present

## 2014-08-23 DIAGNOSIS — Z01419 Encounter for gynecological examination (general) (routine) without abnormal findings: Secondary | ICD-10-CM | POA: Diagnosis not present

## 2014-08-26 ENCOUNTER — Other Ambulatory Visit: Payer: Self-pay | Admitting: Neurology

## 2014-08-27 ENCOUNTER — Other Ambulatory Visit: Payer: Self-pay | Admitting: *Deleted

## 2014-08-27 DIAGNOSIS — M5481 Occipital neuralgia: Secondary | ICD-10-CM

## 2014-08-27 MED ORDER — GABAPENTIN 100 MG PO CAPS: ORAL_CAPSULE | ORAL | Status: DC

## 2014-08-28 DIAGNOSIS — N816 Rectocele: Secondary | ICD-10-CM | POA: Diagnosis not present

## 2014-08-28 DIAGNOSIS — R3989 Other symptoms and signs involving the genitourinary system: Secondary | ICD-10-CM | POA: Diagnosis not present

## 2014-08-28 DIAGNOSIS — R351 Nocturia: Secondary | ICD-10-CM | POA: Diagnosis not present

## 2014-08-28 DIAGNOSIS — R350 Frequency of micturition: Secondary | ICD-10-CM | POA: Diagnosis not present

## 2014-08-28 DIAGNOSIS — N3941 Urge incontinence: Secondary | ICD-10-CM | POA: Diagnosis not present

## 2014-09-17 DIAGNOSIS — J309 Allergic rhinitis, unspecified: Secondary | ICD-10-CM | POA: Diagnosis not present

## 2014-10-02 ENCOUNTER — Ambulatory Visit (INDEPENDENT_AMBULATORY_CARE_PROVIDER_SITE_OTHER): Payer: Medicare Other | Admitting: Internal Medicine

## 2014-10-02 ENCOUNTER — Encounter: Payer: Self-pay | Admitting: Internal Medicine

## 2014-10-02 VITALS — BP 142/88 | HR 81 | Ht 65.5 in | Wt 185.6 lb

## 2014-10-02 DIAGNOSIS — I482 Chronic atrial fibrillation, unspecified: Secondary | ICD-10-CM

## 2014-10-02 LAB — COMPREHENSIVE METABOLIC PANEL
ALT: 13 U/L (ref 0–35)
AST: 16 U/L (ref 0–37)
Albumin: 4.3 g/dL (ref 3.5–5.2)
Alkaline Phosphatase: 42 U/L (ref 39–117)
BUN: 12 mg/dL (ref 6–23)
CO2: 27 mEq/L (ref 19–32)
Calcium: 9.6 mg/dL (ref 8.4–10.5)
Chloride: 104 mEq/L (ref 96–112)
Creatinine, Ser: 0.59 mg/dL (ref 0.40–1.20)
GFR: 102.36 mL/min (ref >60.00)
Glucose, Bld: 90 mg/dL (ref 70–99)
Potassium: 3.7 mEq/L (ref 3.5–5.1)
Sodium: 138 mEq/L (ref 135–145)
Total Bilirubin: 0.7 mg/dL (ref 0.2–1.2)
Total Protein: 7.3 g/dL (ref 6.0–8.3)

## 2014-10-02 LAB — CBC WITH DIFFERENTIAL/PLATELET
Basophils Absolute: 0 10*3/uL (ref 0.0–0.1)
Basophils Relative: 0.7 % (ref 0.0–3.0)
Eosinophils Absolute: 0.2 10*3/uL (ref 0.0–0.7)
Eosinophils Relative: 2.6 % (ref 0.0–5.0)
HCT: 37.5 % (ref 36.0–46.0)
Hemoglobin: 12.8 g/dL (ref 12.0–15.0)
Lymphocytes Relative: 31.8 % (ref 12.0–46.0)
Lymphs Abs: 2.1 10*3/uL (ref 0.7–4.0)
MCHC: 34.2 g/dL (ref 30.0–36.0)
MCV: 88.3 fl (ref 78.0–100.0)
Monocytes Absolute: 0.7 10*3/uL (ref 0.1–1.0)
Monocytes Relative: 9.9 % (ref 3.0–12.0)
Neutro Abs: 3.7 10*3/uL (ref 1.4–7.7)
Neutrophils Relative %: 55 % (ref 43.0–77.0)
Platelets: 213 10*3/uL (ref 150.0–400.0)
RBC: 4.24 Mil/uL (ref 3.87–5.11)
RDW: 13.6 % (ref 11.5–15.5)
WBC: 6.7 10*3/uL (ref 4.0–10.5)

## 2014-10-02 LAB — TSH: TSH: 1.99 u[IU]/mL (ref 0.35–4.50)

## 2014-10-02 NOTE — Patient Instructions (Signed)
Medication Instructions:  Your physician has recommended you make the following change in your medication:  1) STOP Diltiazem  Labwork: TSH, CBCD, & CMET today  (once we receive the results of the lab work we will determine dosing for Eliquis)  Testing/Procedures: None ordered  Follow-Up: Your physician recommends that you schedule a follow-up appointment in: 3 weeks with Roderic Palau, NP  Thank you for choosing [facility]!!

## 2014-10-02 NOTE — Progress Notes (Signed)
Samantha Bishop      Patient Care Team: Olga Millers, MD as PCP - General (Internal Medicine)   HPI  Samantha Bishop is a 81 y.o. female seen in followup for atrial fibrillation associated with symptomatic palpitations. She is a CHADS VASC score of 4 and is on Pradaxa. She  has hypertension.  She has alopecia which is precluded the use of beta blockers.  Echocardiogram 2010 demonstrated normal left ventricular function. She was last seen in our office about one year ago prior to knee surgery.  She feels terrible. She wonders whether it is related to medications. She also has suffered a prolapsed uterus and bowel. She is concerned about surgery.  She has significant abdominal discomfort when she takes her Pradaxa.  There is no significant issues with shortness of breath or chest pain.        Past Medical History  Diagnosis Date  . HTN (hypertension)   . Atrial fibrillation     managed with rate control and anticoagulation.   . Tachycardia, unspecified   . Pulmonary emboli     following remote knee arthroscopy surgery  . Hyperlipidemia   . Diverticulosis of colon (without mention of hemorrhage)   . Unspecified hemorrhoids without mention of complication   . Calculus of kidney   . Osteoarthrosis, unspecified whether generalized or localized, unspecified site   . Lumbago   . Cerebral aneurysm, nonruptured   . Anxiety state, unspecified   . Persistent disorder of initiating or maintaining sleep   . Chronic anticoagulation     on Pradaxa  . Atrial tachycardia     in the remote past with remote ablation  . UTI (urinary tract infection)     CURRENTLY BEING TX FOR UTI  . Esophageal reflux     YRS AGO - NO PROBLEM NOW  . History of skin cancer   . Ruptured lumbar disc   . Complication of anesthesia     "my heart stopped" 2004 knee  surg - see note on chart  . Sleep apnea     stop bang score 4   . DEEP VENOUS THROMBOPHLEBITIS 08/15/2007    Qualifier: History of  By: Lenna Gilford MD,  [place] WITHOUT COMPS 09/04/2009    Qualifier: Diagnosis of  By: Ola Spurr MD, [name]    . OA (osteoarthritis) of knee 09/03/2011  . OSTEOPENIA 02/21/2010    Qualifier: Diagnosis of  By: Lenna Gilford MD, Deborra Medina   . NECK PAIN 08/07/2007  . Colon polyp     Past Surgical History  Procedure Laterality Date  . Tonsillectomy and adenoidectomy    . Appendectomy    . Abdominal hysterectomy    . Knee surgery    . Breast cyst excision    . Blocked intestine surgery    . Knee arthroscopy      X2 L KNEE  . Joint replacement  2004    RT TOTAL KNEE  . Nasal surgery x2    . Hemhorroidectomy    . Total knee arthroplasty  09/03/2011    Procedure: TOTAL KNEE ARTHROPLASTY;  Surgeon: Gearlean Alf, MD;  Location: [facility];  Service: Orthopedics;  Laterality: Left;    Current Outpatient Prescriptions  Medication Sig Dispense Refill  . diltiazem (CARDIZEM CD) 120 MG 24 hr capsule TAKE 1 CAPSULE (120 MG TOTAL) BY MOUTH DAILY. 90 capsule 1  . gabapentin (NEURONTIN) 100 MG capsule Take 200 mg by mouth 2 (two) times daily.    Marland Kitchen  hydrochlorothiazide (HYDRODIURIL) 25 MG tablet TAKE 1/2 TABLET BY MOUTH ONCE DAILY. 45 tablet 1  . montelukast (SINGULAIR) 10 MG tablet Take 10 mg by mouth at bedtime.     Marland Kitchen PRADAXA 150 MG CAPS capsule TAKE ONE CAPSULE BY MOUTH EVERY 12 HOURS 180 capsule 1  . simvastatin (ZOCOR) 20 MG tablet Take 20 mg by mouth daily.     No current facility-administered medications for this visit.    Allergies  Allergen Reactions  . Codeine     REACTION: causes nausea  . Penicillins     REACTION: rash    Review of Systems negative except from HPI and PMH  Physical Exam BP 142/88 mmHg  Pulse 81  Ht 5' 5.5" (1.664 m)  Wt 185 lb 9.6 oz (84.188 kg)  BMI 30.40 kg/m2 Well developed and well nourished in no acute distress HENT normal E scleral and icterus clear Neck Supple JVP flat; carotids brisk and full Clear to ausculation  Irregular rate and rhythm, no  murmurs gallops or rub Soft with active bowel sounds No clubbing cyanosis Trace Edema Alert and oriented, grossly normal motor and sensory function Skin Warm and Dry  ECG  Atrial Fibrillation controlled ventricular response  Assessment and  Plan  Atrial fibrillation  Sinusitis/bronchitis  Hypertension  Fatigue  Abdominal pain with Pradaxa  Blood pressure is modestly elevated.. Atrial fibrillation has adequate heart rate control.   Renal function was normal 2/15. She is having significant abdominal discomfort when she takes her dabigitran. We will also check her renal function and then switch her from dabigitran to [place]  Overall she feels terrible. I wonder whether it's one of her medication. We will exclude her diltiazem and her simvastatin.  We will have her come in in 3 weeks for a blood pressure and heart rate check. She may well need augmented medication I would anticipate the use of verapamil  She also mentioned that she is having problems with a prolapsed uterus and a prolapsed valve. She apparently had some kind of cardiac complication with knee replacement surgery a decade ago. I suggested that she follow-up with Dr. Diona Fanti and he could recommend an anesthesia consultation to see if there was an anesthetic issue as I don't see any particular cardiac reaso that she could not have surgery   We will plan to check her metabolic profile given her use of diuretics and her CBC given her anticoagulated. We will check her thyroid status

## 2014-10-11 ENCOUNTER — Telehealth: Payer: Self-pay | Admitting: Internal Medicine

## 2014-10-11 NOTE — Telephone Encounter (Signed)
New message     Patient calling wants to know why she has to go to AFIB clinic

## 2014-10-11 NOTE — Telephone Encounter (Signed)
Reviewed with patient why she is going to the AFib clinic. She was asking how to get there - I informed her that I will have someone call her next week with directions/instructions. She is agreeable to plan.

## 2014-10-14 DIAGNOSIS — Z960 Presence of urogenital implants: Secondary | ICD-10-CM | POA: Diagnosis not present

## 2014-10-17 ENCOUNTER — Ambulatory Visit (INDEPENDENT_AMBULATORY_CARE_PROVIDER_SITE_OTHER): Payer: Medicare Other | Admitting: Neurology

## 2014-10-17 ENCOUNTER — Encounter: Payer: Self-pay | Admitting: Neurology

## 2014-10-17 VITALS — BP 110/80 | HR 81 | Resp 16 | Ht 66.5 in | Wt 182.0 lb

## 2014-10-17 DIAGNOSIS — M5481 Occipital neuralgia: Secondary | ICD-10-CM | POA: Diagnosis not present

## 2014-10-17 DIAGNOSIS — I671 Cerebral aneurysm, nonruptured: Secondary | ICD-10-CM

## 2014-10-17 MED ORDER — GABAPENTIN 100 MG PO CAPS: ORAL_CAPSULE | ORAL | Status: DC

## 2014-10-17 NOTE — Progress Notes (Signed)
NEUROLOGY FOLLOW UP OFFICE NOTE  Samantha Bishop [id number]  HISTORY OF PRESENT ILLNESS: I had the pleasure of seeing Samantha Bishop in follow-up in the neurology clinic on 10/17/2014.  The patient was last seen more than a year ago for follow-up on small left cavernous carotid aneurysm and fusiform dilatation of the right A3 segment. I personally reviewed interval CTA head done 07/2013 which showed unchanged 34mm left supraclinoid IA aneurysm, unchanged slight fuisform dilatation of the right ACA. She has a history of occipital neuralgia with good control on gabapentin, increased in the ER after she had exacerbation last March 2016. She is currently on gabapentin 300mg  in AM, 400mg  in PM. She does notice slight drowsiness in the daytime. Otherwise she denies any dizziness, diplopia, dysarthria, dysphagia, neck/back pain, focal numbness/tingling/weakness. She has unfortunately been dealing with bladder prolapse but cannot undergo surgery due to adverse effects from general anesthesia in the past.   Diagnostic Data: CTA head in 2012 showed hypodensities in the bilateral white matter, with chronic infarct in the right parietal cortex. Both vertebral arteries are patent to the basilar. Mild atherosclerotic calcified plaque of the distal right vertebral artery. PICA is patent bilaterally. The superior cerebellar and posterior cerebral arteries are patent bilaterally. Fetal origin of the right posterior cerebral artery. Atherosclerotic calcification is present in the cavernous carotid bilaterally. 4 x 5 mm aneurysm in the left cavernous carotid is unchanged. This projects inferiorly from the supraclinoid segment of the internal carotid artery on the left. No significant carotid stenosis. Anterior and middle cerebral arteries are patent bilaterally without significant stenosis. Mild fusiform dilatation of the right A3 segment is again noted and unchanged. This is near a branch point. This could be a small  aneurysm or atherosclerotic fusiform dilatation. No significant stenosis in the area. No other aneurysms. Unchanged CTA 07/2013  PAST MEDICAL HISTORY: Past Medical History  Diagnosis Date  . HTN (hypertension)   . Atrial fibrillation     managed with rate control and anticoagulation.   . Tachycardia, unspecified   . Pulmonary emboli     following remote knee arthroscopy surgery  . Hyperlipidemia   . Diverticulosis of colon (without mention of hemorrhage)   . Unspecified hemorrhoids without mention of complication   . Calculus of kidney   . Osteoarthrosis, unspecified whether generalized or localized, unspecified site   . Lumbago   . Cerebral aneurysm, nonruptured   . Anxiety state, unspecified   . Persistent disorder of initiating or maintaining sleep   . Chronic anticoagulation     on Pradaxa  . Atrial tachycardia     in the remote past with remote ablation  . UTI (urinary tract infection)     CURRENTLY BEING TX FOR UTI  . Esophageal reflux     YRS AGO - NO PROBLEM NOW  . History of skin cancer   . Ruptured lumbar disc   . Complication of anesthesia     "my heart stopped" 2004 knee  surg - see note on chart  . Sleep apnea     stop bang score 4   . DEEP VENOUS THROMBOPHLEBITIS 08/15/2007    Qualifier: History of  By: Lenna Gilford MD, [place] WITHOUT COMPS 09/04/2009    Qualifier: Diagnosis of  By: Ola Spurr MD, [name]    . OA (osteoarthritis) of knee 09/03/2011  . OSTEOPENIA 02/21/2010    Qualifier: Diagnosis of  By: Lenna Gilford MD, Deborra Medina   . NECK PAIN 08/07/2007  .  Colon polyp     MEDICATIONS: Current Outpatient Prescriptions on File Prior to Visit  Medication Sig Dispense Refill  . gabapentin (NEURONTIN) 100 MG capsule Takes 3 capsules in the morning & 4 capsules at night    . hydrochlorothiazide (HYDRODIURIL) 25 MG tablet TAKE 1/2 TABLET BY MOUTH ONCE DAILY. 45 tablet 1  . montelukast (SINGULAIR) 10 MG tablet Take 10 mg by mouth as needed.     Marland Kitchen  PRADAXA 150 MG CAPS capsule TAKE ONE CAPSULE BY MOUTH EVERY 12 HOURS 180 capsule 1   No current facility-administered medications on file prior to visit.    ALLERGIES: Allergies  Allergen Reactions  . Codeine     REACTION: causes nausea  . Penicillins     REACTION: rash    FAMILY HISTORY: Family History  Problem Relation Age of Onset  . Coronary artery disease Father   . Cirrhosis Brother   . Stroke Father   . Colon cancer Son   . Colon polyps Neg Hx   . Kidney disease Neg Hx   . Diabetes Neg Hx     SOCIAL HISTORY: History   Social History  . Marital Status: Single    Spouse Name: N/A  . Number of Children: 3  . Years of Education: N/A   Occupational History  . Retired    Social History Main Topics  . Smoking status: Never Smoker   . Smokeless tobacco: Never Used  . Alcohol Use: 0.0 oz/week    0 Standard drinks or equivalent per week     Comment: Rarely  . Drug Use: No  . Sexual Activity: Not Currently   Other Topics Concern  . Not on file   Social History Narrative    REVIEW OF SYSTEMS: Constitutional: No fevers, chills, or sweats, no generalized fatigue, change in appetite Eyes: No visual changes, double vision, eye pain Ear, nose and throat: No hearing loss, ear pain, nasal congestion, sore throat Cardiovascular: No chest pain, palpitations Respiratory:  No shortness of breath at rest or with exertion, wheezes GastrointestinaI: No nausea, vomiting, diarrhea, abdominal pain, fecal incontinence Genitourinary:  No dysuria, urinary retention or frequency Musculoskeletal:  No neck pain, back pain Integumentary: No rash, pruritus, skin lesions Neurological: as above Psychiatric: No depression, insomnia, anxiety Endocrine: No palpitations, fatigue, diaphoresis, mood swings, change in appetite, change in weight, increased thirst Hematologic/Lymphatic:  No anemia, purpura, petechiae. Allergic/Immunologic: no itchy/runny eyes, nasal congestion, recent  allergic reactions, rashes  PHYSICAL EXAM: Filed Vitals:   10/17/14 1038  BP: 110/80  Pulse: 81  Resp: 16   General: No acute distress Head:  Normocephalic/atraumatic Neck: supple, no paraspinal tenderness, full range of motion Heart:  Regular rate and rhythm Lungs:  Clear to auscultation bilaterally Back: No paraspinal tenderness Skin/Extremities: No rash, no edema Neurological Exam: alert and oriented to person, place, and time. No aphasia or dysarthria. Fund of knowledge is appropriate.  Recent and remote memory are intact.  Attention and concentration are normal.    Able to name objects and repeat phrases. Cranial nerves: Pupils equal, round, reactive to light.  Fundoscopic exam unremarkable, no papilledema. Extraocular movements intact with no nystagmus. Visual fields full. Facial sensation intact. No facial asymmetry. Tongue, uvula, palate midline.  Motor: Bulk and tone normal, muscle strength 5/5 throughout with no pronator drift.  Sensation to light touch intact.  No extinction to double simultaneous stimulation.  Deep tendon reflexes 1+ throughout except for absent ankle jerks bilaterally, toes downgoing.  Finger to nose testing intact.  Gait narrow-based and steady, able to tandem walk adequately.  Romberg negative.  IMPRESSION: This is a very pleasant 81 yo RH woman with a history of hypertension, atrial fibrillation on anticoagulation, right parietal stroke, occipital neuralgia, and left cavernous carotid aneurysm, stable on follow-up CTA in 2015. Typically re-imaging can be done every 5 years, unless symptoms change.She had a recent exacerbation of occipital neuralgia, and is now on gabapentin 300, 400mg  but feels drowsy. No pain in the past 3 months. She will try to reduce morning dose to 200mg  in AM, 400mg  in PM and monitor sedation and if pain recurs. She is aware of low annual risk of rupture in aneurysms less than 105mm, interval scans for follow-up, and control of hypertension.  She is on chronic anticoagulation for atrial fibrillation and knows to go to the ER for any signs of severe headache or stroke-like symptoms. She will follow-up in 1 year or earlier if needed.   Thank you for allowing me to participate in her care.  Please do not hesitate to call for any questions or concerns.  The duration of this appointment visit was 15 minutes of face-to-face time with the patient.  Greater than 50% of this time was spent in counseling, explanation of diagnosis, planning of further management, and coordination of care.   Samantha Bishop, M.D.   CC: Dr. Doug Sou

## 2014-10-17 NOTE — Patient Instructions (Signed)
1. Try reducing Gabapentin 100mg : take 2 caps in AM, 4 caps in PM and monitor if daytime drowsiness improves or headaches increase 2. Follow-up in 1 year, call our office for any changes

## 2014-10-18 ENCOUNTER — Telehealth: Payer: Self-pay

## 2014-10-18 DIAGNOSIS — I671 Cerebral aneurysm, nonruptured: Secondary | ICD-10-CM | POA: Insufficient documentation

## 2014-10-18 MED ORDER — DABIGATRAN ETEXILATE MESYLATE 150 MG PO CAPS: ORAL_CAPSULE | ORAL | Status: DC

## 2014-10-24 NOTE — Telephone Encounter (Signed)
Samantha Bishop   Sent: Fri October 18, 2014 10:23 AM    To: Deboraha Sprang, MD    Cc: Stanton Kidney, RN        Message     ----- Message from Samantha Bishop sent at 10/18/2014 10:23 AM EDT -----    Patient called stating that she wanted to stay on pradaxa and I sent in a refill for her

## 2014-10-25 ENCOUNTER — Ambulatory Visit (HOSPITAL_COMMUNITY): Payer: Medicare Other | Admitting: Nurse Practitioner

## 2014-11-01 ENCOUNTER — Other Ambulatory Visit: Payer: Self-pay | Admitting: Internal Medicine

## 2014-11-21 ENCOUNTER — Telehealth: Payer: Self-pay | Admitting: Internal Medicine

## 2014-11-21 DIAGNOSIS — K623 Rectal prolapse: Secondary | ICD-10-CM

## 2014-11-21 NOTE — Telephone Encounter (Signed)
Referral placed.

## 2014-11-21 NOTE — Telephone Encounter (Signed)
Patient is still having some serious stomach issues. You've seen her about this before and she was sent upstairs to GI. She claimed they said they couldn't do anything for her.  Many people have recommended a Dr. Brantley Stage to her. His number is [phone].  She does not want to come for a visit since she has seen her for this before. She's hoping to get referred.

## 2014-12-11 ENCOUNTER — Ambulatory Visit (HOSPITAL_COMMUNITY)
Admission: RE | Admit: 2014-12-11 | Discharge: 2014-12-11 | Disposition: A | Discharge status: Home / Self Care | Payer: Medicare Other | Source: Ambulatory Visit | Attending: Nurse Practitioner | Admitting: Nurse Practitioner

## 2014-12-11 ENCOUNTER — Encounter (HOSPITAL_COMMUNITY): Payer: Self-pay | Admitting: Nurse Practitioner

## 2014-12-11 VITALS — BP 138/80 | HR 86 | Ht 65.0 in | Wt 181.2 lb

## 2014-12-11 DIAGNOSIS — I482 Chronic atrial fibrillation, unspecified: Secondary | ICD-10-CM

## 2014-12-11 DIAGNOSIS — I10 Essential (primary) hypertension: Secondary | ICD-10-CM | POA: Insufficient documentation

## 2014-12-11 NOTE — Progress Notes (Signed)
Patient ID: Samantha Bishop, female   DOB: Sep 17, 1924, 81 y.o.   MRN: [id number]     Primary Care Physician: Olga Millers, MD Referring Physician:Dr. Arminda Foglio is a 81 y.o. female with a h/o chronic afib that was seen by Dr. Caryl Comes the end of May and was c/o of feeling bad. Diltiazem and statin were discontinued. She was suppose to come back  in 3 weeks for a recheck, but her appointment was delayed so she in following up in the afib clinic today. She is feeling so much better since those two drugs were stopped. Afib with controlled v. rates today, off all rate control, by EKG and BP is acceptable. She is having some ongoing lower abdominal discomfort secondary to uterine and bladder prolapse. No upper GI concerns with Pradaxa. She is taking on a regular basis.  Today, she denies symptoms of palpitations, chest pain, shortness of breath, orthopnea, PND, lower extremity edema, dizziness, presyncope, syncope, or neurologic sequela. The patient is tolerating medications without difficulties and is otherwise without complaint today.   Past Medical History  Diagnosis Date  . HTN (hypertension)   . Atrial fibrillation     managed with rate control and anticoagulation.   . Tachycardia, unspecified   . Pulmonary emboli     following remote knee arthroscopy surgery  . Hyperlipidemia   . Diverticulosis of colon (without mention of hemorrhage)   . Unspecified hemorrhoids without mention of complication   . Calculus of kidney   . Osteoarthrosis, unspecified whether generalized or localized, unspecified site   . Lumbago   . Cerebral aneurysm, nonruptured   . Anxiety state, unspecified   . Persistent disorder of initiating or maintaining sleep   . Chronic anticoagulation     on Pradaxa  . Atrial tachycardia     in the remote past with remote ablation  . UTI (urinary tract infection)     CURRENTLY BEING TX FOR UTI  . Esophageal reflux     YRS AGO - NO PROBLEM NOW  . History  of skin cancer   . Ruptured lumbar disc   . Complication of anesthesia     "my heart stopped" 2004 knee  surg - see note on chart  . Sleep apnea     stop bang score 4   . DEEP VENOUS THROMBOPHLEBITIS 08/15/2007    Qualifier: History of  By: Lenna Gilford MD, [place] WITHOUT COMPS 09/04/2009    Qualifier: Diagnosis of  By: Ola Spurr MD, [name]    . OA (osteoarthritis) of knee 09/03/2011  . OSTEOPENIA 02/21/2010    Qualifier: Diagnosis of  By: Lenna Gilford MD, Deborra Medina   . NECK PAIN 08/07/2007  . Colon polyp    Past Surgical History  Procedure Laterality Date  . Tonsillectomy and adenoidectomy    . Appendectomy    . Abdominal hysterectomy    . Knee surgery    . Breast cyst excision    . Blocked intestine surgery    . Knee arthroscopy      X2 L KNEE  . Joint replacement  2004    RT TOTAL KNEE  . Nasal surgery x2    . Hemhorroidectomy    . Total knee arthroplasty  09/03/2011    Procedure: TOTAL KNEE ARTHROPLASTY;  Surgeon: Gearlean Alf, MD;  Location: [facility];  Service: Orthopedics;  Laterality: Left;    Current Outpatient Prescriptions  Medication Sig Dispense Refill  . dabigatran (  PRADAXA) 150 MG CAPS capsule TAKE ONE CAPSULE BY MOUTH EVERY 12 HOURS 180 capsule 1  . gabapentin (NEURONTIN) 100 MG capsule Takes 3 capsules in the morning & 4 capsules at night 630 capsule 3  . hydrochlorothiazide (HYDRODIURIL) 25 MG tablet TAKE 1/2 TABLET BY MOUTH ONCE DAILY. 45 tablet 1  . montelukast (SINGULAIR) 10 MG tablet Take 10 mg by mouth as needed.      No current facility-administered medications for this encounter.    Allergies  Allergen Reactions  . Codeine     REACTION: causes nausea  . Penicillins     REACTION: rash    History   Social History  . Marital Status: Single    Spouse Name: N/A  . Number of Children: 3  . Years of Education: N/A   Occupational History  . Retired    Social History Main Topics  . Smoking status: Never Smoker   . Smokeless  tobacco: Never Used  . Alcohol Use: 0.0 oz/week    0 Standard drinks or equivalent per week     Comment: Rarely  . Drug Use: No  . Sexual Activity: Not Currently   Other Topics Concern  . Not on file   Social History Narrative    Family History  Problem Relation Age of Onset  . Coronary artery disease Father   . Cirrhosis Brother   . Stroke Father   . Colon cancer Son   . Colon polyps Neg Hx   . Kidney disease Neg Hx   . Diabetes Neg Hx     ROS- All systems are reviewed and negative except as per the HPI above  Physical Exam: Filed Vitals:   12/11/14 1446 12/11/14 1704  BP: 150/84 138/80  Pulse: 86   Height: 5\' 5"  (1.651 m)   Weight: 181 lb 3.2 oz (82.192 kg)     GEN- The patient is well appearing, alert and oriented x 3 today.   Head- normocephalic, atraumatic Eyes-  Sclera clear, conjunctiva pink Ears- hearing intact Oropharynx- clear Neck- supple, no JVP Lymph- no cervical lymphadenopathy Lungs- Clear to ausculation bilaterally, normal work of breathing Heart- Regular rate and rhythm, no murmurs, rubs or gallops, PMI not laterally displaced GI- soft, NT, ND, + BS Extremities- no clubbing, cyanosis, or edema MS- no significant deformity or atrophy Skin- no rash or lesion Psych- euthymic mood, full affect Neuro- strength and sensation are intact  EKG-afib with v rate of 86 bpm Epic records reviewed.  Assessment and Plan: 1. Longstanding persistent afib/asymptomatic Rate controlled with out chronotropic agents Continue Pradaxa  2. HTN Rechecked at 138/80 Continue HCTZ  F/u with Dr. Caryl Comes as scheduled Afib clinic as needed.  Geroge Baseman Nalu [place], [facility] [address] [place], [place] [address] [phone]

## 2014-12-14 ENCOUNTER — Other Ambulatory Visit: Payer: Self-pay | Admitting: Internal Medicine

## 2015-01-02 ENCOUNTER — Encounter: Payer: Self-pay | Admitting: Internal Medicine

## 2015-01-02 ENCOUNTER — Ambulatory Visit (INDEPENDENT_AMBULATORY_CARE_PROVIDER_SITE_OTHER): Payer: Medicare Other | Admitting: Internal Medicine

## 2015-01-02 VITALS — BP 130/80 | HR 85 | Temp 98.3°F | Resp 16

## 2015-01-02 DIAGNOSIS — R1032 Left lower quadrant pain: Secondary | ICD-10-CM | POA: Diagnosis not present

## 2015-01-02 DIAGNOSIS — K623 Rectal prolapse: Secondary | ICD-10-CM

## 2015-01-02 DIAGNOSIS — K5900 Constipation, unspecified: Secondary | ICD-10-CM

## 2015-01-02 MED ORDER — SENNA-DOCUSATE SODIUM 8.6-50 MG PO TABS
1.0000 | ORAL_TABLET | Freq: Two times a day (BID) | ORAL | Status: DC
Start: 2015-01-02 — End: 2015-06-24

## 2015-01-02 NOTE — Patient Instructions (Signed)
We are going to get the imaging test of the stomach to check on that pain.   We have also sent in a medicine called senokot-d which has 2 medicines in it. One helps the intestines to move better and the other helps to make the stools easier to pass and softer.

## 2015-01-02 NOTE — Progress Notes (Signed)
Pre visit review using our clinic review tool, if applicable. No additional management support is needed unless otherwise documented below in the visit note. 

## 2015-01-02 NOTE — Assessment & Plan Note (Signed)
Patient denies this diagnosis and wishes it removed from her record. Will resolve.

## 2015-01-02 NOTE — Assessment & Plan Note (Signed)
Rx for senokot-d to help with her bowels, will order CT without contrast for the LLQ pain. Concern for diverticulosis with the blood in the stools and recent and remote constipation. No clinical signs of infection or diverticulitis today. She has previously not wanted evaluation with sigmoidoscopy due to her health conditions.

## 2015-01-02 NOTE — Progress Notes (Signed)
   Subjective:    Patient ID: Samantha Bishop, female    DOB: 1924/05/16, 81 y.o.   MRN: [id number]  HPI The patient is an 81 YO female coming in with constipation, hard stools and LLQ pain. She previously had stable LLQ pain in the morning which resolves with defecation. This is worse lately and she is straining significantly to move her bowels. Still moving daily. Hard stools and some blood with wiping one day back in July. Has been disappointed with lack of evaluation of her symptoms (review of GI note suggests that she did not want work up with sigmoidoscopy at that time). Passing gas. No nausea or vomiting.   Review of Systems  Constitutional: Negative.   Eyes: Negative.   Respiratory: Negative.   Cardiovascular: Negative.   Gastrointestinal: Positive for abdominal pain, constipation, blood in stool, anal bleeding and rectal pain. Negative for nausea, vomiting, diarrhea and abdominal distention.  Genitourinary: Negative for dysuria, frequency, flank pain and difficulty urinating.      Objective:   Physical Exam  Constitutional: She is oriented to person, place, and time. She appears well-developed and well-nourished. No distress.  HENT:  Head: Normocephalic and atraumatic.  Eyes: EOM are normal.  Neck: Normal range of motion.  Cardiovascular: Normal rate.   Pulmonary/Chest: Effort normal and breath sounds normal.  Abdominal: Soft. Bowel sounds are normal. She exhibits no distension. There is no tenderness. There is no rebound.  No tenderness on exam today.  Neurological: She is alert and oriented to person, place, and time. Coordination normal.  Skin: Skin is warm and dry.   Filed Vitals:   01/02/15 1340 01/02/15 1405  BP: 160/98 130/80  Pulse: 85   Temp: 98.3 F (36.8 C)   TempSrc: Oral   Resp: 16   SpO2: 96%       Assessment & Plan:

## 2015-01-10 ENCOUNTER — Ambulatory Visit (INDEPENDENT_AMBULATORY_CARE_PROVIDER_SITE_OTHER)
Admission: RE | Admit: 2015-01-10 | Discharge: 2015-01-10 | Disposition: A | Discharge status: Home / Self Care | Payer: Medicare Other | Source: Ambulatory Visit | Attending: Internal Medicine | Admitting: Internal Medicine

## 2015-01-10 DIAGNOSIS — K5730 Diverticulosis of large intestine without perforation or abscess without bleeding: Secondary | ICD-10-CM | POA: Diagnosis not present

## 2015-01-10 DIAGNOSIS — R1032 Left lower quadrant pain: Secondary | ICD-10-CM

## 2015-01-11 ENCOUNTER — Other Ambulatory Visit: Payer: Self-pay | Admitting: Internal Medicine

## 2015-01-15 DIAGNOSIS — Z79890 Hormone replacement therapy (postmenopausal): Secondary | ICD-10-CM | POA: Diagnosis not present

## 2015-01-15 DIAGNOSIS — Z780 Asymptomatic menopausal state: Secondary | ICD-10-CM | POA: Diagnosis not present

## 2015-01-15 DIAGNOSIS — N8189 Other female genital prolapse: Secondary | ICD-10-CM | POA: Diagnosis not present

## 2015-01-16 ENCOUNTER — Telehealth: Payer: Self-pay | Admitting: Internal Medicine

## 2015-01-16 NOTE — Telephone Encounter (Signed)
Pt called in and would to know if nurse could give her a call about her ct scan

## 2015-01-17 NOTE — Telephone Encounter (Signed)
Spoke with patient. She is going to call GI and schedule the appointment to go talk about having the endoscopy.

## 2015-01-24 ENCOUNTER — Other Ambulatory Visit: Payer: Self-pay | Admitting: Geriatric Medicine

## 2015-01-24 MED ORDER — SENNA-DOCUSATE SODIUM 8.6-50 MG PO TABS: 1.0000 | ORAL_TABLET | Freq: Two times a day (BID) | ORAL | Status: DC

## 2015-02-06 ENCOUNTER — Other Ambulatory Visit: Payer: Self-pay | Admitting: Allergy and Immunology

## 2015-02-07 ENCOUNTER — Other Ambulatory Visit: Payer: Self-pay | Admitting: Allergy and Immunology

## 2015-02-07 ENCOUNTER — Telehealth: Payer: Self-pay | Admitting: Allergy and Immunology

## 2015-02-07 NOTE — Telephone Encounter (Signed)
Please inform patient that she can use OTC SYSTANE eye drops multiple times per day. There are two types; one being the standard eye drop and the other being a thick gel drop. Please refill Pazeo.

## 2015-02-07 NOTE — Telephone Encounter (Signed)
Informed patient of Dr. Bruna Potter recommendations of the OTC systane drops. Sent in fax for [place], 2 refills.

## 2015-02-07 NOTE — Telephone Encounter (Signed)
Got a fax for a refill of Pazeo 0.7% drops on 02-06-15.  Per paper chart, she was not documented using Pazeo since November 2015. Last OV was May 2016.   Spoke with patient, she states she uses Pazeo PRN for itchy eyes. OK to refill?  Patient also wanted a new Rx... She described her eyes have been running a lot almost like tears lately due to allergies, what she called "dry eyes". Terminology was confusing. But she wanted an Rx to help with that condition. Told patient we would speak with Dr. Neldon Mc and ask him what he suggests.

## 2015-02-11 DIAGNOSIS — K6289 Other specified diseases of anus and rectum: Secondary | ICD-10-CM | POA: Diagnosis not present

## 2015-02-28 ENCOUNTER — Telehealth: Payer: Self-pay | Admitting: Internal Medicine

## 2015-02-28 ENCOUNTER — Encounter: Payer: Self-pay | Admitting: *Deleted

## 2015-02-28 NOTE — Telephone Encounter (Signed)
Pt called want to follow up on the CT result that Dr. Sharlet Salina put on there about the MRI for cyst in the kidney (she want to get this done to find out more). Pt also stated she want to see Dr. Donella Stade at [facility] (she only see this doctor). Please call pt back

## 2015-03-03 NOTE — Telephone Encounter (Signed)
LVM for pt to call back as soon as possible.   

## 2015-03-03 NOTE — Telephone Encounter (Signed)
She does not need a surgeon based on the CT scan. The main recommendation was for her to return to GI and get an EGD (where they use the camera to look in her stomach due to the large narrowing. The cyst is not the urgent finding on the CT scan and likely causing her symptoms.

## 2015-03-04 NOTE — Telephone Encounter (Signed)
Can you please call her back asap in the morning Samantha Bishop?

## 2015-03-04 NOTE — Telephone Encounter (Signed)
Pt returned your call. She will be working outside this afternoon and was hoping you can call her back after 3pm.

## 2015-03-05 NOTE — Telephone Encounter (Signed)
Left a message for patient to call me back.

## 2015-03-05 NOTE — Telephone Encounter (Signed)
Spoke with patient and she agreed to go to GI. Please place referral.

## 2015-03-06 NOTE — Telephone Encounter (Signed)
She already has a GI doctor and just needs to call them for a visit. Dr. Fuller Plan is her GI doctor.

## 2015-03-07 ENCOUNTER — Telehealth: Payer: Self-pay | Admitting: Internal Medicine

## 2015-03-07 NOTE — Telephone Encounter (Signed)
She does not need an MRI of her stomach but she does need to go back to GI (Dr. Fuller Plan).

## 2015-03-07 NOTE — Telephone Encounter (Signed)
Patient states she does not want to be referred to GI for a colonoscopy but she is requesting an MRI for her stomach.

## 2015-03-11 ENCOUNTER — Telehealth: Payer: Self-pay | Admitting: Internal Medicine

## 2015-03-11 NOTE — Telephone Encounter (Signed)
New Message  Pt c/o medication issue:  1. Name of Medication: Pradaxa   2. How are you currently taking this medication (dosage and times per day)? Twice a day   3. Are you having a reaction (difficulty breathing--STAT)?   4. What is your medication issue? Pt called states that she does not want to take pradaxa. Makes her have heart burn. Loosing her hair. Blood in her stool. Pt states that she would like to restart what the coumadin clinic had her on. Pt states that she cannot remember the name of the medication but she would like stop taking pradaxa officially. ( pt states that she has already stopped taking it now for over 1 week.) Please call back to discuss.

## 2015-03-11 NOTE — Telephone Encounter (Signed)
Will forward to Dr. Caryl Comes for review. Patient has been on Pradaxa since at least 2012.

## 2015-03-12 NOTE — Telephone Encounter (Signed)
Spoke with patient. She does not really understand. I am going to have GI call her.

## 2015-03-14 NOTE — Telephone Encounter (Signed)
As best as i can tell she was on Rivaroxaban and not on coumadin   We can resume at 20 mg daily;  Her renal function is appropriate

## 2015-03-14 NOTE — Telephone Encounter (Signed)
Follow up     Pt is still waiting to hear what Dr Caryl Comes said about stopping pradaxa

## 2015-03-17 NOTE — Telephone Encounter (Signed)
I left a message for the patient I will call her back in the morning.

## 2015-03-17 NOTE — Telephone Encounter (Signed)
I called and spoke with the patient and made her aware of Dr. Olin Pia recommendations. She states she was tried on all 3 blood thinners and that there was only 1 that didn't make her hair fall out. I advised her she's been on Pradaxa since 2012 and that as best we could tell, she may have been on Xarelto prior to that. She states the coumadin clinic was the one who tried her on 3 different ones and that she did great with one, but then Dr. Caryl Comes switched her to Pradaxa for unknown reasons to the patient. She would like advisement from the coumadin clinic prior to [place] starting on Xarelto, if they feel this is the best drug/ least side effect drug for her. I advised I would forward to Gay Filler to review as I was unsure of the current NOAC's and the effects they have on hair loss. I also explained after hearing back from [place], I would try to find some samples for her and then call her RX in to [facility] on [address] - 90- day supply.

## 2015-03-17 NOTE — Telephone Encounter (Signed)
I left a message for the patient to call. 

## 2015-03-17 NOTE — Telephone Encounter (Signed)
F/u    Pt returning Heather's call.

## 2015-03-17 NOTE — Telephone Encounter (Signed)
I could not find any interactions with Coumadin clinic or PharmD in the past.  The other 3 were not available in 2012.  She may be confusing her anticoagulation medication with her cholesterol meds as we did see her in the lipid clinic during that timeframe.  Pt took Xarelto for a short perior of time in 2013 after an orthopedic procedure.  Would suggest that now as less side effects than Pradaxa.  Her CrCl is 87 mL/min so she can take 20mg  once daily with food.

## 2015-03-18 MED ORDER — RIVAROXABAN 20 MG PO TABS: 20.0000 mg | ORAL_TABLET | Freq: Every day | ORAL | Status: DC

## 2015-03-18 NOTE — Telephone Encounter (Signed)
I spoke with the patient and made her aware of Sally's recommendations. She is agreeable with starting Xarelto 20 mg once daily. I advised her I didn't have samples of this, but she was agreeable with me sending this to the pharmacy for her.

## 2015-03-20 ENCOUNTER — Emergency Department (HOSPITAL_COMMUNITY): Payer: Medicare Other

## 2015-03-20 ENCOUNTER — Encounter (HOSPITAL_COMMUNITY): Payer: Self-pay | Admitting: Emergency Medicine

## 2015-03-20 ENCOUNTER — Emergency Department (HOSPITAL_COMMUNITY)
Admission: EM | Admit: 2015-03-20 | Discharge: 2015-03-21 | Disposition: A | Discharge status: Home / Self Care | Payer: Medicare Other | Attending: Emergency Medicine | Admitting: Emergency Medicine

## 2015-03-20 DIAGNOSIS — I482 Chronic atrial fibrillation, unspecified: Secondary | ICD-10-CM

## 2015-03-20 DIAGNOSIS — I10 Essential (primary) hypertension: Secondary | ICD-10-CM | POA: Insufficient documentation

## 2015-03-20 DIAGNOSIS — N281 Cyst of kidney, acquired: Secondary | ICD-10-CM | POA: Diagnosis not present

## 2015-03-20 DIAGNOSIS — Z79899 Other long term (current) drug therapy: Secondary | ICD-10-CM | POA: Diagnosis not present

## 2015-03-20 DIAGNOSIS — Z8719 Personal history of other diseases of the digestive system: Secondary | ICD-10-CM | POA: Diagnosis not present

## 2015-03-20 DIAGNOSIS — Z86711 Personal history of pulmonary embolism: Secondary | ICD-10-CM | POA: Insufficient documentation

## 2015-03-20 DIAGNOSIS — Z7901 Long term (current) use of anticoagulants: Secondary | ICD-10-CM | POA: Diagnosis not present

## 2015-03-20 DIAGNOSIS — Z86010 Personal history of colonic polyps: Secondary | ICD-10-CM | POA: Insufficient documentation

## 2015-03-20 DIAGNOSIS — R0602 Shortness of breath: Secondary | ICD-10-CM | POA: Diagnosis not present

## 2015-03-20 DIAGNOSIS — Z880 Allergy status to penicillin: Secondary | ICD-10-CM | POA: Diagnosis not present

## 2015-03-20 DIAGNOSIS — Z87442 Personal history of urinary calculi: Secondary | ICD-10-CM | POA: Diagnosis not present

## 2015-03-20 DIAGNOSIS — M25512 Pain in left shoulder: Secondary | ICD-10-CM | POA: Insufficient documentation

## 2015-03-20 DIAGNOSIS — R0789 Other chest pain: Secondary | ICD-10-CM

## 2015-03-20 DIAGNOSIS — M546 Pain in thoracic spine: Secondary | ICD-10-CM | POA: Insufficient documentation

## 2015-03-20 DIAGNOSIS — R609 Edema, unspecified: Secondary | ICD-10-CM | POA: Insufficient documentation

## 2015-03-20 DIAGNOSIS — Z85828 Personal history of other malignant neoplasm of skin: Secondary | ICD-10-CM | POA: Diagnosis not present

## 2015-03-20 DIAGNOSIS — Z86718 Personal history of other venous thrombosis and embolism: Secondary | ICD-10-CM | POA: Insufficient documentation

## 2015-03-20 DIAGNOSIS — F419 Anxiety disorder, unspecified: Secondary | ICD-10-CM | POA: Insufficient documentation

## 2015-03-20 DIAGNOSIS — Z8669 Personal history of other diseases of the nervous system and sense organs: Secondary | ICD-10-CM | POA: Insufficient documentation

## 2015-03-20 DIAGNOSIS — E785 Hyperlipidemia, unspecified: Secondary | ICD-10-CM | POA: Insufficient documentation

## 2015-03-20 DIAGNOSIS — Z87440 Personal history of urinary (tract) infections: Secondary | ICD-10-CM | POA: Diagnosis not present

## 2015-03-20 DIAGNOSIS — R079 Chest pain, unspecified: Secondary | ICD-10-CM | POA: Diagnosis not present

## 2015-03-20 DIAGNOSIS — I517 Cardiomegaly: Secondary | ICD-10-CM | POA: Diagnosis not present

## 2015-03-20 LAB — CBC
HCT: 40.1 % (ref 36.0–46.0)
Hemoglobin: 13.2 g/dL (ref 12.0–15.0)
MCH: 30.4 pg (ref 26.0–34.0)
MCHC: 32.9 g/dL (ref 30.0–36.0)
MCV: 92.4 fL (ref 78.0–100.0)
Platelets: 222 10*3/uL (ref 150–400)
RBC: 4.34 MIL/uL (ref 3.87–5.11)
RDW: 13.3 % (ref 11.5–15.5)
WBC: 9.4 10*3/uL (ref 4.0–10.5)

## 2015-03-20 NOTE — ED Provider Notes (Signed)
CSN: [id number]     Arrival date & time 03/20/15  2310 History  By signing my name below, I, Eustaquio Maize, attest that this documentation has been prepared under the direction and in the presence of Delora Fuel, MD. Electronically Signed: Eustaquio Maize, ED Scribe. 03/21/2015. 12:07 AM.   Chief Complaint  Patient presents with  . Chest Pain   The history is provided by the patient. No language interpreter was used.     HPI Comments: Samantha Bishop is a 81 y.o. female who presents to the Emergency Department complaining of sudden onset, constant, 10/10, left upper back pain radiating into left shoulder and left chest that began around lunchtime today, gradually worsening. Pt states that she was raking leaves when she began having the pain. She is unable to describe the pain. It is exacerbated with taking a deep breath, twisting and turning, and laying down. She also complains of shortness of breath. Denies nausea, vomiting, diaphoresis, cough, or any other associated symptoms. Pt has had similar symptoms approximately 15 years ago for DVT and PE. Pt is on anticoagulants. She notes that last week she was taken off of Pradaxa and started on Xarelto this week. Pt had a 1 week period of not taking any blood thinner.   Past Medical History  Diagnosis Date  . HTN (hypertension)   . Atrial fibrillation ([place])     managed with rate control and anticoagulation.   . Tachycardia, unspecified   . Pulmonary emboli ([place])     following remote knee arthroscopy surgery  . Hyperlipidemia   . Diverticulosis of colon (without mention of hemorrhage)   . Unspecified hemorrhoids without mention of complication   . Calculus of kidney   . Osteoarthrosis, unspecified whether generalized or localized, unspecified site   . Lumbago   . Cerebral aneurysm, nonruptured   . Anxiety state, unspecified   . Persistent disorder of initiating or maintaining sleep   . Chronic anticoagulation     on Pradaxa  . Atrial  tachycardia ([place])     in the remote past with remote ablation  . UTI (urinary tract infection)     CURRENTLY BEING TX FOR UTI  . Esophageal reflux     YRS AGO - NO PROBLEM NOW  . History of skin cancer   . Ruptured lumbar disc   . Complication of anesthesia     "my heart stopped" 2004 knee  surg - see note on chart  . Sleep apnea     stop bang score 4   . DEEP VENOUS THROMBOPHLEBITIS 08/15/2007    Qualifier: History of  By: Lenna Gilford MD, [place] WITHOUT COMPS 09/04/2009    Qualifier: Diagnosis of  By: Ola Spurr MD, [name]    . OA (osteoarthritis) of knee 09/03/2011  . OSTEOPENIA 02/21/2010    Qualifier: Diagnosis of  By: Lenna Gilford MD, Deborra Medina   . NECK PAIN 08/07/2007  . Colon polyp    Past Surgical History  Procedure Laterality Date  . Tonsillectomy and adenoidectomy    . Appendectomy    . Abdominal hysterectomy    . Knee surgery    . Breast cyst excision    . Blocked intestine surgery    . Knee arthroscopy      X2 L KNEE  . Joint replacement  2004    RT TOTAL KNEE  . Nasal surgery x2    . Hemhorroidectomy    . Total knee arthroplasty  09/03/2011  Procedure: TOTAL KNEE ARTHROPLASTY;  Surgeon: Gearlean Alf, MD;  Location: [facility];  Service: Orthopedics;  Laterality: Left;   Family History  Problem Relation Age of Onset  . Coronary artery disease Father   . Cirrhosis Brother   . Stroke Father   . Colon cancer Son   . Colon polyps Neg Hx   . Kidney disease Neg Hx   . Diabetes Neg Hx    Social History  Substance Use Topics  . Smoking status: Never Smoker   . Smokeless tobacco: Never Used  . Alcohol Use: 0.0 oz/week    0 Standard drinks or equivalent per week     Comment: Rarely   OB History    No data available     Review of Systems  Constitutional: Negative for diaphoresis.  Respiratory: Positive for shortness of breath. Negative for cough.   Cardiovascular: Positive for chest pain.  Gastrointestinal: Negative for nausea and  vomiting.  Musculoskeletal: Positive for back pain (Left upper back) and arthralgias (Left shoulder).  All other systems reviewed and are negative.   Allergies  Codeine and Penicillins  Home Medications   Prior to Admission medications   Medication Sig Start Date End Date Taking? Authorizing Provider  gabapentin (NEURONTIN) 100 MG capsule Takes 3 capsules in the morning & 4 capsules at night 10/17/14   Cameron Sprang, MD  hydrochlorothiazide (HYDRODIURIL) 25 MG tablet TAKE 1/2 TABLET BY MOUTH ONCE DAILY. 12/16/14   Deboraha Sprang, MD  montelukast (SINGULAIR) 10 MG tablet Take 10 mg by mouth as needed.  06/28/14   Historical Provider, MD  rivaroxaban (XARELTO) 20 MG TABS tablet Take 1 tablet (20 mg total) by mouth daily with supper. 03/18/15   Deboraha Sprang, MD  sennosides-docusate sodium (SENOKOT-S) 8.6-50 MG tablet Take 1 tablet by mouth 2 (two) times daily. 01/24/15   Hoyt Koch, MD   Triage VItals: BP 170/89 mmHg  Pulse 88  Temp(Src) 98.1 F (36.7 C) (Oral)  Resp 13  Ht 5\' 6"  (1.676 m)  Wt 180 lb (81.647 kg)  BMI 29.07 kg/m2  SpO2 97%   Physical Exam  Constitutional: She is oriented to person, place, and time. She appears well-developed and well-nourished. No distress.  HENT:  Head: Normocephalic and atraumatic.  Eyes: Conjunctivae and EOM are normal. Pupils are equal, round, and reactive to light.  Neck: Normal range of motion. Neck supple. No JVD present.  Cardiovascular: Normal rate, regular rhythm and normal heart sounds.   No murmur heard. Pulmonary/Chest: Effort normal and breath sounds normal. She has no wheezes. She has no rales. She exhibits tenderness.  Tender left anterolateral chest wall and left paraspinal muscles in the upper thoracic region  Abdominal: Soft. Bowel sounds are normal. She exhibits no distension and no mass. There is no tenderness.  Musculoskeletal: Normal range of motion. She exhibits edema.  Trace edema with venostasis changes    Lymphadenopathy:    She has no cervical adenopathy.  Neurological: She is alert and oriented to person, place, and time. No cranial nerve deficit. She exhibits normal muscle tone. Coordination normal.  Skin: Skin is warm and dry. No rash noted.  Psychiatric: She has a normal mood and affect. Her behavior is normal. Judgment and thought content normal.  Nursing note and vitals reviewed.   ED Course  Procedures (including critical care time)  DIAGNOSTIC STUDIES: Oxygen Saturation is 97% on RA, normal by my interpretation.    COORDINATION OF CARE: 12:02 AM-Discussed treatment plan which  includes CTA Chest and Differential with pt at bedside and pt agreed to plan.   Labs Review Results for orders placed or performed during the hospital encounter of 0000000  Basic metabolic panel  Result Value Ref Range   Sodium 140 135 - 145 mmol/L   Potassium 3.9 3.5 - 5.1 mmol/L   Chloride 104 101 - 111 mmol/L   CO2 28 22 - 32 mmol/L   Glucose, Bld 114 (H) 65 - 99 mg/dL   BUN 11 6 - 20 mg/dL   Creatinine, Ser 0.61 0.44 - 1.00 mg/dL   Calcium 9.9 8.9 - 10.3 mg/dL   GFR calc non Af Amer >60 >60 mL/min   GFR calc Af Amer >60 >60 mL/min   Anion gap 8 5 - 15  CBC  Result Value Ref Range   WBC 9.4 4.0 - 10.5 K/uL   RBC 4.34 3.87 - 5.11 MIL/uL   Hemoglobin 13.2 12.0 - 15.0 g/dL   HCT 40.1 36.0 - 46.0 %   MCV 92.4 78.0 - 100.0 fL   MCH 30.4 26.0 - 34.0 pg   MCHC 32.9 30.0 - 36.0 g/dL   RDW 13.3 11.5 - 15.5 %   Platelets 222 150 - 400 K/uL  Protime-INR - (order if Patient is taking Coumadin / Warfarin)  Result Value Ref Range   Prothrombin Time 27.5 (H) 11.6 - 15.2 seconds   INR 2.60 (H) 0.00 - 1.49  Differential  Result Value Ref Range   Neutrophils Relative % 61 %   Neutro Abs 5.2 1.7 - 7.7 K/uL   Lymphocytes Relative 29 %   Lymphs Abs 2.5 0.7 - 4.0 K/uL   Monocytes Relative 9 %   Monocytes Absolute 0.8 0.1 - 1.0 K/uL   Eosinophils Relative 1 %   Eosinophils Absolute 0.0 0.0 - 0.7  K/uL   Basophils Relative 0 %   Basophils Absolute 0.0 0.0 - 0.1 K/uL  I-Stat Troponin, ED (not at [facility])  Result Value Ref Range   Troponin i, poc 0.02 0.00 - 0.08 ng/mL   Comment 3           Imaging Review Dg Chest 2 View  03/20/2015  CLINICAL DATA:  Chest pain. Severe back and left arm pain, onset tonight. EXAM: CHEST  2 VIEW COMPARISON:  05/05/2011 FINDINGS: Lower lung volumes from prior. Cardiomegaly is unchanged allowing for differences in technique. Stable tortuosity of the thoracic aorta. No pulmonary edema, confluent airspace disease, pleural effusion or pneumothorax. IMPRESSION: Stable cardiomegaly.  No congestive failure or acute process. Electronically Signed   By: Jeb Levering M.D.   On: 03/20/2015 23:54   I have personally reviewed and evaluated these images and lab results as part of my medical decision-making.   EKG Interpretation   Date/Time:  Thursday March 20 2015 23:15:33 EST Ventricular Rate:  93 PR Interval:    QRS Duration: 82 QT Interval:  354 QTC Calculation: 440 R Axis:   42 Text Interpretation:  Atrial fibrillation Anteroseptal infarct , age  undetermined Abnormal ECG When compared with ECG of 12/11/2014, No  significant change was found Confirmed by [facility]  MD, Luismiguel Lamere ([id number]) on  03/20/2015 11:28:18 PM      MDM   Final diagnoses:  Chest wall pain  Atrial fibrillation, chronic (HCC)  Anticoagulant long-term use  Cyst of left kidney    Patient with pain which seems to be chest wall pain. However, she does have history of pulmonary embolism and did have approximately 1 week between  stopping one anticoagulant and starting another. She will be sent for CT angiogram to rule out pulmonary embolism. Review of old records confirms anticoagulation for atrial fibrillation and history of pulmonary embolism with recent switch from Pradaxa to Xarelto.  CT angiogram shows no evidence of pulmonary embolism. Note is made of lack of opacification of atrial  appendage. However, since she is now anticoagulated, that should not be clinically relevant. Pain clearly is chest wall pain and she is discharged with prescription for hydrocodone-acetaminophen. Follow-up with PCP.  I personally performed the services described in this documentation, which was scribed in my presence. The recorded information has been reviewed and is accurate.        Delora Fuel, MD [id number]

## 2015-03-20 NOTE — ED Notes (Signed)
Dr. Glick at the bedside.  

## 2015-03-20 NOTE — ED Notes (Addendum)
Pt. reports left chest pain radiating to left arm and upper back onset this morning with SOB , denies nausea or diaphoresis , no cough or chest congestion . Pt. took 1 baby ASA and Hydrocodone with mild relief . Her cardiologist is Dr. Caryl Comes states history of pulmonary embolism and AFib.

## 2015-03-20 NOTE — ED Notes (Signed)
Patient is currently in xray per triage. Dr. Roxanne Mins aware and has reveiwed ekg.

## 2015-03-21 ENCOUNTER — Encounter (HOSPITAL_COMMUNITY): Payer: Self-pay | Admitting: Radiology

## 2015-03-21 ENCOUNTER — Emergency Department (HOSPITAL_COMMUNITY): Payer: Medicare Other

## 2015-03-21 DIAGNOSIS — R0789 Other chest pain: Secondary | ICD-10-CM | POA: Diagnosis not present

## 2015-03-21 DIAGNOSIS — R079 Chest pain, unspecified: Secondary | ICD-10-CM | POA: Diagnosis not present

## 2015-03-21 LAB — BASIC METABOLIC PANEL
Anion gap: 8 (ref 5–15)
BUN: 11 mg/dL (ref 6–20)
CO2: 28 mmol/L (ref 22–32)
Calcium: 9.9 mg/dL (ref 8.9–10.3)
Chloride: 104 mmol/L (ref 101–111)
Creatinine, Ser: 0.61 mg/dL (ref 0.44–1.00)
GFR calc Af Amer: >60 mL/min (ref >60)
GFR calc non Af Amer: >60 mL/min (ref >60)
Glucose, Bld: 114 mg/dL — ABNORMAL HIGH (ref 65–99)
Potassium: 3.9 mmol/L (ref 3.5–5.1)
Sodium: 140 mmol/L (ref 135–145)

## 2015-03-21 LAB — DIFFERENTIAL
Basophils Absolute: 0 10*3/uL (ref 0.0–0.1)
Basophils Relative: 0 %
Eosinophils Absolute: 0 10*3/uL (ref 0.0–0.7)
Eosinophils Relative: 1 %
Lymphocytes Relative: 29 %
Lymphs Abs: 2.5 10*3/uL (ref 0.7–4.0)
Monocytes Absolute: 0.8 10*3/uL (ref 0.1–1.0)
Monocytes Relative: 9 %
Neutro Abs: 5.2 10*3/uL (ref 1.7–7.7)
Neutrophils Relative %: 61 %

## 2015-03-21 LAB — PROTIME-INR
INR: 2.6 — ABNORMAL HIGH (ref 0.00–1.49)
Prothrombin Time: 27.5 seconds — ABNORMAL HIGH (ref 11.6–15.2)

## 2015-03-21 LAB — I-STAT TROPONIN, ED: Troponin i, poc: 0.02 ng/mL (ref 0.00–0.08)

## 2015-03-21 MED ORDER — MORPHINE SULFATE (PF) 4 MG/ML IV SOLN
4.0000 mg | Freq: Once | INTRAVENOUS | Status: AC
  Administered 2015-03-21: 4 mg via INTRAVENOUS
  Filled 2015-03-21: qty 1

## 2015-03-21 MED ORDER — HYDROCODONE-ACETAMINOPHEN 5-325 MG PO TABS: 1.0000 | ORAL_TABLET | ORAL | Status: DC | PRN

## 2015-03-21 MED ORDER — IOHEXOL 350 MG/ML SOLN
80.0000 mL | Freq: Once | INTRAVENOUS | Status: AC | PRN
  Administered 2015-03-21: 100 mL via INTRAVENOUS

## 2015-03-21 NOTE — ED Notes (Signed)
Pt out of room to CT 

## 2015-03-21 NOTE — ED Notes (Signed)
Dr. Glick at the bedside.  

## 2015-03-21 NOTE — Discharge Instructions (Signed)
Your CT scan does show a cyst on your left kidney. Radiologist has recommended MRI to further characterize that. Please arrange that with your primary care provider.  Your Xarelto should be taken once a day.  Chest Wall Pain Chest wall pain is pain in or around the bones and muscles of your chest. Sometimes, an injury causes this pain. Sometimes, the cause may not be known. This pain may take several weeks or longer to get better. HOME CARE INSTRUCTIONS  Pay attention to any changes in your symptoms. Take these actions to help with your pain:   Rest as told by your health care provider.   Avoid activities that cause pain. These include any activities that use your chest muscles or your abdominal and side muscles to lift heavy items.   If directed, apply ice to the painful area:  Put ice in a plastic bag.  Place a towel between your skin and the bag.  Leave the ice on for 20 minutes, 2-3 times per day.  Take over-the-counter and prescription medicines only as told by your health care provider.  Do not use tobacco products, including cigarettes, chewing tobacco, and e-cigarettes. If you need help quitting, ask your health care provider.  Keep all follow-up visits as told by your health care provider. This is important. SEEK MEDICAL CARE IF:  You have a fever.  Your chest pain becomes worse.  You have new symptoms. SEEK IMMEDIATE MEDICAL CARE IF:  You have nausea or vomiting.  You feel sweaty or light-headed.  You have a cough with phlegm (sputum) or you cough up blood.  You develop shortness of breath.   This information is not intended to replace advice given to you by your health care provider. Make sure you discuss any questions you have with your health care provider.   Document Released: 04/26/2005 Document Revised: 01/15/2015 Document Reviewed: 07/22/2014 Elsevier Interactive Patient Education 2016 [place].  Acetaminophen; Hydrocodone tablets or  capsules What is this medicine? ACETAMINOPHEN; HYDROCODONE (a set a MEE noe fen; hye droe KOE done) is a pain reliever. It is used to treat moderate to severe pain. This medicine may be used for other purposes; ask your health care provider or pharmacist if you have questions. What should I tell my health care provider before I take this medicine? They need to know if you have any of these conditions: -brain tumor -Crohn's disease, inflammatory bowel disease, or ulcerative colitis -drug abuse or addiction -head injury -heart or circulation problems -if you often drink alcohol -kidney disease or problems going to the bathroom -liver disease -lung disease, asthma, or breathing problems -an unusual or allergic reaction to acetaminophen, hydrocodone, other opioid analgesics, other medicines, foods, dyes, or preservatives -pregnant or trying to get pregnant -breast-feeding How should I use this medicine? Take this medicine by mouth. Swallow it with a full glass of water. Follow the directions on the prescription label. If the medicine upsets your stomach, take the medicine with food or milk. Do not take more than you are told to take. Talk to your pediatrician regarding the use of this medicine in children. This medicine is not approved for use in children. Patients over 65 years may have a stronger reaction and need a smaller dose. Overdosage: If you think you have taken too much of this medicine contact a poison control center or emergency room at once. NOTE: This medicine is only for you. Do not share this medicine with others. What if I miss a dose?  If you miss a dose, take it as soon as you can. If it is almost time for your next dose, take only that dose. Do not take double or extra doses. What may interact with this medicine? -alcohol -antihistamines -isoniazid -medicines for depression, anxiety, or psychotic disturbances -medicines for sleep -muscle  relaxants -naltrexone -narcotic medicines (opiates) for pain -phenobarbital -ritonavir -tramadol This list may not describe all possible interactions. Give your health care provider a list of all the medicines, herbs, non-prescription drugs, or dietary supplements you use. Also tell them if you smoke, drink alcohol, or use illegal drugs. Some items may interact with your medicine. What should I watch for while using this medicine? Tell your doctor or health care professional if your pain does not go away, if it gets worse, or if you have new or a different type of pain. You may develop tolerance to the medicine. Tolerance means that you will need a higher dose of the medicine for pain relief. Tolerance is normal and is expected if you take the medicine for a long time. Do not suddenly stop taking your medicine because you may develop a severe reaction. Your body becomes used to the medicine. This does NOT mean you are addicted. Addiction is a behavior related to getting and using a drug for a non-medical reason. If you have pain, you have a medical reason to take pain medicine. Your doctor will tell you how much medicine to take. If your doctor wants you to stop the medicine, the dose will be slowly lowered over time to avoid any side effects. You may get drowsy or dizzy when you first start taking the medicine or change doses. Do not drive, use machinery, or do anything that may be dangerous until you know how the medicine affects you. Stand or sit up slowly. There are different types of narcotic medicines (opiates) for pain. If you take more than one type at the same time, you may have more side effects. Give your health care provider a list of all medicines you use. Your doctor will tell you how much medicine to take. Do not take more medicine than directed. Call emergency for help if you have problems breathing. The medicine will cause constipation. Try to have a bowel movement at least every 2 to 3  days. If you do not have a bowel movement for 3 days, call your doctor or health care professional. Too much acetaminophen can be very dangerous. Do not take Tylenol (acetaminophen) or medicines that contain acetaminophen with this medicine. Many non-prescription medicines contain acetaminophen. Always read the labels carefully. What side effects may I notice from receiving this medicine? Side effects that you should report to your doctor or health care professional as soon as possible: -allergic reactions like skin rash, itching or hives, swelling of the face, lips, or tongue -breathing problems -confusion -feeling faint or lightheaded, falls -stomach pain -yellowing of the eyes or skin Side effects that usually do not require medical attention (report to your doctor or health care professional if they continue or are bothersome): -nausea, vomiting -stomach upset This list may not describe all possible side effects. Call your doctor for medical advice about side effects. You may report side effects to FDA at 1-800-FDA-1088. Where should I keep my medicine? Keep out of the reach of children. This medicine can be abused. Keep your medicine in a safe place to protect it from theft. Do not share this medicine with anyone. Selling or giving away this medicine  is dangerous and against the law. This medicine may cause accidental overdose and death if it taken by other adults, children, or pets. Mix any unused medicine with a substance like cat litter or coffee grounds. Then throw the medicine away in a sealed container like a sealed bag or a coffee can with a lid. Do not use the medicine after the expiration date. Store at room temperature between 15 and 30 degrees C (59 and 86 degrees F). NOTE: This sheet is a summary. It may not cover all possible information. If you have questions about this medicine, talk to your doctor, pharmacist, or health care provider.    2016, Elsevier/Gold Standard.  (2014-03-27 15:29:20)

## 2015-04-08 ENCOUNTER — Encounter: Payer: Self-pay | Admitting: Internal Medicine

## 2015-04-08 ENCOUNTER — Ambulatory Visit (INDEPENDENT_AMBULATORY_CARE_PROVIDER_SITE_OTHER): Payer: Medicare Other | Admitting: Internal Medicine

## 2015-04-08 VITALS — BP 160/80 | HR 91 | Temp 98.3°F | Resp 14 | Ht 66.5 in

## 2015-04-08 DIAGNOSIS — M546 Pain in thoracic spine: Secondary | ICD-10-CM | POA: Diagnosis not present

## 2015-04-08 DIAGNOSIS — N281 Cyst of kidney, acquired: Secondary | ICD-10-CM

## 2015-04-08 DIAGNOSIS — Q6100 Congenital renal cyst, unspecified: Secondary | ICD-10-CM | POA: Diagnosis not present

## 2015-04-08 DIAGNOSIS — R938 Abnormal findings on diagnostic imaging of other specified body structures: Secondary | ICD-10-CM | POA: Diagnosis not present

## 2015-04-08 DIAGNOSIS — R9389 Abnormal findings on diagnostic imaging of other specified body structures: Secondary | ICD-10-CM | POA: Insufficient documentation

## 2015-04-08 DIAGNOSIS — M549 Dorsalgia, unspecified: Secondary | ICD-10-CM | POA: Insufficient documentation

## 2015-04-08 NOTE — Assessment & Plan Note (Signed)
Getting US kidneys to evaluate cyst on the left kidney. Pulled out images and showed to patient during visit and explained to her what was needed. She agrees to go back to GI and talk to them about evaluation of the stomach lesion.

## 2015-04-08 NOTE — Assessment & Plan Note (Signed)
Feels like a muscle spasm on the right side of the thoracic region. She does want to see someone about getting a cortisone injection in the area. Will have her schedule with Dr. Tamala Julian.

## 2015-04-08 NOTE — Progress Notes (Signed)
Pre visit review using our clinic review tool, if applicable. No additional management support is needed unless otherwise documented below in the visit note. 

## 2015-04-08 NOTE — Patient Instructions (Signed)
You can use tylenol for the pain in your back and we will have you see Dr. Tamala Julian for the pain.   We will check the ultrasound of the kidneys to check on that spot. The other thing they were worried about is the stomach and you would have to go back to the GI doctor about that.

## 2015-04-08 NOTE — Progress Notes (Signed)
   Subjective:    Patient ID: Samantha Bishop, female    DOB: 29-Nov-1924, 81 y.o.   MRN: [id number]  HPI The patient is an 81 YO female coming in for ER follow up (seen for musculoskeletal chest wall strain, given hydrocodone, ruled out PE). She is doing better but still one spot on the back. She is concerned about a spot on her kidney and was told she needed an MRI. She denies new complaints. She has used the hydrocodone for pain. No new injuries. Had originally pulled the muscle doing yardwork.   Review of Systems  Constitutional: Negative.   Eyes: Negative.   Respiratory: Negative.   Cardiovascular: Negative.   Genitourinary: Negative for dysuria, frequency, flank pain and difficulty urinating.  Musculoskeletal: Positive for myalgias and back pain. Negative for arthralgias.  Skin: Negative.   Neurological: Negative.       Objective:   Physical Exam  Constitutional: She is oriented to person, place, and time. She appears well-developed and well-nourished. No distress.  HENT:  Head: Normocephalic and atraumatic.  Eyes: EOM are normal.  Neck: Normal range of motion.  Cardiovascular: Normal rate.   Pulmonary/Chest: Effort normal and breath sounds normal.  Abdominal: Soft. Bowel sounds are normal. She exhibits no distension. There is no tenderness. There is no rebound.  Musculoskeletal:  Small spot on the back paraspinally with muscle in spasm.   Neurological: She is alert and oriented to person, place, and time. Coordination normal.  Skin: Skin is warm and dry.   Filed Vitals:   04/08/15 1326  BP: 160/80  Pulse: 91  Temp: 98.3 F (36.8 C)  TempSrc: Oral  Resp: 14  Height: 5' 6.5" (1.689 m)  SpO2: 96%      Assessment & Plan:

## 2015-04-11 ENCOUNTER — Ambulatory Visit
Admission: RE | Admit: 2015-04-11 | Discharge: 2015-04-11 | Disposition: A | Discharge status: Home / Self Care | Payer: Medicare Other | Source: Ambulatory Visit | Attending: Internal Medicine | Admitting: Internal Medicine

## 2015-04-11 DIAGNOSIS — N200 Calculus of kidney: Secondary | ICD-10-CM | POA: Diagnosis not present

## 2015-04-11 DIAGNOSIS — N281 Cyst of kidney, acquired: Secondary | ICD-10-CM

## 2015-06-18 ENCOUNTER — Other Ambulatory Visit: Payer: Self-pay | Admitting: Internal Medicine

## 2015-06-24 ENCOUNTER — Other Ambulatory Visit: Payer: Self-pay | Admitting: Internal Medicine

## 2015-07-28 DIAGNOSIS — H04123 Dry eye syndrome of bilateral lacrimal glands: Secondary | ICD-10-CM | POA: Diagnosis not present

## 2015-08-21 ENCOUNTER — Encounter: Payer: Self-pay | Admitting: Nurse Practitioner

## 2015-08-22 ENCOUNTER — Encounter: Payer: Self-pay | Admitting: Internal Medicine

## 2015-09-22 DIAGNOSIS — N816 Rectocele: Secondary | ICD-10-CM | POA: Diagnosis not present

## 2015-09-22 DIAGNOSIS — N8111 Cystocele, midline: Secondary | ICD-10-CM | POA: Diagnosis not present

## 2015-09-22 DIAGNOSIS — Z0000 Encounter for general adult medical examination without abnormal findings: Secondary | ICD-10-CM | POA: Diagnosis not present

## 2015-10-13 ENCOUNTER — Encounter: Payer: Self-pay | Admitting: Gastroenterology

## 2015-10-13 ENCOUNTER — Ambulatory Visit (INDEPENDENT_AMBULATORY_CARE_PROVIDER_SITE_OTHER): Payer: Medicare Other | Admitting: Gastroenterology

## 2015-10-13 VITALS — BP 142/86 | HR 80 | Ht 66.5 in | Wt 189.0 lb

## 2015-10-13 DIAGNOSIS — R1032 Left lower quadrant pain: Secondary | ICD-10-CM | POA: Diagnosis not present

## 2015-10-13 DIAGNOSIS — R933 Abnormal findings on diagnostic imaging of other parts of digestive tract: Secondary | ICD-10-CM | POA: Diagnosis not present

## 2015-10-13 DIAGNOSIS — K5901 Slow transit constipation: Secondary | ICD-10-CM | POA: Diagnosis not present

## 2015-10-13 NOTE — Patient Instructions (Signed)
You are scheduled for a Barium enema x-ray examination on 10/23/15 at 10:30am.   Be prepared to spend 2 to 2 1/2 hours at the Radiology Department  It is very important that you follow the instructions below.  Failure to follow these instructions may result in a study that is less than optimal and may lead to the necessity of repeating the examination.   Barium Enema Prep  Please purchase the following items from the laxative section of your drug store 10 oz.  Magnesium Citrate Bottle 3 Bisacodyl Tablets-Enteric coated 1 Bisacodyl Suppository   Day before exam  12:00 Lunch. This meal may include clear broth, white chicken meat sandwich (no butter, lettuce or other additives), or two hard boiled eggs, strained fruit juices, jello or gelatin ( not containing fruits or nuts).  Coffee or tea (without milk or cream), carbonated beverages.  1:00 pm Drink one full glass or more of water   3:00 pm Drink one full glass or more of water  5:00 pm  Supper.  It is very important that supper be limited to the items described here Broth, Clear jello/gelatin with no whole foods added (no red), soft drinks, gatorade, water black coffee or tea (no milk or cream), popsicles.   No red jello or popsicles.   7:00 pm drink one full glass of water  8:00 pm Drink the bottle of Magnesium Citrate (drink cold if preferred)  10:00 pm  Take three (3) Bisacodyl tablets with one full glass or more of water.   Day of exam NOTHING TO EAT OR DRINK AFTER MIDNIGHT! 7:00 am unwrap the foil wrapper from bisacodyl suppository and discard the foil.  While lying on your side with thigh elevated, insert the suppository into the rectum and gently push in as far as possible.  Retain the suppository for at least 15 minutes, if possible, before evacuating, even if the urge is strong. Bowel evacuation usually occurs within 15 to 60 minutes.  Patients requiring assistance should have a bed pan, commode or help readily  available.   Report to the radiology department at [facility] Radiology department on 10/23/15 at 10:30am.

## 2015-10-13 NOTE — Progress Notes (Signed)
    History of Present Illness: This is an unaccompanied 82 year old female complaining of mild left lower quadrant pain and constipation. Please refer to my note from 08/14/2014 when similar symptoms were present. In addition she relates a complication during knee surgery in 2004 when her "heart stopped". She's been advised to avoid general anesthesia if possible. She relates mild pain/discomfort in her left lower quadrant just before bowel movement and the pain is relieved by bowel movements. She notes hard pellet-like stools and straining at other times during the day. She takes prunes, prune juice and Activia with some improvement in symptoms. She also uses Senokot S intermittently.   Abd/pelvic CT 01/2015 IMPRESSION: 1. Sigmoid diverticulosis without evidence for diverticulitis. 2. The large a small bowel are otherwise unremarkable without evidence for obstruction. There may be some small bowel adhesions. 3. Marked wall thickening in the fundus of the stomach with significant compromise of the lumen. This could represent severe focal gastritis. Neoplasm must be considered. Consider upper endoscopy for further evaluation. 4. Bilateral nonobstructing nephrolithiasis. 5. Indeterminate 16 mm lesion at the upper pole of the left kidney. Ultrasound or MRI with contrast may be used for further evaluation. When the patient is clinically stable and able to follow directions and hold their breath (preferably as an outpatient) further evaluation with dedicated abdominal MRI should be considered. 6. Multilevel degenerative changes are present in the lumbar spine and SI joints bilaterally.  Current Medications, Allergies, Past Medical History, Past Surgical History, Family History and Social History were reviewed in [employer] record.  Physical Exam: General: Well developed, well nourished, no acute distress Head: Normocephalic and atraumatic Eyes:  sclerae anicteric, EOMI Ears: Normal  auditory acuity Mouth: No deformity or lesions Lungs: Clear throughout to auscultation Heart: Regular rate and rhythm; no murmurs, rubs or bruits Abdomen: Soft, non tender and non distended. No masses, hepatosplenomegaly or hernias noted. Normal Bowel sounds Musculoskeletal: Symmetrical with no gross deformities  Pulses:  Normal pulses noted Extremities: No clubbing, cyanosis, edema or deformities noted Neurological: Alert oriented x 4, grossly nonfocal Psychological:  Alert and cooperative. Normal mood and affect  Assessment and Recommendations:  1. Constipation with mild left lower quadrant pain that is relieved with bowel movements. These symptoms have been present for at least 1 year and are likely due to constipation. She is not a good candidate for sedated procedures given her anesthesia history, anticoagulation needs and age. Schedule air contrast barium enema for further evaluation. Advised her to take Senokot-S once daily or once every other day on a regular schedule in addition to the other measures she uses to control her constipation.  2. Abnormal CT of the stomach in September 2016. No associated upper gastrointestinal symptoms. Upper GI series after ACBE to further evaluate.  3. Chronic anticoagulation with Xarelto.

## 2015-10-22 ENCOUNTER — Ambulatory Visit (HOSPITAL_COMMUNITY)
Admission: RE | Admit: 2015-10-22 | Discharge: 2015-10-22 | Disposition: A | Discharge status: Home / Self Care | Payer: Medicare Other | Source: Ambulatory Visit | Attending: Gastroenterology | Admitting: Gastroenterology

## 2015-10-22 ENCOUNTER — Other Ambulatory Visit: Payer: Self-pay | Admitting: Gastroenterology

## 2015-10-22 ENCOUNTER — Ambulatory Visit (HOSPITAL_COMMUNITY): Admission: RE | Admit: 2015-10-22 | Payer: Medicare Other | Source: Ambulatory Visit

## 2015-10-22 DIAGNOSIS — K5901 Slow transit constipation: Secondary | ICD-10-CM

## 2015-10-22 DIAGNOSIS — R1032 Left lower quadrant pain: Secondary | ICD-10-CM | POA: Diagnosis not present

## 2015-10-22 DIAGNOSIS — K5730 Diverticulosis of large intestine without perforation or abscess without bleeding: Secondary | ICD-10-CM | POA: Diagnosis not present

## 2015-10-22 DIAGNOSIS — K5790 Diverticulosis of intestine, part unspecified, without perforation or abscess without bleeding: Secondary | ICD-10-CM | POA: Diagnosis not present

## 2015-10-23 ENCOUNTER — Ambulatory Visit (HOSPITAL_COMMUNITY): Admission: RE | Admit: 2015-10-23 | Payer: Medicare Other | Source: Ambulatory Visit

## 2015-10-27 ENCOUNTER — Other Ambulatory Visit: Payer: Self-pay

## 2015-10-27 ENCOUNTER — Telehealth: Payer: Self-pay | Admitting: Gastroenterology

## 2015-10-27 DIAGNOSIS — R1032 Left lower quadrant pain: Secondary | ICD-10-CM

## 2015-10-27 DIAGNOSIS — K5900 Constipation, unspecified: Secondary | ICD-10-CM

## 2015-10-28 ENCOUNTER — Telehealth: Payer: Self-pay | Admitting: Gastroenterology

## 2015-10-28 NOTE — Telephone Encounter (Signed)
Samantha Bishop for the patient asking her to call me back regarding the Upper Gi series test information, date and time.

## 2015-10-28 NOTE — Telephone Encounter (Signed)
Notes Recorded by Barron Alvine, RN on 10/27/2015 at 4:05 PM You have been scheduled for an Upper GI Series at [facility]. Your appointment is on 11/07/15 at 10 am. Please arrive 15 minutes prior to your test for registration. Make sure not to eat or drink anything after midnight on the night before your test. If you need to reschedule, please call radiology at [phone]. ________________________________________________________________ An upper GI series uses x rays to help diagnose problems of the upper GI tract, which includes the esophagus, stomach, and duodenum. The duodenum is the first part of the small intestine. An upper GI series is conducted by a radiology technologist or a radiologist-a doctor who specializes in x-ray imaging-at a hospital or outpatient center. While sitting or standing in front of an x-ray machine, the patient drinks barium liquid, which is often white and has a chalky consistency and taste. The barium liquid coats the lining of the upper GI tract and makes signs of disease show up more clearly on x rays. X-ray video, called fluoroscopy, is used to view the barium liquid moving through the esophagus, stomach, and duodenum. Additional x rays and fluoroscopy are performed while the patient lies on an x-ray table. To fully coat the upper GI tract with barium liquid, the technologist or radiologist may press on the abdomen or ask the patient to change position. Patients hold still in various positions, allowing the technologist or radiologist to take x rays of the upper GI tract at different angles. If a technologist conducts the upper GI series, a radiologist will later examine the images to look for problems.  This test typically takes about 1 hour to complete.

## 2015-10-28 NOTE — Telephone Encounter (Signed)
See alternate note  

## 2015-10-28 NOTE — Telephone Encounter (Signed)
See BE impression-only extensive diverticulosis noted    Schedule UGI series to evaluate abnl CT of stomach on 01/2015 CT (see 6/5 office note)

## 2015-10-29 ENCOUNTER — Encounter: Payer: Self-pay | Admitting: Internal Medicine

## 2015-10-29 ENCOUNTER — Ambulatory Visit (INDEPENDENT_AMBULATORY_CARE_PROVIDER_SITE_OTHER): Payer: Medicare Other | Admitting: Internal Medicine

## 2015-10-29 VITALS — BP 154/92 | HR 84 | Ht 66.0 in | Wt 186.2 lb

## 2015-10-29 DIAGNOSIS — I482 Chronic atrial fibrillation, unspecified: Secondary | ICD-10-CM

## 2015-10-29 DIAGNOSIS — I10 Essential (primary) hypertension: Secondary | ICD-10-CM

## 2015-10-29 NOTE — Patient Instructions (Signed)

## 2015-10-29 NOTE — Telephone Encounter (Signed)
The patient called and asked what this test is.  I explained it and gave her the instructions of date and time.

## 2015-10-29 NOTE — Progress Notes (Signed)
[name]      Patient Care Team: Hoyt Koch, MD as PCP - General (Internal Medicine)   HPI  Samantha Bishop is a 82 y.o. female seen in followup for atrial fibrillation associated with symptomatic palpitations. She is a CHADS VASC score of 4 and is on Pradaxa. She  has hypertension.  She has alopecia which is precluded the use of beta blockers.  Echocardiogram 2010 demonstrated normal left ventricular function. She was last seen in our office about one year ago prior to knee surgery. Ago     There is no significant issues with shortness of breath or chest pain.        Past Medical History  Diagnosis Date  . HTN (hypertension)   . Permanent atrial fibrillation ([place])   . Pulmonary emboli ([place])     following remote knee arthroscopy surgery  . Hyperlipidemia   . Diverticulosis of colon (without mention of hemorrhage)   . Unspecified hemorrhoids without mention of complication   . Calculus of kidney   . Osteoarthrosis, unspecified whether generalized or localized, unspecified site   . Lumbago   . Cerebral aneurysm, nonruptured   . Anxiety state, unspecified   . History of skin cancer   . Ruptured lumbar disc   . Complication of anesthesia     "my heart stopped" 2004 knee  surg - see note on chart  . Sleep apnea     stop bang score 4   . DEEP VENOUS THROMBOPHLEBITIS 08/15/2007    Qualifier: History of  By: Lenna Gilford MD, Deborra Medina   . Colon polyp     Past Surgical History  Procedure Laterality Date  . Tonsillectomy and adenoidectomy    . Appendectomy    . Abdominal hysterectomy    . Knee surgery    . Breast cyst excision    . Blocked intestine surgery    . Knee arthroscopy      X2 L KNEE  . Joint replacement  2004    RT TOTAL KNEE  . Nasal surgery x2    . Hemhorroidectomy    . Total knee arthroplasty  09/03/2011    Procedure: TOTAL KNEE ARTHROPLASTY;  Surgeon: Gearlean Alf, MD;  Location: [facility];  Service: Orthopedics;  Laterality: Left;    Current  Outpatient Prescriptions  Medication Sig Dispense Refill  . gabapentin (NEURONTIN) 100 MG capsule TAKE 3 CAPSULES BY MOUTH IN THE MORNING AND 4 CAPSULES AT NIGHT  3  . hydrochlorothiazide (HYDRODIURIL) 25 MG tablet TAKE 1/2 TABLET BY MOUTH ONCE DAILY. 45 tablet 1  . HYDROcodone-acetaminophen (NORCO) 5-325 MG tablet Take 1 tablet by mouth every 4 (four) hours as needed for moderate pain. 20 tablet 0  . rivaroxaban (XARELTO) 20 MG TABS tablet Take 1 tablet (20 mg total) by mouth daily with supper. 90 tablet 3  . sennosides-docusate sodium (SENOKOT-S) 8.6-50 MG tablet Take 1 tablet by mouth 2 (two) times daily. 180 tablet 3   No current facility-administered medications for this visit.    Allergies  Allergen Reactions  . Codeine     REACTION: causes nausea  . Penicillins     REACTION: rash    Review of Systems negative except from HPI and PMH  Physical Exam BP 154/92 mmHg  Pulse 84  Ht 5\' 6"  (1.676 m)  Wt 186 lb 3.2 oz (84.46 kg)  BMI 30.07 kg/m2  SpO2 92% Well developed and well nourished in no acute distress HENT normal E scleral and icterus clear Neck  Supple JVP flat; carotids brisk and full Clear to ausculation  Irregular rate and rhythm, no murmurs gallops or rub Soft with active bowel sounds No clubbing cyanosis Trace Edema Alert and oriented, grossly normal motor and sensory function Skin Warm and Dry  ECG  Atrial Fibrillation controlled ventricular response  Assessment and  Plan  Atrial fibrillation  Sinusitis/bronchitis  Hypertension  Fatigue  Abdominal pain with Pradaxa  She is stale from cardiac perspective   Continue current meds \ If symptoms perisit that require surgery i have recommended that somebody go back and look at the records of concern from 14 hyrs ago

## 2015-11-06 DIAGNOSIS — H04123 Dry eye syndrome of bilateral lacrimal glands: Secondary | ICD-10-CM | POA: Diagnosis not present

## 2015-11-07 ENCOUNTER — Ambulatory Visit (HOSPITAL_COMMUNITY)
Admission: RE | Admit: 2015-11-07 | Discharge: 2015-11-07 | Disposition: A | Discharge status: Home / Self Care | Payer: Medicare Other | Source: Ambulatory Visit | Attending: Gastroenterology | Admitting: Gastroenterology

## 2015-11-07 DIAGNOSIS — K5900 Constipation, unspecified: Secondary | ICD-10-CM | POA: Diagnosis not present

## 2015-11-07 DIAGNOSIS — R1032 Left lower quadrant pain: Secondary | ICD-10-CM | POA: Diagnosis not present

## 2015-11-07 DIAGNOSIS — K449 Diaphragmatic hernia without obstruction or gangrene: Secondary | ICD-10-CM | POA: Insufficient documentation

## 2015-12-02 ENCOUNTER — Telehealth: Payer: Self-pay | Admitting: Neurology

## 2015-12-02 NOTE — Telephone Encounter (Signed)
Reduce to 1 at bedtime for a week, then stop. Schedule f/u for any problems. Thanks

## 2015-12-02 NOTE — Telephone Encounter (Signed)
Patient would like to come off of gabapentin since it is not helping.  She is is down to taking 2 at bedtime.  She was taking 7 a day.  Can she stop these?

## 2015-12-02 NOTE — Telephone Encounter (Signed)
Patient given instructions and agreed with plan.  

## 2015-12-12 ENCOUNTER — Other Ambulatory Visit: Payer: Self-pay | Admitting: Internal Medicine

## 2016-01-05 DIAGNOSIS — Z961 Presence of intraocular lens: Secondary | ICD-10-CM | POA: Diagnosis not present

## 2016-01-05 DIAGNOSIS — H04123 Dry eye syndrome of bilateral lacrimal glands: Secondary | ICD-10-CM | POA: Diagnosis not present

## 2016-02-16 ENCOUNTER — Other Ambulatory Visit: Payer: Self-pay | Admitting: Internal Medicine

## 2016-03-10 ENCOUNTER — Other Ambulatory Visit: Payer: Self-pay | Admitting: Allergy and Immunology

## 2016-04-16 ENCOUNTER — Other Ambulatory Visit: Payer: Self-pay

## 2016-06-04 DIAGNOSIS — Z23 Encounter for immunization: Secondary | ICD-10-CM | POA: Diagnosis not present

## 2016-06-05 ENCOUNTER — Other Ambulatory Visit: Payer: Self-pay | Admitting: Internal Medicine

## 2016-06-26 ENCOUNTER — Inpatient Hospital Stay (HOSPITAL_COMMUNITY)
Admission: EM | Admit: 2016-06-26 | Discharge: 2016-06-29 | DRG: 065 | Disposition: A | Discharge status: Skilled Nursing Facility | Payer: Medicare Other | Attending: Internal Medicine | Admitting: Internal Medicine

## 2016-06-26 ENCOUNTER — Emergency Department (HOSPITAL_COMMUNITY): Payer: Medicare Other

## 2016-06-26 ENCOUNTER — Encounter (HOSPITAL_COMMUNITY): Payer: Self-pay | Admitting: Emergency Medicine

## 2016-06-26 ENCOUNTER — Inpatient Hospital Stay (HOSPITAL_COMMUNITY): Payer: Medicare Other

## 2016-06-26 ENCOUNTER — Ambulatory Visit (INDEPENDENT_AMBULATORY_CARE_PROVIDER_SITE_OTHER)
Admission: EM | Admit: 2016-06-26 | Discharge: 2016-06-26 | Disposition: A | Discharge status: Nursing Home (Includes ICF) | Payer: Medicare Other | Source: Home / Self Care | Attending: Family Medicine | Admitting: Family Medicine

## 2016-06-26 ENCOUNTER — Encounter (HOSPITAL_COMMUNITY): Payer: Self-pay | Admitting: *Deleted

## 2016-06-26 DIAGNOSIS — R42 Dizziness and giddiness: Secondary | ICD-10-CM | POA: Diagnosis not present

## 2016-06-26 DIAGNOSIS — M6281 Muscle weakness (generalized): Secondary | ICD-10-CM | POA: Diagnosis not present

## 2016-06-26 DIAGNOSIS — Z885 Allergy status to narcotic agent status: Secondary | ICD-10-CM | POA: Diagnosis not present

## 2016-06-26 DIAGNOSIS — Z87442 Personal history of urinary calculi: Secondary | ICD-10-CM | POA: Diagnosis not present

## 2016-06-26 DIAGNOSIS — Z86718 Personal history of other venous thrombosis and embolism: Secondary | ICD-10-CM | POA: Diagnosis not present

## 2016-06-26 DIAGNOSIS — H53461 Homonymous bilateral field defects, right side: Secondary | ICD-10-CM | POA: Diagnosis not present

## 2016-06-26 DIAGNOSIS — G4733 Obstructive sleep apnea (adult) (pediatric): Secondary | ICD-10-CM | POA: Diagnosis present

## 2016-06-26 DIAGNOSIS — M5481 Occipital neuralgia: Secondary | ICD-10-CM | POA: Diagnosis not present

## 2016-06-26 DIAGNOSIS — I671 Cerebral aneurysm, nonruptured: Secondary | ICD-10-CM | POA: Diagnosis present

## 2016-06-26 DIAGNOSIS — H04129 Dry eye syndrome of unspecified lacrimal gland: Secondary | ICD-10-CM | POA: Diagnosis not present

## 2016-06-26 DIAGNOSIS — N390 Urinary tract infection, site not specified: Secondary | ICD-10-CM | POA: Diagnosis present

## 2016-06-26 DIAGNOSIS — Z86711 Personal history of pulmonary embolism: Secondary | ICD-10-CM | POA: Diagnosis not present

## 2016-06-26 DIAGNOSIS — I10 Essential (primary) hypertension: Secondary | ICD-10-CM | POA: Diagnosis present

## 2016-06-26 DIAGNOSIS — Z8672 Personal history of thrombophlebitis: Secondary | ICD-10-CM

## 2016-06-26 DIAGNOSIS — Z888 Allergy status to other drugs, medicaments and biological substances status: Secondary | ICD-10-CM | POA: Diagnosis not present

## 2016-06-26 DIAGNOSIS — Z880 Allergy status to penicillin: Secondary | ICD-10-CM

## 2016-06-26 DIAGNOSIS — R531 Weakness: Secondary | ICD-10-CM | POA: Diagnosis not present

## 2016-06-26 DIAGNOSIS — Z8249 Family history of ischemic heart disease and other diseases of the circulatory system: Secondary | ICD-10-CM

## 2016-06-26 DIAGNOSIS — F4323 Adjustment disorder with mixed anxiety and depressed mood: Secondary | ICD-10-CM | POA: Diagnosis not present

## 2016-06-26 DIAGNOSIS — E876 Hypokalemia: Secondary | ICD-10-CM | POA: Diagnosis present

## 2016-06-26 DIAGNOSIS — Z86010 Personal history of colonic polyps: Secondary | ICD-10-CM | POA: Diagnosis not present

## 2016-06-26 DIAGNOSIS — Z85828 Personal history of other malignant neoplasm of skin: Secondary | ICD-10-CM

## 2016-06-26 DIAGNOSIS — I639 Cerebral infarction, unspecified: Secondary | ICD-10-CM | POA: Diagnosis present

## 2016-06-26 DIAGNOSIS — I6521 Occlusion and stenosis of right carotid artery: Secondary | ICD-10-CM | POA: Diagnosis not present

## 2016-06-26 DIAGNOSIS — I4891 Unspecified atrial fibrillation: Secondary | ICD-10-CM | POA: Diagnosis present

## 2016-06-26 DIAGNOSIS — I63332 Cerebral infarction due to thrombosis of left posterior cerebral artery: Secondary | ICD-10-CM | POA: Diagnosis not present

## 2016-06-26 DIAGNOSIS — I480 Paroxysmal atrial fibrillation: Secondary | ICD-10-CM | POA: Diagnosis not present

## 2016-06-26 DIAGNOSIS — G8929 Other chronic pain: Secondary | ICD-10-CM | POA: Diagnosis not present

## 2016-06-26 DIAGNOSIS — Z79899 Other long term (current) drug therapy: Secondary | ICD-10-CM | POA: Diagnosis not present

## 2016-06-26 DIAGNOSIS — Z90710 Acquired absence of both cervix and uterus: Secondary | ICD-10-CM

## 2016-06-26 DIAGNOSIS — I69312 Visuospatial deficit and spatial neglect following cerebral infarction: Secondary | ICD-10-CM | POA: Diagnosis not present

## 2016-06-26 DIAGNOSIS — Z800 Family history of malignant neoplasm of digestive organs: Secondary | ICD-10-CM | POA: Diagnosis not present

## 2016-06-26 DIAGNOSIS — M792 Neuralgia and neuritis, unspecified: Secondary | ICD-10-CM | POA: Diagnosis not present

## 2016-06-26 DIAGNOSIS — I638 Other cerebral infarction: Secondary | ICD-10-CM | POA: Diagnosis not present

## 2016-06-26 DIAGNOSIS — E785 Hyperlipidemia, unspecified: Secondary | ICD-10-CM | POA: Diagnosis present

## 2016-06-26 DIAGNOSIS — Z823 Family history of stroke: Secondary | ICD-10-CM | POA: Diagnosis not present

## 2016-06-26 DIAGNOSIS — R410 Disorientation, unspecified: Secondary | ICD-10-CM | POA: Diagnosis not present

## 2016-06-26 DIAGNOSIS — Z79890 Hormone replacement therapy (postmenopausal): Secondary | ICD-10-CM

## 2016-06-26 DIAGNOSIS — I63532 Cerebral infarction due to unspecified occlusion or stenosis of left posterior cerebral artery: Secondary | ICD-10-CM

## 2016-06-26 DIAGNOSIS — Z8673 Personal history of transient ischemic attack (TIA), and cerebral infarction without residual deficits: Secondary | ICD-10-CM | POA: Diagnosis not present

## 2016-06-26 DIAGNOSIS — Z7901 Long term (current) use of anticoagulants: Secondary | ICD-10-CM

## 2016-06-26 DIAGNOSIS — F411 Generalized anxiety disorder: Secondary | ICD-10-CM | POA: Diagnosis present

## 2016-06-26 DIAGNOSIS — I4821 Permanent atrial fibrillation: Secondary | ICD-10-CM | POA: Diagnosis present

## 2016-06-26 DIAGNOSIS — I6789 Other cerebrovascular disease: Secondary | ICD-10-CM | POA: Diagnosis not present

## 2016-06-26 DIAGNOSIS — I63432 Cerebral infarction due to embolism of left posterior cerebral artery: Principal | ICD-10-CM | POA: Diagnosis present

## 2016-06-26 DIAGNOSIS — I482 Chronic atrial fibrillation: Secondary | ICD-10-CM | POA: Diagnosis present

## 2016-06-26 DIAGNOSIS — R262 Difficulty in walking, not elsewhere classified: Secondary | ICD-10-CM | POA: Diagnosis not present

## 2016-06-26 DIAGNOSIS — K5900 Constipation, unspecified: Secondary | ICD-10-CM | POA: Diagnosis not present

## 2016-06-26 DIAGNOSIS — R278 Other lack of coordination: Secondary | ICD-10-CM | POA: Diagnosis not present

## 2016-06-26 DIAGNOSIS — R51 Headache: Secondary | ICD-10-CM | POA: Diagnosis not present

## 2016-06-26 LAB — URINALYSIS, ROUTINE W REFLEX MICROSCOPIC
Bilirubin Urine: NEGATIVE
Glucose, UA: NEGATIVE mg/dL
Ketones, ur: NEGATIVE mg/dL
Nitrite: POSITIVE — AB
Protein, ur: NEGATIVE mg/dL
Specific Gravity, Urine: 1.005 (ref 1.005–1.030)
pH: 6 (ref 5.0–8.0)

## 2016-06-26 LAB — CBC
HCT: 40.3 % (ref 36.0–46.0)
Hemoglobin: 13.1 g/dL (ref 12.0–15.0)
MCH: 29.4 pg (ref 26.0–34.0)
MCHC: 32.5 g/dL (ref 30.0–36.0)
MCV: 90.4 fL (ref 78.0–100.0)
Platelets: 246 10*3/uL (ref 150–400)
RBC: 4.46 MIL/uL (ref 3.87–5.11)
RDW: 13.8 % (ref 11.5–15.5)
WBC: 8.6 10*3/uL (ref 4.0–10.5)

## 2016-06-26 LAB — COMPREHENSIVE METABOLIC PANEL
ALT: 20 U/L (ref 14–54)
AST: 23 U/L (ref 15–41)
Albumin: 4.3 g/dL (ref 3.5–5.0)
Alkaline Phosphatase: 46 U/L (ref 38–126)
Anion gap: 9 (ref 5–15)
BUN: 7 mg/dL (ref 6–20)
CO2: 29 mmol/L (ref 22–32)
Calcium: 9.7 mg/dL (ref 8.9–10.3)
Chloride: 100 mmol/L — ABNORMAL LOW (ref 101–111)
Creatinine, Ser: 0.64 mg/dL (ref 0.44–1.00)
GFR calc Af Amer: >60 mL/min (ref >60)
GFR calc non Af Amer: >60 mL/min (ref >60)
Glucose, Bld: 94 mg/dL (ref 65–99)
Potassium: 3.9 mmol/L (ref 3.5–5.1)
Sodium: 138 mmol/L (ref 135–145)
Total Bilirubin: 0.9 mg/dL (ref 0.3–1.2)
Total Protein: 7.5 g/dL (ref 6.5–8.1)

## 2016-06-26 LAB — PROTIME-INR
INR: 1.44
Prothrombin Time: 17.7 seconds — ABNORMAL HIGH (ref 11.4–15.2)

## 2016-06-26 LAB — APTT: aPTT: 33 seconds (ref 24–36)

## 2016-06-26 LAB — I-STAT TROPONIN, ED: Troponin i, poc: 0 ng/mL (ref 0.00–0.08)

## 2016-06-26 MED ORDER — HYDROCODONE-ACETAMINOPHEN 5-325 MG PO TABS: 1.0000 | ORAL_TABLET | ORAL | Status: DC | PRN

## 2016-06-26 MED ORDER — RIVAROXABAN 20 MG PO TABS: 20.0000 mg | ORAL_TABLET | Freq: Every day | ORAL | Status: DC

## 2016-06-26 MED ORDER — HYDROCHLOROTHIAZIDE 25 MG PO TABS
12.5000 mg | ORAL_TABLET | Freq: Every day | ORAL | Status: DC
  Administered 2016-06-27 (×2): 12.5 mg via ORAL
  Filled 2016-06-26 (×2): qty 1

## 2016-06-26 MED ORDER — ASPIRIN 325 MG PO TABS
325.0000 mg | ORAL_TABLET | Freq: Every day | ORAL | Status: DC
  Administered 2016-06-27: 325 mg via ORAL
  Filled 2016-06-26: qty 1

## 2016-06-26 MED ORDER — GABAPENTIN 300 MG PO CAPS: 300.0000 mg | ORAL_CAPSULE | Freq: Two times a day (BID) | ORAL | Status: DC

## 2016-06-26 MED ORDER — DIPHENHYDRAMINE HCL 50 MG/ML IJ SOLN
25.0000 mg | Freq: Once | INTRAMUSCULAR | Status: AC
  Administered 2016-06-26: 25 mg via INTRAVENOUS
  Filled 2016-06-26: qty 1

## 2016-06-26 MED ORDER — CIPROFLOXACIN IN D5W 400 MG/200ML IV SOLN
400.0000 mg | Freq: Once | INTRAVENOUS | Status: AC
  Administered 2016-06-27: 400 mg via INTRAVENOUS
  Filled 2016-06-26: qty 200

## 2016-06-26 MED ORDER — STROKE: EARLY STAGES OF RECOVERY BOOK
Freq: Once | Status: AC
  Administered 2016-06-27
  Filled 2016-06-26: qty 1

## 2016-06-26 MED ORDER — GABAPENTIN 300 MG PO CAPS
300.0000 mg | ORAL_CAPSULE | Freq: Every day | ORAL | Status: DC
  Administered 2016-06-27 – 2016-06-29 (×3): 300 mg via ORAL
  Filled 2016-06-26 (×3): qty 1

## 2016-06-26 MED ORDER — GADOBENATE DIMEGLUMINE 529 MG/ML IV SOLN
17.0000 mL | Freq: Once | INTRAVENOUS | Status: AC | PRN
  Administered 2016-06-26: 17 mL via INTRAVENOUS

## 2016-06-26 MED ORDER — CYCLOSPORINE 0.05 % OP EMUL: 1.0000 [drp] | Freq: Every day | OPHTHALMIC | Status: DC | PRN

## 2016-06-26 MED ORDER — SENNOSIDES-DOCUSATE SODIUM 8.6-50 MG PO TABS
1.0000 | ORAL_TABLET | Freq: Two times a day (BID) | ORAL | Status: DC
  Administered 2016-06-27 (×3): 1 via ORAL
  Filled 2016-06-26 (×3): qty 1

## 2016-06-26 MED ORDER — ACETAMINOPHEN 325 MG PO TABS: 650.0000 mg | ORAL_TABLET | ORAL | Status: DC | PRN

## 2016-06-26 MED ORDER — CIPROFLOXACIN IN D5W 400 MG/200ML IV SOLN: 400.0000 mg | INTRAVENOUS | Status: DC

## 2016-06-26 MED ORDER — ACETAMINOPHEN 160 MG/5ML PO SOLN: 650.0000 mg | ORAL | Status: DC | PRN

## 2016-06-26 MED ORDER — SODIUM CHLORIDE 0.9 % IV SOLN
INTRAVENOUS | Status: DC
  Administered 2016-06-27: via INTRAVENOUS

## 2016-06-26 MED ORDER — ACETAMINOPHEN 650 MG RE SUPP: 650.0000 mg | RECTAL | Status: DC | PRN

## 2016-06-26 MED ORDER — ASPIRIN EC 325 MG PO TBEC
325.0000 mg | DELAYED_RELEASE_TABLET | Freq: Once | ORAL | Status: AC
  Administered 2016-06-26: 325 mg via ORAL
  Filled 2016-06-26: qty 1

## 2016-06-26 MED ORDER — METOCLOPRAMIDE HCL 5 MG/ML IJ SOLN
10.0000 mg | Freq: Once | INTRAMUSCULAR | Status: AC
  Administered 2016-06-26: 10 mg via INTRAVENOUS
  Filled 2016-06-26: qty 2

## 2016-06-26 MED ORDER — GABAPENTIN 400 MG PO CAPS
400.0000 mg | ORAL_CAPSULE | Freq: Every day | ORAL | Status: DC
  Administered 2016-06-27 – 2016-06-28 (×3): 400 mg via ORAL
  Filled 2016-06-26 (×3): qty 1

## 2016-06-26 NOTE — ED Notes (Signed)
MRI called and is ready for patient.l

## 2016-06-26 NOTE — H&P (Addendum)
History and Physical    Samantha Bishop [id number] DOB: 1924/08/12 DOA: 06/26/2016  Referring MD/NP/PA: Dr. Melina Copa, Resident PCP: Hoyt Koch, MD  Patient coming from: Home  Chief Complaint: Headache  HPI: Samantha Bishop is a 83 y.o. right hand dominant  female with medical history significant of HTN, HLD, DVT/PE, PAF on chronic anticoagulation of Xarelto, and optic neuralgia; who presents with complaints of headache. Patient notes symptoms started approximately 2 days ago after placement of her hearing aids. Patient reported symptoms feeling similar to her optic neuralgia symptoms. She took gabapentin with some relief of symptoms. At baseline patient lives alone and is able to complete all of her ADLs including driving. However, yesterday she noticed significant difficulty keeping her balance and felt as though the room was spinning. Other associated symptoms include urinary frequency and change in her visual field especially with seeing things on her right side. Denies any fever, chills, abdominal pain, nausea, vomiting, diarrhea, falls, cough, shortness of breath, palpitation, leg swelling, or chest pain. Patient reports a past Xarelto on a pretty consistent basis, but does not usually take with a big meal.  ED Course: Upon admission into the emergency department patient was seen have a blood pressure as high as 193/129 in all other vital signs within normal limits. Lab work was relatively unremarkable. Urinalysis was positive for signs of infection. CT head revealed a subacute left PCA stroke. Neurology wasconsulted and recommended further workup including MRI/ MRA head without contrast and a MRA neck with and without contrast. [facility] called to admit.   Review of Systems: As per HPI otherwise 10 point review of systems negative.   Past Medical History:  Diagnosis Date  . Anxiety state, unspecified   . Calculus of kidney   . Cerebral aneurysm, nonruptured   . Colon polyp   .  Complication of anesthesia    "my heart stopped" 2004 knee  surg - see note on chart  . DEEP VENOUS THROMBOPHLEBITIS 08/15/2007   Qualifier: History of  By: Lenna Gilford MD, Deborra Medina   . Diverticulosis of colon (without mention of hemorrhage)   . History of skin cancer   . HTN (hypertension)   . Hyperlipidemia   . Lumbago   . Osteoarthrosis, unspecified whether generalized or localized, unspecified site   . Permanent atrial fibrillation ([place])   . Pulmonary emboli ([place])    following remote knee arthroscopy surgery  . Ruptured lumbar disc   . Sleep apnea    stop bang score 4   . Unspecified hemorrhoids without mention of complication     Past Surgical History:  Procedure Laterality Date  . ABDOMINAL HYSTERECTOMY    . APPENDECTOMY    . BLOCKED INTESTINE SURGERY    . BREAST CYST EXCISION    . HEMHORROIDECTOMY    . JOINT REPLACEMENT  2004   RT TOTAL KNEE  . KNEE ARTHROSCOPY     X2 L KNEE  . KNEE SURGERY    . NASAL SURGERY X2    . TONSILLECTOMY AND ADENOIDECTOMY    . TOTAL KNEE ARTHROPLASTY  09/03/2011   Procedure: TOTAL KNEE ARTHROPLASTY;  Surgeon: Gearlean Alf, MD;  Location: [facility];  Service: Orthopedics;  Laterality: Left;     reports that she has never smoked. She has never used smokeless tobacco. She reports that she drinks alcohol. She reports that she does not use drugs.  Allergies  Allergen Reactions  . Codeine     REACTION: causes nausea  . Penicillins  REACTION: rash    Family History  Problem Relation Age of Onset  . Coronary artery disease Father   . Stroke Father   . Cirrhosis Brother   . Colon cancer Son   . Colon polyps Neg Hx   . Kidney disease Neg Hx   . Diabetes Neg Hx     Prior to Admission medications   Medication Sig Start Date End Date Taking? Authorizing Provider  estradiol (CLIMARA - DOSED IN MG/24 HR) 0.05 mg/24hr patch Place 1 patch onto the skin once a week. Replaces every Saturday 05/29/16  Yes Historical Provider, MD  estradiol (ESTRACE)  0.1 MG/GM vaginal cream Place 1 Applicatorful vaginally 2 (two) times a week. 06/04/16  Yes Historical Provider, MD  gabapentin (NEURONTIN) 100 MG capsule TAKE 3 CAPSULES BY MOUTH IN THE MORNING AND 4 CAPSULES AT NIGHT 09/03/15  Yes Historical Provider, MD  hydrochlorothiazide (HYDRODIURIL) 25 MG tablet TAKE 1/2 TABLET BY MOUTH ONCE DAILY. Patient taking differently: TAKE 1/2 TABLET=12.5MG  BY MOUTH ONCE DAILY. 06/07/16  Yes Deboraha Sprang, MD  HYDROcodone-acetaminophen (NORCO) 5-325 MG tablet Take 1 tablet by mouth every 4 (four) hours as needed for moderate pain. [id number]  Yes Delora Fuel, MD  RESTASIS [id number] % ophthalmic emulsion Place 1 drop into both eyes daily as needed for dry eyes. 04/16/16  Yes Historical Provider, MD  sennosides-docusate sodium (SENOKOT-S) 8.6-50 MG tablet Take 1 tablet by mouth 2 (two) times daily. 01/24/15  Yes Hoyt Koch, MD  XARELTO 20 MG TABS tablet TAKE 1 TABLET (20 MG TOTAL) BY MOUTH DAILY WITH SUPPER. 02/16/16  Yes Deboraha Sprang, MD    Physical Exam:    Constitutional: Elderly female in NAD, calm, comfortable Vitals:   06/26/16 1600 06/26/16 1652 06/26/16 1700 06/26/16 1730  BP: 168/87  184/82 157/86  Pulse: 88  87 93  Resp: 19  14 15   Temp:  98.6 F (37 C)    SpO2: 94%  95% 96%   Eyes: PERRL, lids and conjunctivae normal ENMT: Mucous membranes are dry. Posterior pharynx clear of any exudate or lesions. Decreased hearing.   Neck: normal, supple, no masses, no thyromegaly Respiratory: clear to auscultation bilaterally, no wheezing, no crackles. Normal respiratory effort. No accessory muscle use.  Cardiovascular: Regular rate and rhythm, no murmurs / rubs / gallops. No extremity edema. 2+ pedal pulses. No carotid bruits.  Abdomen: no tenderness, no masses palpated. No hepatosplenomegaly. Bowel sounds positive.  Musculoskeletal: no clubbing / cyanosis. No joint deformity upper and lower extremities. Good ROM, no contractures. Normal muscle tone.  Skin:  no rashes, lesions, ulcers. No induration  Neurologic: CN 2-12 grossly intact. Sensation intact, DTR normal. Strength 5/5 in all 4.  right sided hemianopsia  Psychiatric: Normal judgment and insight. Alert and oriented x 3. Normal mood.     Labs on Admission: I have personally reviewed following labs and imaging studies  CBC:  Recent Labs Lab 06/26/16 1402  WBC 8.6  HGB 13.1  HCT 40.3  MCV 90.4  PLT 0000000   Basic Metabolic Panel:  Recent Labs Lab 06/26/16 1402  NA 138  K 3.9  CL 100*  CO2 29  GLUCOSE 94  BUN 7  CREATININE 0.64  CALCIUM 9.7   GFR: CrCl cannot be calculated (Unknown ideal weight.). Liver Function Tests:  Recent Labs Lab 06/26/16 1402  AST 23  ALT 20  ALKPHOS 46  BILITOT 0.9  PROT 7.5  ALBUMIN 4.3   No results for input(s): LIPASE, AMYLASE in the  last 168 hours. No results for input(s): AMMONIA in the last 168 hours. Coagulation Profile:  Recent Labs Lab 06/26/16 1613  INR 1.44   Cardiac Enzymes: No results for input(s): CKTOTAL, CKMB, CKMBINDEX, TROPONINI in the last 168 hours. BNP (last 3 results) No results for input(s): PROBNP in the last 8760 hours. HbA1C: No results for input(s): HGBA1C in the last 72 hours. CBG: No results for input(s): GLUCAP in the last 168 hours. Lipid Profile: No results for input(s): CHOL, HDL, LDLCALC, TRIG, CHOLHDL, LDLDIRECT in the last 72 hours. Thyroid Function Tests: No results for input(s): TSH, T4TOTAL, FREET4, T3FREE, THYROIDAB in the last 72 hours. Anemia Panel: No results for input(s): VITAMINB12, FOLATE, FERRITIN, TIBC, IRON, RETICCTPCT in the last 72 hours. Urine analysis:    Component Value Date/Time   COLORURINE YELLOW 06/26/2016 1536   APPEARANCEUR CLOUDY (A) 06/26/2016 1536   LABSPEC 1.005 06/26/2016 1536   PHURINE 6.0 06/26/2016 1536   GLUCOSEU NEGATIVE 06/26/2016 1536   HGBUR LARGE (A) 06/26/2016 1536   BILIRUBINUR NEGATIVE 06/26/2016 1536   KETONESUR NEGATIVE 06/26/2016 1536    PROTEINUR NEGATIVE 06/26/2016 1536   UROBILINOGEN 0.2 08/20/2011 1151   NITRITE POSITIVE (A) 06/26/2016 1536   LEUKOCYTESUR LARGE (A) 06/26/2016 1536   Sepsis Labs: No results found for this or any previous visit (from the past 240 hour(s)).   Radiological Exams on Admission: Ct Head Wo Contrast  Result Date: 06/26/2016 CLINICAL DATA:  Visual field deficit.  Confusion. EXAM: CT HEAD WITHOUT CONTRAST TECHNIQUE: Contiguous axial images were obtained from the base of the skull through the vertex without intravenous contrast. COMPARISON:  07/20/2013 FINDINGS: Brain: Acute to subacute ischemia identified medial left occipital lobe, consistent with PCA territory infarct on the left. Old right parieto-occipital infarct stable in appearance. Diffuse loss of parenchymal volume is consistent with atrophy. Patchy low attenuation in the deep hemispheric and periventricular white matter is nonspecific, but likely reflects chronic microvascular ischemic demyelination. Apparent lacunar infarct in the left aspect of the pons is stable. Vascular: Atherosclerotic calcification is visualized in the carotid arteries. No dense MCA sign. Major dural sinuses are unremarkable. Skull: Bone windows reveal no worrisome lytic or sclerotic osseous lesions. Sinuses/Orbits: Polypoid chronic mucosal disease left maxillary sinus. Remaining visualized paranasal sinuses and mastoid air cells are clear. Visualized portions of the globes and intraorbital fat are unremarkable. Other: None. IMPRESSION: 1. Acute to subacute left PCA territory infarct. MRI may prove helpful to assess acuity, as clinically warranted. No evidence for associated hemorrhage. 2. Stable appearance of old right parieto-occipital lobe infarct. 3. Chronic small vessel white matter ischemic disease. Critical Value/emergent results were called by me at the time of interpretation on 06/26/2016 at 6:10 pm to Dr. Charlena Cross , who verbally acknowledged these results.  Electronically Signed   By: Misty Stanley M.D.   On: 06/26/2016 18:10    EKG: Independently reviewed. Atrial fibrillation 90 bpm  Assessment/Plan Left PCA stroke with right-sided hemi-anopsia: Subacute. Symptoms started over the last 2 days patient reports dizziness and decreased ability seen on the right side. Left PCA stroke seen on CT. Neurology recommending further workup. - Admit to telemetry bed - Stroke order set initiated - Neuro checks - Check  MRI/MRA head w/o contrast and MRA neck w/ w/o contrast - Check echocardiogram - PT/OT/speech - Check Hemoglobin A1c and lipid panel in a.m. - [place] neurology consultative services, will follow-up  - Social work consult as patient recommended not to drive and currently lives alone.  Paroxysmal atrial fibrillation on chronic anticoagulation/ Hx of DVT/Pulmonary embolus: Currently rate controlled. Chadsvasc  Score= 7( hypertension, PE, stroke, sex, age). Patient followed by Dr. Caryl Comes - Holding Xarelto per neuro recommendation   Essential hypertension - Continue hydrochlorothiazide  Left ICA aneurysm. Last CTA of the head was 07/2013 was found to have 6 mm aneurysm.of the left supraclinoid ICA. - Follow-up MRA  Urinary tract infection: Acute.  - Follow-up urine culture - Ciprofloxacin IV per pharmacy   Postmenopausal hormone replacement: Patient reports being on estradiol and estradiol vaginal cream - Estrogen replacement currently not ordered due to his associated increased stroke and clot risk.. Would question neurology risk of continued estrogen replacement.    Optic neuralgia - Continue gabapentin  DVT prophylaxis: SCD   Code Status: full  Family Communication: Discussed wound care with the patient and family present at bedside Disposition Plan: To be determined as patient currently lives alone.  Consults called: Neurology Admission status: Inpatient  Norval Morton MD [facility] Pager [phone]  If 7PM-7AM, please contact night-coverage [email or web address] Password [facility]  06/26/2016, 7:18 PM

## 2016-06-26 NOTE — ED Triage Notes (Signed)
Pt. Stated, I got some hearing aids on Thursday and ever since then I've been confused and my head feeling crazy. Pt. Also took the hearing aids out and had the same symptoms. Daughter stated, she was incontinent of urine last night.

## 2016-06-26 NOTE — ED Notes (Signed)
Report given to [facility] RN, advised pt is going for chest xray and MRI and then will be transported to room at that time.

## 2016-06-26 NOTE — Consult Note (Signed)
Neurology Consultation Reason for Consult: Stroke Referring Physician: Kindl, J  CC: Vision changes  History is obtained from:Patient  HPI: Samantha Bishop is a 83 y.o. female with a history of atrial fibrillation who presents with new acute stroke. She states that she has not been feeling right since last Thursday, but really noticed vision problems more pronounced last night. Due to this, she presented to the emergency department today and had a CT of her head which demonstrates a left PCA territory infarct.  She also has been complaining about a left posterior headache which is been present since Thursday as well.  Of note, she consistently takes her Xarelto on a empty stomach.   LKW: Thursday tpa given?: no, out of window    ROS: A 14 point ROS was performed and is negative except as noted in the HPI.   Past Medical History:  Diagnosis Date  . Anxiety state, unspecified   . Calculus of kidney   . Cerebral aneurysm, nonruptured   . Colon polyp   . Complication of anesthesia    "my heart stopped" 2004 knee  surg - see note on chart  . DEEP VENOUS THROMBOPHLEBITIS 08/15/2007   Qualifier: History of  By: Lenna Gilford MD, Deborra Medina   . Diverticulosis of colon (without mention of hemorrhage)   . History of skin cancer   . HTN (hypertension)   . Hyperlipidemia   . Lumbago   . Osteoarthrosis, unspecified whether generalized or localized, unspecified site   . Permanent atrial fibrillation ([place])   . Pulmonary emboli ([place])    following remote knee arthroscopy surgery  . Ruptured lumbar disc   . Sleep apnea    stop bang score 4   . Unspecified hemorrhoids without mention of complication      Family History  Problem Relation Age of Onset  . Coronary artery disease Father   . Stroke Father   . Cirrhosis Brother   . Colon cancer Son   . Colon polyps Neg Hx   . Kidney disease Neg Hx   . Diabetes Neg Hx      Social History:  reports that she has never smoked. She has never used  smokeless tobacco. She reports that she drinks alcohol. She reports that she does not use drugs.   Exam: Current vital signs: BP 157/86   Pulse 93   Temp 98.6 F (37 C)   Resp 15   SpO2 96%  Vital signs in last 24 hours: Temp:  [98.6 F (37 C)-98.8 F (37.1 C)] 98.6 F (37 C) (02/17 1652) Pulse Rate:  [83-93] 93 (02/17 1730) Resp:  [14-20] 15 (02/17 1730) BP: (157-193)/(79-129) 157/86 (02/17 1730) SpO2:  [94 %-99 %] 96 % (02/17 1730)   Physical Exam  Constitutional: Appears well-developed and well-nourished.  Psych: Affect appropriate to situation Eyes: No scleral injection HENT: No OP obstrucion Head: Normocephalic.  Cardiovascular: Normal rate and regular rhythm.  Respiratory: Effort normal and breath sounds normal to anterior ascultation GI: Soft.  No distension. There is no tenderness.  Skin: WDI  Neuro: Mental Status: Patient is awake, alert, she is interactive and appropriate Patient is able to give a clear and coherent history. No signs of aphasia or neglect She is able to read Cranial Nerves: II: Dense right hemianopia. Pupils are equal, round, and reactive to light.   III,IV, VI: EOMI without ptosis or diploplia.  V: Facial sensation is symmetric to temperature VII: Facial movement is symmetric.  VIII: hearing is intact to  voice X: Uvula elevates symmetrically XI: Shoulder shrug is symmetric. XII: tongue is midline without atrophy or fasciculations.  Motor: Tone is normal. Bulk is normal. 5/5 strength was present in all four extremities.  Sensory: Sensation is symmetric to light touch and temperature in the arms and legs. Cerebellar: FNF with mild tremor bilaterally   I have reviewed labs in epic and the results pertinent to this consultation are: CMP-unremarkable  I have reviewed the images obtained: CT head-left PCA infarct  Impression: 83 yo F with PCA infarct, possibiliteis include large vessel athero,  breakthrough stroke on xarelto, or  vertebral dissection. She will need further workup.   Recommendations: 1. HgbA1c, fasting lipid panel 2. MRI, MRA  of the brain without contrast 3. Frequent neuro checks 4. Echocardiogram 5. Prophylactic therapy-Antiplatelet med: Aspirin - dose 325mg  PO or 300mg  PR 6. Risk factor modification 7. Telemetry monitoring 8. PT consult, OT consult, Speech consult 9. please page stroke NP  Or  PA  Or MD  from 8am -4 pm starting 2/18 as this patient will be followed by the stroke team at this point.   You can look them up on [email or web address]     Roland Rack, MD [facility] [phone]  If 7pm- 7am, please page neurology on call as listed in [place].

## 2016-06-26 NOTE — Discharge Instructions (Signed)
Go directly to ER for further eval.

## 2016-06-26 NOTE — ED Notes (Signed)
Patient transported to CT 

## 2016-06-26 NOTE — ED Notes (Signed)
Patient transported to MRI 

## 2016-06-26 NOTE — ED Triage Notes (Addendum)
Pt  Reports    She    Had   Hearing    Placed     2  Days  Ago        And   Since  Then   She  Has  Been   Dizzy      Headache      Mainly   Blurred   Vision     Unsteady  Gait         Alert   And  Oriented   She   Was    Incontinent

## 2016-06-26 NOTE — ED Provider Notes (Signed)
[facility]    CSN: [id number] Arrival date & time: 06/26/16  1205     History   Chief Complaint Chief Complaint  Patient presents with  . Dizziness    HPI Samantha Bishop is a 83 y.o. female.   The history is provided by the patient and a relative.  Dizziness  Quality:  Room spinning Severity:  Moderate (onset yest after new hearing aids .) Onset quality:  Sudden Duration:  2 days Progression:  Worsening (visual loss last eve and urinary incont  last eve.) Chronicity:  Recurrent Relieved by:  Being still Associated symptoms: headaches and vision changes   Associated symptoms: no palpitations   Risk factors comment:  Occipital neuralgia,   Past Medical History:  Diagnosis Date  . Anxiety state, unspecified   . Calculus of kidney   . Cerebral aneurysm, nonruptured   . Colon polyp   . Complication of anesthesia    "my heart stopped" 2004 knee  surg - see note on chart  . DEEP VENOUS THROMBOPHLEBITIS 08/15/2007   Qualifier: History of  By: Lenna Gilford MD, Deborra Medina   . Diverticulosis of colon (without mention of hemorrhage)   . History of skin cancer   . HTN (hypertension)   . Hyperlipidemia   . Lumbago   . Osteoarthrosis, unspecified whether generalized or localized, unspecified site   . Permanent atrial fibrillation ([place])   . Pulmonary emboli ([place])    following remote knee arthroscopy surgery  . Ruptured lumbar disc   . Sleep apnea    stop bang score 4   . Unspecified hemorrhoids without mention of complication     Patient Active Problem List   Diagnosis Date Noted  . Back pain 04/08/2015  . Abnormal CT scan 04/08/2015  . Constipation 01/02/2015  . Intracranial aneurysm 10/18/2014  . Routine general medical examination at a health care facility 03/08/2014  . Occipital neuralgia 07/16/2013  . Venous insufficiency 07/11/2013  . Alopecia 06/08/2012  . Osteopenia 02/21/2010  . ANXIETY 05/21/2009  . Essential hypertension 05/21/2009  . Atrial  fibrillation - followed by Dr. Caryl Comes 03/03/2009  . INTRACRANIAL ANEURYSM 08/16/2007  . Hyperlipidemia 08/15/2007  . GERD 08/15/2007    Past Surgical History:  Procedure Laterality Date  . ABDOMINAL HYSTERECTOMY    . APPENDECTOMY    . BLOCKED INTESTINE SURGERY    . BREAST CYST EXCISION    . HEMHORROIDECTOMY    . JOINT REPLACEMENT  2004   RT TOTAL KNEE  . KNEE ARTHROSCOPY     X2 L KNEE  . KNEE SURGERY    . NASAL SURGERY X2    . TONSILLECTOMY AND ADENOIDECTOMY    . TOTAL KNEE ARTHROPLASTY  09/03/2011   Procedure: TOTAL KNEE ARTHROPLASTY;  Surgeon: Gearlean Alf, MD;  Location: [facility];  Service: Orthopedics;  Laterality: Left;    OB History    No data available       Home Medications    Prior to Admission medications   Medication Sig Start Date End Date Taking? Authorizing Provider  gabapentin (NEURONTIN) 100 MG capsule TAKE 3 CAPSULES BY MOUTH IN THE MORNING AND 4 CAPSULES AT NIGHT 09/03/15   Historical Provider, MD  hydrochlorothiazide (HYDRODIURIL) 25 MG tablet TAKE 1/2 TABLET BY MOUTH ONCE DAILY. 06/07/16   Deboraha Sprang, MD  HYDROcodone-acetaminophen (NORCO) 5-325 MG tablet Take 1 tablet by mouth every 4 (four) hours as needed for moderate pain. [id number]   Delora Fuel, MD  sennosides-docusate sodium (SENOKOT-S) 8.6-50 MG  tablet Take 1 tablet by mouth 2 (two) times daily. 01/24/15   Hoyt Koch, MD  XARELTO 20 MG TABS tablet TAKE 1 TABLET (20 MG TOTAL) BY MOUTH DAILY WITH SUPPER. 02/16/16   Deboraha Sprang, MD    Family History Family History  Problem Relation Age of Onset  . Coronary artery disease Father   . Stroke Father   . Cirrhosis Brother   . Colon cancer Son   . Colon polyps Neg Hx   . Kidney disease Neg Hx   . Diabetes Neg Hx     Social History Social History  Substance Use Topics  . Smoking status: Never Smoker  . Smokeless tobacco: Never Used  . Alcohol use 0.0 oz/week     Comment: Rarely     Allergies   Codeine and  Penicillins   Review of Systems Review of Systems  Cardiovascular: Negative for palpitations.  Neurological: Positive for dizziness and headaches.     Physical Exam Triage Vital Signs ED Triage Vitals  Enc Vitals Group     BP 06/26/16 1255 170/98     Pulse Rate 06/26/16 1255 93     Resp 06/26/16 1255 16     Temp 06/26/16 1255 98.6 F (37 C)     Temp Source 06/26/16 1255 Oral     SpO2 06/26/16 1255 95 %     Weight --      Height --      Head Circumference --      Peak Flow --      Pain Score 06/26/16 1303 5     Pain Loc --      Pain Edu? --      Excl. in [place]? --    No data found.   Updated Vital Signs BP 170/98 (BP Location: Right Arm) Comment: reported elevated BP to nurse Balinda Quails  Pulse 93   Temp 98.6 F (37 C) (Oral)   Resp 16   SpO2 95%   Visual Acuity Right Eye Distance:   Left Eye Distance:   Bilateral Distance:    Right Eye Near:   Left Eye Near:    Bilateral Near:     Physical Exam  Constitutional: She is oriented to person, place, and time. She appears well-developed and well-nourished. No distress.  HENT:  Right Ear: External ear normal.  Left Ear: External ear normal.  Nose: Nose normal.  Mouth/Throat: Oropharynx is clear and moist.  Eyes: EOM are normal. Pupils are equal, round, and reactive to light.  Neck: Normal range of motion. Neck supple.  Cardiovascular: Normal rate, regular rhythm, normal heart sounds and intact distal pulses.   Pulmonary/Chest: Effort normal and breath sounds normal.  Abdominal: Soft. Bowel sounds are normal.  Lymphadenopathy:    She has no cervical adenopathy.  Neurological: She is alert and oriented to person, place, and time.  Skin: Skin is warm and dry.  Nursing note and vitals reviewed.    UC Treatments / Results  Labs (all labs ordered are listed, but only abnormal results are displayed) Labs Reviewed - No data to display  EKG  EKG Interpretation None       Radiology No results  found.  Procedures Procedures (including critical care time)  Medications Ordered in UC Medications - No data to display   Initial Impression / Assessment and Plan / UC Course  I have reviewed the triage vital signs and the nursing notes.  Pertinent labs & imaging results that were available during  my care of the patient were reviewed by me and considered in my medical decision making (see chart for details).     Sent for neuro eval.  Final Clinical Impressions(s) / UC Diagnoses   Final diagnoses:  Dizziness    New Prescriptions New Prescriptions   No medications on file     Billy Fischer, MD 06/26/16 1331

## 2016-06-26 NOTE — ED Provider Notes (Signed)
[facility] DEPT Provider Note   CSN: [id number] Arrival date & time: 06/26/16  1341    History   Chief Complaint Chief Complaint  Patient presents with  . Altered Mental Status  . Headache    HPI Samantha Bishop is a 83 y.o. female.  The history is provided by the patient.  Headache   This is a recurrent problem. The current episode started 2 days ago. The problem occurs constantly. The problem has been gradually improving. Associated with: began after hearing aids placed on Thursday. The pain is located in the occipital region. The quality of the pain is described as dull. The pain is moderate. The pain does not radiate. Pertinent negatives include no fever, no palpitations, no shortness of breath and no vomiting. Treatments tried: Gabapentin. The treatment provided moderate relief.  Dizziness  Quality:  Imbalance Severity:  Moderate Onset quality:  Gradual Duration:  2 days Timing:  Constant Progression:  Worsening Chronicity:  New Context: standing up   Relieved by:  None tried Worsened by:  Nothing Ineffective treatments:  None tried Associated symptoms: headaches   Associated symptoms: no chest pain, no palpitations, no shortness of breath and no vomiting   Risk factors: no hx of stroke     Past Medical History:  Diagnosis Date  . Anxiety state, unspecified   . Calculus of kidney   . Cerebral aneurysm, nonruptured   . Colon polyp   . Complication of anesthesia    "my heart stopped" 2004 knee  surg - see note on chart  . DEEP VENOUS THROMBOPHLEBITIS 08/15/2007   Qualifier: History of  By: Lenna Gilford MD, Deborra Medina   . Diverticulosis of colon (without mention of hemorrhage)   . History of skin cancer   . HTN (hypertension)   . Hyperlipidemia   . Lumbago   . Osteoarthrosis, unspecified whether generalized or localized, unspecified site   . Permanent atrial fibrillation ([place])   . Pulmonary emboli ([place])    following remote knee arthroscopy surgery  . Ruptured lumbar  disc   . Sleep apnea    stop bang score 4   . Unspecified hemorrhoids without mention of complication     Patient Active Problem List   Diagnosis Date Noted  . CVA (cerebral vascular accident) ([place]) 06/26/2016  . Right homonymous hemianopsia 06/26/2016  . Back pain 04/08/2015  . Abnormal CT scan 04/08/2015  . Constipation 01/02/2015  . Intracranial aneurysm 10/18/2014  . Routine general medical examination at a health care facility 03/08/2014  . Occipital neuralgia 07/16/2013  . Venous insufficiency 07/11/2013  . Alopecia 06/08/2012  . Osteopenia 02/21/2010  . ANXIETY 05/21/2009  . Essential hypertension 05/21/2009  . Atrial fibrillation - followed by Dr. Caryl Comes 03/03/2009  . INTRACRANIAL ANEURYSM 08/16/2007  . Hyperlipidemia 08/15/2007  . GERD 08/15/2007    Past Surgical History:  Procedure Laterality Date  . ABDOMINAL HYSTERECTOMY    . APPENDECTOMY    . BLOCKED INTESTINE SURGERY    . BREAST CYST EXCISION    . HEMHORROIDECTOMY    . JOINT REPLACEMENT  2004   RT TOTAL KNEE  . KNEE ARTHROSCOPY     X2 L KNEE  . KNEE SURGERY    . NASAL SURGERY X2    . TONSILLECTOMY AND ADENOIDECTOMY    . TOTAL KNEE ARTHROPLASTY  09/03/2011   Procedure: TOTAL KNEE ARTHROPLASTY;  Surgeon: Gearlean Alf, MD;  Location: [facility];  Service: Orthopedics;  Laterality: Left;    OB History    No  data available       Home Medications    Prior to Admission medications   Medication Sig Start Date End Date Taking? Authorizing Provider  estradiol (CLIMARA - DOSED IN MG/24 HR) 0.05 mg/24hr patch Place 1 patch onto the skin once a week. Replaces every Saturday 05/29/16  Yes Historical Provider, MD  estradiol (ESTRACE) 0.1 MG/GM vaginal cream Place 1 Applicatorful vaginally 2 (two) times a week. 06/04/16  Yes Historical Provider, MD  gabapentin (NEURONTIN) 100 MG capsule TAKE 3 CAPSULES BY MOUTH IN THE MORNING AND 4 CAPSULES AT NIGHT 09/03/15  Yes Historical Provider, MD  hydrochlorothiazide  (HYDRODIURIL) 25 MG tablet TAKE 1/2 TABLET BY MOUTH ONCE DAILY. Patient taking differently: TAKE 1/2 TABLET=12.5MG  BY MOUTH ONCE DAILY. 06/07/16  Yes Deboraha Sprang, MD  HYDROcodone-acetaminophen (NORCO) 5-325 MG tablet Take 1 tablet by mouth every 4 (four) hours as needed for moderate pain. [id number]  Yes Delora Fuel, MD  RESTASIS [id number] % ophthalmic emulsion Place 1 drop into both eyes daily as needed for dry eyes. 04/16/16  Yes Historical Provider, MD  sennosides-docusate sodium (SENOKOT-S) 8.6-50 MG tablet Take 1 tablet by mouth 2 (two) times daily. 01/24/15  Yes Hoyt Koch, MD  XARELTO 20 MG TABS tablet TAKE 1 TABLET (20 MG TOTAL) BY MOUTH DAILY WITH SUPPER. 02/16/16  Yes Deboraha Sprang, MD    Family History Family History  Problem Relation Age of Onset  . Coronary artery disease Father   . Stroke Father   . Cirrhosis Brother   . Colon cancer Son   . Colon polyps Neg Hx   . Kidney disease Neg Hx   . Diabetes Neg Hx     Social History Social History  Substance Use Topics  . Smoking status: Never Smoker  . Smokeless tobacco: Never Used  . Alcohol use 0.0 oz/week     Comment: Rarely     Allergies   Codeine and Penicillins   Review of Systems Review of Systems  Constitutional: Negative for chills and fever.  HENT: Negative for ear pain and sore throat.   Eyes: Positive for visual disturbance. Negative for pain.  Respiratory: Negative for cough and shortness of breath.   Cardiovascular: Negative for chest pain and palpitations.  Gastrointestinal: Negative for abdominal pain and vomiting.  Genitourinary: Positive for difficulty urinating and frequency. Negative for dysuria and hematuria.  Musculoskeletal: Negative for arthralgias and back pain.  Skin: Negative for color change and rash.  Neurological: Positive for dizziness and headaches. Negative for seizures and syncope.  All other systems reviewed and are negative.    Physical Exam Updated Vital Signs BP (!)  148/103 (BP Location: Right Arm)   Pulse 86   Temp 98.6 F (37 C) (Oral)   Resp 18   Ht 5\' 6"  (1.676 m)   Wt 84.4 kg   SpO2 97%   BMI 30.03 kg/m   Physical Exam  Constitutional: She is oriented to person, place, and time. She appears well-developed and well-nourished. No distress.  HENT:  Head: Normocephalic and atraumatic.  Eyes: Conjunctivae are normal.  Neck: Neck supple.  Cardiovascular: Normal rate and regular rhythm.   No murmur heard. Pulmonary/Chest: Effort normal and breath sounds normal. No respiratory distress.  Abdominal: Soft. There is no tenderness.  Musculoskeletal: She exhibits no edema.  Neurological: She is alert and oriented to person, place, and time. A cranial nerve deficit is present. No sensory deficit. Coordination abnormal.  Strength 5/5 in all extremities. Sensation to light touch  intact throughout. Pt has bilateral right homonomous hemianopsia. Finger-to-nose testing indeterminate, likely due to visual field deficit. Heel-to-shin normal bilaterally.  Skin: Skin is warm and dry.  Psychiatric: She has a normal mood and affect.  Nursing note and vitals reviewed.    ED Treatments / Results  Labs (all labs ordered are listed, but only abnormal results are displayed) Labs Reviewed  COMPREHENSIVE METABOLIC PANEL - Abnormal; Notable for the following:       Result Value   Chloride 100 (*)    All other components within normal limits  PROTIME-INR - Abnormal; Notable for the following:    Prothrombin Time 17.7 (*)    All other components within normal limits  URINALYSIS, ROUTINE W REFLEX MICROSCOPIC - Abnormal; Notable for the following:    APPearance CLOUDY (*)    Hgb urine dipstick LARGE (*)    Nitrite POSITIVE (*)    Leukocytes, UA LARGE (*)    Bacteria, UA RARE (*)    Squamous Epithelial / LPF 0-5 (*)    All other components within normal limits  URINE CULTURE  CBC  APTT  HEMOGLOBIN A1C  LIPID PANEL  CBC  BASIC METABOLIC PANEL  I-STAT  TROPOININ, ED  CBG MONITORING, ED    EKG  EKG Interpretation None       Radiology Dg Chest 2 View  Result Date: 06/26/2016 CLINICAL DATA:  Stroke today. EXAM: CHEST  2 VIEW COMPARISON:  CT chest 03/21/2015.  Chest 03/20/2015 FINDINGS: Shallow inspiration. Mild cardiac enlargement. No pulmonary vascular congestion or edema. No blunting of costophrenic angles. No pneumothorax. Mediastinal contours appear intact. Calcified and tortuous aorta. Degenerative changes in the spine and shoulders. IMPRESSION: Cardiac enlargement. No evidence of active pulmonary disease. Aortic atherosclerosis. Electronically Signed   By: Lucienne Capers M.D.   On: 06/26/2016 22:40   Ct Head Wo Contrast  Result Date: 06/26/2016 CLINICAL DATA:  Visual field deficit.  Confusion. EXAM: CT HEAD WITHOUT CONTRAST TECHNIQUE: Contiguous axial images were obtained from the base of the skull through the vertex without intravenous contrast. COMPARISON:  07/20/2013 FINDINGS: Brain: Acute to subacute ischemia identified medial left occipital lobe, consistent with PCA territory infarct on the left. Old right parieto-occipital infarct stable in appearance. Diffuse loss of parenchymal volume is consistent with atrophy. Patchy low attenuation in the deep hemispheric and periventricular white matter is nonspecific, but likely reflects chronic microvascular ischemic demyelination. Apparent lacunar infarct in the left aspect of the pons is stable. Vascular: Atherosclerotic calcification is visualized in the carotid arteries. No dense MCA sign. Major dural sinuses are unremarkable. Skull: Bone windows reveal no worrisome lytic or sclerotic osseous lesions. Sinuses/Orbits: Polypoid chronic mucosal disease left maxillary sinus. Remaining visualized paranasal sinuses and mastoid air cells are clear. Visualized portions of the globes and intraorbital fat are unremarkable. Other: None. IMPRESSION: 1. Acute to subacute left PCA territory infarct. MRI  may prove helpful to assess acuity, as clinically warranted. No evidence for associated hemorrhage. 2. Stable appearance of old right parieto-occipital lobe infarct. 3. Chronic small vessel white matter ischemic disease. Critical Value/emergent results were called by me at the time of interpretation on 06/26/2016 at 6:10 pm to Dr. Charlena Cross , who verbally acknowledged these results. Electronically Signed   By: Misty Stanley M.D.   On: 06/26/2016 18:10   Mr Jodene Nam Neck W Wo Contrast  Result Date: 06/26/2016 CLINICAL DATA:  Initial evaluation for acute stroke. EXAM: MRI HEAD WITHOUT CONTRAST MRA HEAD WITHOUT CONTRAST MRA NECK WITHOUT AND WITH CONTRAST  TECHNIQUE: Multiplanar, multiecho pulse sequences of the brain and surrounding structures were obtained without intravenous contrast. Angiographic images of the Circle of Willis were obtained using MRA technique without intravenous contrast. Angiographic images of the neck were obtained using MRA technique without and with intravenous contrast. Carotid stenosis measurements (when applicable) are obtained utilizing NASCET criteria, using the distal internal carotid diameter as the denominator. CONTRAST:  35mL MULTIHANCE GADOBENATE DIMEGLUMINE 529 MG/ML IV SOLN COMPARISON:  Comparison made with prior CT from earlier the same day. FINDINGS: MRI HEAD FINDINGS Generalized age-related cerebral atrophy present. Patchy and confluent T2/FLAIR hyperintensity within the periventricular deep white matter both cerebral hemispheres, most consistent with chronic microvascular ischemic disease. Chronic microvascular ischemic changes present within the pons as well. Encephalomalacia within the right parietal lobe compatible with remote ischemic infarct. Additional small remote infarct noted within the parasagittal left cerebellar hemisphere. Confluent restricted diffusion within the parasagittal left occipital lobe, compatible with acute left PCA territory infarct. No significant mass  effect. Associated petechial hemorrhage within the peripheral aspect of this infarct without frank hemorrhagic transformation. No other evidence for acute or subacute ischemia. Gray-white matter differentiation otherwise maintained. No mass lesion, midline shift or mass effect. No hydrocephalus. No extra-axial fluid collection. Major dural sinuses are grossly patent. Pituitary gland and suprasellar region within normal limits. Major intracranial vascular flow voids are maintained. Craniocervical junction within normal limits. Visualized upper cervical spine unremarkable. Bone marrow signal intensity within normal limits. No scalp soft tissue abnormality. Globes and orbital soft tissues within normal limits. Patient is status post lens extraction bilaterally. Scattered mucosal thickening within the ethmoidal air cells. Retention cyst noted within the left maxillary sinus. Paranasal sinuses are otherwise clear. No mastoid effusion. Inner ear structures grossly normal. MRA HEAD FINDINGS ANTERIOR CIRCULATION: Distal cervical segments of the internal carotid arteries are patent with antegrade flow. Petrous, cavernous, and supraclinoid segments widely patent bilaterally without flow-limiting stenosis. 5 mm saccular aneurysm arises from the cavernous/ supraclinoid left ICA (series 5, image 98). Additional tiny 2 mm focal outpouching arising at the takeoff of the left P com may reflect an additional small aneurysm or vascular infundibulum (series 5, image 85). A1 segments patent. Probable additional 2 mm aneurysm arising from the anterior communicating artery the the (series 5, image 71). Anterior communicating arteries widely patent distally. M1 segments patent without stenosis or occlusion. Left MCA bifurcates early. Distal MCA branches well opacified and symmetric. POSTERIOR CIRCULATION: Vertebral arteries patent to the vertebrobasilar junction without significant stenosis. Right vertebral artery is dominant, with  slightly diminutive left vertebral artery. No findings to suggest dissection. Posterior inferior cerebral arteries patent proximally. Basilar artery widely patent. Superior cerebral arteries patent bilaterally. Hypoplastic right P1 segment with prominent widely patent right posterior communicating artery. Left PCA largely supplied via the basilar artery. Right PCA widely patent to its distal aspect. Left PCA is attenuated distally, likely occluded given the left PCA territory infarct. MRA NECK FINDINGS Visualized aortic arch of normal caliber with normal 3 vessel morphology. No high-grade stenosis seen at the origin of the great vessels. Partially visualized subclavian arteries widely patent. Right common carotid artery widely patent from its origin to the bifurcation. Mild atheromatous irregularity about the right bifurcation/proximal right ICA with associated mild stenosis of the proximal right ICA. Right ICA patent distally to the skullbase without stenosis or occlusion. Left common carotid artery patent from its origin to the bifurcation. Minimal atheromatous irregularity about the left bifurcation without significant narrowing. Left ICA patent distally to the skullbase without  stenosis or occlusion. Both of the vertebral arteries arise from the subclavian arteries. Right vertebral artery slightly dominant. Vertebral arteries are tortuous proximally. Vertebral arteries patent within the neck without significant stenosis or occlusion. No obvious evidence for dissection. Atheromatous irregularity with tortuosity noted within the distal left V2 segment. IMPRESSION: MRI HEAD IMPRESSION: 1. Acute moderate sized left PCA territory infarct. Associated petechial hemorrhage without significant mass effect. 2. Remote right parietal and left cerebellar infarcts as above. 3. Generalized age-related cerebral atrophy with moderate chronic microvascular ischemic disease. MRA HEAD IMPRESSION: 1. Attenuation of the distal left  PCA, likely occluded given the acute left PCA territory infarct (left P3 segment). 2. Otherwise widely patent anterior and posterior circulation. No high-grade or correctable stenosis. 3. 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 Additional 2 mm focal outpouching arising at the takeoff of the left P com may reflect a small aneurysm or possibly vascular infundibulum. MRA NECK IMPRESSION: 1. Short-segment mild atheromatous narrowing at the proximal right ICA. Otherwise widely patent right carotid artery system. 2. No significant atheromatous disease or luminal narrowing within the left carotid artery system. 3. Widely patent vertebral arteries within the neck. Electronically Signed   By: Jeannine Boga M.D.   On: 06/26/2016 23:43   Mr Brain Wo Contrast  Result Date: 06/26/2016 CLINICAL DATA:  Initial evaluation for acute stroke. EXAM: MRI HEAD WITHOUT CONTRAST MRA HEAD WITHOUT CONTRAST MRA NECK WITHOUT AND WITH CONTRAST TECHNIQUE: Multiplanar, multiecho pulse sequences of the brain and surrounding structures were obtained without intravenous contrast. Angiographic images of the Circle of Willis were obtained using MRA technique without intravenous contrast. Angiographic images of the neck were obtained using MRA technique without and with intravenous contrast. Carotid stenosis measurements (when applicable) are obtained utilizing NASCET criteria, using the distal internal carotid diameter as the denominator. CONTRAST:  41mL MULTIHANCE GADOBENATE DIMEGLUMINE 529 MG/ML IV SOLN COMPARISON:  Comparison made with prior CT from earlier the same day. FINDINGS: MRI HEAD FINDINGS Generalized age-related cerebral atrophy present. Patchy and confluent T2/FLAIR hyperintensity within the periventricular deep white matter both cerebral hemispheres, most consistent with chronic microvascular ischemic disease. Chronic microvascular  ischemic changes present within the pons as well. Encephalomalacia within the right parietal lobe compatible with remote ischemic infarct. Additional small remote infarct noted within the parasagittal left cerebellar hemisphere. Confluent restricted diffusion within the parasagittal left occipital lobe, compatible with acute left PCA territory infarct. No significant mass effect. Associated petechial hemorrhage within the peripheral aspect of this infarct without frank hemorrhagic transformation. No other evidence for acute or subacute ischemia. Gray-white matter differentiation otherwise maintained. No mass lesion, midline shift or mass effect. No hydrocephalus. No extra-axial fluid collection. Major dural sinuses are grossly patent. Pituitary gland and suprasellar region within normal limits. Major intracranial vascular flow voids are maintained. Craniocervical junction within normal limits. Visualized upper cervical spine unremarkable. Bone marrow signal intensity within normal limits. No scalp soft tissue abnormality. Globes and orbital soft tissues within normal limits. Patient is status post lens extraction bilaterally. Scattered mucosal thickening within the ethmoidal air cells. Retention cyst noted within the left maxillary sinus. Paranasal sinuses are otherwise clear. No mastoid effusion. Inner ear structures grossly normal. MRA HEAD FINDINGS ANTERIOR CIRCULATION: Distal cervical segments of the internal carotid arteries are patent with antegrade flow. Petrous, cavernous, and supraclinoid segments widely patent bilaterally without flow-limiting stenosis. 5 mm saccular aneurysm arises from the cavernous/ supraclinoid left ICA (series 5, image 98). Additional  tiny 2 mm focal outpouching arising at the takeoff of the left P com may reflect an additional small aneurysm or vascular infundibulum (series 5, image 85). A1 segments patent. Probable additional 2 mm aneurysm arising from the anterior communicating  artery the the (series 5, image 71). Anterior communicating arteries widely patent distally. M1 segments patent without stenosis or occlusion. Left MCA bifurcates early. Distal MCA branches well opacified and symmetric. POSTERIOR CIRCULATION: Vertebral arteries patent to the vertebrobasilar junction without significant stenosis. Right vertebral artery is dominant, with slightly diminutive left vertebral artery. No findings to suggest dissection. Posterior inferior cerebral arteries patent proximally. Basilar artery widely patent. Superior cerebral arteries patent bilaterally. Hypoplastic right P1 segment with prominent widely patent right posterior communicating artery. Left PCA largely supplied via the basilar artery. Right PCA widely patent to its distal aspect. Left PCA is attenuated distally, likely occluded given the left PCA territory infarct. MRA NECK FINDINGS Visualized aortic arch of normal caliber with normal 3 vessel morphology. No high-grade stenosis seen at the origin of the great vessels. Partially visualized subclavian arteries widely patent. Right common carotid artery widely patent from its origin to the bifurcation. Mild atheromatous irregularity about the right bifurcation/proximal right ICA with associated mild stenosis of the proximal right ICA. Right ICA patent distally to the skullbase without stenosis or occlusion. Left common carotid artery patent from its origin to the bifurcation. Minimal atheromatous irregularity about the left bifurcation without significant narrowing. Left ICA patent distally to the skullbase without stenosis or occlusion. Both of the vertebral arteries arise from the subclavian arteries. Right vertebral artery slightly dominant. Vertebral arteries are tortuous proximally. Vertebral arteries patent within the neck without significant stenosis or occlusion. No obvious evidence for dissection. Atheromatous irregularity with tortuosity noted within the distal left V2  segment. IMPRESSION: MRI HEAD IMPRESSION: 1. Acute moderate sized left PCA territory infarct. Associated petechial hemorrhage without significant mass effect. 2. Remote right parietal and left cerebellar infarcts as above. 3. Generalized age-related cerebral atrophy with moderate chronic microvascular ischemic disease. MRA HEAD IMPRESSION: 1. Attenuation of the distal left PCA, likely occluded given the acute left PCA territory infarct (left P3 segment). 2. Otherwise widely patent anterior and posterior circulation. No high-grade or correctable stenosis. 3. 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 Additional 2 mm focal outpouching arising at the takeoff of the left P com may reflect a small aneurysm or possibly vascular infundibulum. MRA NECK IMPRESSION: 1. Short-segment mild atheromatous narrowing at the proximal right ICA. Otherwise widely patent right carotid artery system. 2. No significant atheromatous disease or luminal narrowing within the left carotid artery system. 3. Widely patent vertebral arteries within the neck. Electronically Signed   By: Jeannine Boga M.D.   On: 06/26/2016 23:43   Mr Jodene Nam Head/brain [id number] Cm  Result Date: 06/26/2016 CLINICAL DATA:  Initial evaluation for acute stroke. EXAM: MRI HEAD WITHOUT CONTRAST MRA HEAD WITHOUT CONTRAST MRA NECK WITHOUT AND WITH CONTRAST TECHNIQUE: Multiplanar, multiecho pulse sequences of the brain and surrounding structures were obtained without intravenous contrast. Angiographic images of the Circle of Willis were obtained using MRA technique without intravenous contrast. Angiographic images of the neck were obtained using MRA technique without and with intravenous contrast. Carotid stenosis measurements (when applicable) are obtained utilizing NASCET criteria, using the distal internal carotid diameter as the denominator. CONTRAST:  67mL MULTIHANCE GADOBENATE  DIMEGLUMINE 529 MG/ML IV SOLN COMPARISON:  Comparison made with prior CT from earlier  the same day. FINDINGS: MRI HEAD FINDINGS Generalized age-related cerebral atrophy present. Patchy and confluent T2/FLAIR hyperintensity within the periventricular deep white matter both cerebral hemispheres, most consistent with chronic microvascular ischemic disease. Chronic microvascular ischemic changes present within the pons as well. Encephalomalacia within the right parietal lobe compatible with remote ischemic infarct. Additional small remote infarct noted within the parasagittal left cerebellar hemisphere. Confluent restricted diffusion within the parasagittal left occipital lobe, compatible with acute left PCA territory infarct. No significant mass effect. Associated petechial hemorrhage within the peripheral aspect of this infarct without frank hemorrhagic transformation. No other evidence for acute or subacute ischemia. Gray-white matter differentiation otherwise maintained. No mass lesion, midline shift or mass effect. No hydrocephalus. No extra-axial fluid collection. Major dural sinuses are grossly patent. Pituitary gland and suprasellar region within normal limits. Major intracranial vascular flow voids are maintained. Craniocervical junction within normal limits. Visualized upper cervical spine unremarkable. Bone marrow signal intensity within normal limits. No scalp soft tissue abnormality. Globes and orbital soft tissues within normal limits. Patient is status post lens extraction bilaterally. Scattered mucosal thickening within the ethmoidal air cells. Retention cyst noted within the left maxillary sinus. Paranasal sinuses are otherwise clear. No mastoid effusion. Inner ear structures grossly normal. MRA HEAD FINDINGS ANTERIOR CIRCULATION: Distal cervical segments of the internal carotid arteries are patent with antegrade flow. Petrous, cavernous, and supraclinoid segments widely patent bilaterally without  flow-limiting stenosis. 5 mm saccular aneurysm arises from the cavernous/ supraclinoid left ICA (series 5, image 98). Additional tiny 2 mm focal outpouching arising at the takeoff of the left P com may reflect an additional small aneurysm or vascular infundibulum (series 5, image 85). A1 segments patent. Probable additional 2 mm aneurysm arising from the anterior communicating artery the the (series 5, image 71). Anterior communicating arteries widely patent distally. M1 segments patent without stenosis or occlusion. Left MCA bifurcates early. Distal MCA branches well opacified and symmetric. POSTERIOR CIRCULATION: Vertebral arteries patent to the vertebrobasilar junction without significant stenosis. Right vertebral artery is dominant, with slightly diminutive left vertebral artery. No findings to suggest dissection. Posterior inferior cerebral arteries patent proximally. Basilar artery widely patent. Superior cerebral arteries patent bilaterally. Hypoplastic right P1 segment with prominent widely patent right posterior communicating artery. Left PCA largely supplied via the basilar artery. Right PCA widely patent to its distal aspect. Left PCA is attenuated distally, likely occluded given the left PCA territory infarct. MRA NECK FINDINGS Visualized aortic arch of normal caliber with normal 3 vessel morphology. No high-grade stenosis seen at the origin of the great vessels. Partially visualized subclavian arteries widely patent. Right common carotid artery widely patent from its origin to the bifurcation. Mild atheromatous irregularity about the right bifurcation/proximal right ICA with associated mild stenosis of the proximal right ICA. Right ICA patent distally to the skullbase without stenosis or occlusion. Left common carotid artery patent from its origin to the bifurcation. Minimal atheromatous irregularity about the left bifurcation without significant narrowing. Left ICA patent distally to the skullbase  without stenosis or occlusion. Both of the vertebral arteries arise from the subclavian arteries. Right vertebral artery slightly dominant. Vertebral arteries are tortuous proximally. Vertebral arteries patent within the neck without significant stenosis or occlusion. No obvious evidence for dissection. Atheromatous irregularity with tortuosity noted within the distal left V2 segment. IMPRESSION: MRI HEAD IMPRESSION: 1. Acute moderate sized left PCA territory infarct. Associated petechial hemorrhage without significant mass effect. 2. Remote right parietal and left cerebellar infarcts as above. 3. Generalized age-related cerebral atrophy  with moderate chronic microvascular ischemic disease. MRA HEAD IMPRESSION: 1. Attenuation of the distal left PCA, likely occluded given the acute left PCA territory infarct (left P3 segment). 2. Otherwise widely patent anterior and posterior circulation. No high-grade or correctable stenosis. 3. 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 Additional 2 mm focal outpouching arising at the takeoff of the left P com may reflect a small aneurysm or possibly vascular infundibulum. MRA NECK IMPRESSION: 1. Short-segment mild atheromatous narrowing at the proximal right ICA. Otherwise widely patent right carotid artery system. 2. No significant atheromatous disease or luminal narrowing within the left carotid artery system. 3. Widely patent vertebral arteries within the neck. Electronically Signed   By: Jeannine Boga M.D.   On: 06/26/2016 23:43    Procedures Procedures (including critical care time)  Medications Ordered in ED Medications  cycloSPORINE (RESTASIS) 0.05 % ophthalmic emulsion 1 drop (not administered)  hydrochlorothiazide (HYDRODIURIL) tablet 12.5 mg (not administered)  HYDROcodone-acetaminophen (NORCO/VICODIN) 5-325 MG per tablet 1 tablet (not administered)    senna-docusate (Senokot-S) tablet 1 tablet (not administered)   stroke: mapping our early stages of recovery book (not administered)  0.9 %  sodium chloride infusion (not administered)  acetaminophen (TYLENOL) tablet 650 mg (not administered)    Or  acetaminophen (TYLENOL) solution 650 mg (not administered)    Or  acetaminophen (TYLENOL) suppository 650 mg (not administered)  aspirin tablet 325 mg (not administered)  ciprofloxacin (CIPRO) IVPB 400 mg (not administered)  ciprofloxacin (CIPRO) IVPB 400 mg (not administered)  gabapentin (NEURONTIN) capsule 300 mg (not administered)    And  gabapentin (NEURONTIN) capsule 400 mg (not administered)  aspirin EC tablet 325 mg (325 mg Oral Given 06/26/16 1904)  metoCLOPramide (REGLAN) injection 10 mg (10 mg Intravenous Given 06/26/16 1904)  diphenhydrAMINE (BENADRYL) injection 25 mg (25 mg Intravenous Given 06/26/16 1903)  gadobenate dimeglumine (MULTIHANCE) injection 17 mL (17 mLs Intravenous Contrast Given 06/26/16 2255)     Initial Impression / Assessment and Plan / ED Course  I have reviewed the triage vital signs and the nursing notes.  Pertinent labs & imaging results that were available during my care of the patient were reviewed by me and considered in my medical decision making (see chart for details).    Pt with hx of HTN, PE (on Xarelto), known left cavernous carotid aneurysm, and diverticulosis who p/w headache and dizziness for 2 days. Pt initially had headache after hearing aids were placed on Thursday. She states it felt similar to her prior occipital neuralgia. Yesterday evening, she began having imbalance and discoordination. She was having vision changes and difficulty picking up her drink (both arms affected). She endorses generalized weakness but it sounds as if it is more related to discoordination. Headache improved with Gabapentin. Here, pt has right homonomous hemianopsia. CT head showed acute to sub-acute Left PCA infarct.  This is c/w pt's neuro deficit here. Neuro consulted who recommended admission to medicine for further workup. Pt admitted to hospitalist.  Final Clinical Impressions(s) / ED Diagnoses   Final diagnoses:  CVA (cerebral vascular accident) [facility])  CVA (cerebral vascular accident) [facility])  CVA (cerebral vascular accident) [facility])    New Prescriptions Current Discharge Medication List       Clifton James, MD 06/26/16 2346    Veryl Speak, MD 06/27/16 1549

## 2016-06-27 ENCOUNTER — Inpatient Hospital Stay (HOSPITAL_COMMUNITY): Payer: Medicare Other

## 2016-06-27 DIAGNOSIS — I63432 Cerebral infarction due to embolism of left posterior cerebral artery: Principal | ICD-10-CM

## 2016-06-27 LAB — ECHOCARDIOGRAM COMPLETE
FS: 30 % (ref 28–44)
Height: 66 in
IVS/LV PW RATIO, ED: 0.82
LA ID, A-P, ES: 42 mm
LA diam end sys: 42 mm
LA diam index: 2.16 cm/m2
LA vol A4C: 92.9 ml
LA vol index: 42.9 mL/m2
LA vol: 83.2 mL
LV PW d: 12.6 mm — AB (ref 0.6–1.1)
LVOT SV: 41 mL
LVOT VTI: 14.6 cm
LVOT area: 2.84 cm2
LVOT diameter: 19 mm
LVOT peak vel: 81.1 cm/s
RV sys press: 20 mmHg
Reg peak vel: 205 cm/s
TAPSE: 18 mm
TR max vel: 205 cm/s
Weight: 2977.09 oz

## 2016-06-27 LAB — CBC
HCT: 37.2 % (ref 36.0–46.0)
Hemoglobin: 12 g/dL (ref 12.0–15.0)
MCH: 29 pg (ref 26.0–34.0)
MCHC: 32.3 g/dL (ref 30.0–36.0)
MCV: 89.9 fL (ref 78.0–100.0)
Platelets: 228 10*3/uL (ref 150–400)
RBC: 4.14 MIL/uL (ref 3.87–5.11)
RDW: 13.5 % (ref 11.5–15.5)
WBC: 6.6 10*3/uL (ref 4.0–10.5)

## 2016-06-27 LAB — LIPID PANEL
Cholesterol: 157 mg/dL (ref 0–200)
HDL: 50 mg/dL (ref >40)
LDL Cholesterol: 90 mg/dL (ref 0–99)
Total CHOL/HDL Ratio: 3.1 RATIO
Triglycerides: 85 mg/dL (ref <150)
VLDL: 17 mg/dL (ref 0–40)

## 2016-06-27 LAB — BASIC METABOLIC PANEL
Anion gap: 8 (ref 5–15)
BUN: 8 mg/dL (ref 6–20)
CO2: 25 mmol/L (ref 22–32)
Calcium: 9.1 mg/dL (ref 8.9–10.3)
Chloride: 102 mmol/L (ref 101–111)
Creatinine, Ser: 0.68 mg/dL (ref 0.44–1.00)
GFR calc Af Amer: >60 mL/min (ref >60)
GFR calc non Af Amer: >60 mL/min (ref >60)
Glucose, Bld: 102 mg/dL — ABNORMAL HIGH (ref 65–99)
Potassium: 3.3 mmol/L — ABNORMAL LOW (ref 3.5–5.1)
Sodium: 135 mmol/L (ref 135–145)

## 2016-06-27 MED ORDER — CIPROFLOXACIN HCL 500 MG PO TABS
500.0000 mg | ORAL_TABLET | Freq: Every day | ORAL | Status: DC
  Administered 2016-06-27 – 2016-06-28 (×2): 500 mg via ORAL
  Filled 2016-06-27 (×2): qty 1

## 2016-06-27 MED ORDER — APIXABAN 5 MG PO TABS
5.0000 mg | ORAL_TABLET | Freq: Two times a day (BID) | ORAL | Status: DC
  Administered 2016-06-27 – 2016-06-29 (×5): 5 mg via ORAL
  Filled 2016-06-27 (×5): qty 1

## 2016-06-27 MED ORDER — POTASSIUM CHLORIDE CRYS ER 20 MEQ PO TBCR
30.0000 meq | EXTENDED_RELEASE_TABLET | ORAL | Status: AC
  Administered 2016-06-27 (×2): 30 meq via ORAL
  Filled 2016-06-27 (×2): qty 1

## 2016-06-27 MED ORDER — POLYETHYLENE GLYCOL 3350 17 G PO PACK
17.0000 g | PACK | Freq: Every day | ORAL | Status: DC | PRN
  Administered 2016-06-27: 17 g via ORAL
  Filled 2016-06-27: qty 1

## 2016-06-27 MED ORDER — PRAVASTATIN SODIUM 20 MG PO TABS
20.0000 mg | ORAL_TABLET | Freq: Every day | ORAL | Status: DC
  Administered 2016-06-27 – 2016-06-28 (×2): 20 mg via ORAL
  Filled 2016-06-27 (×2): qty 1

## 2016-06-27 NOTE — Progress Notes (Signed)
PROGRESS NOTE                                                                                                                                                                                                             Patient Demographics:    Samantha Bishop, is a 83 y.o. female, DOB - Sep 22, 1924, [id number]  Admit date - 06/26/2016   Admitting Physician Norval Morton, MD  Outpatient Primary MD for the patient is Hoyt Koch, MD  LOS - 1  Chief Complaint  Patient presents with  . Altered Mental Status  . Headache       Brief Narrative    83 y.o. right hand dominant  female with medical history significant of HTN, HLD, DVT/PE, PAF on chronic anticoagulation of Xarelto, and optic neuralgia; who presents with complaints of headache And vision problems, her workup significant for left PCA territory infarct.   Subjective:    Samantha Bishop today has, No headache, No chest pain, No abdominal pain - No Nausea,Patient adamant about going on today.  Assessment  & Plan :    Principal Problem:   CVA (cerebral vascular accident) [facility]) Active Problems:   Essential hypertension   Atrial fibrillation - followed by Dr. Caryl Comes   INTRACRANIAL ANEURYSM   Occipital neuralgia   Right homonymous hemianopsia  Acute ischemic CVA - Left PCA infarct, embolic secondary to atrial fibrillation with resultant left hemianopia - MRI -  Acute moderate sized left PCA territory infarct. Remote right parietal and left cerebellar infarcts MRA - Attenuation of the distal left PCA, likely occluded. Multiple aneurysms. - 2-D echo EF 60-65%, with normal wall motion, severely dilated left atrium, with no evidence of embolic source. - Patient was on Xarelto at home, so she will be changed to liquids   Paroxysmal atrial fibrillation on chronic anticoagulation/ Hx of DVT/Pulmonary embolus:  - Currently rate controlled. Chadsvasc  Score= 7( hypertension, PE, stroke,  sex, age). Patient followed by Dr. Caryl Comes - Xarelto changed to liquids  Hypertension - permissive hypertension, will stop hydrochlorothiazide  Hyperlipidemia - LDL is 90, started on Pravachol  Stable 5 mm known aneurysm arising from the cavernous/supraclinoid left ICA . - per neuro :Recommend outpatient follow up with Dr. Delice Lesch Neurology outpatient who has followed with her in the past for this  Urinary tract infection: Acute.  -  Follow-up urine culture - Ciprofloxacin IV per pharmacy   Postmenopausal hormone replacement:  - Patient reports being on estradiol and estradiol vaginal cream - Estrogen replacement currently not ordered due to his associated increased stroke and clot risk.. Would question neurology risk of continued estrogen replacement.    Optic neuralgia - Continue gabapentin  Code Status : Full  Family Communication  : D/W son via phone  Disposition Plan  : will need SNF  Consults  :  Neurology  Procedures  : None  DVT Prophylaxis  :  On Eliquis  Lab Results  Component Value Date   PLT 228 06/27/2016    Antibiotics  :    Anti-infectives    Start     Dose/Rate Route Frequency Ordered Stop   06/27/16 2200  ciprofloxacin (CIPRO) IVPB 400 mg  Status:  Discontinued     400 mg 200 mL/hr over 60 Minutes Intravenous Every 24 hours 06/26/16 2147 06/27/16 1044   06/27/16 2200  ciprofloxacin (CIPRO) tablet 500 mg     500 mg Oral Daily at bedtime 06/27/16 1044     06/26/16 2245  ciprofloxacin (CIPRO) IVPB 400 mg     400 mg 200 mL/hr over 60 Minutes Intravenous  Once 06/26/16 2143 06/27/16 0145        Objective:   Vitals:   06/27/16 0100 06/27/16 0300 06/27/16 0508 06/27/16 0700  BP: (!) 144/93 (!) 144/64 (!) 144/70 (!) 162/70  Pulse: 83 92 87 95  Resp: 18 16 16    Temp: 98.5 F (36.9 C) 97.3 F (36.3 C) 98.1 F (36.7 C) 98.5 F (36.9 C)  TempSrc: Oral Axillary Axillary Oral  SpO2: 97% 94% 94% 95%  Weight:      Height:        Wt Readings  from Last 3 Encounters:  06/26/16 84.4 kg (186 lb 1.1 oz)  10/29/15 84.5 kg (186 lb 3.2 oz)  10/13/15 85.7 kg (189 lb)     Intake/Output Summary (Last 24 hours) at 06/27/16 1652 Last data filed at 06/27/16 1600  Gross per 24 hour  Intake          1673.75 ml  Output                0 ml  Net          1673.75 ml     Physical Exam  Awake Alert,Conversant  Supple Neck,No JVD, left hemianopia Symmetrical Chest wall movement, Good air movement bilaterally, CTAB irr irr,No Gallops,Rubs or new Murmurs, No Parasternal Heave +ve B.Sounds, Abd Soft, No tenderness,  No rebound - guarding or rigidity. No Cyanosis, Clubbing or edema, No new Rash or bruise      Data Review:    CBC  Recent Labs Lab 06/26/16 1402 06/27/16 0508  WBC 8.6 6.6  HGB 13.1 12.0  HCT 40.3 37.2  PLT 246 228  MCV 90.4 89.9  MCH 29.4 29.0  MCHC 32.5 32.3  RDW 13.8 13.5    Chemistries   Recent Labs Lab 06/26/16 1402 06/27/16 0508  NA 138 135  K 3.9 3.3*  CL 100* 102  CO2 29 25  GLUCOSE 94 102*  BUN 7 8  CREATININE 0.64 0.68  CALCIUM 9.7 9.1  AST 23  --   ALT 20  --   ALKPHOS 46  --   BILITOT 0.9  --    ------------------------------------------------------------------------------------------------------------------  Recent Labs  06/27/16 0508  CHOL 157  HDL 50  LDLCALC 90  TRIG 85  CHOLHDL  3.1    Lab Results  Component Value Date   HGBA1C 6.2 08/22/2012   ------------------------------------------------------------------------------------------------------------------ No results for input(s): TSH, T4TOTAL, T3FREE, THYROIDAB in the last 72 hours.  Invalid input(s): FREET3 ------------------------------------------------------------------------------------------------------------------ No results for input(s): VITAMINB12, FOLATE, FERRITIN, TIBC, IRON, RETICCTPCT in the last 72 hours.  Coagulation profile  Recent Labs Lab 06/26/16 1613  INR 1.44    No results for  input(s): DDIMER in the last 72 hours.  Cardiac Enzymes No results for input(s): CKMB, TROPONINI, MYOGLOBIN in the last 168 hours.  Invalid input(s): CK ------------------------------------------------------------------------------------------------------------------ No results found for: BNP  Inpatient Medications  Scheduled Meds: . apixaban  5 mg Oral BID  . ciprofloxacin  500 mg Oral QHS  . gabapentin  300 mg Oral Daily   And  . gabapentin  400 mg Oral QHS  . hydrochlorothiazide  12.5 mg Oral Daily  . pravastatin  20 mg Oral q1800  . senna-docusate  1 tablet Oral BID   Continuous Infusions: . sodium chloride 75 mL/hr at 06/27/16 0005   PRN Meds:.acetaminophen **OR** acetaminophen (TYLENOL) oral liquid 160 mg/5 mL **OR** acetaminophen, cycloSPORINE, HYDROcodone-acetaminophen, polyethylene glycol  Micro Results No results found for this or any previous visit (from the past 240 hour(s)).  Radiology Reports Dg Chest 2 View  Result Date: 06/26/2016 CLINICAL DATA:  Stroke today. EXAM: CHEST  2 VIEW COMPARISON:  CT chest 03/21/2015.  Chest 03/20/2015 FINDINGS: Shallow inspiration. Mild cardiac enlargement. No pulmonary vascular congestion or edema. No blunting of costophrenic angles. No pneumothorax. Mediastinal contours appear intact. Calcified and tortuous aorta. Degenerative changes in the spine and shoulders. IMPRESSION: Cardiac enlargement. No evidence of active pulmonary disease. Aortic atherosclerosis. Electronically Signed   By: Lucienne Capers M.D.   On: 06/26/2016 22:40   Ct Head Wo Contrast  Result Date: 06/26/2016 CLINICAL DATA:  Visual field deficit.  Confusion. EXAM: CT HEAD WITHOUT CONTRAST TECHNIQUE: Contiguous axial images were obtained from the base of the skull through the vertex without intravenous contrast. COMPARISON:  07/20/2013 FINDINGS: Brain: Acute to subacute ischemia identified medial left occipital lobe, consistent with PCA territory infarct on the left.  Old right parieto-occipital infarct stable in appearance. Diffuse loss of parenchymal volume is consistent with atrophy. Patchy low attenuation in the deep hemispheric and periventricular white matter is nonspecific, but likely reflects chronic microvascular ischemic demyelination. Apparent lacunar infarct in the left aspect of the pons is stable. Vascular: Atherosclerotic calcification is visualized in the carotid arteries. No dense MCA sign. Major dural sinuses are unremarkable. Skull: Bone windows reveal no worrisome lytic or sclerotic osseous lesions. Sinuses/Orbits: Polypoid chronic mucosal disease left maxillary sinus. Remaining visualized paranasal sinuses and mastoid air cells are clear. Visualized portions of the globes and intraorbital fat are unremarkable. Other: None. IMPRESSION: 1. Acute to subacute left PCA territory infarct. MRI may prove helpful to assess acuity, as clinically warranted. No evidence for associated hemorrhage. 2. Stable appearance of old right parieto-occipital lobe infarct. 3. Chronic small vessel white matter ischemic disease. Critical Value/emergent results were called by me at the time of interpretation on 06/26/2016 at 6:10 pm to Dr. Charlena Cross , who verbally acknowledged these results. Electronically Signed   By: Misty Stanley M.D.   On: 06/26/2016 18:10   Mr Jodene Nam Neck W Wo Contrast  Result Date: 06/26/2016 CLINICAL DATA:  Initial evaluation for acute stroke. EXAM: MRI HEAD WITHOUT CONTRAST MRA HEAD WITHOUT CONTRAST MRA NECK WITHOUT AND WITH CONTRAST TECHNIQUE: Multiplanar, multiecho pulse sequences of the brain and surrounding structures  were obtained without intravenous contrast. Angiographic images of the Circle of Willis were obtained using MRA technique without intravenous contrast. Angiographic images of the neck were obtained using MRA technique without and with intravenous contrast. Carotid stenosis measurements (when applicable) are obtained utilizing NASCET  criteria, using the distal internal carotid diameter as the denominator. CONTRAST:  31mL MULTIHANCE GADOBENATE DIMEGLUMINE 529 MG/ML IV SOLN COMPARISON:  Comparison made with prior CT from earlier the same day. FINDINGS: MRI HEAD FINDINGS Generalized age-related cerebral atrophy present. Patchy and confluent T2/FLAIR hyperintensity within the periventricular deep white matter both cerebral hemispheres, most consistent with chronic microvascular ischemic disease. Chronic microvascular ischemic changes present within the pons as well. Encephalomalacia within the right parietal lobe compatible with remote ischemic infarct. Additional small remote infarct noted within the parasagittal left cerebellar hemisphere. Confluent restricted diffusion within the parasagittal left occipital lobe, compatible with acute left PCA territory infarct. No significant mass effect. Associated petechial hemorrhage within the peripheral aspect of this infarct without frank hemorrhagic transformation. No other evidence for acute or subacute ischemia. Gray-white matter differentiation otherwise maintained. No mass lesion, midline shift or mass effect. No hydrocephalus. No extra-axial fluid collection. Major dural sinuses are grossly patent. Pituitary gland and suprasellar region within normal limits. Major intracranial vascular flow voids are maintained. Craniocervical junction within normal limits. Visualized upper cervical spine unremarkable. Bone marrow signal intensity within normal limits. No scalp soft tissue abnormality. Globes and orbital soft tissues within normal limits. Patient is status post lens extraction bilaterally. Scattered mucosal thickening within the ethmoidal air cells. Retention cyst noted within the left maxillary sinus. Paranasal sinuses are otherwise clear. No mastoid effusion. Inner ear structures grossly normal. MRA HEAD FINDINGS ANTERIOR CIRCULATION: Distal cervical segments of the internal carotid arteries are  patent with antegrade flow. Petrous, cavernous, and supraclinoid segments widely patent bilaterally without flow-limiting stenosis. 5 mm saccular aneurysm arises from the cavernous/ supraclinoid left ICA (series 5, image 98). Additional tiny 2 mm focal outpouching arising at the takeoff of the left P com may reflect an additional small aneurysm or vascular infundibulum (series 5, image 85). A1 segments patent. Probable additional 2 mm aneurysm arising from the anterior communicating artery the the (series 5, image 71). Anterior communicating arteries widely patent distally. M1 segments patent without stenosis or occlusion. Left MCA bifurcates early. Distal MCA branches well opacified and symmetric. POSTERIOR CIRCULATION: Vertebral arteries patent to the vertebrobasilar junction without significant stenosis. Right vertebral artery is dominant, with slightly diminutive left vertebral artery. No findings to suggest dissection. Posterior inferior cerebral arteries patent proximally. Basilar artery widely patent. Superior cerebral arteries patent bilaterally. Hypoplastic right P1 segment with prominent widely patent right posterior communicating artery. Left PCA largely supplied via the basilar artery. Right PCA widely patent to its distal aspect. Left PCA is attenuated distally, likely occluded given the left PCA territory infarct. MRA NECK FINDINGS Visualized aortic arch of normal caliber with normal 3 vessel morphology. No high-grade stenosis seen at the origin of the great vessels. Partially visualized subclavian arteries widely patent. Right common carotid artery widely patent from its origin to the bifurcation. Mild atheromatous irregularity about the right bifurcation/proximal right ICA with associated mild stenosis of the proximal right ICA. Right ICA patent distally to the skullbase without stenosis or occlusion. Left common carotid artery patent from its origin to the bifurcation. Minimal atheromatous  irregularity about the left bifurcation without significant narrowing. Left ICA patent distally to the skullbase without stenosis or occlusion. Both of the vertebral arteries arise from  the subclavian arteries. Right vertebral artery slightly dominant. Vertebral arteries are tortuous proximally. Vertebral arteries patent within the neck without significant stenosis or occlusion. No obvious evidence for dissection. Atheromatous irregularity with tortuosity noted within the distal left V2 segment. IMPRESSION: MRI HEAD IMPRESSION: 1. Acute moderate sized left PCA territory infarct. Associated petechial hemorrhage without significant mass effect. 2. Remote right parietal and left cerebellar infarcts as above. 3. Generalized age-related cerebral atrophy with moderate chronic microvascular ischemic disease. MRA HEAD IMPRESSION: 1. Attenuation of the distal left PCA, likely occluded given the acute left PCA territory infarct (left P3 segment). 2. Otherwise widely patent anterior and posterior circulation. No high-grade or correctable stenosis. 3. 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 Additional 2 mm focal outpouching arising at the takeoff of the left P com may reflect a small aneurysm or possibly vascular infundibulum. MRA NECK IMPRESSION: 1. Short-segment mild atheromatous narrowing at the proximal right ICA. Otherwise widely patent right carotid artery system. 2. No significant atheromatous disease or luminal narrowing within the left carotid artery system. 3. Widely patent vertebral arteries within the neck. Electronically Signed   By: Jeannine Boga M.D.   On: 06/26/2016 23:43   Mr Brain Wo Contrast  Result Date: 06/26/2016 CLINICAL DATA:  Initial evaluation for acute stroke. EXAM: MRI HEAD WITHOUT CONTRAST MRA HEAD WITHOUT CONTRAST MRA NECK WITHOUT AND WITH CONTRAST TECHNIQUE: Multiplanar, multiecho pulse  sequences of the brain and surrounding structures were obtained without intravenous contrast. Angiographic images of the Circle of Willis were obtained using MRA technique without intravenous contrast. Angiographic images of the neck were obtained using MRA technique without and with intravenous contrast. Carotid stenosis measurements (when applicable) are obtained utilizing NASCET criteria, using the distal internal carotid diameter as the denominator. CONTRAST:  79mL MULTIHANCE GADOBENATE DIMEGLUMINE 529 MG/ML IV SOLN COMPARISON:  Comparison made with prior CT from earlier the same day. FINDINGS: MRI HEAD FINDINGS Generalized age-related cerebral atrophy present. Patchy and confluent T2/FLAIR hyperintensity within the periventricular deep white matter both cerebral hemispheres, most consistent with chronic microvascular ischemic disease. Chronic microvascular ischemic changes present within the pons as well. Encephalomalacia within the right parietal lobe compatible with remote ischemic infarct. Additional small remote infarct noted within the parasagittal left cerebellar hemisphere. Confluent restricted diffusion within the parasagittal left occipital lobe, compatible with acute left PCA territory infarct. No significant mass effect. Associated petechial hemorrhage within the peripheral aspect of this infarct without frank hemorrhagic transformation. No other evidence for acute or subacute ischemia. Gray-white matter differentiation otherwise maintained. No mass lesion, midline shift or mass effect. No hydrocephalus. No extra-axial fluid collection. Major dural sinuses are grossly patent. Pituitary gland and suprasellar region within normal limits. Major intracranial vascular flow voids are maintained. Craniocervical junction within normal limits. Visualized upper cervical spine unremarkable. Bone marrow signal intensity within normal limits. No scalp soft tissue abnormality. Globes and orbital soft tissues  within normal limits. Patient is status post lens extraction bilaterally. Scattered mucosal thickening within the ethmoidal air cells. Retention cyst noted within the left maxillary sinus. Paranasal sinuses are otherwise clear. No mastoid effusion. Inner ear structures grossly normal. MRA HEAD FINDINGS ANTERIOR CIRCULATION: Distal cervical segments of the internal carotid arteries are patent with antegrade flow. Petrous, cavernous, and supraclinoid segments widely patent bilaterally without flow-limiting stenosis. 5 mm saccular aneurysm arises from the cavernous/ supraclinoid left ICA (series 5, image 98). Additional tiny 2 mm focal outpouching arising at the takeoff of  the left P com may reflect an additional small aneurysm or vascular infundibulum (series 5, image 85). A1 segments patent. Probable additional 2 mm aneurysm arising from the anterior communicating artery the the (series 5, image 71). Anterior communicating arteries widely patent distally. M1 segments patent without stenosis or occlusion. Left MCA bifurcates early. Distal MCA branches well opacified and symmetric. POSTERIOR CIRCULATION: Vertebral arteries patent to the vertebrobasilar junction without significant stenosis. Right vertebral artery is dominant, with slightly diminutive left vertebral artery. No findings to suggest dissection. Posterior inferior cerebral arteries patent proximally. Basilar artery widely patent. Superior cerebral arteries patent bilaterally. Hypoplastic right P1 segment with prominent widely patent right posterior communicating artery. Left PCA largely supplied via the basilar artery. Right PCA widely patent to its distal aspect. Left PCA is attenuated distally, likely occluded given the left PCA territory infarct. MRA NECK FINDINGS Visualized aortic arch of normal caliber with normal 3 vessel morphology. No high-grade stenosis seen at the origin of the great vessels. Partially visualized subclavian arteries widely patent.  Right common carotid artery widely patent from its origin to the bifurcation. Mild atheromatous irregularity about the right bifurcation/proximal right ICA with associated mild stenosis of the proximal right ICA. Right ICA patent distally to the skullbase without stenosis or occlusion. Left common carotid artery patent from its origin to the bifurcation. Minimal atheromatous irregularity about the left bifurcation without significant narrowing. Left ICA patent distally to the skullbase without stenosis or occlusion. Both of the vertebral arteries arise from the subclavian arteries. Right vertebral artery slightly dominant. Vertebral arteries are tortuous proximally. Vertebral arteries patent within the neck without significant stenosis or occlusion. No obvious evidence for dissection. Atheromatous irregularity with tortuosity noted within the distal left V2 segment. IMPRESSION: MRI HEAD IMPRESSION: 1. Acute moderate sized left PCA territory infarct. Associated petechial hemorrhage without significant mass effect. 2. Remote right parietal and left cerebellar infarcts as above. 3. Generalized age-related cerebral atrophy with moderate chronic microvascular ischemic disease. MRA HEAD IMPRESSION: 1. Attenuation of the distal left PCA, likely occluded given the acute left PCA territory infarct (left P3 segment). 2. Otherwise widely patent anterior and posterior circulation. No high-grade or correctable stenosis. 3. 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 Additional 2 mm focal outpouching arising at the takeoff of the left P com may reflect a small aneurysm or possibly vascular infundibulum. MRA NECK IMPRESSION: 1. Short-segment mild atheromatous narrowing at the proximal right ICA. Otherwise widely patent right carotid artery system. 2. No significant atheromatous disease or luminal narrowing within the left carotid artery  system. 3. Widely patent vertebral arteries within the neck. Electronically Signed   By: Jeannine Boga M.D.   On: 06/26/2016 23:43   Mr Jodene Nam Head/brain [id number] Cm  Result Date: 06/26/2016 CLINICAL DATA:  Initial evaluation for acute stroke. EXAM: MRI HEAD WITHOUT CONTRAST MRA HEAD WITHOUT CONTRAST MRA NECK WITHOUT AND WITH CONTRAST TECHNIQUE: Multiplanar, multiecho pulse sequences of the brain and surrounding structures were obtained without intravenous contrast. Angiographic images of the Circle of Willis were obtained using MRA technique without intravenous contrast. Angiographic images of the neck were obtained using MRA technique without and with intravenous contrast. Carotid stenosis measurements (when applicable) are obtained utilizing NASCET criteria, using the distal internal carotid diameter as the denominator. CONTRAST:  64mL MULTIHANCE GADOBENATE DIMEGLUMINE 529 MG/ML IV SOLN COMPARISON:  Comparison made with prior CT from earlier the same day. FINDINGS: MRI HEAD FINDINGS Generalized age-related cerebral atrophy  present. Patchy and confluent T2/FLAIR hyperintensity within the periventricular deep white matter both cerebral hemispheres, most consistent with chronic microvascular ischemic disease. Chronic microvascular ischemic changes present within the pons as well. Encephalomalacia within the right parietal lobe compatible with remote ischemic infarct. Additional small remote infarct noted within the parasagittal left cerebellar hemisphere. Confluent restricted diffusion within the parasagittal left occipital lobe, compatible with acute left PCA territory infarct. No significant mass effect. Associated petechial hemorrhage within the peripheral aspect of this infarct without frank hemorrhagic transformation. No other evidence for acute or subacute ischemia. Gray-white matter differentiation otherwise maintained. No mass lesion, midline shift or mass effect. No hydrocephalus. No extra-axial fluid  collection. Major dural sinuses are grossly patent. Pituitary gland and suprasellar region within normal limits. Major intracranial vascular flow voids are maintained. Craniocervical junction within normal limits. Visualized upper cervical spine unremarkable. Bone marrow signal intensity within normal limits. No scalp soft tissue abnormality. Globes and orbital soft tissues within normal limits. Patient is status post lens extraction bilaterally. Scattered mucosal thickening within the ethmoidal air cells. Retention cyst noted within the left maxillary sinus. Paranasal sinuses are otherwise clear. No mastoid effusion. Inner ear structures grossly normal. MRA HEAD FINDINGS ANTERIOR CIRCULATION: Distal cervical segments of the internal carotid arteries are patent with antegrade flow. Petrous, cavernous, and supraclinoid segments widely patent bilaterally without flow-limiting stenosis. 5 mm saccular aneurysm arises from the cavernous/ supraclinoid left ICA (series 5, image 98). Additional tiny 2 mm focal outpouching arising at the takeoff of the left P com may reflect an additional small aneurysm or vascular infundibulum (series 5, image 85). A1 segments patent. Probable additional 2 mm aneurysm arising from the anterior communicating artery the the (series 5, image 71). Anterior communicating arteries widely patent distally. M1 segments patent without stenosis or occlusion. Left MCA bifurcates early. Distal MCA branches well opacified and symmetric. POSTERIOR CIRCULATION: Vertebral arteries patent to the vertebrobasilar junction without significant stenosis. Right vertebral artery is dominant, with slightly diminutive left vertebral artery. No findings to suggest dissection. Posterior inferior cerebral arteries patent proximally. Basilar artery widely patent. Superior cerebral arteries patent bilaterally. Hypoplastic right P1 segment with prominent widely patent right posterior communicating artery. Left PCA largely  supplied via the basilar artery. Right PCA widely patent to its distal aspect. Left PCA is attenuated distally, likely occluded given the left PCA territory infarct. MRA NECK FINDINGS Visualized aortic arch of normal caliber with normal 3 vessel morphology. No high-grade stenosis seen at the origin of the great vessels. Partially visualized subclavian arteries widely patent. Right common carotid artery widely patent from its origin to the bifurcation. Mild atheromatous irregularity about the right bifurcation/proximal right ICA with associated mild stenosis of the proximal right ICA. Right ICA patent distally to the skullbase without stenosis or occlusion. Left common carotid artery patent from its origin to the bifurcation. Minimal atheromatous irregularity about the left bifurcation without significant narrowing. Left ICA patent distally to the skullbase without stenosis or occlusion. Both of the vertebral arteries arise from the subclavian arteries. Right vertebral artery slightly dominant. Vertebral arteries are tortuous proximally. Vertebral arteries patent within the neck without significant stenosis or occlusion. No obvious evidence for dissection. Atheromatous irregularity with tortuosity noted within the distal left V2 segment. IMPRESSION: MRI HEAD IMPRESSION: 1. Acute moderate sized left PCA territory infarct. Associated petechial hemorrhage without significant mass effect. 2. Remote right parietal and left cerebellar infarcts as above. 3. Generalized age-related cerebral atrophy with moderate chronic microvascular ischemic disease. MRA HEAD IMPRESSION: 1. Attenuation  of the distal left PCA, likely occluded given the acute left PCA territory infarct (left P3 segment). 2. Otherwise widely patent anterior and posterior circulation. No high-grade or correctable stenosis. 3. 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  Additional 2 mm focal outpouching arising at the takeoff of the left P com may reflect a small aneurysm or possibly vascular infundibulum. MRA NECK IMPRESSION: 1. Short-segment mild atheromatous narrowing at the proximal right ICA. Otherwise widely patent right carotid artery system. 2. No significant atheromatous disease or luminal narrowing within the left carotid artery system. 3. Widely patent vertebral arteries within the neck. Electronically Signed   By: Jeannine Boga M.D.   On: 06/26/2016 23:43    Time Spent in minutes  25   Samantha Bishop M.D on 06/27/2016 at 4:52 PM  Between 7am to 7pm - Pager - [phone]  After 7pm go to [email or web address] - password [facility]  [facility] -  Office  [phone]

## 2016-06-27 NOTE — Progress Notes (Signed)
STROKE TEAM PROGRESS NOTE   HISTORY OF PRESENT ILLNESS (per record) Samantha Bishop is a 83 y.o. female with a history of atrial fibrillation who presents with new acute stroke. She states that she has not been feeling right since last Thursday, but really noticed vision problems more pronounced last night. Due to this, she presented to the emergency department today and had a CT of her head which demonstrates a left PCA territory infarct.  She also has been complaining about a left posterior headache which is been present since Thursday as well.  Of note, she consistently takes her Xarelto on a empty stomach.   LKW: Thursday tpa given?: no, out of window   SUBJECTIVE (INTERVAL HISTORY) Her family is not at bedside. She feels fine and wants to go home today.She endorses compliance with Xarelto and always takes it on an empty stomach. Discussed switching to Eliquis.    OBJECTIVE Temp:  [97.3 F (36.3 C)-98.8 F (37.1 C)] 98.1 F (36.7 C) (02/18 0508) Pulse Rate:  [83-93] 87 (02/18 0508) Cardiac Rhythm: Atrial fibrillation (02/17 2305) Resp:  [14-20] 16 (02/18 0508) BP: (144-193)/(64-129) 144/70 (02/18 0508) SpO2:  [94 %-99 %] 94 % (02/18 0508) FiO2 (%):  [21 %] 21 % (02/17 1928) Weight:  [84.4 kg (186 lb 1.1 oz)] 84.4 kg (186 lb 1.1 oz) (02/17 2309)  CBC:  Recent Labs Lab 06/26/16 1402 06/27/16 0508  WBC 8.6 6.6  HGB 13.1 12.0  HCT 40.3 37.2  MCV 90.4 89.9  PLT 246 [id number]    Basic Metabolic Panel:  Recent Labs Lab 06/26/16 1402 06/27/16 0508  NA 138 135  K 3.9 3.3*  CL 100* 102  CO2 29 25  GLUCOSE 94 102*  BUN 7 8  CREATININE 0.64 0.68  CALCIUM 9.7 9.1    Lipid Panel:    Component Value Date/Time   CHOL 157 06/27/2016 0508   TRIG 85 06/27/2016 0508   HDL 50 06/27/2016 0508   CHOLHDL 3.1 06/27/2016 0508   VLDL 17 06/27/2016 0508   LDLCALC 90 06/27/2016 0508   HgbA1c:  Lab Results  Component Value Date   HGBA1C 6.2 08/22/2012   Urine Drug Screen:  No results found for: LABOPIA, COCAINSCRNUR, LABBENZ, AMPHETMU, THCU, LABBARB    IMAGING  Dg Chest 2 View 06/26/2016 Cardiac enlargement. No evidence of active pulmonary disease. Aortic atherosclerosis.    Ct Head Wo Contrast 06/26/2016 1. Acute to subacute left PCA territory infarct. MRI may prove helpful to assess acuity, as clinically warranted. No evidence for associated hemorrhage.  2. Stable appearance of old right parieto-occipital lobe infarct.  3. Chronic small vessel white matter ischemic disease.      Mr [facility] and Neck W Wo Contrast 06/26/2016  MRI HEAD  1. Acute moderate sized left PCA territory infarct. Associated petechial hemorrhage without significant mass effect.  2. Remote right parietal and left cerebellar infarcts as above.  3. Generalized age-related cerebral atrophy with moderate chronic microvascular ischemic disease.   MRA HEAD   1. Attenuation of the distal left PCA, likely occluded given the acute left PCA territory infarct (left P3 segment).  2. Otherwise widely patent anterior and posterior circulation. No high-grade or correctable stenosis.  3. 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 Additional 2 mm focal outpouching arising at the takeoff of the left P com may reflect a small aneurysm or possibly vascular infundibulum.   MRA NECK  1. Short-segment  mild atheromatous narrowing at the proximal right ICA. Otherwise widely patent right carotid artery system. 2. No significant atheromatous disease or luminal narrowing within the left carotid artery system.  3. Widely patent vertebral arteries within the neck.   Physical exam: Exam: Gen: NAD, conversant, well nourised, obese, well groomed                     CV: RRR, no MRG. No Carotid Bruits. No peripheral edema, warm, nontender Eyes: Conjunctivae clear without exudates or hemorrhage  Neuro: Detailed  Neurologic Exam  Speech:    Speech is normal; fluent and spontaneous with normal comprehension.  Cognition:    The patient is oriented to person, place, and time;  Cranial Nerves:    The pupils are equal, round, and reactive to light. Dense left hemianopia. Extraocular movements are intact. Trigeminal sensation is intact and the muscles of mastication are normal. The face is symmetric. The palate elevates in the midline. Hearing intact. Voice is normal. Shoulder shrug is normal. The tongue has normal motion without fasciculations.   Coordination:    Normal finger to nose.  Motor Observation:    No asymmetry, no atrophy, and no involuntary movements noted. Tone:    Normal muscle tone.    Posture:    Posture is normal. normal erect    Strength:    Strength is V/V in the upper and lower limbs.      Sensation: intact to LT     ASSESSMENT/PLAN Ms. Samantha Bishop is a 83 y.o. female with history of previous strokes, obstructive sleep apnea, pulmonary emboli, atrial fibrillation on Xarelto, hyperlipidemia, hypertension, known cerebral aneurysm, and anxiety presenting with headache and visual difficulties. She did not receive IV t-PA due to late presentation and anticoagulation.  Stroke: Left PCA infarct - embolic secondary to atrial fibrillation.  Resultant  Dense left heminopia  MRI -  Acute moderate sized left PCA territory infarct. Remote right parietal and left cerebellar infarcts.  MRA - Attenuation of the distal left PCA, likely occluded. Multiple aneurysms.  Carotid Doppler - MRA neck  2D Echo - pending  LDL - 90  HgbA1c - pending  VTE prophylaxis - SCDs  Diet Heart Room service appropriate? Yes; Fluid consistency: Thin  Xarelto (rivaroxaban) daily prior to admission, now on aspirin 325 mg daily. Recommend discharge on Eliquis.   Patient counseled to be compliant with her antithrombotic medications  Ongoing aggressive stroke risk factor management  Therapy  recommendations: pending  Disposition: Pending  Hypertension  Stable  Permissive hypertension (OK if < 220/120) but gradually normalize in 5-7 days  Long-term BP goal normotensive  Hyperlipidemia  Home meds: No lipid lowering medications prior to admission  LDL 90, goal < 70  Start Pravachol 20 mg daily  Continue statin at discharge    Other Stroke Risk Factors  Advanced age  ETOH use, advised to drink no more than 1 drink a day  Obesity, Body mass index is 30.03 kg/m., recommend weight loss, diet and exercise as appropriate   Hx stroke/TIA  Family hx stroke (father)  Atrial fibrillation - was not taking Xarelto correctly.   Other Active Problems  Stable 5 mm known aneurysm arising from the cavernous/supraclinoid left ICA .Recommend outpatient follow up with Dr. Delice Lesch Neurology outpatient who has followed with her in the past for this.   Hypokalemia - 3.3  Hospital day # 1  Personally examined patient and images, and have participated in and made any corrections needed to  history, physical, neuro exam,assessment and plan as stated above.  I have personally obtained the history, evaluated lab date, reviewed imaging studies and agree with radiology interpretations.    Sarina Ill, MD Stroke Neurology [facility]  To contact Stroke Continuity provider, please refer to [email or web address] After hours, contact General Neurology

## 2016-06-27 NOTE — Progress Notes (Signed)
  Echocardiogram 2D Echocardiogram has been performed.  Samantha Bishop 06/27/2016, 4:39 PM

## 2016-06-27 NOTE — Evaluation (Signed)
Speech Language Pathology Evaluation Patient Details Name: Samantha Bishop MRN: [id number] DOB: 11-21-1924 Today's Date: 06/27/2016 Time: [id number] SLP Time Calculation (min) (ACUTE ONLY): 36 min  Problem List:  Patient Active Problem List   Diagnosis Date Noted  . CVA (cerebral vascular accident) ([place]) 06/26/2016  . Right homonymous hemianopsia 06/26/2016  . Back pain 04/08/2015  . Abnormal CT scan 04/08/2015  . Constipation 01/02/2015  . Intracranial aneurysm 10/18/2014  . Routine general medical examination at a health care facility 03/08/2014  . Occipital neuralgia 07/16/2013  . Venous insufficiency 07/11/2013  . Alopecia 06/08/2012  . Osteopenia 02/21/2010  . ANXIETY 05/21/2009  . Essential hypertension 05/21/2009  . Atrial fibrillation - followed by Dr. Caryl Comes 03/03/2009  . INTRACRANIAL ANEURYSM 08/16/2007  . Hyperlipidemia 08/15/2007  . GERD 08/15/2007   Past Medical History:  Past Medical History:  Diagnosis Date  . Anxiety state, unspecified   . Calculus of kidney   . Cerebral aneurysm, nonruptured   . Colon polyp   . Complication of anesthesia    "my heart stopped" 2004 knee  surg - see note on chart  . DEEP VENOUS THROMBOPHLEBITIS 08/15/2007   Qualifier: History of  By: Lenna Gilford MD, Deborra Medina   . Diverticulosis of colon (without mention of hemorrhage)   . History of skin cancer   . HTN (hypertension)   . Hyperlipidemia   . Lumbago   . Osteoarthrosis, unspecified whether generalized or localized, unspecified site   . Permanent atrial fibrillation ([place])   . Pulmonary emboli ([place])    following remote knee arthroscopy surgery  . Ruptured lumbar disc   . Sleep apnea    stop bang score 4   . Unspecified hemorrhoids without mention of complication    Past Surgical History:  Past Surgical History:  Procedure Laterality Date  . ABDOMINAL HYSTERECTOMY    . APPENDECTOMY    . BLOCKED INTESTINE SURGERY    . BREAST CYST EXCISION    . HEMHORROIDECTOMY    . JOINT  REPLACEMENT  2004   RT TOTAL KNEE  . KNEE ARTHROSCOPY     X2 L KNEE  . KNEE SURGERY    . NASAL SURGERY X2    . TONSILLECTOMY AND ADENOIDECTOMY    . TOTAL KNEE ARTHROPLASTY  09/03/2011   Procedure: TOTAL KNEE ARTHROPLASTY;  Surgeon: Gearlean Alf, MD;  Location: [facility];  Service: Orthopedics;  Laterality: Left;   HPI:  Samantha Bishop a 83 y.o.femalewith ahistory of atrial fibrillation who presents with new acute stroke.She states that she has not been feeling right since last Thursday, but really noticed vision problemsmore pronounced last night. Due to this, she presented to the emergency department today and had a MRI of her brain demonstrates a moderate left PCA territory infarct as well as remote right parietal and left cerebellar infarcts.    Assessment / Plan / Recommendation Clinical Impression  Pt presents with moderate cognitive deficits post left PCA infarct of moderate size as evidenced by the [facility] 9/30 . PLOF pt lives alone and was independent with all ADLs including complex tasks (driving, mowing lawn, medicines & finances). Pt with very poor insight to acute cognitive changes following CVA despite education from results of testing. Deficits characterized by poor novel recall, decreased executive function skills, poor mental manipulation, and reduced thought organziation. OT reports significant concerns with safety during functional tasks and onset of visual deficits. Pts son, [name] at bedside, educated regarding recommendation for pt to have 24 hour assitance at home  to ensure safety. Recommend 24 hour care with [facility] services vs short term SNF placement. ST to continue to follow during acute stay.      SLP Assessment  Patient needs continued Speech Lanaguage Pathology Services    Follow Up Recommendations  Skilled Nursing facility;24 hour supervision/assistance    Frequency and Duration min 2x/week  1 week      SLP Evaluation Cognition  Overall Cognitive Status:  Impaired/Different from baseline Arousal/Alertness: Awake/alert Orientation Level: Disoriented to time;Other (comment);Disoriented to situation;Oriented to person;Oriented to place (disoriented to specifics of temporal orienation ) Memory: Impaired Memory Impairment: Decreased recall of new information Awareness: Impaired Awareness Impairment: Intellectual impairment;Emergent impairment;Anticipatory impairment Problem Solving: Impaired Problem Solving Impairment: Verbal complex;Functional complex Executive Function: Self Monitoring;Organizing;Sequencing;Reasoning Reasoning: Impaired Sequencing: Impaired Self Monitoring: Impaired Safety/Judgment: Impaired       Comprehension  Auditory Comprehension Overall Auditory Comprehension: Other (comment) (has bilateral hearing aids at baseline; very HOH )    Expression Expression Primary Mode of Expression: Verbal Verbal Expression Overall Verbal Expression: Appears within functional limits for tasks assessed Written Expression Dominant Hand: Right Written Expression: Within Functional Limits   Oral / Motor  Oral Motor/Sensory Function Overall Oral Motor/Sensory Function: Within functional limits Motor Speech Overall Motor Speech: Appears within functional limits for tasks assessed   GO                   Arvil Chaco MA, CCC-SLP Acute Care Speech Language Pathologist    Levi Aland 06/27/2016, 4:00 PM

## 2016-06-27 NOTE — Evaluation (Addendum)
Physical Therapy Evaluation Patient Details Name: Samantha Bishop MRN: [id number] DOB: [birth year] Today's Date: 06/27/2016   History of Present Illness   Samantha Bishop is a 83 y.o. right hand dominant  female with medical history significant of HTN, HLD, DVT/PE, PAF on chronic anticoagulation of Xarelto, and optic neuralgia; who presents with complaints of headache. Patient notes symptoms started approximately 2 days ago after placement of her hearing aids.At baseline patient lives alone and is able to complete all of her ADLs including driving; on admission, pt stated significant difficulty keeping her balance and felt as though the room was spinning; Imaging revealed a subacute left PCA stroke; possible positive UTI  Clinical Impression   Pt admitted with above diagnosis. Pt currently with functional limitations due to the deficits listed below (see PT Problem List). Samantha Bishop present with gait deviations and incr fall risk; Discussed case with OT as well, and given balance deficits as well as vision deficits, she is at a significantly incr fall risk, and is unsafe to be home alone; We must consider SNF for rehab to maximize independence and safety with mobility prior to dc home;  Pt will benefit from skilled PT to increase their independence and safety with mobility to allow discharge to the venue listed below.       Follow Up Recommendations SNF  Equipment Recommendations  Cane vs RW   Recommendations for Other Services OT consult     Precautions / Restrictions Precautions Precautions: Fall Precaution Comments: Fall risk greatly reduced with use of assistive device      Mobility  Bed Mobility                  Transfers Overall transfer level: Needs assistance Equipment used: None Transfers: Sit to/from Stand Sit to Stand: Min guard (without physical contact)         General transfer comment: Cues for hand placement and safety  Ambulation/Gait Ambulation/Gait  assistance: Min assist;Min guard Ambulation Distance (Feet): 120 Feet (x2) Assistive device: None;Straight cane (and hallway rail; as well as pushing IV pole) Gait Pattern/deviations: Step-through pattern;Decreased step length - right;Decreased step length - left;Decreased stride length     General Gait Details: Small, guarded, inefficient steps when walking without any UE support; walked some pushing IV pole with little change in gait pattern; More steady and better steps using hallway rail, so we then opted to try walking with a cane -- which she did rather well, with correct pattern without the need for cues  Stairs            Wheelchair Mobility    Modified Rankin (Stroke Patients Only) Modified Rankin (Stroke Patients Only) Pre-Morbid Rankin Score: No symptoms Modified Rankin: Slight disability     Balance Overall balance assessment: Needs assistance           Standing balance-Leahy Scale: Fair                               Pertinent Vitals/Pain Pain Assessment: No/denies pain    Home Living Family/patient expects to be discharged to:: Private residence Living Arrangements: Alone Available Help at Discharge: Family;Friend(s);Available PRN/intermittently Type of Home: House Home Access: Stairs to enter Entrance Stairs-Rails: Psychiatric nurse of Steps: 4 Home Layout: One level Home Equipment: Walker - 4 wheels      Prior Function Level of Independence: Independent         Comments: Drives, mows her  lawn; very much values her independence     Hand Dominance        Extremity/Trunk Assessment   Upper Extremity Assessment Upper Extremity Assessment: Defer to OT evaluation    Lower Extremity Assessment Lower Extremity Assessment: Overall WFL for tasks assessed       Communication   Communication: HOH  Cognition Arousal/Alertness: Awake/alert Behavior During Therapy: WFL for tasks assessed/performed Overall  Cognitive Status: Within Functional Limits for tasks assessed                      General Comments General comments (skin integrity, edema, etc.): Pt did not describe any room spinning or vertiginous symptoms during PT eval today; denied visual difficulty, but pt is likely masking her deficits    Exercises     Assessment/Plan    PT Assessment Patient needs continued PT services  PT Problem List Decreased activity tolerance;Decreased balance;Decreased mobility;Decreased coordination;Decreased knowledge of use of DME;Decreased safety awareness;Decreased knowledge of precautions          PT Treatment Interventions DME instruction;Gait training;Stair training;Functional mobility training;Therapeutic activities;Therapeutic exercise;Balance training;Neuromuscular re-education;Patient/family education    PT Goals (Current goals can be found in the Care Plan section)  Acute Rehab PT Goals Patient Stated Goal: Wants to be home ASAP PT Goal Formulation: With patient Time For Goal Achievement: 07/04/16 Potential to Achieve Goals: Good    Frequency Min 4X/week   Barriers to discharge Decreased caregiver support Lives alone; local son checks in; must be modifiend independent to dc home    Co-evaluation               End of Session Equipment Utilized During Treatment: Gait belt Activity Tolerance: Patient tolerated treatment well Patient left: in chair;with call bell/phone within reach;with chair alarm set Nurse Communication: Mobility status         Time: 1130-1159 PT Time Calculation (min) (ACUTE ONLY): 29 min   Charges:   PT Evaluation $PT Eval Low Complexity: 1 Procedure PT Treatments $Gait Training: 8-22 mins   PT G Codes:        Colletta [place] 06/27/2016, 1:05 PM  Roney Marion, [place] Pager [phone] Office [phone]

## 2016-06-27 NOTE — Evaluation (Signed)
Occupational Therapy Evaluation Patient Details Name: Samantha Bishop MRN: [id number] DOB: 1925-02-10 Today's Date: 06/27/2016    History of Present Illness  Samantha Bishop is a 83 y.o. right hand dominant  female with medical history significant of HTN, HLD, DVT/PE, PAF on chronic anticoagulation of Xarelto, and optic neuralgia; who presents with complaints of headache. Patient notes symptoms started approximately 2 days ago after placement of her hearing aids.At baseline patient lives alone and is able to complete all of her ADLs including driving; on admission, pt stated significant difficulty keeping her balance and felt as though the room was spinning; Imaging revealed a subacute left PCA stroke; possible positive UTI   Clinical Impression   This 83 yo female admitted with above presents to acute OT with deficits below (see OT problem list) thus affecting her PLOF of being totally independent with basic and IADLs (including driving). I did make pt and family in room aware that she is not safe to drive or cook or be at home by herself due to visual field deficits and lack of awareness of these.     Follow Up Recommendations  SNF;Supervision/Assistance - 24 hour;Other (comment) (unless pt has 24 hour care at home (she is not safe to drive or cook) and lives alone)          Precautions / Restrictions Precautions Precautions: Fall Precaution Comments: Fall risk greatly reduced with use of assistive device Restrictions Weight Bearing Restrictions: No      Mobility Bed Mobility               General bed mobility comments: Pt up in recliner upon arrival with family in room  Transfers Overall transfer level: Needs assistance Equipment used: None Transfers: Sit to/from Stand Sit to Stand: Min guard         General transfer comment: Cues for hand placement and safety    Balance Overall balance assessment: Needs assistance Sitting-balance support: No upper extremity  supported;Feet supported Sitting balance-Leahy Scale: Good       Standing balance-Leahy Scale: Fair                              ADL Overall ADL's : Needs assistance/impaired Eating/Feeding: Supervision/ safety;Set up;Sitting   Grooming: Standing;Min guard   Upper Body Bathing: Supervision/ safety;Set up;Sitting   Lower Body Bathing: Sit to/from stand;Min guard   Upper Body Dressing : Supervision/safety;Set up;Sitting   Lower Body Dressing: Sit to/from stand;Min guard   Toilet Transfer: Min guard                   Vision Vision Assessment?: Yes Eye Alignment: Within Functional Limits Ocular Range of Motion: Within Functional Limits Alignment/Gaze Preference: Within Defined Limits Tracking/Visual Pursuits: Decreased smoothness of eye movement to RIGHT inferior field;Decreased smoothness of eye movement to RIGHT superior field (and looses tracking intermittently as she tracks to right ) Visual Fields: Right homonymous hemianopsia          Pertinent Vitals/Pain Pain Assessment: No/denies pain     Hand Dominance Right   Extremity/Trunk Assessment Upper Extremity Assessment Upper Extremity Assessment: Overall WFL for tasks assessed   Lower Extremity Assessment Lower Extremity Assessment: Overall WFL for tasks assessed       Communication Communication Communication: HOH   Cognition Arousal/Alertness: Awake/alert Behavior During Therapy: WFL for tasks assessed/performed Overall Cognitive Status: Impaired/Different from baseline Area of Impairment: Safety/judgement;Problem solving  Safety/Judgement: Decreased awareness of safety;Decreased awareness of deficits   Problem Solving: Requires verbal cues General Comments: Pt does not think she has visual deficits and thus is not aware of safety issues associated with this. When trying to find items asked of her on her right side she missed every single on; however at one point she actually  did look all the way to her right and found the kleenex that I had asked for eariler (but there was no this aha moment that, oh thats where the kleenex were).               Home Living Family/patient expects to be discharged to:: Private residence Living Arrangements: Alone Available Help at Discharge: Family;Friend(s);Available PRN/intermittently Type of Home: House Home Access: Stairs to enter [employer] of Steps: 4 Entrance Stairs-Rails: Right;Left Home Layout: One level               Home Equipment: Walker - 4 wheels          Prior Functioning/Environment Level of Independence: Independent        Comments: Drives, mows her lawn; very much values her independence        OT Problem List: Decreased cognition;Decreased safety awareness;Impaired vision/perception;Impaired balance (sitting and/or standing)   OT Treatment/Interventions: Self-care/ADL training;Therapeutic activities;Visual/perceptual remediation/compensation;Patient/family education;Balance training    OT Goals(Current goals can be found in the care plan section) Acute Rehab OT Goals Patient Stated Goal: Wants to be home ASAP OT Goal Formulation: With patient/family Time For Goal Achievement: 07/04/16 Potential to Achieve Goals: Good  OT Frequency: Min 2X/week   Barriers to D/C: Decreased caregiver support             End of Session Equipment Utilized During Treatment: Gait belt Nurse Communication:  (Pt not safe to go home alone due to visual field cut)  Activity Tolerance: Patient tolerated treatment well Patient left: in chair;with call bell/phone within reach;with chair alarm set   Time: 1324-1405 OT Time Calculation (min): 41 min Charges:  OT General Charges $OT Visit: 1 Procedure OT Evaluation $OT Eval Moderate Complexity: 1 Procedure OT Treatments $Self Care/Home Management : 23-37 mins  Almon Register [id number] 06/27/2016, 2:33 PM

## 2016-06-28 DIAGNOSIS — F4323 Adjustment disorder with mixed anxiety and depressed mood: Secondary | ICD-10-CM | POA: Diagnosis present

## 2016-06-28 DIAGNOSIS — Z79899 Other long term (current) drug therapy: Secondary | ICD-10-CM

## 2016-06-28 DIAGNOSIS — H53461 Homonymous bilateral field defects, right side: Secondary | ICD-10-CM

## 2016-06-28 DIAGNOSIS — I639 Cerebral infarction, unspecified: Secondary | ICD-10-CM

## 2016-06-28 DIAGNOSIS — Z888 Allergy status to other drugs, medicaments and biological substances status: Secondary | ICD-10-CM

## 2016-06-28 DIAGNOSIS — Z880 Allergy status to penicillin: Secondary | ICD-10-CM

## 2016-06-28 LAB — HEMOGLOBIN A1C
Hgb A1c MFr Bld: 6.3 % — ABNORMAL HIGH (ref 4.8–5.6)
Mean Plasma Glucose: 134 mg/dL

## 2016-06-28 MED ORDER — MAGNESIUM CITRATE PO SOLN: 0.5000 | Freq: Once | ORAL | Status: DC

## 2016-06-28 MED ORDER — POLYETHYLENE GLYCOL 3350 17 G PO PACK: 17.0000 g | PACK | Freq: Two times a day (BID) | ORAL | Status: DC

## 2016-06-28 MED ORDER — SENNOSIDES-DOCUSATE SODIUM 8.6-50 MG PO TABS: 2.0000 | ORAL_TABLET | Freq: Two times a day (BID) | ORAL | Status: DC
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
## 2016-06-28 NOTE — NC FL2 (Signed)
[place] LEVEL OF CARE SCREENING TOOL     IDENTIFICATION  Patient Name: Samantha Bishop Birthdate: 12-10-1924 Sex: female Admission Date (Current Location): 06/26/2016  [facility] and [place] Number:  Herbalist and Address:  [facility] [place], [place] [address], [place], [place] [address]      Provider Number: [id number]  Attending Physician Name and Address:  Albertine Patricia, MD  Relative Name and Phone Number:       Current Level of Care: Hospital Recommended Level of Care: [place] Prior Approval Number:    Date Approved/Denied:   PASRR Number:   [id number]  Discharge Plan: SNF    Current Diagnoses: Patient Active Problem List   Diagnosis Date Noted  . CVA (cerebral vascular accident) ([place]) 06/26/2016  . Right homonymous hemianopsia 06/26/2016  . Back pain 04/08/2015  . Abnormal CT scan 04/08/2015  . Constipation 01/02/2015  . Intracranial aneurysm 10/18/2014  . Routine general medical examination at a health care facility 03/08/2014  . Occipital neuralgia 07/16/2013  . Venous insufficiency 07/11/2013  . Alopecia 06/08/2012  . Osteopenia 02/21/2010  . ANXIETY 05/21/2009  . Essential hypertension 05/21/2009  . Atrial fibrillation - followed by Dr. Caryl Comes 03/03/2009  . INTRACRANIAL ANEURYSM 08/16/2007  . Hyperlipidemia 08/15/2007  . GERD 08/15/2007    Orientation RESPIRATION BLADDER Height & Weight     Self, Situation, Place  Normal Continent Weight: 186 lb 1.1 oz (84.4 kg) Height:  5\' 6"  (167.6 cm)  BEHAVIORAL SYMPTOMS/MOOD NEUROLOGICAL BOWEL NUTRITION STATUS      Continent Diet (regular)  AMBULATORY STATUS COMMUNICATION OF NEEDS Skin   Supervision Verbally Normal                       Personal Care Assistance Level of Assistance  Bathing, Feeding, Dressing Bathing Assistance: Limited assistance Feeding assistance: Independent Dressing Assistance: Limited assistance     Functional  Limitations Info  Sight, Hearing, Speech Sight Info: Adequate Hearing Info: Impaired Speech Info: Adequate    SPECIAL CARE FACTORS FREQUENCY  PT (By licensed PT), OT (By licensed OT), Speech therapy     PT Frequency: 5x OT Frequency: 5x     Speech Therapy Frequency: 5x      Contractures Contractures Info: Not present    Additional Factors Info  Code Status, Allergies Code Status Info: Full Code Allergies Info: Codeine, Penicillins           Current Medications (06/28/2016):  This is the current hospital active medication list Current Facility-Administered Medications  Medication Dose Route Frequency Provider Last Rate Last Dose  . acetaminophen (TYLENOL) tablet 650 mg  650 mg Oral Q4H PRN Norval Morton, MD       Or  . acetaminophen (TYLENOL) solution 650 mg  650 mg Per Tube Q4H PRN Norval Morton, MD       Or  . acetaminophen (TYLENOL) suppository 650 mg  650 mg Rectal Q4H PRN Norval Morton, MD      . apixaban (ELIQUIS) tablet 5 mg  5 mg Oral BID Albertine Patricia, MD   5 mg at 06/27/16 2153  . ciprofloxacin (CIPRO) tablet 500 mg  500 mg Oral QHS Albertine Patricia, MD   500 mg at 06/27/16 2153  . cycloSPORINE (RESTASIS) 0.05 % ophthalmic emulsion 1 drop  1 drop Both Eyes Daily PRN Norval Morton, MD      . gabapentin (NEURONTIN) capsule 300 mg  300  mg Oral Daily Norval Morton, MD   300 mg at 06/27/16 [id number]   And  . gabapentin (NEURONTIN) capsule 400 mg  400 mg Oral QHS Norval Morton, MD   400 mg at 06/27/16 2153  . HYDROcodone-acetaminophen (NORCO/VICODIN) 5-325 MG per tablet 1 tablet  1 tablet Oral Q4H PRN Rondell A Tamala Julian, MD      . polyethylene glycol (MIRALAX / GLYCOLAX) packet 17 g  17 g Oral Daily PRN Albertine Patricia, MD   17 g at 06/27/16 1824  . pravastatin (PRAVACHOL) tablet 20 mg  20 mg Oral q1800 David L Rinehuls, PA-C   20 mg at 06/27/16 [id number]     Discharge Medications: Please see discharge summary for a list of discharge  medications.  Relevant Imaging Results:  Relevant Lab Results:   Additional Information SSN:  [id number]  [place]illy [place], [place]

## 2016-06-28 NOTE — Progress Notes (Signed)
STROKE TEAM PROGRESS NOTE   HISTORY OF PRESENT ILLNESS (per record) Samantha Bishop is a 83 y.o. female with a history of atrial fibrillation who presents with new acute stroke. She states that she has not been feeling right since last Thursday, but really noticed vision problems more pronounced last night. Due to this, she presented to the emergency department today and had a CT of her head which demonstrates a left PCA territory infarct.  She also has been complaining about a left posterior headache which is been present since Thursday as well.  Of note, she consistently takes her Xarelto on a empty stomach.   LKW: Thursday tpa given?: no, out of window   SUBJECTIVE (INTERVAL HISTORY) Her son is   at bedside. She feels fine and wants to go home today.She endorses compliance with Xarelto and always takes it with supper. Discussed switching to Eliquis.    OBJECTIVE Temp:  [97.1 F (36.2 C)-98.6 F (37 C)] 97.1 F (36.2 C) (02/19 1128) Pulse Rate:  [82-97] 97 (02/19 1128) Cardiac Rhythm: Atrial fibrillation (02/19 0915) Resp:  [20] 20 (02/19 0100) BP: (123-158)/(66-93) 123/66 (02/19 1128) SpO2:  [95 %-99 %] 98 % (02/19 1128)  CBC:   Recent Labs Lab 06/26/16 1402 06/27/16 0508  WBC 8.6 6.6  HGB 13.1 12.0  HCT 40.3 37.2  MCV 90.4 89.9  PLT 246 [id number]    Basic Metabolic Panel:   Recent Labs Lab 06/26/16 1402 06/27/16 0508  NA 138 135  K 3.9 3.3*  CL 100* 102  CO2 29 25  GLUCOSE 94 102*  BUN 7 8  CREATININE 0.64 0.68  CALCIUM 9.7 9.1    Lipid Panel:     Component Value Date/Time   CHOL 157 06/27/2016 0508   TRIG 85 06/27/2016 0508   HDL 50 06/27/2016 0508   CHOLHDL 3.1 06/27/2016 0508   VLDL 17 06/27/2016 0508   LDLCALC 90 06/27/2016 0508   HgbA1c:  Lab Results  Component Value Date   HGBA1C 6.3 (H) 06/27/2016   Urine Drug Screen: No results found for: LABOPIA, COCAINSCRNUR, LABBENZ, AMPHETMU, THCU, LABBARB    IMAGING  Dg Chest 2  View 06/26/2016 Cardiac enlargement. No evidence of active pulmonary disease. Aortic atherosclerosis.    Ct Head Wo Contrast 06/26/2016 1. Acute to subacute left PCA territory infarct. MRI may prove helpful to assess acuity, as clinically warranted. No evidence for associated hemorrhage.  2. Stable appearance of old right parieto-occipital lobe infarct.  3. Chronic small vessel white matter ischemic disease.      Mr [place] and Neck W Wo Contrast 06/26/2016  MRI HEAD  1. Acute moderate sized left PCA territory infarct. Associated petechial hemorrhage without significant mass effect.  2. Remote right parietal and left cerebellar infarcts as above.  3. Generalized age-related cerebral atrophy with moderate chronic microvascular ischemic disease.   MRA HEAD   1. Attenuation of the distal left PCA, likely occluded given the acute left PCA territory infarct (left P3 segment).  2. Otherwise widely patent anterior and posterior circulation. No high-grade or correctable stenosis.  3. 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 Additional 2 mm focal outpouching arising at the takeoff of the left P com may reflect a small aneurysm or possibly vascular infundibulum.   MRA NECK  1. Short-segment mild atheromatous narrowing at the proximal right ICA. Otherwise widely patent right carotid artery system. 2. No significant atheromatous disease or luminal  narrowing within the left carotid artery system.  3. Widely patent vertebral arteries within the neck.   Physical exam: Exam: Gen: NAD, conversant, well nourised, obese, well groomed                     CV: RRR, no MRG. No Carotid Bruits. No peripheral edema, warm, nontender Eyes: Conjunctivae clear without exudates or hemorrhage  Neuro: Detailed Neurologic Exam  Speech:    Speech is normal; fluent and spontaneous with normal comprehension.  Cognition:     The patient is oriented to person, place, and time;  Cranial Nerves:    The pupils are equal, round, and reactive to light. Dense right homonymous hemianopia. Extraocular movements are intact. Trigeminal sensation is intact and the muscles of mastication are normal. The face is symmetric. The palate elevates in the midline. Hearing intact. Voice is normal. Shoulder shrug is normal. The tongue has normal motion without fasciculations.   Coordination:    Normal finger to nose.  Motor Observation:    No asymmetry, no atrophy, and no involuntary movements noted. Tone:    Normal muscle tone.    Posture:    Posture is normal. normal erect    Strength:    Strength is V/V in the upper and lower limbs.      Sensation: intact to LT     ASSESSMENT/PLAN Samantha Bishop is a 83 y.o. female with history of previous strokes, obstructive sleep apnea, pulmonary emboli, atrial fibrillation on Xarelto, hyperlipidemia, hypertension, known cerebral aneurysm, and anxiety presenting with headache and visual difficulties. She did not receive IV t-PA due to late presentation and anticoagulation.  Stroke: Left PCA infarct - embolic secondary to atrial fibrillation.  Resultant  Dense left heminopia  MRI -  Acute moderate sized left PCA territory infarct. Remote right parietal and left cerebellar infarcts.  MRA - Attenuation of the distal left PCA, likely occluded. Multiple aneurysms.  Carotid Doppler - MRA neck  2D Echo - pending  LDL - 90  HgbA1c - pending  VTE prophylaxis - SCDs Diet Heart Room service appropriate? Yes; Fluid consistency: Thin  Xarelto (rivaroxaban) daily prior to admission, now on aspirin 325 mg daily. Recommend discharge on Eliquis.   Patient counseled to be compliant with her antithrombotic medications  Ongoing aggressive stroke risk factor management  Therapy recommendations: pending  Disposition: Pending  Hypertension  Stable  Permissive hypertension (OK if <  220/120) but gradually normalize in 5-7 days  Long-term BP goal normotensive  Hyperlipidemia  Home meds: No lipid lowering medications prior to admission  LDL 90, goal < 70  Start Pravachol 20 mg daily  Continue statin at discharge    Other Stroke Risk Factors  Advanced age  ETOH use, advised to drink no more than 1 drink a day  Obesity, Body mass index is 30.03 kg/m., recommend weight loss, diet and exercise as appropriate   Hx stroke/TIA  Family hx stroke (father)  Atrial fibrillation - was not taking Xarelto correctly.   Other Active Problems  Stable 5 mm known aneurysm arising from the cavernous/supraclinoid left ICA .Recommend outpatient follow up with Dr. Delice Lesch Neurology outpatient who has followed with her in the past for this.   Hypokalemia - 3.3  Hospital day # 2  Personally examined patient and images, and have participated in and made any corrections needed to history, physical, neuro exam,assessment and plan as stated above.  I have personally obtained the history, evaluated lab date, reviewed imaging studies  and agree with radiology interpretations.  I have explained to the patient and her son that she should not drive unless her peripheral vision recovers significantly. Patient seems quite disappointed as she prides on being independent. I agree with changing Xarelto to eliquis but explained to the patient and son that is no definite data suggesting eliquis is superior. They understand. Greater than 50% time during this 25 minute visit was spent on counseling and coordination of care about atrial fibrillation, stroke risk, risk of hemorrhage with eliquis and answering questions  Antony Contras, MD Stroke Neurology [facility]  To contact Stroke Continuity provider, please refer to [email or web address] After hours, contact General Neurology

## 2016-06-28 NOTE — Care Management Note (Signed)
Case Management Note  Patient Details  Name: Samantha Bishop MRN: [id number] Date of Birth: 1925-02-01  Subjective/Objective:            Patient was admitted with CVA. Lives at home alone. CM will follow for discharge needs pending PT/OT evals and physician orders.         Action/Plan:   Expected Discharge Date:                  Expected Discharge Plan:     In-House Referral:     Discharge planning Services     Post Acute Care Choice:    Choice offered to:     DME Arranged:    DME Agency:     HH Arranged:    HH Agency:     Status of Service:     If discussed at [employer] of Stay Meetings, dates discussed:    Additional Comments:  Rolm Baptise, RN 06/28/2016, 9:11 AM

## 2016-06-28 NOTE — Progress Notes (Signed)
Physical Therapy Treatment Patient Details Name: Samantha Bishop MRN: [id number] DOB: [birth year] Today's Date: 06/28/2016    History of Present Illness  Samantha Bishop is a 83 y.o. right hand dominant  female with medical history significant of HTN, HLD, DVT/PE, PAF on chronic anticoagulation of Xarelto, and optic neuralgia; who presents with complaints of headache. Patient notes symptoms started approximately 2 days ago after placement of her hearing aids.At baseline patient lives alone and is able to complete all of her ADLs including driving; on admission, pt stated significant difficulty keeping her balance and felt as though the room was spinning; Imaging revealed a subacute left PCA stroke; possible positive UTI    PT Comments    Continuing work on gait and balance; score of 35/56 on Berg Balance assessment is indicative of high risk for falls; Difficulty with attending to R during hallway amb including one instance of colliding with small cart on pt's R; Hard to score a DGI, as Samantha Bishop had difficulty attending to objects in order to walk around them; continue to recommend SNF for rehab to maximize independence and safety with mobility and ADLs prior to return home   Follow Up Recommendations  SNF     Equipment Recommendations  Cane    Recommendations for Other Services       Precautions / Restrictions Precautions Precautions: Fall Precaution Comments: reports "dizzy" fall risk due to visional deficits    Mobility  Bed Mobility               General bed mobility comments: up in chair on arrival   Transfers Overall transfer level: Needs assistance Equipment used: None Transfers: Sit to/from Stand Sit to Stand: Min guard         General transfer comment: Noting heavy dependence on UE push and support with transfer  Ambulation/Gait Ambulation/Gait assistance: Min assist;Min guard Ambulation Distance (Feet): 80 Feet (cumulative, with initial attempt at  DGI assessment) Assistive device: Straight cane Gait Pattern/deviations: Step-through pattern;Decreased step length - right;Decreased step length - left;Decreased stride length     General Gait Details: needign cues to attend to objects, room numbers on Right; needing cues for pathfinding, and pt overshot her room and required cues to read numbers and find her room   Stairs Stairs: Yes   Stair Management: One rail Left;With cane;Step to pattern;Forwards Number of Stairs: 4 General stair comments: Dependent on UE support on rail for safety  Wheelchair Mobility    Modified Rankin (Stroke Patients Only) Modified Rankin (Stroke Patients Only) Pre-Morbid Rankin Score: No symptoms Modified Rankin: Slight disability     Balance     Sitting balance-Leahy Scale: Fair       Standing balance-Leahy Scale: Fair                      Cognition Arousal/Alertness: Awake/alert Behavior During Therapy: WFL for tasks assessed/performed Overall Cognitive Status: Impaired/Different from baseline Area of Impairment: Safety/judgement;Awareness;Problem solving         Safety/Judgement: Decreased awareness of safety;Decreased awareness of deficits Awareness: Emergent Problem Solving: Requires verbal cues General Comments: Noting emerging awareness of deficits and we discussed the link between vision, balance, and safety    Exercises      General Comments        Pertinent Vitals/Pain Pain Assessment: No/denies pain    Home Living                      Prior  Function            PT Goals (current goals can now be found in the care plan section) Acute Rehab PT Goals Patient Stated Goal: Wants to be home ASAP; seems to be more open to SNF at this point PT Goal Formulation: With patient Time For Goal Achievement: 07/04/16 Potential to Achieve Goals: Good Progress towards PT goals: Progressing toward goals    Frequency    Min 4X/week      PT Plan  Current plan remains appropriate    Co-evaluation             End of Session Equipment Utilized During Treatment: Gait belt Activity Tolerance: Patient tolerated treatment well Patient left: in chair;with call bell/phone within reach;with chair alarm set Nurse Communication: Mobility status PT Visit Diagnosis: Unsteadiness on feet (R26.81)     Time: [id number] PT Time Calculation (min) (ACUTE ONLY): 41 min  Charges:  $Gait Training: 23-37 mins $Therapeutic Activity: 8-22 mins                    G Codes:       Samantha Bishop 2016/07/06, 12:31 PM  Samantha Bishop, [place] Pager [phone] Office [phone]

## 2016-06-28 NOTE — Clinical Social Work Note (Signed)
Clinical Social Work Assessment  Patient Details  Name: Samantha Bishop MRN: [id number] Date of Birth: 1924/11/25  Date of referral:  06/28/16               Reason for consult:  Facility Placement, Discharge Planning                Permission sought to share information with:  Case Manager, Customer service manager, Family Supports Permission granted to share information::  Yes, Verbal Permission Granted  Name::        Agency::     Relationship::  Son in room at time of assessment, son is local to [employer] Information:     Housing/Transportation Living arrangements for the past 2 months:  Single Family Home Source of Information:  Patient, Medical Team, Case Manager, Adult Children Patient Interpreter Needed:  None Criminal Activity/Legal Involvement Pertinent to Current Situation/Hospitalization:  No - Comment as needed Significant Relationships:  Adult Children, Other Family Members Lives with:  Self Do you feel safe going back to the place where you live?  No Need for family participation in patient care:  Yes (Comment)  Care giving concerns:  Prior to hospitalization, patient was independent with all ADLs including cooking, cleaning and driving.  Due to admission and CVA, patient's baseline has changed and she is not having vision problems and unable to return to previous state, including driving.  Patient is being recommended to ST SNF due to balance deficits and vision deficits to ensure cueing and coping mechanisms.  Patient per medical team is a significant fall risk due to new onset deficits.   Social Worker assessment / plan:  LCSW met with patient and son at bedside.  Patient very tearful regarding news of being unable to drive and not able to go home quite yet, with recommendation being SNF.  She has difficulty hearing per report and son is able to provide assistance during assessment. LCSW discussed recommendation in which patient is agreeable for  placement as long as it is not [facility] or [facility]. LCSW ensured SNF work up would leave both facilities off the referrals. Patient requesting [facility] and other bed offers after referrals completed. Son available to assist with needs as well per report.  Plan: LCSW completed SNF work up Will follow up with bed offers. Discharge tentative for Tuesday.  Employment status:  Retired Forensic scientist:  Medicare PT Recommendations:   SNF Information / Referral to community resources:  [facility]  Patient/Family's Response to care:  Agreeable to plan  Patient/Family's Understanding of and Emotional Response to Diagnosis, Current Treatment, and Prognosis:  Patient very distraught and tearful due to unable to drive anymore and vision problems.  Patient prides herself on being independent and will struggle with accepting assistance and new baseline with ADLs.  Emotional Assessment Appearance:  Appears stated age Attitude/Demeanor/Rapport:  Apprehensive Affect (typically observed):  Tearful/Crying, Overwhelmed Orientation:  Oriented to Self, Oriented to Place, Oriented to Situation Alcohol / Substance use:  Not Applicable Psych involvement (Current and /or in the community):  No (Comment)  Discharge Needs  Concerns to be addressed:  No discharge needs identified Readmission within the last 30 days:  No Current discharge risk:  None Barriers to Discharge:  Continued Medical Work up   Lilly [place], LCSW 06/28/2016, 10:30 AM

## 2016-06-28 NOTE — Progress Notes (Signed)
Occupational Therapy Treatment Patient Details Name: Samantha Bishop MRN: [id number] DOB: 1925-04-06 Today's Date: 06/28/2016    History of present illness  Samantha Bishop is a 83 y.o. right hand dominant  female with medical history significant of HTN, HLD, DVT/PE, PAF on chronic anticoagulation of Xarelto, and optic neuralgia; who presents with complaints of headache. Patient notes symptoms started approximately 2 days ago after placement of her hearing aids.At baseline patient lives alone and is able to complete all of her ADLs including driving; on admission, pt stated significant difficulty keeping her balance and felt as though the room was spinning; Imaging revealed a subacute left PCA stroke; possible positive UTI   OT comments  Pt very upset about pending SNF placement and tearful. Pt agreeable to d/c SNF but very much plans to return to driving. Pt will benefit from follow up appointment with eye doctor of her choosing. Pt currently lacks awareness of visual deficits and defers back to last appointment with personal MD regarding her vision. Pt initially reports that her testing was incorrect due to lack of glasses but during functional task pt d/c glasses and states she only wears them for reading. Pt noted to hold objects out of midline and head tilts.   Follow Up Recommendations  SNF;Supervision/Assistance - 24 hour    Equipment Recommendations  None recommended by OT    Recommendations for Other Services      Precautions / Restrictions Precautions Precautions: Fall Precaution Comments: reports "dizzy" fall risk due to visional deficits       Mobility Bed Mobility               General bed mobility comments: up in chair on arrival   Transfers Overall transfer level: Needs assistance   Transfers: Sit to/from Stand Sit to Stand: Min assist         General transfer comment: hand held (A) to stedy to the sink level    Balance Overall balance assessment: Needs  assistance Sitting-balance support: No upper extremity supported;Feet supported Sitting balance-Leahy Scale: Fair     Standing balance support: Single extremity supported;During functional activity Standing balance-Leahy Scale: Fair Standing balance comment: pt with LOB with turning to locate sink MOD (A) to correct                    ADL Overall ADL's : Needs assistance/impaired Eating/Feeding: Modified independent;Sitting Eating/Feeding Details (indicate cue type and reason): pt noted to have additional head turns to see the entire plate Grooming: Wash/dry hands;Wash/dry face;Oral care;Applying deodorant;Brushing hair;Minimal assistance;Sitting;Standing Grooming Details (indicate cue type and reason): pt with posterior LOB x1 with mod (A) to catch patient for safety. pt needed mod cues for scanning to locate items Upper Body Bathing: Minimal assistance;Standing Upper Body Bathing Details (indicate cue type and reason): pt min guard sitting and cues for safety  Lower Body Bathing: Minimal assistance       Lower Body Dressing: Moderate assistance Lower Body Dressing Details (indicate cue type and reason): pt unable to thread L LE into pants and required (A) to pull pants up completing due to UE support on sink surface             Functional mobility during ADLs: Minimal assistance General ADL Comments: pt with slow decr gait velocity with hands slightly extended in a guarded position due to visual deficits. pt educated that clothing in closet to the L. pt states "thats a trash can in there" OT repeating statement back to  patient and pt states " yeah a trash can" Pt educated that a white grocery like bag was present with clothing. pt then reaching for bag and states "oh yeah it is" Pt needed cues to locate items on L side of sink for adl and reports some dizziness with unsteady balance. Pt lacks awareness to visual deficits. pt reports "i have had my vision checked multiple times  and they tell me that my vision is fine everytimes. I have always had good reports. My doctor even told me I dont need new glasses" Pt very fixated on safety of the home and returning to driving. pt does not want to leave her personal items and does not trust son to gather clothing without her. pt states "he doesnt know my things. I have to go with him to get my stuff" pt also reports to MD "you talk to me! I dont have a POA. I am making my decisions I am to know what is happening"      Vision                 Additional Comments: pt attempting to call niece Demica Sugai  with inability to dial phone. pt dialing the phone number incorrectly but lack of awareness. pt attempting to dial [phone] and dialing [phone]. Pt attempting to tell niece on the phone " got me married" instead of "got me worried" Pt stating "got me married " twice and recognized error and corrected. Pt reports "i hated [facility] and then i went to [facility] and it was a good place " Pt reports medication name in error of facility name and lack of awareness to error. pt very fixated on lack of personal items to go to facility. pt reports to family member "i have had a stroke and i didnt call anyone yet to tell him"    Perception     Praxis      Cognition   Behavior During Therapy: WFL for tasks assessed/performed Overall Cognitive Status: Impaired/Different from baseline Area of Impairment: Safety/judgement;Awareness;Problem solving          Safety/Judgement: Decreased awareness of safety;Decreased awareness of deficits Awareness: Emergent Problem Solving: Requires verbal cues General Comments: pt has ability to problem solves and reports my friend drives with one glass eye i can read so why shouldnt I? pt reports to the MD " you are taking everything I am not giving up my house and my brand new car" pt very fearful of something happening to the house in her abscents. pt reports "i have no relatives. I see my son once a year, my  daughter in [place] once every 5 years and so what good are they"    Extremity/Trunk Assessment               Exercises     Shoulder Instructions       General Comments      Pertinent Vitals/ Pain       Pain Assessment: No/denies pain  Home Living                                          Prior Functioning/Environment              Frequency  Min 2X/week        Progress Toward Goals  OT Goals(current goals can now be found in the care plan section)  Progress towards OT goals: Progressing toward goals  Acute Rehab OT Goals Patient Stated Goal: Wants to be home ASAP OT Goal Formulation: With patient/family Time For Goal Achievement: 07/04/16 Potential to Achieve Goals: Good ADL Goals Pt Will Perform Grooming: with set-up;with supervision;standing Pt Will Perform Upper Body Bathing: with supervision;with set-up;standing Pt Will Perform Lower Body Bathing: with set-up;with supervision;sit to/from stand Pt Will Perform Upper Body Dressing: with set-up;with supervision;sitting;standing Pt Will Perform Lower Body Dressing: with set-up;with supervision;sit to/from stand Pt Will Transfer to Toilet: with supervision;ambulating;bedside commode Additional ADL Goal #1: Pt will work on [employer], manufacturing for right visual field cut  Plan Discharge plan remains appropriate    Co-evaluation                 End of Session Equipment Utilized During Treatment: Gait belt   Activity Tolerance Patient tolerated treatment well   Patient Left in chair;with call bell/phone within reach;with chair alarm set;with family/visitor present   Nurse Communication Mobility status;Precautions        Time: [id number] OT Time Calculation (min): 68 min  Charges: OT General Charges $OT Visit: 1 Procedure OT Treatments $Self Care/Home Management : 68-82 mins  Parke Poisson B 06/28/2016, 10:06 AM    Jeri Modena   OTR/L PagerIP:[phone] Office:  [phone] .

## 2016-06-28 NOTE — Progress Notes (Addendum)
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 intensity within normal limits. No scalp soft tissue abnormality. Globes and orbital soft tissues within normal limits. Patient is status post lens extraction bilaterally. Scattered mucosal thickening within the ethmoidal air cells. Retention cyst noted within the left maxillary sinus. Paranasal sinuses are otherwise clear. No mastoid effusion. Inner ear structures grossly normal. MRA HEAD FINDINGS ANTERIOR CIRCULATION: Distal cervical segments of the internal carotid arteries are patent with antegrade flow. Petrous, cavernous, and supraclinoid segments widely patent bilaterally without flow-limiting stenosis. 5 mm saccular aneurysm arises from the cavernous/ supraclinoid left ICA (series 5, image 98). Additional tiny 2 mm focal outpouching arising at the takeoff of the left P com may reflect an additional small aneurysm or vascular infundibulum (series 5, image 85). A1 segments patent. Probable additional 2 mm aneurysm arising from the anterior communicating artery the the (series 5, image 71). Anterior  communicating arteries widely patent distally. M1 segments patent without stenosis or occlusion. Left MCA bifurcates early. Distal MCA branches well opacified and symmetric. POSTERIOR CIRCULATION: Vertebral arteries patent to the vertebrobasilar junction without significant stenosis. Right vertebral artery is dominant, with slightly diminutive left vertebral artery. No findings to suggest dissection. Posterior inferior cerebral arteries patent proximally. Basilar artery widely patent. Superior cerebral arteries patent bilaterally. Hypoplastic right P1 segment with prominent widely patent right posterior communicating artery. Left PCA largely supplied via the basilar artery. Right PCA widely patent to its distal aspect. Left PCA is attenuated distally, likely occluded given the left PCA territory infarct. MRA NECK FINDINGS Visualized aortic arch of normal caliber with normal 3 vessel morphology. No high-grade stenosis seen at the origin of the great vessels. Partially visualized subclavian arteries widely patent. Right common carotid artery widely patent from its origin to the bifurcation. Mild atheromatous irregularity about the right bifurcation/proximal right ICA with associated mild stenosis of the proximal right ICA. Right ICA patent distally to the skullbase without stenosis or occlusion. Left common carotid artery patent from its origin to the bifurcation. Minimal atheromatous irregularity about the left bifurcation without significant narrowing. Left ICA patent distally to  the skullbase without stenosis or occlusion. Both of the vertebral arteries arise from the subclavian arteries. Right vertebral artery slightly dominant. Vertebral arteries are tortuous proximally. Vertebral arteries patent within the neck without significant stenosis or occlusion. No obvious evidence for dissection. Atheromatous irregularity with tortuosity noted within the distal left V2 segment. IMPRESSION: MRI HEAD IMPRESSION: 1. Acute  moderate sized left PCA territory infarct. Associated petechial hemorrhage without significant mass effect. 2. Remote right parietal and left cerebellar infarcts as above. 3. Generalized age-related cerebral atrophy with moderate chronic microvascular ischemic disease. MRA HEAD IMPRESSION: 1. Attenuation of the distal left PCA, likely occluded given the acute left PCA territory infarct (left P3 segment). 2. Otherwise widely patent anterior and posterior circulation. No high-grade or correctable stenosis. 3. 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 Additional 2 mm focal outpouching arising at the takeoff of the left P com may reflect a small aneurysm or possibly vascular infundibulum. MRA NECK IMPRESSION: 1. Short-segment mild atheromatous narrowing at the proximal right ICA. Otherwise widely patent right carotid artery system. 2. No significant atheromatous disease or luminal narrowing within the left carotid artery system. 3. Widely patent vertebral arteries within the neck. Electronically Signed   By: Jeannine Boga M.D.   On: 06/26/2016 23:43     Samantha Bishop M.D on 06/28/2016 at 12:38 PM  Between 7am to 7pm - Pager - [phone]  After 7pm go to [email or web address] - password [facility]  [facility] -  Office  [phone]

## 2016-06-28 NOTE — Progress Notes (Signed)
  Speech Language Pathology Treatment: Cognitive-Linquistic  Patient Details Name: Samantha Bishop MRN: [id number] DOB: Dec 01, 1924 Today's Date: 06/28/2016 Time: [id number] SLP Time Calculation (min) (ACUTE ONLY): 23 min  Assessment / Plan / Recommendation Clinical Impression  Skilled treatment focusing on insight/reasoning re: deficits; portions of MOCA re-administered with increased score; ? HOH impacting score initially; no family available to discuss PLOF; pt with decreased awareness of current deficits noting "I don't understand why if my vision has changed a little why I can't drive"; SLP discussed reasoning/judgment regarding driving and pt still demonstrated decreased awareness/insight; pt stated she had a 10th grade education and this was why she couldn't answer certain questions.  Executive functioning skills decreased for various tasks; concern for safety at home d/t living alone and being primarily independent prior to this hospitalization; will continue to f/u while in acute setting.  HPI HPI: Samantha Bishop a 83 y.o.femalewith ahistory of atrial fibrillation who presents with new acute stroke.She states that she has not been feeling right since last Thursday, but really noticed vision problemsmore pronounced last night. Due to this, she presented to the emergency department today and had a MRI of her brain demonstrates a moderate left PCA territory infarct as well as remote right parietal and left cerebellar infarcts.       SLP Plan  Continue with current plan of care       Recommendations                   General recommendations: Other(comment) (TBD) Follow up Recommendations: 24 hour supervision/assistance SLP Visit Diagnosis: Cognitive communication deficit [id number] Plan: Continue with current plan of care                      Stephana Morell,PAT, M.S., CCC-SLP 06/28/2016, 5:07 PM

## 2016-06-28 NOTE — Clinical Social Work Placement (Signed)
   CLINICAL SOCIAL WORK PLACEMENT  NOTE  Date:  06/28/2016  Patient Details  Name: Samantha Bishop MRN: [id number] Date of Birth: March 15, 1925  Clinical Social Work is seeking post-discharge placement for this patient at the [place] level of care (*CSW will initial, date and re-position this form in  chart as items are completed):  Yes   Patient/family provided with [place] Work Department's list of facilities offering this level of care within the geographic area requested by the patient (or if unable, by the patient's family).  Yes   Patient/family informed of their freedom to choose among providers that offer the needed level of care, that participate in Medicare, Medicaid or managed care program needed by the patient, have an available bed and are willing to accept the patient.  Yes   Patient/family informed of [facility]'s ownership interest in [facility] and [facility], as well as of the fact that they are under no obligation to receive care at these facilities.  PASRR submitted to EDS on       PASRR number received on       Existing PASRR number confirmed on 06/28/16     FL2 transmitted to all facilities in geographic area requested by pt/family on 06/28/16     FL2 transmitted to all facilities within larger geographic area on       Patient informed that his/her managed care company has contracts with or will negotiate with certain facilities, including the following:            Patient/family informed of bed offers received.  Patient chooses bed at       Physician recommends and patient chooses bed at      Patient to be transferred to   on  .  Patient to be transferred to facility by       Patient family notified on   of transfer.  Name of family member notified:        PHYSICIAN Please sign FL2     Additional Comment:    _______________________________________________ Lilly Cove, LCSW 06/28/2016, 10:29  AM

## 2016-06-29 DIAGNOSIS — K5900 Constipation, unspecified: Secondary | ICD-10-CM | POA: Diagnosis not present

## 2016-06-29 DIAGNOSIS — R278 Other lack of coordination: Secondary | ICD-10-CM | POA: Diagnosis not present

## 2016-06-29 DIAGNOSIS — H04129 Dry eye syndrome of unspecified lacrimal gland: Secondary | ICD-10-CM | POA: Diagnosis not present

## 2016-06-29 DIAGNOSIS — I6789 Other cerebrovascular disease: Secondary | ICD-10-CM | POA: Diagnosis not present

## 2016-06-29 DIAGNOSIS — R531 Weakness: Secondary | ICD-10-CM | POA: Diagnosis not present

## 2016-06-29 DIAGNOSIS — I63332 Cerebral infarction due to thrombosis of left posterior cerebral artery: Secondary | ICD-10-CM | POA: Diagnosis not present

## 2016-06-29 DIAGNOSIS — M792 Neuralgia and neuritis, unspecified: Secondary | ICD-10-CM | POA: Diagnosis not present

## 2016-06-29 DIAGNOSIS — N390 Urinary tract infection, site not specified: Secondary | ICD-10-CM | POA: Diagnosis not present

## 2016-06-29 DIAGNOSIS — G8929 Other chronic pain: Secondary | ICD-10-CM | POA: Diagnosis not present

## 2016-06-29 DIAGNOSIS — M6281 Muscle weakness (generalized): Secondary | ICD-10-CM | POA: Diagnosis not present

## 2016-06-29 DIAGNOSIS — I69398 Other sequelae of cerebral infarction: Secondary | ICD-10-CM | POA: Diagnosis not present

## 2016-06-29 DIAGNOSIS — I4891 Unspecified atrial fibrillation: Secondary | ICD-10-CM | POA: Diagnosis not present

## 2016-06-29 DIAGNOSIS — I10 Essential (primary) hypertension: Secondary | ICD-10-CM | POA: Diagnosis not present

## 2016-06-29 DIAGNOSIS — E785 Hyperlipidemia, unspecified: Secondary | ICD-10-CM | POA: Diagnosis not present

## 2016-06-29 DIAGNOSIS — I480 Paroxysmal atrial fibrillation: Secondary | ICD-10-CM

## 2016-06-29 DIAGNOSIS — I69312 Visuospatial deficit and spatial neglect following cerebral infarction: Secondary | ICD-10-CM | POA: Diagnosis not present

## 2016-06-29 DIAGNOSIS — R262 Difficulty in walking, not elsewhere classified: Secondary | ICD-10-CM | POA: Diagnosis not present

## 2016-06-29 LAB — URINE CULTURE: Culture: >=100000 — AB

## 2016-06-29 MED ORDER — POLYETHYLENE GLYCOL 3350 17 G PO PACK: 17.0000 g | PACK | Freq: Every day | ORAL | 0 refills | Status: DC | PRN

## 2016-06-29 MED ORDER — HYDROCODONE-ACETAMINOPHEN 5-325 MG PO TABS: 1.0000 | ORAL_TABLET | ORAL | 0 refills | Status: DC | PRN

## 2016-06-29 MED ORDER — CIPROFLOXACIN HCL 500 MG PO TABS: 500.0000 mg | ORAL_TABLET | Freq: Every day | ORAL | Status: AC

## 2016-06-29 MED ORDER — APIXABAN 5 MG PO TABS: 5.0000 mg | ORAL_TABLET | Freq: Two times a day (BID) | ORAL | Status: DC

## 2016-06-29 MED ORDER — PRAVASTATIN SODIUM 20 MG PO TABS: 20.0000 mg | ORAL_TABLET | Freq: Every day | ORAL | Status: DC

## 2016-06-29 MED ORDER — HYDROCHLOROTHIAZIDE 25 MG PO TABS: 12.5000 mg | ORAL_TABLET | Freq: Every day | ORAL | 0 refills | Status: DC

## 2016-06-29 NOTE — Progress Notes (Signed)
Clinical Social Worker facilitated patient discharge including contacting patient family and facility to confirm patient discharge plans.  Clinical information faxed to facility and family agreeable with plan.  CSW arranged ambulance transport via [facility] to [facility] .  RN Tammy to call [phone] (rm# [phone])  report prior to discharge.  Clinical Social Worker will sign off for now as social work intervention is no longer needed. Please consult [place] again if new need arises.  Rhea Pink, MSW, [place]

## 2016-06-29 NOTE — Progress Notes (Signed)
STROKE TEAM PROGRESS NOTE   HISTORY OF PRESENT ILLNESS (per record) Samantha Bishop is a 83 y.o. female with a history of atrial fibrillation who presents with new acute stroke. She states that she has not been feeling right since last Thursday, but really noticed vision problems more pronounced last night. Due to this, she presented to the emergency department today and had a CT of her head which demonstrates a left PCA territory infarct.  She also has been complaining about a left posterior headache which is been present since Thursday as well.  Of note, she consistently takes her Xarelto on a empty stomach.   LKW: Thursday tpa given?: no, out of window   SUBJECTIVE (INTERVAL HISTORY) Her family is not  at bedside. She feels fine and wants to go home today.She is Medically stable to be discharged to skilled nursing facility  On eliquis.    OBJECTIVE Temp:  [97.4 F (36.3 C)-98.9 F (37.2 C)] 98.3 F (36.8 C) (02/20 1347) Pulse Rate:  [82-90] 82 (02/20 1347) Cardiac Rhythm: Atrial fibrillation (02/20 0700) Resp:  [16-19] 18 (02/20 1347) BP: (125-144)/(53-77) 135/67 (02/20 1347) SpO2:  [96 %-99 %] 99 % (02/20 1347)  CBC:   Recent Labs Lab 06/26/16 1402 06/27/16 0508  WBC 8.6 6.6  HGB 13.1 12.0  HCT 40.3 37.2  MCV 90.4 89.9  PLT 246 [id number]    Basic Metabolic Panel:   Recent Labs Lab 06/26/16 1402 06/27/16 0508  NA 138 135  K 3.9 3.3*  CL 100* 102  CO2 29 25  GLUCOSE 94 102*  BUN 7 8  CREATININE 0.64 0.68  CALCIUM 9.7 9.1    Lipid Panel:     Component Value Date/Time   CHOL 157 06/27/2016 0508   TRIG 85 06/27/2016 0508   HDL 50 06/27/2016 0508   CHOLHDL 3.1 06/27/2016 0508   VLDL 17 06/27/2016 0508   LDLCALC 90 06/27/2016 0508   HgbA1c:  Lab Results  Component Value Date   HGBA1C 6.3 (H) 06/27/2016   Urine Drug Screen: No results found for: LABOPIA, COCAINSCRNUR, LABBENZ, AMPHETMU, THCU, LABBARB    IMAGING  Dg Chest 2  View 06/26/2016 Cardiac enlargement. No evidence of active pulmonary disease. Aortic atherosclerosis.    Ct Head Wo Contrast 06/26/2016 1. Acute to subacute left PCA territory infarct. MRI may prove helpful to assess acuity, as clinically warranted. No evidence for associated hemorrhage.  2. Stable appearance of old right parieto-occipital lobe infarct.  3. Chronic small vessel white matter ischemic disease.      Mr [facility] and Neck W Wo Contrast 06/26/2016  MRI HEAD  1. Acute moderate sized left PCA territory infarct. Associated petechial hemorrhage without significant mass effect.  2. Remote right parietal and left cerebellar infarcts as above.  3. Generalized age-related cerebral atrophy with moderate chronic microvascular ischemic disease.   MRA HEAD   1. Attenuation of the distal left PCA, likely occluded given the acute left PCA territory infarct (left P3 segment).  2. Otherwise widely patent anterior and posterior circulation. No high-grade or correctable stenosis.  3. 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 Additional 2 mm focal outpouching arising at the takeoff of the left P com may reflect a small aneurysm or possibly vascular infundibulum.   MRA NECK  1. Short-segment mild atheromatous narrowing at the proximal right ICA. Otherwise widely patent right carotid artery system. 2. No significant atheromatous disease or luminal narrowing  within the left carotid artery system.  3. Widely patent vertebral arteries within the neck.   Physical exam: Exam: Gen: NAD, conversant, well nourised, obese, well groomed                     CV: RRR, no MRG. No Carotid Bruits. No peripheral edema, warm, nontender Eyes: Conjunctivae clear without exudates or hemorrhage  Neuro: Detailed Neurologic Exam  Speech:    Speech is normal; fluent and spontaneous with normal comprehension.  Cognition:     The patient is oriented to person, place, and time;  Cranial Nerves:    The pupils are equal, round, and reactive to light. Dense right homonymous hemianopia. Extraocular movements are intact. Trigeminal sensation is intact and the muscles of mastication are normal. The face is symmetric. The palate elevates in the midline. Hearing intact. Voice is normal. Shoulder shrug is normal. The tongue has normal motion without fasciculations.   Coordination:    Normal finger to nose.  Motor Observation:    No asymmetry, no atrophy, and no involuntary movements noted. Tone:    Normal muscle tone.    Posture:    Posture is normal. normal erect    Strength:    Strength is V/V in the upper and lower limbs.      Sensation: intact to LT     ASSESSMENT/PLAN Ms. Samantha Bishop is a 83 y.o. female with history of previous strokes, obstructive sleep apnea, pulmonary emboli, atrial fibrillation on Xarelto, hyperlipidemia, hypertension, known cerebral aneurysm, and anxiety presenting with headache and visual difficulties. She did not receive IV t-PA due to late presentation and anticoagulation.  Stroke: Left PCA infarct - embolic secondary to atrial fibrillation.  Resultant  Dense left heminopia  MRI -  Acute moderate sized left PCA territory infarct. Remote right parietal and left cerebellar infarcts.  MRA - Attenuation of the distal left PCA, likely occluded. Multiple aneurysms.  Carotid Doppler - MRA neck 2D Echo - Left ventricle: The cavity size was normal. Wall thickness was   increased in a pattern of mild LVH. Systolic function was normal.   The estimated ejection fraction was in the range of 60% to 65%.   Wall motion was normal; there were no regional wall motion    abnormalities.LDL - 90  HgbA1c - 6.3  VTE prophylaxis - SCDs Diet Heart Room service appropriate? Yes; Fluid consistency: Thin  Xarelto (rivaroxaban) daily prior to admission, now on aspirin 325 mg daily. Recommend  discharge on Eliquis.   Patient counseled to be compliant with her antithrombotic medications  Ongoing aggressive stroke risk factor management  Therapy recommendations:snf Disposition: snf Hypertension  Stable  Permissive hypertension (OK if < 220/120) but gradually normalize in 5-7 days  Long-term BP goal normotensive  Hyperlipidemia  Home meds: No lipid lowering medications prior to admission  LDL 90, goal < 70  Started Pravachol 20 mg daily  Continue statin at discharge    Other Stroke Risk Factors  Advanced age  ETOH use, advised to drink no more than 1 drink a day  Obesity, Body mass index is 30.03 kg/m., recommend weight loss, diet and exercise as appropriate   Hx stroke/TIA  Family hx stroke (father)  Atrial fibrillation - was not taking Xarelto correctly.   Other Active Problems  Stable 5 mm known aneurysm arising from the cavernous/supraclinoid left ICA .Recommend outpatient follow up with Dr. Delice Lesch Neurology outpatient who has followed with her in the past for this.   Hypokalemia -  3.[facility] day # 3  Personally examined patient and images, and have participated in and made any corrections needed to history, physical, neuro exam,assessment and plan as stated above.  I have personally obtained the history, evaluated lab date, reviewed imaging studies and agree with radiology interpretations.  I have explained to the patient and her son that she should not drive unless her peripheral vision recovers significantly. . Continue eliquis for stroke prevention. Transfer to skilled nursing facility when bed available. Follow-up as an outpatient in stroke clinic in  6 weeks. Stroke team will sign off at the present time.  Antony Contras, MD Stroke Neurology [facility]  To contact Stroke Continuity provider, please refer to [email or web address] After hours, contact General Neurology

## 2016-06-29 NOTE — Consult Note (Signed)
[facility] Primary Care Navigator  06/29/2016  Samantha Bishop 1924-09-29 [id number]  Met with patient and son Samantha Bishop) at the bedside to identify possible discharge needs. Patient reports she had "trembling", confusion and felt "swimmy headed" that had led to this admission.  Patient endorses Dr. Pricilla Holm with [facility] [place] as the primary care provider but getting ready to change to a female doctor from the same group as stated.   Patient shared using [facility] to obtain medications without any problem.   Patient states managing her own medications at home straight out of the containers.  She reports being able to drive prior to admission but son will be able to provide transportation to her doctors' appointments when needed. Patient reports that she was independent with self care at home and was living alone. Son lives close by.  Discharge plan is to skilled nursing facility at [facility] for short term rehabilitation prior to going home or going to ALF (Assisted Living facility) per son. Son will be able to assist with her needs if needed.  Patient and son voiced understanding to call primary care provider's office when discharged from rehab facility for a post discharge follow-up appointment within a week or sooner if needs arise. Patient letter (with PCP's contact number) was provided as a reminder.  Both denied any other needs or concerns at this time.  For additional questions please contact:  Samantha Bishop, BSN, RN-BC [facility] PRIMARY CARE Navigator Cell: [phone]

## 2016-06-29 NOTE — Progress Notes (Signed)
Physical Therapy Treatment Patient Details Name: FUTURE HAUS MRN: [id number] DOB: [birth year] Today's Date: 06/29/2016    History of Present Illness  Samantha Bishop is a 83 y.o. right hand dominant  female with medical history significant of HTN, HLD, DVT/PE, PAF on chronic anticoagulation of Xarelto, and optic neuralgia; who presents with complaints of headache. Patient notes symptoms started approximately 2 days ago after placement of her hearing aids.At baseline patient lives alone and is able to complete all of her ADLs including driving; on admission, pt stated significant difficulty keeping her balance and felt as though the room was spinning; Imaging revealed a subacute left PCA stroke; possible positive UTI    PT Comments    Patient continues to make good progress. She increased gait distance and tolerated balance training well. She used a walker today because therapy could not locate a cane. She is motivated to continue to improve her balance and gait distance. Her plan remains the same. She would benefit from skilled rehab at a SNF as well as further skilled acute therapy.    Follow Up Recommendations  SNF     Equipment Recommendations  Cane    Recommendations for Other Services       Precautions / Restrictions Precautions Precautions: Fall Precaution Comments: reports "dizzy" fall risk due to visional deficits Restrictions Weight Bearing Restrictions: No    Mobility  Bed Mobility               General bed mobility comments: up in chair on arrival   Transfers Overall transfer level: Needs assistance Equipment used: None Transfers: Sit to/from Stand Sit to Stand: Min guard         General transfer comment: Noting heavy dependence on UE push and support with transfer  Ambulation/Gait Ambulation/Gait assistance: Min guard Ambulation Distance (Feet): 100 Feet Assistive device: Rolling walker (2 wheeled) (could not locate cane) Gait  Pattern/deviations: Step-through pattern;Decreased step length - right;Decreased step length - left;Decreased stride length     General Gait Details: Improved management of objects in the hallway. Able to ambualte on a straight path using the rolling walker   Stairs            Wheelchair Mobility    Modified Rankin (Stroke Patients Only) Modified Rankin (Stroke Patients Only) Pre-Morbid Rankin Score: No symptoms Modified Rankin: Slight disability     Balance Overall balance assessment: Needs assistance Sitting-balance support: No upper extremity supported;Feet supported Sitting balance-Leahy Scale: Fair       Standing balance-Leahy Scale: Fair Standing balance comment: Improved balance with balance activity today                    Cognition Arousal/Alertness: Awake/alert Behavior During Therapy: WFL for tasks assessed/performed Overall Cognitive Status: Impaired/Different from baseline Area of Impairment: Safety/judgement;Awareness;Problem solving         Safety/Judgement: Decreased awareness of safety;Decreased awareness of deficits Awareness: Emergent Problem Solving: Requires verbal cues General Comments: Noting emerging awareness of deficits and we discussed the link between vision, balance, and safety    Exercises Other Exercises Other Exercises: Balance: tandem stance Min -> mod a to remain balanced. Patient lost balance to the side of the foot that was behind her 1x30sec hold. Standing ; Narrow base of support 1x30sec eyes opened / eyes closed; standing head rotation x5 each side looking up and down 5x: No LOB     General Comments        Pertinent Vitals/Pain Pain Assessment: No/denies pain  Home Living                      Prior Function            PT Goals (current goals can now be found in the care plan section) Acute Rehab PT Goals Patient Stated Goal: Wants to be home ASAP; seems to be more open to SNF at this point PT  Goal Formulation: With patient Time For Goal Achievement: 07/04/16 Potential to Achieve Goals: Good Progress towards PT goals: Progressing toward goals    Frequency    Min 4X/week      PT Plan Current plan remains appropriate    Co-evaluation             End of Session Equipment Utilized During Treatment: Gait belt Activity Tolerance: Patient tolerated treatment well Patient left: in chair;with call bell/phone within reach;with chair alarm set Nurse Communication: Mobility status PT Visit Diagnosis: Unsteadiness on feet (R26.81)     Time: [id number] PT Time Calculation (min) (ACUTE ONLY): 20 min  Charges:  $Gait Training: 8-22 mins                    G Codes:       Carney Living PT DPT  06/29/2016, 11:45 AM

## 2016-06-29 NOTE — Discharge Instructions (Signed)
Follow with Primary MD Hoyt Koch, MD after discharge from SNF - Patient was instructed to stop driving  Get CBC, CMP,  checked  by Primary MD next visit.    Activity: As tolerated with Full fall precautions use walker/cane & assistance as needed   Disposition SNF   Diet: Heart Healthy , with feeding assistance and aspiration precautions.  For Heart failure patients - Check your Weight same time everyday, if you gain over 2 pounds, or you develop in leg swelling, experience more shortness of breath or chest pain, call your Primary MD immediately. Follow Cardiac Low Salt Diet and 1.5 lit/day fluid restriction.   On your next visit with your primary care physician please Get Medicines reviewed and adjusted.   Please request your Prim.MD to go over all Hospital Tests and Procedure/Radiological results at the follow up, please get all Hospital records sent to your Prim MD by signing hospital release before you go home.   If you experience worsening of your admission symptoms, develop shortness of breath, life threatening emergency, suicidal or homicidal thoughts you must seek medical attention immediately by calling 911 or calling your MD immediately  if symptoms less severe.  You Must read complete instructions/literature along with all the possible adverse reactions/side effects for all the Medicines you take and that have been prescribed to you. Take any new Medicines after you have completely understood and accpet all the possible adverse reactions/side effects.   Do not drive, operating heavy machinery, perform activities at heights, swimming or participation in water activities or provide baby sitting services if your were admitted for syncope or siezures until you have seen by Primary MD or a Neurologist and advised to do so again.  Do not drive when taking Pain medications.    Do not take more than prescribed Pain, Sleep and Anxiety Medications  Special Instructions:  If you have smoked or chewed Tobacco  in the last 2 yrs please stop smoking, stop any regular Alcohol  and or any Recreational drug use.  Wear Seat belts while driving.   Please note  You were cared for by a hospitalist during your hospital stay. If you have any questions about your discharge medications or the care you received while you were in the hospital after you are discharged, you can call the unit and asked to speak with the hospitalist on call if the hospitalist that took care of you is not available. Once you are discharged, your primary care physician will handle any further medical issues. Please note that NO REFILLS for any discharge medications will be authorized once you are discharged, as it is imperative that you return to your primary care physician (or establish a relationship with a primary care physician if you do not have one) for your aftercare needs so that they can reassess your need for medications and monitor your lab values.

## 2016-06-29 NOTE — Discharge Summary (Signed)
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 visualized subclavian arteries widely patent. Right common carotid artery widely patent from its origin to the bifurcation. Mild atheromatous irregularity about the right bifurcation/proximal right ICA with associated mild stenosis of the proximal right ICA. Right ICA patent distally to the skullbase without stenosis or occlusion. Left common carotid artery patent from its origin to the bifurcation. Minimal atheromatous irregularity about the left bifurcation without significant narrowing. Left ICA patent distally to the skullbase without stenosis or occlusion. Both of the vertebral arteries arise from the subclavian arteries. Right vertebral artery slightly dominant. Vertebral arteries are tortuous proximally. Vertebral arteries patent within the neck without significant stenosis or occlusion. No obvious evidence for dissection. Atheromatous irregularity with tortuosity noted within the distal left V2 segment. IMPRESSION: MRI HEAD IMPRESSION: 1. Acute  moderate sized left PCA territory infarct. Associated petechial hemorrhage without significant mass effect. 2. Remote right parietal and left cerebellar infarcts as above. 3. Generalized age-related cerebral atrophy with moderate chronic microvascular ischemic disease. MRA HEAD IMPRESSION: 1. Attenuation of the distal left PCA, likely occluded given the acute left PCA territory infarct (left P3 segment). 2. Otherwise widely patent anterior and posterior circulation. No high-grade or correctable stenosis. 3. 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 Additional 2 mm focal outpouching arising at the takeoff of the left P com may reflect a small aneurysm or possibly vascular infundibulum. MRA NECK IMPRESSION: 1. Short-segment mild atheromatous narrowing at the proximal right ICA. Otherwise widely patent right carotid artery system. 2. No significant atheromatous disease or luminal narrowing within the left carotid artery system. 3. Widely patent vertebral arteries within the neck. Electronically Signed   By: Jeannine Boga M.D.   On: 06/26/2016 23:43   Mr Jodene Nam Head/brain [id number] Cm  Result Date: 06/26/2016 CLINICAL DATA:  Initial evaluation for acute stroke. EXAM: MRI HEAD WITHOUT CONTRAST MRA HEAD WITHOUT CONTRAST MRA NECK WITHOUT AND  WITH CONTRAST TECHNIQUE: Multiplanar, multiecho pulse sequences of the brain and surrounding structures were obtained without intravenous contrast. Angiographic images of the Circle of Willis were obtained using MRA technique without intravenous contrast. Angiographic images of the neck were obtained using MRA technique without and with intravenous contrast. Carotid stenosis measurements (when applicable) are obtained utilizing NASCET criteria, using the distal internal carotid diameter as the denominator. CONTRAST:  73mL MULTIHANCE GADOBENATE DIMEGLUMINE 529 MG/ML IV SOLN COMPARISON:   Comparison made with prior CT from earlier the same day. FINDINGS: MRI HEAD FINDINGS Generalized age-related cerebral atrophy present. Patchy and confluent T2/FLAIR hyperintensity within the periventricular deep white matter both cerebral hemispheres, most consistent with chronic microvascular ischemic disease. Chronic microvascular ischemic changes present within the pons as well. Encephalomalacia within the right parietal lobe compatible with remote ischemic infarct. Additional small remote infarct noted within the parasagittal left cerebellar hemisphere. Confluent restricted diffusion within the parasagittal left occipital lobe, compatible with acute left PCA territory infarct. No significant mass effect. Associated petechial hemorrhage within the peripheral aspect of this infarct without frank hemorrhagic transformation. No other evidence for acute or subacute ischemia. Gray-white matter differentiation otherwise maintained. No mass lesion, midline shift or mass effect. No hydrocephalus. No extra-axial fluid collection. Major dural sinuses are grossly patent. Pituitary gland and suprasellar region within normal limits. Major intracranial vascular flow voids are maintained. Craniocervical junction within normal limits. Visualized upper cervical spine unremarkable. Bone marrow signal intensity within normal limits. No scalp soft tissue abnormality. Globes and orbital soft tissues within normal limits. Patient is status post lens extraction bilaterally. Scattered mucosal thickening within the ethmoidal air cells. Retention cyst noted within the left maxillary sinus. Paranasal sinuses are otherwise clear. No mastoid effusion. Inner ear structures grossly normal. MRA HEAD FINDINGS ANTERIOR CIRCULATION: Distal cervical segments of the internal carotid arteries are patent with antegrade flow. Petrous, cavernous, and supraclinoid segments widely patent bilaterally without flow-limiting stenosis. 5 mm saccular aneurysm  arises from the cavernous/ supraclinoid left ICA (series 5, image 98). Additional tiny 2 mm focal outpouching arising at the takeoff of the left P com may reflect an additional small aneurysm or vascular infundibulum (series 5, image 85). A1 segments patent. Probable additional 2 mm aneurysm arising from the anterior communicating artery the the (series 5, image 71). Anterior communicating arteries widely patent distally. M1 segments patent without stenosis or occlusion. Left MCA bifurcates early. Distal MCA branches well opacified and symmetric. POSTERIOR CIRCULATION: Vertebral arteries patent to the vertebrobasilar junction without significant stenosis. Right vertebral artery is dominant, with slightly diminutive left vertebral artery. No findings to suggest dissection. Posterior inferior cerebral arteries patent proximally. Basilar artery widely patent. Superior cerebral arteries patent bilaterally. Hypoplastic right P1 segment with prominent widely patent right posterior communicating artery. Left PCA largely supplied via the basilar artery. Right PCA widely patent to its distal aspect. Left PCA is attenuated distally, likely occluded given the left PCA territory infarct. MRA NECK FINDINGS Visualized aortic arch of normal caliber with normal 3 vessel morphology. No high-grade stenosis seen at the origin of the great vessels. Partially visualized subclavian arteries widely patent. Right common carotid artery widely patent from its origin to the bifurcation. Mild atheromatous irregularity about the right bifurcation/proximal right ICA with associated mild stenosis of the proximal right ICA. Right ICA patent distally to the skullbase without stenosis or occlusion. Left common carotid artery patent from its origin to the bifurcation. Minimal atheromatous irregularity about the left bifurcation without significant narrowing. Left ICA patent distally to the  skullbase without stenosis or occlusion. Both of the  vertebral arteries arise from the subclavian arteries. Right vertebral artery slightly dominant. Vertebral arteries are tortuous proximally. Vertebral arteries patent within the neck without significant stenosis or occlusion. No obvious evidence for dissection. Atheromatous irregularity with tortuosity noted within the distal left V2 segment. IMPRESSION: MRI HEAD IMPRESSION: 1. Acute moderate sized left PCA territory infarct. Associated petechial hemorrhage without significant mass effect. 2. Remote right parietal and left cerebellar infarcts as above. 3. Generalized age-related cerebral atrophy with moderate chronic microvascular ischemic disease. MRA HEAD IMPRESSION: 1. Attenuation of the distal left PCA, likely occluded given the acute left PCA territory infarct (left P3 segment). 2. Otherwise widely patent anterior and posterior circulation. No high-grade or correctable stenosis. 3. 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 Additional 2 mm focal outpouching arising at the takeoff of the left P com may reflect a small aneurysm or possibly vascular infundibulum. MRA NECK IMPRESSION: 1. Short-segment mild atheromatous narrowing at the proximal right ICA. Otherwise widely patent right carotid artery system. 2. No significant atheromatous disease or luminal narrowing within the left carotid artery system. 3. Widely patent vertebral arteries within the neck. Electronically Signed   By: Jeannine Boga M.D.   On: 06/26/2016 23:43    Micro Results   Recent Results (from the past 240 hour(s))  Culture, Urine     Status: Abnormal   Collection Time: 06/27/16  1:07 AM  Result Value Ref Range Status   Specimen Description URINE, RANDOM  Final   Special Requests NONE  Final   Culture >=100,000 COLONIES/mL ESCHERICHIA COLI (A)  Final   Report Status 06/29/2016 FINAL  Final   Organism ID, Bacteria ESCHERICHIA COLI (A)   Final      Susceptibility   Escherichia coli - MIC*    AMPICILLIN <=2 SENSITIVE Sensitive     CEFAZOLIN <=4 SENSITIVE Sensitive     CEFTRIAXONE <=1 SENSITIVE Sensitive     CIPROFLOXACIN <=0.25 SENSITIVE Sensitive     GENTAMICIN <=1 SENSITIVE Sensitive     IMIPENEM 0.5 SENSITIVE Sensitive     NITROFURANTOIN <=16 SENSITIVE Sensitive     TRIMETH/SULFA <=20 SENSITIVE Sensitive     AMPICILLIN/SULBACTAM <=2 SENSITIVE Sensitive     PIP/TAZO <=4 SENSITIVE Sensitive     Extended ESBL NEGATIVE Sensitive     * >=100,000 COLONIES/mL ESCHERICHIA COLI       Today   Subjective:   Samantha Bishop today has no headache,no chest or abdominal pain, feels much better  today.   Objective:   Blood pressure (!) 144/72, pulse 85, temperature 98 F (36.7 C), temperature source Oral, resp. rate 19, height 5\' 6"  (1.676 m), weight 84.4 kg (186 lb 1.1 oz), SpO2 98 %.  No intake or output data in the 24 hours ending 06/29/16 1151  Exam  Awake Alert,Conversant , She appears to be in a better mood today Supple Neck,No JVD, left hemianopia Symmetrical Chest wall movement, Good air movement bilaterally, CTAB irr irr,No Gallops,Rubs or new Murmurs, No Parasternal Heave +ve B.Sounds, Abd Soft, No tenderness,  No rebound - guarding or rigidity. No Cyanosis, Clubbing or edema, No new Rash or bruise Data Review   CBC w Diff:  Lab Results  Component Value Date   WBC 6.6 06/27/2016   HGB 12.0 06/27/2016   HCT 37.2 06/27/2016   PLT 228 06/27/2016   LYMPHOPCT 29 03/21/2015   MONOPCT 9 03/21/2015   EOSPCT  1 03/21/2015   BASOPCT 0 03/21/2015    CMP:  Lab Results  Component Value Date   NA 135 06/27/2016   K 3.3 (L) 06/27/2016   CL 102 06/27/2016   CO2 25 06/27/2016   BUN 8 06/27/2016   CREATININE 0.68 06/27/2016   PROT 7.5 06/26/2016   ALBUMIN 4.3 06/26/2016   BILITOT 0.9 06/26/2016   ALKPHOS 46 06/26/2016   AST 23 06/26/2016   ALT 20 06/26/2016  .   Total Time in preparing paper work,  data evaluation and todays exam - 35 minutes  Virat Prather M.D on 06/29/2016 at 11:51 AM  [facility]   Office  [phone]

## 2016-06-29 NOTE — NC FL2 (Signed)
[place] LEVEL OF CARE SCREENING TOOL     IDENTIFICATION  Patient Name: Samantha Bishop Birthdate: [birth year] Sex: female Admission Date (Current Location): 06/26/2016  [facility] and [place] Number:  Herbalist and Address:  [facility] [place], [place] [address], [place], [place] [address]      Provider Number: [id number]  Attending Physician Name and Address:  Albertine Patricia, MD  Relative Name and Phone Number:       Current Level of Care: Hospital Recommended Level of Care: [place] Prior Approval Number:    Date Approved/Denied:   PASRR Number: [id number]  Discharge Plan: SNF    Current Diagnoses: Patient Active Problem List   Diagnosis Date Noted  . Adjustment disorder with mixed anxiety and depressed mood 06/28/2016  . CVA (cerebral vascular accident) ([place]) 06/26/2016  . Right homonymous hemianopsia 06/26/2016  . Back pain 04/08/2015  . Abnormal CT scan 04/08/2015  . Constipation 01/02/2015  . Intracranial aneurysm 10/18/2014  . Routine general medical examination at a health care facility 03/08/2014  . Occipital neuralgia 07/16/2013  . Venous insufficiency 07/11/2013  . Alopecia 06/08/2012  . Osteopenia 02/21/2010  . ANXIETY 05/21/2009  . Essential hypertension 05/21/2009  . Atrial fibrillation - followed by Dr. Caryl Comes 03/03/2009  . INTRACRANIAL ANEURYSM 08/16/2007  . Hyperlipidemia 08/15/2007  . GERD 08/15/2007    Orientation RESPIRATION BLADDER Height & Weight     Self, Situation, Place  Normal Continent Weight: 186 lb 1.1 oz (84.4 kg) Height:  5\' 6"  (167.6 cm)  BEHAVIORAL SYMPTOMS/MOOD NEUROLOGICAL BOWEL NUTRITION STATUS      Continent Diet (regular)  AMBULATORY STATUS COMMUNICATION OF NEEDS Skin   Supervision Verbally Normal                       Personal Care Assistance Level of Assistance  Bathing, Feeding, Dressing Bathing Assistance: Limited assistance Feeding assistance:  Independent Dressing Assistance: Limited assistance     Functional Limitations Info  Sight, Hearing, Speech Sight Info: Adequate Hearing Info: Impaired Speech Info: Adequate    SPECIAL CARE FACTORS FREQUENCY  PT (By licensed PT), OT (By licensed OT), Speech therapy     PT Frequency: 5x OT Frequency: 5x     Speech Therapy Frequency: 5x      Contractures Contractures Info: Not present    Additional Factors Info  Code Status, Allergies Code Status Info: Full Code Allergies Info: Codeine, Penicillins           Current Medications (06/29/2016):  This is the current hospital active medication list Current Facility-Administered Medications  Medication Dose Route Frequency Provider Last Rate Last Dose  . acetaminophen (TYLENOL) tablet 650 mg  650 mg Oral Q4H PRN Norval Morton, MD       Or  . acetaminophen (TYLENOL) solution 650 mg  650 mg Per Tube Q4H PRN Norval Morton, MD       Or  . acetaminophen (TYLENOL) suppository 650 mg  650 mg Rectal Q4H PRN Norval Morton, MD      . apixaban (ELIQUIS) tablet 5 mg  5 mg Oral BID Albertine Patricia, MD   5 mg at 06/29/16 0924  . ciprofloxacin (CIPRO) tablet 500 mg  500 mg Oral QHS Albertine Patricia, MD   500 mg at 06/28/16 2109  . cycloSPORINE (RESTASIS) 0.05 % ophthalmic emulsion 1 drop  1 drop Both Eyes Daily PRN Norval Morton, MD      .  gabapentin (NEURONTIN) capsule 300 mg  300 mg Oral Daily Norval Morton, MD   300 mg at 06/29/16 [id number]   And  . gabapentin (NEURONTIN) capsule 400 mg  400 mg Oral QHS Norval Morton, MD   400 mg at 06/28/16 2109  . HYDROcodone-acetaminophen (NORCO/VICODIN) 5-325 MG per tablet 1 tablet  1 tablet Oral Q4H PRN Rondell A Tamala Julian, MD      . polyethylene glycol (MIRALAX / GLYCOLAX) packet 17 g  17 g Oral Daily PRN Albertine Patricia, MD   17 g at 06/27/16 1824  . pravastatin (PRAVACHOL) tablet 20 mg  20 mg Oral q1800 David L Rinehuls, PA-C   20 mg at 06/28/16 1730     Discharge  Medications: Please see discharge summary for a list of discharge medications.  Relevant Imaging Results:  Relevant Lab Results:   Additional Information SSN:  [id number]  Wende Neighbors, LCSW

## 2016-06-29 NOTE — Care Management Note (Signed)
Case Management Note  Patient Details  Name: Samantha Bishop MRN: [id number] Date of Birth: 1925-03-19  Subjective/Objective:                    Action/Plan: Pt discharging to [facility] SNF today. No further needs per CM.   Expected Discharge Date:  06/29/16               Expected Discharge Plan:  Skilled Nursing Facility  In-House Referral:  Clinical Social Work  Discharge planning Services  CM Consult  Post Acute Care Choice:    Choice offered to:     DME Arranged:    DME Agency:     HH Arranged:    [place] Agency:     Status of Service:  Completed, signed off  If discussed at [employer], dates discussed:    Additional Comments:  Pollie Friar, RN 06/29/2016, 12:05 PM

## 2016-06-29 NOTE — Progress Notes (Signed)
Pt d/c'd to SNF via [facility] transportation. Pt d/c'd with her belongings, son at bedside at time of discharge. Pt given copy of her d/c instructions, reviewed with her and her son.

## 2016-06-30 DIAGNOSIS — I4891 Unspecified atrial fibrillation: Secondary | ICD-10-CM | POA: Diagnosis not present

## 2016-06-30 DIAGNOSIS — I69398 Other sequelae of cerebral infarction: Secondary | ICD-10-CM | POA: Diagnosis not present

## 2016-06-30 DIAGNOSIS — I10 Essential (primary) hypertension: Secondary | ICD-10-CM | POA: Diagnosis not present

## 2016-07-02 ENCOUNTER — Telehealth: Payer: Self-pay

## 2016-07-02 NOTE — Telephone Encounter (Signed)
Pt is on TCM list. Called number listed but no answer.   Pt was dc'ed on 06/29/2016 after Cerebral vascular accident.

## 2016-07-15 ENCOUNTER — Ambulatory Visit (INDEPENDENT_AMBULATORY_CARE_PROVIDER_SITE_OTHER): Payer: Medicare Other | Admitting: Nurse Practitioner

## 2016-07-15 ENCOUNTER — Encounter: Payer: Self-pay | Admitting: Nurse Practitioner

## 2016-07-15 ENCOUNTER — Other Ambulatory Visit (INDEPENDENT_AMBULATORY_CARE_PROVIDER_SITE_OTHER): Payer: Medicare Other

## 2016-07-15 VITALS — BP 146/82 | HR 90 | Temp 98.2°F | Ht 65.5 in | Wt 193.0 lb

## 2016-07-15 DIAGNOSIS — N3000 Acute cystitis without hematuria: Secondary | ICD-10-CM

## 2016-07-15 DIAGNOSIS — I63332 Cerebral infarction due to thrombosis of left posterior cerebral artery: Secondary | ICD-10-CM

## 2016-07-15 DIAGNOSIS — I10 Essential (primary) hypertension: Secondary | ICD-10-CM | POA: Diagnosis not present

## 2016-07-15 LAB — CBC
HCT: 38.4 % (ref 36.0–46.0)
Hemoglobin: 12.6 g/dL (ref 12.0–15.0)
MCHC: 32.9 g/dL (ref 30.0–36.0)
MCV: 89.9 fl (ref 78.0–100.0)
Platelets: 234 10*3/uL (ref 150.0–400.0)
RBC: 4.27 Mil/uL (ref 3.87–5.11)
RDW: 13.9 % (ref 11.5–15.5)
WBC: 7.3 10*3/uL (ref 4.0–10.5)

## 2016-07-15 LAB — POCT URINALYSIS DIPSTICK
Bilirubin, UA: NEGATIVE
Glucose, UA: NEGATIVE
Ketones, UA: NEGATIVE
Leukocytes, UA: NEGATIVE
Nitrite, UA: NEGATIVE
Spec Grav, UA: 1.02
Urobilinogen, UA: 0.2
pH, UA: 6

## 2016-07-15 LAB — BASIC METABOLIC PANEL
BUN: 13 mg/dL (ref 6–23)
CO2: 30 mEq/L (ref 19–32)
Calcium: 9.9 mg/dL (ref 8.4–10.5)
Chloride: 102 mEq/L (ref 96–112)
Creatinine, Ser: 0.63 mg/dL (ref 0.40–1.20)
GFR: 94.51 mL/min (ref >60.00)
Glucose, Bld: 102 mg/dL — ABNORMAL HIGH (ref 70–99)
Potassium: 3.6 mEq/L (ref 3.5–5.1)
Sodium: 138 mEq/L (ref 135–145)

## 2016-07-15 NOTE — Progress Notes (Signed)
Pre visit review using our clinic review tool, if applicable. No additional management support is needed unless otherwise documented below in the visit note. 

## 2016-07-15 NOTE — Progress Notes (Signed)
Subjective:  Patient ID: Samantha Bishop, female    DOB: [birth year]  Age: 83 y.o. MRN: [id number]  CC: Hospitalization Follow-up (went to hospital 06/24/2016---stroke. --urine white 2 days ago)   HPI TCM call not completed.  Admitted 06/26/2016 Discharge to skilled nursing 06/29/16.  Samantha Bishop presented in ER with headache, altered right visual field and dizziness x 2days. Head CT and MRI revealed left PCA infarct-embolic secondary to A-fib, and multiple aneurysms, and UA revealed UTI. Echo: EF 60-65% with no emboli. discharged to SNF, and counseled to no longer drive due to visual deficits.  Left PCA Infarct with visual deficit: She is to follow up with neurologist. Has not made appt yet.  A-Fib: Rate controlled Xarelto was switched to Eliquis, CHADvasc score of 7. She is to follow up with Dr. Caryl Comes (cardiologist). She denies any signs of bleeding or palpitations today.  UTI: Secondary to E.coli. Treated with cipro. She denies any GI/GU symptoms today Has to make appt woth neurologist and opthalmologist. has applied for meals of wheels, waiting to be approved.  In office with son today. In nursing home for 10days, discharged home. She lives alone. Home PT to start tomorrow. She has applied for meals on wheels, but services has not began. Has no loose rugs at home, has no grab bars in her bathroom  Outpatient Medications Prior to Visit  Medication Sig Dispense Refill  . apixaban (ELIQUIS) 5 MG TABS tablet Take 1 tablet (5 mg total) by mouth 2 (two) times daily. 60 tablet   . gabapentin (NEURONTIN) 100 MG capsule TAKE 3 CAPSULES BY MOUTH IN THE MORNING AND 4 CAPSULES AT NIGHT  3  . polyethylene glycol (MIRALAX / GLYCOLAX) packet Take 17 g by mouth daily as needed for moderate constipation. 14 each 0  . pravastatin (PRAVACHOL) 20 MG tablet Take 1 tablet (20 mg total) by mouth daily at 6 PM.    . RESTASIS 0.05 % ophthalmic emulsion Place 1 drop into both eyes daily as  needed for dry eyes.    Marland Kitchen sennosides-docusate sodium (SENOKOT-S) 8.6-50 MG tablet Take 1 tablet by mouth 2 (two) times daily. 180 tablet 3  . hydrochlorothiazide (HYDRODIURIL) 25 MG tablet Take 0.5 tablets (12.5 mg total) by mouth daily. (Patient not taking: Reported on 07/15/2016) 45 tablet 0  . HYDROcodone-acetaminophen (NORCO) 5-325 MG tablet Take 1 tablet by mouth every 4 (four) hours as needed for moderate pain. (Patient not taking: Reported on 07/15/2016) 10 tablet 0   No facility-administered medications prior to visit.     ROS See HPI  Objective:  BP (!) 146/82   Pulse 90   Temp 98.2 F (36.8 C)   Ht 5' 5.5" (1.664 m)   Wt 193 lb (87.5 kg)   SpO2 94%   BMI 31.63 kg/m   BP Readings from Last 3 Encounters:  07/15/16 (!) 146/82  06/29/16 135/67  06/26/16 170/98    Wt Readings from Last 3 Encounters:  07/15/16 193 lb (87.5 kg)  06/26/16 186 lb 1.1 oz (84.4 kg)  10/29/15 186 lb 3.2 oz (84.5 kg)    Physical Exam  Constitutional: She is oriented to person, place, and time. No distress.  Cardiovascular: Normal rate and normal heart sounds.   irregular  Pulmonary/Chest: Effort normal and breath sounds normal.  Abdominal: Soft. Bowel sounds are normal.  Musculoskeletal: She exhibits edema. She exhibits no tenderness.  Neurological: She is alert and oriented to person, place, and time.  Ambulates with cane  Skin: Skin  is warm and dry.  Vitals reviewed.   Lab Results  Component Value Date   WBC 7.3 07/15/2016   HGB 12.6 07/15/2016   HCT 38.4 07/15/2016   PLT 234.0 07/15/2016   GLUCOSE 102 (H) 07/15/2016   CHOL 157 06/27/2016   TRIG 85 06/27/2016   HDL 50 06/27/2016   LDLCALC 90 06/27/2016   ALT 20 06/26/2016   AST 23 06/26/2016   NA 138 07/15/2016   K 3.6 07/15/2016   CL 102 07/15/2016   CREATININE 0.63 07/15/2016   BUN 13 07/15/2016   CO2 30 07/15/2016   TSH 1.99 10/02/2014   INR 1.44 06/26/2016   HGBA1C 6.3 (H) 06/27/2016    Dg Chest 2 View  Result  Date: 06/26/2016 CLINICAL DATA:  Stroke today. EXAM: CHEST  2 VIEW COMPARISON:  CT chest 03/21/2015.  Chest 03/20/2015 FINDINGS: Shallow inspiration. Mild cardiac enlargement. No pulmonary vascular congestion or edema. No blunting of costophrenic angles. No pneumothorax. Mediastinal contours appear intact. Calcified and tortuous aorta. Degenerative changes in the spine and shoulders. IMPRESSION: Cardiac enlargement. No evidence of active pulmonary disease. Aortic atherosclerosis. Electronically Signed   By: Lucienne Capers M.D.   On: 06/26/2016 22:40   Ct Head Wo Contrast  Result Date: 06/26/2016 CLINICAL DATA:  Visual field deficit.  Confusion. EXAM: CT HEAD WITHOUT CONTRAST TECHNIQUE: Contiguous axial images were obtained from the base of the skull through the vertex without intravenous contrast. COMPARISON:  07/20/2013 FINDINGS: Brain: Acute to subacute ischemia identified medial left occipital lobe, consistent with PCA territory infarct on the left. Old right parieto-occipital infarct stable in appearance. Diffuse loss of parenchymal volume is consistent with atrophy. Patchy low attenuation in the deep hemispheric and periventricular white matter is nonspecific, but likely reflects chronic microvascular ischemic demyelination. Apparent lacunar infarct in the left aspect of the pons is stable. Vascular: Atherosclerotic calcification is visualized in the carotid arteries. No dense MCA sign. Major dural sinuses are unremarkable. Skull: Bone windows reveal no worrisome lytic or sclerotic osseous lesions. Sinuses/Orbits: Polypoid chronic mucosal disease left maxillary sinus. Remaining visualized paranasal sinuses and mastoid air cells are clear. Visualized portions of the globes and intraorbital fat are unremarkable. Other: None. IMPRESSION: 1. Acute to subacute left PCA territory infarct. MRI may prove helpful to assess acuity, as clinically warranted. No evidence for associated hemorrhage. 2. Stable appearance  of old right parieto-occipital lobe infarct. 3. Chronic small vessel white matter ischemic disease. Critical Value/emergent results were called by me at the time of interpretation on 06/26/2016 at 6:10 pm to Dr. Charlena Cross , who verbally acknowledged these results. Electronically Signed   By: Misty Stanley M.D.   On: 06/26/2016 18:10   Mr Jodene Nam Neck W Wo Contrast  Result Date: 06/26/2016 CLINICAL DATA:  Initial evaluation for acute stroke. EXAM: MRI HEAD WITHOUT CONTRAST MRA HEAD WITHOUT CONTRAST MRA NECK WITHOUT AND WITH CONTRAST TECHNIQUE: Multiplanar, multiecho pulse sequences of the brain and surrounding structures were obtained without intravenous contrast. Angiographic images of the Circle of Willis were obtained using MRA technique without intravenous contrast. Angiographic images of the neck were obtained using MRA technique without and with intravenous contrast. Carotid stenosis measurements (when applicable) are obtained utilizing NASCET criteria, using the distal internal carotid diameter as the denominator. CONTRAST:  30mL MULTIHANCE GADOBENATE DIMEGLUMINE 529 MG/ML IV SOLN COMPARISON:  Comparison made with prior CT from earlier the same day. FINDINGS: MRI HEAD FINDINGS Generalized age-related cerebral atrophy present. Patchy and confluent T2/FLAIR hyperintensity within the periventricular deep white  matter both cerebral hemispheres, most consistent with chronic microvascular ischemic disease. Chronic microvascular ischemic changes present within the pons as well. Encephalomalacia within the right parietal lobe compatible with remote ischemic infarct. Additional small remote infarct noted within the parasagittal left cerebellar hemisphere. Confluent restricted diffusion within the parasagittal left occipital lobe, compatible with acute left PCA territory infarct. No significant mass effect. Associated petechial hemorrhage within the peripheral aspect of this infarct without frank hemorrhagic  transformation. No other evidence for acute or subacute ischemia. Gray-white matter differentiation otherwise maintained. No mass lesion, midline shift or mass effect. No hydrocephalus. No extra-axial fluid collection. Major dural sinuses are grossly patent. Pituitary gland and suprasellar region within normal limits. Major intracranial vascular flow voids are maintained. Craniocervical junction within normal limits. Visualized upper cervical spine unremarkable. Bone marrow signal intensity within normal limits. No scalp soft tissue abnormality. Globes and orbital soft tissues within normal limits. Patient is status post lens extraction bilaterally. Scattered mucosal thickening within the ethmoidal air cells. Retention cyst noted within the left maxillary sinus. Paranasal sinuses are otherwise clear. No mastoid effusion. Inner ear structures grossly normal. MRA HEAD FINDINGS ANTERIOR CIRCULATION: Distal cervical segments of the internal carotid arteries are patent with antegrade flow. Petrous, cavernous, and supraclinoid segments widely patent bilaterally without flow-limiting stenosis. 5 mm saccular aneurysm arises from the cavernous/ supraclinoid left ICA (series 5, image 98). Additional tiny 2 mm focal outpouching arising at the takeoff of the left P com may reflect an additional small aneurysm or vascular infundibulum (series 5, image 85). A1 segments patent. Probable additional 2 mm aneurysm arising from the anterior communicating artery the the (series 5, image 71). Anterior communicating arteries widely patent distally. M1 segments patent without stenosis or occlusion. Left MCA bifurcates early. Distal MCA branches well opacified and symmetric. POSTERIOR CIRCULATION: Vertebral arteries patent to the vertebrobasilar junction without significant stenosis. Right vertebral artery is dominant, with slightly diminutive left vertebral artery. No findings to suggest dissection. Posterior inferior cerebral arteries  patent proximally. Basilar artery widely patent. Superior cerebral arteries patent bilaterally. Hypoplastic right P1 segment with prominent widely patent right posterior communicating artery. Left PCA largely supplied via the basilar artery. Right PCA widely patent to its distal aspect. Left PCA is attenuated distally, likely occluded given the left PCA territory infarct. MRA NECK FINDINGS Visualized aortic arch of normal caliber with normal 3 vessel morphology. No high-grade stenosis seen at the origin of the great vessels. Partially visualized subclavian arteries widely patent. Right common carotid artery widely patent from its origin to the bifurcation. Mild atheromatous irregularity about the right bifurcation/proximal right ICA with associated mild stenosis of the proximal right ICA. Right ICA patent distally to the skullbase without stenosis or occlusion. Left common carotid artery patent from its origin to the bifurcation. Minimal atheromatous irregularity about the left bifurcation without significant narrowing. Left ICA patent distally to the skullbase without stenosis or occlusion. Both of the vertebral arteries arise from the subclavian arteries. Right vertebral artery slightly dominant. Vertebral arteries are tortuous proximally. Vertebral arteries patent within the neck without significant stenosis or occlusion. No obvious evidence for dissection. Atheromatous irregularity with tortuosity noted within the distal left V2 segment. IMPRESSION: MRI HEAD IMPRESSION: 1. Acute moderate sized left PCA territory infarct. Associated petechial hemorrhage without significant mass effect. 2. Remote right parietal and left cerebellar infarcts as above. 3. Generalized age-related cerebral atrophy with moderate chronic microvascular ischemic disease. MRA HEAD IMPRESSION: 1. Attenuation of the distal left PCA, likely occluded given the acute left  PCA territory infarct (left P3 segment). 2. Otherwise widely patent  anterior and posterior circulation. No high-grade or correctable stenosis. 3. 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 Additional 2 mm focal outpouching arising at the takeoff of the left P com may reflect a small aneurysm or possibly vascular infundibulum. MRA NECK IMPRESSION: 1. Short-segment mild atheromatous narrowing at the proximal right ICA. Otherwise widely patent right carotid artery system. 2. No significant atheromatous disease or luminal narrowing within the left carotid artery system. 3. Widely patent vertebral arteries within the neck. Electronically Signed   By: Jeannine Boga M.D.   On: 06/26/2016 23:43   Mr Brain Wo Contrast  Result Date: 06/26/2016 CLINICAL DATA:  Initial evaluation for acute stroke. EXAM: MRI HEAD WITHOUT CONTRAST MRA HEAD WITHOUT CONTRAST MRA NECK WITHOUT AND WITH CONTRAST TECHNIQUE: Multiplanar, multiecho pulse sequences of the brain and surrounding structures were obtained without intravenous contrast. Angiographic images of the Circle of Willis were obtained using MRA technique without intravenous contrast. Angiographic images of the neck were obtained using MRA technique without and with intravenous contrast. Carotid stenosis measurements (when applicable) are obtained utilizing NASCET criteria, using the distal internal carotid diameter as the denominator. CONTRAST:  4mL MULTIHANCE GADOBENATE DIMEGLUMINE 529 MG/ML IV SOLN COMPARISON:  Comparison made with prior CT from earlier the same day. FINDINGS: MRI HEAD FINDINGS Generalized age-related cerebral atrophy present. Patchy and confluent T2/FLAIR hyperintensity within the periventricular deep white matter both cerebral hemispheres, most consistent with chronic microvascular ischemic disease. Chronic microvascular ischemic changes present within the pons as well. Encephalomalacia within the right parietal lobe compatible  with remote ischemic infarct. Additional small remote infarct noted within the parasagittal left cerebellar hemisphere. Confluent restricted diffusion within the parasagittal left occipital lobe, compatible with acute left PCA territory infarct. No significant mass effect. Associated petechial hemorrhage within the peripheral aspect of this infarct without frank hemorrhagic transformation. No other evidence for acute or subacute ischemia. Gray-white matter differentiation otherwise maintained. No mass lesion, midline shift or mass effect. No hydrocephalus. No extra-axial fluid collection. Major dural sinuses are grossly patent. Pituitary gland and suprasellar region within normal limits. Major intracranial vascular flow voids are maintained. Craniocervical junction within normal limits. Visualized upper cervical spine unremarkable. Bone marrow signal intensity within normal limits. No scalp soft tissue abnormality. Globes and orbital soft tissues within normal limits. Patient is status post lens extraction bilaterally. Scattered mucosal thickening within the ethmoidal air cells. Retention cyst noted within the left maxillary sinus. Paranasal sinuses are otherwise clear. No mastoid effusion. Inner ear structures grossly normal. MRA HEAD FINDINGS ANTERIOR CIRCULATION: Distal cervical segments of the internal carotid arteries are patent with antegrade flow. Petrous, cavernous, and supraclinoid segments widely patent bilaterally without flow-limiting stenosis. 5 mm saccular aneurysm arises from the cavernous/ supraclinoid left ICA (series 5, image 98). Additional tiny 2 mm focal outpouching arising at the takeoff of the left P com may reflect an additional small aneurysm or vascular infundibulum (series 5, image 85). A1 segments patent. Probable additional 2 mm aneurysm arising from the anterior communicating artery the the (series 5, image 71). Anterior communicating arteries widely patent distally. M1 segments patent  without stenosis or occlusion. Left MCA bifurcates early. Distal MCA branches well opacified and symmetric. POSTERIOR CIRCULATION: Vertebral arteries patent to the vertebrobasilar junction without significant stenosis. Right vertebral artery is dominant, with slightly diminutive left vertebral artery. No findings to suggest dissection. Posterior inferior cerebral arteries patent proximally.  Basilar artery widely patent. Superior cerebral arteries patent bilaterally. Hypoplastic right P1 segment with prominent widely patent right posterior communicating artery. Left PCA largely supplied via the basilar artery. Right PCA widely patent to its distal aspect. Left PCA is attenuated distally, likely occluded given the left PCA territory infarct. MRA NECK FINDINGS Visualized aortic arch of normal caliber with normal 3 vessel morphology. No high-grade stenosis seen at the origin of the great vessels. Partially visualized subclavian arteries widely patent. Right common carotid artery widely patent from its origin to the bifurcation. Mild atheromatous irregularity about the right bifurcation/proximal right ICA with associated mild stenosis of the proximal right ICA. Right ICA patent distally to the skullbase without stenosis or occlusion. Left common carotid artery patent from its origin to the bifurcation. Minimal atheromatous irregularity about the left bifurcation without significant narrowing. Left ICA patent distally to the skullbase without stenosis or occlusion. Both of the vertebral arteries arise from the subclavian arteries. Right vertebral artery slightly dominant. Vertebral arteries are tortuous proximally. Vertebral arteries patent within the neck without significant stenosis or occlusion. No obvious evidence for dissection. Atheromatous irregularity with tortuosity noted within the distal left V2 segment. IMPRESSION: MRI HEAD IMPRESSION: 1. Acute moderate sized left PCA territory infarct. Associated petechial  hemorrhage without significant mass effect. 2. Remote right parietal and left cerebellar infarcts as above. 3. Generalized age-related cerebral atrophy with moderate chronic microvascular ischemic disease. MRA HEAD IMPRESSION: 1. Attenuation of the distal left PCA, likely occluded given the acute left PCA territory infarct (left P3 segment). 2. Otherwise widely patent anterior and posterior circulation. No high-grade or correctable stenosis. 3. 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 Additional 2 mm focal outpouching arising at the takeoff of the left P com may reflect a small aneurysm or possibly vascular infundibulum. MRA NECK IMPRESSION: 1. Short-segment mild atheromatous narrowing at the proximal right ICA. Otherwise widely patent right carotid artery system. 2. No significant atheromatous disease or luminal narrowing within the left carotid artery system. 3. Widely patent vertebral arteries within the neck. Electronically Signed   By: Jeannine Boga M.D.   On: 06/26/2016 23:43   Mr Jodene Nam Head/brain GM Cm  Result Date: 06/26/2016 CLINICAL DATA:  Initial evaluation for acute stroke. EXAM: MRI HEAD WITHOUT CONTRAST MRA HEAD WITHOUT CONTRAST MRA NECK WITHOUT AND WITH CONTRAST TECHNIQUE: Multiplanar, multiecho pulse sequences of the brain and surrounding structures were obtained without intravenous contrast. Angiographic images of the Circle of Willis were obtained using MRA technique without intravenous contrast. Angiographic images of the neck were obtained using MRA technique without and with intravenous contrast. Carotid stenosis measurements (when applicable) are obtained utilizing NASCET criteria, using the distal internal carotid diameter as the denominator. CONTRAST:  22mL MULTIHANCE GADOBENATE DIMEGLUMINE 529 MG/ML IV SOLN COMPARISON:  Comparison made with prior CT from earlier the same day. FINDINGS: MRI  HEAD FINDINGS Generalized age-related cerebral atrophy present. Patchy and confluent T2/FLAIR hyperintensity within the periventricular deep white matter both cerebral hemispheres, most consistent with chronic microvascular ischemic disease. Chronic microvascular ischemic changes present within the pons as well. Encephalomalacia within the right parietal lobe compatible with remote ischemic infarct. Additional small remote infarct noted within the parasagittal left cerebellar hemisphere. Confluent restricted diffusion within the parasagittal left occipital lobe, compatible with acute left PCA territory infarct. No significant mass effect. Associated petechial hemorrhage within the peripheral aspect of this infarct without frank hemorrhagic transformation. No other evidence for acute or subacute  ischemia. Gray-white matter differentiation otherwise maintained. No mass lesion, midline shift or mass effect. No hydrocephalus. No extra-axial fluid collection. Major dural sinuses are grossly patent. Pituitary gland and suprasellar region within normal limits. Major intracranial vascular flow voids are maintained. Craniocervical junction within normal limits. Visualized upper cervical spine unremarkable. Bone marrow signal intensity within normal limits. No scalp soft tissue abnormality. Globes and orbital soft tissues within normal limits. Patient is status post lens extraction bilaterally. Scattered mucosal thickening within the ethmoidal air cells. Retention cyst noted within the left maxillary sinus. Paranasal sinuses are otherwise clear. No mastoid effusion. Inner ear structures grossly normal. MRA HEAD FINDINGS ANTERIOR CIRCULATION: Distal cervical segments of the internal carotid arteries are patent with antegrade flow. Petrous, cavernous, and supraclinoid segments widely patent bilaterally without flow-limiting stenosis. 5 mm saccular aneurysm arises from the cavernous/ supraclinoid left ICA (series 5, image 98).  Additional tiny 2 mm focal outpouching arising at the takeoff of the left P com may reflect an additional small aneurysm or vascular infundibulum (series 5, image 85). A1 segments patent. Probable additional 2 mm aneurysm arising from the anterior communicating artery the the (series 5, image 71). Anterior communicating arteries widely patent distally. M1 segments patent without stenosis or occlusion. Left MCA bifurcates early. Distal MCA branches well opacified and symmetric. POSTERIOR CIRCULATION: Vertebral arteries patent to the vertebrobasilar junction without significant stenosis. Right vertebral artery is dominant, with slightly diminutive left vertebral artery. No findings to suggest dissection. Posterior inferior cerebral arteries patent proximally. Basilar artery widely patent. Superior cerebral arteries patent bilaterally. Hypoplastic right P1 segment with prominent widely patent right posterior communicating artery. Left PCA largely supplied via the basilar artery. Right PCA widely patent to its distal aspect. Left PCA is attenuated distally, likely occluded given the left PCA territory infarct. MRA NECK FINDINGS Visualized aortic arch of normal caliber with normal 3 vessel morphology. No high-grade stenosis seen at the origin of the great vessels. Partially visualized subclavian arteries widely patent. Right common carotid artery widely patent from its origin to the bifurcation. Mild atheromatous irregularity about the right bifurcation/proximal right ICA with associated mild stenosis of the proximal right ICA. Right ICA patent distally to the skullbase without stenosis or occlusion. Left common carotid artery patent from its origin to the bifurcation. Minimal atheromatous irregularity about the left bifurcation without significant narrowing. Left ICA patent distally to the skullbase without stenosis or occlusion. Both of the vertebral arteries arise from the subclavian arteries. Right vertebral artery  slightly dominant. Vertebral arteries are tortuous proximally. Vertebral arteries patent within the neck without significant stenosis or occlusion. No obvious evidence for dissection. Atheromatous irregularity with tortuosity noted within the distal left V2 segment. IMPRESSION: MRI HEAD IMPRESSION: 1. Acute moderate sized left PCA territory infarct. Associated petechial hemorrhage without significant mass effect. 2. Remote right parietal and left cerebellar infarcts as above. 3. Generalized age-related cerebral atrophy with moderate chronic microvascular ischemic disease. MRA HEAD IMPRESSION: 1. Attenuation of the distal left PCA, likely occluded given the acute left PCA territory infarct (left P3 segment). 2. Otherwise widely patent anterior and posterior circulation. No high-grade or correctable stenosis. 3. 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 Additional 2 mm focal outpouching arising at the takeoff of the left P com may reflect a small aneurysm or possibly vascular infundibulum. MRA NECK IMPRESSION: 1. Short-segment mild atheromatous narrowing at the proximal right ICA. Otherwise widely patent right carotid artery system. 2. No significant  atheromatous disease or luminal narrowing within the left carotid artery system. 3. Widely patent vertebral arteries within the neck. Electronically Signed   By: Jeannine Boga M.D.   On: 06/26/2016 23:43    Assessment & Plan:   Samantha Bishop was seen today for hospitalization follow-up.  Diagnoses and all orders for this visit:  Cerebrovascular accident (CVA) due to thrombosis of left posterior cerebral artery (HCC)  Essential hypertension -     Basic metabolic panel; Future  Acute cystitis without hematuria -     CBC; Future -     POCT urinalysis dipstick   I am having Samantha Bishop maintain her sennosides-docusate sodium, gabapentin, RESTASIS, hydrochlorothiazide,  HYDROcodone-acetaminophen, apixaban, pravastatin, polyethylene glycol, estradiol, and hydrochlorothiazide.  Meds ordered this encounter  Medications  . estradiol (ESTRACE) 0.1 MG/GM vaginal cream    Sig: Apply 1 g topically 2 (two) times a week.    Refill:  0  . hydrochlorothiazide (HYDRODIURIL) 12.5 MG tablet    Sig: Take 12.5 mg by mouth 1 day or 1 dose.    Follow-up: Return if symptoms worsen or fail to improve.  Wilfred Lacy, NP

## 2016-07-15 NOTE — Patient Instructions (Addendum)
Go to lab for blood draw. You will be called with results.  Maintain upcoming appt with neurologist and opthalmologist.  Continue medications as prescribed.  Start home PT as recommended by neurologist  Advised patient about need for grab bars in bathroom to prevent falls.  Advised to minimize hight sodium meals.  Fall Prevention in the Home Falls can cause injuries. They can happen to people of all ages. There are many things you can do to make your home safe and to help prevent falls. What can I do on the outside of my home?  Regularly fix the edges of walkways and driveways and fix any cracks.  Remove anything that might make you trip as you walk through a door, such as a raised step or threshold.  Trim any bushes or trees on the path to your home.  Use bright outdoor lighting.  Clear any walking paths of anything that might make someone trip, such as rocks or tools.  Regularly check to see if handrails are loose or broken. Make sure that both sides of any steps have handrails.  Any raised decks and porches should have guardrails on the edges.  Have any leaves, snow, or ice cleared regularly.  Use sand or salt on walking paths during winter.  Clean up any spills in your garage right away. This includes oil or grease spills. What can I do in the bathroom?  Use night lights.  Install grab bars by the toilet and in the tub and shower. Do not use towel bars as grab bars.  Use non-skid mats or decals in the tub or shower.  If you need to sit down in the shower, use a plastic, non-slip stool.  Keep the floor dry. Clean up any water that spills on the floor as soon as it happens.  Remove soap buildup in the tub or shower regularly.  Attach bath mats securely with double-sided non-slip rug tape.  Do not have throw rugs and other things on the floor that can make you trip. What can I do in the bedroom?  Use night lights.  Make sure that you have a light by your bed  that is easy to reach.  Do not use any sheets or blankets that are too big for your bed. They should not hang down onto the floor.  Have a firm chair that has side arms. You can use this for support while you get dressed.  Do not have throw rugs and other things on the floor that can make you trip. What can I do in the kitchen?  Clean up any spills right away.  Avoid walking on wet floors.  Keep items that you use a lot in easy-to-reach places.  If you need to reach something above you, use a strong step stool that has a grab bar.  Keep electrical cords out of the way.  Do not use floor polish or wax that makes floors slippery. If you must use wax, use non-skid floor wax.  Do not have throw rugs and other things on the floor that can make you trip. What can I do with my stairs?  Do not leave any items on the stairs.  Make sure that there are handrails on both sides of the stairs and use them. Fix handrails that are broken or loose. Make sure that handrails are as long as the stairways.  Check any carpeting to make sure that it is firmly attached to the stairs. Fix any carpet that is  loose or worn.  Avoid having throw rugs at the top or bottom of the stairs. If you do have throw rugs, attach them to the floor with carpet tape.  Make sure that you have a light switch at the top of the stairs and the bottom of the stairs. If you do not have them, ask someone to add them for you. What else can I do to help prevent falls?  Wear shoes that:  Do not have high heels.  Have rubber bottoms.  Are comfortable and fit you well.  Are closed at the toe. Do not wear sandals.  If you use a stepladder:  Make sure that it is fully opened. Do not climb a closed stepladder.  Make sure that both sides of the stepladder are locked into place.  Ask someone to hold it for you, if possible.  Clearly mark and make sure that you can see:  Any grab bars or handrails.  First and last  steps.  Where the edge of each step is.  Use tools that help you move around (mobility aids) if they are needed. These include:  Canes.  Walkers.  Scooters.  Crutches.  Turn on the lights when you go into a dark area. Replace any light bulbs as soon as they burn out.  Set up your furniture so you have a clear path. Avoid moving your furniture around.  If any of your floors are uneven, fix them.  If there are any pets around you, be aware of where they are.  Review your medicines with your doctor. Some medicines can make you feel dizzy. This can increase your chance of falling. Ask your doctor what other things that you can do to help prevent falls. This information is not intended to replace advice given to you by your health care provider. Make sure you discuss any questions you have with your health care provider. Document Released: 02/20/2009 Document Revised: 10/02/2015 Document Reviewed: 05/31/2014 Elsevier Interactive Patient Education  2017 [employer].

## 2016-07-15 NOTE — Progress Notes (Signed)
Normal results

## 2016-07-22 DIAGNOSIS — I10 Essential (primary) hypertension: Secondary | ICD-10-CM | POA: Diagnosis not present

## 2016-07-22 DIAGNOSIS — M792 Neuralgia and neuritis, unspecified: Secondary | ICD-10-CM | POA: Diagnosis not present

## 2016-07-22 DIAGNOSIS — I480 Paroxysmal atrial fibrillation: Secondary | ICD-10-CM | POA: Diagnosis not present

## 2016-07-22 DIAGNOSIS — I69398 Other sequelae of cerebral infarction: Secondary | ICD-10-CM | POA: Diagnosis not present

## 2016-07-22 DIAGNOSIS — H53461 Homonymous bilateral field defects, right side: Secondary | ICD-10-CM | POA: Diagnosis not present

## 2016-07-22 DIAGNOSIS — M545 Low back pain: Secondary | ICD-10-CM | POA: Diagnosis not present

## 2016-07-28 ENCOUNTER — Telehealth: Payer: Self-pay | Admitting: Internal Medicine

## 2016-07-28 DIAGNOSIS — I480 Paroxysmal atrial fibrillation: Secondary | ICD-10-CM | POA: Diagnosis not present

## 2016-07-28 DIAGNOSIS — M545 Low back pain: Secondary | ICD-10-CM | POA: Diagnosis not present

## 2016-07-28 DIAGNOSIS — I69398 Other sequelae of cerebral infarction: Secondary | ICD-10-CM | POA: Diagnosis not present

## 2016-07-28 DIAGNOSIS — H53461 Homonymous bilateral field defects, right side: Secondary | ICD-10-CM | POA: Diagnosis not present

## 2016-07-28 DIAGNOSIS — M792 Neuralgia and neuritis, unspecified: Secondary | ICD-10-CM | POA: Diagnosis not present

## 2016-07-28 DIAGNOSIS — I10 Essential (primary) hypertension: Secondary | ICD-10-CM | POA: Diagnosis not present

## 2016-07-28 NOTE — Telephone Encounter (Signed)
Ok

## 2016-07-28 NOTE — Telephone Encounter (Signed)
Requesting verbal orders for home health PT once a week for one week and then twice a week for two weeks to address balance and functionality program.

## 2016-07-28 NOTE — Telephone Encounter (Signed)
LVM with debbie with ok

## 2016-07-30 ENCOUNTER — Telehealth: Payer: Self-pay | Admitting: Internal Medicine

## 2016-07-30 DIAGNOSIS — I480 Paroxysmal atrial fibrillation: Secondary | ICD-10-CM | POA: Diagnosis not present

## 2016-07-30 DIAGNOSIS — M545 Low back pain: Secondary | ICD-10-CM | POA: Diagnosis not present

## 2016-07-30 DIAGNOSIS — H53461 Homonymous bilateral field defects, right side: Secondary | ICD-10-CM | POA: Diagnosis not present

## 2016-07-30 DIAGNOSIS — I69398 Other sequelae of cerebral infarction: Secondary | ICD-10-CM | POA: Diagnosis not present

## 2016-07-30 DIAGNOSIS — M792 Neuralgia and neuritis, unspecified: Secondary | ICD-10-CM | POA: Diagnosis not present

## 2016-07-30 DIAGNOSIS — I10 Essential (primary) hypertension: Secondary | ICD-10-CM | POA: Diagnosis not present

## 2016-07-30 MED ORDER — PRAVASTATIN SODIUM 20 MG PO TABS: 20.0000 mg | ORAL_TABLET | Freq: Every day | ORAL | 0 refills | Status: DC

## 2016-07-30 MED ORDER — APIXABAN 5 MG PO TABS: 5.0000 mg | ORAL_TABLET | Freq: Two times a day (BID) | ORAL | 0 refills | Status: DC

## 2016-07-30 NOTE — Telephone Encounter (Signed)
Miralax is available over the counter. Eliquis 1 month sent in, pravastatin 1 month sent in, hctz she should not be out of filled on 07/09/16 for 1 month supply (recommend visit before she runs out), hydrocodone cannot be prescribed outside visit, should not have the estrace cream due to recent stroke without discussion at visit. Her neurologist should be prescribing the gabapentin and if she calls them they may be able to send in 3 month supply.

## 2016-07-30 NOTE — Telephone Encounter (Signed)
Patient aware.

## 2016-07-30 NOTE — Telephone Encounter (Signed)
Pt has not seen Dr. Sharlet Salina since 2016. Per policy no 90 day scripts can be approved until pt see PCP. She saw NP on 3/8, and was told to see Dr. Sharlet Salina in 2 weeks. Pls call pt to make appt, and if she is out of any maintenance meds we can send enough until her appt, but not 90 day...Samantha Bishop

## 2016-07-30 NOTE — Telephone Encounter (Signed)
Cannot give.

## 2016-07-30 NOTE — Telephone Encounter (Signed)
Patient needing maintence meds filled but hasn't been in to see PCP since 2016. Worried about bp med and blood thinner more than anything. Patient is being scheduled for 30 min visit  Patient wanting 90 day supply but stated to her that we probably couldn't do that because its been so long since she's been in

## 2016-07-30 NOTE — Telephone Encounter (Signed)
[name] from [facility] called requesting an order for an [place] for the pts chronic low back pain to give support and help her to be able to walk with less pain.  The order should be sent to [facility], [place], [place] [address] [phone]  Please advise Thanks Gareth Eagle

## 2016-07-30 NOTE — Telephone Encounter (Signed)
LVM with debbie explaining why we cant give order and to call back if she has any questions

## 2016-07-30 NOTE — Telephone Encounter (Signed)
Spoke to pt and scheduled an appointment on Tuesday (08/03/16) with Dr Sharlet Salina. She should have enough medication to get her through until that appointment.

## 2016-07-30 NOTE — Telephone Encounter (Signed)
Pt called requesting refills on all of her prescriptions.  apixaban (ELIQUIS) 5 MG TABS tablet estradiol (ESTRACE) 0.1 MG/GM vaginal cream gabapentin (NEURONTIN) 100 MG capsule hydrochlorothiazide (HYDRODIURIL) 12.5 MG tablet HYDROcodone-acetaminophen (NORCO) 5-325 MG tablet polyethylene glycol (MIRALAX / GLYCOLAX) packet pravastatin (PRAVACHOL) 20 MG tablet -List taken from med list.. Sent for 3 months ($10) to [facility] on [address]. Please advise.

## 2016-07-30 NOTE — Telephone Encounter (Signed)
Patien hasn't been seen by PCP since 2016

## 2016-08-02 DIAGNOSIS — M792 Neuralgia and neuritis, unspecified: Secondary | ICD-10-CM | POA: Diagnosis not present

## 2016-08-02 DIAGNOSIS — H53461 Homonymous bilateral field defects, right side: Secondary | ICD-10-CM | POA: Diagnosis not present

## 2016-08-02 DIAGNOSIS — I480 Paroxysmal atrial fibrillation: Secondary | ICD-10-CM | POA: Diagnosis not present

## 2016-08-02 DIAGNOSIS — M545 Low back pain: Secondary | ICD-10-CM | POA: Diagnosis not present

## 2016-08-02 DIAGNOSIS — I10 Essential (primary) hypertension: Secondary | ICD-10-CM | POA: Diagnosis not present

## 2016-08-02 DIAGNOSIS — I69398 Other sequelae of cerebral infarction: Secondary | ICD-10-CM | POA: Diagnosis not present

## 2016-08-03 ENCOUNTER — Encounter: Payer: Self-pay | Admitting: Internal Medicine

## 2016-08-03 ENCOUNTER — Ambulatory Visit (INDEPENDENT_AMBULATORY_CARE_PROVIDER_SITE_OTHER): Payer: Medicare Other | Admitting: Internal Medicine

## 2016-08-03 DIAGNOSIS — I10 Essential (primary) hypertension: Secondary | ICD-10-CM

## 2016-08-03 DIAGNOSIS — I639 Cerebral infarction, unspecified: Secondary | ICD-10-CM

## 2016-08-03 DIAGNOSIS — E785 Hyperlipidemia, unspecified: Secondary | ICD-10-CM | POA: Diagnosis not present

## 2016-08-03 DIAGNOSIS — H53461 Homonymous bilateral field defects, right side: Secondary | ICD-10-CM

## 2016-08-03 MED ORDER — PRAVASTATIN SODIUM 20 MG PO TABS: 20.0000 mg | ORAL_TABLET | Freq: Every day | ORAL | 3 refills | Status: DC

## 2016-08-03 MED ORDER — HYDROCHLOROTHIAZIDE 25 MG PO TABS: 12.5000 mg | ORAL_TABLET | Freq: Every day | ORAL | 1 refills | Status: DC

## 2016-08-03 MED ORDER — GABAPENTIN 100 MG PO CAPS: ORAL_CAPSULE | ORAL | 3 refills | Status: DC

## 2016-08-03 MED ORDER — APIXABAN 5 MG PO TABS: 5.0000 mg | ORAL_TABLET | Freq: Two times a day (BID) | ORAL | 3 refills | Status: DC

## 2016-08-03 NOTE — Progress Notes (Signed)
Pre visit review using our clinic review tool, if applicable. No additional management support is needed unless otherwise documented below in the visit note. 

## 2016-08-03 NOTE — Progress Notes (Signed)
   Subjective:    Patient ID: Samantha Bishop, female    DOB: 02/11/1925, 83 y.o.   MRN: [id number]  HPI The patient is an 83 YO female coming in for follow up of her long term problems including her blood pressure (changed medications in hospital for recent stroke to 12.5 mg hctz daily, has not been seen by pcp since 2016, denies headaches, chest pains, sob, side effects from medication), and her cholesterol (recently checked in hospital with stroke, taking pravastatin 20 mg daily, no side effects such as myalgia or leg cramps), and her recent stroke (anticoagulation changed from xarelto to eliquis, she has gotten 1 month supply of this from [place] and is livid that she did not get a 3 month supply as it is cheaper for her, she is still suffering from eye changes from the stroke, able to do ADLs, working with PT/OT at home). No new concerns.   Review of Systems  Constitutional: Positive for activity change and fatigue. Negative for appetite change, chills, fever and unexpected weight change.  HENT: Negative.   Eyes: Positive for visual disturbance.  Respiratory: Negative.   Cardiovascular: Negative.   Gastrointestinal: Negative.   Musculoskeletal: Positive for gait problem. Negative for arthralgias, back pain, joint swelling, myalgias and neck pain.  Skin: Negative.   Neurological: Positive for weakness. Negative for dizziness, tremors, seizures, facial asymmetry, speech difficulty and numbness.  Psychiatric/Behavioral: Negative.       Objective:   Physical Exam  Constitutional: She is oriented to person, place, and time. She appears well-developed and well-nourished.  HENT:  Head: Normocephalic and atraumatic.  Eyes: Pupils are equal, round, and reactive to light.  Neck: Normal range of motion. No JVD present.  Cardiovascular: Normal rate and regular rhythm.   Pulmonary/Chest: Effort normal and breath sounds normal.  Abdominal: Soft. She exhibits no distension. There is no tenderness.  There is no rebound.  Musculoskeletal: She exhibits no edema.  Lymphadenopathy:    She has no cervical adenopathy.  Neurological: She is alert and oriented to person, place, and time.  Skin: Skin is warm and dry.  Psychiatric: She has a normal mood and affect.   Vitals:   08/03/16 1320  BP: 130/72  Pulse: 91  Resp: 16  Temp: 98 F (36.7 C)  TempSrc: Oral  SpO2: 98%  Weight: 194 lb (88 kg)  Height: 5' 5.5" (1.664 m)      Assessment & Plan:

## 2016-08-03 NOTE — Assessment & Plan Note (Signed)
BP at goal on HCTZ 12.5 mg daily and refilled at office visit. Recommend visit again in 3-6 months to ensure she is still doing well on medication change.

## 2016-08-03 NOTE — Assessment & Plan Note (Signed)
She is working with PT/OT for vision changes and weakness after recent stroke and we are monitoring BP, lipids, sugars to prevent recurrence if possible. Is taking eliquis.

## 2016-08-03 NOTE — Patient Instructions (Signed)
We have sent in the refills today and given you the paper back for physical therapy. We have also given you a prescription for the back brace.   We would like to see you back in 3-6 months to make sure you are still doing well. If you have any problems or questions come back or call sooner.

## 2016-08-03 NOTE — Assessment & Plan Note (Signed)
Reviewed recent lipid panel and taking pravastatin daily. Okay to continue without indication for change. Goal LDL <100 or <70 ideal.

## 2016-08-04 ENCOUNTER — Telehealth: Payer: Self-pay | Admitting: Internal Medicine

## 2016-08-04 DIAGNOSIS — M545 Low back pain: Secondary | ICD-10-CM | POA: Diagnosis not present

## 2016-08-04 DIAGNOSIS — M792 Neuralgia and neuritis, unspecified: Secondary | ICD-10-CM | POA: Diagnosis not present

## 2016-08-04 DIAGNOSIS — I480 Paroxysmal atrial fibrillation: Secondary | ICD-10-CM | POA: Diagnosis not present

## 2016-08-04 DIAGNOSIS — I69398 Other sequelae of cerebral infarction: Secondary | ICD-10-CM | POA: Diagnosis not present

## 2016-08-04 DIAGNOSIS — I10 Essential (primary) hypertension: Secondary | ICD-10-CM | POA: Diagnosis not present

## 2016-08-04 DIAGNOSIS — H53461 Homonymous bilateral field defects, right side: Secondary | ICD-10-CM | POA: Diagnosis not present

## 2016-08-04 NOTE — Telephone Encounter (Signed)
This was sent in for 90 day supply

## 2016-08-04 NOTE — Telephone Encounter (Signed)
called Pt back, and called pharmacy, I fixed the confusion.

## 2016-08-04 NOTE — Telephone Encounter (Signed)
Pt called stating her Eliquis was called in wrong states the pharmacy is requiring her to have it for 3 months. Would like it resent over.

## 2016-08-05 DIAGNOSIS — G8929 Other chronic pain: Secondary | ICD-10-CM | POA: Diagnosis not present

## 2016-08-05 DIAGNOSIS — I69398 Other sequelae of cerebral infarction: Secondary | ICD-10-CM | POA: Diagnosis not present

## 2016-08-05 DIAGNOSIS — I671 Cerebral aneurysm, nonruptured: Secondary | ICD-10-CM | POA: Diagnosis not present

## 2016-08-05 DIAGNOSIS — Z87440 Personal history of urinary (tract) infections: Secondary | ICD-10-CM | POA: Diagnosis not present

## 2016-08-05 DIAGNOSIS — Z86711 Personal history of pulmonary embolism: Secondary | ICD-10-CM | POA: Diagnosis not present

## 2016-08-05 DIAGNOSIS — I10 Essential (primary) hypertension: Secondary | ICD-10-CM | POA: Diagnosis not present

## 2016-08-05 DIAGNOSIS — H53461 Homonymous bilateral field defects, right side: Secondary | ICD-10-CM | POA: Diagnosis not present

## 2016-08-05 DIAGNOSIS — F4323 Adjustment disorder with mixed anxiety and depressed mood: Secondary | ICD-10-CM | POA: Diagnosis not present

## 2016-08-05 DIAGNOSIS — M792 Neuralgia and neuritis, unspecified: Secondary | ICD-10-CM | POA: Diagnosis not present

## 2016-08-05 DIAGNOSIS — Z7901 Long term (current) use of anticoagulants: Secondary | ICD-10-CM

## 2016-08-05 DIAGNOSIS — M545 Low back pain: Secondary | ICD-10-CM | POA: Diagnosis not present

## 2016-08-05 DIAGNOSIS — I480 Paroxysmal atrial fibrillation: Secondary | ICD-10-CM | POA: Diagnosis not present

## 2016-08-05 DIAGNOSIS — Z96653 Presence of artificial knee joint, bilateral: Secondary | ICD-10-CM

## 2016-08-05 DIAGNOSIS — Z86718 Personal history of other venous thrombosis and embolism: Secondary | ICD-10-CM | POA: Diagnosis not present

## 2016-08-26 DIAGNOSIS — H539 Unspecified visual disturbance: Secondary | ICD-10-CM | POA: Diagnosis not present

## 2016-08-26 DIAGNOSIS — Z961 Presence of intraocular lens: Secondary | ICD-10-CM | POA: Diagnosis not present

## 2016-08-26 DIAGNOSIS — I69398 Other sequelae of cerebral infarction: Secondary | ICD-10-CM | POA: Diagnosis not present

## 2016-10-21 DIAGNOSIS — Z8673 Personal history of transient ischemic attack (TIA), and cerebral infarction without residual deficits: Secondary | ICD-10-CM | POA: Diagnosis not present

## 2016-10-21 DIAGNOSIS — Z01419 Encounter for gynecological examination (general) (routine) without abnormal findings: Secondary | ICD-10-CM | POA: Diagnosis not present

## 2016-10-21 DIAGNOSIS — Z1231 Encounter for screening mammogram for malignant neoplasm of breast: Secondary | ICD-10-CM | POA: Diagnosis not present

## 2016-10-22 ENCOUNTER — Telehealth: Payer: Self-pay | Admitting: Internal Medicine

## 2016-10-22 NOTE — Telephone Encounter (Signed)
Needs OV to eval BP prior to starting clonidine

## 2016-10-22 NOTE — Telephone Encounter (Signed)
Samantha Hausen, NP that she saw yesterday took her off the Estradiol Patches that she has been using for many years due to a stroke that she had in February for the worry that it could cause another one. She suggested that the pt use Clonidine 1 mg every day to help with her severe hot flashes. She wanted the pt to check with Dr Sharlet Salina to make sure that this medication would not interfere with anything else that she is taking and let Eve Key's office know so that she can send in the prescription. Please advise. The pt would like a call letting her know when this has been done.

## 2016-10-22 NOTE — Telephone Encounter (Signed)
Routing to Samantha Bishop, please advise in dr crawfords absence, thanks

## 2016-11-09 DIAGNOSIS — I8310 Varicose veins of unspecified lower extremity with inflammation: Secondary | ICD-10-CM | POA: Diagnosis not present

## 2016-11-11 IMAGING — CT CT ABD-PELV W/O CM
2 of 4 series · 15 of 46 positions shown, 17 images · non-contrast
Comparison: CT abdomen pelvis 09/17/2005.

CLINICAL DATA: Bright red blood from the rectum. Left lower
quadrant abdominal pain. Personal history of small bowel surgery for
obstruction.

EXAM:
CT ABDOMEN AND PELVIS WITHOUT CONTRAST
TECHNIQUE: Multidetector CT imaging of the abdomen and pelvis was performed
following the standard protocol without IV contrast.

[Series 2: ap without · axial · non-contrast · 0.68mm/px · z∈[-436,-26]mm · 12 of 90 slices shown, 14 images]
[im 4/90  soft-tissue]
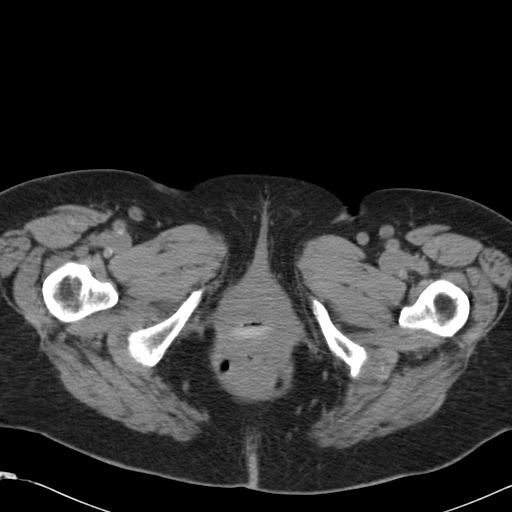
[im 4/90  bone]
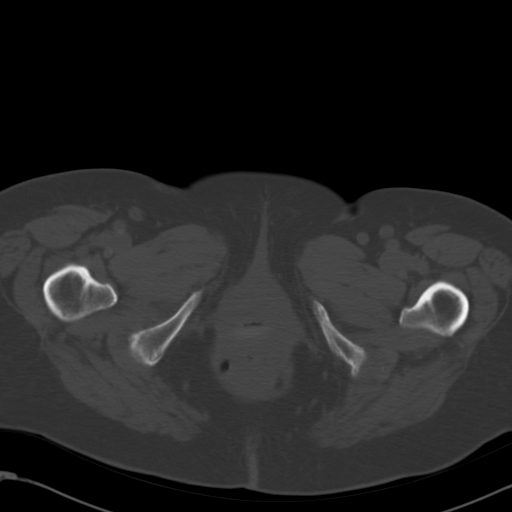
[im 12/90  soft-tissue]
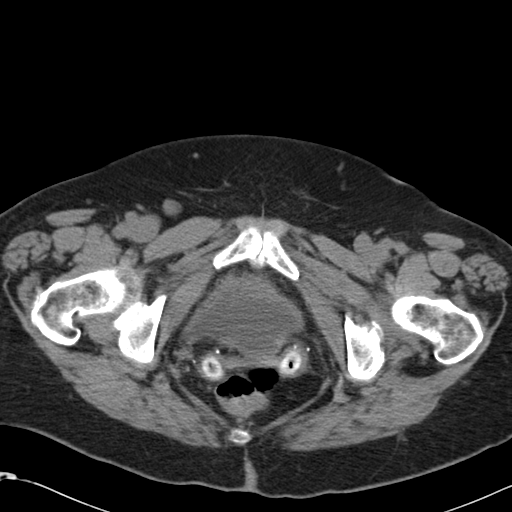
[im 19/90  soft-tissue]
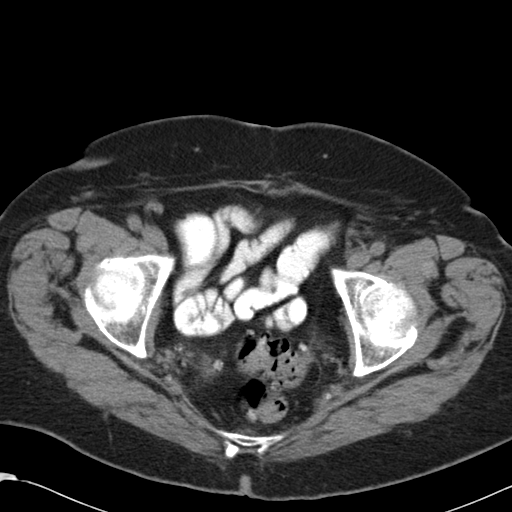
[im 26/90  soft-tissue]
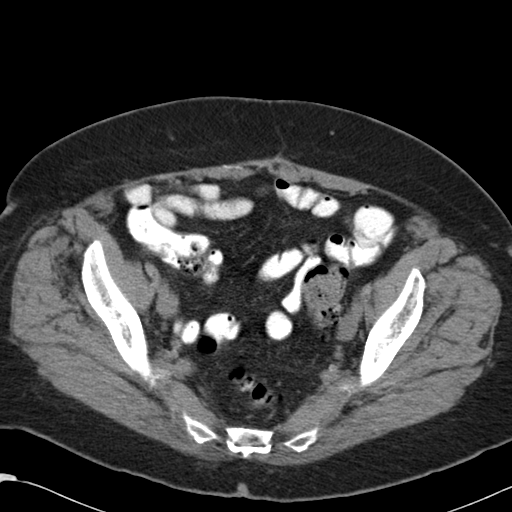
[im 34/90  soft-tissue]
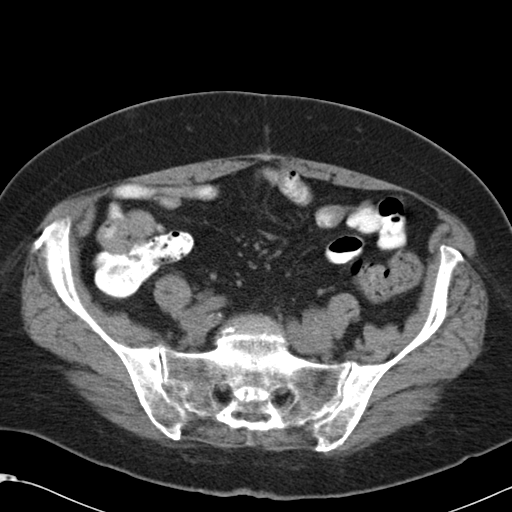
[im 41/90  soft-tissue]
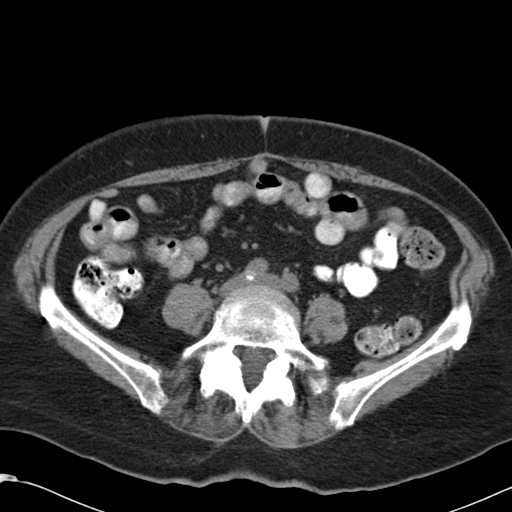
[im 49/90  soft-tissue]
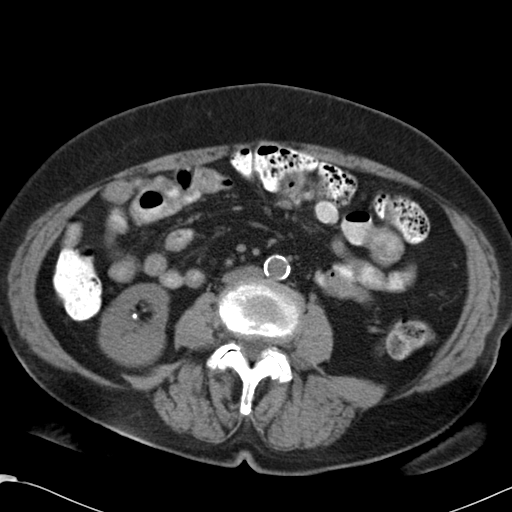
[im 56/90  soft-tissue]
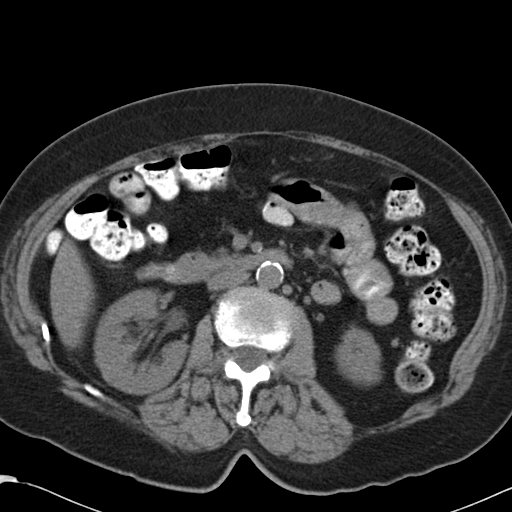
[im 64/90  soft-tissue]
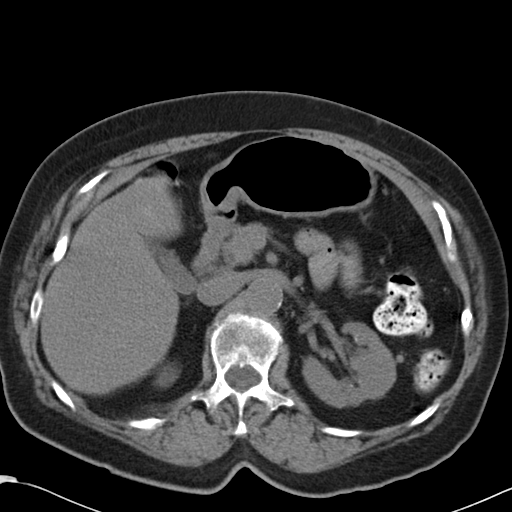
[im 64/90  bone]
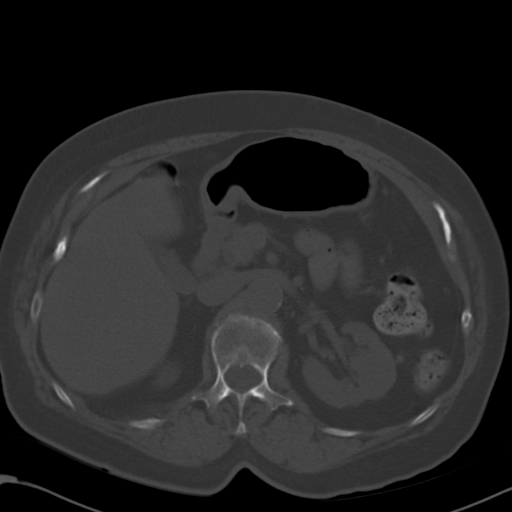
[im 71/90  soft-tissue]
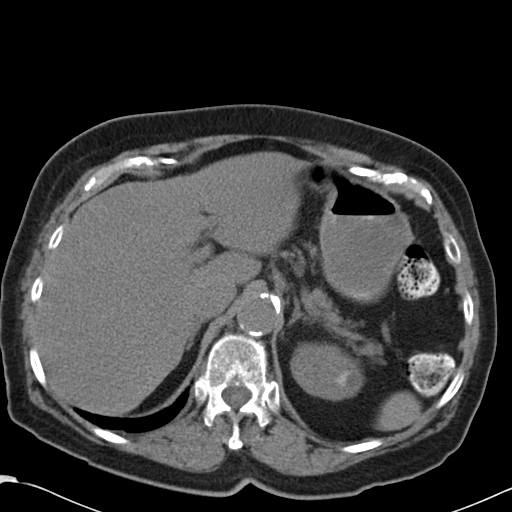
[im 78/90  soft-tissue]
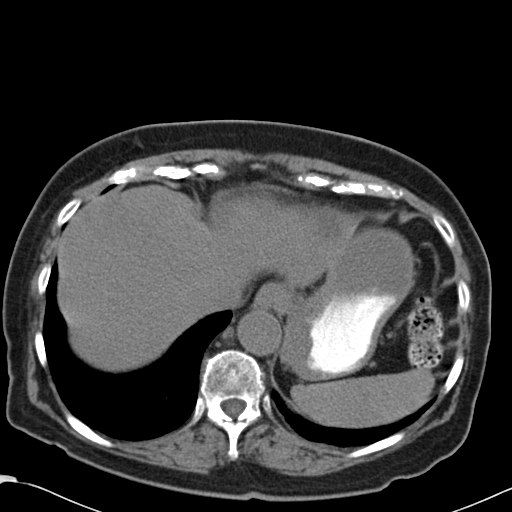
[im 86/90  soft-tissue]
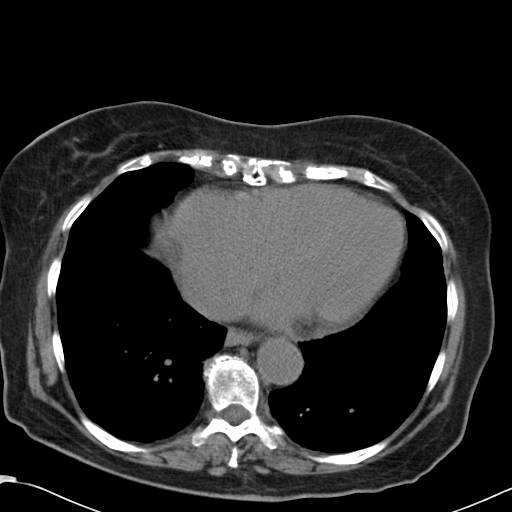

[Series 602: cor · coronal · 0.91mm/px · 3 of 120 slices shown]
[im 40/120  soft-tissue]
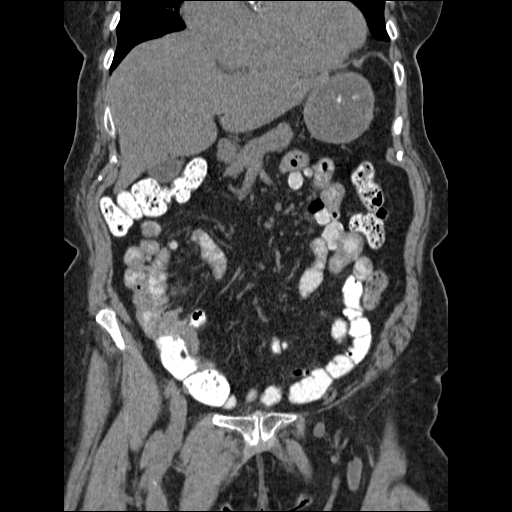
[im 53/120  soft-tissue]
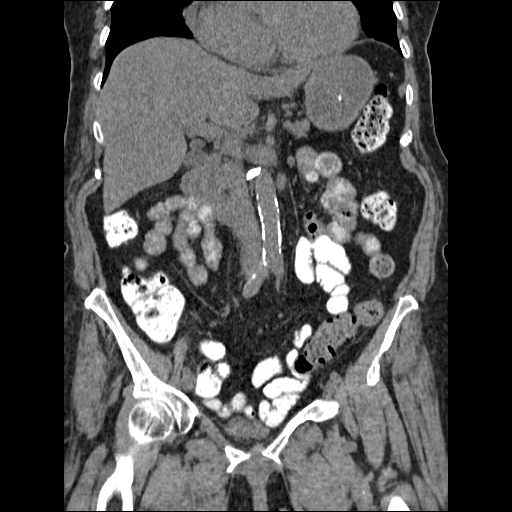
[im 67/120  soft-tissue]
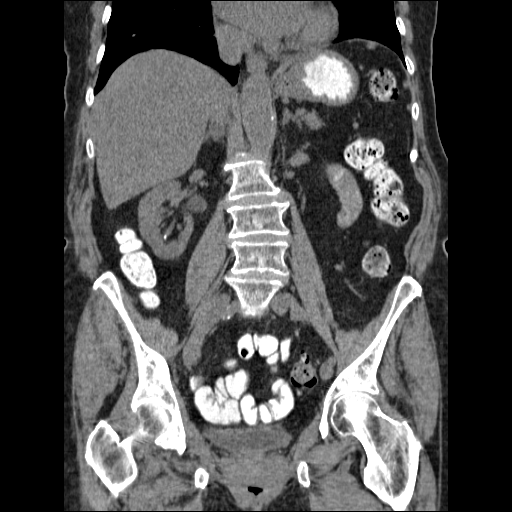

[15 of 46 positions shown; findings below may reference images not displayed]

FINDINGS: The lung bases are clear without focal nodule, mass, or airspace
disease. Mild cardiac enlargement has progressed since the prior
exam, particularly in the right atrium. There is no significant
pleural or pericardial effusion.

The the liver and spleen are within normal limits. Circumferential
thickening in the gastric fundus results in extreme narrowing of the
lumen. A more distal stomach and duodenum are within normal limits.
The pancreas is unremarkable. The common bile duct and gallbladder
are normal.

The adrenal glands are within normal limits bilaterally. A 5 mm
nonobstructing stone at the lower pole of the right kidney is
associated with focal atrophy. A smaller upper pole stone is also
nonobstructing. A 6 mm nonobstructing left lower pole stone is
present. Four or 5 additional smaller nonobstructing stones are
present. A mixed density partially exophytic lesion is present at
the upper pole of the left kidney measuring up to 16 mm. The ureters
are within normal limits bilaterally. The urinary bladder is mostly
collapsed.

Diverticular changes are present throughout the sigmoid colon. There
is no focal inflammation to suggest diverticulitis. There is no
obstruction. Oral contrast can be seen into the descending colon.
The more proximal colon is unremarkable. The appendix is not
visualized and may be surgically absent. The small bowel is within
normal limits. There is no obstruction. Adhesions are not excluded.

No significant adenopathy or free fluid is present. Atherosclerotic
changes are present within the abdominal aorta without aneurysm.

Degenerative changes are noted in the SI joints bilaterally.
Bilateral L5 pars defects are noted with slight anterolisthesis.
Degenerative facet changes are present at L3-4 and L4-5 as well with
minimal anterolisthesis at L3-4. Rightward curvature is present in
the lower thoracic spine. There is mild leftward curvature of the
lumbar spine.
IMPRESSION: 1. Sigmoid diverticulosis without evidence for diverticulitis.
2. The large a small bowel are otherwise unremarkable without
evidence for obstruction. There may be some small bowel adhesions.
3. Marked wall thickening in the fundus of the stomach with
significant compromise of the lumen. This could represent severe
focal gastritis. Neoplasm must be considered. Consider upper
endoscopy for further evaluation.
4. Bilateral nonobstructing nephrolithiasis.
5. Indeterminate 16 mm lesion at the upper pole of the left kidney.
Ultrasound or MRI with contrast may be used for further evaluation.
When the patient is clinically stable and able to follow directions
and hold their breath (preferably as an outpatient) further
evaluation with dedicated abdominal MRI should be considered.
6. Multilevel degenerative changes are present in the lumbar spine
and SI joints bilaterally.

## 2016-11-23 DIAGNOSIS — M25572 Pain in left ankle and joints of left foot: Secondary | ICD-10-CM | POA: Diagnosis not present

## 2016-11-30 DIAGNOSIS — I8310 Varicose veins of unspecified lower extremity with inflammation: Secondary | ICD-10-CM | POA: Diagnosis not present

## 2017-01-03 DIAGNOSIS — Z961 Presence of intraocular lens: Secondary | ICD-10-CM | POA: Diagnosis not present

## 2017-01-03 DIAGNOSIS — H04123 Dry eye syndrome of bilateral lacrimal glands: Secondary | ICD-10-CM | POA: Diagnosis not present

## 2017-02-08 ENCOUNTER — Encounter: Payer: Self-pay | Admitting: Internal Medicine

## 2017-02-08 ENCOUNTER — Ambulatory Visit (INDEPENDENT_AMBULATORY_CARE_PROVIDER_SITE_OTHER): Payer: Medicare Other | Admitting: Internal Medicine

## 2017-02-08 ENCOUNTER — Other Ambulatory Visit: Payer: Self-pay | Admitting: Internal Medicine

## 2017-02-08 DIAGNOSIS — R0781 Pleurodynia: Secondary | ICD-10-CM

## 2017-02-08 DIAGNOSIS — I639 Cerebral infarction, unspecified: Secondary | ICD-10-CM | POA: Diagnosis not present

## 2017-02-08 DIAGNOSIS — R232 Flushing: Secondary | ICD-10-CM | POA: Diagnosis not present

## 2017-02-08 MED ORDER — ESTRADIOL 0.1 MG/GM VA CREA: 1.0000 | TOPICAL_CREAM | Freq: Every day | VAGINAL | 12 refills | Status: DC

## 2017-02-08 MED ORDER — PAROXETINE HCL 10 MG PO TABS: 10.0000 mg | ORAL_TABLET | Freq: Every day | ORAL | 2 refills | Status: DC

## 2017-02-08 NOTE — Progress Notes (Signed)
   Subjective:    Patient ID: Samantha Bishop, female    DOB: [birth year], 83 y.o.   MRN: [id number]  HPI The patient is an 83 YO female coming in for right rib pain. Started about 1 week ago when she fell on her back patio. She did not have a phone but was able to get up by herself. She hit her right side and knee. She denies LOC or hitting her head. She was sore the next day. She denies fevers or chills. No nausea or vomiting. She is taking tylenol for the pain which is helping. She is also concerned about hot flashes and her gynecologist took her off the estrogen patches. She was concerned about her past history of stroke. She was given several alternatives including paxil and clonidine to ask [place] about.   Review of Systems  Constitutional: Negative.   HENT: Negative.   Eyes: Negative.   Respiratory: Negative for cough, chest tightness and shortness of breath.   Cardiovascular: Negative for chest pain, palpitations and leg swelling.  Gastrointestinal: Negative for abdominal distention, abdominal pain, constipation, diarrhea, nausea and vomiting.  Endocrine: Positive for heat intolerance.  Musculoskeletal: Positive for myalgias. Negative for arthralgias, back pain and neck stiffness.  Skin: Negative.   Neurological: Negative.   Psychiatric/Behavioral: Negative.       Objective:   Physical Exam  Constitutional: She is oriented to person, place, and time. She appears well-developed and well-nourished.  HENT:  Head: Normocephalic and atraumatic.  Eyes: EOM are normal.  Neck: Normal range of motion.  Cardiovascular: Normal rate and regular rhythm.   Pulmonary/Chest: Effort normal and breath sounds normal. No respiratory distress. She has no wheezes. She has no rales.  Abdominal: Soft. Bowel sounds are normal. She exhibits no distension. There is no tenderness. There is no rebound.  Musculoskeletal: She exhibits tenderness. She exhibits no edema.  Tenderness to palpation right 5th rib     Neurological: She is alert and oriented to person, place, and time. Coordination normal.  Skin: Skin is warm and dry.  Psychiatric: She has a normal mood and affect.   Vitals:   02/08/17 1330  BP: 138/80  Pulse: 85  Temp: 98.4 F (36.9 C)  TempSrc: Oral  SpO2: 95%  Weight: 188 lb (85.3 kg)  Height: 5' 5.5" (1.664 m)      Assessment & Plan:

## 2017-02-08 NOTE — Patient Instructions (Addendum)
We have sent in the paroxetine for the hot flashes. Take 1 pill daily.

## 2017-02-09 DIAGNOSIS — R232 Flushing: Secondary | ICD-10-CM | POA: Insufficient documentation

## 2017-02-09 DIAGNOSIS — R0781 Pleurodynia: Secondary | ICD-10-CM | POA: Insufficient documentation

## 2017-02-09 NOTE — Assessment & Plan Note (Signed)
Okay to use tylenol. No x-ray today as improving and no change to plan of care with rib fracture versus muscle injury.

## 2017-02-09 NOTE — Assessment & Plan Note (Signed)
Clonidine is not a good choice with her dizziness as it can cause postural hypotension. Rx for paxil 10 mg daily for hot flashes.

## 2017-03-25 ENCOUNTER — Ambulatory Visit: Payer: Self-pay | Admitting: Internal Medicine

## 2017-05-11 ENCOUNTER — Ambulatory Visit (INDEPENDENT_AMBULATORY_CARE_PROVIDER_SITE_OTHER)
Admission: RE | Admit: 2017-05-11 | Discharge: 2017-05-11 | Disposition: A | Discharge status: Home / Self Care | Payer: Medicare Other | Source: Ambulatory Visit | Attending: Internal Medicine | Admitting: Internal Medicine

## 2017-05-11 ENCOUNTER — Other Ambulatory Visit: Payer: Self-pay | Admitting: Internal Medicine

## 2017-05-11 ENCOUNTER — Ambulatory Visit: Payer: Self-pay | Admitting: Internal Medicine

## 2017-05-11 ENCOUNTER — Encounter: Payer: Self-pay | Admitting: Internal Medicine

## 2017-05-11 ENCOUNTER — Ambulatory Visit (INDEPENDENT_AMBULATORY_CARE_PROVIDER_SITE_OTHER): Payer: Medicare Other | Admitting: Internal Medicine

## 2017-05-11 VITALS — BP 130/88 | HR 88 | Ht 65.5 in | Wt 183.4 lb

## 2017-05-11 DIAGNOSIS — Z79899 Other long term (current) drug therapy: Secondary | ICD-10-CM

## 2017-05-11 DIAGNOSIS — I10 Essential (primary) hypertension: Secondary | ICD-10-CM

## 2017-05-11 DIAGNOSIS — I482 Chronic atrial fibrillation, unspecified: Secondary | ICD-10-CM

## 2017-05-11 DIAGNOSIS — R296 Repeated falls: Secondary | ICD-10-CM

## 2017-05-11 DIAGNOSIS — S0990XA Unspecified injury of head, initial encounter: Secondary | ICD-10-CM | POA: Diagnosis not present

## 2017-05-11 DIAGNOSIS — W19XXXA Unspecified fall, initial encounter: Secondary | ICD-10-CM | POA: Diagnosis not present

## 2017-05-11 MED ORDER — DOXYCYCLINE HYCLATE 100 MG PO CAPS: 100.0000 mg | ORAL_CAPSULE | Freq: Two times a day (BID) | ORAL | 0 refills | Status: AC

## 2017-05-11 NOTE — Addendum Note (Signed)
Addended by: Campbell Riches on: 05/11/2017 03:50 PM   Modules accepted: Orders

## 2017-05-11 NOTE — Progress Notes (Signed)
[name]      Patient Care Team: Hoyt Koch, MD as PCP - General (Internal Medicine)   HPI  Samantha Bishop is a 84 y.o. female seen in followup for atrial fibrillation associated with symptomatic palpitations. She is a CHADS VASC score of 4 and is on apixoban--properly dosed. She  has hypertension.  She has alopecia which is precluded the use of beta blockers.  She had a stroke 2/18 associated with a right homonymous hemianopsia and a left PCA infarct; old infarcts are also noted Sjhe was also found to have a small aneurysm of the left ICA  She has had a multitude of falls.  Most recently she fell on a step in her backyard last week and hit her head hard.  She has had no residual headache.  Couple of months ago she tripped on her patio and fell forward.  At home she walks with a cane although she has a walker.  She has had no palpitations.  She has noted no bleeding.  Date Cr Hgb  3/18 0.63 12.6        DATE TEST    2010 Echo    EF 60 %   2/18 Echo    EF 65 % Severe LAE(42/2.1/46)           There is no significant issues with shortness of breath or chest pain.        Past Medical History:  Diagnosis Date  . Anxiety state, unspecified   . Calculus of kidney   . Cerebral aneurysm, nonruptured   . Colon polyp   . Complication of anesthesia    "my heart stopped" 2004 knee  surg - see note on chart  . DEEP VENOUS THROMBOPHLEBITIS 08/15/2007   Qualifier: History of  By: Lenna Gilford MD, Deborra Medina   . Diverticulosis of colon (without mention of hemorrhage)   . History of skin cancer   . HTN (hypertension)   . Hyperlipidemia   . Lumbago   . Osteoarthrosis, unspecified whether generalized or localized, unspecified site   . Permanent atrial fibrillation ([place])   . Pulmonary emboli ([place])    following remote knee arthroscopy surgery  . Ruptured lumbar disc   . Sleep apnea    stop bang score 4   . Unspecified hemorrhoids without mention of complication     Past Surgical  History:  Procedure Laterality Date  . ABDOMINAL HYSTERECTOMY    . APPENDECTOMY    . BLOCKED INTESTINE SURGERY    . BREAST CYST EXCISION    . HEMHORROIDECTOMY    . JOINT REPLACEMENT  2004   RT TOTAL KNEE  . KNEE ARTHROSCOPY     X2 L KNEE  . KNEE SURGERY    . NASAL SURGERY X2    . TONSILLECTOMY AND ADENOIDECTOMY    . TOTAL KNEE ARTHROPLASTY  09/03/2011   Procedure: TOTAL KNEE ARTHROPLASTY;  Surgeon: Gearlean Alf, MD;  Location: [facility];  Service: Orthopedics;  Laterality: Left;    Current Outpatient Medications  Medication Sig Dispense Refill  . apixaban (ELIQUIS) 5 MG TABS tablet Take 1 tablet (5 mg total) by mouth 2 (two) times daily. 180 tablet 3  . gabapentin (NEURONTIN) 100 MG capsule TAKE 3 CAPSULES BY MOUTH IN THE MORNING AND 4 CAPSULES AT NIGHT 630 capsule 3  . hydrochlorothiazide (HYDRODIURIL) 25 MG tablet TAKE 0.5 TABLETS (12.5 MG TOTAL) BY MOUTH DAILY. 45 tablet 1  . pravastatin (PRAVACHOL) 20 MG tablet Take 1 tablet (20 mg  total) by mouth daily at 6 PM. 90 tablet 3  . ranitidine (ZANTAC) 75 MG tablet Take 75 mg by mouth 2 (two) times daily.     No current facility-administered medications for this visit.     Allergies  Allergen Reactions  . Codeine     REACTION: causes nausea  . Penicillins     REACTION: rash    Review of Systems negative except from HPI and PMH  Physical Exam BP 130/88   Pulse 88   Ht 5' 5.5" (1.664 m)   Wt 183 lb 6.4 oz (83.2 kg)   SpO2 96%   BMI 30.06 kg/m  Well developed and nourished in no acute distress HENT normal Neck supple with JVP-flat Clear Regular rate and rhythm, no murmurs or gallops Abd-soft with active BS No Clubbing cyanosis edema Skin-warm and dry A & Oriented  Grossly normal sensory and motor function    ECG  Atrial Fibrillation @ 88 controlled ventricular response  Assessment and  Plan  Atrial fibrillation  Hypertension  Fatigue  Falls-recurrent  She is stable from cardiac perspective   On  Anticoagulation;  No bleeding issues   With fall will need head CT  BP reasonable  Discussed the importance of adjusting the home environment to try to avoid falls, changing rugs. eliminating fall risks etc.  We spent more than 50% of our >25 min visit in face to face counseling regarding the above

## 2017-05-11 NOTE — Patient Instructions (Signed)
Medication Instructions: Your physician recommends that you continue on your current medications as directed. Please refer to the Current Medication list given to you today.  Labwork: None Ordered  Procedures/Testing: CT of the Head  Follow-Up: Your physician wants you to follow-up in: 1 YEAR with Dr. Caryl Comes. You will receive a reminder letter in the mail two months in advance. If you don't receive a letter, please call our office to schedule the follow-up appointment.   Any Additional Special Instructions Will Be Listed Below (If Applicable).     If you need a refill on your cardiac medications before your next appointment, please call your pharmacy.

## 2017-06-22 ENCOUNTER — Other Ambulatory Visit: Payer: Self-pay | Admitting: Internal Medicine

## 2017-08-02 ENCOUNTER — Other Ambulatory Visit: Payer: Self-pay

## 2017-08-02 MED ORDER — HYDROCHLOROTHIAZIDE 25 MG PO TABS: 12.5000 mg | ORAL_TABLET | Freq: Every day | ORAL | 1 refills | Status: DC

## 2017-08-03 ENCOUNTER — Telehealth: Payer: Self-pay | Admitting: Internal Medicine

## 2017-08-03 NOTE — Telephone Encounter (Signed)
Copied from [place] [id number]. Topic: Quick Communication - Rx Refill/Question >> Aug 03, 2017 12:24 PM Scherrie Gerlach wrote: Medication: pravastatin (PRAVACHOL) 20 MG tablet                    gabapentin (NEURONTIN) 100 MG capsule                    apixaban (ELIQUIS) 5 MG TABS tablet                     ranitidine (ZANTAC) 75 MG tablet Has the patient contacted their pharmacy? no  [id number] - , [place] [phone] (Phone) [phone] (Fax)  Pt has appt in October, 2019

## 2017-08-03 NOTE — Telephone Encounter (Signed)
Pt requesting refill on ranitidine 75 mg tab. This med has a historical provider.  Pharmacy: [id number], [place], [place] Provider: Lesly Rubenstein, MD NOV 02/09/18 Please advise.

## 2017-08-03 NOTE — Telephone Encounter (Signed)
Is is appropriate to refills patients medication? Patient's last labs were march of 2018. Has a year follow up scheduled for October.

## 2017-08-04 MED ORDER — APIXABAN 5 MG PO TABS: 5.0000 mg | ORAL_TABLET | Freq: Two times a day (BID) | ORAL | 0 refills | Status: DC

## 2017-08-04 MED ORDER — RANITIDINE HCL 75 MG PO TABS: 75.0000 mg | ORAL_TABLET | Freq: Two times a day (BID) | ORAL | 0 refills | Status: DC

## 2017-08-04 MED ORDER — PRAVASTATIN SODIUM 20 MG PO TABS: 20.0000 mg | ORAL_TABLET | Freq: Every day | ORAL | 0 refills | Status: DC

## 2017-08-04 MED ORDER — GABAPENTIN 100 MG PO CAPS: ORAL_CAPSULE | ORAL | 0 refills | Status: DC

## 2017-08-04 NOTE — Telephone Encounter (Signed)
Fine to refill zantac

## 2017-08-04 NOTE — Telephone Encounter (Signed)
Refills sent

## 2017-10-24 DIAGNOSIS — M25572 Pain in left ankle and joints of left foot: Secondary | ICD-10-CM | POA: Diagnosis not present

## 2017-10-26 ENCOUNTER — Other Ambulatory Visit: Payer: Self-pay | Admitting: Internal Medicine

## 2017-10-26 NOTE — Telephone Encounter (Signed)
NOV: 02/09/2018 for CPE

## 2017-10-31 ENCOUNTER — Other Ambulatory Visit: Payer: Self-pay | Admitting: Internal Medicine

## 2017-11-08 DIAGNOSIS — I872 Venous insufficiency (chronic) (peripheral): Secondary | ICD-10-CM | POA: Diagnosis not present

## 2017-11-08 DIAGNOSIS — R600 Localized edema: Secondary | ICD-10-CM | POA: Diagnosis not present

## 2017-11-08 DIAGNOSIS — M79605 Pain in left leg: Secondary | ICD-10-CM | POA: Diagnosis not present

## 2017-11-08 DIAGNOSIS — M79604 Pain in right leg: Secondary | ICD-10-CM | POA: Diagnosis not present

## 2017-11-15 DIAGNOSIS — M79605 Pain in left leg: Secondary | ICD-10-CM | POA: Diagnosis not present

## 2017-11-15 DIAGNOSIS — M79604 Pain in right leg: Secondary | ICD-10-CM | POA: Diagnosis not present

## 2017-11-15 DIAGNOSIS — R600 Localized edema: Secondary | ICD-10-CM | POA: Diagnosis not present

## 2017-11-17 DIAGNOSIS — I8311 Varicose veins of right lower extremity with inflammation: Secondary | ICD-10-CM | POA: Diagnosis not present

## 2017-11-17 DIAGNOSIS — I8312 Varicose veins of left lower extremity with inflammation: Secondary | ICD-10-CM | POA: Diagnosis not present

## 2017-11-17 DIAGNOSIS — I87021 Postthrombotic syndrome with inflammation of right lower extremity: Secondary | ICD-10-CM | POA: Diagnosis not present

## 2017-11-17 DIAGNOSIS — I87022 Postthrombotic syndrome with inflammation of left lower extremity: Secondary | ICD-10-CM | POA: Diagnosis not present

## 2018-01-02 DIAGNOSIS — M25572 Pain in left ankle and joints of left foot: Secondary | ICD-10-CM | POA: Diagnosis not present

## 2018-01-06 ENCOUNTER — Other Ambulatory Visit: Payer: Self-pay

## 2018-01-06 DIAGNOSIS — I872 Venous insufficiency (chronic) (peripheral): Secondary | ICD-10-CM

## 2018-01-20 ENCOUNTER — Other Ambulatory Visit: Payer: Self-pay | Admitting: Internal Medicine

## 2018-02-09 ENCOUNTER — Ambulatory Visit (INDEPENDENT_AMBULATORY_CARE_PROVIDER_SITE_OTHER): Payer: Medicare Other | Admitting: Internal Medicine

## 2018-02-09 ENCOUNTER — Other Ambulatory Visit (INDEPENDENT_AMBULATORY_CARE_PROVIDER_SITE_OTHER): Payer: Medicare Other

## 2018-02-09 ENCOUNTER — Telehealth: Payer: Self-pay | Admitting: Internal Medicine

## 2018-02-09 ENCOUNTER — Encounter: Payer: Self-pay | Admitting: Internal Medicine

## 2018-02-09 VITALS — BP 120/70 | HR 86 | Temp 98.6°F | Ht 65.5 in | Wt 173.0 lb

## 2018-02-09 DIAGNOSIS — R5383 Other fatigue: Secondary | ICD-10-CM

## 2018-02-09 DIAGNOSIS — I63332 Cerebral infarction due to thrombosis of left posterior cerebral artery: Secondary | ICD-10-CM | POA: Diagnosis not present

## 2018-02-09 DIAGNOSIS — E538 Deficiency of other specified B group vitamins: Secondary | ICD-10-CM

## 2018-02-09 DIAGNOSIS — I10 Essential (primary) hypertension: Secondary | ICD-10-CM | POA: Diagnosis not present

## 2018-02-09 DIAGNOSIS — E559 Vitamin D deficiency, unspecified: Secondary | ICD-10-CM | POA: Diagnosis not present

## 2018-02-09 DIAGNOSIS — E785 Hyperlipidemia, unspecified: Secondary | ICD-10-CM | POA: Diagnosis not present

## 2018-02-09 DIAGNOSIS — Z0000 Encounter for general adult medical examination without abnormal findings: Secondary | ICD-10-CM | POA: Diagnosis not present

## 2018-02-09 DIAGNOSIS — M546 Pain in thoracic spine: Secondary | ICD-10-CM

## 2018-02-09 DIAGNOSIS — G8929 Other chronic pain: Secondary | ICD-10-CM | POA: Diagnosis not present

## 2018-02-09 DIAGNOSIS — Z8673 Personal history of transient ischemic attack (TIA), and cerebral infarction without residual deficits: Secondary | ICD-10-CM | POA: Diagnosis not present

## 2018-02-09 LAB — COMPREHENSIVE METABOLIC PANEL
ALT: 11 U/L (ref 0–35)
AST: 12 U/L (ref 0–37)
Albumin: 4.2 g/dL (ref 3.5–5.2)
Alkaline Phosphatase: 57 U/L (ref 39–117)
BUN: 19 mg/dL (ref 6–23)
CO2: 32 mEq/L (ref 19–32)
Calcium: 9.5 mg/dL (ref 8.4–10.5)
Chloride: 103 mEq/L (ref 96–112)
Creatinine, Ser: 0.65 mg/dL (ref 0.40–1.20)
GFR: 90.84 mL/min (ref >60.00)
Glucose, Bld: 116 mg/dL — ABNORMAL HIGH (ref 70–99)
Potassium: 3.6 mEq/L (ref 3.5–5.1)
Sodium: 140 mEq/L (ref 135–145)
Total Bilirubin: 0.5 mg/dL (ref 0.2–1.2)
Total Protein: 7.1 g/dL (ref 6.0–8.3)

## 2018-02-09 LAB — CBC
HCT: 34 % — ABNORMAL LOW (ref 36.0–46.0)
Hemoglobin: 11.3 g/dL — ABNORMAL LOW (ref 12.0–15.0)
MCHC: 33.2 g/dL (ref 30.0–36.0)
MCV: 87.9 fl (ref 78.0–100.0)
Platelets: 211 10*3/uL (ref 150.0–400.0)
RBC: 3.86 Mil/uL — ABNORMAL LOW (ref 3.87–5.11)
RDW: 13.7 % (ref 11.5–15.5)
WBC: 6.4 10*3/uL (ref 4.0–10.5)

## 2018-02-09 LAB — LIPID PANEL
Cholesterol: 120 mg/dL (ref 0–200)
HDL: 50.6 mg/dL (ref >39.00)
LDL Cholesterol: 46 mg/dL (ref 0–99)
NonHDL: 69.37
Total CHOL/HDL Ratio: 2
Triglycerides: 116 mg/dL (ref 0.0–149.0)
VLDL: 23.2 mg/dL (ref 0.0–40.0)

## 2018-02-09 LAB — TSH: TSH: 1.89 u[IU]/mL (ref 0.35–4.50)

## 2018-02-09 LAB — VITAMIN D 25 HYDROXY (VIT D DEFICIENCY, FRACTURES): VITD: 22.83 ng/mL — ABNORMAL LOW (ref 30.00–100.00)

## 2018-02-09 LAB — VITAMIN B12: Vitamin B-12: 187 pg/mL — ABNORMAL LOW (ref 211–911)

## 2018-02-09 MED ORDER — HYDROCHLOROTHIAZIDE 25 MG PO TABS: 12.5000 mg | ORAL_TABLET | Freq: Every day | ORAL | 3 refills | Status: DC

## 2018-02-09 MED ORDER — GABAPENTIN 100 MG PO CAPS: 300.0000 mg | ORAL_CAPSULE | Freq: Two times a day (BID) | ORAL | 3 refills | Status: DC

## 2018-02-09 MED ORDER — PRAVASTATIN SODIUM 20 MG PO TABS: 20.0000 mg | ORAL_TABLET | Freq: Every day | ORAL | 3 refills | Status: DC

## 2018-02-09 MED ORDER — APIXABAN 5 MG PO TABS: ORAL_TABLET | ORAL | 3 refills | Status: DC

## 2018-02-09 MED ORDER — RANITIDINE HCL 75 MG PO TABS: 75.0000 mg | ORAL_TABLET | Freq: Two times a day (BID) | ORAL | 3 refills | Status: DC

## 2018-02-09 NOTE — Telephone Encounter (Signed)
No documentation found on this. Please advise

## 2018-02-09 NOTE — Telephone Encounter (Signed)
Copied from [place] [id number]. Topic: General - Other >> Feb 09, 2018  4:48 PM Keene Breath wrote: Reason for CRM: Patient called to speak with the doctor regarding a nasal spray that she thought Dr. Sharlet Salina was supposed to send into her drug store after her appointment today.  Patient stated that the drugstore does not have a record of the script.  Please advise.  CB# [phone]

## 2018-02-09 NOTE — Progress Notes (Signed)
   Subjective:    Patient ID: Samantha Bishop, female    DOB: February 10, 1925, 84 y.o.   MRN: [id number]  HPI Here for medicare wellness, no new complaints. Please see A/P for status and treatment of chronic medical problems.   HPI #2: Here for follow up hypertension (taking hctz and BP at goal, denies side effects, denies chest pains or headaches, denies swelling), and her cholesterol (taking pravastatin, denies side effects, denies heart attack or stroke symptoms, denies chest pains or SOB or headaches), and her back pain (taking gabapentin for pain up to twice a day, uses as needed, denies worsening, stable back pain, does not want to pursue surgery, denies falls recently).   Diet: heart healthy Physical activity: sedentary Depression/mood screen: negative Hearing: bilateral aids, moderate loss persistent Visual acuity: grossly normal, performs annual eye exam  ADLs: capable Fall risk: none Home safety: good Cognitive evaluation: intact to orientation, naming, recall and repetition EOL planning: adv directives discussed  I have personally reviewed and have noted 1. The patient's medical and social history - reviewed today no changes 2. Their use of alcohol, tobacco or illicit drugs 3. Their current medications and supplements 4. The patient's functional ability including ADL's, fall risks, home safety risks and hearing or visual impairment. 5. Diet and physical activities 6. Evidence for depression or mood disorders 7. Care team reviewed and updated (available in snapshot)  Review of Systems  Constitutional: Negative.   HENT: Negative.   Eyes: Negative.   Respiratory: Negative for cough, chest tightness and shortness of breath.   Cardiovascular: Negative for chest pain, palpitations and leg swelling.  Gastrointestinal: Negative for abdominal distention, abdominal pain, constipation, diarrhea, nausea and vomiting.  Musculoskeletal: Negative.   Skin: Negative.   Neurological:  Negative.   Psychiatric/Behavioral: Negative.       Objective:   Physical Exam  Constitutional: She is oriented to person, place, and time. She appears well-developed and well-nourished.  HENT:  Head: Normocephalic and atraumatic.  Eyes: EOM are normal.  Neck: Normal range of motion.  Cardiovascular: Normal rate and regular rhythm.  Pulmonary/Chest: Effort normal and breath sounds normal. No respiratory distress. She has no wheezes. She has no rales.  Abdominal: Soft. Bowel sounds are normal. She exhibits no distension. There is no tenderness. There is no rebound.  Musculoskeletal: She exhibits no edema.  Neurological: She is alert and oriented to person, place, and time. Coordination normal.  Skin: Skin is warm and dry.  Psychiatric: She has a normal mood and affect.   Vitals:   02/09/18 1316  BP: 120/70  Pulse: 86  Temp: 98.6 F (37 C)  TempSrc: Oral  SpO2: 95%  Weight: 173 lb (78.5 kg)  Height: 5' 5.5" (1.664 m)      Assessment & Plan:

## 2018-02-09 NOTE — Patient Instructions (Addendum)
We have checked the labs today and will call you back about the results.   Health Maintenance, Female Adopting a healthy lifestyle and getting preventive care can go a long way to promote health and wellness. Talk with your health care provider about what schedule of regular examinations is right for you. This is a good chance for you to check in with your provider about disease prevention and staying healthy. In between checkups, there are plenty of things you can do on your own. Experts have done a lot of research about which lifestyle changes and preventive measures are most likely to keep you healthy. Ask your health care provider for more information. Weight and diet Eat a healthy diet  Be sure to include plenty of vegetables, fruits, low-fat dairy products, and lean protein.  Do not eat a lot of foods high in solid fats, added sugars, or salt.  Get regular exercise. This is one of the most important things you can do for your health. ? Most adults should exercise for at least 150 minutes each week. The exercise should increase your heart rate and make you sweat (moderate-intensity exercise). ? Most adults should also do strengthening exercises at least twice a week. This is in addition to the moderate-intensity exercise.  Maintain a healthy weight  Body mass index (BMI) is a measurement that can be used to identify possible weight problems. It estimates body fat based on height and weight. Your health care provider can help determine your BMI and help you achieve or maintain a healthy weight.  For females 82 years of age and older: ? A BMI below 18.5 is considered underweight. ? A BMI of 18.5 to 24.9 is normal. ? A BMI of 25 to 29.9 is considered overweight. ? A BMI of 30 and above is considered obese.  Watch levels of cholesterol and blood lipids  You should start having your blood tested for lipids and cholesterol at 82 years of age, then have this test every 5 years.  You may  need to have your cholesterol levels checked more often if: ? Your lipid or cholesterol levels are high. ? You are older than 82 years of age. ? You are at high risk for heart disease.  Cancer screening Lung Cancer  Lung cancer screening is recommended for adults 62-79 years old who are at high risk for lung cancer because of a history of smoking.  A yearly low-dose CT scan of the lungs is recommended for people who: ? Currently smoke. ? Have quit within the past 15 years. ? Have at least a 30-pack-year history of smoking. A pack year is smoking an average of one pack of cigarettes a day for 1 year.  Yearly screening should continue until it has been 15 years since you quit.  Yearly screening should stop if you develop a health problem that would prevent you from having lung cancer treatment.  Breast Cancer  Practice breast self-awareness. This means understanding how your breasts normally appear and feel.  It also means doing regular breast self-exams. Let your health care provider know about any changes, no matter how small.  If you are in your 20s or 30s, you should have a clinical breast exam (CBE) by a health care provider every 1-3 years as part of a regular health exam.  If you are 39 or older, have a CBE every year. Also consider having a breast X-ray (mammogram) every year.  If you have a family history of breast cancer,  talk to your health care provider about genetic screening.  If you are at high risk for breast cancer, talk to your health care provider about having an MRI and a mammogram every year.  Breast cancer gene (BRCA) assessment is recommended for women who have family members with BRCA-related cancers. BRCA-related cancers include: ? Breast. ? Ovarian. ? Tubal. ? Peritoneal cancers.  Results of the assessment will determine the need for genetic counseling and BRCA1 and BRCA2 testing.  Cervical Cancer Your health care provider may recommend that you be  screened regularly for cancer of the pelvic organs (ovaries, uterus, and vagina). This screening involves a pelvic examination, including checking for microscopic changes to the surface of your cervix (Pap test). You may be encouraged to have this screening done every 3 years, beginning at age 21.  For women ages 29-65, health care providers may recommend pelvic exams and Pap testing every 3 years, or they may recommend the Pap and pelvic exam, combined with testing for human papilloma virus (HPV), every 5 years. Some types of HPV increase your risk of cervical cancer. Testing for HPV may also be done on women of any age with unclear Pap test results.  Other health care providers may not recommend any screening for nonpregnant women who are considered low risk for pelvic cancer and who do not have symptoms. Ask your health care provider if a screening pelvic exam is right for you.  If you have had past treatment for cervical cancer or a condition that could lead to cancer, you need Pap tests and screening for cancer for at least 20 years after your treatment. If Pap tests have been discontinued, your risk factors (such as having a new sexual partner) need to be reassessed to determine if screening should resume. Some women have medical problems that increase the chance of getting cervical cancer. In these cases, your health care provider may recommend more frequent screening and Pap tests.  Colorectal Cancer  This type of cancer can be detected and often prevented.  Routine colorectal cancer screening usually begins at 82 years of age and continues through 82 years of age.  Your health care provider may recommend screening at an earlier age if you have risk factors for colon cancer.  Your health care provider may also recommend using home test kits to check for hidden blood in the stool.  A small camera at the end of a tube can be used to examine your colon directly (sigmoidoscopy or colonoscopy).  This is done to check for the earliest forms of colorectal cancer.  Routine screening usually begins at age 82.  Direct examination of the colon should be repeated every 5-10 years through 82 years of age. However, you may need to be screened more often if early forms of precancerous polyps or small growths are found.  Skin Cancer  Check your skin from head to toe regularly.  Tell your health care provider about any new moles or changes in moles, especially if there is a change in a mole's shape or color.  Also tell your health care provider if you have a mole that is larger than the size of a pencil eraser.  Always use sunscreen. Apply sunscreen liberally and repeatedly throughout the day.  Protect yourself by wearing long sleeves, pants, a wide-brimmed hat, and sunglasses whenever you are outside.  Heart disease, diabetes, and high blood pressure  High blood pressure causes heart disease and increases the risk of stroke. High blood pressure is  more likely to develop in: ? People who have blood pressure in the high end of the normal range (130-139/85-89 mm Hg). ? People who are overweight or obese. ? People who are African American.  If you are 47-80 years of age, have your blood pressure checked every 3-5 years. If you are 94 years of age or older, have your blood pressure checked every year. You should have your blood pressure measured twice-once when you are at a hospital or clinic, and once when you are not at a hospital or clinic. Record the average of the two measurements. To check your blood pressure when you are not at a hospital or clinic, you can use: ? An automated blood pressure machine at a pharmacy. ? A home blood pressure monitor.  If you are between 34 years and 10 years old, ask your health care provider if you should take aspirin to prevent strokes.  Have regular diabetes screenings. This involves taking a blood sample to check your fasting blood sugar level. ? If  you are at a normal weight and have a low risk for diabetes, have this test once every three years after 82 years of age. ? If you are overweight and have a high risk for diabetes, consider being tested at a younger age or more often. Preventing infection Hepatitis B  If you have a higher risk for hepatitis B, you should be screened for this virus. You are considered at high risk for hepatitis B if: ? You were born in a country where hepatitis B is common. Ask your health care provider which countries are considered high risk. ? Your parents were born in a high-risk country, and you have not been immunized against hepatitis B (hepatitis B vaccine). ? You have HIV or AIDS. ? You use needles to inject street drugs. ? You live with someone who has hepatitis B. ? You have had sex with someone who has hepatitis B. ? You get hemodialysis treatment. ? You take certain medicines for conditions, including cancer, organ transplantation, and autoimmune conditions.  Hepatitis C  Blood testing is recommended for: ? Everyone born from [birth year] through 1963. ? Anyone with known risk factors for hepatitis C.  Sexually transmitted infections (STIs)  You should be screened for sexually transmitted infections (STIs) including gonorrhea and chlamydia if: ? You are sexually active and are younger than 82 years of age. ? You are older than 82 years of age and your health care provider tells you that you are at risk for this type of infection. ? Your sexual activity has changed since you were last screened and you are at an increased risk for chlamydia or gonorrhea. Ask your health care provider if you are at risk.  If you do not have HIV, but are at risk, it may be recommended that you take a prescription medicine daily to prevent HIV infection. This is called pre-exposure prophylaxis (PrEP). You are considered at risk if: ? You are sexually active and do not regularly use condoms or know the HIV status of your  partner(s). ? You take drugs by injection. ? You are sexually active with a partner who has HIV.  Talk with your health care provider about whether you are at high risk of being infected with HIV. If you choose to begin PrEP, you should first be tested for HIV. You should then be tested every 3 months for as long as you are taking PrEP. Pregnancy  If you are premenopausal and you  may become pregnant, ask your health care provider about preconception counseling.  If you may become pregnant, take 400 to 800 micrograms (mcg) of folic acid every day.  If you want to prevent pregnancy, talk to your health care provider about birth control (contraception). Osteoporosis and menopause  Osteoporosis is a disease in which the bones lose minerals and strength with aging. This can result in serious bone fractures. Your risk for osteoporosis can be identified using a bone density scan.  If you are 59 years of age or older, or if you are at risk for osteoporosis and fractures, ask your health care provider if you should be screened.  Ask your health care provider whether you should take a calcium or vitamin D supplement to lower your risk for osteoporosis.  Menopause may have certain physical symptoms and risks.  Hormone replacement therapy may reduce some of these symptoms and risks. Talk to your health care provider about whether hormone replacement therapy is right for you. Follow these instructions at home:  Schedule regular health, dental, and eye exams.  Stay current with your immunizations.  Do not use any tobacco products including cigarettes, chewing tobacco, or electronic cigarettes.  If you are pregnant, do not drink alcohol.  If you are breastfeeding, limit how much and how often you drink alcohol.  Limit alcohol intake to no more than 1 drink per day for nonpregnant women. One drink equals 12 ounces of beer, 5 ounces of wine, or 1 ounces of hard liquor.  Do not use street  drugs.  Do not share needles.  Ask your health care provider for help if you need support or information about quitting drugs.  Tell your health care provider if you often feel depressed.  Tell your health care provider if you have ever been abused or do not feel safe at home. This information is not intended to replace advice given to you by your health care provider. Make sure you discuss any questions you have with your health care provider. Document Released: 11/09/2010 Document Revised: 10/02/2015 Document Reviewed: 01/28/2015 Elsevier Interactive Patient Education  [employer].

## 2018-02-10 MED ORDER — FLUTICASONE PROPIONATE 50 MCG/ACT NA SUSP: 2.0000 | Freq: Every day | NASAL | 3 refills | Status: DC

## 2018-02-10 NOTE — Assessment & Plan Note (Signed)
Does have history of embolic stroke, now on eliquis. BP at goal and sugars at goal. Checking cholesterol and adjust as needed.

## 2018-02-10 NOTE — Assessment & Plan Note (Signed)
Flu shot declines. Pneumonia declines. Shingrix declines. Tetanus declines. Colonoscopy aged out. Mammogram aged out, pap smear aged out and dexa declines. Counseled about sun safety and mole surveillance. Counseled about the dangers of distracted driving. Given 10 year screening recommendations.  

## 2018-02-10 NOTE — Assessment & Plan Note (Signed)
Taking pravastatin daily, checking lipid panel and adjust for goal LDL <70.

## 2018-02-10 NOTE — Assessment & Plan Note (Signed)
BP at goal on hctz and checking CMP and adjust as needed.  

## 2018-02-10 NOTE — Telephone Encounter (Signed)
Sent in

## 2018-02-10 NOTE — Assessment & Plan Note (Signed)
Refill gabapentin and can use tylenol in between. No regular NSAID usage as on eliquis.

## 2018-02-28 ENCOUNTER — Ambulatory Visit (INDEPENDENT_AMBULATORY_CARE_PROVIDER_SITE_OTHER): Payer: Medicare Other | Admitting: Vascular Surgery

## 2018-02-28 ENCOUNTER — Ambulatory Visit (HOSPITAL_COMMUNITY)
Admission: RE | Admit: 2018-02-28 | Discharge: 2018-02-28 | Disposition: A | Discharge status: Home / Self Care | Payer: Medicare Other | Source: Ambulatory Visit | Attending: Vascular Surgery | Admitting: Vascular Surgery

## 2018-02-28 ENCOUNTER — Other Ambulatory Visit: Payer: Self-pay

## 2018-02-28 ENCOUNTER — Encounter: Payer: Self-pay | Admitting: Vascular Surgery

## 2018-02-28 VITALS — BP 146/71 | HR 79 | Temp 97.2°F | Resp 16 | Ht 66.5 in | Wt 167.0 lb

## 2018-02-28 DIAGNOSIS — I872 Venous insufficiency (chronic) (peripheral): Secondary | ICD-10-CM | POA: Insufficient documentation

## 2018-02-28 DIAGNOSIS — I63332 Cerebral infarction due to thrombosis of left posterior cerebral artery: Secondary | ICD-10-CM

## 2018-02-28 NOTE — Progress Notes (Signed)
Vascular and Vein Specialist of [facility]  Patient name: Samantha Bishop MRN: [id number] DOB: 1924/07/30 Sex: female  REASON FOR CONSULT: Evaluation venous hypertension  HPI: Samantha Bishop is a 84 y.o. female, who is here today for evaluation of venous hypertension.  She has erythema and thickening around the left medial ankle.  She has a history of prior treatment at [facility] and is unclear as what was done.  She was very unhappy recent evaluation there and felt that nothing was done.  He does have a remote history of DVT.  Past Medical History:  Diagnosis Date  . Anxiety state, unspecified   . Calculus of kidney   . Cerebral aneurysm, nonruptured   . Colon polyp   . Complication of anesthesia    "my heart stopped" 2004 knee  surg - see note on chart  . DEEP VENOUS THROMBOPHLEBITIS 08/15/2007   Qualifier: History of  By: Lenna Gilford MD, Deborra Medina   . Diverticulosis of colon (without mention of hemorrhage)   . History of skin cancer   . HTN (hypertension)   . Hyperlipidemia   . Lumbago   . Osteoarthrosis, unspecified whether generalized or localized, unspecified site   . Permanent atrial fibrillation   . Pulmonary emboli ([place])    following remote knee arthroscopy surgery  . Ruptured lumbar disc   . Sleep apnea    stop bang score 4   . Unspecified hemorrhoids without mention of complication     Family History  Problem Relation Age of Onset  . Coronary artery disease Father   . Stroke Father   . Cirrhosis Brother   . Colon cancer Son   . Colon polyps Neg Hx   . Kidney disease Neg Hx   . Diabetes Neg Hx     SOCIAL HISTORY: Social History   Socioeconomic History  . Marital status: Single    Spouse name: Not on file  . Number of children: 3  . Years of education: Not on file  . Highest education level: Not on file  Occupational History  . Occupation: Retired  Scientific laboratory technician  . Financial resource strain: Not on file  . Food  insecurity:    Worry: Not on file    Inability: Not on file  . Transportation needs:    Medical: Not on file    Non-medical: Not on file  Tobacco Use  . Smoking status: Never Smoker  . Smokeless tobacco: Never Used  Substance and Sexual Activity  . Alcohol use: Yes    Alcohol/week: 0.0 standard drinks    Comment: Rarely  . Drug use: No  . Sexual activity: Not Currently  Lifestyle  . Physical activity:    Days per week: Not on file    Minutes per session: Not on file  . Stress: Not on file  Relationships  . Social connections:    Talks on phone: Not on file    Gets together: Not on file    Attends religious service: Not on file    Active member of club or organization: Not on file    Attends meetings of clubs or organizations: Not on file    Relationship status: Not on file  . Intimate partner violence:    Fear of current or ex partner: Not on file    Emotionally abused: Not on file    Physically abused: Not on file    Forced sexual activity: Not on file  Other Topics Concern  . Not on  file  Social History Narrative  . Not on file    Allergies  Allergen Reactions  . Codeine     REACTION: causes nausea  . Penicillins     REACTION: rash    Current Outpatient Medications  Medication Sig Dispense Refill  . apixaban (ELIQUIS) 5 MG TABS tablet TAKE 1 TABLET (5 MG TOTAL) BY MOUTH 2 (TWO) TIMES DAILY. 180 tablet 3  . fluticasone (FLONASE) 50 MCG/ACT nasal spray Place 2 sprays into both nostrils daily. 48 g 3  . gabapentin (NEURONTIN) 100 MG capsule Take 3-4 capsules (300-400 mg total) by mouth 2 (two) times daily. 630 capsule 3  . hydrochlorothiazide (HYDRODIURIL) 25 MG tablet Take 0.5 tablets (12.5 mg total) by mouth daily. 45 tablet 3  . pravastatin (PRAVACHOL) 20 MG tablet Take 1 tablet (20 mg total) by mouth daily at 6 PM. 90 tablet 3  . ranitidine (ZANTAC) 75 MG tablet Take 1 tablet (75 mg total) by mouth 2 (two) times daily. 180 tablet 3   No current  facility-administered medications for this visit.     REVIEW OF SYSTEMS:  [X]  denotes positive finding, [ ]  denotes negative finding Cardiac  Comments:  Chest pain or chest pressure:    Shortness of breath upon exertion:    Short of breath when lying flat:    Irregular heart rhythm: x       Vascular    Pain in calf, thigh, or hip brought on by ambulation:    Pain in feet at night that wakes you up from your sleep:     Blood clot in your veins:    Leg swelling:  x       Pulmonary    Oxygen at home:    Productive cough:     Wheezing:         Neurologic    Sudden weakness in arms or legs:     Sudden numbness in arms or legs:     Sudden onset of difficulty speaking or slurred speech:    Temporary loss of vision in one eye:     Problems with dizziness:         Gastrointestinal    Blood in stool:     Vomited blood:         Genitourinary    Burning when urinating:     Blood in urine:        Psychiatric    Major depression:         Hematologic    Bleeding problems:    Problems with blood clotting too easily:        Skin    Rashes or ulcers:        Constitutional    Fever or chills:      PHYSICAL EXAM: Vitals:   02/28/18 1430  BP: (!) 146/71  Pulse: 79  Resp: 16  Temp: (!) 97.2 F (36.2 C)  TempSrc: Oral  SpO2: 94%  Weight: 167 lb (75.8 kg)  Height: 5' 6.5" (1.689 m)    GENERAL: The patient is a well-nourished female, in no acute distress. The vital signs are documented above. CARDIOVASCULAR: Dorsalis pedis pulses bilaterally.  Circumferential thickening and erythema above her left ankle.  She has an old healed scar on her right ankle from trauma in the past and is never had any open venous ulcerations PULMONARY: There is good air exchange  ABDOMEN: Soft and non-tender  MUSCULOSKELETAL: There are no major deformities or cyanosis. NEUROLOGIC: No focal weakness  or paresthesias are detected. SKIN: There are no ulcers or rashes noted. PSYCHIATRIC: The  patient has a normal affect.  DATA:  Venous duplex today shows reflux bilaterally.  No correctable superficial reflux  MEDICAL ISSUES: Gust this at length with the patient and her son present.  I explained the importance of elevation and compression.  I do not feel that there is any treatment options other than this and reassured her that there is no danger associated with this.  She was relieved with our discussion will see [place] again on an as-needed basis   Rosetta Posner, MD [facility] Vascular and Vein Specialists of [facility] Tel [phone] Pager [phone]

## 2018-03-06 ENCOUNTER — Telehealth: Payer: Self-pay

## 2018-03-06 NOTE — Telephone Encounter (Signed)
Patient called back and was unsure still what the medication was called. I went through patients medication list and stated that she takes all the ones I named off, asked if she gets medications from anyone else she stated no. Told patient to not take any other medications besides the ones we talked about. Patient stated understanding

## 2018-03-06 NOTE — Telephone Encounter (Signed)
Copied from [place] [id number]. Topic: General - Other >> Mar 06, 2018 10:56 AM Ivar Drape wrote: Reason for CRM:   Patient would like a call back to discuss the medication that was called in for her.  She stated she has never taken that medication before or has discussed it before.  Please advise.

## 2018-03-06 NOTE — Telephone Encounter (Signed)
LVM for patient to call back and let us know what medication she is talking about as I am unable to explain what it is for since I do not know what medication is in question.

## 2018-04-13 ENCOUNTER — Other Ambulatory Visit: Payer: Self-pay | Admitting: Internal Medicine

## 2018-04-17 ENCOUNTER — Other Ambulatory Visit: Payer: Self-pay | Admitting: Internal Medicine

## 2018-05-13 ENCOUNTER — Other Ambulatory Visit: Payer: Self-pay | Admitting: Internal Medicine

## 2018-05-30 ENCOUNTER — Telehealth: Payer: Self-pay | Admitting: Internal Medicine

## 2018-05-30 NOTE — Telephone Encounter (Signed)
Will do a PA tomorrow for patient

## 2018-05-30 NOTE — Telephone Encounter (Signed)
Patient came by the office stating that she received a letter that she did not understand. The letter from [facility] says that under her new insurance policy, GABAPENTIN is not covered. They suggest changing the drug to one in the formulary, requesting a prior authorization or requesting an exception from the criteria for coverage.  Can you help with this? The letter has been placed in Dr Nathanial Millman box.   The patient would like a call back with an update.

## 2018-05-31 ENCOUNTER — Telehealth: Payer: Self-pay

## 2018-05-31 NOTE — Telephone Encounter (Signed)
PA started on CoverMyMeds KEY: [id number]  Coverage Start Date:05/01/2018;Coverage End Date:05/31/2019

## 2018-05-31 NOTE — Telephone Encounter (Signed)
Pa done and was approved for 4 dollars

## 2018-07-29 ENCOUNTER — Other Ambulatory Visit: Payer: Self-pay | Admitting: Internal Medicine

## 2018-09-21 ENCOUNTER — Other Ambulatory Visit: Payer: Self-pay | Admitting: Internal Medicine

## 2018-10-18 ENCOUNTER — Telehealth: Payer: Self-pay | Admitting: Internal Medicine

## 2018-10-18 NOTE — Telephone Encounter (Signed)
Copied from [place] [id number]. Topic: Quick Communication - Rx Refill/Question >> Oct 18, 2018 10:01 AM Burchel, Abbi R wrote: Medication: apixaban (ELIQUIS) 5 MG TABS tablet, pravastatin (PRAVACHOL) 20 MG tablet  Preferred Pharmacy: [id number] - [place], [place]  [phone] (Phone) [phone] (Fax)    Pt was advised that RX refills may take up to 3 business days. We ask that you follow-up with your pharmacy.

## 2018-10-19 MED ORDER — PRAVASTATIN SODIUM 20 MG PO TABS: 20.0000 mg | ORAL_TABLET | Freq: Every day | ORAL | 1 refills | Status: DC

## 2018-10-19 MED ORDER — APIXABAN 5 MG PO TABS: ORAL_TABLET | ORAL | 1 refills | Status: DC

## 2018-10-19 NOTE — Telephone Encounter (Signed)
Sent in refills. Although after sending patient should have had refills left at pharmacy till October

## 2018-12-21 ENCOUNTER — Other Ambulatory Visit (INDEPENDENT_AMBULATORY_CARE_PROVIDER_SITE_OTHER): Payer: Medicare Other

## 2018-12-21 ENCOUNTER — Other Ambulatory Visit: Payer: Self-pay

## 2018-12-21 ENCOUNTER — Encounter: Payer: Self-pay | Admitting: Internal Medicine

## 2018-12-21 ENCOUNTER — Ambulatory Visit (INDEPENDENT_AMBULATORY_CARE_PROVIDER_SITE_OTHER): Payer: Medicare Other | Admitting: Internal Medicine

## 2018-12-21 VITALS — BP 150/70 | HR 84 | Temp 98.6°F | Ht 66.5 in | Wt 172.0 lb

## 2018-12-21 DIAGNOSIS — I10 Essential (primary) hypertension: Secondary | ICD-10-CM

## 2018-12-21 DIAGNOSIS — E782 Mixed hyperlipidemia: Secondary | ICD-10-CM | POA: Diagnosis not present

## 2018-12-21 DIAGNOSIS — G44319 Acute post-traumatic headache, not intractable: Secondary | ICD-10-CM

## 2018-12-21 LAB — CBC
HCT: 37 % (ref 36.0–46.0)
Hemoglobin: 12.1 g/dL (ref 12.0–15.0)
MCHC: 32.8 g/dL (ref 30.0–36.0)
MCV: 86.5 fl (ref 78.0–100.0)
Platelets: 222 10*3/uL (ref 150.0–400.0)
RBC: 4.28 Mil/uL (ref 3.87–5.11)
RDW: 14.4 % (ref 11.5–15.5)
WBC: 7.3 10*3/uL (ref 4.0–10.5)

## 2018-12-21 LAB — LIPID PANEL
Cholesterol: 136 mg/dL (ref 0–200)
HDL: 54.5 mg/dL (ref >39.00)
LDL Cholesterol: 61 mg/dL (ref 0–99)
NonHDL: 81.83
Total CHOL/HDL Ratio: 3
Triglycerides: 103 mg/dL (ref 0.0–149.0)
VLDL: 20.6 mg/dL (ref 0.0–40.0)

## 2018-12-21 LAB — COMPREHENSIVE METABOLIC PANEL
ALT: 10 U/L (ref 0–35)
AST: 14 U/L (ref 0–37)
Albumin: 4.7 g/dL (ref 3.5–5.2)
Alkaline Phosphatase: 64 U/L (ref 39–117)
BUN: 12 mg/dL (ref 6–23)
CO2: 29 mEq/L (ref 19–32)
Calcium: 10.1 mg/dL (ref 8.4–10.5)
Chloride: 102 mEq/L (ref 96–112)
Creatinine, Ser: 0.55 mg/dL (ref 0.40–1.20)
GFR: 103.44 mL/min (ref >60.00)
Glucose, Bld: 116 mg/dL — ABNORMAL HIGH (ref 70–99)
Potassium: 3.7 mEq/L (ref 3.5–5.1)
Sodium: 139 mEq/L (ref 135–145)
Total Bilirubin: 0.6 mg/dL (ref 0.2–1.2)
Total Protein: 7.8 g/dL (ref 6.0–8.3)

## 2018-12-21 NOTE — Patient Instructions (Signed)
We are going to check the labs and the scan of the brain.

## 2018-12-21 NOTE — Assessment & Plan Note (Signed)
Checking lipid panel and adjust pravastatin if needed.

## 2018-12-21 NOTE — Assessment & Plan Note (Signed)
Checking CT head due to eliquis and ongoing headaches to rule out bleeding in brain.

## 2018-12-21 NOTE — Progress Notes (Signed)
   Subjective:   Patient ID: Samantha Bishop, female    DOB: [birth year], 85 y.o.   MRN: [id number]  HPI The patient is a 85 YO female coming in for concerns about injury to her head (had a significant head injury in June with injury to head against tree with fall, denies LOC, did not seek care at the time, no skin tear at the time, has had headaches since, does take eliquis), and blood pressure (taking htcz, denies side effects, some headaches recently, 1 episode indigestion no chest pains), and cholesterol (taking pravastatin, denies side effects, denies chest pains or stroke symptoms).   Review of Systems  Constitutional: Negative.   HENT: Negative.   Eyes: Negative.   Respiratory: Negative for cough, chest tightness and shortness of breath.   Cardiovascular: Negative for chest pain, palpitations and leg swelling.  Gastrointestinal: Negative for abdominal distention, abdominal pain, constipation, diarrhea, nausea and vomiting.  Musculoskeletal: Negative.   Skin: Negative.   Neurological: Positive for headaches.  Psychiatric/Behavioral: Negative.     Objective:  Physical Exam Constitutional:      Appearance: She is well-developed.  HENT:     Head: Normocephalic and atraumatic.  Neck:     Musculoskeletal: Normal range of motion.  Cardiovascular:     Rate and Rhythm: Normal rate and regular rhythm.  Pulmonary:     Effort: Pulmonary effort is normal. No respiratory distress.     Breath sounds: Normal breath sounds. No wheezing or rales.  Abdominal:     General: Bowel sounds are normal. There is no distension.     Palpations: Abdomen is soft.     Tenderness: There is no abdominal tenderness. There is no rebound.  Musculoskeletal:        General: Tenderness present.     Comments: Tenderness right frontal region without skin lesion or bruising  Skin:    General: Skin is warm and dry.  Neurological:     Mental Status: She is alert and oriented to person, place, and time.   Coordination: Coordination normal.     Vitals:   12/21/18 1256  BP: (!) 150/70  Pulse: 84  Temp: 98.6 F (37 C)  TempSrc: Oral  SpO2: 97%  Weight: 172 lb (78 kg)  Height: 5' 6.5" (1.689 m)    Assessment & Plan:

## 2018-12-21 NOTE — Assessment & Plan Note (Signed)
BP at goal, taking hctz 25 mg daily, checking CMP and adjust as needed.

## 2019-01-04 ENCOUNTER — Ambulatory Visit
Admission: RE | Admit: 2019-01-04 | Discharge: 2019-01-04 | Disposition: A | Discharge status: Home / Self Care | Payer: Medicare Other | Source: Ambulatory Visit | Attending: Internal Medicine | Admitting: Internal Medicine

## 2019-01-04 ENCOUNTER — Other Ambulatory Visit: Payer: Self-pay

## 2019-01-04 DIAGNOSIS — S0990XA Unspecified injury of head, initial encounter: Secondary | ICD-10-CM | POA: Diagnosis not present

## 2019-01-04 DIAGNOSIS — R51 Headache: Secondary | ICD-10-CM | POA: Diagnosis not present

## 2019-01-04 DIAGNOSIS — G44319 Acute post-traumatic headache, not intractable: Secondary | ICD-10-CM

## 2019-03-26 DIAGNOSIS — Z23 Encounter for immunization: Secondary | ICD-10-CM | POA: Diagnosis not present

## 2019-04-27 ENCOUNTER — Other Ambulatory Visit: Payer: Self-pay | Admitting: Internal Medicine

## 2019-05-17 ENCOUNTER — Encounter: Payer: Self-pay | Admitting: Internal Medicine

## 2019-05-17 ENCOUNTER — Ambulatory Visit (INDEPENDENT_AMBULATORY_CARE_PROVIDER_SITE_OTHER): Payer: Medicare Other | Admitting: Internal Medicine

## 2019-05-17 ENCOUNTER — Other Ambulatory Visit: Payer: Self-pay

## 2019-05-17 VITALS — BP 136/78 | HR 80 | Temp 98.4°F | Resp 16 | Ht 66.5 in | Wt 179.0 lb

## 2019-05-17 DIAGNOSIS — L97911 Non-pressure chronic ulcer of unspecified part of right lower leg limited to breakdown of skin: Secondary | ICD-10-CM | POA: Diagnosis not present

## 2019-05-17 DIAGNOSIS — L97211 Non-pressure chronic ulcer of right calf limited to breakdown of skin: Secondary | ICD-10-CM

## 2019-05-17 DIAGNOSIS — L03115 Cellulitis of right lower limb: Secondary | ICD-10-CM | POA: Diagnosis not present

## 2019-05-17 DIAGNOSIS — I872 Venous insufficiency (chronic) (peripheral): Secondary | ICD-10-CM

## 2019-05-17 DIAGNOSIS — L97901 Non-pressure chronic ulcer of unspecified part of unspecified lower leg limited to breakdown of skin: Secondary | ICD-10-CM | POA: Insufficient documentation

## 2019-05-17 MED ORDER — OXYCODONE HCL 5 MG PO TABS: 5.0000 mg | ORAL_TABLET | ORAL | 0 refills | Status: DC | PRN

## 2019-05-17 MED ORDER — CEFDINIR 300 MG PO CAPS: 300.0000 mg | ORAL_CAPSULE | Freq: Two times a day (BID) | ORAL | 0 refills | Status: AC

## 2019-05-17 MED ORDER — VASCULERA PO TABS: 1.0000 | ORAL_TABLET | Freq: Every day | ORAL | 11 refills | Status: DC

## 2019-05-17 NOTE — Progress Notes (Signed)
Subjective:  Patient ID: Samantha Bishop, female    DOB: [birth year]  Age: 86 y.o. MRN: [id number]  CC: Cellulitis  This visit occurred during the SARS-CoV-2 public health emergency.  Safety protocols were in place, including screening questions prior to the visit, additional usage of staff PPE, and extensive cleaning of exam room while observing appropriate contact time as indicated for disinfecting solutions.   NEW TO ME  HPI LATERA WHILLOCK presents for concerns about her RLE.  She tells me she broke her right lower leg about 7 years ago and since then has had an ulcer in the area.  She tells me for the last week she has had worsening pain, redness, and swelling.  She had a scab on top of the ulcer which she removed.  When she removed the scab she said no blood or discharge was expressed.  She denies claudication or pain in her foot.  She denies nausea, vomiting, fever, or chills.  Outpatient Medications Prior to Visit  Medication Sig Dispense Refill  . apixaban (ELIQUIS) 5 MG TABS tablet TAKE 1 TABLET (5 MG TOTAL) BY MOUTH 2 (TWO) TIMES DAILY. 180 tablet 1  . fluticasone (FLONASE) 50 MCG/ACT nasal spray Place 2 sprays into both nostrils daily. 48 g 3  . gabapentin (NEURONTIN) 100 MG capsule TAKE 3-4 CAPSULES (300-400 MG TOTAL) BY MOUTH 2 (TWO) TIMES DAILY. 720 capsule 1  . hydrochlorothiazide (HYDRODIURIL) 25 MG tablet TAKE 1/2 TABLET BY MOUTH EVERY DAY 45 tablet 1  . pravastatin (PRAVACHOL) 20 MG tablet TAKE 1 TABLET (20 MG TOTAL) BY MOUTH DAILY AT 6 PM. 90 tablet 1  . ranitidine (ZANTAC) 75 MG tablet Take 1 tablet (75 mg total) by mouth 2 (two) times daily. 180 tablet 3   No facility-administered medications prior to visit.    ROS Review of Systems  Constitutional: Negative for appetite change, fatigue and fever.  HENT: Negative.   Eyes: Negative.   Respiratory: Negative for cough, chest tightness, shortness of breath and wheezing.   Cardiovascular: Negative for chest  pain, palpitations and leg swelling.  Gastrointestinal: Negative for abdominal pain, diarrhea, nausea and vomiting.  Endocrine: Negative.   Genitourinary: Negative.  Negative for difficulty urinating.  Musculoskeletal: Negative.  Negative for arthralgias and myalgias.  Skin: Positive for color change and wound.  Neurological: Negative.  Negative for dizziness, weakness and light-headedness.  Hematological: Negative for adenopathy. Does not bruise/bleed easily.  Psychiatric/Behavioral: Negative.     Objective:  BP 136/78 (BP Location: Left Arm, Patient Position: Sitting, Cuff Size: Normal)   Pulse 80   Temp 98.4 F (36.9 C) (Oral)   Resp 16   Ht 5' 6.5" (1.689 m)   Wt 179 lb (81.2 kg)   SpO2 95%   BMI 28.46 kg/m   BP Readings from Last 3 Encounters:  05/17/19 136/78  12/21/18 (!) 150/70  02/28/18 (!) 146/71    Wt Readings from Last 3 Encounters:  05/17/19 179 lb (81.2 kg)  12/21/18 172 lb (78 kg)  02/28/18 167 lb (75.8 kg)    Physical Exam Vitals reviewed.  Constitutional:      Appearance: Normal appearance.  HENT:     Nose: Nose normal.     Mouth/Throat:     Mouth: Mucous membranes are moist.  Eyes:     General: No scleral icterus.    Conjunctiva/sclera: Conjunctivae normal.  Cardiovascular:     Rate and Rhythm: Normal rate and regular rhythm.  Pulses:          Dorsalis pedis pulses are 1+ on the right side and 1+ on the left side.       Posterior tibial pulses are 1+ on the right side and 1+ on the left side.     Heart sounds: No murmur.  Pulmonary:     Effort: Pulmonary effort is normal. No respiratory distress.     Breath sounds: No stridor. No wheezing, rhonchi or rales.  Abdominal:     General: Abdomen is protuberant. Bowel sounds are normal.     Palpations: There is no hepatomegaly or splenomegaly.     Tenderness: There is no abdominal tenderness.  Musculoskeletal:     Cervical back: Neck supple.       Legs:     Comments: Over both lower  extremities, anteriorly, there are large areas of brawny induration, hyperpigmentation, and scale.  Feet:     Right foot:     Skin integrity: Skin integrity normal.     Left foot:     Skin integrity: Skin integrity normal.  Lymphadenopathy:     Cervical: No cervical adenopathy.  Skin:    General: Skin is warm.     Findings: Erythema present.  Neurological:     General: No focal deficit present.     Lab Results  Component Value Date   WBC 7.3 12/21/2018   HGB 12.1 12/21/2018   HCT 37.0 12/21/2018   PLT 222.0 12/21/2018   GLUCOSE 116 (H) 12/21/2018   CHOL 136 12/21/2018   TRIG 103.0 12/21/2018   HDL 54.50 12/21/2018   LDLCALC 61 12/21/2018   ALT 10 12/21/2018   AST 14 12/21/2018   NA 139 12/21/2018   K 3.7 12/21/2018   CL 102 12/21/2018   CREATININE 0.55 12/21/2018   BUN 12 12/21/2018   CO2 29 12/21/2018   TSH 1.89 02/09/2018   INR 1.44 06/26/2016   HGBA1C 6.3 (H) 06/27/2016    CT Head Wo Contrast  Result Date: 01/05/2019 CLINICAL DATA:  Head trauma, headache.  Recent fall.  On Eliquis. EXAM: CT HEAD WITHOUT CONTRAST TECHNIQUE: Contiguous axial images were obtained from the base of the skull through the vertex without intravenous contrast. COMPARISON:  CT head 05/11/2017 FINDINGS: Brain: Generalized atrophy without hydrocephalus. Chronic microvascular ischemic changes in the white matter. Chronic infarct left occipital lobe and right parietal lobe unchanged. Negative for acute hemorrhage. Negative for acute infarct or mass. No midline shift Vascular: Atherosclerotic calcification in the cavernous carotid bilaterally. Negative for hyperdense vessel Skull: Negative for fracture Sinuses/Orbits: Retention cyst left maxillary sinus. Remaining sinuses clear. Bilateral cataract surgery. Other: None IMPRESSION: No acute intracranial abnormality Atrophy and extensive chronic ischemic changes stable from prior study. Electronically Signed   By: Franchot Gallo M.D.   On: 01/05/2019 08:08     Assessment & Plan:   Ayoka was seen today for cellulitis.  Diagnoses and all orders for this visit:  Cellulitis of right lower extremity- I will treat the infection with cefdinir and will control the pain with oxycodone as needed. -     cefdinir (OMNICEF) 300 MG capsule; Take 1 capsule (300 mg total) by mouth 2 (two) times daily for 10 days. -     Discontinue: oxyCODONE (OXY IR/ROXICODONE) 5 MG immediate release tablet; Take 1 tablet (5 mg total) by mouth every 4 (four) hours as needed for severe pain. -     oxyCODONE (OXY IR/ROXICODONE) 5 MG immediate release tablet; Take 1 tablet (5  mg total) by mouth every 4 (four) hours as needed for severe pain.  Venous stasis ulcer of right calf limited to breakdown of skin without varicose veins ([place])- I have asked her to start taking vasculera to reduce the risk of complications and to improve the venous flow in her lower extremities. -     Dietary Management Product (VASCULERA) TABS; Take 1 capsule by mouth daily.  Ulcer of right lower extremity, limited to breakdown of skin ([place])- This is painful so I have prescribed oxycodone to control the discomfort.  There is no evidence of arterial insufficiency.  The ulcer does not appear infected but surrounding this is a diffuse area of erythema, most likely strep, I recommended she treat this with a broad-spectrum cephalosporin. -     Discontinue: oxyCODONE (OXY IR/ROXICODONE) 5 MG immediate release tablet; Take 1 tablet (5 mg total) by mouth every 4 (four) hours as needed for severe pain. -     oxyCODONE (OXY IR/ROXICODONE) 5 MG immediate release tablet; Take 1 tablet (5 mg total) by mouth every 4 (four) hours as needed for severe pain.   I have discontinued Macky Lower. Baena's ranitidine. I am also having her start on Vasculera and cefdinir. Additionally, I am having her maintain her fluticasone, gabapentin, apixaban, hydrochlorothiazide, pravastatin, and oxyCODONE.  Meds ordered this encounter    Medications  . Dietary Management Product (VASCULERA) TABS    Sig: Take 1 capsule by mouth daily.    Dispense:  30 tablet    Refill:  11  . cefdinir (OMNICEF) 300 MG capsule    Sig: Take 1 capsule (300 mg total) by mouth 2 (two) times daily for 10 days.    Dispense:  20 capsule    Refill:  0  . DISCONTD: oxyCODONE (OXY IR/ROXICODONE) 5 MG immediate release tablet    Sig: Take 1 tablet (5 mg total) by mouth every 4 (four) hours as needed for severe pain.    Dispense:  30 tablet    Refill:  0  . oxyCODONE (OXY IR/ROXICODONE) 5 MG immediate release tablet    Sig: Take 1 tablet (5 mg total) by mouth every 4 (four) hours as needed for severe pain.    Dispense:  30 tablet    Refill:  0     Follow-up: Return in about 3 weeks (around 06/07/2019).  Scarlette Calico, MD

## 2019-05-17 NOTE — Patient Instructions (Signed)

## 2019-05-24 ENCOUNTER — Emergency Department (HOSPITAL_COMMUNITY)
Admission: EM | Admit: 2019-05-24 | Discharge: 2019-05-24 | Disposition: A | Discharge status: Home / Self Care | Payer: Medicare Other | Attending: Emergency Medicine | Admitting: Emergency Medicine

## 2019-05-24 ENCOUNTER — Ambulatory Visit: Payer: Self-pay | Admitting: Internal Medicine

## 2019-05-24 ENCOUNTER — Other Ambulatory Visit: Payer: Self-pay

## 2019-05-24 DIAGNOSIS — I10 Essential (primary) hypertension: Secondary | ICD-10-CM | POA: Diagnosis not present

## 2019-05-24 DIAGNOSIS — Z79899 Other long term (current) drug therapy: Secondary | ICD-10-CM | POA: Diagnosis not present

## 2019-05-24 DIAGNOSIS — Z7901 Long term (current) use of anticoagulants: Secondary | ICD-10-CM | POA: Insufficient documentation

## 2019-05-24 DIAGNOSIS — Z96653 Presence of artificial knee joint, bilateral: Secondary | ICD-10-CM | POA: Diagnosis not present

## 2019-05-24 DIAGNOSIS — R040 Epistaxis: Secondary | ICD-10-CM | POA: Insufficient documentation

## 2019-05-24 DIAGNOSIS — I878 Other specified disorders of veins: Secondary | ICD-10-CM | POA: Insufficient documentation

## 2019-05-24 DIAGNOSIS — I872 Venous insufficiency (chronic) (peripheral): Secondary | ICD-10-CM | POA: Diagnosis not present

## 2019-05-24 MED ORDER — AMLODIPINE BESYLATE 5 MG PO TABS: 5.0000 mg | ORAL_TABLET | Freq: Every day | ORAL | 0 refills | Status: DC

## 2019-05-24 MED ORDER — HYDROCHLOROTHIAZIDE 12.5 MG PO CAPS
12.5000 mg | ORAL_CAPSULE | Freq: Once | ORAL | Status: AC
  Administered 2019-05-24: 12.5 mg via ORAL
  Filled 2019-05-24: qty 1

## 2019-05-24 MED ORDER — AMLODIPINE BESYLATE 5 MG PO TABS
5.0000 mg | ORAL_TABLET | Freq: Once | ORAL | Status: AC
  Administered 2019-05-24: 5 mg via ORAL
  Filled 2019-05-24: qty 1

## 2019-05-24 NOTE — ED Notes (Signed)
ozzie, mortellaro said to call him for ride home [phone].

## 2019-05-24 NOTE — ED Notes (Signed)
Patient denies pain and is resting comfortably.  

## 2019-05-24 NOTE — Telephone Encounter (Signed)
  Pt having sever nose bleed, on Eloquis and new antibiotic. Pt weak, light headed. Has been bleeding two days.She has no one to drive her, she is home alone. Advised to call 911. Pt Unlocked door so they can enter and is seated back down safely while I am on phone with her. Pt advised not to take either of the medications until she is advised.  Reason for Disposition . Fainted or too weak to stand following large blood loss  Answer Assessment - Initial Assessment Questions 1. AMOUNT OF BLEEDING: "How bad is the bleeding?" "How much blood was lost?" "Has the bleeding stopped?"   - MILD: needed a couple tissues   - MODERATE: needed many tissues   - SEVERE: large blood clots, soaked many tissues, lasted more than 30 minutes      No clots, started a few min ago 2. ONSET: "When did the nosebleed start?"    Off and on for several days 3. FREQUENCY: "How many nosebleeds have you had in the last 24 hours?"      first 4. RECURRENT SYMPTOMS: "Have there been other recent nosebleeds?" If so, ask: "How long did it take you to stop the bleeding?" "What worked best?"      no 5. CAUSE: "What do you think caused this nosebleed?"     eloquis 6. LOCAL FACTORS: "Do you have any cold symptoms?", "Have you been rubbing or picking at your nose?"     no 7. SYSTEMIC FACTORS: "Do you have high blood pressure or any bleeding problems?"     no 8. BLOOD THINNERS: "Do you take any blood thinners?" (e.g., coumadin, heparin, aspirin, Plavix)     Eloquis 9. OTHER SYMPTOMS: "Do you have any other symptoms?" (e.g., lightheadedness) Yes, light headed.  10. PREGNANCY: "Is there any chance you are pregnant?" "When was your last menstrual period?"       no  Protocols used: NOSEBLEED-A-AH

## 2019-05-24 NOTE — ED Provider Notes (Addendum)
[facility] DEPT Provider Note   CSN: [id number] Arrival date & time: 05/24/19  1616     History Chief Complaint  Patient presents with  . Epistaxis    Samantha Bishop is a 86 y.o. female.  HPI patient reports that she was started on an antibiotic (cefdinir) for infection in her right lower leg. She reports she saw her doctor and was told she had cellulitis. Patient has had problems with venous stasis and wounds. She reports that the wound was more red before and it does look improved. She however is concerned because 3 days ago she started getting sporadic but heavy intermittent bleeding from her nose. She reports once she also felt like there was a cracking sound in her face and then there was bleeding. She does not currently have any bleeding and has not had it today. She did contact her primary care doctor's office and was advised to come to the emergency department for assessment.    Past Medical History:  Diagnosis Date  . Anxiety state, unspecified   . Calculus of kidney   . Cerebral aneurysm, nonruptured   . Colon polyp   . Complication of anesthesia    "my heart stopped" 2004 knee  surg - see note on chart  . DEEP VENOUS THROMBOPHLEBITIS 08/15/2007   Qualifier: History of  By: Lenna Gilford MD, Deborra Medina   . Diverticulosis of colon (without mention of hemorrhage)   . History of skin cancer   . HTN (hypertension)   . Hyperlipidemia   . Lumbago   . Osteoarthrosis, unspecified whether generalized or localized, unspecified site   . Permanent atrial fibrillation ([place])   . Pulmonary emboli ([place])    following remote knee arthroscopy surgery  . Ruptured lumbar disc   . Sleep apnea    stop bang score 4   . Unspecified hemorrhoids without mention of complication     Patient Active Problem List   Diagnosis Date Noted  . Venous stasis ulcer limited to breakdown of skin without varicose veins ([place]) 05/17/2019  . Cellulitis of right lower extremity  05/17/2019  . Ulcer of right lower extremity, limited to breakdown of skin ([place]) 05/17/2019  . Acute post-traumatic headache, not intractable 12/21/2018  . Hot flashes 02/09/2017  . Adjustment disorder with mixed anxiety and depressed mood 06/28/2016  . CVA (cerebral vascular accident) ([place]) 06/26/2016  . Right homonymous hemianopsia 06/26/2016  . Back pain 04/08/2015  . Abnormal CT scan 04/08/2015  . Constipation 01/02/2015  . Intracranial aneurysm 10/18/2014  . Routine general medical examination at a health care facility 03/08/2014  . Occipital neuralgia 07/16/2013  . Venous insufficiency 07/11/2013  . Alopecia 06/08/2012  . Osteopenia 02/21/2010  . ANXIETY 05/21/2009  . Essential hypertension 05/21/2009  . Atrial fibrillation - followed by Dr. Caryl Comes 03/03/2009  . INTRACRANIAL ANEURYSM 08/16/2007  . Hyperlipidemia 08/15/2007  . GERD 08/15/2007    Past Surgical History:  Procedure Laterality Date  . ABDOMINAL HYSTERECTOMY    . APPENDECTOMY    . BLOCKED INTESTINE SURGERY    . BREAST CYST EXCISION    . HEMHORROIDECTOMY    . JOINT REPLACEMENT  2004   RT TOTAL KNEE  . KNEE ARTHROSCOPY     X2 L KNEE  . KNEE SURGERY    . NASAL SURGERY X2    . TONSILLECTOMY AND ADENOIDECTOMY    . TOTAL KNEE ARTHROPLASTY  09/03/2011   Procedure: TOTAL KNEE ARTHROPLASTY;  Surgeon: Gearlean Alf, MD;  Location: [facility];  Service: Orthopedics;  Laterality: Left;     OB History   No obstetric history on file.     Family History  Problem Relation Age of Onset  . Coronary artery disease Father   . Stroke Father   . Cirrhosis Brother   . Colon cancer Son   . Colon polyps Neg Hx   . Kidney disease Neg Hx   . Diabetes Neg Hx     Social History   Tobacco Use  . Smoking status: Never Smoker  . Smokeless tobacco: Never Used  Substance Use Topics  . Alcohol use: Yes    Alcohol/week: 0.0 standard drinks    Comment: Rarely  . Drug use: No    Home Medications Prior to Admission  medications   Medication Sig Start Date End Date Taking? Authorizing Provider  amLODipine (NORVASC) 5 MG tablet Take 1 tablet (5 mg total) by mouth daily. 05/24/19   Charlesetta Shanks, MD  apixaban (ELIQUIS) 5 MG TABS tablet TAKE 1 TABLET (5 MG TOTAL) BY MOUTH 2 (TWO) TIMES DAILY. 10/19/18   Hoyt Koch, MD  cefdinir (OMNICEF) 300 MG capsule Take 1 capsule (300 mg total) by mouth 2 (two) times daily for 10 days. 05/17/19 05/27/19  Janith Lima, MD  Dietary Management Product (VASCULERA) TABS Take 1 capsule by mouth daily. 05/17/19   Janith Lima, MD  fluticasone (FLONASE) 50 MCG/ACT nasal spray Place 2 sprays into both nostrils daily. 02/10/18   Hoyt Koch, MD  gabapentin (NEURONTIN) 100 MG capsule TAKE 3-4 CAPSULES (300-400 MG TOTAL) BY MOUTH 2 (TWO) TIMES DAILY. 09/21/18   Hoyt Koch, MD  hydrochlorothiazide (HYDRODIURIL) 25 MG tablet TAKE 1/2 TABLET BY MOUTH EVERY DAY 04/27/19   Hoyt Koch, MD  oxyCODONE (OXY IR/ROXICODONE) 5 MG immediate release tablet Take 1 tablet (5 mg total) by mouth every 4 (four) hours as needed for severe pain. 05/17/19   Janith Lima, MD  pravastatin (PRAVACHOL) 20 MG tablet TAKE 1 TABLET (20 MG TOTAL) BY MOUTH DAILY AT 6 PM. 04/27/19   Hoyt Koch, MD    Allergies    Codeine and Penicillins  Review of Systems   Review of Systems 10 Systems reviewed and are negative for acute change except as noted in the HPI.  Physical Exam Updated Vital Signs BP (!) 174/100   Pulse 99   Temp 98.1 F (36.7 C) (Oral)   Resp 20   SpO2 97%   Physical Exam Constitutional:      Comments: Patient is alert nontoxic and clinically well in appearance.  HENT:     Head: Normocephalic and atraumatic.     Right Ear: Tympanic membrane normal.     Left Ear: Tympanic membrane normal.     Nose: Nose normal.     Comments: Nasal passages are clear. There is no clot drainage or discharge present. No area of evident acute bleeding.  Posterior oropharynx is widely patent and clear. There is no blood or mucus streaking in the posterior oropharynx. His membranes are in good condition.    Mouth/Throat:     Mouth: Mucous membranes are moist.     Pharynx: Oropharynx is clear.  Eyes:     Extraocular Movements: Extraocular movements intact.     Conjunctiva/sclera: Conjunctivae normal.  Cardiovascular:     Comments: Heart sounds are regular at this time. Do not appreciate any significant rub murmur gallop. Pulmonary:     Effort: Pulmonary effort is normal.  Breath sounds: Normal breath sounds.  Abdominal:     General: There is no distension.     Palpations: Abdomen is soft.  Musculoskeletal:        General: Normal range of motion.     Cervical back: Neck supple.     Comments: Patient has skin thinning and hyperpigmentation of bilateral lower extremities consistent with chronic venous stasis. Left lower extremity has dry, nearly healed ulcers. At this time, all there is a chronic appearance and it does not appear to be acutely cellulitic. See attached images.  Skin:    General: Skin is warm and dry.  Neurological:     General: No focal deficit present.     Mental Status: She is oriented to person, place, and time.     Coordination: Coordination normal.  Psychiatric:        Mood and Affect: Mood normal.       ED Results / Procedures / Treatments   Labs (all labs ordered are listed, but only abnormal results are displayed) Labs Reviewed - No data to display  EKG None  Radiology No results found.  Procedures Procedures (including critical care time)  Medications Ordered in ED Medications  amLODipine (NORVASC) tablet 5 mg (5 mg Oral Given 05/24/19 1844)  hydrochlorothiazide (MICROZIDE) capsule 12.5 mg (12.5 mg Oral Given 05/24/19 1844)    ED Course  I have reviewed the triage vital signs and the nursing notes.  Pertinent labs & imaging results that were available during my care of the patient were  reviewed by me and considered in my medical decision making (see chart for details).    MDM Rules/Calculators/A&P                       Consult: Dr. Benjamine Mola was incidentally in the emergency department seeing another patient.  He was able to briefly examine the patient and agrees with discontinuing Eliquis at this time and he will see her for follow-up in the office.  No packing indicated at this time.  Patient was concerned that her nasal bleeding was being caused by interaction of cefdinir and Eliquis. I explained that certainly drug interactions are a concern but Eliquis by itself increases the risk of bleeding. Patient's chronic venous stasis appears to be controlled at this time. The appearance is chronic and I do not suspect acute cellulitis or wound infection that is active. Patient did not want to continue taking the cefdinir and I advised that she could discontinue it but needs to follow-up very soon at the wound care clinic to start evaluation for topical and mechanical management of venous stasis. I think this would be very beneficial to the patient in the long-term.  Patient blood pressure remained moderately elevated at 170s over 100 throughout her stay in the emergency department.  She does report having taken her hydrochlorothiazide this morning as prescribed.  No signs of encephalopathy or endorgan damage.  I will however add Norvasc 5 mg with instructions for close monitoring of blood pressure.  I also recommended follow-up with ENT for sporadic recurrent nosebleeds. Seen by Dr.Teoh.  I reviewed all of these findings and suggestions with the patient's son by phone conversation. Final Clinical Impression(s) / ED Diagnoses Final diagnoses:  Epistaxis  Chronic venous stasis  Essential hypertension    Rx / DC Orders ED Discharge Orders         Ordered    amLODipine (NORVASC) 5 MG tablet  Daily  05/24/19 1845           Charlesetta Shanks, MD 05/24/19 [id number]    Charlesetta Shanks, MD 05/24/19 [id number]

## 2019-05-24 NOTE — Telephone Encounter (Signed)
noted 

## 2019-05-24 NOTE — ED Notes (Signed)
Patient spitting out small amount of dark red blood in sink

## 2019-05-24 NOTE — ED Triage Notes (Signed)
Arrived by [facility] from home. Patient reports nosebleed X1 week. Patient states she is "not blood is not draining form nose, but can feel when swallowing". NAD

## 2019-05-24 NOTE — Discharge Instructions (Signed)
Schedule a follow-up appointment with the ear nose throat specialist listed above, Dr. Benjamine Mola. You may benefit from further evaluation for recurrent nosebleeds.  You were seen by Dr. Benjamine Mola in the emergency department, he will see you in follow-up at his office. Schedule appointment at the wound care clinic for ongoing treatment of chronic venous stasis. Stop taking your Eliquis for the next several days.  Call your doctor for recheck as soon as possible to discuss the risks benefits of restarting Eliquis once your bleeding has stopped. Your blood pressure was moderately elevated in the emergency department.  You reported you had already taken your morning medication and are due for your evening medication at this time.  A low dose of a second medication is being started.  Start Norvasc 5 mg daily and write down your blood pressures in a log twice a day.  Take this to your next doctor's appointment. You were given your evening dose of hydrochlorothiazide in the emergency department.  You are also given your dose of Norvasc.  You do not need additional doses of your blood pressure medicine tonight.

## 2019-05-24 NOTE — ED Notes (Signed)
No epistaxis at this time.

## 2019-05-24 NOTE — ED Notes (Signed)
ENT at bedside

## 2019-05-25 ENCOUNTER — Telehealth: Payer: Self-pay | Admitting: Internal Medicine

## 2019-05-25 NOTE — Telephone Encounter (Signed)
  Call from [facility] agent Patients son calling to report patient went to ED on 1/14 for nose bleeding. Patient was told in ED to stop Eliquis.  Calling today to report headache and "bloody drainage from sinus"  Please call

## 2019-05-25 NOTE — Telephone Encounter (Signed)
She was told to schedule with ENT for follow-up and given contact information for Dr. Benjamine Mola- they should make that appointment.   She has an active prescription for Cefdinir- not sure if she is still taking but this would help with a sinus infection; can also use some OTC saline spray to help moisturize the nasal passages.

## 2019-05-25 NOTE — Telephone Encounter (Signed)
Pt's son has been informed and expressed understanding.    

## 2019-05-28 ENCOUNTER — Telehealth: Payer: Self-pay

## 2019-05-28 DIAGNOSIS — R040 Epistaxis: Secondary | ICD-10-CM | POA: Diagnosis not present

## 2019-05-28 NOTE — Telephone Encounter (Signed)
Have already discussed follow up appt with ENT last week.   Copied from [place] [id number]. Topic: General - Other >> May 28, 2019 10:45 AM Yvette Rack wrote: Reason for CRM: Pt stated she was told by the hospital to go to the wound center and she would like to know if an appt is needed. Pt also requests call back.

## 2019-05-28 NOTE — Telephone Encounter (Signed)
Please advise.   Copied from [place] [id number]. Topic: General - Other >> May 28, 2019 10:45 AM Yvette Rack wrote: Reason for CRM: Pt stated she was told by the hospital to go to the wound center and she would like to know if an appt is needed. Pt also requests call back. >> May 28, 2019  1:55 PM Greggory Keen D wrote: Pt called back saying she has an appt today with the ENT.  She said she was at [facility] for elevated BP and bleeding out of her mouth from her sinuses.  They took her off a couple of her medications and she wants to know what she needs to do.  CB#  [phone]

## 2019-05-29 ENCOUNTER — Inpatient Hospital Stay (HOSPITAL_COMMUNITY)
Admission: EM | Admit: 2019-05-29 | Discharge: 2019-06-06 | DRG: 603 | Disposition: A | Discharge status: Skilled Nursing Facility | Payer: Medicare Other | Source: Ambulatory Visit | Attending: Internal Medicine | Admitting: Internal Medicine

## 2019-05-29 ENCOUNTER — Encounter (HOSPITAL_COMMUNITY): Payer: Self-pay | Admitting: Emergency Medicine

## 2019-05-29 ENCOUNTER — Ambulatory Visit (INDEPENDENT_AMBULATORY_CARE_PROVIDER_SITE_OTHER): Payer: Medicare Other | Admitting: Internal Medicine

## 2019-05-29 ENCOUNTER — Encounter: Payer: Self-pay | Admitting: Internal Medicine

## 2019-05-29 ENCOUNTER — Other Ambulatory Visit: Payer: Self-pay

## 2019-05-29 ENCOUNTER — Telehealth: Payer: Self-pay

## 2019-05-29 DIAGNOSIS — Z79899 Other long term (current) drug therapy: Secondary | ICD-10-CM

## 2019-05-29 DIAGNOSIS — L03115 Cellulitis of right lower limb: Secondary | ICD-10-CM

## 2019-05-29 DIAGNOSIS — G4730 Sleep apnea, unspecified: Secondary | ICD-10-CM | POA: Diagnosis present

## 2019-05-29 DIAGNOSIS — E785 Hyperlipidemia, unspecified: Secondary | ICD-10-CM | POA: Diagnosis present

## 2019-05-29 DIAGNOSIS — Z20822 Contact with and (suspected) exposure to covid-19: Secondary | ICD-10-CM | POA: Diagnosis present

## 2019-05-29 DIAGNOSIS — I10 Essential (primary) hypertension: Secondary | ICD-10-CM

## 2019-05-29 DIAGNOSIS — K2970 Gastritis, unspecified, without bleeding: Secondary | ICD-10-CM | POA: Diagnosis present

## 2019-05-29 DIAGNOSIS — Z800 Family history of malignant neoplasm of digestive organs: Secondary | ICD-10-CM

## 2019-05-29 DIAGNOSIS — K5730 Diverticulosis of large intestine without perforation or abscess without bleeding: Secondary | ICD-10-CM | POA: Diagnosis present

## 2019-05-29 DIAGNOSIS — I4821 Permanent atrial fibrillation: Secondary | ICD-10-CM | POA: Diagnosis not present

## 2019-05-29 DIAGNOSIS — Z880 Allergy status to penicillin: Secondary | ICD-10-CM

## 2019-05-29 DIAGNOSIS — K2990 Gastroduodenitis, unspecified, without bleeding: Secondary | ICD-10-CM | POA: Diagnosis present

## 2019-05-29 DIAGNOSIS — D638 Anemia in other chronic diseases classified elsewhere: Secondary | ICD-10-CM | POA: Diagnosis present

## 2019-05-29 DIAGNOSIS — Z85828 Personal history of other malignant neoplasm of skin: Secondary | ICD-10-CM

## 2019-05-29 DIAGNOSIS — Z03818 Encounter for observation for suspected exposure to other biological agents ruled out: Secondary | ICD-10-CM | POA: Diagnosis not present

## 2019-05-29 DIAGNOSIS — Z823 Family history of stroke: Secondary | ICD-10-CM

## 2019-05-29 DIAGNOSIS — R58 Hemorrhage, not elsewhere classified: Secondary | ICD-10-CM | POA: Insufficient documentation

## 2019-05-29 DIAGNOSIS — Z885 Allergy status to narcotic agent status: Secondary | ICD-10-CM

## 2019-05-29 DIAGNOSIS — G44319 Acute post-traumatic headache, not intractable: Secondary | ICD-10-CM

## 2019-05-29 DIAGNOSIS — Z8249 Family history of ischemic heart disease and other diseases of the circulatory system: Secondary | ICD-10-CM

## 2019-05-29 DIAGNOSIS — R040 Epistaxis: Secondary | ICD-10-CM | POA: Diagnosis present

## 2019-05-29 DIAGNOSIS — Z86711 Personal history of pulmonary embolism: Secondary | ICD-10-CM

## 2019-05-29 DIAGNOSIS — Z96653 Presence of artificial knee joint, bilateral: Secondary | ICD-10-CM | POA: Diagnosis present

## 2019-05-29 DIAGNOSIS — Z7901 Long term (current) use of anticoagulants: Secondary | ICD-10-CM

## 2019-05-29 DIAGNOSIS — L97911 Non-pressure chronic ulcer of unspecified part of right lower leg limited to breakdown of skin: Secondary | ICD-10-CM

## 2019-05-29 DIAGNOSIS — R600 Localized edema: Secondary | ICD-10-CM | POA: Diagnosis not present

## 2019-05-29 DIAGNOSIS — M1990 Unspecified osteoarthritis, unspecified site: Secondary | ICD-10-CM | POA: Diagnosis present

## 2019-05-29 DIAGNOSIS — Z8673 Personal history of transient ischemic attack (TIA), and cerebral infarction without residual deficits: Secondary | ICD-10-CM

## 2019-05-29 DIAGNOSIS — L03119 Cellulitis of unspecified part of limb: Secondary | ICD-10-CM | POA: Diagnosis present

## 2019-05-29 DIAGNOSIS — I4891 Unspecified atrial fibrillation: Secondary | ICD-10-CM | POA: Diagnosis present

## 2019-05-29 LAB — CBC WITH DIFFERENTIAL/PLATELET
Abs Immature Granulocytes: 0.03 10*3/uL (ref 0.00–0.07)
Basophils Absolute: 0.1 10*3/uL (ref 0.0–0.1)
Basophils Relative: 1 %
Eosinophils Absolute: 0.1 10*3/uL (ref 0.0–0.5)
Eosinophils Relative: 1 %
HCT: 37.5 % (ref 36.0–46.0)
Hemoglobin: 11.9 g/dL — ABNORMAL LOW (ref 12.0–15.0)
Immature Granulocytes: 0 %
Lymphocytes Relative: 25 %
Lymphs Abs: 2.4 10*3/uL (ref 0.7–4.0)
MCH: 28.6 pg (ref 26.0–34.0)
MCHC: 31.7 g/dL (ref 30.0–36.0)
MCV: 90.1 fL (ref 80.0–100.0)
Monocytes Absolute: 1.1 10*3/uL — ABNORMAL HIGH (ref 0.1–1.0)
Monocytes Relative: 11 %
Neutro Abs: 6 10*3/uL (ref 1.7–7.7)
Neutrophils Relative %: 62 %
Platelets: 240 10*3/uL (ref 150–400)
RBC: 4.16 MIL/uL (ref 3.87–5.11)
RDW: 13.5 % (ref 11.5–15.5)
WBC: 9.6 10*3/uL (ref 4.0–10.5)
nRBC: 0 % (ref 0.0–0.2)

## 2019-05-29 LAB — BASIC METABOLIC PANEL
Anion gap: 11 (ref 5–15)
BUN: 13 mg/dL (ref 8–23)
CO2: 24 mmol/L (ref 22–32)
Calcium: 9.8 mg/dL (ref 8.9–10.3)
Chloride: 102 mmol/L (ref 98–111)
Creatinine, Ser: 0.65 mg/dL (ref 0.44–1.00)
GFR calc Af Amer: >60 mL/min (ref >60)
GFR calc non Af Amer: >60 mL/min (ref >60)
Glucose, Bld: 117 mg/dL — ABNORMAL HIGH (ref 70–99)
Potassium: 3.9 mmol/L (ref 3.5–5.1)
Sodium: 137 mmol/L (ref 135–145)

## 2019-05-29 MED ORDER — DOXYCYCLINE HYCLATE 100 MG PO TABS: 100.0000 mg | ORAL_TABLET | Freq: Two times a day (BID) | ORAL | 0 refills | Status: DC

## 2019-05-29 MED ORDER — MUPIROCIN 2 % EX OINT: 1.0000 "application " | TOPICAL_OINTMENT | Freq: Two times a day (BID) | CUTANEOUS | 0 refills | Status: DC

## 2019-05-29 NOTE — Assessment & Plan Note (Signed)
Blood pressure very acceptable here today It was elevated when she was in the emergency room last, which is likely related to anxiety and pain She is not currently taking the amlodipine and advised her to hold that Continue hydrochlorothiazide 25 mg daily and monitor Follow-up with PCP

## 2019-05-29 NOTE — ED Triage Notes (Signed)
Pt to triage via [facility] from PCP office.  Pt spitting up blood from posterior nosebleed.  Seen at [facility] on 1/14 for nosebleed, yesterday at ENT, and again today at PCP.  Pt stopped Eliquis on 1/14.  Pt also c/o cellulitis to RLE that she is taking antibiotics for.

## 2019-05-29 NOTE — Telephone Encounter (Signed)
I would recommend follow up visit to assess what is needed.

## 2019-05-29 NOTE — Assessment & Plan Note (Signed)
Acute cellulitis right lower extremity on top of chronic venous stasis changes Her right lower extremity is very tender, warm and it is slightly more erythematous than usual so I do think she has active cellulitis She does need to be started on antibiotics-oral and topical Has appointment with wound care 2/2 Pain management with oxycodone-discussed side effects of confusion, constipation

## 2019-05-29 NOTE — Progress Notes (Signed)
Subjective:    Patient ID: Samantha Bishop, female    DOB: 1924-06-22, 86 y.o.   MRN: [id number]  HPI The patient is here for an acute visit.  She is here with a friend, who helps supplement the history.  05/17/2019: Seen in our office by Dr. Ronnald Ramp for increased pain in the right lower leg for 1 week associated with redness and swelling.  She had a scab that had come off of the lower leg.  She was diagnosed with cellulitis and chronic venous stasis.  She was started on cefdinir, Vasculera and oxycodone for the pain.  05/24/2019: She went to the ED for epistaxis.  She had head intermittent nosebleeds for 3 days prior to going to the emergency room.  She did not have any bleeding in the emergency room or earlier that day.  Nasal passageways were clear.  Her blood pressure was very high.  She was started on amlodipine 5 mg daily.  It was recommended that she stop the antibiotic since the leg for the patient at that time was better and they did not feel that she had active cellulitis.  Her Eliquis was stopped and she saw Dr. Benjamine Mola yesterday and had a right nasal vessel cauterized.  She did not have any bleeding between the emergency room and yesterday when she saw ENT.  She comes in today with complaints of right lower leg pain.  She is unsure if the antibiotic that she took for the 7 days helped or not.  In the emergency room she did state that it looked better.  She is taking the oxycodone and it is the only thing that helps the pain is severe.  Today she did notice slight discharge from the wound on the medial aspect of her ankle.  She denies any fevers or chills.  She has not had any nasal bleeds up until now, but at the end of her visit she started bleeding.  The bleeding came out of her mouth.  There is no active bleeding in either nasal passageway.  It is difficult to tell where the blood was coming from.  She did have evidence of blood in her mouth and she was spitting it out, but it was not obvious  it was draining from above or below.  She feels like she feels it accumulating in her sinus region.  She has not been taking the amlodipine or Vasculera.  She has not taken the Eliquis since her emergency room visit, but is anxious being off of it because of her risk of stroke.  She last took an oxycodone this morning.   She has an appointment with wound care 2/2.  Medications and allergies reviewed with patient and updated if appropriate.  Patient Active Problem List   Diagnosis Date Noted  . Venous stasis ulcer limited to breakdown of skin without varicose veins ([place]) 05/17/2019  . Cellulitis of right lower extremity 05/17/2019  . Ulcer of right lower extremity, limited to breakdown of skin ([place]) 05/17/2019  . Acute post-traumatic headache, not intractable 12/21/2018  . Hot flashes 02/09/2017  . Adjustment disorder with mixed anxiety and depressed mood 06/28/2016  . CVA (cerebral vascular accident) ([place]) 06/26/2016  . Right homonymous hemianopsia 06/26/2016  . Back pain 04/08/2015  . Abnormal CT scan 04/08/2015  . Constipation 01/02/2015  . Intracranial aneurysm 10/18/2014  . Routine general medical examination at a health care facility 03/08/2014  . Occipital neuralgia 07/16/2013  . Venous insufficiency 07/11/2013  . Alopecia  06/08/2012  . Osteopenia 02/21/2010  . ANXIETY 05/21/2009  . Essential hypertension 05/21/2009  . Atrial fibrillation - followed by Dr. Caryl Comes 03/03/2009  . INTRACRANIAL ANEURYSM 08/16/2007  . Hyperlipidemia 08/15/2007  . GERD 08/15/2007    Current Outpatient Medications on File Prior to Visit  Medication Sig Dispense Refill  . amLODipine (NORVASC) 5 MG tablet Take 1 tablet (5 mg total) by mouth daily. 30 tablet 0  . apixaban (ELIQUIS) 5 MG TABS tablet TAKE 1 TABLET (5 MG TOTAL) BY MOUTH 2 (TWO) TIMES DAILY. 180 tablet 1  . Dietary Management Product (VASCULERA) TABS Take 1 capsule by mouth daily. 30 tablet 11  . fluticasone (FLONASE) 50 MCG/ACT nasal  spray Place 2 sprays into both nostrils daily. 48 g 3  . gabapentin (NEURONTIN) 100 MG capsule TAKE 3-4 CAPSULES (300-400 MG TOTAL) BY MOUTH 2 (TWO) TIMES DAILY. 720 capsule 1  . hydrochlorothiazide (HYDRODIURIL) 25 MG tablet TAKE 1/2 TABLET BY MOUTH EVERY DAY 45 tablet 1  . oxyCODONE (OXY IR/ROXICODONE) 5 MG immediate release tablet Take 1 tablet (5 mg total) by mouth every 4 (four) hours as needed for severe pain. 30 tablet 0  . pravastatin (PRAVACHOL) 20 MG tablet TAKE 1 TABLET (20 MG TOTAL) BY MOUTH DAILY AT 6 PM. 90 tablet 1   No current facility-administered medications on file prior to visit.    Past Medical History:  Diagnosis Date  . Anxiety state, unspecified   . Calculus of kidney   . Cerebral aneurysm, nonruptured   . Colon polyp   . Complication of anesthesia    "my heart stopped" 2004 knee  surg - see note on chart  . DEEP VENOUS THROMBOPHLEBITIS 08/15/2007   Qualifier: History of  By: Lenna Gilford MD, Deborra Medina   . Diverticulosis of colon (without mention of hemorrhage)   . History of skin cancer   . HTN (hypertension)   . Hyperlipidemia   . Lumbago   . Osteoarthrosis, unspecified whether generalized or localized, unspecified site   . Permanent atrial fibrillation ([place])   . Pulmonary emboli ([place])    following remote knee arthroscopy surgery  . Ruptured lumbar disc   . Sleep apnea    stop bang score 4   . Unspecified hemorrhoids without mention of complication     Past Surgical History:  Procedure Laterality Date  . ABDOMINAL HYSTERECTOMY    . APPENDECTOMY    . BLOCKED INTESTINE SURGERY    . BREAST CYST EXCISION    . HEMHORROIDECTOMY    . JOINT REPLACEMENT  2004   RT TOTAL KNEE  . KNEE ARTHROSCOPY     X2 L KNEE  . KNEE SURGERY    . NASAL SURGERY X2    . TONSILLECTOMY AND ADENOIDECTOMY    . TOTAL KNEE ARTHROPLASTY  09/03/2011   Procedure: TOTAL KNEE ARTHROPLASTY;  Surgeon: Gearlean Alf, MD;  Location: [facility];  Service: Orthopedics;  Laterality: Left;    Social  History   Socioeconomic History  . Marital status: Single    Spouse name: Not on file  . Number of children: 3  . Years of education: Not on file  . Highest education level: Not on file  Occupational History  . Occupation: Retired  Tobacco Use  . Smoking status: Never Smoker  . Smokeless tobacco: Never Used  Substance and Sexual Activity  . Alcohol use: Yes    Alcohol/week: 0.0 standard drinks    Comment: Rarely  . Drug use: No  . Sexual activity: Not  Currently  Other Topics Concern  . Not on file  Social History Narrative  . Not on file   Social Determinants of Health   Financial Resource Strain:   . Difficulty of Paying Living Expenses: Not on file  Food Insecurity:   . Worried About Charity fundraiser in the Last Year: Not on file  . Ran Out of Food in the Last Year: Not on file  Transportation Needs:   . Lack of Transportation (Medical): Not on file  . Lack of Transportation (Non-Medical): Not on file  Physical Activity:   . Days of Exercise per Week: Not on file  . Minutes of Exercise per Session: Not on file  Stress:   . Feeling of Stress : Not on file  Social Connections:   . Frequency of Communication with Friends and Family: Not on file  . Frequency of Social Gatherings with Friends and Family: Not on file  . Attends Religious Services: Not on file  . Active Member of Clubs or Organizations: Not on file  . Attends Archivist Meetings: Not on file  . Marital Status: Not on file    Family History  Problem Relation Age of Onset  . Coronary artery disease Father   . Stroke Father   . Cirrhosis Brother   . Colon cancer Son   . Colon polyps Neg Hx   . Kidney disease Neg Hx   . Diabetes Neg Hx     Review of Systems  Constitutional: Negative for chills and fever.  HENT: Positive for nosebleeds.   Respiratory: Negative for cough, shortness of breath and wheezing.   Cardiovascular: Positive for leg swelling. Negative for chest pain and  palpitations.  Gastrointestinal: Negative for constipation.  Skin: Positive for color change and wound.  Neurological: Positive for light-headedness. Negative for headaches.  Psychiatric/Behavioral: Negative for confusion.       Objective:   Vitals:   05/29/19 1525  BP: (!) 146/84  Pulse: 88  Temp: 98.6 F (37 C)  SpO2: 98%   BP Readings from Last 3 Encounters:  05/29/19 (!) 146/84  05/24/19 (!) 174/100  05/17/19 136/78   Wt Readings from Last 3 Encounters:  05/29/19 179 lb (81.2 kg)  05/17/19 179 lb (81.2 kg)  12/21/18 172 lb (78 kg)   Body mass index is 28.46 kg/m.   Physical Exam Constitutional:      General: She is not in acute distress.    Appearance: Normal appearance. She is not ill-appearing.  HENT:     Head: Normocephalic and atraumatic.     Nose:     Comments: No active bleeding from either nasal passageway.  No blood in either nasal passageway.    Mouth/Throat:     Comments: At the end of the visit she started spitting up bright red blood.  It what is mixed with saliva.  There appeared to be some clots.  Mouth had visible blood throughout it.  There is no obvious drainage from her sinus area.  There is no coughing or gagging Musculoskeletal:     Comments: Right lower extremity with acute on chronic venous stasis-right lower extremity appears to be chronically larger than left.  Lower leg with chronic erythema that may be slightly more than usual per patient.  There is increased warmth.  To chronic ulcerations right medial ankle and right anterior lower leg.  Slight yellowish discharge from medial ankle wound.  No pus or bleeding.  Lower leg very tender to palpation. Left  lower extremity with chronic venous stasis changes-nontender, no warmth  Skin:    General: Skin is warm and dry.  Neurological:     General: No focal deficit present.     Mental Status: She is alert.  Psychiatric:        Mood and Affect: Mood normal.            Assessment & Plan:     See Problem List for Assessment and Plan of chronic medical problems.    This visit occurred during the SARS-CoV-2 public health emergency.  Safety protocols were in place, including screening questions prior to the visit, additional usage of staff PPE, and extensive cleaning of exam room while observing appropriate contact time as indicated for disinfecting solutions.

## 2019-05-29 NOTE — Patient Instructions (Addendum)
Active bleed - blood is coming from mouth - no visible blood in nose.  She feels the bleeding is from the upper nasal / sinus region, but is spitting up blood.  S/p nasal cauterization yesterday, Dr Benjamine Mola.  ? Nasal bleed or not.   Right lower leg cellulitis and pain - needs antibiotics.   Has been off eliquis since last ED visit.

## 2019-05-29 NOTE — Telephone Encounter (Signed)
Copied from [place] [id number]. Topic: Appointment Scheduling - Scheduling Inquiry for Clinic >> May 29, 2019 10:33 AM Parke Poisson wrote: Reason for CRM: Pt's son Samantha Bishop called stating that pt is having a lot of leg pain and would like to be seen by Dr Sharlet Salina.I was not able to locate an appointment anytime soon.He also wanted to let you know pt was seen in hospital and was told to quit taking apixaban (ELIQUIS) 5 MG TABS tablet for a week. He want sto have tyour opinion on this   F/u   Called son Samantha Bishop back with an office visit with Dr. Quay Burow today @ 3:45pm  Son Samantha Bishop aware Dr. Sharlet Salina is not in the office today

## 2019-05-29 NOTE — ED Provider Notes (Signed)
[place] EMERGENCY DEPARTMENT Provider Note   CSN: [id number] Arrival date & time: 05/29/19  1729     History Chief Complaint  Patient presents with  . Epistaxis    CHARITA VONGPHACHANH is a 86 y.o. female.  Patient referred to the emergency department by her primary care doctor for nosebleed.  Patient was seen in the ER at [facility] on January 14 for same.  At that time she had persistent bleeding into her posterior oropharynx.  Source was not identified.  Patient was seen in the ER by ENT and then followed up in the office.  She reportedly was seen yesterday and had some type of cauterization procedure performed.  When she was at her doctor's office today for follow-up for cellulitis of her leg, she started having bleeding again.  She denies any bleeding from the nostrils, all bleeding is into the back of her throat.  She does take Eliquis because of a history of atrial fibrillation, however it was stopped on the 14th.  Patient reports that she has been on antibiotics for more than a week for pain, redness and swelling of her right lower leg.  She reports persistent pain and swelling, has not seen improvement with antibiotics.        Past Medical History:  Diagnosis Date  . Anxiety state, unspecified   . Calculus of kidney   . Cerebral aneurysm, nonruptured   . Colon polyp   . Complication of anesthesia    "my heart stopped" 2004 knee  surg - see note on chart  . DEEP VENOUS THROMBOPHLEBITIS 08/15/2007   Qualifier: History of  By: Lenna Gilford MD, Deborra Medina   . Diverticulosis of colon (without mention of hemorrhage)   . History of skin cancer   . HTN (hypertension)   . Hyperlipidemia   . Lumbago   . Osteoarthrosis, unspecified whether generalized or localized, unspecified site   . Permanent atrial fibrillation ([place])   . Pulmonary emboli ([place])    following remote knee arthroscopy surgery  . Ruptured lumbar disc   . Sleep apnea    stop bang score 4   .  Unspecified hemorrhoids without mention of complication     Patient Active Problem List   Diagnosis Date Noted  . Bleeding 05/29/2019  . Venous stasis ulcer limited to breakdown of skin without varicose veins ([place]) 05/17/2019  . Cellulitis of right lower extremity 05/17/2019  . Ulcer of right lower extremity, limited to breakdown of skin ([place]) 05/17/2019  . Acute post-traumatic headache, not intractable 12/21/2018  . Hot flashes 02/09/2017  . Adjustment disorder with mixed anxiety and depressed mood 06/28/2016  . CVA (cerebral vascular accident) ([place]) 06/26/2016  . Right homonymous hemianopsia 06/26/2016  . Back pain 04/08/2015  . Abnormal CT scan 04/08/2015  . Constipation 01/02/2015  . Intracranial aneurysm 10/18/2014  . Routine general medical examination at a health care facility 03/08/2014  . Occipital neuralgia 07/16/2013  . Venous insufficiency 07/11/2013  . Alopecia 06/08/2012  . Osteopenia 02/21/2010  . ANXIETY 05/21/2009  . Essential hypertension 05/21/2009  . Atrial fibrillation - followed by Dr. Caryl Comes 03/03/2009  . INTRACRANIAL ANEURYSM 08/16/2007  . Hyperlipidemia 08/15/2007  . GERD 08/15/2007    Past Surgical History:  Procedure Laterality Date  . ABDOMINAL HYSTERECTOMY    . APPENDECTOMY    . BLOCKED INTESTINE SURGERY    . BREAST CYST EXCISION    . HEMHORROIDECTOMY    . JOINT REPLACEMENT  2004   RT TOTAL  KNEE  . KNEE ARTHROSCOPY     X2 L KNEE  . KNEE SURGERY    . NASAL SURGERY X2    . TONSILLECTOMY AND ADENOIDECTOMY    . TOTAL KNEE ARTHROPLASTY  09/03/2011   Procedure: TOTAL KNEE ARTHROPLASTY;  Surgeon: Gearlean Alf, MD;  Location: [facility];  Service: Orthopedics;  Laterality: Left;     OB History   No obstetric history on file.     Family History  Problem Relation Age of Onset  . Coronary artery disease Father   . Stroke Father   . Cirrhosis Brother   . Colon cancer Son   . Colon polyps Neg Hx   . Kidney disease Neg Hx   . Diabetes Neg Hx       Social History   Tobacco Use  . Smoking status: Never Smoker  . Smokeless tobacco: Never Used  Substance Use Topics  . Alcohol use: Yes    Alcohol/week: 0.0 standard drinks    Comment: Rarely  . Drug use: No    Home Medications Prior to Admission medications   Medication Sig Start Date End Date Taking? Authorizing Provider  apixaban (ELIQUIS) 5 MG TABS tablet TAKE 1 TABLET (5 MG TOTAL) BY MOUTH 2 (TWO) TIMES DAILY. 10/19/18   Hoyt Koch, MD  fluticasone Asencion Islam) 50 MCG/ACT nasal spray Place 2 sprays into both nostrils daily. 02/10/18   Hoyt Koch, MD  gabapentin (NEURONTIN) 100 MG capsule TAKE 3-4 CAPSULES (300-400 MG TOTAL) BY MOUTH 2 (TWO) TIMES DAILY. 09/21/18   Hoyt Koch, MD  hydrochlorothiazide (HYDRODIURIL) 25 MG tablet TAKE 1/2 TABLET BY MOUTH EVERY DAY 04/27/19   Hoyt Koch, MD  oxyCODONE (OXY IR/ROXICODONE) 5 MG immediate release tablet Take 1 tablet (5 mg total) by mouth every 4 (four) hours as needed for severe pain. 05/17/19   Janith Lima, MD  pravastatin (PRAVACHOL) 20 MG tablet TAKE 1 TABLET (20 MG TOTAL) BY MOUTH DAILY AT 6 PM. 04/27/19   Hoyt Koch, MD    Allergies    Codeine and Penicillins  Review of Systems   Review of Systems  HENT: Positive for nosebleeds.   Skin: Positive for color change.  All other systems reviewed and are negative.   Physical Exam Updated Vital Signs BP (!) 121/56   Pulse 80   Temp 99.4 F (37.4 C) (Oral)   Resp 16   SpO2 96%   Physical Exam Vitals and nursing note reviewed.  Constitutional:      General: She is not in acute distress.    Appearance: Normal appearance. She is well-developed.  HENT:     Head: Normocephalic and atraumatic.     Comments: No active nasal bleeding but dried blood noted at lips and small amount of brownish blood seen in posterior oropharynx    Right Ear: Hearing normal.     Left Ear: Hearing normal.     Nose: Nose normal.  Eyes:      Conjunctiva/sclera: Conjunctivae normal.     Pupils: Pupils are equal, round, and reactive to light.  Cardiovascular:     Rate and Rhythm: Regular rhythm.     Heart sounds: S1 normal and S2 normal. No murmur. No friction rub. No gallop.   Pulmonary:     Effort: Pulmonary effort is normal. No respiratory distress.     Breath sounds: Normal breath sounds.  Chest:     Chest wall: No tenderness.  Abdominal:     General: Bowel  sounds are normal.     Palpations: Abdomen is soft.     Tenderness: There is no abdominal tenderness. There is no guarding or rebound. Negative signs include Murphy's sign and McBurney's sign.     Hernia: No hernia is present.  Musculoskeletal:        General: Normal range of motion.     Cervical back: Normal range of motion and neck supple.  Skin:    General: Skin is warm and dry.     Findings: No rash.  Neurological:     Mental Status: She is alert and oriented to person, place, and time.     GCS: GCS eye subscore is 4. GCS verbal subscore is 5. GCS motor subscore is 6.     Cranial Nerves: No cranial nerve deficit.     Sensory: No sensory deficit.     Coordination: Coordination normal.  Psychiatric:        Speech: Speech normal.        Behavior: Behavior normal.        Thought Content: Thought content normal.     ED Results / Procedures / Treatments   Labs (all labs ordered are listed, but only abnormal results are displayed) Labs Reviewed  CBC WITH DIFFERENTIAL/PLATELET - Abnormal; Notable for the following components:      Result Value   Hemoglobin 11.9 (*)    Monocytes Absolute 1.1 (*)    All other components within normal limits  BASIC METABOLIC PANEL - Abnormal; Notable for the following components:   Glucose, Bld 117 (*)    All other components within normal limits  CULTURE, BLOOD (ROUTINE X 2)  CULTURE, BLOOD (ROUTINE X 2)  SARS CORONAVIRUS 2 (TAT 6-24 HRS)  LACTIC ACID, PLASMA    EKG None  Radiology DG Tibia/Fibula Right  Result  Date: 05/30/2019 CLINICAL DATA:  Infection. Cellulitis. EXAM: RIGHT TIBIA AND FIBULA - 2 VIEW COMPARISON:  May 29, 2007 FINDINGS: The patient is status post total knee arthroplasty. There is nonspecific subcutaneous edema involving the right lower extremity. Multiple calcifications are noted in the subcutaneous soft tissues which may related to chronic venous insufficiency. There is no radiographic evidence for osteomyelitis. There is no acute displaced fracture. There are no pockets of subcutaneous gas. IMPRESSION: 1. No radiographic evidence for osteomyelitis. 2. Nonspecific subcutaneous edema involving the right lower extremity. This can be seen in patients with cellulitis in the appropriate clinical setting. 3. Soft tissue calcifications, likely related to chronic venous insufficiency. Electronically Signed   By: Constance Holster M.D.   On: 05/30/2019 00:55    Procedures Procedures (including critical care time)  Medications Ordered in ED Medications - No data to display  ED Course  I have reviewed the triage vital signs and the nursing notes.  Pertinent labs & imaging results that were available during my care of the patient were reviewed by me and considered in my medical decision making (see chart for details).    MDM Rules/Calculators/A&P                      Patient presents to the emergency department for evaluation of epistaxis.  Patient seen in the ED 3 days ago with similar.  She had an ENT consultation in the ED and then followed up yesterday at the office with Dr. Benjamine Mola.  He performed cauterization in the office.  She had been doing well until this afternoon when she started having bleeding again.  Patient with dried blood  around her mouth and blood in her posterior oropharynx.  She is continually spitting up blood but there is no bleeding out of the naris.  This is concerning for posterior bleed.  She is hemodynamically stable.  She does take Eliquis but has not taken it since  she was seen in the ED on the 14th.  Patient also has been having problems with ongoing right lower extremity cellulitis.  She has been on Omnicef since January 7 for this.  Reviewing images in the chart from her previous visit, erythema, swelling appears to be worse.  This is a failure of outpatient therapy.  Patient will therefore need to be admitted for ongoing treatment of right lower extremity cellulitis.  She will need to have her hemoglobin hematocrit followed and trended because of ongoing bleeding.  I did discuss her care with Dr. Benjamine Mola, he will see her in consultation in the morning. Final Clinical Impression(s) / ED Diagnoses Final diagnoses:  Epistaxis  Cellulitis of right lower extremity    Rx / DC Orders ED Discharge Orders    None       Wilene Pharo, Gwenyth Allegra, MD 05/30/19 [phone]

## 2019-05-29 NOTE — Assessment & Plan Note (Addendum)
She is active bleeding here in the office-the blood is coming from her mouth, but she feels it is coming from her upper nasal passageway or sinus region.  There is no visible blood in either nasal passageway.  She did have a nasal cauterization yesterday, but I am not convinced this is a nasal bleed.  Possible sinus bleed or higher up in that region.  Seems to be less likely coming from her lungs or esophagus because she does feel it draining from above and there is no cough and she has no nausea and is not vomiting Needs further imaging/evaluation/scope to evaluate area of imaging She has been off the Eliquis since her last ED visit 1/14 Because of the active bleeding and she lives alone she will be taken to the emergency room for further evaluation  Advised not to take vasculera due to interaction with eliquis

## 2019-05-30 ENCOUNTER — Inpatient Hospital Stay (HOSPITAL_COMMUNITY): Payer: Medicare Other | Admitting: Certified Registered"

## 2019-05-30 ENCOUNTER — Encounter (HOSPITAL_COMMUNITY)
Admission: EM | Disposition: A | Discharge status: Skilled Nursing Facility | Payer: Self-pay | Source: Ambulatory Visit | Attending: Internal Medicine

## 2019-05-30 ENCOUNTER — Encounter (HOSPITAL_COMMUNITY): Payer: Self-pay | Admitting: Internal Medicine

## 2019-05-30 ENCOUNTER — Emergency Department (HOSPITAL_COMMUNITY): Payer: Medicare Other

## 2019-05-30 DIAGNOSIS — R040 Epistaxis: Secondary | ICD-10-CM | POA: Diagnosis not present

## 2019-05-30 DIAGNOSIS — I480 Paroxysmal atrial fibrillation: Secondary | ICD-10-CM

## 2019-05-30 DIAGNOSIS — R2681 Unsteadiness on feet: Secondary | ICD-10-CM | POA: Diagnosis not present

## 2019-05-30 DIAGNOSIS — D638 Anemia in other chronic diseases classified elsewhere: Secondary | ICD-10-CM | POA: Diagnosis present

## 2019-05-30 DIAGNOSIS — Z8673 Personal history of transient ischemic attack (TIA), and cerebral infarction without residual deficits: Secondary | ICD-10-CM | POA: Diagnosis not present

## 2019-05-30 DIAGNOSIS — I69398 Other sequelae of cerebral infarction: Secondary | ICD-10-CM | POA: Diagnosis not present

## 2019-05-30 DIAGNOSIS — K922 Gastrointestinal hemorrhage, unspecified: Secondary | ICD-10-CM | POA: Diagnosis not present

## 2019-05-30 DIAGNOSIS — M2550 Pain in unspecified joint: Secondary | ICD-10-CM | POA: Diagnosis not present

## 2019-05-30 DIAGNOSIS — I4821 Permanent atrial fibrillation: Secondary | ICD-10-CM | POA: Diagnosis not present

## 2019-05-30 DIAGNOSIS — K3189 Other diseases of stomach and duodenum: Secondary | ICD-10-CM

## 2019-05-30 DIAGNOSIS — R600 Localized edema: Secondary | ICD-10-CM | POA: Diagnosis not present

## 2019-05-30 DIAGNOSIS — R041 Hemorrhage from throat: Secondary | ICD-10-CM | POA: Diagnosis not present

## 2019-05-30 DIAGNOSIS — L03115 Cellulitis of right lower limb: Secondary | ICD-10-CM | POA: Diagnosis not present

## 2019-05-30 DIAGNOSIS — Z79899 Other long term (current) drug therapy: Secondary | ICD-10-CM | POA: Diagnosis not present

## 2019-05-30 DIAGNOSIS — Z85828 Personal history of other malignant neoplasm of skin: Secondary | ICD-10-CM | POA: Diagnosis not present

## 2019-05-30 DIAGNOSIS — Z885 Allergy status to narcotic agent status: Secondary | ICD-10-CM | POA: Diagnosis not present

## 2019-05-30 DIAGNOSIS — D649 Anemia, unspecified: Secondary | ICD-10-CM | POA: Diagnosis not present

## 2019-05-30 DIAGNOSIS — Z823 Family history of stroke: Secondary | ICD-10-CM | POA: Diagnosis not present

## 2019-05-30 DIAGNOSIS — Z800 Family history of malignant neoplasm of digestive organs: Secondary | ICD-10-CM | POA: Diagnosis not present

## 2019-05-30 DIAGNOSIS — I10 Essential (primary) hypertension: Secondary | ICD-10-CM | POA: Diagnosis not present

## 2019-05-30 DIAGNOSIS — I4891 Unspecified atrial fibrillation: Secondary | ICD-10-CM | POA: Diagnosis not present

## 2019-05-30 DIAGNOSIS — M6281 Muscle weakness (generalized): Secondary | ICD-10-CM | POA: Diagnosis not present

## 2019-05-30 DIAGNOSIS — K5730 Diverticulosis of large intestine without perforation or abscess without bleeding: Secondary | ICD-10-CM | POA: Diagnosis not present

## 2019-05-30 DIAGNOSIS — K2990 Gastroduodenitis, unspecified, without bleeding: Secondary | ICD-10-CM | POA: Diagnosis present

## 2019-05-30 DIAGNOSIS — L0390 Cellulitis, unspecified: Secondary | ICD-10-CM | POA: Diagnosis not present

## 2019-05-30 DIAGNOSIS — L03119 Cellulitis of unspecified part of limb: Secondary | ICD-10-CM | POA: Diagnosis present

## 2019-05-30 DIAGNOSIS — J341 Cyst and mucocele of nose and nasal sinus: Secondary | ICD-10-CM | POA: Diagnosis not present

## 2019-05-30 DIAGNOSIS — E785 Hyperlipidemia, unspecified: Secondary | ICD-10-CM | POA: Diagnosis not present

## 2019-05-30 DIAGNOSIS — Z8249 Family history of ischemic heart disease and other diseases of the circulatory system: Secondary | ICD-10-CM | POA: Diagnosis not present

## 2019-05-30 DIAGNOSIS — Z96653 Presence of artificial knee joint, bilateral: Secondary | ICD-10-CM | POA: Diagnosis present

## 2019-05-30 DIAGNOSIS — I69828 Other speech and language deficits following other cerebrovascular disease: Secondary | ICD-10-CM | POA: Diagnosis not present

## 2019-05-30 DIAGNOSIS — Z880 Allergy status to penicillin: Secondary | ICD-10-CM | POA: Diagnosis not present

## 2019-05-30 DIAGNOSIS — M1990 Unspecified osteoarthritis, unspecified site: Secondary | ICD-10-CM | POA: Diagnosis not present

## 2019-05-30 DIAGNOSIS — R41841 Cognitive communication deficit: Secondary | ICD-10-CM | POA: Diagnosis not present

## 2019-05-30 DIAGNOSIS — Z7401 Bed confinement status: Secondary | ICD-10-CM | POA: Diagnosis not present

## 2019-05-30 DIAGNOSIS — M545 Low back pain: Secondary | ICD-10-CM | POA: Diagnosis not present

## 2019-05-30 DIAGNOSIS — Z86711 Personal history of pulmonary embolism: Secondary | ICD-10-CM | POA: Diagnosis not present

## 2019-05-30 DIAGNOSIS — G4730 Sleep apnea, unspecified: Secondary | ICD-10-CM | POA: Diagnosis not present

## 2019-05-30 DIAGNOSIS — Z7901 Long term (current) use of anticoagulants: Secondary | ICD-10-CM | POA: Diagnosis not present

## 2019-05-30 DIAGNOSIS — Z20822 Contact with and (suspected) exposure to covid-19: Secondary | ICD-10-CM | POA: Diagnosis not present

## 2019-05-30 DIAGNOSIS — K2970 Gastritis, unspecified, without bleeding: Secondary | ICD-10-CM | POA: Diagnosis present

## 2019-05-30 HISTORY — PX: ESOPHAGOGASTRODUODENOSCOPY (EGD) WITH PROPOFOL: SHX5813

## 2019-05-30 LAB — CBC WITH DIFFERENTIAL/PLATELET
Abs Immature Granulocytes: 0.03 10*3/uL (ref 0.00–0.07)
Basophils Absolute: 0.1 10*3/uL (ref 0.0–0.1)
Basophils Relative: 1 %
Eosinophils Absolute: 0.1 10*3/uL (ref 0.0–0.5)
Eosinophils Relative: 1 %
HCT: 34.5 % — ABNORMAL LOW (ref 36.0–46.0)
Hemoglobin: 11.3 g/dL — ABNORMAL LOW (ref 12.0–15.0)
Immature Granulocytes: 0 %
Lymphocytes Relative: 21 %
Lymphs Abs: 1.7 10*3/uL (ref 0.7–4.0)
MCH: 29 pg (ref 26.0–34.0)
MCHC: 32.8 g/dL (ref 30.0–36.0)
MCV: 88.7 fL (ref 80.0–100.0)
Monocytes Absolute: 0.9 10*3/uL (ref 0.1–1.0)
Monocytes Relative: 12 %
Neutro Abs: 5.1 10*3/uL (ref 1.7–7.7)
Neutrophils Relative %: 65 %
Platelets: 218 10*3/uL (ref 150–400)
RBC: 3.89 MIL/uL (ref 3.87–5.11)
RDW: 13.5 % (ref 11.5–15.5)
WBC: 7.8 10*3/uL (ref 4.0–10.5)
nRBC: 0 % (ref 0.0–0.2)

## 2019-05-30 LAB — TYPE AND SCREEN
ABO/RH(D): A POS
Antibody Screen: NEGATIVE

## 2019-05-30 LAB — LACTIC ACID, PLASMA: Lactic Acid, Venous: 0.8 mmol/L (ref 0.5–1.9)

## 2019-05-30 LAB — BASIC METABOLIC PANEL
Anion gap: 11 (ref 5–15)
BUN: 11 mg/dL (ref 8–23)
CO2: 24 mmol/L (ref 22–32)
Calcium: 9.1 mg/dL (ref 8.9–10.3)
Chloride: 103 mmol/L (ref 98–111)
Creatinine, Ser: 0.6 mg/dL (ref 0.44–1.00)
GFR calc Af Amer: >60 mL/min (ref >60)
GFR calc non Af Amer: >60 mL/min (ref >60)
Glucose, Bld: 113 mg/dL — ABNORMAL HIGH (ref 70–99)
Potassium: 3.6 mmol/L (ref 3.5–5.1)
Sodium: 138 mmol/L (ref 135–145)

## 2019-05-30 LAB — SARS CORONAVIRUS 2 (TAT 6-24 HRS): SARS Coronavirus 2: NEGATIVE

## 2019-05-30 LAB — ABO/RH: ABO/RH(D): A POS

## 2019-05-30 SURGERY — ESOPHAGOGASTRODUODENOSCOPY (EGD) WITH PROPOFOL
Anesthesia: Monitor Anesthesia Care

## 2019-05-30 MED ORDER — SODIUM CHLORIDE 0.9 % IV SOLN
2.0000 g | Freq: Two times a day (BID) | INTRAVENOUS | Status: DC
  Administered 2019-05-30 – 2019-06-02 (×8): 2 g via INTRAVENOUS
  Filled 2019-05-30 (×8): qty 2

## 2019-05-30 MED ORDER — HYDRALAZINE HCL 20 MG/ML IJ SOLN: 10.0000 mg | INTRAMUSCULAR | Status: DC | PRN

## 2019-05-30 MED ORDER — ACETAMINOPHEN 650 MG RE SUPP: 650.0000 mg | Freq: Four times a day (QID) | RECTAL | Status: DC | PRN

## 2019-05-30 MED ORDER — LACTATED RINGERS IV SOLN: INTRAVENOUS | Status: DC

## 2019-05-30 MED ORDER — VANCOMYCIN HCL 1500 MG/300ML IV SOLN
1500.0000 mg | Freq: Once | INTRAVENOUS | Status: AC
  Administered 2019-05-30: 1500 mg via INTRAVENOUS
  Filled 2019-05-30: qty 300

## 2019-05-30 MED ORDER — PROPOFOL 500 MG/50ML IV EMUL
INTRAVENOUS | Status: DC | PRN
  Administered 2019-05-30: 100 ug/kg/min via INTRAVENOUS

## 2019-05-30 MED ORDER — PRAVASTATIN SODIUM 10 MG PO TABS
20.0000 mg | ORAL_TABLET | Freq: Every day | ORAL | Status: DC
  Administered 2019-05-30 – 2019-06-05 (×7): 20 mg via ORAL
  Filled 2019-05-30 (×7): qty 2

## 2019-05-30 MED ORDER — ONDANSETRON HCL 4 MG/2ML IJ SOLN
4.0000 mg | Freq: Four times a day (QID) | INTRAMUSCULAR | Status: DC | PRN
  Administered 2019-06-02: 4 mg via INTRAVENOUS
  Filled 2019-05-30: qty 2

## 2019-05-30 MED ORDER — PROPOFOL 10 MG/ML IV BOLUS
INTRAVENOUS | Status: DC | PRN
  Administered 2019-05-30: 20 mg via INTRAVENOUS

## 2019-05-30 MED ORDER — ACETAMINOPHEN 325 MG PO TABS
650.0000 mg | ORAL_TABLET | Freq: Four times a day (QID) | ORAL | Status: DC | PRN
  Administered 2019-05-31 – 2019-06-06 (×9): 650 mg via ORAL
  Filled 2019-05-30 (×10): qty 2

## 2019-05-30 MED ORDER — MORPHINE SULFATE (PF) 2 MG/ML IV SOLN
1.0000 mg | Freq: Once | INTRAVENOUS | Status: AC
  Administered 2019-05-30: 1 mg via INTRAVENOUS
  Filled 2019-05-30: qty 1

## 2019-05-30 MED ORDER — LACTATED RINGERS IV SOLN
INTRAVENOUS | Status: AC | PRN
  Administered 2019-05-30: 1000 mL via INTRAVENOUS

## 2019-05-30 MED ORDER — VANCOMYCIN HCL 1250 MG/250ML IV SOLN
1250.0000 mg | INTRAVENOUS | Status: DC
  Administered 2019-05-31: 1250 mg via INTRAVENOUS
  Filled 2019-05-30: qty 250

## 2019-05-30 MED ORDER — MORPHINE SULFATE (PF) 2 MG/ML IV SOLN
1.0000 mg | INTRAVENOUS | Status: DC | PRN
  Administered 2019-05-30 – 2019-06-03 (×10): 1 mg via INTRAVENOUS
  Filled 2019-05-30 (×10): qty 1

## 2019-05-30 MED ORDER — ONDANSETRON HCL 4 MG PO TABS: 4.0000 mg | ORAL_TABLET | Freq: Four times a day (QID) | ORAL | Status: DC | PRN

## 2019-05-30 SURGICAL SUPPLY — 15 items

## 2019-05-30 NOTE — Op Note (Signed)
[facility] Patient Name: Samantha Bishop Procedure Date : 05/30/2019 MRN: [id number] Attending MD: Thornton Park MD, MD Date of Birth: 11-13-1924 CSN: [id number] Age: 86 Admit Type: Inpatient Procedure:                Upper GI endoscopy Indications:              Suspected upper gastrointestinal bleeding                           Blood in the mouth x several days                           Non-diagnostic ENT evaluation with laryngoscopy                            earlier today Providers:                Thornton Park MD, MD, Carlyn Reichert, RN, Lazaro Arms, Technician Referring MD:              Medicines:                Monitored Anesthesia Care Complications:            No immediate complications. Estimated Blood Loss:     Estimated blood loss: none. Procedure:                Pre-Anesthesia Assessment:                           - Prior to the procedure, a History and Physical                            was performed, and patient medications and                            allergies were reviewed. The patient's tolerance of                            previous anesthesia was also reviewed. The risks                            and benefits of the procedure and the sedation                            options and risks were discussed with the patient.                            All questions were answered, and informed consent                            was obtained. Prior Anticoagulants: The patient has                            taken Eliquis (apixaban), last dose was 6  days                            prior to procedure. ASA Grade Assessment: III - A                            patient with severe systemic disease. After                            reviewing the risks and benefits, the patient was                            deemed in satisfactory condition to undergo the                            procedure.                           After  obtaining informed consent, the endoscope was                            passed under direct vision. Throughout the                            procedure, the patient's blood pressure, pulse, and                            oxygen saturations were monitored continuously. The                            [id number]) Olympus gastroscope was                            introduced through the mouth, and advanced to the                            third part of duodenum. The upper GI endoscopy was                            accomplished without difficulty. The patient                            tolerated the procedure well. Scope In: Scope Out: Findings:      The esophagus was normal. No mucosal abnormalities seen. No blood       present.      Patchy mildly erythematous mucosa without bleeding was found in the       gastric fundus and in the gastric antrum. No blood present in the       stomach.      The examined duodenum was normal. No blood present in the duodenum.      The cardia and gastric fundus were normal on retroflexion.      The exam was otherwise without abnormality. Impression:               - Normal esophagus.                           -  Erythematous mucosa in the gastric fundus and                            gastric body. Findings too mild to explain recent                            bleeding.                           - Normal examined duodenum.                           - The examination was otherwise normal.                           - No source for recent bleeding identified on this                            examination. Bleeding is not thought to be coming                            from the esophagus, stomach, or duodenum. Recommendation:           - Advance diet as tolerated.                           - Continue present medications.                           - No further GI evaluation indicated at this time.                           - The inpatient GI team will move  to stand-by.                            Please call the on-call gastroenterologist with any                            additional questions or concerns during this                            hospitalization.                           The patient' son, [name], was updated by                            phone. All questions were answered to his                            satisfaction. Procedure Code(s):        --- Professional ---                           [phone], Esophagogastroduodenoscopy, flexible,  transoral; diagnostic, including collection of                            specimen(s) by brushing or washing, when performed                            (separate procedure) Diagnosis Code(s):        --- Professional ---                           K31.89, Other diseases of stomach and duodenum CPT copyright 2019 [employer]. All rights reserved. The codes documented in this report are preliminary and upon coder review may  be revised to meet current compliance requirements. Thornton Park MD, MD 05/30/2019 3:06:08 PM This report has been signed electronically. Number of Addenda: 0

## 2019-05-30 NOTE — Progress Notes (Signed)
Called into patient's room, patient bleeding from nares, and spitting up blood.  Dr. Ree Kida, notified.

## 2019-05-30 NOTE — Progress Notes (Signed)
Pharmacy Antibiotic Note  Samantha Bishop is a 86 y.o. female admitted on 05/29/2019 with LLE cellulitis.  Pharmacy has been consulted for Vancomycin  dosing.  Plan: Vancomycin 1500 mg IV now, then Vancomycin 1250 mg IV q24h Est AUC 453    Temp (24hrs), Avg:99 F (37.2 C), Min:98.6 F (37 C), Max:99.4 F (37.4 C)  Recent Labs  Lab 05/29/19 2241 05/30/19 0133  WBC 9.6  --   CREATININE 0.65  --   LATICACIDVEN  --  0.8    Estimated Creatinine Clearance: 48.7 mL/min (by C-G formula based on SCr of 0.65 mg/dL).    Allergies  Allergen Reactions  . Codeine     REACTION: causes nausea  . Penicillins     REACTION: rash     Samantha Bishop 05/30/2019 6:00 AM

## 2019-05-30 NOTE — Progress Notes (Signed)
EGD procedure note available in EPIC. No evidence for recent GI bleeding on endoscopy.   Endoscopy results reviewed with the patient's son by phone, Icy Champigny, [phone]. All questions were answered to his satisfaction. He expressed thanks for the call and requested further updates by the hospitalist while here.  I also update Dr. Cristal Ford, the hospitalist, regarding the findings and recommendations.  The inpatient GI team will move to stand-by. Please call the on-call gastroenterologist with any additional questions or concerns during this hospitalization.

## 2019-05-30 NOTE — Progress Notes (Addendum)
Subjective: Acute bleeding from mouth this morning. No blood from nose (despite vigorous blowing). Pt denies any blood from nares. She had another short episode of bleeding yesterday evening.  Objective: Vital signs in last 24 hours: Temp:  [98.3 F (36.8 C)-99.4 F (37.4 C)] 98.3 F (36.8 C) (01/20 0742) Pulse Rate:  [78-88] 80 (01/20 0742) Resp:  [14-18] 17 (01/20 0742) BP: (121-146)/(56-94) 133/64 (01/20 0742) SpO2:  [91 %-99 %] 98 % (01/20 0742) Weight:  [81.2 kg] 81.2 kg (01/19 1525)  Exam: General: Communicates without difficulty, well nourished, no acute distress.  Head: Normocephalic, no evidence injury, no tenderness, facial buttresses intact without stepoff.  Eyes: PERRL, EOMI. No scleral icterus, conjunctivae clear. Neuro: CN II exam reveals vision grossly intact.  No nystagmus at any point of gaze.  Ears: Auricles well formed without lesions.  Ear canals are intact without mass or lesion.  No erythema or edema is appreciated.  The TMs are intact without fluid.  Nose: External evaluation reveals normal support and skin without lesions.  Dorsum is intact.  Anterior rhinoscopy reveals healthy pink mucosa over anterior aspect of inferior turbinates and intact septum.  No bleeding noted.  Oral:  Blood clots noted in mouth and oropharynx. Neck: Full range of motion without pain.  There is no significant lymphadenopathy.  No masses palpable.  Thyroid bed within normal limits to palpation.  Parotid glands and submandibular glands equal bilaterally without mass.  Trachea is midline.  Neuro:  CN 2-12 grossly intact.   Procedure:  Flexible Fiberoptic Laryngoscopy Anesthesia: None. Indication: Hemoptysis. Description: Risks, benefits, and alternatives of flexible endoscopy were explained to the patient.  The patient gave oral consent to proceed.  The flexible scope was inserted into the right nasal cavity and advanced towards the nasopharynx.  Visualized mucosa over the turbinates and  septum were normal.  The nasopharynx was clear. No bleeding. Oropharyngeal walls were symmetric and mobile without lesion, mass, or edema.  Hypopharynx with blood clots.  Larynx was mobile without lesions.  No lesions or asymmetry in the supraglottic larynx.  True vocal folds were pale yellow and without mass or lesion.  The scope was withdrawn and the right nasal cavity is examined with no bleeding noted.  Recent Labs    05/29/19 2241 05/30/19 0700  WBC 9.6 7.8  HGB 11.9* 11.3*  HCT 37.5 34.5*  PLT 240 218   Recent Labs    05/29/19 2241 05/30/19 0700  NA 137 138  K 3.9 3.6  CL 102 103  CO2 24 24  GLUCOSE 117* 113*  BUN 13 11  CREATININE 0.65 0.60  CALCIUM 9.8 9.1    Medications:  I have reviewed the patient's current medications. Scheduled: . pravastatin  20 mg Oral q1800   Continuous: . ceFEPime (MAXIPIME) IV    . [START ON 05/31/2019] vancomycin      Assessment/Plan: No bleeding noted from the nasal cavities or the nasopharynx (on nasal endoscopy). - Blood clots noted in the hypopharynx on laryngoscopy. - Recommend GI consult for possible esophageal bleeding.   LOS: 0 days   Hannan Hutmacher W Jayke Caul 05/30/2019, 8:34 AM

## 2019-05-30 NOTE — H&P (Signed)
History and Physical    Samantha Bishop [id number] DOB: [birth year] DOA: 05/29/2019  PCP: Hoyt Koch, MD  Patient coming from: Home.  Chief Complaint: Right lower extremity pain and epistaxis.  HPI: Samantha Bishop is a 86 y.o. female with history of A. fib and previous stroke presents to the ER because of worsening right lower extremity pain and epistaxis.  Patient's right lower extremity pain started about 8 days ago and was placed on empiric antibiotics by primary care physician despite taking which patient's pain has not improved.  About 4 days ago patient started developing epistaxis and had come to the ER and was seen by ENT surgeon who had followed up in the clinic and had cauterization done 2 days ago.  Patient had followed up with her primary care physician yesterday for the right lower extremity pain during which patient started having epistaxis again and was referred to the ER.  ED Course: In the ER patient is hemodynamically stable but has significant pain in the right lower extremity and also has some epistaxis off and on while in the ER.  ER physician discussed with Dr. Arnette Norris patient's ENT surgeon will be seeing patient in consult.  Patient has not been taking her apixaban for last 5 days.  On exam patient's right lower extremity is erythematous and tender to touch.  Able to move all her extremities no definite signs of any compartment syndrome.  This is extremity patient has had an injury about 10 years ago and has had cellulitis previously in the same leg.  X-rays reveal no osteomyelitis but does show signs of cellulitis.  Labs show hemoglobin 11.9 Covid test negative.  Review of Systems: As per HPI, rest all negative.   Past Medical History:  Diagnosis Date  . Anxiety state, unspecified   . Calculus of kidney   . Cerebral aneurysm, nonruptured   . Colon polyp   . Complication of anesthesia    "my heart stopped" 2004 knee  surg - see note on chart  . DEEP  VENOUS THROMBOPHLEBITIS 08/15/2007   Qualifier: History of  By: Lenna Gilford MD, Deborra Medina   . Diverticulosis of colon (without mention of hemorrhage)   . History of skin cancer   . HTN (hypertension)   . Hyperlipidemia   . Lumbago   . Osteoarthrosis, unspecified whether generalized or localized, unspecified site   . Permanent atrial fibrillation ([place])   . Pulmonary emboli ([place])    following remote knee arthroscopy surgery  . Ruptured lumbar disc   . Sleep apnea    stop bang score 4   . Unspecified hemorrhoids without mention of complication     Past Surgical History:  Procedure Laterality Date  . ABDOMINAL HYSTERECTOMY    . APPENDECTOMY    . BLOCKED INTESTINE SURGERY    . BREAST CYST EXCISION    . HEMHORROIDECTOMY    . JOINT REPLACEMENT  2004   RT TOTAL KNEE  . KNEE ARTHROSCOPY     X2 L KNEE  . KNEE SURGERY    . NASAL SURGERY X2    . TONSILLECTOMY AND ADENOIDECTOMY    . TOTAL KNEE ARTHROPLASTY  09/03/2011   Procedure: TOTAL KNEE ARTHROPLASTY;  Surgeon: Gearlean Alf, MD;  Location: [facility];  Service: Orthopedics;  Laterality: Left;     reports that she has never smoked. She has never used smokeless tobacco. She reports current alcohol use. She reports that she does not use drugs.  Allergies  Allergen Reactions  .  Codeine     REACTION: causes nausea  . Penicillins     REACTION: rash    Family History  Problem Relation Age of Onset  . Coronary artery disease Father   . Stroke Father   . Cirrhosis Brother   . Colon cancer Son   . Colon polyps Neg Hx   . Kidney disease Neg Hx   . Diabetes Neg Hx     Prior to Admission medications   Medication Sig Start Date End Date Taking? Authorizing Provider  apixaban (ELIQUIS) 5 MG TABS tablet TAKE 1 TABLET (5 MG TOTAL) BY MOUTH 2 (TWO) TIMES DAILY. 10/19/18   Hoyt Koch, MD  fluticasone Asencion Islam) 50 MCG/ACT nasal spray Place 2 sprays into both nostrils daily. 02/10/18   Hoyt Koch, MD  gabapentin (NEURONTIN) 100  MG capsule TAKE 3-4 CAPSULES (300-400 MG TOTAL) BY MOUTH 2 (TWO) TIMES DAILY. 09/21/18   Hoyt Koch, MD  hydrochlorothiazide (HYDRODIURIL) 25 MG tablet TAKE 1/2 TABLET BY MOUTH EVERY DAY 04/27/19   Hoyt Koch, MD  oxyCODONE (OXY IR/ROXICODONE) 5 MG immediate release tablet Take 1 tablet (5 mg total) by mouth every 4 (four) hours as needed for severe pain. 05/17/19   Janith Lima, MD  pravastatin (PRAVACHOL) 20 MG tablet TAKE 1 TABLET (20 MG TOTAL) BY MOUTH DAILY AT 6 PM. 04/27/19   Hoyt Koch, MD    Physical Exam: Constitutional: Moderately built and nourished. Vitals:   05/29/19 2300 05/30/19 0200 05/30/19 0300 05/30/19 0400  BP: (!) 121/56 133/66 129/62 123/78  Pulse: 80 83 81 78  Resp:  15 14 15   Temp:      TempSrc:      SpO2: 96% 98% 93% 91%   Eyes: Anicteric no pallor. ENMT: No discharge from the ears eyes nose or mouth. Neck: No mass felt.  No neck rigidity. Respiratory: No rhonchi or crepitations. Cardiovascular: S1-S2 heard. Abdomen: Soft nontender bowel sound present. Musculoskeletal: Swelling in the right lower extremity with tenderness. Skin: Rash of the right lower extremity. Neurologic: Alert awake oriented time place and person.  Moves all extremities. Psychiatric: Appears normal but normal affect.   Labs on Admission: I have personally reviewed following labs and imaging studies  CBC: Recent Labs  Lab 05/29/19 2241  WBC 9.6  NEUTROABS 6.0  HGB 11.9*  HCT 37.5  MCV 90.1  PLT [id number]   Basic Metabolic Panel: Recent Labs  Lab 05/29/19 2241  NA 137  K 3.9  CL 102  CO2 24  GLUCOSE 117*  BUN 13  CREATININE 0.65  CALCIUM 9.8   GFR: Estimated Creatinine Clearance: 48.7 mL/min (by C-G formula based on SCr of 0.65 mg/dL). Liver Function Tests: No results for input(s): AST, ALT, ALKPHOS, BILITOT, PROT, ALBUMIN in the last 168 hours. No results for input(s): LIPASE, AMYLASE in the last 168 hours. No results for input(s):  AMMONIA in the last 168 hours. Coagulation Profile: No results for input(s): INR, PROTIME in the last 168 hours. Cardiac Enzymes: No results for input(s): CKTOTAL, CKMB, CKMBINDEX, TROPONINI in the last 168 hours. BNP (last 3 results) No results for input(s): PROBNP in the last 8760 hours. HbA1C: No results for input(s): HGBA1C in the last 72 hours. CBG: No results for input(s): GLUCAP in the last 168 hours. Lipid Profile: No results for input(s): CHOL, HDL, LDLCALC, TRIG, CHOLHDL, LDLDIRECT in the last 72 hours. Thyroid Function Tests: No results for input(s): TSH, T4TOTAL, FREET4, T3FREE, THYROIDAB in the last 72 hours.  Anemia Panel: No results for input(s): VITAMINB12, FOLATE, FERRITIN, TIBC, IRON, RETICCTPCT in the last 72 hours. Urine analysis:    Component Value Date/Time   COLORURINE YELLOW 06/26/2016 1536   APPEARANCEUR CLOUDY (A) 06/26/2016 1536   LABSPEC 1.005 06/26/2016 1536   PHURINE 6.0 06/26/2016 1536   GLUCOSEU NEGATIVE 06/26/2016 1536   HGBUR LARGE (A) 06/26/2016 1536   BILIRUBINUR neg 07/15/2016 1649   KETONESUR NEGATIVE 06/26/2016 1536   PROTEINUR 15mg  07/15/2016 1649   PROTEINUR NEGATIVE 06/26/2016 1536   UROBILINOGEN 0.2 07/15/2016 1649   UROBILINOGEN 0.2 08/20/2011 1151   NITRITE neg 07/15/2016 1649   NITRITE POSITIVE (A) 06/26/2016 1536   LEUKOCYTESUR Negative 07/15/2016 1649   Sepsis Labs: @LABRCNTIP (procalcitonin:4,lacticidven:4) )No results found for this or any previous visit (from the past 240 hour(s)).   Radiological Exams on Admission: DG Tibia/Fibula Right  Result Date: 05/30/2019 CLINICAL DATA:  Infection. Cellulitis. EXAM: RIGHT TIBIA AND FIBULA - 2 VIEW COMPARISON:  May 29, 2007 FINDINGS: The patient is status post total knee arthroplasty. There is nonspecific subcutaneous edema involving the right lower extremity. Multiple calcifications are noted in the subcutaneous soft tissues which may related to chronic venous insufficiency. There  is no radiographic evidence for osteomyelitis. There is no acute displaced fracture. There are no pockets of subcutaneous gas. IMPRESSION: 1. No radiographic evidence for osteomyelitis. 2. Nonspecific subcutaneous edema involving the right lower extremity. This can be seen in patients with cellulitis in the appropriate clinical setting. 3. Soft tissue calcifications, likely related to chronic venous insufficiency. Electronically Signed   By: Constance Holster M.D.   On: 05/30/2019 00:55      Assessment/Plan Principal Problem:   Cellulitis, leg Active Problems:   Essential hypertension   Atrial fibrillation - followed by Dr. Caryl Comes   Epistaxis    1. Right lower extremity cellulitis with significant pain x-rays does show some edema and patient failed outpatient oral antibiotics we will keep patient IV antibiotics now.  Closely monitor. 2. Epistaxis being followed by ENT surgeon Dr. Arnette Norris.  Apixaban on hold for last 5 days.  Closely monitor. 3. History of A. fib presently rate controlled without any rate limiting medications.  Apixaban on hold due to epistaxis.  Patient aware of the risk of stroke. 4. Hypertension since had some low blood pressure readings will keep patient on as needed IV hydralazine hold hydrochlorothiazide. 5. Anemia could be from blood loss follow CBC. 6. Previous history of stroke on statins and presently apixaban on hold due to epistaxis.  Given significant pain and cellulitis with ongoing epistaxis will need close monitoring for any deterioration and will need inpatient status.  Patient has been kept n.p.o. in the morning except medication in anticipation of possible ENT procedure.   DVT prophylaxis: SCDs due to active bleeding. Code Status: Full code. Family Communication: Discussed with patient. Disposition Plan: Home. Consults called: ENT surgeon. Admission status: Inpatient.   Rise Patience MD [facility] Pager [phone].  If 7PM-7AM,  please contact night-coverage [email or web address] Password [facility]  05/30/2019, 5:19 AM

## 2019-05-30 NOTE — Consult Note (Signed)
Referring Provider:  [facility]         Primary Care Physician:  Hoyt Koch, MD Primary Gastroenterologist:              We were asked to see this patient for:  GI bleed           ASSESSMENT /  PLAN    86 yo female with pmh significant for Afib ( on Eliquis), HTN, hyperlipidemia, CVA  1. Bleeding / mild [place] anemia. Expectorating blood. She had nasal cautery Monday per PCP note but apparently at no time has patient had any blood dripping out of nose and no blood seen when blowing her nose. She feels blood 'filling up mouth". Negative ENT evaluation / laryngoscopy earlier today. Source of bleed unclear but given negative ENT evaluation will proceed with excluding upper GI bleed.  -Patient is NPO, off Eliquis for 5 days. Will proceed with EGD this afternoon. The risks and benefits of EGD were discussed and the patient agrees to proceed.  -monitor H+H. Hgb 11.3, not far from baseline of around 12  2. RLE cellulitis, on antibiotics  3. Afib, Eliquis if on hold   HPI:    Chief Complaint: blood in mouth   Samantha Bishop is a 86 y.o. female with above pmh who presented to the ED early this am for worsening RLE pain. Recently treated for cellulitis of that leg. She also presented with "nosebleed".  Bleeding started approximately 4 days ago. She feels warm blood filling up her mouth but hasn't had any blood dripping out of nose, not even when she blows her nose. She had seen ENT on Monday, describes hot probe used in both nares but unsure of any findings. Seen yesterday at PCP's office for RLE pain, she began having recurrent bleeding and was referred to ED.   Patient has been off Eliquis for 5 days. Hgb is 11.9, at baseline. Seen by ENT this am. No bleeding from nasal cavities or nasopharynx on laryngoscopy so GI consult recommended. Denies previous history of GI bleeding. She has no pain in nose, throat or chest pain. No blood in stool / melena   Past Medical History:    Diagnosis Date  . Anxiety state, unspecified   . Calculus of kidney   . Cerebral aneurysm, nonruptured   . Colon polyp   . Complication of anesthesia    "my heart stopped" 2004 knee  surg - see note on chart  . DEEP VENOUS THROMBOPHLEBITIS 08/15/2007   Qualifier: History of  By: Lenna Gilford MD, Deborra Medina   . Diverticulosis of colon (without mention of hemorrhage)   . History of skin cancer   . HTN (hypertension)   . Hyperlipidemia   . Lumbago   . Osteoarthrosis, unspecified whether generalized or localized, unspecified site   . Permanent atrial fibrillation ([place])   . Pulmonary emboli ([place])    following remote knee arthroscopy surgery  . Ruptured lumbar disc   . Sleep apnea    stop bang score 4   . Unspecified hemorrhoids without mention of complication     Past Surgical History:  Procedure Laterality Date  . ABDOMINAL HYSTERECTOMY    . APPENDECTOMY    . BLOCKED INTESTINE SURGERY    . BREAST CYST EXCISION    . HEMHORROIDECTOMY    . JOINT REPLACEMENT  2004   RT TOTAL KNEE  . KNEE ARTHROSCOPY     X2 L KNEE  . KNEE SURGERY    .  NASAL SURGERY X2    . TONSILLECTOMY AND ADENOIDECTOMY    . TOTAL KNEE ARTHROPLASTY  09/03/2011   Procedure: TOTAL KNEE ARTHROPLASTY;  Surgeon: Gearlean Alf, MD;  Location: [facility];  Service: Orthopedics;  Laterality: Left;    Prior to Admission medications   Medication Sig Start Date End Date Taking? Authorizing Provider  cycloSPORINE (RESTASIS) 0.05 % ophthalmic emulsion Place 1 drop into both eyes as needed (dry eyes).   Yes [provider]  Dietary Management Product (VASCULERA) TABS Take 630 mg by mouth daily.   Yes [provider]  fluticasone (FLONASE) 50 MCG/ACT nasal spray Place 2 sprays into both nostrils daily. 02/10/18  Yes Hoyt Koch, MD  gabapentin (NEURONTIN) 100 MG capsule TAKE 3-4 CAPSULES (300-400 MG TOTAL) BY MOUTH 2 (TWO) TIMES DAILY. 09/21/18  Yes Hoyt Koch, MD  hydrochlorothiazide (HYDRODIURIL) 25  MG tablet TAKE 1/2 TABLET BY MOUTH EVERY DAY Patient taking differently: Take 12.5 mg by mouth daily.  04/27/19  Yes Hoyt Koch, MD  oxyCODONE (OXY IR/ROXICODONE) 5 MG immediate release tablet Take 1 tablet (5 mg total) by mouth every 4 (four) hours as needed for severe pain. 05/17/19  Yes Janith Lima, MD  polyethylene glycol (MIRALAX / GLYCOLAX) 17 g packet Take 17 g by mouth as needed for mild constipation.   Yes [provider]  pravastatin (PRAVACHOL) 20 MG tablet TAKE 1 TABLET (20 MG TOTAL) BY MOUTH DAILY AT 6 PM. 04/27/19  Yes Hoyt Koch, MD  apixaban (ELIQUIS) 5 MG TABS tablet TAKE 1 TABLET (5 MG TOTAL) BY MOUTH 2 (TWO) TIMES DAILY. Patient taking differently: Take 5 mg by mouth 2 (two) times daily. TAKE 1 TABLET (5 MG TOTAL) BY MOUTH 2 (TWO) TIMES DAILY. 10/19/18   Hoyt Koch, MD  cefdinir (OMNICEF) 300 MG capsule Take 300 mg by mouth 2 (two) times daily. 10 DS 05/17/19   [provider]    Current Facility-Administered Medications  Medication Dose Route Frequency Provider Last Rate Last Admin  . acetaminophen (TYLENOL) tablet 650 mg  650 mg Oral Q6H PRN Rise Patience, MD       Or  . acetaminophen (TYLENOL) suppository 650 mg  650 mg Rectal Q6H PRN Rise Patience, MD      . ceFEPIme (MAXIPIME) 2 g in sodium chloride 0.9 % 100 mL IVPB  2 g Intravenous Q12H Rise Patience, MD      . hydrALAZINE (APRESOLINE) injection 10 mg  10 mg Intravenous Q4H PRN Rise Patience, MD      . morphine 2 MG/ML injection 1 mg  1 mg Intravenous Q3H PRN Rise Patience, MD   1 mg at 05/30/19 0608  . ondansetron (ZOFRAN) tablet 4 mg  4 mg Oral Q6H PRN Rise Patience, MD       Or  . ondansetron [facility]) injection 4 mg  4 mg Intravenous Q6H PRN Rise Patience, MD      . pravastatin (PRAVACHOL) tablet 20 mg  20 mg Oral q1800 Rise Patience, MD      . Derrill Memo ON 05/31/2019] vancomycin (VANCOREADY) IVPB 1250 mg/250 mL   1,250 mg Intravenous Q24H Rise Patience, MD        Allergies as of 05/29/2019 - Review Complete 05/29/2019  Allergen Reaction Noted  . Codeine    . Penicillins      Family History  Problem Relation Age of Onset  . Coronary artery disease Father   .  Stroke Father   . Cirrhosis Brother   . Colon cancer Son   . Colon polyps Neg Hx   . Kidney disease Neg Hx   . Diabetes Neg Hx     Social History   Socioeconomic History  . Marital status: Single    Spouse name: Not on file  . Number of children: 3  . Years of education: Not on file  . Highest education level: Not on file  Occupational History  . Occupation: Retired  Tobacco Use  . Smoking status: Never Smoker  . Smokeless tobacco: Never Used  Substance and Sexual Activity  . Alcohol use: Yes    Alcohol/week: 0.0 standard drinks    Comment: Rarely  . Drug use: No  . Sexual activity: Not Currently  Other Topics Concern  . Not on file  Social History Narrative  . Not on file   Social Determinants of Health   Financial Resource Strain:   . Difficulty of Paying Living Expenses: Not on file  Food Insecurity:   . Worried About Charity fundraiser in the Last Year: Not on file  . Ran Out of Food in the Last Year: Not on file  Transportation Needs:   . Lack of Transportation (Medical): Not on file  . Lack of Transportation (Non-Medical): Not on file  Physical Activity:   . Days of Exercise per Week: Not on file  . Minutes of Exercise per Session: Not on file  Stress:   . Feeling of Stress : Not on file  Social Connections:   . Frequency of Communication with Friends and Family: Not on file  . Frequency of Social Gatherings with Friends and Family: Not on file  . Attends Religious Services: Not on file  . Active Member of Clubs or Organizations: Not on file  . Attends Archivist Meetings: Not on file  . Marital Status: Not on file  Intimate Partner Violence:   . Fear of Current or Ex-Partner: Not  on file  . Emotionally Abused: Not on file  . Physically Abused: Not on file  . Sexually Abused: Not on file    Review of Systems: All systems reviewed and negative except where noted in HPI.  Physical Exam: Vital signs in last 24 hours: Temp:  [98.3 F (36.8 C)-99.4 F (37.4 C)] 98.3 F (36.8 C) (01/20 0742) Pulse Rate:  [78-88] 80 (01/20 0742) Resp:  [14-18] 17 (01/20 0742) BP: (121-146)/(56-94) 133/64 (01/20 0742) SpO2:  [91 %-99 %] 98 % (01/20 0742) Weight:  [81.2 kg] 81.2 kg (01/19 1525)   General:   Alert, well-developed,  female in NAD Psych:  Pleasant, cooperative. Normal mood and affect. Eyes:  Pupils equal, sclera clear, no icterus.   Conjunctiva pink. Ears:  Normal auditory acuity. Nose:  No deformity, discharge,  or lesions. Neck:  Supple; no masses Lungs:  Clear throughout to auscultation.   No wheezes, crackles, or rhonchi.  Heart:  Regular rate, irreg rhythm; no murmurs, no lower extremity edema Abdomen:  Soft, non-distended, nontender, BS active, no palp mass   Rectal:  Deferred  Msk:  Symmetrical without gross deformities. . Neurologic:  Alert and  oriented x4;  grossly normal neurologically. Skin: RLE with erythematous area between calf and ankle   Intake/Output from previous day: 01/19 0701 - 01/20 0700 In: 300 [IV Piggyback:300] Out: -  Intake/Output this shift: No intake/output data recorded.  Lab Results: Recent Labs    05/29/19 2241 05/30/19 0700  WBC 9.6 7.8  HGB 11.9* 11.3*  HCT 37.5 34.5*  PLT 240 218   BMET Recent Labs    05/29/19 2241 05/30/19 0700  NA 137 138  K 3.9 3.6  CL 102 103  CO2 24 24  GLUCOSE 117* 113*  BUN 13 11  CREATININE 0.65 0.60  CALCIUM 9.8 9.1     . CBC Latest Ref Rng & Units 05/30/2019 05/29/2019 12/21/2018  WBC 4.0 - 10.5 K/uL 7.8 9.6 7.3  Hemoglobin 12.0 - 15.0 g/dL 11.3(L) 11.9(L) 12.1  Hematocrit 36.0 - 46.0 % 34.5(L) 37.5 37.0  Platelets 150 - 400 K/uL 218 240 222.0    . CMP Latest Ref Rng &  Units 05/30/2019 05/29/2019 12/21/2018  Glucose 70 - 99 mg/dL 113(H) 117(H) 116(H)  BUN 8 - 23 mg/dL 11 13 12   Creatinine 0.44 - 1.00 mg/dL 0.60 0.65 0.55  Sodium 135 - 145 mmol/L 138 137 139  Potassium 3.5 - 5.1 mmol/L 3.6 3.9 3.7  Chloride 98 - 111 mmol/L 103 102 102  CO2 22 - 32 mmol/L 24 24 29   Calcium 8.9 - 10.3 mg/dL 9.1 9.8 10.1  Total Protein 6.0 - 8.3 g/dL - - 7.8  Total Bilirubin 0.2 - 1.2 mg/dL - - 0.6  Alkaline Phos 39 - 117 U/L - - 64  AST 0 - 37 U/L - - 14  ALT 0 - 35 U/L - - 10   Studies/Results: DG Tibia/Fibula Right  Result Date: 05/30/2019 CLINICAL DATA:  Infection. Cellulitis. EXAM: RIGHT TIBIA AND FIBULA - 2 VIEW COMPARISON:  May 29, 2007 FINDINGS: The patient is status post total knee arthroplasty. There is nonspecific subcutaneous edema involving the right lower extremity. Multiple calcifications are noted in the subcutaneous soft tissues which may related to chronic venous insufficiency. There is no radiographic evidence for osteomyelitis. There is no acute displaced fracture. There are no pockets of subcutaneous gas. IMPRESSION: 1. No radiographic evidence for osteomyelitis. 2. Nonspecific subcutaneous edema involving the right lower extremity. This can be seen in patients with cellulitis in the appropriate clinical setting. 3. Soft tissue calcifications, likely related to chronic venous insufficiency. Electronically Signed   By: Constance Holster M.D.   On: 05/30/2019 00:55    Principal Problem:   Cellulitis, leg Active Problems:   Essential hypertension   Atrial fibrillation - followed by Dr. Caryl Comes   Epistaxis    Tye Savoy, NP-C @  05/30/2019, 9:10 AM

## 2019-05-30 NOTE — Anesthesia Postprocedure Evaluation (Signed)
Anesthesia Post Note  Patient: [name]  Procedure(s) Performed: ESOPHAGOGASTRODUODENOSCOPY (EGD) WITH PROPOFOL (N/A )     Patient location during evaluation: Endoscopy Anesthesia Type: MAC Level of consciousness: awake and alert Pain management: pain level controlled Vital Signs Assessment: post-procedure vital signs reviewed and stable Respiratory status: spontaneous breathing, nonlabored ventilation and respiratory function stable Cardiovascular status: stable and blood pressure returned to baseline Postop Assessment: no apparent nausea or vomiting Anesthetic complications: no    Last Vitals:  Vitals:   05/30/19 1457 05/30/19 1515  BP: (!) 130/50 (!) 141/52  Pulse: 82 78  Resp: 17 13  Temp: 36.6 C   SpO2: 96% 94%    Last Pain:  Vitals:   05/30/19 1515  TempSrc:   PainSc: 0-No pain                 Brandee Markin,W. EDMOND

## 2019-05-30 NOTE — Anesthesia Preprocedure Evaluation (Signed)
Anesthesia Evaluation  Patient identified by MRN, date of birth, ID band Patient awake    Reviewed: Allergy & Precautions, H&P , NPO status , Patient's Chart, lab work & pertinent test results  Airway Mallampati: II  TM Distance: >3 FB Neck ROM: Full    Dental no notable dental hx. (+) Teeth Intact, Dental Advisory Given   Pulmonary sleep apnea ,    Pulmonary exam normal breath sounds clear to auscultation       Cardiovascular hypertension, Pt. on medications + dysrhythmias Atrial Fibrillation  Rhythm:Irregular Rate:Normal     Neuro/Psych  Headaches, Anxiety CVA    GI/Hepatic Neg liver ROS, GERD  ,  Endo/Other  negative endocrine ROS  Renal/GU Renal disease  negative genitourinary   Musculoskeletal  (+) Arthritis , Osteoarthritis,    Abdominal   Peds  Hematology negative hematology ROS (+)   Anesthesia Other Findings   Reproductive/Obstetrics negative OB ROS                             Anesthesia Physical Anesthesia Plan  ASA: III  Anesthesia Plan: MAC   Post-op Pain Management:    Induction: Intravenous  PONV Risk Score and Plan: 2 and Propofol infusion and Treatment may vary due to age or medical condition  Airway Management Planned: Nasal Cannula  Additional Equipment:   Intra-op Plan:   Post-operative Plan:   Informed Consent: I have reviewed the patients History and Physical, chart, labs and discussed the procedure including the risks, benefits and alternatives for the proposed anesthesia with the patient or authorized representative who has indicated his/her understanding and acceptance.     Dental advisory given  Plan Discussed with: CRNA  Anesthesia Plan Comments:         Anesthesia Quick Evaluation

## 2019-05-30 NOTE — Progress Notes (Signed)
PROGRESS NOTE    Samantha Bishop  [id number] DOB: 11-13-1924 DOA: 05/29/2019 PCP: Hoyt Koch, MD   Brief Narrative:  HPI On 05/30/2019 by Dr. Gean Birchwood Samantha Bishop is a 86 y.o. female with history of A. fib and previous stroke presents to the ER because of worsening right lower extremity pain and epistaxis.  Patient's right lower extremity pain started about 8 days ago and was placed on empiric antibiotics by primary care physician despite taking which patient's pain has not improved.  About 4 days ago patient started developing epistaxis and had come to the ER and was seen by ENT surgeon who had followed up in the clinic and had cauterization done 2 days ago.  Patient had followed up with her primary care physician yesterday for the right lower extremity pain during which patient started having epistaxis again and was referred to the ER.  Assessment & Plan  Patient admitted this morning by Dr. Hal Hope.  See H&P for details.  Right lower extremity cellulitis -Patient presenting with erythema and edema of the lower extremity after failed outpatient oral antibiotics -Continue vancomycin and cefepime  Possible GI bleed -Patient was initially admitted with epistaxis -She was on Eliquis which is now been held for 5 days -ENT consulted Dr.Teo-status post flexible fiberoptic laryngoscopy-showing no bleeding from nasal cavities or nasopharynx.  Blood clots noted in the hypopharynx on laryngoscopy. -Gastroenterology consulted and appreciated, planning for EGD later today.    History of atrial fibrillation -Eliquis currently held  Essential hypertension -Continue IV hydralazine as needed, HCTZ currently held  Normocytic Anemia -Hemoglobin currently stable 11.3, continue to monitor CBC  History of CVA -Continue statin, Eliquis currently held  DVT Prophylaxis SCDs  Code Status: Full  Family Communication: None at bedside  Disposition Plan: Admitted.  Pending  further evaluation of bleeding as well as treatment of right lower extremity cellulitis.  Disposition TBD  Consultants ENT, Dr. Melene Plan Gastroenterology  Procedures  Flexible fiberoptic laryngoscopy  Antibiotics   Anti-infectives (From admission, onward)   Start     Dose/Rate Route Frequency Ordered Stop   05/31/19 0800  vancomycin (VANCOREADY) IVPB 1250 mg/250 mL     1,250 mg 166.7 mL/hr over 90 Minutes Intravenous Every 24 hours 05/30/19 0604     05/30/19 0800  ceFEPIme (MAXIPIME) 2 g in sodium chloride 0.9 % 100 mL IVPB     2 g 200 mL/hr over 30 Minutes Intravenous Every 12 hours 05/30/19 0625     05/30/19 0145  vancomycin (VANCOREADY) IVPB 1500 mg/300 mL     1,500 mg 150 mL/hr over 120 Minutes Intravenous  Once 05/30/19 0143 05/30/19 0435      Subjective:   [name] seen and examined today.  Patient feels very hungry.  Denies any further bleeding from her nose.  Denies chest pain, shortness of breath, abdominal pain, nausea or vomiting, diarrhea or constipation.  Does state that her leg still hurts. Objective:   Vitals:   05/30/19 0200 05/30/19 0300 05/30/19 0400 05/30/19 0742  BP: 133/66 129/62 123/78 133/64  Pulse: 83 81 78 80  Resp: 15 14 15 17   Temp:    98.3 F (36.8 C)  TempSrc:      SpO2: 98% 93% 91% 98%    Intake/Output Summary (Last 24 hours) at 05/30/2019 1051 Last data filed at 05/30/2019 0435 Gross per 24 hour  Intake 300 ml  Output --  Net 300 ml   There were no vitals filed for this visit.  Exam Patient appears to be alert and oriented x3. Right lower extremity with erythema and edema.   Data Reviewed: I have personally reviewed following labs and imaging studies  CBC: Recent Labs  Lab 05/29/19 2241 05/30/19 0700  WBC 9.6 7.8  NEUTROABS 6.0 5.1  HGB 11.9* 11.3*  HCT 37.5 34.5*  MCV 90.1 88.7  PLT 240 [id number]   Basic Metabolic Panel: Recent Labs  Lab 05/29/19 2241 05/30/19 0700  NA 137 138  K 3.9 3.6  CL 102 103  CO2 24 24   GLUCOSE 117* 113*  BUN 13 11  CREATININE 0.65 0.60  CALCIUM 9.8 9.1   GFR: Estimated Creatinine Clearance: 48.7 mL/min (by C-G formula based on SCr of 0.6 mg/dL). Liver Function Tests: No results for input(s): AST, ALT, ALKPHOS, BILITOT, PROT, ALBUMIN in the last 168 hours. No results for input(s): LIPASE, AMYLASE in the last 168 hours. No results for input(s): AMMONIA in the last 168 hours. Coagulation Profile: No results for input(s): INR, PROTIME in the last 168 hours. Cardiac Enzymes: No results for input(s): CKTOTAL, CKMB, CKMBINDEX, TROPONINI in the last 168 hours. BNP (last 3 results) No results for input(s): PROBNP in the last 8760 hours. HbA1C: No results for input(s): HGBA1C in the last 72 hours. CBG: No results for input(s): GLUCAP in the last 168 hours. Lipid Profile: No results for input(s): CHOL, HDL, LDLCALC, TRIG, CHOLHDL, LDLDIRECT in the last 72 hours. Thyroid Function Tests: No results for input(s): TSH, T4TOTAL, FREET4, T3FREE, THYROIDAB in the last 72 hours. Anemia Panel: No results for input(s): VITAMINB12, FOLATE, FERRITIN, TIBC, IRON, RETICCTPCT in the last 72 hours. Urine analysis:    Component Value Date/Time   COLORURINE YELLOW 06/26/2016 1536   APPEARANCEUR CLOUDY (A) 06/26/2016 1536   LABSPEC 1.005 06/26/2016 1536   PHURINE 6.0 06/26/2016 1536   GLUCOSEU NEGATIVE 06/26/2016 1536   HGBUR LARGE (A) 06/26/2016 1536   BILIRUBINUR neg 07/15/2016 1649   KETONESUR NEGATIVE 06/26/2016 1536   PROTEINUR 15mg  07/15/2016 1649   PROTEINUR NEGATIVE 06/26/2016 1536   UROBILINOGEN 0.2 07/15/2016 1649   UROBILINOGEN 0.2 08/20/2011 1151   NITRITE neg 07/15/2016 1649   NITRITE POSITIVE (A) 06/26/2016 1536   LEUKOCYTESUR Negative 07/15/2016 1649   Sepsis Labs: @LABRCNTIP (procalcitonin:4,lacticidven:4)  ) Recent Results (from the past 240 hour(s))  SARS CORONAVIRUS 2 (TAT 6-24 HRS) Nasopharyngeal Nasopharyngeal Swab     Status: None   Collection Time:  05/30/19  1:33 AM   Specimen: Nasopharyngeal Swab  Result Value Ref Range Status   SARS Coronavirus 2 NEGATIVE NEGATIVE Final    Comment: (NOTE) SARS-CoV-2 target nucleic acids are NOT DETECTED. The SARS-CoV-2 RNA is generally detectable in upper and lower respiratory specimens during the acute phase of infection. Negative results do not preclude SARS-CoV-2 infection, do not rule out co-infections with other pathogens, and should not be used as the sole basis for treatment or other patient management decisions. Negative results must be combined with clinical observations, patient history, and epidemiological information. The expected result is Negative. Fact Sheet for Patients: SugarRoll.be Fact Sheet for Healthcare Providers: https://www.woods-mathews.com/ This test is not yet approved or cleared by the [place] FDA and  has been authorized for detection and/or diagnosis of SARS-CoV-2 by FDA under an Emergency Use Authorization (EUA). This EUA will remain  in effect (meaning this test can be used) for the duration of the COVID-19 declaration under Section 56 4(b)(1) of the Act, 21 U.S.C. section 360bbb-3(b)(1), unless the authorization is terminated or revoked sooner.  Performed at [facility], [place] [address], [place], [place] [address]       Radiology Studies: DG Tibia/Fibula Right  Result Date: 05/30/2019 CLINICAL DATA:  Infection. Cellulitis. EXAM: RIGHT TIBIA AND FIBULA - 2 VIEW COMPARISON:  May 29, 2007 FINDINGS: The patient is status post total knee arthroplasty. There is nonspecific subcutaneous edema involving the right lower extremity. Multiple calcifications are noted in the subcutaneous soft tissues which may related to chronic venous insufficiency. There is no radiographic evidence for osteomyelitis. There is no acute displaced fracture. There are no pockets of subcutaneous gas. IMPRESSION: 1. No radiographic  evidence for osteomyelitis. 2. Nonspecific subcutaneous edema involving the right lower extremity. This can be seen in patients with cellulitis in the appropriate clinical setting. 3. Soft tissue calcifications, likely related to chronic venous insufficiency. Electronically Signed   By: Constance Holster M.D.   On: 05/30/2019 00:55     Scheduled Meds: . pravastatin  20 mg Oral q1800   Continuous Infusions: . ceFEPime (MAXIPIME) IV 2 g (05/30/19 0932)  . [START ON 05/31/2019] vancomycin       LOS: 0 days   Time Spent in minutes   45 minutes  Shontay Wallner D.O. on 05/30/2019 at 10:51 AM  Between 7am to 7pm - Please see pager noted on [email or web address]  After 7pm go to [email or web address]  And look for the night coverage person covering for me after hours  [facility] Office  [phone]

## 2019-05-30 NOTE — Plan of Care (Signed)

## 2019-05-30 NOTE — Anesthesia Procedure Notes (Signed)
Procedure Name: MAC Date/Time: 05/30/2019 2:41 PM Performed by: Imagene Riches, CRNA Pre-anesthesia Checklist: Patient identified, Emergency Drugs available and Suction available Patient Re-evaluated:Patient Re-evaluated prior to induction Oxygen Delivery Method: Nasal cannula

## 2019-05-30 NOTE — Transfer of Care (Signed)
Immediate Anesthesia Transfer of Care Note  Patient: Samantha Bishop  Procedure(s) Performed: ESOPHAGOGASTRODUODENOSCOPY (EGD) WITH PROPOFOL (N/A )  Patient Location: Endoscopy Unit  Anesthesia Type:MAC  Level of Consciousness: drowsy  Airway & Oxygen Therapy: Patient Spontanous Breathing and Patient connected to nasal cannula oxygen  Post-op Assessment: Report given to RN and Post -op Vital signs reviewed and stable  Post vital signs: Reviewed and stable  Last Vitals:  Vitals Value Taken Time  BP 139/41 05/30/19 1545  Temp 36.6 C 05/30/19 1457  Pulse 82 05/30/19 1545  Resp 25 05/30/19 1546  SpO2 94 % 05/30/19 1545  Vitals shown include unvalidated device data.  Last Pain:  Vitals:   05/30/19 1515  TempSrc:   PainSc: 0-No pain         Complications: No apparent anesthesia complications

## 2019-05-31 DIAGNOSIS — K2970 Gastritis, unspecified, without bleeding: Secondary | ICD-10-CM

## 2019-05-31 DIAGNOSIS — K2990 Gastroduodenitis, unspecified, without bleeding: Secondary | ICD-10-CM

## 2019-05-31 LAB — BASIC METABOLIC PANEL
Anion gap: 10 (ref 5–15)
BUN: 14 mg/dL (ref 8–23)
CO2: 23 mmol/L (ref 22–32)
Calcium: 9.1 mg/dL (ref 8.9–10.3)
Chloride: 106 mmol/L (ref 98–111)
Creatinine, Ser: 0.6 mg/dL (ref 0.44–1.00)
GFR calc Af Amer: >60 mL/min (ref >60)
GFR calc non Af Amer: >60 mL/min (ref >60)
Glucose, Bld: 138 mg/dL — ABNORMAL HIGH (ref 70–99)
Potassium: 4.1 mmol/L (ref 3.5–5.1)
Sodium: 139 mmol/L (ref 135–145)

## 2019-05-31 LAB — CBC
HCT: 33.6 % — ABNORMAL LOW (ref 36.0–46.0)
Hemoglobin: 10.6 g/dL — ABNORMAL LOW (ref 12.0–15.0)
MCH: 27.8 pg (ref 26.0–34.0)
MCHC: 31.5 g/dL (ref 30.0–36.0)
MCV: 88.2 fL (ref 80.0–100.0)
Platelets: 211 10*3/uL (ref 150–400)
RBC: 3.81 MIL/uL — ABNORMAL LOW (ref 3.87–5.11)
RDW: 13.3 % (ref 11.5–15.5)
WBC: 8.4 10*3/uL (ref 4.0–10.5)
nRBC: 0 % (ref 0.0–0.2)

## 2019-05-31 MED ORDER — VANCOMYCIN HCL 750 MG/150ML IV SOLN
750.0000 mg | INTRAVENOUS | Status: DC
  Administered 2019-06-01 – 2019-06-02 (×2): 750 mg via INTRAVENOUS
  Filled 2019-05-31 (×3): qty 150

## 2019-05-31 MED ORDER — SALINE SPRAY 0.65 % NA SOLN
1.0000 | NASAL | Status: DC | PRN
  Administered 2019-06-01: 11:00:00 1 via NASAL
  Filled 2019-05-31: qty 44

## 2019-05-31 MED ORDER — GABAPENTIN 100 MG PO CAPS
400.0000 mg | ORAL_CAPSULE | Freq: Two times a day (BID) | ORAL | Status: DC
  Administered 2019-05-31 (×2): 400 mg via ORAL
  Filled 2019-05-31 (×2): qty 4

## 2019-05-31 NOTE — Progress Notes (Signed)
Pharmacy Antibiotic Note  Samantha Bishop is a 86 y.o. female admitted on 05/29/2019 with LLE cellulitis.  Pharmacy has been consulted for Vancomycin dosing. Also on cefepime. SCr stable 0.6.  Plan: Adjust vancomycin to 750 mg IV Q 24 hrs. Goal AUC 400-550. Expected AUC: 451 SCr used: 0.8 Cefepime 2g IV q12h Monitor clinical progress, c/s, renal function F/u de-escalation plan/LOT, vancomycin levels as indicated  Height: 5\' 6"  (167.6 cm) Weight: 186 lb 15.2 oz (84.8 kg) IBW/kg (Calculated) : 59.3  Temp (24hrs), Avg:98.6 F (37 C), Min:97.9 F (36.6 C), Max:99.3 F (37.4 C)  Recent Labs  Lab 05/29/19 2241 05/30/19 0133 05/30/19 0700 05/31/19 0323  WBC 9.6  --  7.8 8.4  CREATININE 0.65  --  0.60 0.60  LATICACIDVEN  --  0.8  --   --     Estimated Creatinine Clearance: 49.2 mL/min (by C-G formula based on SCr of 0.6 mg/dL).    Allergies  Allergen Reactions  . Codeine     REACTION: causes nausea  . Penicillins     REACTION: rash    Elicia Lamp, PharmD, BCPS Please check AMION for all [place] contact numbers Clinical Pharmacist 05/31/2019 9:38 AM

## 2019-05-31 NOTE — Consult Note (Signed)
WOC Nurse Consult Note: Reason for Consult:Cellulitis to right lower leg.  Duration : 2 weeks.  Failed conservative antibiotic therapy.  Continues with erythema and dried scaly skin integrity impairment to medial malleolus.  Wound type: infectious, acute Pressure Injury POA: NA Measurement: 3 cm x 3 cm scaly dried alteration in skin integrity Wound bed:dry and scaly Drainage (amount, consistency, odor) none noted Periwound:Right lower leg from anterior lower leg to toes is erythematous and tender to touch.  Dressing procedure/placement/frequency: Cleanse right lower leg with soap and water and pat dry.  Apply aquacel to dry skin patch on malleolus.  Wrap with kerlix and tape.  Will not follow at this time.  Please re-consult if needed.  [name] MSN, RN, FNP-BC CWON Wound, Ostomy, Continence Nurse Pager [phone]

## 2019-05-31 NOTE — Progress Notes (Signed)
Subjective: No more bleeding yesterday. No new complaint. Her upper GI did not show any source of bleeding.  Objective: Vital signs in last 24 hours: Temp:  [97.9 F (36.6 C)-99.3 F (37.4 C)] 98.5 F (36.9 C) (01/21 0818) Pulse Rate:  [78-87] 86 (01/21 0818) Resp:  [13-18] 16 (01/21 0818) BP: (130-182)/(50-70) 137/61 (01/21 0818) SpO2:  [91 %-96 %] 91 % (01/21 0818) Weight:  [84.8 kg] 84.8 kg (01/20 2120)  Exam: General: Communicates without difficulty, well nourished, no acute distress.  Head: Normocephalic, no evidence injury, no tenderness, facial buttresses intact without stepoff.  Eyes: PERRL, EOMI. No scleral icterus, conjunctivae clear. Neuro: CN II exam reveals vision grossly intact.  No nystagmus at any point of gaze.  Ears: Auricles well formed without lesions.  Ear canals are intact without mass or lesion.  No erythema or edema is appreciated.  The TMs are intact without fluid.  Nose: External evaluation reveals normal support and skin without lesions.  Dorsum is intact.  Anterior rhinoscopy reveals healthy pink mucosa over anterior aspect of inferior turbinates and intact septum.  No bleeding noted.  Oral:  Normal mucosa. No blood. Neck: Full range of motion without pain.  There is no significant lymphadenopathy.  No masses palpable.  Thyroid bed within normal limits to palpation.  Parotid glands and submandibular glands equal bilaterally without mass.  Trachea is midline.  Neuro:  CN 2-12 grossly intact.   Recent Labs    05/30/19 0700 05/31/19 0323  WBC 7.8 8.4  HGB 11.3* 10.6*  HCT 34.5* 33.6*  PLT 218 211   Recent Labs    05/30/19 0700 05/31/19 0323  NA 138 139  K 3.6 4.1  CL 103 106  CO2 24 23  GLUCOSE 113* 138*  BUN 11 14  CREATININE 0.60 0.60  CALCIUM 9.1 9.1    Medications:  I have reviewed the patient's current medications. Scheduled: . gabapentin  400 mg Oral BID  . pravastatin  20 mg Oral q1800   Continuous: . ceFEPime (MAXIPIME) IV 2 g  (05/30/19 2126)  . [START ON 06/01/2019] vancomycin      Assessment/Plan: Oral bleeding has stopped. The source of the bleeding is unclear. - Conservative observation for now. - Will follow.   LOS: 1 day   Lorie Melichar W Derriona Branscom 05/31/2019, 11:02 AM

## 2019-05-31 NOTE — Progress Notes (Signed)
Dressing to the right lower leg applied (aquacel) after cleaning two wounds with soap and water and patting dry. Both wound appear dry but red, lower legs are red.  The patient has been encouraged to keep legs elevated

## 2019-05-31 NOTE — Progress Notes (Signed)
Awaiting for IV team to restart due to needing IV antibiotics

## 2019-05-31 NOTE — Plan of Care (Signed)

## 2019-05-31 NOTE — Progress Notes (Signed)
PROGRESS NOTE    Samantha Bishop  [id number] DOB: Nov 29, 1924 DOA: 05/29/2019 PCP: Hoyt Koch, MD   Brief Narrative:  HPI On 05/30/2019 by Dr. Gean Birchwood Samantha Bishop is a 86 y.o. female with history of A. fib and previous stroke presents to the ER because of worsening right lower extremity pain and epistaxis.  Patient's right lower extremity pain started about 8 days ago and was placed on empiric antibiotics by primary care physician despite taking which patient's pain has not improved.  About 4 days ago patient started developing epistaxis and had come to the ER and was seen by ENT surgeon who had followed up in the clinic and had cauterization done 2 days ago.  Patient had followed up with her primary care physician yesterday for the right lower extremity pain during which patient started having epistaxis again and was referred to the ER.  Interim history Admitted for RLE cellulitis, on IV antibiotics. Also had bleeding noted, GI and ENT consulted, no source of bleeding found.   Assessment & Plan   Right lower extremity cellulitis -Patient presenting with erythema and edema of the lower extremity after failed outpatient oral antibiotics -Continue vancomycin and cefepime -Cellulitis has not improved much -Will restart gabapentin for pain control -Consult PT (patient lives alone)  Possible Bleed -Patient was initially admitted with epistaxis -She was on Eliquis which is now been held- since 05/24/19 -ENT consulted Dr.Teo-status post flexible fiberoptic laryngoscopy-showing no bleeding from nasal cavities or nasopharynx.  Blood clots noted in the hypopharynx on laryngoscopy. -Gastroenterology consulted and appreciated, s/p EGD: Normal esophagus, erythematous mucosa in the gastric fundus and gastric body.  Findings to mild to explain recent bleeding.  Normal duodenum.  No source of recent bleeding identified. -hemoglobin currently stable, 10.6 -Patient complain of  nasal/oral dryness, will place on nasal saline  History of atrial fibrillation -Eliquis currently held  Essential hypertension -BP currently stable -Continue IV hydralazine as needed, HCTZ currently held  Normocytic Anemia -Hemoglobin currently stable 10.6, continue to monitor CBC  History of CVA -Continue statin, Eliquis currently held  DVT Prophylaxis SCDs  Code Status: Full  Family Communication: None at bedside. Son via phone.  Disposition Plan: Admitted.  Pending further evaluation of bleeding as well as treatment of right lower extremity cellulitis.  Disposition TBD  Consultants ENT, Dr. Melene Plan Gastroenterology  Procedures  Flexible fiberoptic laryngoscopy EGD  Antibiotics   Anti-infectives (From admission, onward)   Start     Dose/Rate Route Frequency Ordered Stop   06/01/19 0900  vancomycin (VANCOREADY) IVPB 750 mg/150 mL     750 mg 150 mL/hr over 60 Minutes Intravenous Every 24 hours 05/31/19 0937     05/31/19 0800  vancomycin (VANCOREADY) IVPB 1250 mg/250 mL  Status:  Discontinued     1,250 mg 166.7 mL/hr over 90 Minutes Intravenous Every 24 hours 05/30/19 0604 05/31/19 0937   05/30/19 0800  ceFEPIme (MAXIPIME) 2 g in sodium chloride 0.9 % 100 mL IVPB     2 g 200 mL/hr over 30 Minutes Intravenous Every 12 hours 05/30/19 0625     05/30/19 0145  vancomycin (VANCOREADY) IVPB 1500 mg/300 mL     1,500 mg 150 mL/hr over 120 Minutes Intravenous  Once 05/30/19 0143 05/30/19 0435      Subjective:   Samantha Bishop seen and examined today.  Patient denies further bleeding, but complains of nasal/throat dryness. Denies chest pain, shortness of breath, abdominal pain, nausea or vomiting, diarrhea or constipation.  Continues to  have right leg pain and feels that her skin is being pulled off of her body.    Objective:   Vitals:   05/30/19 1943 05/30/19 2120 05/31/19 0335 05/31/19 0818  BP: (!) 138/58  (!) 146/70 137/61  Pulse: 79  80 86  Resp: 17  17 16   Temp: 98.7  F (37.1 C)  98.7 F (37.1 C) 98.5 F (36.9 C)  TempSrc: Oral  Oral Oral  SpO2: 96%  96% 91%  Weight:  84.8 kg    Height:  5\' 6"  (1.676 m)      Intake/Output Summary (Last 24 hours) at 05/31/2019 1053 Last data filed at 05/31/2019 0500 Gross per 24 hour  Intake 200 ml  Output -  Net 200 ml   Filed Weights   05/30/19 2120  Weight: 84.8 kg   Exam  General: Well developed, well nourished, NAD, appears stated age  45: NCAT, mucous membranes moist.   Cardiovascular: S1 S2 auscultated, irregular  Respiratory: Clear to auscultation bilaterally   Abdomen: Soft, nontender, nondistended, + bowel sounds  Extremities: warm dry without cyanosis clubbing. +Edema RLE with erythema  Neuro: AAOx3, nonfocal  Psych: Appropriate mood and affect, pleasant   Data Reviewed: I have personally reviewed following labs and imaging studies  CBC: Recent Labs  Lab 05/29/19 2241 05/30/19 0700 05/31/19 0323  WBC 9.6 7.8 8.4  NEUTROABS 6.0 5.1  --   HGB 11.9* 11.3* 10.6*  HCT 37.5 34.5* 33.6*  MCV 90.1 88.7 88.2  PLT 240 218 [id number]   Basic Metabolic Panel: Recent Labs  Lab 05/29/19 2241 05/30/19 0700 05/31/19 0323  NA 137 138 139  K 3.9 3.6 4.1  CL 102 103 106  CO2 24 24 23   GLUCOSE 117* 113* 138*  BUN 13 11 14   CREATININE 0.65 0.60 0.60  CALCIUM 9.8 9.1 9.1   GFR: Estimated Creatinine Clearance: 49.2 mL/min (by C-G formula based on SCr of 0.6 mg/dL). Liver Function Tests: No results for input(s): AST, ALT, ALKPHOS, BILITOT, PROT, ALBUMIN in the last 168 hours. No results for input(s): LIPASE, AMYLASE in the last 168 hours. No results for input(s): AMMONIA in the last 168 hours. Coagulation Profile: No results for input(s): INR, PROTIME in the last 168 hours. Cardiac Enzymes: No results for input(s): CKTOTAL, CKMB, CKMBINDEX, TROPONINI in the last 168 hours. BNP (last 3 results) No results for input(s): PROBNP in the last 8760 hours. HbA1C: No results for input(s):  HGBA1C in the last 72 hours. CBG: No results for input(s): GLUCAP in the last 168 hours. Lipid Profile: No results for input(s): CHOL, HDL, LDLCALC, TRIG, CHOLHDL, LDLDIRECT in the last 72 hours. Thyroid Function Tests: No results for input(s): TSH, T4TOTAL, FREET4, T3FREE, THYROIDAB in the last 72 hours. Anemia Panel: No results for input(s): VITAMINB12, FOLATE, FERRITIN, TIBC, IRON, RETICCTPCT in the last 72 hours. Urine analysis:    Component Value Date/Time   COLORURINE YELLOW 06/26/2016 1536   APPEARANCEUR CLOUDY (A) 06/26/2016 1536   LABSPEC 1.005 06/26/2016 1536   PHURINE 6.0 06/26/2016 1536   GLUCOSEU NEGATIVE 06/26/2016 1536   HGBUR LARGE (A) 06/26/2016 1536   BILIRUBINUR neg 07/15/2016 1649   KETONESUR NEGATIVE 06/26/2016 1536   PROTEINUR 15mg  07/15/2016 1649   PROTEINUR NEGATIVE 06/26/2016 1536   UROBILINOGEN 0.2 07/15/2016 1649   UROBILINOGEN 0.2 08/20/2011 1151   NITRITE neg 07/15/2016 1649   NITRITE POSITIVE (A) 06/26/2016 1536   LEUKOCYTESUR Negative 07/15/2016 1649   Sepsis Labs: @LABRCNTIP (procalcitonin:4,lacticidven:4)  ) Recent Results (from the  past 240 hour(s))  Culture, blood (Routine X 2) w Reflex to ID Panel     Status: None (Preliminary result)   Collection Time: 05/30/19  1:31 AM   Specimen: BLOOD  Result Value Ref Range Status   Specimen Description BLOOD RIGHT ARM  Final   Special Requests   Final    BOTTLES DRAWN AEROBIC AND ANAEROBIC Blood Culture adequate volume   Culture   Final    NO GROWTH 1 DAY Performed at [facility], [place] [address], [place], [place] [address]    Report Status PENDING  Incomplete  SARS CORONAVIRUS 2 (TAT 6-24 HRS) Nasopharyngeal Nasopharyngeal Swab     Status: None   Collection Time: 05/30/19  1:33 AM   Specimen: Nasopharyngeal Swab  Result Value Ref Range Status   SARS Coronavirus 2 NEGATIVE NEGATIVE Final    Comment: (NOTE) SARS-CoV-2 target nucleic acids are NOT DETECTED. The SARS-CoV-2 RNA is  generally detectable in upper and lower respiratory specimens during the acute phase of infection. Negative results do not preclude SARS-CoV-2 infection, do not rule out co-infections with other pathogens, and should not be used as the sole basis for treatment or other patient management decisions. Negative results must be combined with clinical observations, patient history, and epidemiological information. The expected result is Negative. Fact Sheet for Patients: SugarRoll.be Fact Sheet for Healthcare Providers: https://www.woods-mathews.com/ This test is not yet approved or cleared by the [place] FDA and  has been authorized for detection and/or diagnosis of SARS-CoV-2 by FDA under an Emergency Use Authorization (EUA). This EUA will remain  in effect (meaning this test can be used) for the duration of the COVID-19 declaration under Section 56 4(b)(1) of the Act, 21 U.S.C. section 360bbb-3(b)(1), unless the authorization is terminated or revoked sooner. Performed at [facility], [place] [address], [place], [place] [address]   Culture, blood (Routine X 2) w Reflex to ID Panel     Status: None (Preliminary result)   Collection Time: 05/30/19  5:21 AM   Specimen: BLOOD  Result Value Ref Range Status   Specimen Description BLOOD RIGHT HAND  Final   Special Requests   Final    BOTTLES DRAWN AEROBIC AND ANAEROBIC Blood Culture adequate volume   Culture   Final    NO GROWTH 1 DAY Performed at [facility], [place] [address], [place], [place] [address]    Report Status PENDING  Incomplete      Radiology Studies: DG Tibia/Fibula Right  Result Date: 05/30/2019 CLINICAL DATA:  Infection. Cellulitis. EXAM: RIGHT TIBIA AND FIBULA - 2 VIEW COMPARISON:  May 29, 2007 FINDINGS: The patient is status post total knee arthroplasty. There is nonspecific subcutaneous edema involving the right lower extremity. Multiple calcifications are  noted in the subcutaneous soft tissues which may related to chronic venous insufficiency. There is no radiographic evidence for osteomyelitis. There is no acute displaced fracture. There are no pockets of subcutaneous gas. IMPRESSION: 1. No radiographic evidence for osteomyelitis. 2. Nonspecific subcutaneous edema involving the right lower extremity. This can be seen in patients with cellulitis in the appropriate clinical setting. 3. Soft tissue calcifications, likely related to chronic venous insufficiency. Electronically Signed   By: Constance Holster M.D.   On: 05/30/2019 00:55     Scheduled Meds: . gabapentin  400 mg Oral BID  . pravastatin  20 mg Oral q1800   Continuous Infusions: . ceFEPime (MAXIPIME) IV 2 g (05/30/19 2126)  . [START ON 06/01/2019] vancomycin  LOS: 1 day   Time Spent in minutes   45 minutes  Kavari Parrillo D.O. on 05/31/2019 at 10:53 AM  Between 7am to 7pm - Please see pager noted on [email or web address]  After 7pm go to [email or web address]  And look for the night coverage person covering for me after hours  [facility] Office  [phone]

## 2019-05-31 NOTE — Evaluation (Signed)
Physical Therapy Evaluation Patient Details Name: Samantha Bishop MRN: [id number] DOB: 23-May-1924 Today's Date: 05/31/2019   History of Present Illness  Pt is a 86 y/o female admitted secondary to RLE pain and epistaxis. Pt found to have RLE cellulitis. Pt is s/p EGD, however, showed no evidence for recent GI bleed. PMH includes a fib, CVA, HTN, and L TKA.   Clinical Impression  Pt admitted secondary to problem above with deficits below. Pt requiring mod A to stand with RW, and only able to take 1-2 steps at EOB secondary to increased pain in RLE. Pt reports living alone and reports she has family that visits, but cannot stay with her. Pt was previously independent. Feel pt would benefit from SNF level therapies, however, may progress well if pain is controlled. Pt reports she would prefer to go home. Will continue to follow acutely to maximize functional mobility independence and safety.     Follow Up Recommendations SNF;Supervision for mobility/OOB(If pt refuses, will likely require max HH services)    Equipment Recommendations  Rolling walker with 5" wheels    Recommendations for Other Services       Precautions / Restrictions Precautions Precautions: Fall Restrictions Weight Bearing Restrictions: No      Mobility  Bed Mobility Overal bed mobility: Needs Assistance Bed Mobility: Supine to Sit;Sit to Supine     Supine to sit: Min assist Sit to supine: Supervision   General bed mobility comments: Min A for trunk elevation to come to sitting. Supervision for safety to return to supine.   Transfers Overall transfer level: Needs assistance Equipment used: Rolling walker (2 wheeled) Transfers: Sit to/from Stand Sit to Stand: Mod assist         General transfer comment: Mod A for lift assist and steadying. Cues for safe hand placement. Limited weightshift to RLE secondary to pain.   Ambulation/Gait Ambulation/Gait assistance: Min assist   Assistive device: Rolling  walker (2 wheeled)   Gait velocity: Decreased   General Gait Details: Took 1-2 side steps at EOB, however, pt unable to tolerate further mobility secondary to pain in RLE. Pt with increased difficulty bearing weight on RLE secondary to pain.   Stairs            Wheelchair Mobility    Modified Rankin (Stroke Patients Only)       Balance Overall balance assessment: Needs assistance Sitting-balance support: No upper extremity supported;Feet supported Sitting balance-Leahy Scale: Fair     Standing balance support: Bilateral upper extremity supported;During functional activity Standing balance-Leahy Scale: Poor Standing balance comment: Reliant on UE and external support.                              Pertinent Vitals/Pain Pain Assessment: Faces Faces Pain Scale: Hurts whole lot Pain Location: RLE  Pain Descriptors / Indicators: Grimacing;Guarding;Moaning Pain Intervention(s): Monitored during session;Limited activity within patient's tolerance;Repositioned    Home Living Family/patient expects to be discharged to:: Private residence Living Arrangements: Alone Available Help at Discharge: Family;Available PRN/intermittently Type of Home: House Home Access: Stairs to enter Entrance Stairs-Rails: Left;Right;Can reach both Entrance Stairs-Number of Steps: 4 Home Layout: One level Home Equipment: Walker - 4 wheels;Bedside commode      Prior Function Level of Independence: Independent         Comments: Pt reports son grocery shops for her. She is otherwise independent and likes to garden.      Hand Dominance  Extremity/Trunk Assessment   Upper Extremity Assessment Upper Extremity Assessment: Defer to OT evaluation    Lower Extremity Assessment Lower Extremity Assessment: Generalized weakness;RLE deficits/detail RLE Deficits / Details: Increased redness at R ankle. Difficulty bearing weight secondary to pain.     Cervical / Trunk  Assessment Cervical / Trunk Assessment: Kyphotic  Communication   Communication: HOH  Cognition Arousal/Alertness: Awake/alert Behavior During Therapy: WFL for tasks assessed/performed Overall Cognitive Status: No family/caregiver present to determine baseline cognitive functioning                                        General Comments General comments (skin integrity, edema, etc.): Discussed SNF level therapies at d/c given current mobility limitations and decreased assist, however, pt reports she prefers to go home.     Exercises     Assessment/Plan    PT Assessment Patient needs continued PT services  PT Problem List Decreased activity tolerance;Decreased mobility;Decreased balance;Decreased knowledge of use of DME;Decreased knowledge of precautions;Pain       PT Treatment Interventions Gait training;DME instruction;Stair training;Functional mobility training;Therapeutic activities;Therapeutic exercise;Balance training;Patient/family education    PT Goals (Current goals can be found in the Care Plan section)  Acute Rehab PT Goals Patient Stated Goal: to decrease pain  PT Goal Formulation: With patient Time For Goal Achievement: 06/14/19 Potential to Achieve Goals: Fair    Frequency Min 3X/week   Barriers to discharge Decreased caregiver support Lives alone and reports family unable to stay with her.     Co-evaluation               AM-PAC PT "6 Clicks" Mobility  Outcome Measure Help needed turning from your back to your side while in a flat bed without using bedrails?: A Little Help needed moving from lying on your back to sitting on the side of a flat bed without using bedrails?: A Little Help needed moving to and from a bed to a chair (including a wheelchair)?: A Lot Help needed standing up from a chair using your arms (e.g., wheelchair or bedside chair)?: A Lot Help needed to walk in hospital room?: A Lot Help needed climbing 3-5 steps with a  railing? : A Lot 6 Click Score: 14    End of Session Equipment Utilized During Treatment: Gait belt Activity Tolerance: Patient limited by pain Patient left: in bed;with call bell/phone within reach;with bed alarm set Nurse Communication: Mobility status PT Visit Diagnosis: Difficulty in walking, not elsewhere classified (R26.2);Pain Pain - Right/Left: Right Pain - part of body: Leg    Time: [id number] PT Time Calculation (min) (ACUTE ONLY): 20 min   Charges:   PT Evaluation $PT Eval Moderate Complexity: 1 Mod          Reuel Derby, PT, DPT  Acute Rehabilitation Services  Pager: [phone] Office: [phone]   Rudean Hitt 05/31/2019, 6:07 PM

## 2019-06-01 LAB — BASIC METABOLIC PANEL
Anion gap: 7 (ref 5–15)
BUN: 11 mg/dL (ref 8–23)
CO2: 25 mmol/L (ref 22–32)
Calcium: 8.8 mg/dL — ABNORMAL LOW (ref 8.9–10.3)
Chloride: 106 mmol/L (ref 98–111)
Creatinine, Ser: 0.56 mg/dL (ref 0.44–1.00)
GFR calc Af Amer: >60 mL/min (ref >60)
GFR calc non Af Amer: >60 mL/min (ref >60)
Glucose, Bld: 108 mg/dL — ABNORMAL HIGH (ref 70–99)
Potassium: 3.9 mmol/L (ref 3.5–5.1)
Sodium: 138 mmol/L (ref 135–145)

## 2019-06-01 LAB — HEMOGLOBIN AND HEMATOCRIT, BLOOD
HCT: 33.9 % — ABNORMAL LOW (ref 36.0–46.0)
Hemoglobin: 10.7 g/dL — ABNORMAL LOW (ref 12.0–15.0)

## 2019-06-01 MED ORDER — GABAPENTIN 300 MG PO CAPS
300.0000 mg | ORAL_CAPSULE | Freq: Three times a day (TID) | ORAL | Status: DC
  Administered 2019-06-01 – 2019-06-06 (×16): 300 mg via ORAL
  Filled 2019-06-01 (×17): qty 1

## 2019-06-01 NOTE — Plan of Care (Signed)
?  Problem: Education: ?Goal: Knowledge of General Education information will improve ?Description: Including pain rating scale, medication(s)/side effects and non-pharmacologic comfort measures ?Outcome: Progressing ?  ?Problem: Health Behavior/Discharge Planning: ?Goal: Ability to manage health-related needs will improve ?Outcome: Progressing ?  ?Problem: Clinical Measurements: ?Goal: Diagnostic test results will improve ?Outcome: Progressing ?Goal: Cardiovascular complication will be avoided ?Outcome: Progressing ?  ?

## 2019-06-01 NOTE — Progress Notes (Addendum)
PROGRESS NOTE    Samantha Bishop  [id number] DOB: 1925-02-15 DOA: 05/29/2019 PCP: Hoyt Koch, MD   Brief Narrative:  HPI On 05/30/2019 by Dr. Gean Birchwood Samantha Bishop is a 86 y.o. female with history of A. fib and previous stroke presents to the ER because of worsening right lower extremity pain and epistaxis.  Patient's right lower extremity pain started about 8 days ago and was placed on empiric antibiotics by primary care physician despite taking which patient's pain has not improved.  About 4 days ago patient started developing epistaxis and had come to the ER and was seen by ENT surgeon who had followed up in the clinic and had cauterization done 2 days ago.  Patient had followed up with her primary care physician yesterday for the right lower extremity pain during which patient started having epistaxis again and was referred to the ER.  Interim history Admitted for RLE cellulitis, on IV antibiotics. Also had bleeding noted, GI and ENT consulted, no source of bleeding found.   Assessment & Plan   Right lower extremity cellulitis -Patient presenting with erythema and edema of the lower extremity after failed outpatient oral antibiotics -Continue vancomycin and cefepime -Cellulitis improving slowly -Continue gabapentin - will increase dosage slightly, 300mg  TID -Continue morphine PRN -PT recommended SNF- however patient refuses -OT pending   Possible Bleed -Patient was initially admitted with epistaxis -She was on Eliquis which is now been held- since 05/24/19  -ENT consulted Dr.Teo-status post flexible fiberoptic laryngoscopy-showing no bleeding from nasal cavities or nasopharynx.  Blood clots noted in the hypopharynx on laryngoscopy. -Gastroenterology consulted and appreciated, s/p EGD: Normal esophagus, erythematous mucosa in the gastric fundus and gastric body.  Findings to mild to explain recent bleeding.  Normal duodenum.  No source of recent bleeding  identified. -hemoglobin currently stable, 10.7 -Patient complain of nasal/oral dryness, continue nasal saline  History of atrial fibrillation -Eliquis currently held  Essential hypertension -BP currently stable -Continue IV hydralazine as needed, HCTZ currently held  Normocytic Anemia -Hemoglobin currently stable 10.7, continue to monitor CBC  History of CVA -Continue statin, Eliquis currently held  DVT Prophylaxis SCDs  Code Status: Full  Family Communication: None at bedside. Son via phone on 1/21  Disposition Plan: Admitted.  Pending further evaluation of bleeding as well as treatment of right lower extremity cellulitis.  Disposition TBD  Consultants ENT, Dr. Melene Plan Gastroenterology  Procedures  Flexible fiberoptic laryngoscopy EGD  Antibiotics   Anti-infectives (From admission, onward)   Start     Dose/Rate Route Frequency Ordered Stop   06/01/19 0900  vancomycin (VANCOREADY) IVPB 750 mg/150 mL     750 mg 150 mL/hr over 60 Minutes Intravenous Every 24 hours 05/31/19 0937     05/31/19 0800  vancomycin (VANCOREADY) IVPB 1250 mg/250 mL  Status:  Discontinued     1,250 mg 166.7 mL/hr over 90 Minutes Intravenous Every 24 hours 05/30/19 0604 05/31/19 0937   05/30/19 0800  ceFEPIme (MAXIPIME) 2 g in sodium chloride 0.9 % 100 mL IVPB     2 g 200 mL/hr over 30 Minutes Intravenous Every 12 hours 05/30/19 0625     05/30/19 0145  vancomycin (VANCOREADY) IVPB 1500 mg/300 mL     1,500 mg 150 mL/hr over 120 Minutes Intravenous  Once 05/30/19 0143 05/30/19 0435      Subjective:   Samantha Bishop seen and examined today.  Patient complains of burning pain in her leg.  She is also upset that her son could  not come to visit her yesterday, states that he was not letting.  She currently denies chest pain or shortness of breath, abdominal pain, nausea or vomiting, diarrhea or constipation, dizziness or headache.   Objective:   Vitals:   05/31/19 1325 05/31/19 1929 06/01/19 0408  06/01/19 0823  BP: 109/60 (!) 149/71 (!) 147/63 (!) 153/71  Pulse: 73 82 75 69  Resp: 18 16 15 18   Temp: 98.8 F (37.1 C) 98.3 F (36.8 C) 98.5 F (36.9 C) 98.3 F (36.8 C)  TempSrc: Oral Oral Oral   SpO2: 95% 99% 98% 97%  Weight:      Height:       No intake or output data in the 24 hours ending 06/01/19 0940 Filed Weights   05/30/19 2120  Weight: 84.8 kg   Exam  General: Well developed, well nourished, NAD  HEENT: NCAT, mucous membranes moist.   Cardiovascular: S1 S2 auscultated, irregular  Respiratory: Clear to auscultation bilaterally   Abdomen: Soft, nontender, nondistended, + bowel sounds  Extremities: warm dry without cyanosis clubbing. RLE edema with erythema- dressing currently in place  Neuro: AAOx3, nonfocal  Psych: Appropriate mood and affect  Data Reviewed: I have personally reviewed following labs and imaging studies  CBC: Recent Labs  Lab 05/29/19 2241 05/30/19 0700 05/31/19 0323 06/01/19 0507  WBC 9.6 7.8 8.4  --   NEUTROABS 6.0 5.1  --   --   HGB 11.9* 11.3* 10.6* 10.7*  HCT 37.5 34.5* 33.6* 33.9*  MCV 90.1 88.7 88.2  --   PLT 240 218 211  --    Basic Metabolic Panel: Recent Labs  Lab 05/29/19 2241 05/30/19 0700 05/31/19 0323 06/01/19 0507  NA 137 138 139 138  K 3.9 3.6 4.1 3.9  CL 102 103 106 106  CO2 24 24 23 25   GLUCOSE 117* 113* 138* 108*  BUN 13 11 14 11   CREATININE 0.65 0.60 0.60 0.56  CALCIUM 9.8 9.1 9.1 8.8*   GFR: Estimated Creatinine Clearance: 49.2 mL/min (by C-G formula based on SCr of 0.56 mg/dL). Liver Function Tests: No results for input(s): AST, ALT, ALKPHOS, BILITOT, PROT, ALBUMIN in the last 168 hours. No results for input(s): LIPASE, AMYLASE in the last 168 hours. No results for input(s): AMMONIA in the last 168 hours. Coagulation Profile: No results for input(s): INR, PROTIME in the last 168 hours. Cardiac Enzymes: No results for input(s): CKTOTAL, CKMB, CKMBINDEX, TROPONINI in the last 168 hours. BNP  (last 3 results) No results for input(s): PROBNP in the last 8760 hours. HbA1C: No results for input(s): HGBA1C in the last 72 hours. CBG: No results for input(s): GLUCAP in the last 168 hours. Lipid Profile: No results for input(s): CHOL, HDL, LDLCALC, TRIG, CHOLHDL, LDLDIRECT in the last 72 hours. Thyroid Function Tests: No results for input(s): TSH, T4TOTAL, FREET4, T3FREE, THYROIDAB in the last 72 hours. Anemia Panel: No results for input(s): VITAMINB12, FOLATE, FERRITIN, TIBC, IRON, RETICCTPCT in the last 72 hours. Urine analysis:    Component Value Date/Time   COLORURINE YELLOW 06/26/2016 1536   APPEARANCEUR CLOUDY (A) 06/26/2016 1536   LABSPEC 1.005 06/26/2016 1536   PHURINE 6.0 06/26/2016 1536   GLUCOSEU NEGATIVE 06/26/2016 1536   HGBUR LARGE (A) 06/26/2016 1536   BILIRUBINUR neg 07/15/2016 1649   KETONESUR NEGATIVE 06/26/2016 1536   PROTEINUR 15mg  07/15/2016 1649   PROTEINUR NEGATIVE 06/26/2016 1536   UROBILINOGEN 0.2 07/15/2016 1649   UROBILINOGEN 0.2 08/20/2011 1151   NITRITE neg 07/15/2016 1649   NITRITE  POSITIVE (A) 06/26/2016 1536   LEUKOCYTESUR Negative 07/15/2016 1649   Sepsis Labs: @LABRCNTIP (procalcitonin:4,lacticidven:4)  ) Recent Results (from the past 240 hour(s))  Culture, blood (Routine X 2) w Reflex to ID Panel     Status: None (Preliminary result)   Collection Time: 05/30/19  1:31 AM   Specimen: BLOOD  Result Value Ref Range Status   Specimen Description BLOOD RIGHT ARM  Final   Special Requests   Final    BOTTLES DRAWN AEROBIC AND ANAEROBIC Blood Culture adequate volume   Culture   Final    NO GROWTH 1 DAY Performed at [facility], [place] [address], [place], [place] [address]    Report Status PENDING  Incomplete  SARS CORONAVIRUS 2 (TAT 6-24 HRS) Nasopharyngeal Nasopharyngeal Swab     Status: None   Collection Time: 05/30/19  1:33 AM   Specimen: Nasopharyngeal Swab  Result Value Ref Range Status   SARS Coronavirus 2 NEGATIVE NEGATIVE  Final    Comment: (NOTE) SARS-CoV-2 target nucleic acids are NOT DETECTED. The SARS-CoV-2 RNA is generally detectable in upper and lower respiratory specimens during the acute phase of infection. Negative results do not preclude SARS-CoV-2 infection, do not rule out co-infections with other pathogens, and should not be used as the sole basis for treatment or other patient management decisions. Negative results must be combined with clinical observations, patient history, and epidemiological information. The expected result is Negative. Fact Sheet for Patients: SugarRoll.be Fact Sheet for Healthcare Providers: https://www.woods-mathews.com/ This test is not yet approved or cleared by the [place] FDA and  has been authorized for detection and/or diagnosis of SARS-CoV-2 by FDA under an Emergency Use Authorization (EUA). This EUA will remain  in effect (meaning this test can be used) for the duration of the COVID-19 declaration under Section 56 4(b)(1) of the Act, 21 U.S.C. section 360bbb-3(b)(1), unless the authorization is terminated or revoked sooner. Performed at [facility], [place] [address], [place], [place] [address]   Culture, blood (Routine X 2) w Reflex to ID Panel     Status: None (Preliminary result)   Collection Time: 05/30/19  5:21 AM   Specimen: BLOOD  Result Value Ref Range Status   Specimen Description BLOOD RIGHT HAND  Final   Special Requests   Final    BOTTLES DRAWN AEROBIC AND ANAEROBIC Blood Culture adequate volume   Culture   Final    NO GROWTH 1 DAY Performed at [facility], [place] [address] [place], [place] [address]    Report Status PENDING  Incomplete      Radiology Studies: No results found.   Scheduled Meds: . gabapentin  400 mg Oral BID  . pravastatin  20 mg Oral q1800   Continuous Infusions: . ceFEPime (MAXIPIME) IV 2 g (05/31/19 2130)  . vancomycin       LOS: 2 days   Time Spent  in minutes   45 minutes  Tawan Degroote D.O. on 06/01/2019 at 9:40 AM  Between 7am to 7pm - Please see pager noted on [email or web address]  After 7pm go to [email or web address]  And look for the night coverage person covering for me after hours  [facility] Office  [phone]

## 2019-06-01 NOTE — Progress Notes (Signed)
Physical Therapy Treatment Patient Details Name: Samantha Bishop MRN: [id number] DOB: 05-26-1924 Today's Date: 06/01/2019    History of Present Illness Pt is a 86 y/o female admitted secondary to RLE pain and epistaxis. Pt found to have RLE cellulitis. Pt is s/p EGD, however, showed no evidence for recent GI bleed. PMH includes a fib, CVA, HTN, and L TKA.     PT Comments    Pt is showing progress towards goals. She increased ambulation distance today. Son was present and involved in d/c discussion with pt. Pt seems more agreeable to SNF today after speaking with son. On arrival to room, pt reported her nose had been bleeding significantly and had a small basin of bloody sputum in front of her. She seems more concerned with her nose bleeds than her LE cellulitis. Patient would benefit from continued skilled PT to maximize functional independence and safety with mobility. Will continue to follow acutely.     Follow Up Recommendations  SNF;Supervision for mobility/OOB(If pt refuses, will likely require max HH services)     Equipment Recommendations  Rolling walker with 5" wheels    Recommendations for Other Services       Precautions / Restrictions Precautions Precautions: Fall Restrictions Weight Bearing Restrictions: No    Mobility  Bed Mobility Overal bed mobility: Needs Assistance             General bed mobility comments: in recliner on arrival  Transfers Overall transfer level: Needs assistance Equipment used: Rolling walker (2 wheeled) Transfers: Sit to/from Stand Sit to Stand: Min assist         General transfer comment: min a for lift assist from recliner chair with use of arm rests  Ambulation/Gait Ambulation/Gait assistance: Min assist Gait Distance (Feet): 75 Feet Assistive device: Rolling walker (2 wheeled) Gait Pattern/deviations: Step-through pattern;Decreased stride length;Decreased stance time - right;Decreased step length - left;Narrow base of  support;Trunk flexed Gait velocity: Decreased   General Gait Details: Pt ambulated in hallway. mildly antalgic gait secondary to pain in R LE   Stairs             Wheelchair Mobility    Modified Rankin (Stroke Patients Only)       Balance Overall balance assessment: Needs assistance Sitting-balance support: No upper extremity supported;Feet supported Sitting balance-Leahy Scale: Fair     Standing balance support: Bilateral upper extremity supported;During functional activity Standing balance-Leahy Scale: Poor Standing balance comment: Reliant on UE and external support.                             Cognition Arousal/Alertness: Awake/alert Behavior During Therapy: WFL for tasks assessed/performed Overall Cognitive Status: Within Functional Limits for tasks assessed Area of Impairment: Attention;Memory;Safety/judgement                     Memory: Decreased short-term memory   Safety/Judgement: Decreased awareness of safety            Exercises Total Joint Exercises Ankle Circles/Pumps: AROM;Both;10 reps;Seated    General Comments General comments (skin integrity, edema, etc.): Son present for session and participated in d/c discussion. Pt seems more likely to agree to SNF.      Pertinent Vitals/Pain Pain Assessment: Faces Pain Score: 6  Faces Pain Scale: Hurts little more Pain Location: RLE  Pain Descriptors / Indicators: Grimacing;Guarding;Tightness;Pins and needles Pain Intervention(s): Monitored during session;Limited activity within patient's tolerance;Repositioned    Home Living Family/patient expects  to be discharged to:: Private residence Living Arrangements: Alone Available Help at Discharge: Family;Available PRN/intermittently Type of Home: House Home Access: Stairs to enter Entrance Stairs-Rails: Left;Right;Can reach both Home Layout: One level Home Equipment: Environmental consultant - 4 wheels;Bedside commode      Prior Function  Level of Independence: Independent      Comments: Pt reports son grocery shops for her. She is otherwise independent and likes to garden.    PT Goals (current goals can now be found in the care plan section) Acute Rehab PT Goals Patient Stated Goal: to decrease pain  PT Goal Formulation: With patient Time For Goal Achievement: 06/14/19 Potential to Achieve Goals: Fair Progress towards PT goals: Progressing toward goals    Frequency    Min 3X/week      PT Plan Current plan remains appropriate    Co-evaluation              AM-PAC PT "6 Clicks" Mobility   Outcome Measure  Help needed turning from your back to your side while in a flat bed without using bedrails?: A Little Help needed moving from lying on your back to sitting on the side of a flat bed without using bedrails?: A Little Help needed moving to and from a bed to a chair (including a wheelchair)?: A Little Help needed standing up from a chair using your arms (e.g., wheelchair or bedside chair)?: A Little Help needed to walk in hospital room?: A Little Help needed climbing 3-5 steps with a railing? : A Lot 6 Click Score: 17    End of Session Equipment Utilized During Treatment: Gait belt Activity Tolerance: Patient tolerated treatment well Patient left: with call bell/phone within reach;in chair;with family/visitor present Nurse Communication: Mobility status PT Visit Diagnosis: Difficulty in walking, not elsewhere classified (R26.2);Pain Pain - Right/Left: Right Pain - part of body: Leg     Time: 1310-1335 PT Time Calculation (min) (ACUTE ONLY): 25 min  Charges:  $Gait Training: 23-37 mins                    Benjiman Core, [place] Pager [phone] Acute Rehab  Allena Katz 06/01/2019, 2:28 PM

## 2019-06-01 NOTE — Progress Notes (Signed)
Patient sat up in recliner for several hours today and tolerated well.  Patient was medicated for pain and dressing changed to the right lower extremity

## 2019-06-01 NOTE — Progress Notes (Signed)
Subjective: No bleeding yesterday.  She has been off eliquis. No new c/o. Objective: Vital signs in last 24 hours: Temp:  [98.3 F (36.8 C)-98.5 F (36.9 C)] 98.3 F (36.8 C) (01/22 0823) Pulse Rate:  [69-82] 69 (01/22 0823) Resp:  [15-18] 18 (01/22 0823) BP: (147-153)/(63-71) 153/71 (01/22 0823) SpO2:  [97 %-99 %] 97 % (01/22 0823)  Exam: General: Communicates without difficulty, well nourished, no acute distress.  Head: Normocephalic, no evidence injury, no tenderness, facial buttresses intact without stepoff.  Eyes: PERRL, EOMI. No scleral icterus, conjunctivae clear. Neuro: CN II exam reveals vision grossly intact. No nystagmus at any point of gaze.  Ears: Auricles well formed without lesions. Ear canals are intact without mass or lesion. No erythema or edema is appreciated.  Nose: External evaluation reveals normal support and skin without lesions. Dorsum is intact. Anterior rhinoscopy reveals healthy pink mucosa over anterior aspect of inferior turbinates and intact septum. No bleedingnoted.  Oral:Normal mucosa. No blood. Neck: Full range of motion without pain. There is no significant lymphadenopathy. No masses palpable. Thyroid bed within normal limits to palpation. Parotid glands and submandibular glands equal bilaterally without mass. Trachea is midline.  Neuro: CN 2-12 grossly intact.   Recent Labs    05/30/19 0700 05/30/19 0700 05/31/19 0323 06/01/19 0507  WBC 7.8  --  8.4  --   HGB 11.3*   < > 10.6* 10.7*  HCT 34.5*   < > 33.6* 33.9*  PLT 218  --  211  --    < > = values in this interval not displayed.   Recent Labs    05/31/19 0323 06/01/19 0507  NA 139 138  K 4.1 3.9  CL 106 106  CO2 23 25  GLUCOSE 138* 108*  BUN 14 11  CREATININE 0.60 0.56  CALCIUM 9.1 8.8*    Medications:  I have reviewed the patient's current medications. Scheduled: . gabapentin  300 mg Oral TID  . pravastatin  20 mg Oral q1800   Continuous: . ceFEPime  (MAXIPIME) IV 2 g (06/01/19 1200)  . vancomycin 750 mg (06/01/19 1000)   [id number] **OR** acetaminophen, hydrALAZINE, morphine injection, ondansetron **OR** ondansetron (ZOFRAN) IV, sodium chloride  Assessment/Plan: Oral bleeding has stopped. The source of the bleeding is still unclear. - Continue conservative observation for now. - May restart eliquis. - She may follow up in my office as an outpatient.   LOS: 2 days   Ariq Khamis W Estanislado Surgeon 06/01/2019, 1:59 PM

## 2019-06-01 NOTE — Evaluation (Signed)
Occupational Therapy Evaluation Patient Details Name: Samantha Bishop MRN: [id number] DOB: 1924/11/20 Today's Date: 06/01/2019    History of Present Illness Pt is a 86 y/o female admitted secondary to RLE pain and epistaxis. Pt found to have RLE cellulitis. Pt is s/p EGD, however, showed no evidence for recent GI bleed. PMH includes a fib, CVA, HTN, and L TKA.    Clinical Impression   Pt with decline in function and safety with ADLs and ADL mobility with impaired strength, balance and endurance. Pt live at home alone and was independent with ADLs/selfcare, home mgt, was driving and did not an AD for mobility. Pt states that her son gets her groceries, meds and provided transportation, but that she can drive. Pt currently requires mod A with LB ADLs, min guard A with grooming standing at sink, min A with toileting and mod - min A with mobility using RW. Pt would benefit from acute OT services to address impairments to maximize level of function and safety    Follow Up Recommendations  SNF(if refusing SNF, will need max [facility] services)    Equipment Recommendations  Tub/shower bench;Other (comment)(reacher)    Recommendations for Other Services       Precautions / Restrictions Precautions Precautions: Fall Restrictions Weight Bearing Restrictions: No      Mobility Bed Mobility               General bed mobility comments: pt in recliner upon arrival  Transfers Overall transfer level: Needs assistance Equipment used: Rolling walker (2 wheeled) Transfers: Sit to/from Stand Sit to Stand: Mod assist;Min assist         General transfer comment: Mod A for lift from recliner, min A from toilet. Cues for safe hand placement.    Balance Overall balance assessment: Needs assistance Sitting-balance support: No upper extremity supported;Feet supported Sitting balance-Leahy Scale: Fair     Standing balance support: Bilateral upper extremity supported;During functional  activity Standing balance-Leahy Scale: Poor                             ADL either performed or assessed with clinical judgement   ADL Overall ADL's : Needs assistance/impaired Eating/Feeding: Independent;Sitting   Grooming: Wash/dry hands;Wash/dry face;Oral care;Min guard;Standing   Upper Body Bathing: Set up;Sitting   Lower Body Bathing: Moderate assistance   Upper Body Dressing : Set up;Sitting   Lower Body Dressing: Moderate assistance   Toilet Transfer: Moderate assistance;Minimal assistance;Ambulation;RW;Cueing for safety;Cueing for sequencing;BSC;Regular Toilet;Grab bars   Toileting- Clothing Manipulation and Hygiene: Minimal assistance;Sit to/from stand       Functional mobility during ADLs: Moderate assistance;Minimal assistance;Cueing for safety;Cueing for sequencing;Rolling walker       Vision Patient Visual Report: No change from baseline       Perception     Praxis      Pertinent Vitals/Pain Pain Assessment: 0-10 Pain Score: 6  Pain Location: RLE  Pain Descriptors / Indicators: Grimacing;Guarding;Tightness;Pins and needles Pain Intervention(s): Monitored during session;Repositioned     Hand Dominance Right   Extremity/Trunk Assessment Upper Extremity Assessment Upper Extremity Assessment: Generalized weakness   Lower Extremity Assessment Lower Extremity Assessment: Defer to PT evaluation   Cervical / Trunk Assessment Cervical / Trunk Assessment: Kyphotic   Communication Communication Communication: HOH   Cognition Arousal/Alertness: Awake/alert Behavior During Therapy: WFL for tasks assessed/performed Overall Cognitive Status: No family/caregiver present to determine baseline cognitive functioning Area of Impairment: Attention;Memory;Safety/judgement  Memory: Decreased short-term memory   Safety/Judgement: Decreased awareness of safety         General Comments       Exercises     Shoulder  Instructions      Home Living Family/patient expects to be discharged to:: Private residence Living Arrangements: Alone Available Help at Discharge: Family;Available PRN/intermittently Type of Home: House Home Access: Stairs to enter [employer] of Steps: 4 Entrance Stairs-Rails: Left;Right;Can reach both Home Layout: One level     Bathroom Shower/Tub: Teacher, early years/pre: Standard     Home Equipment: Environmental consultant - 4 wheels;Bedside commode          Prior Functioning/Environment Level of Independence: Independent        Comments: Pt reports son grocery shops for her. She is otherwise independent and likes to garden.         OT Problem List: Decreased strength;Impaired balance (sitting and/or standing);Decreased cognition;Pain;Decreased activity tolerance;Decreased knowledge of use of DME or AE;Decreased safety awareness      OT Treatment/Interventions: Self-care/ADL training;DME and/or AE instruction;Therapeutic activities;Balance training;Therapeutic exercise;Patient/family education    OT Goals(Current goals can be found in the care plan section) Acute Rehab OT Goals Patient Stated Goal: to decrease pain  OT Goal Formulation: With patient Time For Goal Achievement: 06/15/19 Potential to Achieve Goals: Good ADL Goals Pt Will Perform Grooming: with supervision;with set-up;standing Pt Will Perform Lower Body Bathing: with min assist;with min guard assist;sit to/from stand Pt Will Perform Lower Body Dressing: with min assist;with min guard assist;sit to/from stand Pt Will Transfer to Toilet: with min assist;with min guard assist;ambulating Pt Will Perform Toileting - Clothing Manipulation and hygiene: with min guard assist;sit to/from stand  OT Frequency: Min 2X/week   Barriers to D/C:            Co-evaluation              AM-PAC OT "6 Clicks" Daily Activity     Outcome Measure Help from another person eating meals?: None Help from  another person taking care of personal grooming?: A Little Help from another person toileting, which includes using toliet, bedpan, or urinal?: A Little Help from another person bathing (including washing, rinsing, drying)?: A Lot Help from another person to put on and taking off regular upper body clothing?: None Help from another person to put on and taking off regular lower body clothing?: A Lot 6 Click Score: 18   End of Session Equipment Utilized During Treatment: Gait belt;Rolling walker  Activity Tolerance: Patient tolerated treatment well Patient left: in chair;with call bell/phone within reach;with chair alarm set  OT Visit Diagnosis: Unsteadiness on feet (R26.81);Other abnormalities of gait and mobility (R26.89);Muscle weakness (generalized) (M62.81);Pain;Other symptoms and signs involving cognitive function Pain - Right/Left: Right Pain - part of body: Leg                Time: 1030-1059 OT Time Calculation (min): 29 min Charges:  OT General Charges $OT Visit: 1 Visit OT Evaluation $OT Eval Moderate Complexity: 1 Mod OT Treatments $Self Care/Home Management : 8-22 mins    Britt Bottom 06/01/2019, 12:40 PM

## 2019-06-01 NOTE — Progress Notes (Signed)
The patient asked to use her saline nasal spray- afterwards, the patient co having burning sensation and feeling like she needed to blow her nose.  A small amount of bright red blood drainage noted on the the tissue after blowing her nose.

## 2019-06-02 LAB — HEMOGLOBIN AND HEMATOCRIT, BLOOD
HCT: 33.8 % — ABNORMAL LOW (ref 36.0–46.0)
Hemoglobin: 10.9 g/dL — ABNORMAL LOW (ref 12.0–15.0)

## 2019-06-02 MED ORDER — OXYCODONE HCL 5 MG PO TABS
5.0000 mg | ORAL_TABLET | ORAL | Status: DC | PRN
  Administered 2019-06-03 – 2019-06-05 (×6): 5 mg via ORAL
  Filled 2019-06-02 (×6): qty 1

## 2019-06-02 MED ORDER — METHOCARBAMOL 1000 MG/10ML IJ SOLN
500.0000 mg | Freq: Three times a day (TID) | INTRAVENOUS | Status: DC | PRN
  Filled 2019-06-02: qty 5

## 2019-06-02 MED ORDER — APIXABAN 5 MG PO TABS
5.0000 mg | ORAL_TABLET | Freq: Two times a day (BID) | ORAL | Status: DC
  Administered 2019-06-02 – 2019-06-06 (×9): 5 mg via ORAL
  Filled 2019-06-02 (×9): qty 1

## 2019-06-02 MED ORDER — POLYETHYLENE GLYCOL 3350 17 G PO PACK
17.0000 g | PACK | ORAL | Status: DC | PRN
  Administered 2019-06-05: 14:00:00 17 g via ORAL
  Filled 2019-06-02: qty 1

## 2019-06-02 MED ORDER — CYCLOSPORINE 0.05 % OP EMUL
1.0000 [drp] | OPHTHALMIC | Status: DC | PRN
  Filled 2019-06-02: qty 1

## 2019-06-02 MED ORDER — FLUTICASONE PROPIONATE 50 MCG/ACT NA SUSP
2.0000 | Freq: Every day | NASAL | Status: DC
  Administered 2019-06-03 – 2019-06-06 (×4): 2 via NASAL
  Filled 2019-06-02: qty 16

## 2019-06-02 NOTE — Plan of Care (Signed)

## 2019-06-02 NOTE — Progress Notes (Addendum)
PROGRESS NOTE    Samantha Bishop  [id number] DOB: [birth year] DOA: 05/29/2019 PCP: Hoyt Koch, MD   Brief Narrative:  HPI On 05/30/2019 by Dr. Gean Birchwood Samantha Bishop is a 86 y.o. female with history of A. fib and previous stroke presents to the ER because of worsening right lower extremity pain and epistaxis.  Patient's right lower extremity pain started about 8 days ago and was placed on empiric antibiotics by primary care physician despite taking which patient's pain has not improved.  About 4 days ago patient started developing epistaxis and had come to the ER and was seen by ENT surgeon who had followed up in the clinic and had cauterization done 2 days ago.  Patient had followed up with her primary care physician yesterday for the right lower extremity pain during which patient started having epistaxis again and was referred to the ER.  Interim history Admitted for RLE cellulitis, on IV antibiotics. Also had bleeding noted, GI and ENT consulted, no source of bleeding found.   Assessment & Plan   Right lower extremity cellulitis -Patient presenting with erythema and edema of the lower extremity after failed outpatient oral antibiotics -was placed on vancomycin and cefepime -Cellulitis improving slowly -Continue gabapentin - even with increase dose to 300mg  TID, patient continues to complain of leg pain and state she was pain overnight and unable to rest. -Continue morphine PRN -Started oxycodone and robaxin to hopefully help with pain -PT and OT recommended SNF- however patient refuses  Possible Bleed -Patient was initially admitted with epistaxis -She was on Eliquis which is now been held- since 05/24/19  -ENT consulted Dr.Teo-status post flexible fiberoptic laryngoscopy-showing no bleeding from nasal cavities or nasopharynx.  Blood clots noted in the hypopharynx on laryngoscopy. -Gastroenterology consulted and appreciated, s/p EGD: Normal esophagus,  erythematous mucosa in the gastric fundus and gastric body.  Findings to mild to explain recent bleeding.  Normal duodenum.  No source of recent bleeding identified. -hemoglobin currently stable, 10.9 -Patient complain of nasal/oral dryness, continue nasal saline -On 1/22, discussed with ENT, Dr. Benjamine Mola,- ok to restart Eliquis. Will restart Eliquis today and monitor   History of atrial fibrillation -will restart today Eliquis  Essential hypertension -BP currently stable -Continue IV hydralazine as needed, HCTZ currently held  Normocytic Anemia -Hemoglobin currently stable 10.9, continue to monitor CBC  History of CVA -Continue statin an Eliquis   DVT Prophylaxis SCDs  Code Status: Full  Family Communication: None at bedside. Son via phone on 1/21  Disposition Plan: Admitted.  Pending improvement of right lower extremity cellulitis and pain.  Disposition likely home with home health in 1-2 days.   Consultants ENT, Dr. Melene Plan Gastroenterology  Procedures  Flexible fiberoptic laryngoscopy EGD  Antibiotics   Anti-infectives (From admission, onward)   Start     Dose/Rate Route Frequency Ordered Stop   06/01/19 0900  vancomycin (VANCOREADY) IVPB 750 mg/150 mL     750 mg 150 mL/hr over 60 Minutes Intravenous Every 24 hours 05/31/19 0937     05/31/19 0800  vancomycin (VANCOREADY) IVPB 1250 mg/250 mL  Status:  Discontinued     1,250 mg 166.7 mL/hr over 90 Minutes Intravenous Every 24 hours 05/30/19 0604 05/31/19 0937   05/30/19 0800  ceFEPIme (MAXIPIME) 2 g in sodium chloride 0.9 % 100 mL IVPB     2 g 200 mL/hr over 30 Minutes Intravenous Every 12 hours 05/30/19 0625     05/30/19 0145  vancomycin (VANCOREADY) IVPB 1500 mg/300 mL  1,500 mg 150 mL/hr over 120 Minutes Intravenous  Once 05/30/19 0143 05/30/19 0435      Subjective:   Samantha Bishop seen and examined today.  Pains of throbbing pain in her leg.  States she was unable to sleep well overnight, and is very tearful  today.  Denies current chest pain or shortness of breath, abdominal pain, nausea or vomiting, diarrhea or constipation, dizziness or headache.  Denies any further bleeding.   Objective:   Vitals:   06/01/19 0823 06/01/19 1525 06/01/19 1951 06/02/19 0324  BP: (!) 153/71 131/63 (!) 115/97 (!) 161/66  Pulse: 69 78 84 83  Resp: 18 16 16 18   Temp: 98.3 F (36.8 C) 98.2 F (36.8 C) 98.5 F (36.9 C) 98.6 F (37 C)  TempSrc:   Oral Oral  SpO2: 97% 93% 99% 94%  Weight:      Height:        Intake/Output Summary (Last 24 hours) at 06/02/2019 1007 Last data filed at 06/02/2019 0400 Gross per 24 hour  Intake 0 ml  Output --  Net 0 ml   Filed Weights   05/30/19 2120  Weight: 84.8 kg   Exam  General: Well developed, well nourished, NAD, appears stated age  HEENT: NCAT, mucous membranes moist.   Cardiovascular: S1 S2 auscultated  Respiratory: Clear to auscultation bilaterally  Abdomen: Soft, nontender, nondistended, + bowel sounds  Extremities: warm dry without cyanosis clubbing. RLE edema and erythema improving, dressing place  Neuro: AAOx3, nonfocal  Psych: Tearful  Data Reviewed: I have personally reviewed following labs and imaging studies  CBC: Recent Labs  Lab 05/29/19 2241 05/30/19 0700 05/31/19 0323 06/01/19 0507 06/02/19 0318  WBC 9.6 7.8 8.4  --   --   NEUTROABS 6.0 5.1  --   --   --   HGB 11.9* 11.3* 10.6* 10.7* 10.9*  HCT 37.5 34.5* 33.6* 33.9* 33.8*  MCV 90.1 88.7 88.2  --   --   PLT 240 218 211  --   --    Basic Metabolic Panel: Recent Labs  Lab 05/29/19 2241 05/30/19 0700 05/31/19 0323 06/01/19 0507  NA 137 138 139 138  K 3.9 3.6 4.1 3.9  CL 102 103 106 106  CO2 24 24 23 25   GLUCOSE 117* 113* 138* 108*  BUN 13 11 14 11   CREATININE 0.65 0.60 0.60 0.56  CALCIUM 9.8 9.1 9.1 8.8*   GFR: Estimated Creatinine Clearance: 49.2 mL/min (by C-G formula based on SCr of 0.56 mg/dL). Liver Function Tests: No results for input(s): AST, ALT, ALKPHOS,  BILITOT, PROT, ALBUMIN in the last 168 hours. No results for input(s): LIPASE, AMYLASE in the last 168 hours. No results for input(s): AMMONIA in the last 168 hours. Coagulation Profile: No results for input(s): INR, PROTIME in the last 168 hours. Cardiac Enzymes: No results for input(s): CKTOTAL, CKMB, CKMBINDEX, TROPONINI in the last 168 hours. BNP (last 3 results) No results for input(s): PROBNP in the last 8760 hours. HbA1C: No results for input(s): HGBA1C in the last 72 hours. CBG: No results for input(s): GLUCAP in the last 168 hours. Lipid Profile: No results for input(s): CHOL, HDL, LDLCALC, TRIG, CHOLHDL, LDLDIRECT in the last 72 hours. Thyroid Function Tests: No results for input(s): TSH, T4TOTAL, FREET4, T3FREE, THYROIDAB in the last 72 hours. Anemia Panel: No results for input(s): VITAMINB12, FOLATE, FERRITIN, TIBC, IRON, RETICCTPCT in the last 72 hours. Urine analysis:    Component Value Date/Time   COLORURINE YELLOW 06/26/2016 1536  APPEARANCEUR CLOUDY (A) 06/26/2016 1536   LABSPEC 1.005 06/26/2016 1536   PHURINE 6.0 06/26/2016 1536   GLUCOSEU NEGATIVE 06/26/2016 1536   HGBUR LARGE (A) 06/26/2016 1536   BILIRUBINUR neg 07/15/2016 1649   KETONESUR NEGATIVE 06/26/2016 1536   PROTEINUR 15mg  07/15/2016 1649   PROTEINUR NEGATIVE 06/26/2016 1536   UROBILINOGEN 0.2 07/15/2016 1649   UROBILINOGEN 0.2 08/20/2011 1151   NITRITE neg 07/15/2016 1649   NITRITE POSITIVE (A) 06/26/2016 1536   LEUKOCYTESUR Negative 07/15/2016 1649   Sepsis Labs: @LABRCNTIP (procalcitonin:4,lacticidven:4)  ) Recent Results (from the past 240 hour(s))  Culture, blood (Routine X 2) w Reflex to ID Panel     Status: None (Preliminary result)   Collection Time: 05/30/19  1:31 AM   Specimen: BLOOD  Result Value Ref Range Status   Specimen Description BLOOD RIGHT ARM  Final   Special Requests   Final    BOTTLES DRAWN AEROBIC AND ANAEROBIC Blood Culture adequate volume   Culture   Final    NO  GROWTH 3 DAYS Performed at [facility], [place] [address], [place], [place] [address]    Report Status PENDING  Incomplete  SARS CORONAVIRUS 2 (TAT 6-24 HRS) Nasopharyngeal Nasopharyngeal Swab     Status: None   Collection Time: 05/30/19  1:33 AM   Specimen: Nasopharyngeal Swab  Result Value Ref Range Status   SARS Coronavirus 2 NEGATIVE NEGATIVE Final    Comment: (NOTE) SARS-CoV-2 target nucleic acids are NOT DETECTED. The SARS-CoV-2 RNA is generally detectable in upper and lower respiratory specimens during the acute phase of infection. Negative results do not preclude SARS-CoV-2 infection, do not rule out co-infections with other pathogens, and should not be used as the sole basis for treatment or other patient management decisions. Negative results must be combined with clinical observations, patient history, and epidemiological information. The expected result is Negative. Fact Sheet for Patients: SugarRoll.be Fact Sheet for Healthcare Providers: https://www.woods-mathews.com/ This test is not yet approved or cleared by the [place] FDA and  has been authorized for detection and/or diagnosis of SARS-CoV-2 by FDA under an Emergency Use Authorization (EUA). This EUA will remain  in effect (meaning this test can be used) for the duration of the COVID-19 declaration under Section 56 4(b)(1) of the Act, 21 U.S.C. section 360bbb-3(b)(1), unless the authorization is terminated or revoked sooner. Performed at [facility], [place] [address], [place],  [address]   Culture, blood (Routine X 2) w Reflex to ID Panel     Status: None (Preliminary result)   Collection Time: 05/30/19  5:21 AM   Specimen: BLOOD  Result Value Ref Range Status   Specimen Description BLOOD RIGHT HAND  Final   Special Requests   Final    BOTTLES DRAWN AEROBIC AND ANAEROBIC Blood Culture adequate volume   Culture   Final    NO GROWTH 3  DAYS Performed at [facility], [place] [address] [place],  [address]    Report Status PENDING  Incomplete      Radiology Studies: No results found.   Scheduled Meds: . apixaban  5 mg Oral BID  . fluticasone  2 spray Each Nare Daily  . gabapentin  300 mg Oral TID  . pravastatin  20 mg Oral q1800   Continuous Infusions: . ceFEPime (MAXIPIME) IV 2 g (06/01/19 2116)  . methocarbamol (ROBAXIN) IV    . vancomycin 750 mg (06/02/19 0959)     LOS: 3 days   Time Spent  in minutes   45 minutes  Meryle Pugmire D.O. on 06/02/2019 at 10:07 AM  Between 7am to 7pm - Please see pager noted on [email or web address]  After 7pm go to [email or web address]  And look for the night coverage person covering for me after hours  [facility] Office  [phone]

## 2019-06-03 ENCOUNTER — Encounter (HOSPITAL_COMMUNITY): Payer: Self-pay | Admitting: Internal Medicine

## 2019-06-03 LAB — HEMOGLOBIN AND HEMATOCRIT, BLOOD
HCT: 34.7 % — ABNORMAL LOW (ref 36.0–46.0)
Hemoglobin: 11.1 g/dL — ABNORMAL LOW (ref 12.0–15.0)

## 2019-06-03 MED ORDER — DOXYCYCLINE HYCLATE 100 MG PO TABS
100.0000 mg | ORAL_TABLET | Freq: Two times a day (BID) | ORAL | Status: DC
  Administered 2019-06-03 – 2019-06-06 (×7): 100 mg via ORAL
  Filled 2019-06-03 (×7): qty 1

## 2019-06-03 MED ORDER — CEPHALEXIN 500 MG PO CAPS
500.0000 mg | ORAL_CAPSULE | Freq: Four times a day (QID) | ORAL | Status: DC
  Administered 2019-06-03 – 2019-06-06 (×13): 500 mg via ORAL
  Filled 2019-06-03 (×12): qty 1

## 2019-06-03 NOTE — Progress Notes (Signed)
PROGRESS NOTE    Samantha Bishop  [id number] DOB: 04-19-1925 DOA: 05/29/2019 PCP: Hoyt Koch, MD   Brief Narrative:  HPI On 05/30/2019 by Dr. Gean Birchwood Samantha Bishop is a 86 y.o. female with history of A. fib and previous stroke presents to the ER because of worsening right lower extremity pain and epistaxis.  Patient's right lower extremity pain started about 8 days ago and was placed on empiric antibiotics by primary care physician despite taking which patient's pain has not improved.  About 4 days ago patient started developing epistaxis and had come to the ER and was seen by ENT surgeon who had followed up in the clinic and had cauterization done 2 days ago.  Patient had followed up with her primary care physician yesterday for the right lower extremity pain during which patient started having epistaxis again and was referred to the ER.  Interim history Admitted for RLE cellulitis, on IV antibiotics. Also had bleeding noted, GI and ENT consulted, no source of bleeding found.   Assessment & Plan   Right lower extremity cellulitis -Patient presenting with erythema and edema of the lower extremity after failed outpatient oral antibiotics -was placed on vancomycin and cefepime- will transition to keflex 500mg  QID, doxy 100mg  BID -Cellulitis improving slowly -Continue gabapentin - even with increase dose to 300mg  TID, patient continues to complain of leg pain and state she was pain overnight and unable to rest. -Continue morphine, oxycodone and robaxin PRN  -PT and OT recommended SNF- however patient refuses  Possible Bleed -Patient was initially admitted with epistaxis -She was on Eliquis which is now been held- since 05/24/19  -ENT consulted Dr.Teo-status post flexible fiberoptic laryngoscopy-showing no bleeding from nasal cavities or nasopharynx.  Blood clots noted in the hypopharynx on laryngoscopy. -Gastroenterology consulted and appreciated, s/p EGD: Normal  esophagus, erythematous mucosa in the gastric fundus and gastric body.  Findings to mild to explain recent bleeding.  Normal duodenum.  No source of recent bleeding identified. -hemoglobin pending today -Patient complain of nasal/oral dryness, continue nasal saline -On 1/22, discussed with ENT, Dr. Benjamine Mola,- ok to restart Eliquis. Will restart Eliquis today and monitor   History of atrial fibrillation -will restart today Eliquis  Essential hypertension -BP currently stable -Continue IV hydralazine as needed, HCTZ currently held  Normocytic Anemia -Hemoglobin pending today  History of CVA -Continue statin an Eliquis   DVT Prophylaxis Eliquis  Code Status: Full  Family Communication: None at bedside. Son via phone on 1/21  Disposition Plan: Admitted.  Pending improvement of right lower extremity cellulitis and pain.  Disposition likely home with home health likely on 1/25  Consultants ENT, Dr. Melene Plan Gastroenterology  Procedures  Flexible fiberoptic laryngoscopy EGD  Antibiotics   Anti-infectives (From admission, onward)   Start     Dose/Rate Route Frequency Ordered Stop   06/01/19 0900  vancomycin (VANCOREADY) IVPB 750 mg/150 mL     750 mg 150 mL/hr over 60 Minutes Intravenous Every 24 hours 05/31/19 0937     05/31/19 0800  vancomycin (VANCOREADY) IVPB 1250 mg/250 mL  Status:  Discontinued     1,250 mg 166.7 mL/hr over 90 Minutes Intravenous Every 24 hours 05/30/19 0604 05/31/19 0937   05/30/19 0800  ceFEPIme (MAXIPIME) 2 g in sodium chloride 0.9 % 100 mL IVPB     2 g 200 mL/hr over 30 Minutes Intravenous Every 12 hours 05/30/19 0625     05/30/19 0145  vancomycin (VANCOREADY) IVPB 1500 mg/300 mL  1,500 mg 150 mL/hr over 120 Minutes Intravenous  Once 05/30/19 0143 05/30/19 0435      Subjective:   [name] seen and examined today.  Feels pain is about the same, but was able to rest and get some sleep last night. Denies current chest pain, shortness of breath,  abdominal pain, N/V/D/C, dizziness, headache. Denies further bleeding.  Objective:   Vitals:   06/02/19 1400 06/02/19 1958 06/03/19 0429 06/03/19 0744  BP: (!) 143/56 138/62 130/69 136/62  Pulse: 81 85 76 78  Resp:  16 16 16   Temp: 98 F (36.7 C) 98.7 F (37.1 C)  98 F (36.7 C)  TempSrc: Oral Oral  Oral  SpO2:  (!) 88% 98% 97%  Weight:      Height:       No intake or output data in the 24 hours ending 06/03/19 0846 Filed Weights   05/30/19 2120  Weight: 84.8 kg   Exam  General: Well developed, well nourished, elderly, NAD  HEENT: NCAT, mucous membranes moist.   Cardiovascular: S1 S2 auscultated, IRR  Respiratory: Clear to auscultation bilaterally   Abdomen: Soft, nontender, nondistended, + bowel sounds  Extremities: warm dry without cyanosis clubbing. RLE edema and erythema improving  Neuro: AAOx3, nonfocal  Psych: Pleasant, appropriate mood and affect  Data Reviewed: I have personally reviewed following labs and imaging studies  CBC: Recent Labs  Lab 05/29/19 2241 05/30/19 0700 05/31/19 0323 06/01/19 0507 06/02/19 0318  WBC 9.6 7.8 8.4  --   --   NEUTROABS 6.0 5.1  --   --   --   HGB 11.9* 11.3* 10.6* 10.7* 10.9*  HCT 37.5 34.5* 33.6* 33.9* 33.8*  MCV 90.1 88.7 88.2  --   --   PLT 240 218 211  --   --    Basic Metabolic Panel: Recent Labs  Lab 05/29/19 2241 05/30/19 0700 05/31/19 0323 06/01/19 0507  NA 137 138 139 138  K 3.9 3.6 4.1 3.9  CL 102 103 106 106  CO2 24 24 23 25   GLUCOSE 117* 113* 138* 108*  BUN 13 11 14 11   CREATININE 0.65 0.60 0.60 0.56  CALCIUM 9.8 9.1 9.1 8.8*   GFR: Estimated Creatinine Clearance: 49.2 mL/min (by C-G formula based on SCr of 0.56 mg/dL). Liver Function Tests: No results for input(s): AST, ALT, ALKPHOS, BILITOT, PROT, ALBUMIN in the last 168 hours. No results for input(s): LIPASE, AMYLASE in the last 168 hours. No results for input(s): AMMONIA in the last 168 hours. Coagulation Profile: No results for  input(s): INR, PROTIME in the last 168 hours. Cardiac Enzymes: No results for input(s): CKTOTAL, CKMB, CKMBINDEX, TROPONINI in the last 168 hours. BNP (last 3 results) No results for input(s): PROBNP in the last 8760 hours. HbA1C: No results for input(s): HGBA1C in the last 72 hours. CBG: No results for input(s): GLUCAP in the last 168 hours. Lipid Profile: No results for input(s): CHOL, HDL, LDLCALC, TRIG, CHOLHDL, LDLDIRECT in the last 72 hours. Thyroid Function Tests: No results for input(s): TSH, T4TOTAL, FREET4, T3FREE, THYROIDAB in the last 72 hours. Anemia Panel: No results for input(s): VITAMINB12, FOLATE, FERRITIN, TIBC, IRON, RETICCTPCT in the last 72 hours. Urine analysis:    Component Value Date/Time   COLORURINE YELLOW 06/26/2016 1536   APPEARANCEUR CLOUDY (A) 06/26/2016 1536   LABSPEC 1.005 06/26/2016 1536   PHURINE 6.0 06/26/2016 1536   GLUCOSEU NEGATIVE 06/26/2016 1536   HGBUR LARGE (A) 06/26/2016 1536   BILIRUBINUR neg 07/15/2016 1649  KETONESUR NEGATIVE 06/26/2016 1536   PROTEINUR 15mg  07/15/2016 1649   PROTEINUR NEGATIVE 06/26/2016 1536   UROBILINOGEN 0.2 07/15/2016 1649   UROBILINOGEN 0.2 08/20/2011 1151   NITRITE neg 07/15/2016 1649   NITRITE POSITIVE (A) 06/26/2016 1536   LEUKOCYTESUR Negative 07/15/2016 1649   Sepsis Labs: @LABRCNTIP (procalcitonin:4,lacticidven:4)  ) Recent Results (from the past 240 hour(s))  Culture, blood (Routine X 2) w Reflex to ID Panel     Status: None (Preliminary result)   Collection Time: 05/30/19  1:31 AM   Specimen: BLOOD  Result Value Ref Range Status   Specimen Description BLOOD RIGHT ARM  Final   Special Requests   Final    BOTTLES DRAWN AEROBIC AND ANAEROBIC Blood Culture adequate volume   Culture   Final    NO GROWTH 3 DAYS Performed at [facility], [place] [address], [place], [place] [address]    Report Status PENDING  Incomplete  SARS CORONAVIRUS 2 (TAT 6-24 HRS) Nasopharyngeal Nasopharyngeal Swab      Status: None   Collection Time: 05/30/19  1:33 AM   Specimen: Nasopharyngeal Swab  Result Value Ref Range Status   SARS Coronavirus 2 NEGATIVE NEGATIVE Final    Comment: (NOTE) SARS-CoV-2 target nucleic acids are NOT DETECTED. The SARS-CoV-2 RNA is generally detectable in upper and lower respiratory specimens during the acute phase of infection. Negative results do not preclude SARS-CoV-2 infection, do not rule out co-infections with other pathogens, and should not be used as the sole basis for treatment or other patient management decisions. Negative results must be combined with clinical observations, patient history, and epidemiological information. The expected result is Negative. Fact Sheet for Patients: SugarRoll.be Fact Sheet for Healthcare Providers: https://www.woods-mathews.com/ This test is not yet approved or cleared by the [place] FDA and  has been authorized for detection and/or diagnosis of SARS-CoV-2 by FDA under an Emergency Use Authorization (EUA). This EUA will remain  in effect (meaning this test can be used) for the duration of the COVID-19 declaration under Section 56 4(b)(1) of the Act, 21 U.S.C. section 360bbb-3(b)(1), unless the authorization is terminated or revoked sooner. Performed at [facility], [place] [address], [place], [place] [address]   Culture, blood (Routine X 2) w Reflex to ID Panel     Status: None (Preliminary result)   Collection Time: 05/30/19  5:21 AM   Specimen: BLOOD  Result Value Ref Range Status   Specimen Description BLOOD RIGHT HAND  Final   Special Requests   Final    BOTTLES DRAWN AEROBIC AND ANAEROBIC Blood Culture adequate volume   Culture   Final    NO GROWTH 3 DAYS Performed at [facility], [place] [address], [place], [place] [address]    Report Status PENDING  Incomplete      Radiology Studies: No results found.   Scheduled Meds: . apixaban  5 mg Oral BID  .  fluticasone  2 spray Each Nare Daily  . gabapentin  300 mg Oral TID  . pravastatin  20 mg Oral q1800   Continuous Infusions: . ceFEPime (MAXIPIME) IV 2 g (06/02/19 2202)  . methocarbamol (ROBAXIN) IV    . vancomycin 750 mg (06/02/19 0959)     LOS: 4 days   Time Spent in minutes   30 minutes  Laurent Cargile D.O. on 06/03/2019 at 8:46 AM  Between 7am to 7pm - Please see pager noted on [email or web address]  After 7pm go to [email or web address]  And look for the  night coverage person covering for me after hours  [facility] Office  [phone]

## 2019-06-04 ENCOUNTER — Inpatient Hospital Stay (HOSPITAL_COMMUNITY): Payer: Medicare Other

## 2019-06-04 ENCOUNTER — Encounter (HOSPITAL_COMMUNITY): Payer: Self-pay | Admitting: Internal Medicine

## 2019-06-04 LAB — CULTURE, BLOOD (ROUTINE X 2)
Culture: NO GROWTH
Culture: NO GROWTH
Special Requests: ADEQUATE
Special Requests: ADEQUATE

## 2019-06-04 LAB — HEMOGLOBIN AND HEMATOCRIT, BLOOD
HCT: 32.8 % — ABNORMAL LOW (ref 36.0–46.0)
Hemoglobin: 10.3 g/dL — ABNORMAL LOW (ref 12.0–15.0)

## 2019-06-04 MED ORDER — IOHEXOL 300 MG/ML  SOLN
75.0000 mL | Freq: Once | INTRAMUSCULAR | Status: AC | PRN
  Administered 2019-06-04: 75 mL via INTRAVENOUS

## 2019-06-04 NOTE — Progress Notes (Signed)
Occupational Therapy Treatment Patient Details Name: Samantha Bishop MRN: [id number] DOB: 08/23/1924 Today's Date: 06/04/2019    History of present illness Pt is a 86 y/o female admitted secondary to RLE pain and epistaxis. Pt found to have RLE cellulitis. Pt is s/p EGD, however, showed no evidence for recent GI bleed. PMH includes a fib, CVA, HTN, and L TKA.    OT comments  Pt making good progress with functional goals. Ts session focused on LB ADLS, grooming at sink, toileting and ADL and ADL mobility with RW. OT will continue to follow acutely  Follow Up Recommendations  SNF    Equipment Recommendations  Tub/shower bench;Other (comment)(reacher)    Recommendations for Other Services      Precautions / Restrictions Precautions Precautions: Fall Restrictions Weight Bearing Restrictions: No       Mobility Bed Mobility Overal bed mobility: Modified Independent Bed Mobility: Supine to Sit           General bed mobility comments: pt able to transition to EOB without assist  Transfers Overall transfer level: Needs assistance Equipment used: Rolling walker (2 wheeled) Transfers: Sit to/from Stand Sit to Stand: Min assist         General transfer comment: min assist to rise from bed and minguard from toilet with use of rail , cues for hand placement    Balance Overall balance assessment: Needs assistance Sitting-balance support: No upper extremity supported;Feet supported Sitting balance-Leahy Scale: Fair     Standing balance support: Bilateral upper extremity supported;During functional activity Standing balance-Leahy Scale: Poor Standing balance comment: Reliant on UE support                           ADL either performed or assessed with clinical judgement   ADL Overall ADL's : Needs assistance/impaired     Grooming: Wash/dry hands;Wash/dry face;Min guard;Standing       Lower Body Bathing: Minimal assistance;Sit to/from stand;Cueing for  safety       Lower Body Dressing: Moderate assistance;Minimal assistance;Sit to/from stand;Cueing for safety   Toilet Transfer: Minimal assistance;Ambulation;RW;Cueing for safety;Regular Toilet;Grab bars   Toileting- Clothing Manipulation and Hygiene: Min guard;Sit to/from stand       Functional mobility during ADLs: Minimal assistance;Cueing for safety;Cueing for sequencing;Rolling walker       Vision Patient Visual Report: No change from baseline     Perception     Praxis      Cognition Arousal/Alertness: Awake/alert Behavior During Therapy: WFL for tasks assessed/performed Overall Cognitive Status: Impaired/Different from baseline Area of Impairment: Attention;Memory;Safety/judgement                   Current Attention Level: Selective Memory: Decreased short-term memory   Safety/Judgement: Decreased awareness of safety     General Comments: pt able to state use of call bell but had not requested pain medicine from nurse and reports sitting in chair too long yesterday with lack of awareness to ask for assist out of chair        Exercises General Exercises - Lower Extremity Long Arc Quad: AROM;Both;Seated;15 reps Hip Flexion/Marching: AROM;Both;Seated;15 reps   Shoulder Instructions       General Comments      Pertinent Vitals/ Pain       Pain Assessment: Faces Pain Score: 9  Faces Pain Scale: Hurts a little bit Pain Location: RLE  Pain Descriptors / Indicators: Aching;Sore Pain Intervention(s): Monitored during session;Premedicated before session;Repositioned  Home Living  Prior Functioning/Environment              Frequency  Min 2X/week        Progress Toward Goals  OT Goals(current goals can now be found in the care plan section)  Progress towards OT goals: Progressing toward goals     Plan      Co-evaluation                 AM-PAC OT "6 Clicks" Daily  Activity     Outcome Measure   Help from another person eating meals?: None Help from another person taking care of personal grooming?: A Little Help from another person toileting, which includes using toliet, bedpan, or urinal?: A Little Help from another person bathing (including washing, rinsing, drying)?: A Little Help from another person to put on and taking off regular upper body clothing?: None Help from another person to put on and taking off regular lower body clothing?: A Lot 6 Click Score: 19    End of Session Equipment Utilized During Treatment: Gait belt;Rolling walker  OT Visit Diagnosis: Unsteadiness on feet (R26.81);Other abnormalities of gait and mobility (R26.89);Muscle weakness (generalized) (M62.81);Pain;Other symptoms and signs involving cognitive function Pain - Right/Left: Right Pain - part of body: Leg   Activity Tolerance Patient tolerated treatment well   Patient Left in chair;with call bell/phone within reach;with chair alarm set   Nurse Communication          Time: [id number] OT Time Calculation (min): 33 min  Charges: OT General Charges $OT Visit: 1 Visit OT Treatments $Self Care/Home Management : 8-22 mins $Therapeutic Activity: 8-22 mins     Britt Bottom 06/04/2019, 2:57 PM

## 2019-06-04 NOTE — NC FL2 (Signed)
[place] LEVEL OF CARE SCREENING TOOL     IDENTIFICATION  Patient Name: Samantha Bishop Birthdate: 16-Feb-1925 Sex: female Admission Date (Current Location): 05/29/2019  [facility] and [place] Number:  Herbalist and Address:  [facility] [place]. [facility], [place] [address] [place], [place] [address]      Provider Number: [id number]  Attending Physician Name and Address:  Cristal Ford, DO  Relative Name and Phone Number:       Current Level of Care: SNF Recommended Level of Care: [place] Prior Approval Number:    Date Approved/Denied: 09/06/11 PASRR Number: [id number]  Discharge Plan: SNF    Current Diagnoses: Patient Active Problem List   Diagnosis Date Noted  . Cellulitis, leg 05/30/2019  . Epistaxis 05/30/2019  . Gastritis and gastroduodenitis   . Bleeding 05/29/2019  . Venous stasis ulcer limited to breakdown of skin without varicose veins ([place]) 05/17/2019  . Cellulitis of right lower extremity 05/17/2019  . Ulcer of right lower extremity, limited to breakdown of skin ([place]) 05/17/2019  . Acute post-traumatic headache, not intractable 12/21/2018  . Hot flashes 02/09/2017  . Adjustment disorder with mixed anxiety and depressed mood 06/28/2016  . CVA (cerebral vascular accident) ([place]) 06/26/2016  . Right homonymous hemianopsia 06/26/2016  . Back pain 04/08/2015  . Abnormal CT scan 04/08/2015  . Constipation 01/02/2015  . Intracranial aneurysm 10/18/2014  . Routine general medical examination at a health care facility 03/08/2014  . Occipital neuralgia 07/16/2013  . Venous insufficiency 07/11/2013  . Alopecia 06/08/2012  . Osteopenia 02/21/2010  . ANXIETY 05/21/2009  . Essential hypertension 05/21/2009  . Atrial fibrillation - followed by Dr. Caryl Comes 03/03/2009  . INTRACRANIAL ANEURYSM 08/16/2007  . Hyperlipidemia 08/15/2007  . GERD 08/15/2007    Orientation RESPIRATION BLADDER Height & Weight     Self, Time,  Situation, Place  Normal Continent Weight: 84.8 kg Height:  5\' 6"  (167.6 cm)  BEHAVIORAL SYMPTOMS/MOOD NEUROLOGICAL BOWEL NUTRITION STATUS      Continent Diet  AMBULATORY STATUS COMMUNICATION OF NEEDS Skin   Limited Assist Verbally Other (Comment)(Right distal Knee cellulitis)                       Personal Care Assistance Level of Assistance  Bathing, Feeding, Dressing Bathing Assistance: Limited assistance Feeding assistance: Limited assistance Dressing Assistance: Limited assistance     Functional Limitations Info  Sight, Hearing, Speech Sight Info: Adequate Hearing Info: Adequate Speech Info: Adequate    SPECIAL CARE FACTORS FREQUENCY  PT (By licensed PT), OT (By licensed OT)     PT Frequency: 5X week OT Frequency: 5X week            Contractures      Additional Factors Info  Code Status, Allergies Code Status Info: Full Allergies Info: PCN, codeine           Current Medications (06/04/2019):  This is the current hospital active medication list Current Facility-Administered Medications  Medication Dose Route Frequency Provider Last Rate Last Admin  . acetaminophen (TYLENOL) tablet 650 mg  650 mg Oral Q6H PRN Rise Patience, MD   650 mg at 06/03/19 1859   Or  . acetaminophen (TYLENOL) suppository 650 mg  650 mg Rectal Q6H PRN Rise Patience, MD      . apixaban [facility] [place]) tablet 5 mg  5 mg Oral BID Roree, Akemon, DO   5 mg at 06/04/19 0841  . cephALEXin (KEFLEX) capsule 500 mg  500 mg Oral 4x daily Nikiya, Huls, DO   500 mg at 06/04/19 1248  . cycloSPORINE (RESTASIS) 0.05 % ophthalmic emulsion 1 drop  1 drop Both Eyes PRN Kimyah, Peyser, DO      . doxycycline (VIBRA-TABS) tablet 100 mg  100 mg Oral Q12H Mikhail, [place], DO   100 mg at 06/04/19 [id number]  . fluticasone (FLONASE) 50 MCG/ACT nasal spray 2 spray  2 spray Each Nare Daily Nasha, Servantez, DO   2 spray at 06/04/19 0845  . gabapentin (NEURONTIN) capsule 300 mg  300 mg Oral TID  Cristal Ford, DO   300 mg at 06/04/19 1521  . hydrALAZINE (APRESOLINE) injection 10 mg  10 mg Intravenous Q4H PRN Rise Patience, MD      . methocarbamol (ROBAXIN) 500 mg in dextrose 5 % 50 mL IVPB  500 mg Intravenous Q8H PRN Cristal Ford, DO      . morphine 2 MG/ML injection 1 mg  1 mg Intravenous Q3H PRN Rise Patience, MD   1 mg at 06/03/19 0655  . ondansetron (ZOFRAN) tablet 4 mg  4 mg Oral Q6H PRN Rise Patience, MD       Or  . ondansetron [facility]) injection 4 mg  4 mg Intravenous Q6H PRN Rise Patience, MD   4 mg at 06/02/19 2202  . oxyCODONE (Oxy IR/ROXICODONE) immediate release tablet 5 mg  5 mg Oral Q4H PRN Laree, Grigoryan, DO   5 mg at 06/04/19 0841  . polyethylene glycol (MIRALAX / GLYCOLAX) packet 17 g  17 g Oral PRN Cristal Ford, DO      . pravastatin (PRAVACHOL) tablet 20 mg  20 mg Oral q1800 Rise Patience, MD   20 mg at 06/03/19 1859  . sodium chloride (OCEAN) 0.65 % nasal spray 1 spray  1 spray Each Nare PRN Cayleigh, [place], DO   1 spray at 06/01/19 1117     Discharge Medications: Please see discharge summary for a list of discharge medications.  Relevant Imaging Results:  Relevant Lab Results:   Additional Information SSN:  [id number]  Carles Collet, RN
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
## 2019-06-04 NOTE — Care Management (Addendum)
10:00 Attempted to call patient and son to discuss disposition, no answer.  12:00 Attempted to call again, LVM.  13:45 Spoke w [name], preference is SNF, he would like [facility], agreeable to be faxed out to all local SNFs. He states that patient goes back and forth about wanting to go to a SNF but lives alone and family is unable to stay with her even temporarily after DC.   Samantha Bishop, Samantha Bishop Son [phone]   Faxed out to [facility] SNFs. MD aware to order COVID 24-48 hours before expected DC.

## 2019-06-04 NOTE — Progress Notes (Signed)
PROGRESS NOTE    Samantha Bishop  [id number] DOB: 1925-03-22 DOA: 05/29/2019 PCP: Hoyt Koch, MD   Brief Narrative:  HPI On 05/30/2019 by Dr. Gean Birchwood Samantha Bishop is a 86 y.o. female with history of A. fib and previous stroke presents to the ER because of worsening right lower extremity pain and epistaxis.  Patient's right lower extremity pain started about 8 days ago and was placed on empiric antibiotics by primary care physician despite taking which patient's pain has not improved.  About 4 days ago patient started developing epistaxis and had come to the ER and was seen by ENT surgeon who had followed up in the clinic and had cauterization done 2 days ago.  Patient had followed up with her primary care physician yesterday for the right lower extremity pain during which patient started having epistaxis again and was referred to the ER.  Interim history Admitted for RLE cellulitis, on IV antibiotics. Also had bleeding noted, GI and ENT consulted, no source of bleeding found.   Assessment & Plan   Right lower extremity cellulitis -Patient presenting with erythema and edema of the lower extremity after failed outpatient oral antibiotics -was placed on vancomycin and cefepime- will transition to keflex 500mg  QID, doxy 100mg  BID -Cellulitis improving slowly -Continue gabapentin - even with increase dose to 300mg  TID, patient continues to complain of leg pain and state she was pain overnight and unable to rest. -Continue morphine, oxycodone and robaxin PRN  -PT and OT recommended SNF- however patient refuses -Continues to have pain and feels that her leg has not improved at all.  Will order CT tibia fibula with contrast to assess for cellulitis versus other pathology  Possible Bleed -Patient was initially admitted with epistaxis -She was on Eliquis which is now been held- since 05/24/19  -ENT consulted Dr.Teo-status post flexible fiberoptic laryngoscopy-showing no  bleeding from nasal cavities or nasopharynx.  Blood clots noted in the hypopharynx on laryngoscopy. -Gastroenterology consulted and appreciated, s/p EGD: Normal esophagus, erythematous mucosa in the gastric fundus and gastric body.  Findings to mild to explain recent bleeding.  Normal duodenum.  No source of recent bleeding identified. -Patient complain of nasal/oral dryness, continue nasal saline -On 1/22, discussed with ENT, Dr. Benjamine Mola,- ok to restart Eliquis. -Eliquis restarted on 1/23 -Hemoglobin currently 10.3  History of atrial fibrillation -Continue Eliquis  Essential hypertension -BP currently stable -Continue IV hydralazine as needed, HCTZ currently held  Normocytic Anemia -Hemoglobin pending 10.3  History of CVA -Continue statin an Eliquis   DVT Prophylaxis Eliquis  Code Status: Full  Family Communication: None at bedside. Son via phone on 1/21  Disposition Plan: Admitted.  Pending improvement of right lower extremity cellulitis and pain.  Will obtain CT scan of the leg.  Suspect disposition will be home with home health when patient has improved.  Consultants ENT, Dr. Melene Plan Gastroenterology  Procedures  Flexible fiberoptic laryngoscopy EGD  Antibiotics   Anti-infectives (From admission, onward)   Start     Dose/Rate Route Frequency Ordered Stop   06/03/19 1300  cephALEXin (KEFLEX) capsule 500 mg     500 mg Oral 4 times daily 06/03/19 0951     06/03/19 1000  doxycycline (VIBRA-TABS) tablet 100 mg     100 mg Oral Every 12 hours 06/03/19 0951     06/01/19 0900  vancomycin (VANCOREADY) IVPB 750 mg/150 mL  Status:  Discontinued     750 mg 150 mL/hr over 60 Minutes Intravenous Every 24 hours 05/31/19  [id number] 06/03/19 0951   05/31/19 0800  vancomycin (VANCOREADY) IVPB 1250 mg/250 mL  Status:  Discontinued     1,250 mg 166.7 mL/hr over 90 Minutes Intravenous Every 24 hours 05/30/19 0604 05/31/19 0937   05/30/19 0800  ceFEPIme (MAXIPIME) 2 g in sodium chloride 0.9 % 100 mL  IVPB  Status:  Discontinued     2 g 200 mL/hr over 30 Minutes Intravenous Every 12 hours 05/30/19 0625 06/03/19 0951   05/30/19 0145  vancomycin (VANCOREADY) IVPB 1500 mg/300 mL     1,500 mg 150 mL/hr over 120 Minutes Intravenous  Once 05/30/19 0143 05/30/19 0435      Subjective:   Samantha Bishop seen and examined today.  Continues to feel pain in her leg and states it is not getting any better.  She is unable to bear weight on her leg.  She currently denies any further bleeding.  Denies any chest pain or shortness of breath, abdominal pain, nausea or vomiting, diarrhea or constipation, dizziness or headache. Objective:   Vitals:   06/03/19 2114 06/03/19 2355 06/04/19 0413 06/04/19 0738  BP: (!) 145/69 (!) 140/51 (!) 116/54 (!) 175/71  Pulse: 76 71 74 78  Resp:   18 16  Temp: 98.7 F (37.1 C)  (!) 97.3 F (36.3 C) 97.7 F (36.5 C)  TempSrc: Oral   Oral  SpO2: 95%  96% 93%  Weight:      Height:        Intake/Output Summary (Last 24 hours) at 06/04/2019 0948 Last data filed at 06/04/2019 0745 Gross per 24 hour  Intake 600 ml  Output --  Net 600 ml   Filed Weights   05/30/19 2120  Weight: 84.8 kg   Exam  General: Well developed, well nourished, NAD, appears stated age  HEENT: NCAT, mucous membranes moist.   Cardiovascular: S1 S2 auscultated, irregular  Respiratory: Clear to auscultation bilaterally with equal chest rise  Abdomen: Soft, nontender, nondistended, + bowel sounds  Extremities: warm dry without cyanosis clubbing.  LE edema and erythema-TTP.  Neuro: AAOx3, nonfocal  Psych: Appropriate mood and affect, pleasant  Data Reviewed: I have personally reviewed following labs and imaging studies  CBC: Recent Labs  Lab 05/29/19 2241 05/29/19 2241 05/30/19 0700 05/30/19 0700 05/31/19 0323 06/01/19 0507 06/02/19 0318 06/03/19 1000 06/04/19 0219  WBC 9.6  --  7.8  --  8.4  --   --   --   --   NEUTROABS 6.0  --  5.1  --   --   --   --   --   --   HGB  11.9*   < > 11.3*   < > 10.6* 10.7* 10.9* 11.1* 10.3*  HCT 37.5   < > 34.5*   < > 33.6* 33.9* 33.8* 34.7* 32.8*  MCV 90.1  --  88.7  --  88.2  --   --   --   --   PLT 240  --  218  --  211  --   --   --   --    < > = values in this interval not displayed.   Basic Metabolic Panel: Recent Labs  Lab 05/29/19 2241 05/30/19 0700 05/31/19 0323 06/01/19 0507  NA 137 138 139 138  K 3.9 3.6 4.1 3.9  CL 102 103 106 106  CO2 24 24 23 25   GLUCOSE 117* 113* 138* 108*  BUN 13 11 14 11   CREATININE 0.65 0.60 0.60 0.56  CALCIUM 9.8 9.1  9.1 8.8*   GFR: Estimated Creatinine Clearance: 49.2 mL/min (by C-G formula based on SCr of 0.56 mg/dL). Liver Function Tests: No results for input(s): AST, ALT, ALKPHOS, BILITOT, PROT, ALBUMIN in the last 168 hours. No results for input(s): LIPASE, AMYLASE in the last 168 hours. No results for input(s): AMMONIA in the last 168 hours. Coagulation Profile: No results for input(s): INR, PROTIME in the last 168 hours. Cardiac Enzymes: No results for input(s): CKTOTAL, CKMB, CKMBINDEX, TROPONINI in the last 168 hours. BNP (last 3 results) No results for input(s): PROBNP in the last 8760 hours. HbA1C: No results for input(s): HGBA1C in the last 72 hours. CBG: No results for input(s): GLUCAP in the last 168 hours. Lipid Profile: No results for input(s): CHOL, HDL, LDLCALC, TRIG, CHOLHDL, LDLDIRECT in the last 72 hours. Thyroid Function Tests: No results for input(s): TSH, T4TOTAL, FREET4, T3FREE, THYROIDAB in the last 72 hours. Anemia Panel: No results for input(s): VITAMINB12, FOLATE, FERRITIN, TIBC, IRON, RETICCTPCT in the last 72 hours. Urine analysis:    Component Value Date/Time   COLORURINE YELLOW 06/26/2016 1536   APPEARANCEUR CLOUDY (A) 06/26/2016 1536   LABSPEC 1.005 06/26/2016 1536   PHURINE 6.0 06/26/2016 1536   GLUCOSEU NEGATIVE 06/26/2016 1536   HGBUR LARGE (A) 06/26/2016 1536   BILIRUBINUR neg 07/15/2016 1649   KETONESUR NEGATIVE 06/26/2016  1536   PROTEINUR 15mg  07/15/2016 1649   PROTEINUR NEGATIVE 06/26/2016 1536   UROBILINOGEN 0.2 07/15/2016 1649   UROBILINOGEN 0.2 08/20/2011 1151   NITRITE neg 07/15/2016 1649   NITRITE POSITIVE (A) 06/26/2016 1536   LEUKOCYTESUR Negative 07/15/2016 1649   Sepsis Labs: @LABRCNTIP (procalcitonin:4,lacticidven:4)  ) Recent Results (from the past 240 hour(s))  Culture, blood (Routine X 2) w Reflex to ID Panel     Status: None   Collection Time: 05/30/19  1:31 AM   Specimen: BLOOD  Result Value Ref Range Status   Specimen Description BLOOD RIGHT ARM  Final   Special Requests   Final    BOTTLES DRAWN AEROBIC AND ANAEROBIC Blood Culture adequate volume   Culture   Final    NO GROWTH 5 DAYS Performed at [facility], [place] [address] [place], [place] [address]    Report Status 06/04/2019 FINAL  Final  SARS CORONAVIRUS 2 (TAT 6-24 HRS) Nasopharyngeal Nasopharyngeal Swab     Status: None   Collection Time: 05/30/19  1:33 AM   Specimen: Nasopharyngeal Swab  Result Value Ref Range Status   SARS Coronavirus 2 NEGATIVE NEGATIVE Final    Comment: (NOTE) SARS-CoV-2 target nucleic acids are NOT DETECTED. The SARS-CoV-2 RNA is generally detectable in upper and lower respiratory specimens during the acute phase of infection. Negative results do not preclude SARS-CoV-2 infection, do not rule out co-infections with other pathogens, and should not be used as the sole basis for treatment or other patient management decisions. Negative results must be combined with clinical observations, patient history, and epidemiological information. The expected result is Negative. Fact Sheet for Patients: SugarRoll.be Fact Sheet for Healthcare Providers: https://www.woods-mathews.com/ This test is not yet approved or cleared by the [place] FDA and  has been authorized for detection and/or diagnosis of SARS-CoV-2 by FDA under an Emergency Use Authorization  (EUA). This EUA will remain  in effect (meaning this test can be used) for the duration of the COVID-19 declaration under Section 56 4(b)(1) of the Act, 21 U.S.C. section 360bbb-3(b)(1), unless the authorization is terminated or revoked sooner. Performed at [facility], [place]  [address], [place], [place] [address]   Culture, blood (Routine X 2) w Reflex to ID Panel     Status: None   Collection Time: 05/30/19  5:21 AM   Specimen: BLOOD  Result Value Ref Range Status   Specimen Description BLOOD RIGHT HAND  Final   Special Requests   Final    BOTTLES DRAWN AEROBIC AND ANAEROBIC Blood Culture adequate volume   Culture   Final    NO GROWTH 5 DAYS Performed at [facility], [address] [place], [place] [address]    Report Status 06/04/2019 FINAL  Final      Radiology Studies: No results found.   Scheduled Meds: . apixaban  5 mg Oral BID  . cephALEXin  500 mg Oral 4x daily  . doxycycline  100 mg Oral Q12H  . fluticasone  2 spray Each Nare Daily  . gabapentin  300 mg Oral TID  . pravastatin  20 mg Oral q1800   Continuous Infusions: . methocarbamol (ROBAXIN) IV       LOS: 5 days   Time Spent in minutes   30 minutes  Jerret Mcbane D.O. on 06/04/2019 at 9:48 AM  Between 7am to 7pm - Please see pager noted on [email or web address]  After 7pm go to [email or web address]  And look for the night coverage person covering for me after hours  [facility] Office  [phone]

## 2019-06-05 LAB — BASIC METABOLIC PANEL
Anion gap: 9 (ref 5–15)
BUN: 16 mg/dL (ref 8–23)
CO2: 26 mmol/L (ref 22–32)
Calcium: 9.5 mg/dL (ref 8.9–10.3)
Chloride: 104 mmol/L (ref 98–111)
Creatinine, Ser: 0.74 mg/dL (ref 0.44–1.00)
GFR calc Af Amer: >60 mL/min (ref >60)
GFR calc non Af Amer: >60 mL/min (ref >60)
Glucose, Bld: 109 mg/dL — ABNORMAL HIGH (ref 70–99)
Potassium: 4 mmol/L (ref 3.5–5.1)
Sodium: 139 mmol/L (ref 135–145)

## 2019-06-05 LAB — HEMOGLOBIN AND HEMATOCRIT, BLOOD
HCT: 33 % — ABNORMAL LOW (ref 36.0–46.0)
Hemoglobin: 10.6 g/dL — ABNORMAL LOW (ref 12.0–15.0)

## 2019-06-05 LAB — SARS CORONAVIRUS 2 (TAT 6-24 HRS): SARS Coronavirus 2: NEGATIVE

## 2019-06-05 NOTE — Progress Notes (Signed)
Physical Therapy Treatment Patient Details Name: Samantha Bishop MRN: [id number] DOB: 1925/01/03 Today's Date: 06/05/2019    History of Present Illness Pt is a 86 y/o female admitted secondary to RLE pain and epistaxis. Pt found to have RLE cellulitis. Pt is s/p EGD, however, showed no evidence for recent GI bleed. PMH includes a fib, CVA, HTN, and L TKA.     PT Comments    Pt in chair on arrival reporting feeling better after bath this am. Pt reports lack of BM for several days as very concerning with RN made aware and pt ordering prune juice end of session. Pt remains limited by RLE pain and educated for need to request pain medication when needed. Pt with improved gait tolerance and D/C plan remains appropriate.     Follow Up Recommendations  SNF;Supervision for mobility/OOB     Equipment Recommendations  Rolling walker with 5" wheels    Recommendations for Other Services       Precautions / Restrictions Precautions Precautions: Fall    Mobility  Bed Mobility               General bed mobility comments: pt in chair on arrival  Transfers Overall transfer level: Needs assistance   Transfers: Sit to/from Stand Sit to Stand: Min guard         General transfer comment: cues for hand placement with pt able to rise/lower to recliner with armrests  Ambulation/Gait Ambulation/Gait assistance: Min guard Gait Distance (Feet): 80 Feet Assistive device: Rolling walker (2 wheeled) Gait Pattern/deviations: Step-through pattern;Decreased stride length;Trunk flexed   Gait velocity interpretation: 1.31 - 2.62 ft/sec, indicative of limited community ambulator General Gait Details: pt with decline from initial step through gait to step to pattern with increased pain RLE with gait. Pt educated for sequence and was able to tolerate increased gait distance in spite of pain today   Stairs             Wheelchair Mobility    Modified Rankin (Stroke Patients Only)        Balance Overall balance assessment: Needs assistance   Sitting balance-Leahy Scale: Fair       Standing balance-Leahy Scale: Poor Standing balance comment: Reliant on UE support                            Cognition Arousal/Alertness: Awake/alert Behavior During Therapy: WFL for tasks assessed/performed Overall Cognitive Status: Impaired/Different from baseline                       Memory: Decreased short-term memory         General Comments: pt continues to state lack of requests for pain medicine or assist despite pain and awareness of call bell      Exercises General Exercises - Lower Extremity Long Arc Quad: AROM;Both;Seated;20 reps Hip Flexion/Marching: AROM;Both;Seated;15 reps    General Comments        Pertinent Vitals/Pain Pain Score: 6  Pain Location: RLE  Pain Descriptors / Indicators: Aching;Sore;Guarding;Tender Pain Intervention(s): Limited activity within patient's tolerance;Monitored during session;Repositioned;RN gave pain meds during session    Home Living                      Prior Function            PT Goals (current goals can now be found in the care plan section) Progress towards PT goals: Progressing  toward goals    Frequency    Min 3X/week      PT Plan Current plan remains appropriate    Co-evaluation              AM-PAC PT "6 Clicks" Mobility   Outcome Measure  Help needed turning from your back to your side while in a flat bed without using bedrails?: None Help needed moving from lying on your back to sitting on the side of a flat bed without using bedrails?: None Help needed moving to and from a bed to a chair (including a wheelchair)?: A Little Help needed standing up from a chair using your arms (e.g., wheelchair or bedside chair)?: A Little Help needed to walk in hospital room?: A Little Help needed climbing 3-5 steps with a railing? : A Lot 6 Click Score: 19    End of  Session Equipment Utilized During Treatment: Gait belt Activity Tolerance: Patient limited by pain Patient left: in chair;with call bell/phone within reach;with chair alarm set Nurse Communication: Mobility status PT Visit Diagnosis: Difficulty in walking, not elsewhere classified (R26.2);Pain;Other abnormalities of gait and mobility (R26.89)     Time: [id number] PT Time Calculation (min) (ACUTE ONLY): 19 min  Charges:  $Gait Training: 8-22 mins                     Bayard Males, PT Acute Rehabilitation Services Pager: [phone] Office: [place] 06/05/2019, 1:07 PM

## 2019-06-05 NOTE — TOC Progression Note (Addendum)
Transition of Care [facility]) - Progression Note    Patient Details  Name: MATTINGLY NEIMEYER MRN: [id number] Date of Birth: Oct 27, 1924  [facility] CM/SW Contact  Bartholomew Crews, RN Phone Number: [phone] 06/05/2019, 5:08 PM  Clinical Narrative:    [id number] - Update: Received call back from patient's son, [name]. Bed offers provided. Discussed going to medicare.gov website to review ratings. Requested call back in AM with choice. TOC following.   Left voicemail for patient's son, [name], with NCM contact information. Spoke with patient at the bedside to discuss bed offers. Patient requests follow up with her son. TOC following.    Expected Discharge Plan: Skilled Nursing Facility Barriers to Discharge: Continued Medical Work up  Expected Discharge Plan and Services Expected Discharge Plan: [place]   Discharge Planning Services: CM Consult                                           Social Determinants of Health (SDOH) Interventions    Readmission Risk Interventions No flowsheet data found.

## 2019-06-05 NOTE — Progress Notes (Signed)
   Vital Signs MEWS/VS Documentation      06/05/2019 0807 06/05/2019 1359 06/05/2019 1749 06/05/2019 1822   MEWS Score:  0  1  4  1    MEWS Score Color:  Green  Green  Red  Green   Resp:  17  17  --  --   Pulse:  83  89  --  --   BP:  (!) 154/85  (!) 89/49  --  --   Temp:  98.2 F (36.8 C)  98.5 F (36.9 C)  --  --   O2 Device:  Room Air  Room Air  --  --     Red mews - Pt had contipation &  is in the bathroom straining;trying to have  a bowel movement; CCMD flagged it as red due to increased heart rate but after having a BM;heart rate is in 80s-90s.Marland KitchenHospitalist paged.      Janee Morn 06/05/2019,6:28 PM

## 2019-06-05 NOTE — Progress Notes (Signed)
PROGRESS NOTE    Samantha Bishop  [id number] DOB: 1924/10/19 DOA: 05/29/2019 PCP: Hoyt Koch, MD   Brief Narrative:  HPI On 05/30/2019 by Dr. Gean Birchwood Samantha Bishop is a 86 y.o. female with history of A. fib and previous stroke presents to the ER because of worsening right lower extremity pain and epistaxis.  Patient's right lower extremity pain started about 8 days ago and was placed on empiric antibiotics by primary care physician despite taking which patient's pain has not improved.  About 4 days ago patient started developing epistaxis and had come to the ER and was seen by ENT surgeon who had followed up in the clinic and had cauterization done 2 days ago.  Patient had followed up with her primary care physician yesterday for the right lower extremity pain during which patient started having epistaxis again and was referred to the ER.  Interim history Admitted for RLE cellulitis, on IV antibiotics. Also had bleeding noted, GI and ENT consulted, no source of bleeding found.  Now pending SNF.  Assessment & Plan   Right lower extremity cellulitis -Patient presenting with erythema and edema of the lower extremity after failed outpatient oral antibiotics -was placed on vancomycin and cefepime- Transitioned to keflex 500mg  QID, doxy 100mg  BID -Cellulitis improving slowly -Continue gabapentin - even with increase dose to 300mg  TID, patient continues to complain of leg pain and state she was pain overnight and unable to rest. -Continue morphine, oxycodone and robaxin PRN  -CT tibia/fibula showed mild circumferential skin thickening and subcutaneous edema of the mid to distal lower leg, nonspecific can be seen with cellulitis, venous insufficiency or third spacing.  No abscess or evidence of osteomyelitis. -Discussed this with patient and son, patient may benefit from TED hose in the future. -PT and OT recommended SNF-  patient now agreeable  Possible Bleed -Patient  was initially admitted with epistaxis -She was on Eliquis which is now been held- since 05/24/19  -ENT consulted Dr.Teo-status post flexible fiberoptic laryngoscopy-showing no bleeding from nasal cavities or nasopharynx.  Blood clots noted in the hypopharynx on laryngoscopy. -Gastroenterology consulted and appreciated, s/p EGD: Normal esophagus, erythematous mucosa in the gastric fundus and gastric body.  Findings to mild to explain recent bleeding.  Normal duodenum.  No source of recent bleeding identified. -Patient complain of nasal/oral dryness, continue nasal saline -On 1/22, discussed with ENT, Dr. Benjamine Mola,- ok to restart Eliquis. -Eliquis restarted on 1/23 -Hemoglobin currently 10.6  History of atrial fibrillation -Continue Eliquis  Essential hypertension -BP currently stable -Continue IV hydralazine as needed, HCTZ currently held  Normocytic Anemia -Hemoglobin 10.6, stable  History of CVA -Continue statin an Eliquis   DVT Prophylaxis Eliquis  Code Status: Full  Family Communication: None at bedside. Son via phone on 1/25  Disposition Plan: Admitted.  Have discussed SNF placement with patient, she now agrees.  TOC consulted.  Now pending SNF placement and repeat Covid.  Consultants ENT, Dr. Melene Plan Gastroenterology  Procedures  Flexible fiberoptic laryngoscopy EGD  Antibiotics   Anti-infectives (From admission, onward)   Start     Dose/Rate Route Frequency Ordered Stop   06/03/19 1300  cephALEXin (KEFLEX) capsule 500 mg     500 mg Oral 4 times daily 06/03/19 0951     06/03/19 1000  doxycycline (VIBRA-TABS) tablet 100 mg     100 mg Oral Every 12 hours 06/03/19 0951     06/01/19 0900  vancomycin (VANCOREADY) IVPB 750 mg/150 mL  Status:  Discontinued  750 mg 150 mL/hr over 60 Minutes Intravenous Every 24 hours 05/31/19 0937 06/03/19 0951   05/31/19 0800  vancomycin (VANCOREADY) IVPB 1250 mg/250 mL  Status:  Discontinued     1,250 mg 166.7 mL/hr over 90 Minutes  Intravenous Every 24 hours 05/30/19 0604 05/31/19 0937   05/30/19 0800  ceFEPIme (MAXIPIME) 2 g in sodium chloride 0.9 % 100 mL IVPB  Status:  Discontinued     2 g 200 mL/hr over 30 Minutes Intravenous Every 12 hours 05/30/19 0625 06/03/19 0951   05/30/19 0145  vancomycin (VANCOREADY) IVPB 1500 mg/300 mL     1,500 mg 150 mL/hr over 120 Minutes Intravenous  Once 05/30/19 0143 05/30/19 0435      Subjective:   [name] seen and examined today.  Feels it is too early in the day to make any decisions.  Does agree to rehab placement now.  Denies current leg pain.  Still does not feel that she is able to bear weight on her leg due to soreness.  Denies current chest pain or shortness of breath, abdominal pain, nausea or vomiting, diarrhea or constipation, denies or headache.  Denies any further bleeding. Objective:   Vitals:   06/04/19 1318 06/04/19 1949 06/05/19 0338 06/05/19 0807  BP: (!) 127/55 (!) 154/64 (!) 125/56 (!) 154/85  Pulse: 79 82 76 83  Resp: 16 16 16 17   Temp: 98.6 F (37 C) 97.8 F (36.6 C) 98.7 F (37.1 C) 98.2 F (36.8 C)  TempSrc: Oral  Oral Oral  SpO2: 95% 96% 92% 94%  Weight:      Height:        Intake/Output Summary (Last 24 hours) at 06/05/2019 [id number] Last data filed at 06/05/2019 0700 Gross per 24 hour  Intake 360 ml  Output -  Net 360 ml   Filed Weights   05/30/19 2120  Weight: 84.8 kg   Exam  General: Well developed, well nourished, elderly, NAD, appears stated age  HEENT: NCAT, mucous membranes moist.   Cardiovascular: S1 S2 auscultated, irregular  Respiratory: Clear to auscultation bilaterally with equal chest rise  Abdomen: Soft, nontender, nondistended, + bowel sounds  Extremities: warm dry without cyanosis clubbing. LLE edema and erythema improving slowly.  Neuro: AAOx3, nonfocal  Psych: Appropriate mood and affect, pleasant   Data Reviewed: I have personally reviewed following labs and imaging studies  CBC: Recent Labs  Lab  05/29/19 2241 05/29/19 2241 05/30/19 0700 05/30/19 0700 05/31/19 0323 05/31/19 0323 06/01/19 0507 06/02/19 0318 06/03/19 1000 06/04/19 0219 06/05/19 0307  WBC 9.6  --  7.8  --  8.4  --   --   --   --   --   --   NEUTROABS 6.0  --  5.1  --   --   --   --   --   --   --   --   HGB 11.9*   < > 11.3*   < > 10.6*   < > 10.7* 10.9* 11.1* 10.3* 10.6*  HCT 37.5   < > 34.5*   < > 33.6*   < > 33.9* 33.8* 34.7* 32.8* 33.0*  MCV 90.1  --  88.7  --  88.2  --   --   --   --   --   --   PLT 240  --  218  --  211  --   --   --   --   --   --    < > =  values in this interval not displayed.   Basic Metabolic Panel: Recent Labs  Lab 05/29/19 2241 05/30/19 0700 05/31/19 0323 06/01/19 0507 06/05/19 0307  NA 137 138 139 138 139  K 3.9 3.6 4.1 3.9 4.0  CL 102 103 106 106 104  CO2 24 24 23 25 26   GLUCOSE 117* 113* 138* 108* 109*  BUN 13 11 14 11 16   CREATININE 0.65 0.60 0.60 0.56 0.74  CALCIUM 9.8 9.1 9.1 8.8* 9.5   GFR: Estimated Creatinine Clearance: 49.2 mL/min (by C-G formula based on SCr of 0.74 mg/dL). Liver Function Tests: No results for input(s): AST, ALT, ALKPHOS, BILITOT, PROT, ALBUMIN in the last 168 hours. No results for input(s): LIPASE, AMYLASE in the last 168 hours. No results for input(s): AMMONIA in the last 168 hours. Coagulation Profile: No results for input(s): INR, PROTIME in the last 168 hours. Cardiac Enzymes: No results for input(s): CKTOTAL, CKMB, CKMBINDEX, TROPONINI in the last 168 hours. BNP (last 3 results) No results for input(s): PROBNP in the last 8760 hours. HbA1C: No results for input(s): HGBA1C in the last 72 hours. CBG: No results for input(s): GLUCAP in the last 168 hours. Lipid Profile: No results for input(s): CHOL, HDL, LDLCALC, TRIG, CHOLHDL, LDLDIRECT in the last 72 hours. Thyroid Function Tests: No results for input(s): TSH, T4TOTAL, FREET4, T3FREE, THYROIDAB in the last 72 hours. Anemia Panel: No results for input(s): VITAMINB12, FOLATE,  FERRITIN, TIBC, IRON, RETICCTPCT in the last 72 hours. Urine analysis:    Component Value Date/Time   COLORURINE YELLOW 06/26/2016 1536   APPEARANCEUR CLOUDY (A) 06/26/2016 1536   LABSPEC 1.005 06/26/2016 1536   PHURINE 6.0 06/26/2016 1536   GLUCOSEU NEGATIVE 06/26/2016 1536   HGBUR LARGE (A) 06/26/2016 1536   BILIRUBINUR neg 07/15/2016 1649   KETONESUR NEGATIVE 06/26/2016 1536   PROTEINUR 15mg  07/15/2016 1649   PROTEINUR NEGATIVE 06/26/2016 1536   UROBILINOGEN 0.2 07/15/2016 1649   UROBILINOGEN 0.2 08/20/2011 1151   NITRITE neg 07/15/2016 1649   NITRITE POSITIVE (A) 06/26/2016 1536   LEUKOCYTESUR Negative 07/15/2016 1649   Sepsis Labs: @LABRCNTIP (procalcitonin:4,lacticidven:4)  ) Recent Results (from the past 240 hour(s))  Culture, blood (Routine X 2) w Reflex to ID Panel     Status: None   Collection Time: 05/30/19  1:31 AM   Specimen: BLOOD  Result Value Ref Range Status   Specimen Description BLOOD RIGHT ARM  Final   Special Requests   Final    BOTTLES DRAWN AEROBIC AND ANAEROBIC Blood Culture adequate volume   Culture   Final    NO GROWTH 5 DAYS Performed at [facility], [place] [address], [place], [place] [address]    Report Status 06/04/2019 FINAL  Final  SARS CORONAVIRUS 2 (TAT 6-24 HRS) Nasopharyngeal Nasopharyngeal Swab     Status: None   Collection Time: 05/30/19  1:33 AM   Specimen: Nasopharyngeal Swab  Result Value Ref Range Status   SARS Coronavirus 2 NEGATIVE NEGATIVE Final    Comment: (NOTE) SARS-CoV-2 target nucleic acids are NOT DETECTED. The SARS-CoV-2 RNA is generally detectable in upper and lower respiratory specimens during the acute phase of infection. Negative results do not preclude SARS-CoV-2 infection, do not rule out co-infections with other pathogens, and should not be used as the sole basis for treatment or other patient management decisions. Negative results must be combined with clinical observations, patient history, and  epidemiological information. The expected result is Negative. Fact Sheet for Patients: SugarRoll.be Fact Sheet for Healthcare Providers: https://www.woods-mathews.com/  This test is not yet approved or cleared by the [place] and  has been authorized for detection and/or diagnosis of SARS-CoV-2 by FDA under an Emergency Use Authorization (EUA). This EUA will remain  in effect (meaning this test can be used) for the duration of the COVID-19 declaration under Section 56 4(b)(1) of the Act, 21 U.S.C. section 360bbb-3(b)(1), unless the authorization is terminated or revoked sooner. Performed at [facility], [place] [address] [place], [place] [address]   Culture, blood (Routine X 2) w Reflex to ID Panel     Status: None   Collection Time: 05/30/19  5:21 AM   Specimen: BLOOD  Result Value Ref Range Status   Specimen Description BLOOD RIGHT HAND  Final   Special Requests   Final    BOTTLES DRAWN AEROBIC AND ANAEROBIC Blood Culture adequate volume   Culture   Final    NO GROWTH 5 DAYS Performed at [facility], [address] [place], [place] [address]    Report Status 06/04/2019 FINAL  Final      Radiology Studies: CT TIBIA FIBULA RIGHT W CONTRAST  Result Date: 06/04/2019 CLINICAL DATA:  Right lower leg cellulitis. EXAM: CT OF THE LOWER RIGHT EXTREMITY WITH CONTRAST TECHNIQUE: Multidetector CT imaging of the right tibia and fibula was performed according to the standard protocol following intravenous contrast administration. COMPARISON:  Right tibia and fibula x-rays dated May 30, 2019. CONTRAST:  30mL OMNIPAQUE IOHEXOL 300 MG/ML  SOLN FINDINGS: Bones/Joint/Cartilage No fracture or dislocation. No osseous destruction or periosteal reaction. Prior right total knee arthroplasty. Small knee joint effusion. Ligaments Ligaments are suboptimally evaluated by CT. Muscles and Tendons Grossly intact. No muscle atrophy. Soft tissue Mild  circumferential skin thickening and subcutaneous edema of the mid to distal lower leg. Innumerable superficial phleboliths. Three-vessel atherosclerotic calcification. No fluid collection or hematoma. No soft tissue mass. IMPRESSION: 1. Mild circumferential skin thickening and subcutaneous edema of the mid to distal lower leg, nonspecific, but can be seen with cellulitis, venous insufficiency, or third spacing. No abscess or CT evidence of osteomyelitis. 2. Prior right total knee arthroplasty with small joint effusion. Electronically Signed   By: Titus Dubin M.D.   On: 06/04/2019 10:22     Scheduled Meds: . apixaban  5 mg Oral BID  . cephALEXin  500 mg Oral 4x daily  . doxycycline  100 mg Oral Q12H  . fluticasone  2 spray Each Nare Daily  . gabapentin  300 mg Oral TID  . pravastatin  20 mg Oral q1800   Continuous Infusions: . methocarbamol (ROBAXIN) IV       LOS: 6 days   Time Spent in minutes   30 minutes  Shanaia Sievers D.O. on 06/05/2019 at 9:08 AM  Between 7am to 7pm - Please see pager noted on [email or web address]  After 7pm go to [email or web address]  And look for the night coverage person covering for me after hours  [facility] Office  [phone]

## 2019-06-06 ENCOUNTER — Inpatient Hospital Stay (HOSPITAL_COMMUNITY): Payer: Medicare Other

## 2019-06-06 DIAGNOSIS — J069 Acute upper respiratory infection, unspecified: Secondary | ICD-10-CM | POA: Diagnosis not present

## 2019-06-06 DIAGNOSIS — I69828 Other speech and language deficits following other cerebrovascular disease: Secondary | ICD-10-CM | POA: Diagnosis not present

## 2019-06-06 DIAGNOSIS — M545 Low back pain: Secondary | ICD-10-CM | POA: Diagnosis not present

## 2019-06-06 DIAGNOSIS — I69398 Other sequelae of cerebral infarction: Secondary | ICD-10-CM | POA: Diagnosis not present

## 2019-06-06 DIAGNOSIS — L03115 Cellulitis of right lower limb: Secondary | ICD-10-CM | POA: Diagnosis not present

## 2019-06-06 DIAGNOSIS — Z8249 Family history of ischemic heart disease and other diseases of the circulatory system: Secondary | ICD-10-CM | POA: Diagnosis not present

## 2019-06-06 DIAGNOSIS — R040 Epistaxis: Secondary | ICD-10-CM | POA: Diagnosis not present

## 2019-06-06 DIAGNOSIS — I4821 Permanent atrial fibrillation: Secondary | ICD-10-CM | POA: Diagnosis not present

## 2019-06-06 DIAGNOSIS — Z809 Family history of malignant neoplasm, unspecified: Secondary | ICD-10-CM | POA: Diagnosis not present

## 2019-06-06 DIAGNOSIS — D649 Anemia, unspecified: Secondary | ICD-10-CM | POA: Diagnosis not present

## 2019-06-06 DIAGNOSIS — J341 Cyst and mucocele of nose and nasal sinus: Secondary | ICD-10-CM | POA: Diagnosis not present

## 2019-06-06 DIAGNOSIS — Z96651 Presence of right artificial knee joint: Secondary | ICD-10-CM | POA: Diagnosis not present

## 2019-06-06 DIAGNOSIS — K219 Gastro-esophageal reflux disease without esophagitis: Secondary | ICD-10-CM | POA: Diagnosis not present

## 2019-06-06 DIAGNOSIS — M2550 Pain in unspecified joint: Secondary | ICD-10-CM | POA: Diagnosis not present

## 2019-06-06 DIAGNOSIS — R2681 Unsteadiness on feet: Secondary | ICD-10-CM | POA: Diagnosis not present

## 2019-06-06 DIAGNOSIS — Z7401 Bed confinement status: Secondary | ICD-10-CM | POA: Diagnosis not present

## 2019-06-06 DIAGNOSIS — E782 Mixed hyperlipidemia: Secondary | ICD-10-CM | POA: Diagnosis not present

## 2019-06-06 DIAGNOSIS — M1990 Unspecified osteoarthritis, unspecified site: Secondary | ICD-10-CM | POA: Diagnosis not present

## 2019-06-06 DIAGNOSIS — I872 Venous insufficiency (chronic) (peripheral): Secondary | ICD-10-CM | POA: Diagnosis not present

## 2019-06-06 DIAGNOSIS — L0390 Cellulitis, unspecified: Secondary | ICD-10-CM | POA: Diagnosis not present

## 2019-06-06 DIAGNOSIS — K2990 Gastroduodenitis, unspecified, without bleeding: Secondary | ICD-10-CM | POA: Diagnosis not present

## 2019-06-06 DIAGNOSIS — I10 Essential (primary) hypertension: Secondary | ICD-10-CM | POA: Diagnosis not present

## 2019-06-06 DIAGNOSIS — Z86718 Personal history of other venous thrombosis and embolism: Secondary | ICD-10-CM | POA: Diagnosis not present

## 2019-06-06 DIAGNOSIS — Z8673 Personal history of transient ischemic attack (TIA), and cerebral infarction without residual deficits: Secondary | ICD-10-CM | POA: Diagnosis not present

## 2019-06-06 DIAGNOSIS — I890 Lymphedema, not elsewhere classified: Secondary | ICD-10-CM | POA: Diagnosis not present

## 2019-06-06 DIAGNOSIS — K2970 Gastritis, unspecified, without bleeding: Secondary | ICD-10-CM | POA: Diagnosis not present

## 2019-06-06 DIAGNOSIS — I4891 Unspecified atrial fibrillation: Secondary | ICD-10-CM | POA: Diagnosis not present

## 2019-06-06 DIAGNOSIS — M6281 Muscle weakness (generalized): Secondary | ICD-10-CM | POA: Diagnosis not present

## 2019-06-06 DIAGNOSIS — Z8719 Personal history of other diseases of the digestive system: Secondary | ICD-10-CM | POA: Diagnosis not present

## 2019-06-06 DIAGNOSIS — Z7901 Long term (current) use of anticoagulants: Secondary | ICD-10-CM | POA: Diagnosis not present

## 2019-06-06 DIAGNOSIS — I87311 Chronic venous hypertension (idiopathic) with ulcer of right lower extremity: Secondary | ICD-10-CM | POA: Diagnosis not present

## 2019-06-06 DIAGNOSIS — L97311 Non-pressure chronic ulcer of right ankle limited to breakdown of skin: Secondary | ICD-10-CM | POA: Diagnosis not present

## 2019-06-06 DIAGNOSIS — E785 Hyperlipidemia, unspecified: Secondary | ICD-10-CM | POA: Diagnosis not present

## 2019-06-06 DIAGNOSIS — Z880 Allergy status to penicillin: Secondary | ICD-10-CM | POA: Diagnosis not present

## 2019-06-06 DIAGNOSIS — G4730 Sleep apnea, unspecified: Secondary | ICD-10-CM | POA: Diagnosis not present

## 2019-06-06 DIAGNOSIS — M79604 Pain in right leg: Secondary | ICD-10-CM | POA: Diagnosis not present

## 2019-06-06 DIAGNOSIS — Z23 Encounter for immunization: Secondary | ICD-10-CM | POA: Diagnosis not present

## 2019-06-06 DIAGNOSIS — Z885 Allergy status to narcotic agent status: Secondary | ICD-10-CM | POA: Diagnosis not present

## 2019-06-06 DIAGNOSIS — S91001A Unspecified open wound, right ankle, initial encounter: Secondary | ICD-10-CM | POA: Diagnosis not present

## 2019-06-06 DIAGNOSIS — R41841 Cognitive communication deficit: Secondary | ICD-10-CM | POA: Diagnosis not present

## 2019-06-06 LAB — HEMOGLOBIN AND HEMATOCRIT, BLOOD
HCT: 32.4 % — ABNORMAL LOW (ref 36.0–46.0)
Hemoglobin: 10.2 g/dL — ABNORMAL LOW (ref 12.0–15.0)

## 2019-06-06 MED ORDER — SALINE SPRAY 0.65 % NA SOLN: 1.0000 | NASAL | Status: DC | PRN

## 2019-06-06 MED ORDER — ACETAMINOPHEN 325 MG PO TABS: 650.0000 mg | ORAL_TABLET | Freq: Four times a day (QID) | ORAL | Status: DC | PRN

## 2019-06-06 MED ORDER — GABAPENTIN 100 MG PO CAPS: 300.0000 mg | ORAL_CAPSULE | Freq: Three times a day (TID) | ORAL | Status: DC

## 2019-06-06 MED ORDER — IOHEXOL 350 MG/ML SOLN
75.0000 mL | Freq: Once | INTRAVENOUS | Status: AC | PRN
  Administered 2019-06-06: 75 mL via INTRAVENOUS

## 2019-06-06 MED ORDER — DOXYCYCLINE MONOHYDRATE 100 MG PO TABS: 100.0000 mg | ORAL_TABLET | Freq: Two times a day (BID) | ORAL | Status: DC

## 2019-06-06 MED ORDER — OXYCODONE HCL 5 MG PO TABS: 5.0000 mg | ORAL_TABLET | Freq: Three times a day (TID) | ORAL | 0 refills | Status: DC | PRN

## 2019-06-06 MED ORDER — HYDROCHLOROTHIAZIDE 25 MG PO TABS: 12.5000 mg | ORAL_TABLET | Freq: Every day | ORAL | Status: DC

## 2019-06-06 MED ORDER — CEPHALEXIN 500 MG PO CAPS: 500.0000 mg | ORAL_CAPSULE | Freq: Four times a day (QID) | ORAL | Status: DC

## 2019-06-06 NOTE — Progress Notes (Signed)
Pt had spontaneous nose bleed during night. Large amounts of blood draining from nares. Pt reports feeling hot prior to incident. Pt cleaned, cold compress placed on face, resting comfortably.

## 2019-06-06 NOTE — Discharge Summary (Signed)
Physician Discharge Summary  Samantha Bishop [id number] DOB: [birth year]  PCP: Hoyt Koch, MD  Admitted from: Home Discharged to: SNF  Admit date: 05/29/2019 Discharge date: 06/06/2019  Recommendations for Outpatient Follow-up:    Contact information for follow-up providers    MD at SNF. Schedule an appointment as soon as possible for a visit.   Why: To be seen in 2 to 3 days with repeat labs (CBC & BMP).       Leta Baptist, MD. Schedule an appointment as soon as possible for a visit in 1 week(s).   Specialty: Otolaryngology Contact information: [address] [place] [address] [phone]        Hoyt Koch, MD. Schedule an appointment as soon as possible for a visit.   Specialty: Internal Medicine Why: Upon discharge from SNF. Contact information: [place] [address] [phone]            Contact information for after-discharge care    Destination    [facility] Preferred SNF .   Service: Skilled Nursing Contact information: [address] [place]: N/A. Equipment/Devices: TBD at SNF  Discharge Condition: Improved and stable CODE STATUS: Full Diet recommendation: Heart healthy diet.  Discharge Diagnoses:  Principal Problem:   Cellulitis, leg Active Problems:   Essential hypertension   Atrial fibrillation - followed by Dr. Caryl Comes   Epistaxis   Gastritis and gastroduodenitis   Brief Summary: 86 year old female with PMH of permanent atrial fibrillation on Eliquis, prior stroke, HLD, HTN, DVT/PE presented to the hospital due to worsening right lower extremity pain and suspected epistaxis.  Right lower extremity pain had started about 8 days prior to admission and she had been started on empiric antibiotics by PCP for suspected cellulitis, despite which she had not improved.  4 days PTA she also developed  what appeared to be epistaxis, had come to the ED, seen by ENT surgeon who also followed her up in the clinic and underwent cauterization 2 days PTA.  She had since seen her PCP on day prior to admission for the right lower extremity pain during which time she started having epistaxis again and was referred to the ED for further evaluation and management.  She was admitted for right lower extremity cellulitis that failed outpatient antibiotics, undetermined source of suspected epistaxis versus other cause of bleeding.  GI and ENT were consulted.  Assessment and plan:  1. Right lower extremity cellulitis: Patient presented with erythema and edema of right lower extremity after she failed outpatient oral antibiotic treatment.  She was empirically started on IV vancomycin and cefepime.  She completed 4 days of IV cefepime.  She was then transitioned to oral Keflex and doxycycline.  Cellulitis has improved or even resolved.  She does have some suspected postinflammatory skin changes which may take several days or even a couple of weeks to completely resolve.  This was discussed in detail with patient and her son via phone.  They verbalized understanding.  Continue gabapentin that she was on PTA for possible neuropathic pain.  She was recently prescribed [place] [facility].  I reviewed [place] PDMP which showed that she was given 7-day supply/30 tabs of OxyIR 5 mg tablets on 05/17/2019.  This is likely for her right lower extremity pain.  She has not used any opioids for  the last 24 hours.  Since patient is going to an SNF, provided a brief supply via hard prescription.  Minimize use of opioids due to known side effects in an elderly patient.  CT tibia/fibula showed mild circumferential skin thickening and subcutaneous edema of the lower leg, nonspecific can be seen with cellulitis, venous insufficiency or third spacing.  No evidence of osteomyelitis.  Consider TED hose in the future.  Resume HCTZ at discharge which should help with  leg edema.  Therapies recommended SNF and patient agreeable. 2. ENT bleeding, unclear source: Initially admitted as suspected epistaxis.  Prior to admission, her Eliquis had been held since 05/24/2019.  ENT Dr. Benjamine Mola consulted, s/p flexible fiberoptic laryngoscopy which did not show any bleeding from nasal cavities or nasopharynx.  Blood clots noted in the hypopharynx on laryngoscopy.  [facility] was consulted and s/p EGD: Normal esophagus, erythematous gastric fundus and gastric body.  As per GI, findings too mild to explain recent bleeding.  Normal duodenum.  No source of recent bleeding identified.  After discussing with ENT, her Eliquis was resumed on 1/23.  Hemoglobin remained stable.  Overnight 1/26, patient had recurrent episode of bleeding which has again resolved this morning without any blood in the anterior nares or posterior pharynx.  I discussed in detail with Dr. Benjamine Mola who indicates that he does not think that this is epistaxis based on his own multiple evaluations of the patient, up to 4 times.  During the last endoscopic evaluation, he did see a couple of blood vessels which were not bleeding which could have been the cause and he cauterized same.  He still is not sure of source of bleeding.  At his request, ordered CT neck without contrast, detailed report as below, no obvious source of bleeding.  I updated Dr. Benjamine Mola regarding CT results, he has cleared patient for discharge to SNF on continued Eliquis and recommends outpatient follow-up in his office.  Continue nasal sprays as below. 3. Permanent atrial fibrillation: Controlled ventricular rate.  Not on rate control medications PTA.  Continue Eliquis while closely monitoring for bleeding.  Obviously if she has recurrent bleeding issues may have to reconsider decision regarding continuing Eliquis. 4. Essential hypertension: HCTZ which was briefly held during the hospital admission will be resumed at discharge. 5. Hyperlipidemia: Continue  statins. 6. Normocytic anemia: Stable.  She could have anemia of chronic disease.  Outpatient follow-up and evaluation as deemed necessary. 7. History of CVA: Continue statins and Eliquis.   Consultations:  ENT  [facility]  Procedures:  Flexible fiberoptic laryngoscopy  EGD   Discharge Instructions  Discharge Instructions    Call MD for:   Complete by: As directed    Recurrent bleeding from nose or mouth.  If she has such bleeding, inform MD ASAP, need to assess at bedside to determine if patient is truly having bleeding from her nostrils or mouth.  This would be important because currently the source of bleeding is not very clear.   Call MD for:  difficulty breathing, headache or visual disturbances   Complete by: As directed    Call MD for:  extreme fatigue   Complete by: As directed    Call MD for:  persistant dizziness or light-headedness   Complete by: As directed    Call MD for:  persistant nausea and vomiting   Complete by: As directed    Call MD for:  redness, tenderness, or signs of infection (pain, swelling, redness, odor or green/yellow discharge around incision  site)   Complete by: As directed    Call MD for:  severe uncontrolled pain   Complete by: As directed    Call MD for:  temperature >100.4   Complete by: As directed    Diet - low sodium heart healthy   Complete by: As directed    Increase activity slowly   Complete by: As directed        Medication List    STOP taking these medications   cefdinir 300 MG capsule Commonly known as: OMNICEF     TAKE these medications   acetaminophen 325 MG tablet Commonly known as: TYLENOL Take 2 tablets (650 mg total) by mouth every 6 (six) hours as needed for mild pain, moderate pain or fever (or Fever >/= 101).   apixaban 5 MG Tabs tablet Commonly known as: Eliquis TAKE 1 TABLET (5 MG TOTAL) BY MOUTH 2 (TWO) TIMES DAILY. What changed:   how much to take  how to take this  when to take this    cephALEXin 500 MG capsule Commonly known as: KEFLEX Take 1 capsule (500 mg total) by mouth in the morning, at noon, in the evening, and at bedtime. Discontinue after today i.e. 06/06/2019 doses.   cycloSPORINE 0.05 % ophthalmic emulsion Commonly known as: RESTASIS Place 1 drop into both eyes as needed (dry eyes).   doxycycline 100 MG tablet Commonly known as: ADOXA Take 1 tablet (100 mg total) by mouth 2 (two) times daily. Discontinue after today i.e. 06/06/2019 doses.   fluticasone 50 MCG/ACT nasal spray Commonly known as: FLONASE Place 2 sprays into both nostrils daily.   gabapentin 100 MG capsule Commonly known as: NEURONTIN Take 3 capsules (300 mg total) by mouth 3 (three) times daily. What changed:   how much to take  when to take this   hydrochlorothiazide 25 MG tablet Commonly known as: HYDRODIURIL Take 0.5 tablets (12.5 mg total) by mouth daily.   oxyCODONE 5 MG immediate release tablet Commonly known as: Oxy IR/ROXICODONE Take 1 tablet (5 mg total) by mouth every 8 (eight) hours as needed for severe pain. What changed: when to take this   polyethylene glycol 17 g packet Commonly known as: MIRALAX / GLYCOLAX Take 17 g by mouth as needed for mild constipation.   pravastatin 20 MG tablet Commonly known as: PRAVACHOL TAKE 1 TABLET (20 MG TOTAL) BY MOUTH DAILY AT 6 PM.   sodium chloride 0.65 % Soln nasal spray Commonly known as: OCEAN Place 1 spray into both nostrils as needed for congestion.   Vasculera Tabs Take 630 mg by mouth daily.      Allergies  Allergen Reactions  . Codeine     REACTION: causes nausea  . Penicillins     REACTION: rash      Procedures/Studies: DG Tibia/Fibula Right  Result Date: 05/30/2019 CLINICAL DATA:  Infection. Cellulitis. EXAM: RIGHT TIBIA AND FIBULA - 2 VIEW COMPARISON:  May 29, 2007 FINDINGS: The patient is status post total knee arthroplasty. There is nonspecific subcutaneous edema involving the right lower  extremity. Multiple calcifications are noted in the subcutaneous soft tissues which may related to chronic venous insufficiency. There is no radiographic evidence for osteomyelitis. There is no acute displaced fracture. There are no pockets of subcutaneous gas. IMPRESSION: 1. No radiographic evidence for osteomyelitis. 2. Nonspecific subcutaneous edema involving the right lower extremity. This can be seen in patients with cellulitis in the appropriate clinical setting. 3. Soft tissue calcifications, likely related to chronic venous insufficiency. Electronically  Signed   By: Constance Holster M.D.   On: 05/30/2019 00:55   CT SOFT TISSUE NECK W CONTRAST  Result Date: 06/06/2019 CLINICAL DATA:  Tonsil/adenoid disorder. Additional history provided: Reported recurrent epistaxis of unclear source despite extensive evaluation by ENT in GI, rule out ENT source for bleeding. Additional history provided by technologist: Patient reports developing epistaxis 4 days ago with presentation to the ER, cauterization 2 days ago. EXAM: CT NECK WITH CONTRAST TECHNIQUE: Multidetector CT imaging of the neck was performed using the standard protocol following the bolus administration of intravenous contrast. CONTRAST:  66mL OMNIPAQUE IOHEXOL 350 MG/ML SOLN COMPARISON:  Head CT 05/11/2017. FINDINGS: Pharynx and larynx: Streak artifact from dental restoration limits evaluation of the oral cavity. Within this limitation, no appreciable mass, swelling or vascular lesion is identified within the oral cavity, sinonasal region, pharynx or larynx. Salivary glands: No inflammation, mass, or stone. Thyroid: Subcentimeter right thyroid lobe nodules not meeting consensus criteria for ultrasound follow-up. Lymph nodes: No pathologically enlarged cervical chain lymph nodes. Vascular: The major vascular structures of the neck appear patent. Calcified plaque within the carotid bifurcations and proximal internal carotid arteries bilaterally.  Partially retropharyngeal course of the bilateral ICAs. Limited intracranial: Redemonstrated right parietal, left occipital and left cerebellar encephalomalacia. Calcified plaque within the bilateral carotid artery siphons. Visualized orbits: Bilateral lens replacements. No acute abnormality at the visualized levels. Mastoids and visualized paranasal sinuses: Moderate-sized left maxillary sinus mucous retention cyst. There is otherwise no more than trace scattered paranasal sinus mucosal thickening at the imaged levels. Skeleton: No acute bony abnormality. Cervical spondylosis greatest at C6-C7 where there is advanced disc height loss with endplate spurring and a shallow posterior disc osteophyte. Trace C3-C4 anterolisthesis. Upper chest: No consolidation within the imaged lung apices. Atherosclerotic plaque within the visualized aortic arch and proximal major branch vessels of the neck. IMPRESSION: No specific cause of recurrent epistaxis is identified. No soft tissue neck mass or cervical lymphadenopathy is identified. Moderate-sized left maxillary sinus mucous retention cyst. Atherosclerotic disease. This includes moderate calcified plaque within the carotid bifurcations and proximal internal carotid arteries bilaterally. Electronically Signed   By: Kellie Simmering DO   On: 06/06/2019 11:36   CT TIBIA FIBULA RIGHT W CONTRAST  Result Date: 06/04/2019 CLINICAL DATA:  Right lower leg cellulitis. EXAM: CT OF THE LOWER RIGHT EXTREMITY WITH CONTRAST TECHNIQUE: Multidetector CT imaging of the right tibia and fibula was performed according to the standard protocol following intravenous contrast administration. COMPARISON:  Right tibia and fibula x-rays dated May 30, 2019. CONTRAST:  58mL OMNIPAQUE IOHEXOL 300 MG/ML  SOLN FINDINGS: Bones/Joint/Cartilage No fracture or dislocation. No osseous destruction or periosteal reaction. Prior right total knee arthroplasty. Small knee joint effusion. Ligaments Ligaments are  suboptimally evaluated by CT. Muscles and Tendons Grossly intact. No muscle atrophy. Soft tissue Mild circumferential skin thickening and subcutaneous edema of the mid to distal lower leg. Innumerable superficial phleboliths. Three-vessel atherosclerotic calcification. No fluid collection or hematoma. No soft tissue mass. IMPRESSION: 1. Mild circumferential skin thickening and subcutaneous edema of the mid to distal lower leg, nonspecific, but can be seen with cellulitis, venous insufficiency, or third spacing. No abscess or CT evidence of osteomyelitis. 2. Prior right total knee arthroplasty with small joint effusion. Electronically Signed   By: Titus Dubin M.D.   On: 06/04/2019 10:22       Subjective: Overnight events noted.  Reported recurrent nosebleed.  Interviewed and examined patient along with her female RN at bedside.  Patient states  that she has feeling of warmth across her body, left side of her nostril before noticing bleeding.  She feels that it is her left nostril.  Currently without bleeding and denies complaints.  Some right leg pain but better compared to admission.  No GI symptoms reported.  As per RN, no acute issues noted.  Discharge Exam:  Vitals:   06/05/19 1839 06/05/19 1920 06/06/19 0351 06/06/19 0751  BP: (!) 151/63 (!) 151/77 137/75 (!) 152/52  Pulse: 82 77 60 79  Resp: 17 15 15 18   Temp: 98.6 F (37 C) 98.7 F (37.1 C) 97.9 F (36.6 C) (!) 97.5 F (36.4 C)  TempSrc: Oral Oral Oral Oral  SpO2: 94% 93% 93% 91%  Weight:      Height:        General: Pleasant elderly female, moderately built and nourished lying comfortably propped up in bed without distress.  Oral mucosa moist. Cardiovascular: S1 & S2 heard, RRR, S1/S2 +. No murmurs, rubs, gallops or clicks. No JVD or pedal edema. Respiratory: Clear to auscultation without wheezing, rhonchi or crackles. No increased work of breathing. Abdominal:  Non distended, non tender & soft. No organomegaly or masses  appreciated. Normal bowel sounds heard. CNS: Alert and oriented. No focal deficits. Extremities: no edema, no cyanosis.  Right knee TKR scar.  Minimal right lower leg/ankle warmth, faint redness, superficial abrasion/ulcer over lower anterior ankle/leg and medial ankle but no significant tenderness, crepitus or fluctuation.    The results of significant diagnostics from this hospitalization (including imaging, microbiology, ancillary and laboratory) are listed below for reference.     Microbiology: Recent Results (from the past 240 hour(s))  Culture, blood (Routine X 2) w Reflex to ID Panel     Status: None   Collection Time: 05/30/19  1:31 AM   Specimen: BLOOD  Result Value Ref Range Status   Specimen Description BLOOD RIGHT ARM  Final   Special Requests   Final    BOTTLES DRAWN AEROBIC AND ANAEROBIC Blood Culture adequate volume   Culture   Final    NO GROWTH 5 DAYS Performed at [facility], [address] [place], [place] [address]    Report Status 06/04/2019 FINAL  Final  SARS CORONAVIRUS 2 (TAT 6-24 HRS) Nasopharyngeal Nasopharyngeal Swab     Status: None   Collection Time: 05/30/19  1:33 AM   Specimen: Nasopharyngeal Swab  Result Value Ref Range Status   SARS Coronavirus 2 NEGATIVE NEGATIVE Final    Comment: (NOTE) SARS-CoV-2 target nucleic acids are NOT DETECTED. The SARS-CoV-2 RNA is generally detectable in upper and lower respiratory specimens during the acute phase of infection. Negative results do not preclude SARS-CoV-2 infection, do not rule out co-infections with other pathogens, and should not be used as the sole basis for treatment or other patient management decisions. Negative results must be combined with clinical observations, patient history, and epidemiological information. The expected result is Negative. Fact Sheet for Patients: SugarRoll.be Fact Sheet for Healthcare  Providers: https://www.woods-mathews.com/ This test is not yet approved or cleared by the [place] FDA and  has been authorized for detection and/or diagnosis of SARS-CoV-2 by FDA under an Emergency Use Authorization (EUA). This EUA will remain  in effect (meaning this test can be used) for the duration of the COVID-19 declaration under Section 56 4(b)(1) of the Act, 21 U.S.C. section 360bbb-3(b)(1), unless the authorization is terminated or revoked sooner. Performed at [facility], [place] [address], [place], [place] [address]  Culture, blood (Routine X 2) w Reflex to ID Panel     Status: None   Collection Time: 05/30/19  5:21 AM   Specimen: BLOOD  Result Value Ref Range Status   Specimen Description BLOOD RIGHT HAND  Final   Special Requests   Final    BOTTLES DRAWN AEROBIC AND ANAEROBIC Blood Culture adequate volume   Culture   Final    NO GROWTH 5 DAYS Performed at [facility], [address] [place], [place] [address]    Report Status 06/04/2019 FINAL  Final  SARS CORONAVIRUS 2 (TAT 6-24 HRS) Nasopharyngeal Nasopharyngeal Swab     Status: None   Collection Time: 06/05/19 10:02 AM   Specimen: Nasopharyngeal Swab  Result Value Ref Range Status   SARS Coronavirus 2 NEGATIVE NEGATIVE Final    Comment: (NOTE) SARS-CoV-2 target nucleic acids are NOT DETECTED. The SARS-CoV-2 RNA is generally detectable in upper and lower respiratory specimens during the acute phase of infection. Negative results do not preclude SARS-CoV-2 infection, do not rule out co-infections with other pathogens, and should not be used as the sole basis for treatment or other patient management decisions. Negative results must be combined with clinical observations, patient history, and epidemiological information. The expected result is Negative. Fact Sheet for Patients: SugarRoll.be Fact Sheet for Healthcare  Providers: https://www.woods-mathews.com/ This test is not yet approved or cleared by the [place] FDA and  has been authorized for detection and/or diagnosis of SARS-CoV-2 by FDA under an Emergency Use Authorization (EUA). This EUA will remain  in effect (meaning this test can be used) for the duration of the COVID-19 declaration under Section 56 4(b)(1) of the Act, 21 U.S.C. section 360bbb-3(b)(1), unless the authorization is terminated or revoked sooner. Performed at [facility], [place] [address], [place],  [address]      Labs: CBC: Recent Labs  Lab 05/31/19 [phone] 06/01/19 0507 06/02/19 0318 06/03/19 1000 06/04/19 0219 06/05/19 0307 06/06/19 0148  WBC 8.4  --   --   --   --   --   --   HGB 10.6*   < > 10.9* 11.1* 10.3* 10.6* 10.2*  HCT 33.6*   < > 33.8* 34.7* 32.8* 33.0* 32.4*  MCV 88.2  --   --   --   --   --   --   PLT 211  --   --   --   --   --   --    < > = values in this interval not displayed.    Basic Metabolic Panel: Recent Labs  Lab 05/31/19 0323 06/01/19 0507 06/05/19 0307  NA 139 138 139  K 4.1 3.9 4.0  CL 106 106 104  CO2 23 25 26   GLUCOSE 138* 108* 109*  BUN 14 11 16   CREATININE 0.60 0.56 0.74  CALCIUM 9.1 8.8* 9.5    I discussed in detail with patient's son via phone, updated care and answered questions.  Time coordinating discharge: 40 minutes  SIGNED:  Vernell Leep, MD, [place], [facility]. [facility]  To contact the attending provider between 7A-7P or the covering provider during after hours 7P-7A, please log into the web site [email or web address] and access using universal [place] password for that web site. If you do not have the password, please call the hospital operator.

## 2019-06-06 NOTE — TOC Progression Note (Signed)
Transition of Care [facility]) - Progression Note    Patient Details  Name: Samantha Bishop MRN: [id number] Date of Birth: 05-14-1924  Transition [facility]) CM/SW Contact  Bartholomew Crews, RN Phone Number: [phone] 06/06/2019, 10:00 AM  Clinical Narrative:    Received call from Dorise Bullion, patient's son. They have selected [facility] for rehab.   Spoke with admissions at [facility] to confirm bed offer. They are able to accept today if medically stable.   Spoke with MD. Patient is pending CT scan - if negative results, will dc. [place] aware.   Spoke with son to advise of barriers and potential for dc today pending medical tests. Son to call facility to complete paperwork for possible transition today.   TOC team following for transition needs.    Expected Discharge Plan: Skilled Nursing Facility Barriers to Discharge: Continued Medical Work up  Expected Discharge Plan and Services Expected Discharge Plan: [place]   Discharge Planning Services: CM Consult                                           Social Determinants of Health (SDOH) Interventions    Readmission Risk Interventions No flowsheet data found.

## 2019-06-06 NOTE — Progress Notes (Signed)
Dressing changed per order. Pt discharged to facility via [facility].

## 2019-06-06 NOTE — TOC Transition Note (Signed)
Transition of Care [facility]) - CM/SW Discharge Note   Patient Details  Name: Samantha Bishop MRN: [id number] Date of Birth: September 02, 1924  Transition of Care [facility]) CM/SW Contact:  Bartholomew Crews, RN Phone Number: [phone] 06/06/2019, 1:50 PM   Clinical Narrative:    Advised by bedside nurse that patient had concerns about her nosebleed and the uncertainty of the cause. Spoke to patient's son, [name], on the phone who had just spoken with patient. He expressed concerns for the origin of the nosebleeds as well. Advised that NCM would talk to his mom.   Spoke with paient at the bedside to discuss transition to [facility]. Patient expressed concerns about her nose bleeds and tightness in her right lower extremity. Discussed MD notes per DC summary and that her condition would be continued to be monitored at [facility], and that she would have MD follow appointments with ENT and PCP. Patient verbalized understanding.   Patient to go to Room 511. Bedside nurse to call report to [phone]. Bedside RN aware.   [facility] arranged for transport. Medical transport paperwork in DC envelope.   Left voicemail for patient's son to advise of [place] ambulance arrangements. NCM contact number provided.   No further TOC needs identified at this time.    Final next level of care: Skilled Nursing Facility Barriers to Discharge: No Barriers Identified   Patient Goals and CMS Choice Patient states their goals for this hospitalization and ongoing recovery are:: go to rehab before back home CMS Medicare.gov Compare Post Acute Care list provided to:: Patient Choice offered to / list presented to : Patient, Adult Children  Discharge Placement              Patient chooses bed at: [facility] Patient to be transferred to facility by: [place] Name of family member notified: Doniesha Rhines Patient and family notified of of transfer: 06/06/19  Discharge Plan and Services   Discharge  Planning Services: CM Consult            DME Arranged: N/A DME Agency: NA       HH Arranged: NA HH Agency: NA        Social Determinants of Health (SDOH) Interventions     Readmission Risk Interventions No flowsheet data found.

## 2019-06-06 NOTE — Plan of Care (Signed)

## 2019-06-06 NOTE — Discharge Instructions (Signed)

## 2019-06-06 NOTE — Progress Notes (Signed)
Called facility and gave report to [facility], all questions and concerns addressed, Pt not in distress, to discharge to SNF via [facility] with belongings.

## 2019-06-08 ENCOUNTER — Telehealth: Payer: Self-pay | Admitting: *Deleted

## 2019-06-08 ENCOUNTER — Other Ambulatory Visit: Payer: Self-pay | Admitting: Internal Medicine

## 2019-06-08 ENCOUNTER — Non-Acute Institutional Stay (SKILLED_NURSING_FACILITY): Payer: Medicare Other | Admitting: Internal Medicine

## 2019-06-08 ENCOUNTER — Encounter: Payer: Self-pay | Admitting: Internal Medicine

## 2019-06-08 DIAGNOSIS — I10 Essential (primary) hypertension: Secondary | ICD-10-CM | POA: Diagnosis not present

## 2019-06-08 DIAGNOSIS — Z8673 Personal history of transient ischemic attack (TIA), and cerebral infarction without residual deficits: Secondary | ICD-10-CM

## 2019-06-08 DIAGNOSIS — L03115 Cellulitis of right lower limb: Secondary | ICD-10-CM

## 2019-06-08 DIAGNOSIS — R040 Epistaxis: Secondary | ICD-10-CM | POA: Diagnosis not present

## 2019-06-08 DIAGNOSIS — L97911 Non-pressure chronic ulcer of unspecified part of right lower leg limited to breakdown of skin: Secondary | ICD-10-CM

## 2019-06-08 DIAGNOSIS — I4821 Permanent atrial fibrillation: Secondary | ICD-10-CM | POA: Diagnosis not present

## 2019-06-08 DIAGNOSIS — E782 Mixed hyperlipidemia: Secondary | ICD-10-CM

## 2019-06-08 LAB — CBC: RBC: 3.65 — AB (ref 3.87–5.11)

## 2019-06-08 LAB — BASIC METABOLIC PANEL
BUN: 14 (ref 4–21)
CO2: 26 — AB (ref 13–22)
Chloride: 103 (ref 99–108)
Creatinine: 0.5 (ref 0.5–1.1)
Glucose: 94
Potassium: 4.1 (ref 3.4–5.3)
Sodium: 138 (ref 137–147)

## 2019-06-08 LAB — CBC AND DIFFERENTIAL
HCT: 31 — AB (ref 36–46)
Hemoglobin: 10.5 — AB (ref 12.0–16.0)
Platelets: 232 (ref 150–399)
WBC: 6.6

## 2019-06-08 LAB — COMPREHENSIVE METABOLIC PANEL
Calcium: 9.4 (ref 8.7–10.7)
GFR calc Af Amer: >90
GFR calc non Af Amer: 82.07

## 2019-06-08 MED ORDER — OXYCODONE HCL 5 MG PO TABS: 5.0000 mg | ORAL_TABLET | Freq: Four times a day (QID) | ORAL | 0 refills | Status: DC | PRN

## 2019-06-08 NOTE — Telephone Encounter (Signed)
Pt was on TCM report admitted 05/29/19 for right lower extremity cellulitis that failed outpatient antibiotics, undetermined source of suspected epistaxis versus other cause of bleeding. Had  GI and ENT consult while in hosp. Pt D/C to SNF 06/06/19, and will follow-up with SNF provider in 3 days. Pt will also f/u w/ Dr. Leta Baptist (Otolaryngology in 1 week after leaving SNF.Marland KitchenJohny Chess

## 2019-06-08 NOTE — Progress Notes (Signed)
A user error has taken place: encounter opened in error, closed for administrative reasons.

## 2019-06-08 NOTE — Progress Notes (Signed)
: Provider:  Noah Delaine. [name], MD Location:  [facility] Room Number: [id number] Place of Service:  SNF ([phone])  PCP: Hoyt Koch, MD Patient Care Team: Hoyt Koch, MD as PCP - General (Internal Medicine)  Extended Emergency Contact Information Primary Emergency Contact: [facility] Address: [address]          [place] [address] Johnnette Litter of [place] Phone: [phone] Relation: Son     Allergies: Codeine and Penicillins  Chief Complaint  Patient presents with  . New Admit To SNF    Admit to SNF    HPI: Patient is 86 y.o. female/permanent atrial fibrillation on Eliquis, prior stroke, hyperlipidemia, hypertension, DVT/PE who presented to [facility] with worsening right lower extremity pain and suspected epistaxis.  Lower extremity pain started 8 days prior and patient was started on empiric antibiotics by PCP for suspected cellulitis, but she has not improved.  4 days ago she developed what appeared to be epistaxis came to the ED was seen by ENT surgeon who follow her up in the clinic she underwent cauterization 2 days prior to arrival.  She had been seen by her PCP 1 day prior to her arrival for lower extremity pain and she started having epistaxis again and was referred to the ED for further evaluation and management patient was admitted to [facility] from 1/19-27 for right lower extremity cellulitis and suspected epistaxis.  GI and ENT was consulted.  Patient was treated with IV vancomycin and cefepime for 4 days then transition to oral Keflex and doxycycline.  For nosebleed ENT did work-up including endoscopy and no source of bleeding was found.  Patient is admitted to skilled nursing facility for OT/PT.  While at skilled nursing facility patient will be followed for pulmonary atrial fibrillation not requiring rate control and treatment with Eliquis, hypertension treated with hydrochlorothiazide and  hyperlipidemia treated with Pravachol.  Past Medical History:  Diagnosis Date  . Anxiety state, unspecified   . Calculus of kidney   . Cerebral aneurysm, nonruptured   . Colon polyp   . Complication of anesthesia    "my heart stopped" 2004 knee  surg - see note on chart  . DEEP VENOUS THROMBOPHLEBITIS 08/15/2007   Qualifier: History of  By: Lenna Gilford MD, Deborra Medina   . Diverticulosis of colon (without mention of hemorrhage)   . History of skin cancer   . HTN (hypertension)   . Hyperlipidemia   . Lumbago   . Osteoarthrosis, unspecified whether generalized or localized, unspecified site   . Permanent atrial fibrillation ([place])   . Pulmonary emboli ([place])    following remote knee arthroscopy surgery  . Ruptured lumbar disc   . Sleep apnea    stop bang score 4   . Unspecified hemorrhoids without mention of complication     Past Surgical History:  Procedure Laterality Date  . ABDOMINAL HYSTERECTOMY    . APPENDECTOMY    . BLOCKED INTESTINE SURGERY    . BREAST CYST EXCISION    . ESOPHAGOGASTRODUODENOSCOPY (EGD) WITH PROPOFOL N/A 05/30/2019   Procedure: ESOPHAGOGASTRODUODENOSCOPY (EGD) WITH PROPOFOL;  Surgeon: Thornton Park, MD;  Location: [place];  Service: Gastroenterology;  Laterality: N/A;  . HEMHORROIDECTOMY    . JOINT REPLACEMENT  2004   RT TOTAL KNEE  . KNEE ARTHROSCOPY     X2 L KNEE  . KNEE SURGERY    . NASAL SURGERY X2    . TONSILLECTOMY AND ADENOIDECTOMY    .  TOTAL KNEE ARTHROPLASTY  09/03/2011   Procedure: TOTAL KNEE ARTHROPLASTY;  Surgeon: Gearlean Alf, MD;  Location: [facility];  Service: Orthopedics;  Laterality: Left;    Allergies as of 06/08/2019      Reactions   Codeine    REACTION: causes nausea   Penicillins    REACTION: rash      Medication List       Accurate as of June 08, 2019  2:48 PM. If you have any questions, ask your nurse or doctor.        STOP taking these medications   cycloSPORINE 0.05 % ophthalmic emulsion Commonly known as:  RESTASIS Stopped by: Inocencio Homes, MD     TAKE these medications   acetaminophen 325 MG tablet Commonly known as: TYLENOL Take 2 tablets (650 mg total) by mouth every 6 (six) hours as needed for mild pain, moderate pain or fever (or Fever >/= 101).   apixaban 5 MG Tabs tablet Commonly known as: Eliquis TAKE 1 TABLET (5 MG TOTAL) BY MOUTH 2 (TWO) TIMES DAILY.   cephALEXin 500 MG capsule Commonly known as: KEFLEX Take 1 capsule (500 mg total) by mouth in the morning, at noon, in the evening, and at bedtime. Discontinue after today i.e. 06/06/2019 doses.   doxycycline 100 MG tablet Commonly known as: ADOXA Take 1 tablet (100 mg total) by mouth 2 (two) times daily. Discontinue after today i.e. 06/06/2019 doses.   fluticasone 50 MCG/ACT nasal spray Commonly known as: FLONASE Place 2 sprays into both nostrils daily.   gabapentin 100 MG capsule Commonly known as: NEURONTIN Take 3 capsules (300 mg total) by mouth 3 (three) times daily.   hydrochlorothiazide 25 MG tablet Commonly known as: HYDRODIURIL Take 0.5 tablets (12.5 mg total) by mouth daily.   NON FORMULARY DIET: REGULAR, NAS, HEART HEALTHY   oxyCODONE 5 MG immediate release tablet Commonly known as: Oxy IR/ROXICODONE Take 1 tablet (5 mg total) by mouth every 6 (six) hours as needed for severe pain. For 14 days What changed:   when to take this  additional instructions Changed by: Inocencio Homes, MD   polyethylene glycol 17 g packet Commonly known as: MIRALAX / GLYCOLAX Take 17 g by mouth as needed for mild constipation.   pravastatin 20 MG tablet Commonly known as: PRAVACHOL TAKE 1 TABLET (20 MG TOTAL) BY MOUTH DAILY AT 6 PM.   sodium chloride 0.65 % Soln nasal spray Commonly known as: OCEAN Place 1 spray into both nostrils as needed for congestion.   Vasculera Tabs Take 630 mg by mouth daily.       No orders of the defined types were placed in this encounter.   Immunization History  Administered  Date(s) Administered  . Fluad Quad(high Dose 65+) 03/26/2019  . Influenza Split 02/03/2011  . Influenza Whole 02/07/2008, 01/19/2010  . Influenza,inj,Quad PF,6+ Mos 02/21/2013, 03/07/2014  . Pneumococcal Polysaccharide-23 02/03/2011    Social History   Tobacco Use  . Smoking status: Never Smoker  . Smokeless tobacco: Never Used  Substance Use Topics  . Alcohol use: Yes    Alcohol/week: 0.0 standard drinks    Comment: Rarely    Family history is   Family History  Problem Relation Age of Onset  . Coronary artery disease Father   . Stroke Father   . Cirrhosis Brother   . Colon cancer Son   . Colon polyps Neg Hx   . Kidney disease Neg Hx   . Diabetes Neg Hx  Review of Systems  GENERAL:  no fevers, fatigue, appetite changes SKIN: No itching, or rash EYES: No eye pain, redness, discharge EARS: No earache, tinnitus, change in hearing NOSE: No congestion, drainage or bleeding  MOUTH/THROAT: No mouth or tooth pain, No sore throat RESPIRATORY: No cough, wheezing, SOB CARDIAC: No chest pain, palpitations, lower extremity edema  GI: No abdominal pain, No N/V/D or constipation, No heartburn or reflux  GU: No dysuria, frequency or urgency, or incontinence  MUSCULOSKELETAL: No unrelieved bone/joint pain NEUROLOGIC: No headache, dizziness or focal weakness PSYCHIATRIC: No c/o anxiety or sadness   Vitals:   06/08/19 1335  BP: 123/69  Pulse: 85  Resp: 18  Temp: (!) 97.5 F (36.4 C)  SpO2: 98%    SpO2 Readings from Last 1 Encounters:  06/08/19 98%   Body mass index is 30.17 kg/m.     Physical Exam  GENERAL APPEARANCE: Alert, conversant,  No acute distress.  SKIN: Resolving redness but heat right lower extremity ankle and foot HEAD: Normocephalic, atraumatic  EYES: Conjunctiva/lids clear. Pupils round, reactive. EOMs intact.  EARS: External exam WNL, canals clear. Hearing grossly normal.  NOSE: No deformity or discharge.  MOUTH/THROAT: Lips w/o lesions    RESPIRATORY: Breathing is even, unlabored. Lung sounds are clear   CARDIOVASCULAR: Heart irregular no murmurs, rubs or gallops. No peripheral edema.   GASTROINTESTINAL: Abdomen is soft, non-tender, not distended w/ normal bowel sounds. GENITOURINARY: Bladder non tender, not distended  MUSCULOSKELETAL: No abnormal joints or musculature NEUROLOGIC:  Cranial nerves 2-12 grossly intact. Moves all extremities  PSYCHIATRIC: Mood and affect appropriate to situation, no behavioral issues  Patient Active Problem List   Diagnosis Date Noted  . Cellulitis, leg 05/30/2019  . Epistaxis 05/30/2019  . Gastritis and gastroduodenitis   . Bleeding 05/29/2019  . Venous stasis ulcer limited to breakdown of skin without varicose veins ([place]) 05/17/2019  . Cellulitis of right lower extremity 05/17/2019  . Ulcer of right lower extremity, limited to breakdown of skin ([place]) 05/17/2019  . Acute post-traumatic headache, not intractable 12/21/2018  . Hot flashes 02/09/2017  . Adjustment disorder with mixed anxiety and depressed mood 06/28/2016  . CVA (cerebral vascular accident) ([place]) 06/26/2016  . Right homonymous hemianopsia 06/26/2016  . Back pain 04/08/2015  . Abnormal CT scan 04/08/2015  . Constipation 01/02/2015  . Intracranial aneurysm 10/18/2014  . Routine general medical examination at a health care facility 03/08/2014  . Occipital neuralgia 07/16/2013  . Venous insufficiency 07/11/2013  . Alopecia 06/08/2012  . Osteopenia 02/21/2010  . ANXIETY 05/21/2009  . Essential hypertension 05/21/2009  . Atrial fibrillation - followed by Dr. Caryl Comes 03/03/2009  . INTRACRANIAL ANEURYSM 08/16/2007  . Hyperlipidemia 08/15/2007  . GERD 08/15/2007      Labs reviewed: Basic Metabolic Panel:    Component Value Date/Time   NA 139 06/05/2019 0307   K 4.0 06/05/2019 0307   CL 104 06/05/2019 0307   CO2 26 06/05/2019 0307   GLUCOSE 109 (H) 06/05/2019 0307   BUN 16 06/05/2019 0307   CREATININE 0.74  06/05/2019 0307   CALCIUM 9.5 06/05/2019 0307   PROT 7.8 12/21/2018 1332   ALBUMIN 4.7 12/21/2018 1332   AST 14 12/21/2018 1332   ALT 10 12/21/2018 1332   ALKPHOS 64 12/21/2018 1332   BILITOT 0.6 12/21/2018 1332   GFRNONAA >60 06/05/2019 0307   GFRAA >60 06/05/2019 0307    Recent Labs    05/31/19 0323 06/01/19 0507 06/05/19 0307  NA 139 138 139  K 4.1 3.9  4.0  CL 106 106 104  CO2 23 25 26   GLUCOSE 138* 108* 109*  BUN 14 11 16   CREATININE 0.60 0.56 0.74  CALCIUM 9.1 8.8* 9.5   Liver Function Tests: Recent Labs    12/21/18 1332  AST 14  ALT 10  ALKPHOS 64  BILITOT 0.6  PROT 7.8  ALBUMIN 4.7   No results for input(s): LIPASE, AMYLASE in the last 8760 hours. No results for input(s): AMMONIA in the last 8760 hours. CBC: Recent Labs    05/29/19 2241 05/29/19 2241 05/30/19 0700 05/30/19 0700 05/31/19 0323 06/01/19 0507 06/04/19 0219 06/05/19 0307 06/06/19 0148  WBC 9.6  --  7.8  --  8.4  --   --   --   --   NEUTROABS 6.0  --  5.1  --   --   --   --   --   --   HGB 11.9*   < > 11.3*   < > 10.6*   < > 10.3* 10.6* 10.2*  HCT 37.5   < > 34.5*   < > 33.6*   < > 32.8* 33.0* 32.4*  MCV 90.1  --  88.7  --  88.2  --   --   --   --   PLT 240  --  218  --  211  --   --   --   --    < > = values in this interval not displayed.   Lipid Recent Labs    12/21/18 1332  CHOL 136  HDL 54.50  LDLCALC 61  TRIG 103.0    Cardiac Enzymes: No results for input(s): CKTOTAL, CKMB, CKMBINDEX, TROPONINI in the last 8760 hours. BNP: No results for input(s): BNP in the last 8760 hours. No results found for: [facility] Lab Results  Component Value Date   HGBA1C 6.3 (H) 06/27/2016   Lab Results  Component Value Date   TSH 1.89 02/09/2018   Lab Results  Component Value Date   VITAMINB12 187 (L) 02/09/2018   No results found for: FOLATE No results found for: IRON, TIBC, FERRITIN  Imaging and Procedures obtained prior to SNF admission: DG Tibia/Fibula Right  Result  Date: 05/30/2019 CLINICAL DATA:  Infection. Cellulitis. EXAM: RIGHT TIBIA AND FIBULA - 2 VIEW COMPARISON:  May 29, 2007 FINDINGS: The patient is status post total knee arthroplasty. There is nonspecific subcutaneous edema involving the right lower extremity. Multiple calcifications are noted in the subcutaneous soft tissues which may related to chronic venous insufficiency. There is no radiographic evidence for osteomyelitis. There is no acute displaced fracture. There are no pockets of subcutaneous gas. IMPRESSION: 1. No radiographic evidence for osteomyelitis. 2. Nonspecific subcutaneous edema involving the right lower extremity. This can be seen in patients with cellulitis in the appropriate clinical setting. 3. Soft tissue calcifications, likely related to chronic venous insufficiency. Electronically Signed   By: Constance Holster M.D.   On: 05/30/2019 suggest continue gabapentin for neuropathic pain try to keep the opioids to a minimum SNF-patient admitted for OT/PT; leg is still little red and warm so we will continue antibiotics out another 3 days for total of 10 days; trace edema will monitor; her right lower extremity leg pain will increase Neurontin from 300 3 times daily to 600 mg 3 times daily care guide  Epistaxis-source unclear ENT.  Flexible fiberoptic laryngoscopy should not show any bleeding from nasal cavities or nasopharynx although blood clots were noted in the hypopharynx.  [facility] was consulted  and EGD was normal esophagus erythematous gastric fundus and body which is doing obvious diagnosis of bleeding; after discussion with ENT of possible resumed on 1/23.  On 1/26 patient had recurrent episodes of bleeding which again resolved without any blood in the anterior nares or posterior pharynx.  Per ENT during last endoscopic evaluation several were; Dr. Kemper Durie unsure as to the source of bleeding SNF-we will monitor; check CBC  Permanent atrial fibrillation SNF not requiring continue  Eliquis 5 mg twice daily  Hypertension SNF-controlled; continue hydrochlorothiazide 12.5 mg daily  Hyperlipidemia SNF-no stabilizing glucose; continue Pravachol 20 mg daily  History of CVA SNF-continue Eliquis 5 mg twice daily and statin    Not all labs, radiology exams or other studies done during hospitalization come through on my EPIC note; however they are reviewed by me.    Assessment and Plan  Right lower extremity cellulitis-patient wound 4 days of IV cefepime after being started on vancomycin and cefepime and was transitioned to Keflex and doxycycline.continue gabapentin for neuropathic pain try to keep the opioids to a minimum SNF-patient admitted for OT/PT; leg is still little red and warm so we will continue antibiotics out another 3 days for total of 10 days; trace edema will monitor; her right lower extremity leg pain will increase Neurontin from 300 3 times daily to 600 mg 3 times daily care guide  Epistaxis-source unclear ENT.  Flexible fiberoptic laryngoscopy should not show any bleeding from nasal cavities or nasopharynx although blood clots were noted in the hypopharynx.  [facility] was consulted and EGD was normal esophagus erythematous gastric fundus and body which is doing obvious diagnosis of bleeding; after discussion with ENT of possible resumed on 1/23.  On 1/26 patient had recurrent episodes of bleeding which again resolved without any blood in the anterior nares or posterior pharynx.  Per ENT during last endoscopic evaluation several were; Dr. Kemper Durie unsure as to the source of bleeding SNF-we will monitor; check CBC  Permanent atrial fibrillation SNF-not requiring rate; continue Eliquis 5 mg twice daily  Hypertension SNF-controlled; continue hydrochlorothiazide 12.5 mg daily  Hyperlipidemia SNF-no stabilizing glucose; continue Pravachol 20 mg daily  History of CVA SNF-continue Eliquis 5 mg twice daily and statin   ) Greater than 45 minutes;> 50% of time  with patient was spent reviewing records, labs, tests and studies, counseling and developing plan of care  Hennie Duos, MD

## 2019-06-09 ENCOUNTER — Encounter: Payer: Self-pay | Admitting: Internal Medicine

## 2019-06-09 DIAGNOSIS — Z8673 Personal history of transient ischemic attack (TIA), and cerebral infarction without residual deficits: Secondary | ICD-10-CM | POA: Insufficient documentation

## 2019-06-11 ENCOUNTER — Other Ambulatory Visit: Payer: Self-pay | Admitting: Internal Medicine

## 2019-06-11 MED ORDER — TRAMADOL HCL 50 MG PO TABS: 50.0000 mg | ORAL_TABLET | Freq: Two times a day (BID) | ORAL | 0 refills | Status: DC

## 2019-06-12 ENCOUNTER — Other Ambulatory Visit: Payer: Self-pay

## 2019-06-12 ENCOUNTER — Encounter (HOSPITAL_BASED_OUTPATIENT_CLINIC_OR_DEPARTMENT_OTHER): Payer: Medicare Other | Admitting: Internal Medicine

## 2019-06-12 DIAGNOSIS — I87311 Chronic venous hypertension (idiopathic) with ulcer of right lower extremity: Secondary | ICD-10-CM | POA: Insufficient documentation

## 2019-06-12 DIAGNOSIS — G4730 Sleep apnea, unspecified: Secondary | ICD-10-CM | POA: Insufficient documentation

## 2019-06-12 DIAGNOSIS — Z885 Allergy status to narcotic agent status: Secondary | ICD-10-CM | POA: Diagnosis not present

## 2019-06-12 DIAGNOSIS — Z8673 Personal history of transient ischemic attack (TIA), and cerebral infarction without residual deficits: Secondary | ICD-10-CM | POA: Diagnosis not present

## 2019-06-12 DIAGNOSIS — E785 Hyperlipidemia, unspecified: Secondary | ICD-10-CM | POA: Insufficient documentation

## 2019-06-12 DIAGNOSIS — L97311 Non-pressure chronic ulcer of right ankle limited to breakdown of skin: Secondary | ICD-10-CM | POA: Insufficient documentation

## 2019-06-12 DIAGNOSIS — I4891 Unspecified atrial fibrillation: Secondary | ICD-10-CM | POA: Diagnosis not present

## 2019-06-12 DIAGNOSIS — I890 Lymphedema, not elsewhere classified: Secondary | ICD-10-CM | POA: Insufficient documentation

## 2019-06-12 DIAGNOSIS — M1990 Unspecified osteoarthritis, unspecified site: Secondary | ICD-10-CM | POA: Insufficient documentation

## 2019-06-12 DIAGNOSIS — Z86718 Personal history of other venous thrombosis and embolism: Secondary | ICD-10-CM | POA: Insufficient documentation

## 2019-06-12 DIAGNOSIS — Z8249 Family history of ischemic heart disease and other diseases of the circulatory system: Secondary | ICD-10-CM | POA: Diagnosis not present

## 2019-06-12 DIAGNOSIS — Z8719 Personal history of other diseases of the digestive system: Secondary | ICD-10-CM | POA: Diagnosis not present

## 2019-06-12 DIAGNOSIS — S91001A Unspecified open wound, right ankle, initial encounter: Secondary | ICD-10-CM | POA: Diagnosis not present

## 2019-06-12 DIAGNOSIS — Z7901 Long term (current) use of anticoagulants: Secondary | ICD-10-CM | POA: Insufficient documentation

## 2019-06-12 DIAGNOSIS — I872 Venous insufficiency (chronic) (peripheral): Secondary | ICD-10-CM | POA: Diagnosis not present

## 2019-06-12 DIAGNOSIS — K219 Gastro-esophageal reflux disease without esophagitis: Secondary | ICD-10-CM | POA: Insufficient documentation

## 2019-06-12 DIAGNOSIS — Z809 Family history of malignant neoplasm, unspecified: Secondary | ICD-10-CM | POA: Insufficient documentation

## 2019-06-12 DIAGNOSIS — Z880 Allergy status to penicillin: Secondary | ICD-10-CM | POA: Insufficient documentation

## 2019-06-12 DIAGNOSIS — Z96651 Presence of right artificial knee joint: Secondary | ICD-10-CM | POA: Insufficient documentation

## 2019-06-13 ENCOUNTER — Other Ambulatory Visit: Payer: Self-pay | Admitting: *Deleted

## 2019-06-13 NOTE — Patient Outreach (Signed)
Entry made in error

## 2019-06-13 NOTE — Patient Outreach (Signed)
Screened for potential [facility] Care Management needs as a benefit of  [employer].  Ms. Monette is currently receiving skilled therapy at [facility].   Writer attended telephonic interdisciplinary team meeting to assess for disposition needs and transition plan for resident.   Therapy reports member is progressing well with therapy. Will plan  Will plan outreach to Mrs. Esker and/or son to discuss [facility] needs and disposition plans.    Marthenia Rolling, MSN-Ed, RN,BSN [place] Acute Care Coordinator [phone] [facility]) [phone]  (Toll free office)

## 2019-06-14 DIAGNOSIS — J069 Acute upper respiratory infection, unspecified: Secondary | ICD-10-CM | POA: Diagnosis not present

## 2019-06-19 ENCOUNTER — Encounter: Payer: Self-pay | Admitting: Internal Medicine

## 2019-06-19 ENCOUNTER — Non-Acute Institutional Stay (SKILLED_NURSING_FACILITY): Payer: Medicare Other | Admitting: Internal Medicine

## 2019-06-19 ENCOUNTER — Other Ambulatory Visit: Payer: Self-pay | Admitting: Internal Medicine

## 2019-06-19 DIAGNOSIS — M79604 Pain in right leg: Secondary | ICD-10-CM | POA: Diagnosis not present

## 2019-06-19 MED ORDER — TRAMADOL HCL 50 MG PO TABS: 50.0000 mg | ORAL_TABLET | Freq: Two times a day (BID) | ORAL | 0 refills | Status: DC

## 2019-06-19 NOTE — Progress Notes (Signed)
Location:  [place] Room Number: [place] of Service:  SNF (31)  Samantha Bishop. [name], MD   Patient Care Team: Hoyt Koch, MD as PCP - General (Internal Medicine)  Extended Emergency Contact Information Primary Emergency Contact: [facility] Address: [place] [address] [address] [place] Phone: [phone] Relation: Son    Allergies: Codeine and Penicillins  Chief Complaint  Patient presents with   Acute Visit    Right Leg Pain    HPI: Patient is 86 y.o. female who is being seen for right leg pain.  Patient describes the pain as feeling like the muscle being torn off the bone, worse when she puts pressure on it.  He does not have any burning or shooting nature to it.  Patient has just had cellulitis to her right leg and has it currently wrapped.  There is no warmth appreciated  Past Medical History:  Diagnosis Date   Anxiety state, unspecified    Calculus of kidney    Cerebral aneurysm, nonruptured    Colon polyp    Complication of anesthesia    "my heart stopped" 2004 knee  surg - see note on chart   DEEP VENOUS THROMBOPHLEBITIS 08/15/2007   Qualifier: History of  By: Lenna Gilford MD, Deborra Medina    Diverticulosis of colon (without mention of hemorrhage)    History of skin cancer    HTN (hypertension)    Hyperlipidemia    Lumbago    Osteoarthrosis, unspecified whether generalized or localized, unspecified site    Permanent atrial fibrillation ([place])    Pulmonary emboli ([place])    following remote knee arthroscopy surgery   Ruptured lumbar disc    Sleep apnea    stop bang score 4    Unspecified hemorrhoids without mention of complication     Past Surgical History:  Procedure Laterality Date   ABDOMINAL HYSTERECTOMY     APPENDECTOMY     BLOCKED INTESTINE SURGERY     BREAST CYST EXCISION     ESOPHAGOGASTRODUODENOSCOPY (EGD) WITH PROPOFOL N/A 05/30/2019   Procedure:  ESOPHAGOGASTRODUODENOSCOPY (EGD) WITH PROPOFOL;  Surgeon: Thornton Park, MD;  Location: [place];  Service: Gastroenterology;  Laterality: N/A;   HEMHORROIDECTOMY     JOINT REPLACEMENT  2004   RT TOTAL KNEE   KNEE ARTHROSCOPY     X2 L KNEE   KNEE SURGERY     NASAL SURGERY X2     TONSILLECTOMY AND ADENOIDECTOMY     TOTAL KNEE ARTHROPLASTY  09/03/2011   Procedure: TOTAL KNEE ARTHROPLASTY;  Surgeon: Gearlean Alf, MD;  Location: [facility];  Service: Orthopedics;  Laterality: Left;    Allergies as of 06/19/2019      Reactions   Codeine    REACTION: causes nausea   Penicillins    REACTION: rash      Medication List       Accurate as of June 19, 2019  4:46 PM. If you have any questions, ask your nurse or doctor.        STOP taking these medications   cephALEXin 500 MG capsule Commonly known as: KEFLEX Stopped by: Inocencio Homes, MD     TAKE these medications   acetaminophen 325 MG tablet Commonly known as: TYLENOL Take 2 tablets (650 mg total) by mouth every 6 (six) hours as needed for mild pain, moderate pain or fever (or Fever >/= 101).   apixaban 5 MG  Tabs tablet Commonly known as: Eliquis TAKE 1 TABLET (5 MG TOTAL) BY MOUTH 2 (TWO) TIMES DAILY.   doxycycline 100 MG tablet Commonly known as: ADOXA Take 1 tablet (100 mg total) by mouth 2 (two) times daily. Discontinue after today i.e. 06/06/2019 doses.   fluticasone 50 MCG/ACT nasal spray Commonly known as: FLONASE Place 2 sprays into both nostrils daily.   gabapentin 300 MG capsule Commonly known as: NEURONTIN Take 300 mg by mouth 3 (three) times daily. What changed: Another medication with the same name was removed. Continue taking this medication, and follow the directions you see here. Changed by: Inocencio Homes, MD   hydrochlorothiazide 25 MG tablet Commonly known as: HYDRODIURIL Take 0.5 tablets (12.5 mg total) by mouth daily.   NON FORMULARY DIET: REGULAR, NAS, HEART HEALTHY   oxyCODONE  5 MG immediate release tablet Commonly known as: Oxy IR/ROXICODONE Take 5 mg by mouth every 8 (eight) hours as needed for severe pain. What changed: Another medication with the same name was removed. Continue taking this medication, and follow the directions you see here. Changed by: Inocencio Homes, MD   polyethylene glycol 17 g packet Commonly known as: MIRALAX / GLYCOLAX Take 17 g by mouth as needed for mild constipation.   pravastatin 20 MG tablet Commonly known as: PRAVACHOL TAKE 1 TABLET (20 MG TOTAL) BY MOUTH DAILY AT 6 PM.   sodium chloride 0.65 % Soln nasal spray Commonly known as: OCEAN Place 1 spray into both nostrils as needed for congestion.   traMADol 50 MG tablet Commonly known as: ULTRAM Take 1 tablet (50 mg total) by mouth 2 (two) times daily for 20 days.   Vasculera Tabs Take 630 mg by mouth daily.       No orders of the defined types were placed in this encounter.   Immunization History  Administered Date(s) Administered   Fluad Quad(high Dose 65+) 03/26/2019   Influenza Split 02/03/2011   Influenza Whole 02/07/2008, 01/19/2010   Influenza,inj,Quad PF,6+ Mos 02/21/2013, 03/07/2014   Pneumococcal Polysaccharide-23 02/03/2011    Social History   Tobacco Use   Smoking status: Never Smoker   Smokeless tobacco: Never Used  Substance Use Topics   Alcohol use: Yes    Alcohol/week: 0.0 standard drinks    Comment: Rarely    Review of Systems  GENERAL:  no fevers, fatigue, appetite changes SKIN: No itching, rash HEENT: No complaint RESPIRATORY: No cough, wheezing, SOB CARDIAC: No chest pain, palpitations, lower extremity edema  GI: No abdominal pain, No N/V/D or constipation, No heartburn or reflux  GU: No dysuria, frequency or urgency, or incontinence  MUSCULOSKELETAL: Pain in right leg NEUROLOGIC: No headache, dizziness  PSYCHIATRIC: No overt anxiety or sadness  Vitals:   06/19/19 1637  BP: 125/79  Pulse: 80  Resp: 18  Temp: 97.8  F (36.6 C)  SpO2: 98%   Body mass index is 29.89 kg/m. Physical Exam  GENERAL APPEARANCE: Alert, conversant, No acute distress  SKIN: No diaphoresis rash HEENT: Unremarkable RESPIRATORY: Breathing is even, unlabored. Lung sounds are clear   CARDIOVASCULAR: Heart RRR no murmurs, rubs or gallops. No peripheral edema  GASTROINTESTINAL: Abdomen is soft, non-tender, not distended w/ normal bowel sounds.  GENITOURINARY: Bladder non tender, not distended  MUSCULOSKELETAL: Right leg wrapped; no heat or swelling appreciated; no muscle tender NEUROLOGIC: Cranial nerves 2-12 grossly intact. Moves all extremities PSYCHIATRIC: Mood and affect appropriate to situation, no behavioral issues  Patient Active Problem List   Diagnosis Date Noted   History  of CVA (cerebrovascular accident) 06/09/2019   Cellulitis, leg 05/30/2019   Epistaxis 05/30/2019   Gastritis and gastroduodenitis    Bleeding 05/29/2019   Venous stasis ulcer limited to breakdown of skin without varicose veins (HCC) 05/17/2019   Cellulitis of right lower extremity 05/17/2019   Ulcer of right lower extremity, limited to breakdown of skin ([place]) 05/17/2019   Acute post-traumatic headache, not intractable 12/21/2018   Hot flashes 02/09/2017   Adjustment disorder with mixed anxiety and depressed mood 06/28/2016   CVA (cerebral vascular accident) ([place]) 06/26/2016   Right homonymous hemianopsia 06/26/2016   Back pain 04/08/2015   Abnormal CT scan 04/08/2015   Constipation 01/02/2015   Intracranial aneurysm 10/18/2014   Routine general medical examination at a health care facility 03/08/2014   Occipital neuralgia 07/16/2013   Venous insufficiency 07/11/2013   Alopecia 06/08/2012   Osteopenia 02/21/2010   ANXIETY 05/21/2009   Essential hypertension 05/21/2009   Atrial fibrillation - followed by Dr. Caryl Comes 03/03/2009   INTRACRANIAL ANEURYSM 08/16/2007   Hyperlipidemia 08/15/2007   GERD 08/15/2007     CMP     Component Value Date/Time   NA 139 06/05/2019 0307   K 4.0 06/05/2019 0307   CL 104 06/05/2019 0307   CO2 26 06/05/2019 0307   GLUCOSE 109 (H) 06/05/2019 0307   BUN 16 06/05/2019 0307   CREATININE 0.74 06/05/2019 0307   CALCIUM 9.5 06/05/2019 0307   PROT 7.8 12/21/2018 1332   ALBUMIN 4.7 12/21/2018 1332   AST 14 12/21/2018 1332   ALT 10 12/21/2018 1332   ALKPHOS 64 12/21/2018 1332   BILITOT 0.6 12/21/2018 1332   GFRNONAA >60 06/05/2019 0307   GFRAA >60 06/05/2019 0307   Recent Labs    05/31/19 0323 06/01/19 0507 06/05/19 0307  NA 139 138 139  K 4.1 3.9 4.0  CL 106 106 104  CO2 23 25 26   GLUCOSE 138* 108* 109*  BUN 14 11 16   CREATININE 0.60 0.56 0.74  CALCIUM 9.1 8.8* 9.5   Recent Labs    12/21/18 1332  AST 14  ALT 10  ALKPHOS 64  BILITOT 0.6  PROT 7.8  ALBUMIN 4.7   Recent Labs    05/29/19 2241 05/29/19 2241 05/30/19 0700 05/30/19 0700 05/31/19 0323 06/01/19 0507 06/04/19 0219 06/05/19 0307 06/06/19 0148  WBC 9.6  --  7.8  --  8.4  --   --   --   --   NEUTROABS 6.0  --  5.1  --   --   --   --   --   --   HGB 11.9*   < > 11.3*   < > 10.6*   < > 10.3* 10.6* 10.2*  HCT 37.5   < > 34.5*   < > 33.6*   < > 32.8* 33.0* 32.4*  MCV 90.1  --  88.7  --  88.2  --   --   --   --   PLT 240  --  218  --  211  --   --   --   --    < > = values in this interval not displayed.   Recent Labs    12/21/18 1332  CHOL 136  LDLCALC 61  TRIG 103.0   No results found for: [facility] Lab Results  Component Value Date   TSH 1.89 02/09/2018   Lab Results  Component Value Date   HGBA1C 6.3 (H) 06/27/2016   Lab Results  Component Value Date  CHOL 136 12/21/2018   HDL 54.50 12/21/2018   LDLCALC 61 12/21/2018   TRIG 103.0 12/21/2018   CHOLHDL 3 12/21/2018    Significant Diagnostic Results in last 30 days:  DG Tibia/Fibula Right  Result Date: 05/30/2019 CLINICAL DATA:  Infection. Cellulitis. EXAM: RIGHT TIBIA AND FIBULA - 2 VIEW COMPARISON:   May 29, 2007 FINDINGS: The patient is status post total knee arthroplasty. There is nonspecific subcutaneous edema involving the right lower extremity. Multiple calcifications are noted in the subcutaneous soft tissues which may related to chronic venous insufficiency. There is no radiographic evidence for osteomyelitis. There is no acute displaced fracture. There are no pockets of subcutaneous gas. IMPRESSION: 1. No radiographic evidence for osteomyelitis. 2. Nonspecific subcutaneous edema involving the right lower extremity. This can be seen in patients with cellulitis in the appropriate clinical setting. 3. Soft tissue calcifications, likely related to chronic venous insufficiency. Electronically Signed   By: Constance Holster M.D.   On: 05/30/2019 00:55   CT SOFT TISSUE NECK W CONTRAST  Result Date: 06/06/2019 CLINICAL DATA:  Tonsil/adenoid disorder. Additional history provided: Reported recurrent epistaxis of unclear source despite extensive evaluation by ENT in GI, rule out ENT source for bleeding. Additional history provided by technologist: Patient reports developing epistaxis 4 days ago with presentation to the ER, cauterization 2 days ago. EXAM: CT NECK WITH CONTRAST TECHNIQUE: Multidetector CT imaging of the neck was performed using the standard protocol following the bolus administration of intravenous contrast. CONTRAST:  14mL OMNIPAQUE IOHEXOL 350 MG/ML SOLN COMPARISON:  Head CT 05/11/2017. FINDINGS: Pharynx and larynx: Streak artifact from dental restoration limits evaluation of the oral cavity. Within this limitation, no appreciable mass, swelling or vascular lesion is identified within the oral cavity, sinonasal region, pharynx or larynx. Salivary glands: No inflammation, mass, or stone. Thyroid: Subcentimeter right thyroid lobe nodules not meeting consensus criteria for ultrasound follow-up. Lymph nodes: No pathologically enlarged cervical chain lymph nodes. Vascular: The major vascular  structures of the neck appear patent. Calcified plaque within the carotid bifurcations and proximal internal carotid arteries bilaterally. Partially retropharyngeal course of the bilateral ICAs. Limited intracranial: Redemonstrated right parietal, left occipital and left cerebellar encephalomalacia. Calcified plaque within the bilateral carotid artery siphons. Visualized orbits: Bilateral lens replacements. No acute abnormality at the visualized levels. Mastoids and visualized paranasal sinuses: Moderate-sized left maxillary sinus mucous retention cyst. There is otherwise no more than trace scattered paranasal sinus mucosal thickening at the imaged levels. Skeleton: No acute bony abnormality. Cervical spondylosis greatest at C6-C7 where there is advanced disc height loss with endplate spurring and a shallow posterior disc osteophyte. Trace C3-C4 anterolisthesis. Upper chest: No consolidation within the imaged lung apices. Atherosclerotic plaque within the visualized aortic arch and proximal major branch vessels of the neck. IMPRESSION: No specific cause of recurrent epistaxis is identified. No soft tissue neck mass or cervical lymphadenopathy is identified. Moderate-sized left maxillary sinus mucous retention cyst. Atherosclerotic disease. This includes moderate calcified plaque within the carotid bifurcations and proximal internal carotid arteries bilaterally. Electronically Signed   By: Kellie Simmering DO   On: 06/06/2019 11:36   CT TIBIA FIBULA RIGHT W CONTRAST  Result Date: 06/04/2019 CLINICAL DATA:  Right lower leg cellulitis. EXAM: CT OF THE LOWER RIGHT EXTREMITY WITH CONTRAST TECHNIQUE: Multidetector CT imaging of the right tibia and fibula was performed according to the standard protocol following intravenous contrast administration. COMPARISON:  Right tibia and fibula x-rays dated May 30, 2019. CONTRAST:  98mL OMNIPAQUE IOHEXOL 300 MG/ML  SOLN FINDINGS:  Bones/Joint/Cartilage No fracture or dislocation.  No osseous destruction or periosteal reaction. Prior right total knee arthroplasty. Small knee joint effusion. Ligaments Ligaments are suboptimally evaluated by CT. Muscles and Tendons Grossly intact. No muscle atrophy. Soft tissue Mild circumferential skin thickening and subcutaneous edema of the mid to distal lower leg. Innumerable superficial phleboliths. Three-vessel atherosclerotic calcification. No fluid collection or hematoma. No soft tissue mass. IMPRESSION: 1. Mild circumferential skin thickening and subcutaneous edema of the mid to distal lower leg, nonspecific, but can be seen with cellulitis, venous insufficiency, or third spacing. No abscess or CT evidence of osteomyelitis. 2. Prior right total knee arthroplasty with small joint effusion. Electronically Signed   By: Titus Dubin M.D.   On: 06/04/2019 10:22    Assessment and Plan  Pain right leg-does not sound like nerve pain; will schedule tramadol 50 mg twice daily, especially while patient is in rehab     Hennie Duos, MD

## 2019-06-20 ENCOUNTER — Other Ambulatory Visit: Payer: Self-pay | Admitting: *Deleted

## 2019-06-20 NOTE — Patient Outreach (Signed)
Member screened for potential [facility] Care Management needs as a benefit of [place] Medicare.  Ms. Bridgman is receiving skilled therapy at [facility].   Writer left HIPAA compliant voicemail message for member's son Roxie Bergner [phone] requesting call back.  Will plan outreach again to discuss [facility] follow up.    Marthenia Rolling, MSN-Ed, RN,BSN [place] Acute Care Coordinator [phone] [facility]) [phone]  (Toll free office)

## 2019-06-20 NOTE — Patient Outreach (Signed)
Screened for potential [facility] Care Management needs as a benefit of  [employer].  Ms. Haydel is currently receiving skilled therapy at [facility].  Writer attended telephonic interdisciplinary team meeting to assess for disposition needs and transition plan for resident.   Facility reports member is progressing well with therapy. Transition plan is to return home alone. Care plan meeting likely to be scheduled for this Friday.  Will plan outreach to member to discuss [facility] follow up.   Will continue to follow while Ms. Grapes resides in SNF.    Marthenia Rolling, MSN-Ed, RN,BSN [place] Acute Care Coordinator [phone] [facility]) [phone]  (Toll free office)

## 2019-06-21 ENCOUNTER — Other Ambulatory Visit: Payer: Self-pay | Admitting: *Deleted

## 2019-06-21 NOTE — Patient Outreach (Signed)
Member screened for potential [facility] Care Management needs as a benefit of [place] Medicare.  [name] remains at [facility] for skilled therapy.   Received return call from from son Samantha Bishop [phone]. Patient identifiers confirmed.  Samantha Bishop reports he is not sure when his mother will dc from SNF. States the transition plan will be for her to return home alone however. He assists member as needed. Endorses that [name] had a medical alert system in the past but decided she did not want to wear it around her neck. Mentioned considering a camera type system due to member living alone.   Samantha Bishop reports member uses [employer]. Duffy Rhody to please follow up with [employer] regarding time frame of meals restarting after member returns home. Samantha Bishop is agreeable to doing this.  Discussed if [employer] takes 2 to 3 weeks to restart, Probation officer will make referral or [facility] SW for [facility] meals program. Samantha Bishop expresses understanding.   Samantha Bishop states [name] recently started going to the [facility] for lower extremity cellulitis. Discussed [facility] referral due to member having an increase risk of readmission due to ongoing treatment for cellulitis and living alone. Samantha Bishop is agreeable.   Discussed [facility] Care Management services and [facility] will not interfere or replace services provided by home health at dc. Samantha Bishop expresses understanding.   Will email [facility] Care Management and writer's contact information.   Will plan to make referral to [facility]. Will plan to make referral to [facility] SW upon SNF dc if meals are needed.   Will continue to follow for transition plans while Mrs. Filter resides in SNF.    Marthenia Rolling, MSN-Ed, RN,BSN [place] Acute Care Coordinator [phone] [facility]) [phone]  (Toll free office)

## 2019-06-24 ENCOUNTER — Encounter: Payer: Self-pay | Admitting: Internal Medicine

## 2019-06-25 ENCOUNTER — Other Ambulatory Visit: Payer: Self-pay | Admitting: *Deleted

## 2019-06-25 ENCOUNTER — Encounter: Payer: Self-pay | Admitting: Internal Medicine

## 2019-06-25 ENCOUNTER — Non-Acute Institutional Stay (SKILLED_NURSING_FACILITY): Payer: Medicare Other | Admitting: Internal Medicine

## 2019-06-25 DIAGNOSIS — I10 Essential (primary) hypertension: Secondary | ICD-10-CM

## 2019-06-25 DIAGNOSIS — L03115 Cellulitis of right lower limb: Secondary | ICD-10-CM

## 2019-06-25 DIAGNOSIS — I4821 Permanent atrial fibrillation: Secondary | ICD-10-CM | POA: Diagnosis not present

## 2019-06-25 DIAGNOSIS — R040 Epistaxis: Secondary | ICD-10-CM | POA: Diagnosis not present

## 2019-06-25 DIAGNOSIS — M79604 Pain in right leg: Secondary | ICD-10-CM | POA: Diagnosis not present

## 2019-06-25 NOTE — Progress Notes (Signed)
Location:    [facility] Room Number: 105/P Place of Service:  SNF [phone]) Provider:  Bennie Dallas, MD  Patient Care Team: Hoyt Koch, MD as PCP - General (Internal Medicine)  Extended Emergency Contact Information Primary Emergency Contact: [facility] Address: [place]          [place] [address] [address] of [place] Phone: [phone] Relation: Son  Code Status:  Full Code Goals of care: Advanced Directive information Advanced Directives 06/25/2019  Does Patient Have a Medical Advance Directive? Yes  Type of Advance Directive (No Data)  Does patient want to make changes to medical advance directive? No - Patient declined  Would patient like information on creating a medical advance directive? -  Pre-existing out of facility DNR order (yellow form or pink MOST form) -     Chief Complaint  Patient presents with  . Follow-up    Follow Up HTN    HPI:  Pt is a 86 y.o. female seen today for an acute visit for an elevated blood pressure reading that was apparently taken by the machine. Patient is here for rehab after hospitalization for right lower extremity cellulitis treated aggressively with antibiotics.  She also had epistaxis.  ENT did a work-up including an endoscopy no source of bleeding was found.  There have been no further episodes in the facility.  She does have a history of hypertension and continues on hydrochlorothiazide 12.5 mg a day.  Nursing noted a machine reading today was 183/96 which is quite uncharacteristic for her blood pressure readings.  I took it manually after she had just worked with therapy and it was 138/70.  Previous blood pressures ICR largely in the 120s area 120/71-122/60-125/79.  She does not complain of any headache dizziness increased weakness or chest pain.  I suspect this may have been more of a machine abnormality.  In regards to her other issues  she did complain recently of leg pain and Dr. [name] did prescribe tramadol 50 mg twice daily and this appears to be helping.  She does continue on Eliquis as well for a history of atrial fibrillation she is also on this for a history of DVT and pulmonary embolism in the past.  Currently she is sitting on the side of her bed comfortably-- again she is working with therapy and appears to be doing well she is in good spirits.      Past Medical History:  Diagnosis Date  . Anxiety state, unspecified   . Calculus of kidney   . Cerebral aneurysm, nonruptured   . Colon polyp   . Complication of anesthesia    "my heart stopped" 2004 knee  surg - see note on chart  . DEEP VENOUS THROMBOPHLEBITIS 08/15/2007   Qualifier: History of  By: Lenna Gilford MD, Deborra Medina   . Diverticulosis of colon (without mention of hemorrhage)   . History of skin cancer   . HTN (hypertension)   . Hyperlipidemia   . Lumbago   . Osteoarthrosis, unspecified whether generalized or localized, unspecified site   . Permanent atrial fibrillation ([place])   . Pulmonary emboli ([place])    following remote knee arthroscopy surgery  . Ruptured lumbar disc   . Sleep apnea    stop bang score 4   . Unspecified hemorrhoids without mention of complication    Past Surgical History:  Procedure Laterality Date  . ABDOMINAL HYSTERECTOMY    . APPENDECTOMY    .  BLOCKED INTESTINE SURGERY    . BREAST CYST EXCISION    . ESOPHAGOGASTRODUODENOSCOPY (EGD) WITH PROPOFOL N/A 05/30/2019   Procedure: ESOPHAGOGASTRODUODENOSCOPY (EGD) WITH PROPOFOL;  Surgeon: Thornton Park, MD;  Location: [facility];  Service: Gastroenterology;  Laterality: N/A;  . HEMHORROIDECTOMY    . JOINT REPLACEMENT  2004   RT TOTAL KNEE  . KNEE ARTHROSCOPY     X2 L KNEE  . KNEE SURGERY    . NASAL SURGERY X2    . TONSILLECTOMY AND ADENOIDECTOMY    . TOTAL KNEE ARTHROPLASTY  09/03/2011   Procedure: TOTAL KNEE ARTHROPLASTY;  Surgeon: Gearlean Alf, MD;  Location: [facility];   Service: Orthopedics;  Laterality: Left;    Allergies  Allergen Reactions  . Codeine     REACTION: causes nausea  . Penicillins     REACTION: rash    Outpatient Encounter Medications as of 06/25/2019  Medication Sig  . acetaminophen (TYLENOL) 325 MG tablet Take 2 tablets (650 mg total) by mouth every 6 (six) hours as needed for mild pain, moderate pain or fever (or Fever >/= 101).  Marland Kitchen apixaban (ELIQUIS) 5 MG TABS tablet TAKE 1 TABLET (5 MG TOTAL) BY MOUTH 2 (TWO) TIMES DAILY.  . cycloSPORINE (RESTASIS) 0.05 % ophthalmic emulsion Place 1 drop into both eyes as needed (For Dry Eyes).  . Dietary Management Product (VASCULERA) TABS Take 630 mg by mouth daily.  . fluticasone (FLONASE) 50 MCG/ACT nasal spray Place 2 sprays into both nostrils daily.  Marland Kitchen gabapentin (NEURONTIN) 300 MG capsule Take 600 mg by mouth 3 (three) times daily.   . hydrochlorothiazide (HYDRODIURIL) 25 MG tablet Take 0.5 tablets (12.5 mg total) by mouth daily.  . NON FORMULARY DIET: REGULAR, NAS, HEART HEALTHY  . polyethylene glycol (MIRALAX / GLYCOLAX) 17 g packet Take 17 g by mouth as needed for mild constipation.  . pravastatin (PRAVACHOL) 20 MG tablet TAKE 1 TABLET (20 MG TOTAL) BY MOUTH DAILY AT 6 PM.  . sodium chloride (OCEAN) 0.65 % SOLN nasal spray Place 1 spray into both nostrils as needed for congestion.  . traMADol (ULTRAM) 50 MG tablet Take 1 tablet (50 mg total) by mouth 2 (two) times daily for 20 days.  . [DISCONTINUED] doxycycline (ADOXA) 100 MG tablet Take 1 tablet (100 mg total) by mouth 2 (two) times daily. Discontinue after today i.e. 06/06/2019 doses.  . [DISCONTINUED] oxyCODONE (OXY IR/ROXICODONE) 5 MG immediate release tablet Take 5 mg by mouth every 8 (eight) hours as needed for severe pain.   No facility-administered encounter medications on file as of 06/25/2019.    Review of Systems   General she is not complaining of any fever or chills.  Skin does not complain of rashes or itching her right  lower leg is currently wrapped this is followed by wound care.  Head ears eyes nose mouth and throat is not complain of visual changes or sore throat.  Respiratory does not complain of shortness of breath or having a cough.  Cardiac does not complain of chest pain or or concerning edema.  GI is not complaining of abdominal pain nausea vomiting diarrhea constipation.  GU is not complaining of dysuria.  Musculoskeletal does not complain of leg pain or joint pain today.  Neurologic does not complain of dizziness headache syncope or numbness.  And psych appears to be in good spirits does not complain of being depressed or anxious  Immunization History  Administered Date(s) Administered  . Fluad Quad(high Dose 65+) 03/26/2019  . Influenza  Split 02/03/2011  . Influenza Whole 02/07/2008, 01/19/2010  . Influenza,inj,Quad PF,6+ Mos 02/21/2013, 03/07/2014  . Pneumococcal Polysaccharide-23 02/03/2011   Pertinent  Health Maintenance Due  Topic Date Due  . PNA vac Low Risk Adult (2 of 2 - PCV13) 02/03/2012  . INFLUENZA VACCINE  Completed  . DEXA SCAN  Completed   Fall Risk  05/17/2019 02/09/2018 02/08/2017 04/16/2016 04/08/2015  Falls in the past year? 0 Yes Yes No No  Comment - - - Emmi Telephone Survey: data to providers prior to load -  Number falls in past yr: 0 2 or more 1 - -  Injury with Fall? 0 Yes Yes - -  Risk for fall due to : Impaired balance/gait - - - -  Follow up Falls evaluation completed - - - -   Functional Status Survey:    Vitals:   06/25/19 1428  BP: (!) 183/96  Pulse: 82  Resp: 17  Temp: 98.3 F (36.8 C)  TempSrc: Oral  SpO2: 98%  Weight: 178 lb 12.8 oz (81.1 kg)  Height: 5\' 6"  (1.676 m)  Updated blood pressure taken manually was 138/70  Body mass index is 28.86 kg/m. Physical Exam   In general this is a pleasant elderly female in no distress sitting comfortably on the side of her bed.  Her skin is warm and dry her right leg is currently  wrapped.  Eyes visual acuity appears intact sclera and conjunctive are clear.  Oropharynx is clear mucous membranes moist.  Chest is clear to auscultation there is no labored breathing.  Heart is regular irregular rate and rhythm she has trace lower extremity edema.  Abdomen is soft nontender with positive bowel sounds.  Musculoskeletal is able to move all extremities x4 again she is working with therapy and appears to be doing well.  Neurologic is grossly intact her speech is clear could not appreciate lateralizing findings.-Cranial nerves are intact  Psych she is alert and oriented very pleasant alert appropriate.    Labs reviewed:  June 08, 2019.  WBC 6.6 hemoglobin 10.5 platelets 232.  Sodium 138 potassium 4.1 BUN 14.4 creatinine 0.54  Recent Labs    05/31/19 0323 06/01/19 0507 06/05/19 0307  NA 139 138 139  K 4.1 3.9 4.0  CL 106 106 104  CO2 23 25 26   GLUCOSE 138* 108* 109*  BUN 14 11 16   CREATININE 0.60 0.56 0.74  CALCIUM 9.1 8.8* 9.5   Recent Labs    12/21/18 1332  AST 14  ALT 10  ALKPHOS 64  BILITOT 0.6  PROT 7.8  ALBUMIN 4.7   Recent Labs    05/29/19 2241 05/29/19 2241 05/30/19 0700 05/30/19 0700 05/31/19 0323 06/01/19 0507 06/04/19 0219 06/05/19 0307 06/06/19 0148  WBC 9.6  --  7.8  --  8.4  --   --   --   --   NEUTROABS 6.0  --  5.1  --   --   --   --   --   --   HGB 11.9*   < > 11.3*   < > 10.6*   < > 10.3* 10.6* 10.2*  HCT 37.5   < > 34.5*   < > 33.6*   < > 32.8* 33.0* 32.4*  MCV 90.1  --  88.7  --  88.2  --   --   --   --   PLT 240  --  218  --  211  --   --   --   --    < > =  values in this interval not displayed.   Lab Results  Component Value Date   TSH 1.89 02/09/2018   Lab Results  Component Value Date   HGBA1C 6.3 (H) 06/27/2016   Lab Results  Component Value Date   CHOL 136 12/21/2018   HDL 54.50 12/21/2018   LDLCALC 61 12/21/2018   TRIG 103.0 12/21/2018   CHOLHDL 3 12/21/2018    Significant Diagnostic  Results in last 30 days:  DG Tibia/Fibula Right  Result Date: 05/30/2019 CLINICAL DATA:  Infection. Cellulitis. EXAM: RIGHT TIBIA AND FIBULA - 2 VIEW COMPARISON:  May 29, 2007 FINDINGS: The patient is status post total knee arthroplasty. There is nonspecific subcutaneous edema involving the right lower extremity. Multiple calcifications are noted in the subcutaneous soft tissues which may related to chronic venous insufficiency. There is no radiographic evidence for osteomyelitis. There is no acute displaced fracture. There are no pockets of subcutaneous gas. IMPRESSION: 1. No radiographic evidence for osteomyelitis. 2. Nonspecific subcutaneous edema involving the right lower extremity. This can be seen in patients with cellulitis in the appropriate clinical setting. 3. Soft tissue calcifications, likely related to chronic venous insufficiency. Electronically Signed   By: Constance Holster M.D.   On: 05/30/2019 00:55   CT SOFT TISSUE NECK W CONTRAST  Result Date: 06/06/2019 CLINICAL DATA:  Tonsil/adenoid disorder. Additional history provided: Reported recurrent epistaxis of unclear source despite extensive evaluation by ENT in GI, rule out ENT source for bleeding. Additional history provided by technologist: Patient reports developing epistaxis 4 days ago with presentation to the ER, cauterization 2 days ago. EXAM: CT NECK WITH CONTRAST TECHNIQUE: Multidetector CT imaging of the neck was performed using the standard protocol following the bolus administration of intravenous contrast. CONTRAST:  61mL OMNIPAQUE IOHEXOL 350 MG/ML SOLN COMPARISON:  Head CT 05/11/2017. FINDINGS: Pharynx and larynx: Streak artifact from dental restoration limits evaluation of the oral cavity. Within this limitation, no appreciable mass, swelling or vascular lesion is identified within the oral cavity, sinonasal region, pharynx or larynx. Salivary glands: No inflammation, mass, or stone. Thyroid: Subcentimeter right thyroid  lobe nodules not meeting consensus criteria for ultrasound follow-up. Lymph nodes: No pathologically enlarged cervical chain lymph nodes. Vascular: The major vascular structures of the neck appear patent. Calcified plaque within the carotid bifurcations and proximal internal carotid arteries bilaterally. Partially retropharyngeal course of the bilateral ICAs. Limited intracranial: Redemonstrated right parietal, left occipital and left cerebellar encephalomalacia. Calcified plaque within the bilateral carotid artery siphons. Visualized orbits: Bilateral lens replacements. No acute abnormality at the visualized levels. Mastoids and visualized paranasal sinuses: Moderate-sized left maxillary sinus mucous retention cyst. There is otherwise no more than trace scattered paranasal sinus mucosal thickening at the imaged levels. Skeleton: No acute bony abnormality. Cervical spondylosis greatest at C6-C7 where there is advanced disc height loss with endplate spurring and a shallow posterior disc osteophyte. Trace C3-C4 anterolisthesis. Upper chest: No consolidation within the imaged lung apices. Atherosclerotic plaque within the visualized aortic arch and proximal major branch vessels of the neck. IMPRESSION: No specific cause of recurrent epistaxis is identified. No soft tissue neck mass or cervical lymphadenopathy is identified. Moderate-sized left maxillary sinus mucous retention cyst. Atherosclerotic disease. This includes moderate calcified plaque within the carotid bifurcations and proximal internal carotid arteries bilaterally. Electronically Signed   By: Kellie Simmering DO   On: 06/06/2019 11:36   CT TIBIA FIBULA RIGHT W CONTRAST  Result Date: 06/04/2019 CLINICAL DATA:  Right lower leg cellulitis. EXAM: CT OF THE LOWER RIGHT EXTREMITY WITH  CONTRAST TECHNIQUE: Multidetector CT imaging of the right tibia and fibula was performed according to the standard protocol following intravenous contrast administration.  COMPARISON:  Right tibia and fibula x-rays dated May 30, 2019. CONTRAST:  77mL OMNIPAQUE IOHEXOL 300 MG/ML  SOLN FINDINGS: Bones/Joint/Cartilage No fracture or dislocation. No osseous destruction or periosteal reaction. Prior right total knee arthroplasty. Small knee joint effusion. Ligaments Ligaments are suboptimally evaluated by CT. Muscles and Tendons Grossly intact. No muscle atrophy. Soft tissue Mild circumferential skin thickening and subcutaneous edema of the mid to distal lower leg. Innumerable superficial phleboliths. Three-vessel atherosclerotic calcification. No fluid collection or hematoma. No soft tissue mass. IMPRESSION: 1. Mild circumferential skin thickening and subcutaneous edema of the mid to distal lower leg, nonspecific, but can be seen with cellulitis, venous insufficiency, or third spacing. No abscess or CT evidence of osteomyelitis. 2. Prior right total knee arthroplasty with small joint effusion. Electronically Signed   By: Titus Dubin M.D.   On: 06/04/2019 10:22    Assessment/Plan  #1 history of hypertension-she is on hydrochlorothiazide 12.5 mg a day-as noted above her blood pressures generally run quite well in the [id number] to [id number] systolically -I suspect today's reading was an abnormality by the machine-manual recheck was 138/70 which was significantly improved.  At this point will monitor with parameters to notify provider if systolic greater than [id number] or diastolic greater than 95  #2 history of leg pain with history of cellulitis she is complete antibiotics she is followed by wound care she does have tramadol 50 mg twice daily and this appears to be helping.  She is also on Neurontin 600 mg 3 times daily.  3.  History of atrial fibrillation this appears rate controlled she is not on a rate limiting agent she is on Eliquis 5 mg twice daily for anticoagulation.   #4 history of DVT and pulmonary embolism again continues on Eliquis.  5.  History of nosebleed-again  evaluation by ENT did not really show any clear source-there have been no further episodes-but she remains concerned -- will check her hemoglobin for stability.   [id number]

## 2019-06-25 NOTE — Patient Outreach (Signed)
[facility] Post Acute Care Coordinator follow up.  Mrs. Su is currently receiving skilled therapy at [facility].  Telephone call made to Iliani Vonwald (son) [phone] to confirm transition plans. However, no answer. Left HIPAA compliant voicemail message requesting return call.    Marthenia Rolling, MSN-Ed, RN,BSN [place] Acute Care Coordinator [phone] [facility]) [phone]  (Toll free office)

## 2019-06-26 ENCOUNTER — Encounter: Payer: Self-pay | Admitting: Internal Medicine

## 2019-06-26 ENCOUNTER — Encounter (HOSPITAL_BASED_OUTPATIENT_CLINIC_OR_DEPARTMENT_OTHER): Payer: Medicare Other | Attending: Internal Medicine | Admitting: Internal Medicine

## 2019-06-26 ENCOUNTER — Other Ambulatory Visit: Payer: Self-pay

## 2019-06-26 ENCOUNTER — Other Ambulatory Visit: Payer: Self-pay | Admitting: *Deleted

## 2019-06-26 DIAGNOSIS — I872 Venous insufficiency (chronic) (peripheral): Secondary | ICD-10-CM | POA: Diagnosis not present

## 2019-06-26 DIAGNOSIS — E785 Hyperlipidemia, unspecified: Secondary | ICD-10-CM | POA: Diagnosis not present

## 2019-06-26 DIAGNOSIS — G4730 Sleep apnea, unspecified: Secondary | ICD-10-CM | POA: Diagnosis not present

## 2019-06-26 DIAGNOSIS — I87311 Chronic venous hypertension (idiopathic) with ulcer of right lower extremity: Secondary | ICD-10-CM | POA: Diagnosis not present

## 2019-06-26 DIAGNOSIS — L97311 Non-pressure chronic ulcer of right ankle limited to breakdown of skin: Secondary | ICD-10-CM | POA: Diagnosis not present

## 2019-06-26 DIAGNOSIS — S91001A Unspecified open wound, right ankle, initial encounter: Secondary | ICD-10-CM | POA: Diagnosis not present

## 2019-06-26 DIAGNOSIS — I890 Lymphedema, not elsewhere classified: Secondary | ICD-10-CM | POA: Diagnosis not present

## 2019-06-26 LAB — BASIC METABOLIC PANEL
BUN: 13 (ref 4–21)
CO2: 23 — AB (ref 13–22)
Chloride: 104 (ref 99–108)
Creatinine: 0.5 (ref 0.5–1.1)
Glucose: 108
Potassium: 4.2 (ref 3.4–5.3)
Sodium: 139 (ref 137–147)

## 2019-06-26 LAB — CBC AND DIFFERENTIAL
HCT: 31 — AB (ref 36–46)
Hemoglobin: 10.1 — AB (ref 12.0–16.0)
Platelets: 198 (ref 150–399)
WBC: 5.6

## 2019-06-26 LAB — COMPREHENSIVE METABOLIC PANEL
Calcium: 9 (ref 8.7–10.7)
GFR calc Af Amer: >90
GFR calc non Af Amer: 84.71

## 2019-06-26 LAB — CBC: RBC: 3.64 — AB (ref 3.87–5.11)

## 2019-06-26 NOTE — Patient Outreach (Signed)
[facility] Post Acute Care Coordinator follow up.   Telephone call received from Colandra Hitchcock (son) at [phone].   [name] endorses member is slated for [employer] from [employer] on this Friday 06/29/19. He remains agreeable to both [facility] LCSW and [facility] follow up.   Encouraged [name] to consider paid caregiver assistance as well. Previously discussed the need for medical alert system and camera system. [name] states member will not wear medical alert. She has had one in the past.   Explained to [name] that [employer] will outreach to him for scheduling home visit.   Will plan to make [facility] LCSW referral upon SNF dc.   Communication sent to [facility] regarding member's upcoming dc on 06/29/19 from [facility].    Marthenia Rolling, MSN-Ed, RN,BSN [place] Acute Care Coordinator [phone] [facility]) [phone]  (Toll free office)

## 2019-06-26 NOTE — Patient Outreach (Addendum)
[place] Coordinator follow up.   Ms. Curreri remains in [facility] receiving skilled therapy.  Spoke with member's son [name] last week about [facility] and [facility] follow up. Telephone call made to [name] [phone] to confirm transition plans. No answer. HIPAA compliant voicemail message left again.   Communication sent to [employer] SW to verify transition plans.   Will continue to outreach and plan [facility] referrals as appropriate if member returns home.   Marthenia Rolling, MSN-Ed, RN,BSN [place] Acute Care Coordinator [phone] [facility]) [phone]  (Toll free office)

## 2019-06-27 ENCOUNTER — Encounter: Payer: Self-pay | Admitting: Internal Medicine

## 2019-06-27 ENCOUNTER — Non-Acute Institutional Stay (SKILLED_NURSING_FACILITY): Payer: Medicare Other | Admitting: Internal Medicine

## 2019-06-27 DIAGNOSIS — M79604 Pain in right leg: Secondary | ICD-10-CM | POA: Diagnosis not present

## 2019-06-27 DIAGNOSIS — L03115 Cellulitis of right lower limb: Secondary | ICD-10-CM

## 2019-06-27 DIAGNOSIS — I4821 Permanent atrial fibrillation: Secondary | ICD-10-CM

## 2019-06-27 DIAGNOSIS — I10 Essential (primary) hypertension: Secondary | ICD-10-CM

## 2019-06-27 NOTE — Progress Notes (Signed)
Samantha Bishop ([id number]) Visit Report for 06/26/2019 HPI Details Patient Name: Date of Service: Samantha Bishop, Samantha Bishop 06/26/2019 1:45 PM Medical Record [id number] Patient Account Number: [id number] Date of Birth/Sex: Treating RN: Sep 25, 1924 (86 y.o. [name] Primary Care Provider: Pricilla Holm Other Clinician: Referring Provider: Treating Provider/Extender:Mishika Flippen, Tenna Child, [place] Siren in Treatment: 2 History of Present Illness HPI Description: ADMISSION 06/12/2019 Very pleasant 86 year old woman who came to [place] from [facility] skilled facility. She is here for review of wounds on her right medial ankle. I think these started before she was admitted to hospital. She was seen by her primary physician early in January and felt to have cellulitis treated with outpatient regimen of antibiotics however this did not improve and she was admitted to hospital from 1/19 through 06/06/2019. She is felt to have cellulitis was initially given IV cefepime and vancomycin. X-ray showed no osteosoft tissue calcification. She had a CT scan of her tib-fib that showed mild circumferential skin thickening and edema of the mid to distal lower extremity which could be seen in cellulitis chronic venous insufficiency or third spacing. She has not had formal arterial studies but did have venous reflux studies in 2019 which I will attempt to review. She is now in [facility] skilled facility under lockdown isolation. I am not really clear what they are putting on this wound. She did not have any compression wraps but does admit she had them in the remote past. She is concerned about the very tight adherent skin she has in her right ankle. She has lesser degrees of change on the left. Past medical history includes sleep apnea, hypertension, chronic venous insufficiency for which she follows with Dr. Cresenciano Lick at [facility], chronic atrial fib on Eliquis, history of a CVA, right  total knee replacement remotely and a history of right leg fracture. ABIs in our clinic were 0.94 on the right 06/26/2019; 2-week follow-up. This is a patient who had a geographic wound pattern over the right medial malleolus and just below the right medial malleolus. These things are sometimes difficult to heal however they have been using alginate under 3 layer compression and indeed the wound is healed. This patient has severe venous insufficiency with severe stasis dermatitis and lymphedema. She has fibrosed skin in the bottom half of her lower leg which is uncomfortable. She tells me she cannot get standard compression stockings on. Complicating all of this she is going home from [place] on Friday and she lives on her own Electronic Signature(s) Signed: 06/26/2019 5:18:57 PM By: Linton Ham MD Entered By: Linton Ham on 06/26/2019 14:58:41 -------------------------------------------------------------------------------- Physical Exam Details Patient Name: Date of Service: Samantha Bishop 06/26/2019 1:45 PM Medical Record [id number] Patient Account Number: [id number] Date of Birth/Sex: Treating RN: 02-Apr-1925 (86 y.o. [name] Primary Care Provider: Pricilla Holm Other Clinician: Referring Provider: Treating Provider/Extender:Chinita Schimpf, Tenna Child, [place] Siren in Treatment: 2 Constitutional Patient is hypertensive.. Pulse regular and within target range for patient.Marland Kitchen Respirations regular, non-labored and within target range.. Temperature is normal and within the target range for the patient.Marland Kitchen Appears in no distress. Notes Wound exam; the small geographic pattern of small wounds and inflamed skin on the right medial lower ankle is closed. She has tightly fibrotic skin in the distal one third of her leg and lymphedema from the mid calf towards her knee [inverted bottle sign]. She is uncomfortable her skin is sensitive. She does not have as much  edema control as I would like to  see. Electronic Signature(s) Signed: 06/26/2019 5:18:57 PM By: Linton Ham MD Entered By: Linton Ham on 06/26/2019 14:55:14 -------------------------------------------------------------------------------- Physician Orders Details Patient Name: Date of Service: [name], [name] 06/26/2019 1:45 PM Medical Record [id number] Patient Account Number: [id number] Date of Birth/Sex: Treating RN: 10-05-1924 (86 y.o. [name] Primary Care Provider: Pricilla Holm Other Clinician: Referring Provider: Treating Provider/Extender:Hanzel Pizzo, Tenna Child, [place] Siren in Treatment: 2 Verbal / Phone Orders: No Diagnosis Coding ICD-10 Coding Code Description [id number] Non-pressure chronic ulcer of right ankle limited to breakdown of skin I87.331 Chronic venous hypertension (idiopathic) with ulcer and inflammation of right lower extremity I89.0 Lymphedema, not elsewhere classified Discharge From [facility] Discharge from [place] Frequency Other: - remove dressing thursday and apply juxatlite to bi lat lower extrimities, on in the am, off in the pm Skin Barriers/Peri-Wound Care Moisturizing lotion - wound center to apply only TCA Cream or Ointment - wound center to apply only Edema Control 3 Layer Compression System - Right Lower Extremity - [facility] staff to remove thursday and apply juxatalite , on in the am, off in the pm Electronic Signature(s) Signed: 06/26/2019 5:18:57 PM By: Linton Ham MD Signed: 06/27/2019 5:45:19 PM By: Carlene Coria RN Entered By: Carlene Coria on 06/26/2019 14:48:30 -------------------------------------------------------------------------------- Problem List Details Patient Name: Date of Service: Samantha Bishop. 06/26/2019 1:45 PM Medical Record [id number] Patient Account Number: [id number] Date of Birth/Sex: Treating RN: [birth year] (86 y.o. [name] Primary Care Provider: Pricilla Holm Other Clinician: Referring Provider: Treating Provider/Extender:Laneice Meneely, Tenna Child, [place] Siren in Treatment: 2 Active Problems ICD-10 Evaluated Encounter Code Description Active Date Today Diagnosis [id number] Non-pressure chronic ulcer of right ankle limited to 06/12/2019 No Yes breakdown of skin I87.331 Chronic venous hypertension (idiopathic) with ulcer 06/12/2019 No Yes and inflammation of right lower extremity I89.0 Lymphedema, not elsewhere classified 06/12/2019 No Yes Inactive Problems Resolved Problems Electronic Signature(s) Signed: 06/26/2019 5:18:57 PM By: Linton Ham MD Entered By: Linton Ham on 06/26/2019 14:52:36 -------------------------------------------------------------------------------- Progress Note Details Patient Name: Date of Service: Samantha Bishop 06/26/2019 1:45 PM Medical Record [id number] Patient Account Number: [id number] Date of Birth/Sex: Treating RN: December 06, 1924 (86 y.o. [name] Primary Care Provider: Pricilla Holm Other Clinician: Referring Provider: Treating Provider/Extender:Derrica Sieg, Tenna Child, [place] Siren in Treatment: 2 Subjective History of Present Illness (HPI) ADMISSION 06/12/2019 Very pleasant 86 year old woman who came to [place] from [facility] skilled facility. She is here for review of wounds on her right medial ankle. I think these started before she was admitted to hospital. She was seen by her primary physician early in January and felt to have cellulitis treated with outpatient regimen of antibiotics however this did not improve and she was admitted to hospital from 1/19 through 06/06/2019. She is felt to have cellulitis was initially given IV cefepime and vancomycin. X-ray showed no osteosoft tissue calcification. She had a CT scan of her tib-fib that showed mild circumferential skin thickening and edema of the mid to distal lower extremity  which could be seen in cellulitis chronic venous insufficiency or third spacing. She has not had formal arterial studies but did have venous reflux studies in 2019 which I will attempt to review. She is now in [facility] skilled facility under lockdown isolation. I am not really clear what they are putting on this wound. She did not have any compression wraps but does admit she had them in the remote past. She is concerned about the very tight adherent skin she has in  her right ankle. She has lesser degrees of change on the left. Past medical history includes sleep apnea, hypertension, chronic venous insufficiency for which she follows with Dr. Cresenciano Lick at [facility], chronic atrial fib on Eliquis, history of a CVA, right total knee replacement remotely and a history of right leg fracture. ABIs in our clinic were 0.94 on the right 06/26/2019; 2-week follow-up. This is a patient who had a geographic wound pattern over the right medial malleolus and just below the right medial malleolus. These things are sometimes difficult to heal however they have been using alginate under 3 layer compression and indeed the wound is healed. This patient has severe venous insufficiency with severe stasis dermatitis and lymphedema. She has fibrosed skin in the bottom half of her lower leg which is uncomfortable. She tells me she cannot get standard compression stockings on. Complicating all of this she is going home from [place] on Friday and she lives on her own Objective Constitutional Patient is hypertensive.. Pulse regular and within target range for patient.Marland Kitchen Respirations regular, non-labored and within target range.. Temperature is normal and within the target range for the patient.Marland Kitchen Appears in no distress. Vitals Time Taken: 1:35 PM, Height: 66 in, Weight: 178 lbs, BMI: 28.7, Temperature: 98.5 F, Pulse: 87 bpm, Respiratory Rate: 16 breaths/min, Blood Pressure: 184/88 mmHg. General Notes:  Wound exam; the small geographic pattern of small wounds and inflamed skin on the right medial lower ankle is closed. She has tightly fibrotic skin in the distal one third of her leg and lymphedema from the mid calf towards her knee [inverted bottle sign]. She is uncomfortable her skin is sensitive. She does not have as much edema control as I would like to see. Integumentary (Hair, Skin) Wound #1 status is Open. Original cause of wound was Gradually Appeared. The wound is located on the Right,Medial Malleolus. The wound measures 0cm length x 0cm width x 0cm depth; 0cm^2 area and 0cm^3 volume. There is no tunneling or undermining noted. There is a none present amount of drainage noted. The wound margin is indistinct and nonvisible. There is no granulation within the wound bed. There is no necrotic tissue within the wound bed. General Notes: stasis dermatitis to lower leg Assessment Active Problems ICD-10 Non-pressure chronic ulcer of right ankle limited to breakdown of skin Chronic venous hypertension (idiopathic) with ulcer and inflammation of right lower extremity Lymphedema, not elsewhere classified Plan Discharge From [facility] Services: Discharge from [place] Frequency: Other: - remove dressing thursday and apply juxatlite to bi lat lower extrimities, on in the am, off in the pm Skin Barriers/Peri-Wound Care: Moisturizing lotion - wound center to apply only TCA Cream or Ointment - wound center to apply only Edema Control: 3 Layer Compression System - Right Lower Extremity - [facility] staff to remove thursday and apply juxatalite , on in the am, off in the pm 1. TCA moisturizer 3 layer compression 2. There very little option about what we can do here. After discussing with the patient we elected to go ahead and order her bilateral external compression garments without this she is going to have recurrent skin breakdown in these areas 3. She has severe  chronic venous insufficiency right greater than left with severe chronic venous stasis. Her skin is adherent fibrosed and she has inverted bottle sign. If we cannot control the edema/lymphedema she will have recurrent problems with wounds in this area. 4. There did not seem to be an option  for an over the toe stocking we therefore went ahead with bilateral juxta lite stockings which are adjustable she will definitely need 30-40 compression Electronic Signature(s) Signed: 06/26/2019 5:18:57 PM By: Linton Ham MD Entered By: Linton Ham on 06/26/2019 14:59:54 -------------------------------------------------------------------------------- SuperBill Details Patient Name: Date of Service: Samantha Bishop 06/26/2019 Medical Record [id number] Patient Account Number: [id number] Date of Birth/Sex: Treating RN: 11/01/1924 (86 y.o. 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 of skin I87.331 Chronic venous hypertension (idiopathic) with ulcer and inflammation of right lower extremity I89.0 Lymphedema, not elsewhere classified Facility Procedures CPT4 Code Description: [id number] (Facility Use Only) [phone] - [place] RT LEG Modifier: Quantity: 1 Physician Procedures CPT4 Code Description: [id number] - WC PHYS LEVEL 3 - EST PT ICD-10 Diagnosis Description [id number] Non-pressure chronic ulcer of right ankle limited to break I87.331 Chronic venous hypertension (idiopathic) with ulcer and in lower extremity I89.0  Lymphedema, not elsewhere classified Modifier: down of skin flammation of ri Quantity: 1 ght Electronic Signature(s) Signed: 06/26/2019 5:18:57 PM By: Linton Ham MD Signed: 06/27/2019 5:45:19 PM By:  Carlene Coria RN Entered By: Carlene Coria on 06/26/2019 15:11:05

## 2019-06-27 NOTE — Progress Notes (Signed)
Location:    [facility] Room Number: 105/P Place of Service:  SNF [phone])  Provider: Lorne Skeens  PCP: Hoyt Koch, MD Patient Care Team: Hoyt Koch, MD as PCP - General (Internal Medicine)  Extended Emergency Contact Information Primary Emergency Contact: Dorise Bullion Address: [place]          [place] [address] [address] Phone: [phone] Relation: Son  Code Status: Full Code Goals of care:  Advanced Directive information Advanced Directives 06/27/2019  Does Patient Have a Medical Advance Directive? Yes  Type of Advance Directive (No Data)  Does patient want to make changes to medical advance directive? No - Patient declined  Would patient like information on creating a medical advance directive? -  Pre-existing out of facility DNR order (yellow form or pink MOST form) -     Allergies  Allergen Reactions  . Codeine     REACTION: causes nausea  . Penicillins     REACTION: rash    Chief Complaint  Patient presents with  . Discharge Note    Discharge Visit    HPI:  86 y.o. female seen today for discharge from facility this Friday, February 19.  Patient will be going home and she is very much looking forward to this.  She was here for rehab after hospitalization for right leg cellulitis  Her other diagnoses include atrial fibrillation on Eliquis as well as history of prior CVA hyperlipidemia hypertension history of DVT pulmonary embolism.  She was recently hospitalized for right lower extremity cellulitis and received IV vancomycin and cefepime I am and transition to oral Keflex and then doxycycline.  She has completed treatment and has been followed by wound care.  Recommendations have been made about applying Juxalite to bilateral lower extremities.  In wound care is following up on this and this will need follow-up by home health as well when she goes home.  She did  complain of increased leg pain recently and was started on tramadol 50 mg twice daily and this appears to be helping she also has orders for Neurontin.  In regards to atrial fibrillation she continues on Eliquis 5 mg twice daily she is also on this with a history of PE and DVT in the past.  She also had a nosebleed in the hospital and was evaluated by ENT who could not really find any etiology for this she did have an endoscopy.  There have been no further episodes here at the facility.   Her hemoglobin has been stable.  I did see her earlier this week for a machine reading with a systolic in the [id number] however on recheck after activity her systolic was [id number] blood pressure generally has been pretty stable here it is 144/80 today which is a bit elevated but at this point would be hesitant to change any medications before discharge.. Considering her risk of hypotension with advanced age-and fall risk   She is on hydrochlorothiazide 12.5 mg a day  Currently she is sitting in her chair comfortably-actually is eating her lunch-she does not really have any acute complaints.  She will need continued PT OT as well as home health to follow-up on her leg issues.    Past Medical History:  Diagnosis Date  . Anxiety state, unspecified   . Calculus of kidney   . Cerebral aneurysm, nonruptured   . Colon polyp   . Complication of anesthesia    "my heart stopped" 2004 knee  surg - see note on chart  . DEEP VENOUS THROMBOPHLEBITIS 08/15/2007   Qualifier: History of  By: Lenna Gilford MD, Deborra Medina   . Diverticulosis of colon (without mention of hemorrhage)   . History of skin cancer   . HTN (hypertension)   . Hyperlipidemia   . Lumbago   . Osteoarthrosis, unspecified whether generalized or localized, unspecified site   . Permanent atrial fibrillation ([place])   . Pulmonary emboli ([place])    following remote knee arthroscopy surgery  . Ruptured lumbar disc   . Sleep apnea    stop bang score 4   .  Unspecified hemorrhoids without mention of complication     Past Surgical History:  Procedure Laterality Date  . ABDOMINAL HYSTERECTOMY    . APPENDECTOMY    . BLOCKED INTESTINE SURGERY    . BREAST CYST EXCISION    . ESOPHAGOGASTRODUODENOSCOPY (EGD) WITH PROPOFOL N/A 05/30/2019   Procedure: ESOPHAGOGASTRODUODENOSCOPY (EGD) WITH PROPOFOL;  Surgeon: Thornton Park, MD;  Location: [facility];  Service: Gastroenterology;  Laterality: N/A;  . HEMHORROIDECTOMY    . JOINT REPLACEMENT  2004   RT TOTAL KNEE  . KNEE ARTHROSCOPY     X2 L KNEE  . KNEE SURGERY    . NASAL SURGERY X2    . TONSILLECTOMY AND ADENOIDECTOMY    . TOTAL KNEE ARTHROPLASTY  09/03/2011   Procedure: TOTAL KNEE ARTHROPLASTY;  Surgeon: Gearlean Alf, MD;  Location: [facility];  Service: Orthopedics;  Laterality: Left;      reports that she has never smoked. She has never used smokeless tobacco. She reports current alcohol use. She reports that she does not use drugs. Social History   Socioeconomic History  . Marital status: Single    Spouse name: Not on file  . Number of children: 3  . Years of education: Not on file  . Highest education level: Not on file  Occupational History  . Occupation: Retired  Tobacco Use  . Smoking status: Never Smoker  . Smokeless tobacco: Never Used  Substance and Sexual Activity  . Alcohol use: Yes    Alcohol/week: 0.0 standard drinks    Comment: Rarely  . Drug use: No  . Sexual activity: Not Currently  Other Topics Concern  . Not on file  Social History Narrative  . Not on file   Social Determinants of Health   Financial Resource Strain:   . Difficulty of Paying Living Expenses: Not on file  Food Insecurity:   . Worried About Charity fundraiser in the Last Year: Not on file  . Ran Out of Food in the Last Year: Not on file  Transportation Needs:   . Lack of Transportation (Medical): Not on file  . Lack of Transportation (Non-Medical): Not on file  Physical Activity:   .  Days of Exercise per Week: Not on file  . Minutes of Exercise per Session: Not on file  Stress:   . Feeling of Stress : Not on file  Social Connections:   . Frequency of Communication with Friends and Family: Not on file  . Frequency of Social Gatherings with Friends and Family: Not on file  . Attends Religious Services: Not on file  . Active Member of Clubs or Organizations: Not on file  . Attends Archivist Meetings: Not on file  . Marital Status: Not on file  Intimate Partner Violence:   . Fear of Current or Ex-Partner: Not on file  . Emotionally Abused: Not on file  .  Physically Abused: Not on file  . Sexually Abused: Not on file   Functional Status Survey:    Allergies  Allergen Reactions  . Codeine     REACTION: causes nausea  . Penicillins     REACTION: rash    Pertinent  Health Maintenance Due  Topic Date Due  . PNA vac Low Risk Adult (2 of 2 - PCV13) 02/03/2012  . INFLUENZA VACCINE  Completed  . DEXA SCAN  Completed    Medications: Allergies as of 06/27/2019      Reactions   Codeine    REACTION: causes nausea   Penicillins    REACTION: rash      Medication List       Accurate as of June 27, 2019 12:20 PM. If you have any questions, ask your nurse or doctor.        STOP taking these medications   NON FORMULARY Stopped by: Granville Lewis, PA-C     TAKE these medications   acetaminophen 325 MG tablet Commonly known as: TYLENOL Take 2 tablets (650 mg total) by mouth every 6 (six) hours as needed for mild pain, moderate pain or fever (or Fever >/= 101).   apixaban 5 MG Tabs tablet Commonly known as: Eliquis TAKE 1 TABLET (5 MG TOTAL) BY MOUTH 2 (TWO) TIMES DAILY.   cycloSPORINE 0.05 % ophthalmic emulsion Commonly known as: RESTASIS Place 1 drop into both eyes as needed (For Dry Eyes).   fluticasone 50 MCG/ACT nasal spray Commonly known as: FLONASE Place 2 sprays into both nostrils daily.   gabapentin 300 MG capsule Commonly known  as: NEURONTIN Take 600 mg by mouth 3 (three) times daily.   hydrochlorothiazide 25 MG tablet Commonly known as: HYDRODIURIL Take 0.5 tablets (12.5 mg total) by mouth daily.   NON FORMULARY Diet order: Regular Diet.   polyethylene glycol 17 g packet Commonly known as: MIRALAX / GLYCOLAX Take 17 g by mouth as needed for mild constipation.   pravastatin 20 MG tablet Commonly known as: PRAVACHOL TAKE 1 TABLET (20 MG TOTAL) BY MOUTH DAILY AT 6 PM.   sodium chloride 0.65 % Soln nasal spray Commonly known as: OCEAN Place 1 spray into both nostrils as needed for congestion.   traMADol 50 MG tablet Commonly known as: ULTRAM Take 1 tablet (50 mg total) by mouth 2 (two) times daily for 20 days.   Vasculera Tabs Take 630 mg by mouth daily.       Review of Systems   In general she is not complaining of any fever or chills.  Skin does not complain of rashes or itching has had a history of right lower extremity cellulitis has been followed by wound care.  Head ears eyes nose mouth and throat is not complain of visual changes or sore throat.-Has not had any nosebleeds during her stay here  Respiratory does not complain of being short of breath or having a cough.  Cardiac does not complain of any chest pain or increased edema from baseline.  GI is not complaining of abdominal pain nausea vomiting diarrhea constipation.  GU does not complain of dysuria.  Musculoskeletal was not really complaining of leg pain today-apparently tramadol has given her relief as well as Neurontin.  Neurologic does not complain of dizziness headache or at this point numbness.  Psych does not complain of being depressed or anxious.    Vitals:   06/27/19 1202  BP: 135/72  Pulse: 81  Resp: 18  Temp: (!) 97.3 F (36.3  C)  TempSrc: Oral  SpO2: 98%  Weight: 178 lb 12.8 oz (81.1 kg)  Height: 5\' 6"  (1.676 m)   Body mass index is 28.86 kg/m. Physical Exam  In general this is a pleasant elderly  female who looks younger than her stated age she is sitting comfortably in her chair.  Her skin is warm and dry she currently has hose over her lower extremities bilaterally per wound care this is stable without sign of any infection she actually has just seen wound care physician  Eyes visual acuity appears to be intact sclera and conjunctive are clear.  Oropharynx is clear mucous membranes moist.  Chest is clear to auscultation there is no labored breathing.  Heart is regular irregular rate and rhythm without murmur gallop or rub she has scant lower extremity edema.  Abdomen is soft nontender with positive bowel sounds.  Musculoskeletal is able to move all extremities x4-appears to be at baseline-as noted above she does have holes on her legs bilaterally.  Neurologic appears grossly intact her speech is clear could not appreciate lateralizing findings.  Psych she is pleasant and appropriate alert and oriented.    Labs reviewed:  June 26, 2019.  WBC 5.6 hemoglobin 10.1 platelets 108.  Sodium 139 potassium 4.2 BUN 12.8 creatinine 0.49.   Basic Metabolic Panel: Recent Labs    05/31/19 0323 06/01/19 0507 06/05/19 0307  NA 139 138 139  K 4.1 3.9 4.0  CL 106 106 104  CO2 23 25 26   GLUCOSE 138* 108* 109*  BUN 14 11 16   CREATININE 0.60 0.56 0.74  CALCIUM 9.1 8.8* 9.5   Liver Function Tests: Recent Labs    12/21/18 1332  AST 14  ALT 10  ALKPHOS 64  BILITOT 0.6  PROT 7.8  ALBUMIN 4.7   No results for input(s): LIPASE, AMYLASE in the last 8760 hours. No results for input(s): AMMONIA in the last 8760 hours. CBC: Recent Labs    05/29/19 2241 05/29/19 2241 05/30/19 0700 05/30/19 0700 05/31/19 0323 06/01/19 0507 06/04/19 0219 06/05/19 0307 06/06/19 0148  WBC 9.6  --  7.8  --  8.4  --   --   --   --   NEUTROABS 6.0  --  5.1  --   --   --   --   --   --   HGB 11.9*   < > 11.3*   < > 10.6*   < > 10.3* 10.6* 10.2*  HCT 37.5   < > 34.5*   < > 33.6*   < >  32.8* 33.0* 32.4*  MCV 90.1  --  88.7  --  88.2  --   --   --   --   PLT 240  --  218  --  211  --   --   --   --    < > = values in this interval not displayed.   Cardiac Enzymes: No results for input(s): CKTOTAL, CKMB, CKMBINDEX, TROPONINI in the last 8760 hours. BNP: Invalid input(s): POCBNP CBG: No results for input(s): GLUCAP in the last 8760 hours.  Procedures and Imaging Studies During Stay: DG Tibia/Fibula Right  Result Date: 05/30/2019 CLINICAL DATA:  Infection. Cellulitis. EXAM: RIGHT TIBIA AND FIBULA - 2 VIEW COMPARISON:  May 29, 2007 FINDINGS: The patient is status post total knee arthroplasty. There is nonspecific subcutaneous edema involving the right lower extremity. Multiple calcifications are noted in the subcutaneous soft tissues which may related to chronic venous insufficiency. There  is no radiographic evidence for osteomyelitis. There is no acute displaced fracture. There are no pockets of subcutaneous gas. IMPRESSION: 1. No radiographic evidence for osteomyelitis. 2. Nonspecific subcutaneous edema involving the right lower extremity. This can be seen in patients with cellulitis in the appropriate clinical setting. 3. Soft tissue calcifications, likely related to chronic venous insufficiency. Electronically Signed   By: Constance Holster M.D.   On: 05/30/2019 00:55   CT SOFT TISSUE NECK W CONTRAST  Result Date: 06/06/2019 CLINICAL DATA:  Tonsil/adenoid disorder. Additional history provided: Reported recurrent epistaxis of unclear source despite extensive evaluation by ENT in GI, rule out ENT source for bleeding. Additional history provided by technologist: Patient reports developing epistaxis 4 days ago with presentation to the ER, cauterization 2 days ago. EXAM: CT NECK WITH CONTRAST TECHNIQUE: Multidetector CT imaging of the neck was performed using the standard protocol following the bolus administration of intravenous contrast. CONTRAST:  35mL OMNIPAQUE IOHEXOL 350  MG/ML SOLN COMPARISON:  Head CT 05/11/2017. FINDINGS: Pharynx and larynx: Streak artifact from dental restoration limits evaluation of the oral cavity. Within this limitation, no appreciable mass, swelling or vascular lesion is identified within the oral cavity, sinonasal region, pharynx or larynx. Salivary glands: No inflammation, mass, or stone. Thyroid: Subcentimeter right thyroid lobe nodules not meeting consensus criteria for ultrasound follow-up. Lymph nodes: No pathologically enlarged cervical chain lymph nodes. Vascular: The major vascular structures of the neck appear patent. Calcified plaque within the carotid bifurcations and proximal internal carotid arteries bilaterally. Partially retropharyngeal course of the bilateral ICAs. Limited intracranial: Redemonstrated right parietal, left occipital and left cerebellar encephalomalacia. Calcified plaque within the bilateral carotid artery siphons. Visualized orbits: Bilateral lens replacements. No acute abnormality at the visualized levels. Mastoids and visualized paranasal sinuses: Moderate-sized left maxillary sinus mucous retention cyst. There is otherwise no more than trace scattered paranasal sinus mucosal thickening at the imaged levels. Skeleton: No acute bony abnormality. Cervical spondylosis greatest at C6-C7 where there is advanced disc height loss with endplate spurring and a shallow posterior disc osteophyte. Trace C3-C4 anterolisthesis. Upper chest: No consolidation within the imaged lung apices. Atherosclerotic plaque within the visualized aortic arch and proximal major branch vessels of the neck. IMPRESSION: No specific cause of recurrent epistaxis is identified. No soft tissue neck mass or cervical lymphadenopathy is identified. Moderate-sized left maxillary sinus mucous retention cyst. Atherosclerotic disease. This includes moderate calcified plaque within the carotid bifurcations and proximal internal carotid arteries bilaterally.  Electronically Signed   By: Kellie Simmering DO   On: 06/06/2019 11:36   CT TIBIA FIBULA RIGHT W CONTRAST  Result Date: 06/04/2019 CLINICAL DATA:  Right lower leg cellulitis. EXAM: CT OF THE LOWER RIGHT EXTREMITY WITH CONTRAST TECHNIQUE: Multidetector CT imaging of the right tibia and fibula was performed according to the standard protocol following intravenous contrast administration. COMPARISON:  Right tibia and fibula x-rays dated May 30, 2019. CONTRAST:  75mL OMNIPAQUE IOHEXOL 300 MG/ML  SOLN FINDINGS: Bones/Joint/Cartilage No fracture or dislocation. No osseous destruction or periosteal reaction. Prior right total knee arthroplasty. Small knee joint effusion. Ligaments Ligaments are suboptimally evaluated by CT. Muscles and Tendons Grossly intact. No muscle atrophy. Soft tissue Mild circumferential skin thickening and subcutaneous edema of the mid to distal lower leg. Innumerable superficial phleboliths. Three-vessel atherosclerotic calcification. No fluid collection or hematoma. No soft tissue mass. IMPRESSION: 1. Mild circumferential skin thickening and subcutaneous edema of the mid to distal lower leg, nonspecific, but can be seen with cellulitis, venous insufficiency, or third spacing. No  abscess or CT evidence of osteomyelitis. 2. Prior right total knee arthroplasty with small joint effusion. Electronically Signed   By: Titus Dubin M.D.   On: 06/04/2019 10:22    Assessment/Plan:    #1 history of right leg cellulitis-she has completed antibiotic  and has just been seen by the wound care physician- have made recommendations as noted above for Juxalite to lower extremities bilaterally.  She will need follow-up by home health for wound care evaluation--as well as PT and OT  #2 history of atrial fibrillation this appears rate controlled she is not on a rate limiting agent she is on Eliquis 5 mg twice daily for anticoagulation.  3.  History of PE and DVT again she continues on Eliquis.  4.   History of leg pain she has been started on tramadol 50 mg twice daily which appears to be well-tolerated and giving her relief she also has orders for Neurontin 600 mg 3 times daily. We will write for a limited supply of tramadol with primary care provider follow-up   5.  Hypertension she is on hydrochlorothiazide [id number] mg a day-systolics lately appear to be more in the 120-130s range it is 144 today-as noted above she had a machine reading earlier this week in the 180s but this appears to be an anomaly.  At this point we will defer to primary care provider for any medication adjustments  #6 history of nosebleed again this was an issue in the hospital ENT did evaluate her in the hospital and no clear source was seen-there has been no reoccurrence She apparently did require catheterization in the hospital Hemoglobin shows stability-platelet count is somewhat  low at 108,000 this will warrant follow-up by primary care provider  #7 history of hyperlipidemia not stated as uncontrolled she is on pravastatin 20 mg a day  [id number]-of note greater than 30 minutes spent on this discharge summary-greater than 50% of time spent coordinating a plan of care for numerous diagnoses

## 2019-06-28 ENCOUNTER — Other Ambulatory Visit: Payer: Self-pay | Admitting: *Deleted

## 2019-06-28 ENCOUNTER — Encounter: Payer: Self-pay | Admitting: Internal Medicine

## 2019-06-28 NOTE — Patient Outreach (Signed)
[facility] Post Acute Care Coordinator follow up.  Telephone call received from Bernardo Heater (son) [phone]. [name] reports Mrs. Cornacchia will dc home tomorrow 06/29/19 with [facility].   Mr. Moo states member will transition to home on Friday, 2/19. States Meals on Wheels will resume Tuesday 07/03/19. [place] social work referral will not needed at this time for mobile meals. Encouraged Mr. Huntress again to consider private paid caregivers and medical alert system. Mr. Wardwell states he assists his mother as needed.   Mr. Valvo endorses [facility] has scheduled visit for Saturday, 06/30/19. He had questions regarding how long visit will last. Provided Mr. Flam with [facility] telephone number for further questions.   Confirmed with [facility] of member's scheduled visit.   Mr. Ince encouraged to Secondary school teacher if he should have any further concerns and questions.   Marthenia Rolling, MSN-Ed, RN,BSN [facility] Acute Care Coordinator [phone] [facility]) [phone]  (Toll free office)

## 2019-06-29 MED ORDER — APIXABAN 5 MG PO TABS: ORAL_TABLET | ORAL | 0 refills | Status: DC

## 2019-06-29 MED ORDER — GABAPENTIN 300 MG PO CAPS: 600.0000 mg | ORAL_CAPSULE | Freq: Three times a day (TID) | ORAL | 0 refills | Status: DC

## 2019-06-29 MED ORDER — VASCULERA PO TABS: 630.0000 mg | ORAL_TABLET | Freq: Every day | ORAL | 0 refills | Status: DC

## 2019-06-29 MED ORDER — FLUTICASONE PROPIONATE 50 MCG/ACT NA SUSP: 2.0000 | Freq: Every day | NASAL | 0 refills | Status: DC

## 2019-06-29 MED ORDER — TRAMADOL HCL 50 MG PO TABS: 50.0000 mg | ORAL_TABLET | Freq: Two times a day (BID) | ORAL | 0 refills | Status: DC

## 2019-06-29 MED ORDER — SALINE SPRAY 0.65 % NA SOLN: 1.0000 | NASAL | 0 refills | Status: DC | PRN

## 2019-06-29 MED ORDER — CYCLOSPORINE 0.05 % OP EMUL: 1.0000 [drp] | OPHTHALMIC | 0 refills | Status: DC | PRN

## 2019-06-29 MED ORDER — PRAVASTATIN SODIUM 20 MG PO TABS: 20.0000 mg | ORAL_TABLET | Freq: Every day | ORAL | 0 refills | Status: DC

## 2019-06-29 MED ORDER — HYDROCHLOROTHIAZIDE 25 MG PO TABS: 12.5000 mg | ORAL_TABLET | Freq: Every day | ORAL | 0 refills | Status: DC

## 2019-06-30 DIAGNOSIS — I69398 Other sequelae of cerebral infarction: Secondary | ICD-10-CM | POA: Diagnosis not present

## 2019-06-30 DIAGNOSIS — G4730 Sleep apnea, unspecified: Secondary | ICD-10-CM | POA: Diagnosis not present

## 2019-06-30 DIAGNOSIS — K219 Gastro-esophageal reflux disease without esophagitis: Secondary | ICD-10-CM | POA: Diagnosis not present

## 2019-06-30 DIAGNOSIS — N200 Calculus of kidney: Secondary | ICD-10-CM | POA: Diagnosis not present

## 2019-06-30 DIAGNOSIS — E785 Hyperlipidemia, unspecified: Secondary | ICD-10-CM | POA: Diagnosis not present

## 2019-06-30 DIAGNOSIS — F4323 Adjustment disorder with mixed anxiety and depressed mood: Secondary | ICD-10-CM | POA: Diagnosis not present

## 2019-06-30 DIAGNOSIS — I671 Cerebral aneurysm, nonruptured: Secondary | ICD-10-CM | POA: Diagnosis not present

## 2019-06-30 DIAGNOSIS — Z7901 Long term (current) use of anticoagulants: Secondary | ICD-10-CM | POA: Diagnosis not present

## 2019-06-30 DIAGNOSIS — R2689 Other abnormalities of gait and mobility: Secondary | ICD-10-CM | POA: Diagnosis not present

## 2019-06-30 DIAGNOSIS — M6281 Muscle weakness (generalized): Secondary | ICD-10-CM | POA: Diagnosis not present

## 2019-06-30 DIAGNOSIS — I872 Venous insufficiency (chronic) (peripheral): Secondary | ICD-10-CM | POA: Diagnosis not present

## 2019-06-30 DIAGNOSIS — M1990 Unspecified osteoarthritis, unspecified site: Secondary | ICD-10-CM | POA: Diagnosis not present

## 2019-06-30 DIAGNOSIS — M8580 Other specified disorders of bone density and structure, unspecified site: Secondary | ICD-10-CM | POA: Diagnosis not present

## 2019-06-30 DIAGNOSIS — I4821 Permanent atrial fibrillation: Secondary | ICD-10-CM | POA: Diagnosis not present

## 2019-06-30 DIAGNOSIS — Z7951 Long term (current) use of inhaled steroids: Secondary | ICD-10-CM | POA: Diagnosis not present

## 2019-06-30 DIAGNOSIS — Z96653 Presence of artificial knee joint, bilateral: Secondary | ICD-10-CM | POA: Diagnosis not present

## 2019-06-30 DIAGNOSIS — Z86711 Personal history of pulmonary embolism: Secondary | ICD-10-CM | POA: Diagnosis not present

## 2019-06-30 DIAGNOSIS — Z85828 Personal history of other malignant neoplasm of skin: Secondary | ICD-10-CM | POA: Diagnosis not present

## 2019-06-30 DIAGNOSIS — M5481 Occipital neuralgia: Secondary | ICD-10-CM | POA: Diagnosis not present

## 2019-06-30 DIAGNOSIS — K5790 Diverticulosis of intestine, part unspecified, without perforation or abscess without bleeding: Secondary | ICD-10-CM | POA: Diagnosis not present

## 2019-06-30 DIAGNOSIS — I890 Lymphedema, not elsewhere classified: Secondary | ICD-10-CM | POA: Diagnosis not present

## 2019-06-30 DIAGNOSIS — R32 Unspecified urinary incontinence: Secondary | ICD-10-CM | POA: Diagnosis not present

## 2019-06-30 DIAGNOSIS — I10 Essential (primary) hypertension: Secondary | ICD-10-CM | POA: Diagnosis not present

## 2019-06-30 DIAGNOSIS — Z86718 Personal history of other venous thrombosis and embolism: Secondary | ICD-10-CM | POA: Diagnosis not present

## 2019-06-30 DIAGNOSIS — D649 Anemia, unspecified: Secondary | ICD-10-CM | POA: Diagnosis not present

## 2019-07-01 DIAGNOSIS — I10 Essential (primary) hypertension: Secondary | ICD-10-CM | POA: Diagnosis not present

## 2019-07-01 DIAGNOSIS — M6281 Muscle weakness (generalized): Secondary | ICD-10-CM | POA: Diagnosis not present

## 2019-07-01 DIAGNOSIS — I69398 Other sequelae of cerebral infarction: Secondary | ICD-10-CM | POA: Diagnosis not present

## 2019-07-01 DIAGNOSIS — I890 Lymphedema, not elsewhere classified: Secondary | ICD-10-CM | POA: Diagnosis not present

## 2019-07-01 DIAGNOSIS — I4821 Permanent atrial fibrillation: Secondary | ICD-10-CM | POA: Diagnosis not present

## 2019-07-01 DIAGNOSIS — R2689 Other abnormalities of gait and mobility: Secondary | ICD-10-CM | POA: Diagnosis not present

## 2019-07-02 DIAGNOSIS — R040 Epistaxis: Secondary | ICD-10-CM | POA: Diagnosis not present

## 2019-07-03 DIAGNOSIS — R2689 Other abnormalities of gait and mobility: Secondary | ICD-10-CM | POA: Diagnosis not present

## 2019-07-03 DIAGNOSIS — I10 Essential (primary) hypertension: Secondary | ICD-10-CM | POA: Diagnosis not present

## 2019-07-03 DIAGNOSIS — I4821 Permanent atrial fibrillation: Secondary | ICD-10-CM | POA: Diagnosis not present

## 2019-07-03 DIAGNOSIS — I890 Lymphedema, not elsewhere classified: Secondary | ICD-10-CM | POA: Diagnosis not present

## 2019-07-03 DIAGNOSIS — I69398 Other sequelae of cerebral infarction: Secondary | ICD-10-CM | POA: Diagnosis not present

## 2019-07-03 DIAGNOSIS — M6281 Muscle weakness (generalized): Secondary | ICD-10-CM | POA: Diagnosis not present

## 2019-07-04 DIAGNOSIS — I4821 Permanent atrial fibrillation: Secondary | ICD-10-CM | POA: Diagnosis not present

## 2019-07-04 DIAGNOSIS — I69398 Other sequelae of cerebral infarction: Secondary | ICD-10-CM | POA: Diagnosis not present

## 2019-07-04 DIAGNOSIS — R2689 Other abnormalities of gait and mobility: Secondary | ICD-10-CM | POA: Diagnosis not present

## 2019-07-04 DIAGNOSIS — M6281 Muscle weakness (generalized): Secondary | ICD-10-CM | POA: Diagnosis not present

## 2019-07-04 DIAGNOSIS — I890 Lymphedema, not elsewhere classified: Secondary | ICD-10-CM | POA: Diagnosis not present

## 2019-07-04 DIAGNOSIS — I10 Essential (primary) hypertension: Secondary | ICD-10-CM | POA: Diagnosis not present

## 2019-07-05 DIAGNOSIS — Z23 Encounter for immunization: Secondary | ICD-10-CM | POA: Diagnosis not present

## 2019-07-05 NOTE — Progress Notes (Signed)
JANASHIA, HONEY ([id number]) Visit Report for 06/12/2019 Abuse/Suicide Risk Screen Details Patient Name: Date of Service: LILLYNN, LUPO 06/12/2019 1:15 PM Medical Record [id number] Patient Account Number: [id number] Date of Birth/Sex: Treating RN: [birth year] (86 y.o. [name] Primary Care Aidenn Skellenger: Pricilla Holm Other Clinician: Referring Rosebud Koenen: Treating Yordan Martindale/Extender:Robson, Tenna Child, [place] Si[place] in Treatment: 0 Abuse/Suicide Risk Screen Items Answer ABUSE RISK SCREEN: Has anyone close to you tried to hurt or harm you recentlyo No Do you feel uncomfortable with anyone in your familyo No Has anyone forced you do things that you didnt want to doo No Electronic Signature(s) Signed: 07/05/2019 2:25:48 PM By: Levan Hurst RN, BSN Entered By: Levan Hurst on 06/12/2019 14:20:41 -------------------------------------------------------------------------------- Activities of Daily Living Details Patient Name: Date of Service: CAMARON, DEBERNARDI 06/12/2019 1:15 PM Medical Record [id number] Patient Account Number: [id number] Date of Birth/Sex: Treating RN: Dec 28, 1924 (86 y.o. [name] Primary Care Mackie Goon: Pricilla Holm Other Clinician: Referring Leanna Hamid: Treating Lorren Splawn/Extender:Robson, Tenna Child, [place] Si[place] in Treatment: 0 Activities of Daily Living Items Answer Activities of Daily Living (Please select one for each item) Drive Automobile Need Assistance Take Medications Completely Able Use Telephone Completely Able Care for Appearance Completely Able Use Toilet Completely Able Bath / Shower Completely Able Dress Self Completely Able Feed Self Completely Able Walk Need Assistance Get In / Out Bed Completely Able Housework Need Assistance Prepare Meals Need Assistance Handle Money Completely Able Shop for Self Need Assistance Electronic Signature(s) Signed: 07/05/2019 2:25:48 PM By: Levan Hurst  RN, BSN Entered By: Levan Hurst on 06/12/2019 14:21:16 -------------------------------------------------------------------------------- Education Screening Details Patient Name: Date of Service: MARLAYAH, KAUFMANN 06/12/2019 1:15 PM Medical Record [id number] Patient Account Number: [id number] Date of Birth/Sex: Treating RN: [birth year] (86 y.o. [name] Primary Care Tris Howell: Pricilla Holm Other Clinician: Referring Karem Tomaso: Treating Kynzi Levay/Extender:Robson, Tenna Child, [place] Si[place] in Treatment: 0 Primary Learner Assessed: Patient Learning Preferences/Education Level/Primary Language Learning Preference: Explanation, Demonstration, Printed Material Highest Education Level: High School Preferred Language: English Cognitive Barrier Language Barrier: No Translator Needed: No Memory Deficit: No Emotional Barrier: No Cultural/Religious Beliefs Affecting Medical Care: No Physical Barrier Impaired Vision: No Impaired Hearing: No Decreased Hand dexterity: No Knowledge/Comprehension Knowledge Level: High Comprehension Level: High Ability to understand written High instructions: Ability to understand verbal High instructions: Motivation Anxiety Level: Calm Cooperation: Cooperative Education Importance: Acknowledges Need Interest in Health Problems: Asks Questions Perception: Coherent Willingness to Engage in Self- High Management Activities: Readiness to Engage in Self- High Management Activities: Electronic Signature(s) Signed: 07/05/2019 2:25:48 PM By: Levan Hurst RN, BSN Entered By: Levan Hurst on 06/12/2019 14:21:45 -------------------------------------------------------------------------------- Fall Risk Assessment Details Patient Name: Date of Service: VEE, SOLAN 06/12/2019 1:15 PM Medical Record [id number] Patient Account Number: [id number] Date of Birth/Sex: Treating RN: 12/15/1924 (86 y.o. [name] Primary Care Anyla Israelson: Pricilla Holm Other Clinician: Referring Leya Paige: Treating Charlissa Petros/Extender:Robson, Tenna Child, [place] Si[place] in Treatment: 0 Fall Risk Assessment Items Have you had 2 or more falls in the last 12 monthso 0 Yes Have you had any fall that resulted in injury in the last 12 monthso 0 No FALLS RISK SCREEN History of falling - immediate or within 3 months 0 No Secondary diagnosis (Do you have 2 or more medical diagnoseso) 15 Yes Ambulatory aid None/bed rest/wheelchair/nurse 0 No Crutches/cane/walker 15 Yes Furniture 0 No Intravenous therapy Access/Saline/Heparin Lock 0 No Weak (short steps with or without shuffle, stooped but able to lift head 0 No while walking, may seek support from furniture) Impaired (short  steps with shuffle, may have difficulty arising from chair, 0 No head down, impaired balance) Mental Status Oriented to own ability 0 Yes Overestimates or forgets limitations 0 No Risk Level: Medium Risk Score: 30 Electronic Signature(s) Signed: 07/05/2019 2:25:48 PM By: Levan Hurst RN, BSN Entered By: Levan Hurst on 06/12/2019 14:22:29 -------------------------------------------------------------------------------- Foot Assessment Details Patient Name: Date of Service: ANELLA, MELLAS 06/12/2019 1:15 PM Medical Record [id number] Patient Account Number: [id number] Date of Birth/Sex: Treating RN: [birth year] (86 y.o. [name] Primary Care Caitlyn Buchanan: Pricilla Holm Other Clinician: Referring Chatham Howington: Treating Perel Hauschild/Extender:Robson, Tenna Child, [place] Si[place] in Treatment: 0 Foot Assessment Items Site Locations + = Sensation present, - = Sensation absent, C = Callus, U = Ulcer R = Redness, W = Warmth, M = Maceration, PU = Pre-ulcerative lesion F = Fissure, S = Swelling, D = Dryness Assessment Right: Left: Other Deformity: No No Prior Foot Ulcer: No No Prior Amputation: No No Charcot  Joint: No No Ambulatory Status: Ambulatory Without Help Gait: Steady Electronic Signature(s) Signed: 07/05/2019 2:25:48 PM By: Levan Hurst RN, BSN Entered By: Levan Hurst on 06/12/2019 14:24:50 -------------------------------------------------------------------------------- Nutrition Risk Screening Details Patient Name: Date of Service: PRAGYA, BIRKES 06/12/2019 1:15 PM Medical Record [id number] Patient Account Number: [id number] Date of Birth/Sex: Treating RN: 1924-06-14 (86 y.o. [name] Primary Care Tyresa Prindiville: Pricilla Holm Other Clinician: Referring Safira Proffit: Treating Aiva Miskell/Extender:Robson, Tenna Child, [place] Si[place] in Treatment: 0 Height (in): 66 Weight (lbs): 178 Body Mass Index (BMI): 28.7 Nutrition Risk Screening Items Score Screening NUTRITION RISK SCREEN: I have an illness or condition that made me change the kind and/or 0 No amount of food I eat I eat fewer than two meals per day 0 No I eat few fruits and vegetables, or milk products 0 No I have three or more drinks of beer, liquor or wine almost every day 0 No I have tooth or mouth problems that make it hard for me to eat 0 No I don't always have enough money to buy the food I need 0 No I eat alone most of the time 0 No I take three or more different prescribed or over-the-counter drugs a day 1 Yes 0 No Without wanting to, I have lost or gained 10 pounds in the last six months I am not always physically able to shop, cook and/or feed myself 2 Yes Nutrition Protocols Good Risk Protocol Provide education on Moderate Risk Protocol 0 nutrition High Risk Proctocol Risk Level: Moderate Risk Score: 3 Electronic Signature(s) Signed: 07/05/2019 2:25:48 PM By: Levan Hurst RN, BSN Entered By: Levan Hurst on 06/12/2019 14:22:42

## 2019-07-05 NOTE — Progress Notes (Signed)
Samantha Bishop, Samantha Bishop ([id number]) Visit Report for 06/12/2019 Chief Complaint Document Details Patient Name: Date of Service: Samantha Bishop, Samantha Bishop 06/12/2019 1:15 PM Medical Record [id number] Patient Account Number: [id number] Date of Birth/Sex: Treating RN: Mar 18, 1925 (86 y.o. [name] Primary Care Provider: Pricilla Holm Other Clinician: Referring Provider: Treating Provider/Extender:Keyondra Lagrand, Tenna Child, [place] Siren in Treatment: 0 Information Obtained from: Patient Chief Complaint 06/12/2019; patient is here for review of wounds on her right medial lower leg Electronic Signature(s) Signed: 06/12/2019 5:22:39 PM By: Linton Ham MD Entered By: Linton Ham on 06/12/2019 15:03:35 -------------------------------------------------------------------------------- HPI Details Patient Name: Date of Service: Samantha Bishop 06/12/2019 1:15 PM Medical Record [id number] Patient Account Number: [id number] Date of Birth/Sex: Treating RN: 1925-02-04 (86 y.o. [name] Primary Care Provider: Pricilla Holm Other Clinician: Referring Provider: Treating Provider/Extender:Mykala Mccready, Tenna Child, [place] Siren in Treatment: 0 History of Present Illness HPI Description: ADMISSION 06/12/2019 Very pleasant 86 year old woman who came to [place] from [facility] skilled facility. She is here for review of wounds on her right medial ankle. I think these started before she was admitted to hospital. She was seen by her primary physician early in January and felt to have cellulitis treated with outpatient regimen of antibiotics however this did not improve and she was admitted to hospital from 1/19 through 06/06/2019. She is felt to have cellulitis was initially given IV cefepime and vancomycin. X-ray showed no osteosoft tissue calcification. She had a CT scan of her tib-fib that showed mild circumferential skin thickening and edema of the mid to distal lower extremity  which could be seen in cellulitis chronic venous insufficiency or third spacing. She has not had formal arterial studies but did have venous reflux studies in 2019 which I will attempt to review. She is now in [facility] skilled facility under lockdown isolation. I am not really clear what they are putting on this wound. She did not have any compression wraps but does admit she had them in the remote past. She is concerned about the very tight adherent skin she has in her right ankle. She has lesser degrees of change on the left. Past medical history includes sleep apnea, hypertension, chronic venous insufficiency for which she follows with Dr. Cresenciano Lick at [facility], chronic atrial fib on Eliquis, history of a CVA, right total knee replacement remotely and a history of right leg fracture. ABIs in our clinic were 0.94 on the right Electronic Signature(s) Signed: 06/12/2019 5:22:39 PM By: Linton Ham MD Entered By: Linton Ham on 06/12/2019 15:06:18 -------------------------------------------------------------------------------- Physical Exam Details Patient Name: Date of Service: Samantha Bishop, Samantha Bishop 06/12/2019 1:15 PM Medical Record [id number] Patient Account Number: [id number] Date of Birth/Sex: Treating RN: 05-18-1924 (86 y.o. [name] Primary Care Provider: Pricilla Holm Other Clinician: Referring Provider: Treating Provider/Extender:Jasminne Mealy, Tenna Child, [place] Siren in Treatment: 0 Constitutional Patient is hypertensive.. Pulse regular and within target range for patient.Marland Kitchen Respirations regular, non-labored and within target range.. Temperature is normal and within the target range for the patient.Marland Kitchen Appears in no distress. Respiratory work of breathing is normal. Cardiovascular Atrial fibrillation. No murmurs. Pulses in the right leg were easily palpable. Integumentary (Hair, Skin) Changes of chronic venous insufficiency in the right  greater than left leg. On the right medial leg there is marked stasis dermatitis. Very tightly adherent fibrotic skin. In the upper part of the leg some degree of lymphedema bilaterally. Psychiatric appears at normal baseline. Notes Wound exam; she has an all to common geographic pattern of small wounds and  inflamed skin on the right medial lower ankle. She has lymphedema from the mid calf towards the knee. [Inverted bottle sign] I think most of the pain she has is related to this condition. Unless we can control the swelling and the venous pressure in this area is likely that she will have further breakdown in the medial ankle and even is beginning to have further breakdown in the lateral part of the right ankle. I do not believe any of this is currently cellulitis Electronic Signature(s) Signed: 06/12/2019 5:22:39 PM By: Linton Ham MD Entered By: Linton Ham on 06/12/2019 15:10:37 -------------------------------------------------------------------------------- Physician Orders Details Patient Name: Date of Service: Samantha Bishop, Samantha Bishop 06/12/2019 1:15 PM Medical Record [id number] Patient Account Number: [id number] Date of Birth/Sex: Treating RN: 1924/10/11 (86 y.o. [name] Primary Care Provider: Pricilla Holm Other Clinician: Referring Provider: Treating Provider/Extender:Mayjor Ager, Tenna Child, [place] Siren in Treatment: 0 Verbal / Phone Orders: No Diagnosis Coding Follow-up Appointments Return Appointment in 2 weeks. Dressing Change Frequency Change Dressing every other day. - by SNF staff as ordered until seen at wound center in 2 weeks Skin Barriers/Peri-Wound Care Barrier cream TCA Cream or Ointment Wound Cleansing Wound #1 Right,Medial Malleolus Clean wound with Wound Cleanser May shower and wash wound with soap and water. Primary Wound Dressing Wound #1 Right,Medial Malleolus Calcium Alginate with Silver Secondary Dressing Wound #1  Right,Medial Malleolus Dry Gauze Edema Control Wound #1 Right,Medial Malleolus 3 Layer Compression System - Right Lower Extremity Electronic Signature(s) Signed: 06/12/2019 5:22:39 PM By: Linton Ham MD Signed: 06/12/2019 5:23:18 PM By: Carlene Coria RN Entered By: Carlene Coria on 06/12/2019 14:51:41 -------------------------------------------------------------------------------- Problem List Details Patient Name: Date of Service: Samantha Bishop, Samantha Bishop 06/12/2019 1:15 PM Medical Record [id number] Patient Account Number: [id number] Date of Birth/Sex: Treating RN: 1925/03/30 (86 y.o. [name] Primary Care Provider: Pricilla Holm Other Clinician: Referring Provider: Treating Provider/Extender:Shaka Zech, Tenna Child, [place] Siren in Treatment: 0 Active Problems ICD-10 Evaluated Encounter Code Description Active Date Today Diagnosis [id number] Non-pressure chronic ulcer of right ankle limited to 06/12/2019 No Yes breakdown of skin I87.331 Chronic venous hypertension (idiopathic) with ulcer 06/12/2019 No Yes and inflammation of right lower extremity I89.0 Lymphedema, not elsewhere classified 06/12/2019 No Yes Inactive Problems Resolved Problems Electronic Signature(s) Signed: 06/12/2019 5:22:39 PM By: Linton Ham MD Entered By: Linton Ham on 06/12/2019 15:03:04 -------------------------------------------------------------------------------- Progress Note Details Patient Name: Date of Service: Samantha Bishop 06/12/2019 1:15 PM Medical Record [id number] Patient Account Number: [id number] Date of Birth/Sex: Treating RN: [birth year] (86 y.o. [name] Primary Care Provider: Pricilla Holm Other Clinician: Referring Provider: Treating Provider/Extender:Whalen Trompeter, Tenna Child, [place] Siren in Treatment: 0 Subjective Chief Complaint Information obtained from Patient 06/12/2019; patient is here for review of wounds on her right medial lower  leg History of Present Illness (HPI) ADMISSION 06/12/2019 Very pleasant 86 year old woman who came to [place] from [facility] skilled facility. She is here for review of wounds on her right medial ankle. I think these started before she was admitted to hospital. She was seen by her primary physician early in January and felt to have cellulitis treated with outpatient regimen of antibiotics however this did not improve and she was admitted to hospital from 1/19 through 06/06/2019. She is felt to have cellulitis was initially given IV cefepime and vancomycin. X-ray showed no osteosoft tissue calcification. She had a CT scan of her tib-fib that showed mild circumferential skin thickening and edema of the mid to distal lower extremity which could be seen in cellulitis chronic venous  insufficiency or third spacing. She has not had formal arterial studies but did have venous reflux studies in 2019 which I will attempt to review. She is now in [facility] skilled facility under lockdown isolation. I am not really clear what they are putting on this wound. She did not have any compression wraps but does admit she had them in the remote past. She is concerned about the very tight adherent skin she has in her right ankle. She has lesser degrees of change on the left. Past medical history includes sleep apnea, hypertension, chronic venous insufficiency for which she follows with Dr. Cresenciano Lick at [facility], chronic atrial fib on Eliquis, history of a CVA, right total knee replacement remotely and a history of right leg fracture. ABIs in our clinic were 0.94 on the right Patient History Information obtained from Patient. Allergies codeine (Severity: Moderate, Reaction: nausea), penicillin (Severity: Moderate, Reaction: rash) Family History Cancer - Child, Heart Disease - Mother, Stroke - Father. Social History Never smoker, Marital Status - Widowed, Alcohol Use - Never, Drug Use - No History,  Caffeine Use - Rarely. Medical History Respiratory Patient has history of Sleep Apnea Cardiovascular Patient has history of Arrhythmia - A-Fib, Deep Vein Thrombosis - left leg, Hypertension, Peripheral Venous Disease Musculoskeletal Patient has history of Osteoarthritis Medical And Surgical History Notes Ear/Nose/Mouth/Throat recent epitaxis Cardiovascular Hyperlipidemia Gastrointestinal GERD, gastritis Neurologic CVA in 2018, Cerebral aneurysm Review of Systems (ROS) Constitutional Symptoms (General Health) Denies complaints or symptoms of Fatigue, Fever, Chills, Marked Weight Change. Eyes Denies complaints or symptoms of Dry Eyes, Vision Changes, Glasses / Contacts. Ear/Nose/Mouth/Throat Denies complaints or symptoms of Chronic sinus problems or rhinitis. Respiratory Denies complaints or symptoms of Chronic or frequent coughs, Shortness of Breath. Gastrointestinal Denies complaints or symptoms of Frequent diarrhea, Nausea, Vomiting. Endocrine Denies complaints or symptoms of Heat/cold intolerance. Genitourinary Denies complaints or symptoms of Frequent urination. Integumentary (Skin) Complains or has symptoms of Wounds - wound on right lower leg. Objective Constitutional Patient is hypertensive.. Pulse regular and within target range for patient.Marland Kitchen Respirations regular, non-labored and within target range.. Temperature is normal and within the target range for the patient.Marland Kitchen Appears in no distress. Vitals Time Taken: 1:55 PM, Height: 66 in, Source: Stated, Weight: 178 lbs, Source: Stated, BMI: 28.7, Temperature: 98.1 F, Pulse: 79 bpm, Respiratory Rate: 16 breaths/min, Blood Pressure: 160/69 mmHg. Respiratory work of breathing is normal. Cardiovascular Atrial fibrillation. No murmurs. Pulses in the right leg were easily palpable. Psychiatric appears at normal baseline. General Notes: Wound exam; she has an all to common geographic pattern of small wounds and inflamed  skin on the right medial lower ankle. She has lymphedema from the mid calf towards the knee. [Inverted bottle sign] I think most of the pain she has is related to this condition. Unless we can control the swelling and the venous pressure in this area is likely that she will have further breakdown in the medial ankle and even is beginning to have further breakdown in the lateral part of the right ankle. I do not believe any of this is currently cellulitis Integumentary (Hair, Skin) Changes of chronic venous insufficiency in the right greater than left leg. On the right medial leg there is marked stasis dermatitis. Very tightly adherent fibrotic skin. In the upper part of the leg some degree of lymphedema bilaterally. Wound #1 status is Open. Original cause of wound was Gradually Appeared. The wound is located on the Right,Medial Malleolus. The wound measures 3.5cm length x 2cm  width x 0.1cm depth; 5.498cm^2 area and 0.55cm^3 volume. There is Fat Layer (Subcutaneous Tissue) Exposed exposed. There is no tunneling or undermining noted. There is a medium amount of serosanguineous drainage noted. The wound margin is indistinct and nonvisible. There is large (67-100%) pink granulation within the wound bed. There is a small (1-33%) amount of necrotic tissue within the wound bed including Adherent Slough. Assessment Active Problems ICD-10 Non-pressure chronic ulcer of right ankle limited to breakdown of skin Chronic venous hypertension (idiopathic) with ulcer and inflammation of right lower extremity Lymphedema, not elsewhere classified Plan Follow-up Appointments: Return Appointment in 2 weeks. Dressing Change Frequency: Change Dressing every other day. - by SNF staff as ordered until seen at wound center in 2 weeks Skin Barriers/Peri-Wound Care: Barrier cream TCA Cream or Ointment Wound Cleansing: Wound #1 Right,Medial Malleolus: Clean wound with Wound Cleanser May shower and wash wound with  soap and water. Primary Wound Dressing: Wound #1 Right,Medial Malleolus: Calcium Alginate with Silver Secondary Dressing: Wound #1 Right,Medial Malleolus: Dry Gauze Edema Control: Wound #1 Right,Medial Malleolus: 3 Layer Compression System - Right Lower Extremity #1 we will use silver alginate with liberal TCA to the inflamed surrounding skin 2. I do not believe that there is cellulitis currently active 3. This needs to be putting compression not only to heal these areas to prevent any further breakdown she will definitely need compression stockings. 4. I do not believe this cellulitis she was recently admitted for has anything to do with this presentation. It is likely she has had this for a very prolonged period of time. I note that she saw Dr. Aleda Grana about this as well. [[facility]] I spent 35 minutes in the epic research, face-to-face evaluation discussion with the patient and preparation of this record Electronic Signature(s) Signed: 06/12/2019 5:22:39 PM By: Linton Ham MD Entered By: Linton Ham on 06/12/2019 15:12:45 -------------------------------------------------------------------------------- HxROS Details Patient Name: Date of Service: Samantha Bishop, Samantha Bishop 06/12/2019 1:15 PM Medical Record [id number] Patient Account Number: [id number] Date of Birth/Sex: Treating RN: February 05, 1925 (86 y.o. [name] Primary Care Provider: Pricilla Holm Other Clinician: Referring Provider: Treating Provider/Extender:Donyale Berthold, Tenna Child, [place] Siren in Treatment: 0 Information Obtained From Patient Constitutional Symptoms (General Health) Complaints and Symptoms: Negative for: Fatigue; Fever; Chills; Marked Weight Change Eyes Complaints and Symptoms: Negative for: Dry Eyes; Vision Changes; Glasses / Contacts Ear/Nose/Mouth/Throat Complaints and Symptoms: Negative for: Chronic sinus problems or rhinitis Medical History: Past Medical History  Notes: recent epitaxis Respiratory Complaints and Symptoms: Negative for: Chronic or frequent coughs; Shortness of Breath Medical History: Positive for: Sleep Apnea Gastrointestinal Complaints and Symptoms: Negative for: Frequent diarrhea; Nausea; Vomiting Medical History: Past Medical History Notes: GERD, gastritis Endocrine Complaints and Symptoms: Negative for: Heat/cold intolerance Genitourinary Complaints and Symptoms: Negative for: Frequent urination Integumentary (Skin) Complaints and Symptoms: Positive for: Wounds - wound on right lower leg Hematologic/Lymphatic Cardiovascular Medical History: Positive for: Arrhythmia - A-Fib; Deep Vein Thrombosis - left leg; Hypertension; Peripheral Venous Disease Past Medical History Notes: Hyperlipidemia Immunological Musculoskeletal Medical History: Positive for: Osteoarthritis Neurologic Medical History: Past Medical History Notes: CVA in 2018, Cerebral aneurysm Oncologic Immunizations Pneumococcal Vaccine: Received Pneumococcal Vaccination: Yes Implantable Devices None Family and Social History Cancer: Yes - Child; Heart Disease: Yes - Mother; Stroke: Yes - Father; Never smoker; Marital Status - Widowed; Alcohol Use: Never; Drug Use: No History; Caffeine Use: Rarely; Financial Concerns: No; Food, Clothing or Shelter Needs: No; Support System Lacking: No; Transportation Concerns: No Electronic Signature(s) Signed: 06/12/2019 5:22:39 PM By:  Linton Ham MD Signed: 07/05/2019 2:25:48 PM By: Levan Hurst RN, BSN Entered By: Levan Hurst on 06/12/2019 14:20:23 -------------------------------------------------------------------------------- [place] Details Patient Name: Date of Service: Samantha Bishop, Samantha Bishop 06/12/2019 Medical Record [id number] Patient Account Number: [id number] Date of Birth/Sex: Treating RN: 11/03/1924 (86 y.o. [name] Primary Care Provider: Pricilla Holm Other  Clinician: Referring Provider: Treating Provider/Extender:Verdie Barrows, Tenna Child, [place] Siren in Treatment: 0 Diagnosis Coding ICD-10 Codes Code Description [id number] Non-pressure chronic ulcer of right ankle limited to breakdown of skin I87.331 Chronic venous hypertension (idiopathic) with ulcer and inflammation of right lower extremity I89.0 Lymphedema, not elsewhere classified Facility Procedures CPT4 Code Description: [id number] [place] VISIT-LEV 3 EST PT [id number] (Facility Use Only) [phone] - APPLY MULTLAY COMPRS LWR RT LEG Modifier: 25 1 Quantity: 1 Physician Procedures CPT4 Code Description: [id number] WC PHYS LEVEL 3 NEW PT ICD-10 Diagnosis Description [id number] Non-pressure chronic ulcer of right ankle limited to br I87.331 Chronic venous hypertension (idiopathic) with ulcer and lower extremity I89.0 Lymphedema, not  elsewhere classified Modifier: eakdown of skin inflammation of Quantity: 1 right Electronic Signature(s) Signed: 06/12/2019 5:22:39 PM By: Linton Ham MD Signed: 06/12/2019 5:23:18 PM By: Carlene Coria RN Entered By: Carlene Coria on 06/12/2019 16:41:56

## 2019-07-06 DIAGNOSIS — F4323 Adjustment disorder with mixed anxiety and depressed mood: Secondary | ICD-10-CM

## 2019-07-06 DIAGNOSIS — K5790 Diverticulosis of intestine, part unspecified, without perforation or abscess without bleeding: Secondary | ICD-10-CM

## 2019-07-06 DIAGNOSIS — Z96653 Presence of artificial knee joint, bilateral: Secondary | ICD-10-CM

## 2019-07-06 DIAGNOSIS — Z86718 Personal history of other venous thrombosis and embolism: Secondary | ICD-10-CM

## 2019-07-06 DIAGNOSIS — I69398 Other sequelae of cerebral infarction: Secondary | ICD-10-CM

## 2019-07-06 DIAGNOSIS — Z85828 Personal history of other malignant neoplasm of skin: Secondary | ICD-10-CM

## 2019-07-06 DIAGNOSIS — I4821 Permanent atrial fibrillation: Secondary | ICD-10-CM | POA: Diagnosis not present

## 2019-07-06 DIAGNOSIS — R32 Unspecified urinary incontinence: Secondary | ICD-10-CM

## 2019-07-06 DIAGNOSIS — L03115 Cellulitis of right lower limb: Secondary | ICD-10-CM

## 2019-07-06 DIAGNOSIS — M1990 Unspecified osteoarthritis, unspecified site: Secondary | ICD-10-CM | POA: Diagnosis not present

## 2019-07-06 DIAGNOSIS — I890 Lymphedema, not elsewhere classified: Secondary | ICD-10-CM

## 2019-07-06 DIAGNOSIS — G4730 Sleep apnea, unspecified: Secondary | ICD-10-CM

## 2019-07-06 DIAGNOSIS — D649 Anemia, unspecified: Secondary | ICD-10-CM

## 2019-07-06 DIAGNOSIS — R2689 Other abnormalities of gait and mobility: Secondary | ICD-10-CM

## 2019-07-06 DIAGNOSIS — N200 Calculus of kidney: Secondary | ICD-10-CM

## 2019-07-06 DIAGNOSIS — M5481 Occipital neuralgia: Secondary | ICD-10-CM

## 2019-07-06 DIAGNOSIS — I4891 Unspecified atrial fibrillation: Secondary | ICD-10-CM

## 2019-07-06 DIAGNOSIS — M6281 Muscle weakness (generalized): Secondary | ICD-10-CM

## 2019-07-06 DIAGNOSIS — Z9181 History of falling: Secondary | ICD-10-CM

## 2019-07-06 DIAGNOSIS — M8580 Other specified disorders of bone density and structure, unspecified site: Secondary | ICD-10-CM | POA: Diagnosis not present

## 2019-07-06 DIAGNOSIS — Z7901 Long term (current) use of anticoagulants: Secondary | ICD-10-CM

## 2019-07-06 DIAGNOSIS — Z7951 Long term (current) use of inhaled steroids: Secondary | ICD-10-CM

## 2019-07-06 DIAGNOSIS — Z86711 Personal history of pulmonary embolism: Secondary | ICD-10-CM

## 2019-07-06 DIAGNOSIS — I872 Venous insufficiency (chronic) (peripheral): Secondary | ICD-10-CM

## 2019-07-06 DIAGNOSIS — E785 Hyperlipidemia, unspecified: Secondary | ICD-10-CM

## 2019-07-06 DIAGNOSIS — K219 Gastro-esophageal reflux disease without esophagitis: Secondary | ICD-10-CM

## 2019-07-06 DIAGNOSIS — I10 Essential (primary) hypertension: Secondary | ICD-10-CM

## 2019-07-06 DIAGNOSIS — I671 Cerebral aneurysm, nonruptured: Secondary | ICD-10-CM

## 2019-07-10 DIAGNOSIS — I890 Lymphedema, not elsewhere classified: Secondary | ICD-10-CM | POA: Diagnosis not present

## 2019-07-10 DIAGNOSIS — I4821 Permanent atrial fibrillation: Secondary | ICD-10-CM | POA: Diagnosis not present

## 2019-07-10 DIAGNOSIS — M6281 Muscle weakness (generalized): Secondary | ICD-10-CM | POA: Diagnosis not present

## 2019-07-10 DIAGNOSIS — R2689 Other abnormalities of gait and mobility: Secondary | ICD-10-CM | POA: Diagnosis not present

## 2019-07-10 DIAGNOSIS — I69398 Other sequelae of cerebral infarction: Secondary | ICD-10-CM | POA: Diagnosis not present

## 2019-07-10 DIAGNOSIS — I10 Essential (primary) hypertension: Secondary | ICD-10-CM | POA: Diagnosis not present

## 2019-07-22 ENCOUNTER — Other Ambulatory Visit: Payer: Self-pay | Admitting: Internal Medicine

## 2019-07-23 ENCOUNTER — Ambulatory Visit: Payer: Medicare Other | Admitting: Internal Medicine

## 2019-07-24 ENCOUNTER — Ambulatory Visit (INDEPENDENT_AMBULATORY_CARE_PROVIDER_SITE_OTHER): Payer: Medicare Other | Admitting: Internal Medicine

## 2019-07-24 ENCOUNTER — Telehealth: Payer: Self-pay | Admitting: Internal Medicine

## 2019-07-24 ENCOUNTER — Other Ambulatory Visit: Payer: Self-pay

## 2019-07-24 ENCOUNTER — Encounter: Payer: Self-pay | Admitting: Internal Medicine

## 2019-07-24 VITALS — BP 136/78 | HR 95 | Temp 98.2°F | Ht 66.0 in | Wt 176.5 lb

## 2019-07-24 DIAGNOSIS — R040 Epistaxis: Secondary | ICD-10-CM | POA: Diagnosis not present

## 2019-07-24 DIAGNOSIS — L03115 Cellulitis of right lower limb: Secondary | ICD-10-CM | POA: Diagnosis not present

## 2019-07-24 DIAGNOSIS — E782 Mixed hyperlipidemia: Secondary | ICD-10-CM

## 2019-07-24 DIAGNOSIS — I10 Essential (primary) hypertension: Secondary | ICD-10-CM

## 2019-07-24 MED ORDER — GABAPENTIN 300 MG PO CAPS: 600.0000 mg | ORAL_CAPSULE | Freq: Three times a day (TID) | ORAL | 3 refills | Status: DC

## 2019-07-24 MED ORDER — SALINE SPRAY 0.65 % NA SOLN: 1.0000 | NASAL | 11 refills | Status: DC | PRN

## 2019-07-24 MED ORDER — HYDROCHLOROTHIAZIDE 25 MG PO TABS: 12.5000 mg | ORAL_TABLET | Freq: Every day | ORAL | 3 refills | Status: DC

## 2019-07-24 MED ORDER — PRAVASTATIN SODIUM 20 MG PO TABS: 20.0000 mg | ORAL_TABLET | Freq: Every day | ORAL | 3 refills | Status: DC

## 2019-07-24 MED ORDER — APIXABAN 5 MG PO TABS: ORAL_TABLET | ORAL | 3 refills | Status: DC

## 2019-07-24 NOTE — Progress Notes (Signed)
   Subjective:   Patient ID: Samantha Bishop, female    DOB: 1924/12/06, 86 y.o.   MRN: [id number]  HPI The patient is a 86 YO female coming in for nursing home discharge (went there after hospital stay for nose bleed, cellulitis). She is doing well since being home. Denies any more nosebleeds since being home. Still going to wound care and leg is doing much better. Off antibiotics for some time. Has had both covid-19 vaccines. Denies fevers or chills. No falls since being home. Denies SOB or chest pains. Denies diarrhea, constipation, abdominal pain. Needs refills on all meds as SNF provider gave only 30 day with no refills.  PMH, [facility], social history reviewed and updated   Review of Systems  Constitutional: Negative.   HENT: Negative.   Eyes: Negative.   Respiratory: Negative for cough, chest tightness and shortness of breath.   Cardiovascular: Negative for chest pain, palpitations and leg swelling.  Gastrointestinal: Negative for abdominal distention, abdominal pain, constipation, diarrhea, nausea and vomiting.  Musculoskeletal: Negative.   Skin: Negative.   Neurological: Negative.   Psychiatric/Behavioral: Negative.     Objective:  Physical Exam Constitutional:      Appearance: She is well-developed.  HENT:     Head: Normocephalic and atraumatic.  Cardiovascular:     Rate and Rhythm: Normal rate and regular rhythm.  Pulmonary:     Effort: Pulmonary effort is normal. No respiratory distress.     Breath sounds: Normal breath sounds. No wheezing or rales.  Abdominal:     General: Bowel sounds are normal. There is no distension.     Palpations: Abdomen is soft.     Tenderness: There is no abdominal tenderness. There is no rebound.  Musculoskeletal:     Cervical back: Normal range of motion.  Skin:    General: Skin is warm and dry.  Neurological:     Mental Status: She is alert and oriented to person, place, and time.     Coordination: Coordination normal.     Vitals:   07/24/19 1307  BP: 136/78  Pulse: 95  Temp: 98.2 F (36.8 C)  TempSrc: Oral  SpO2: 96%  Weight: 176 lb 8 oz (80.1 kg)  Height: 5\' 6"  (1.676 m)    This visit occurred during the SARS-CoV-2 public health emergency.  Safety protocols were in place, including screening questions prior to the visit, additional usage of staff PPE, and extensive cleaning of exam room while observing appropriate contact time as indicated for disinfecting solutions.   Assessment & Plan:  Visit time 20 minutes in face to face communication with patient and coordination of care, additional 10 minutes spent in record review, coordination or care, ordering tests, communicating/referring to other healthcare professionals, documenting in medical records all on the same day of the visit for total time 30 minutes spent on the visit.

## 2019-07-24 NOTE — Patient Instructions (Signed)
We have sent in all the refills for your medicines so call us if there are any problems with them.

## 2019-07-24 NOTE — Assessment & Plan Note (Signed)
ENT did intervene on this and no recurrent nosebleeds. Hg levels stable at SNF and no indication for repeat today.

## 2019-07-24 NOTE — Assessment & Plan Note (Signed)
BP at goal on 1/2 hctz daily. Refilled and recent BMP stable without indication for change.

## 2019-07-24 NOTE — Assessment & Plan Note (Signed)
Resolved at this time.  °

## 2019-07-24 NOTE — Telephone Encounter (Signed)
    Pt c/o medication issue:  1. Name of Medication: apixaban (ELIQUIS) 5 MG TABS tablet  2. How are you currently taking this medication (dosage and times per day)? n/a  3. Are you having a reaction (difficulty breathing--STAT)? no  4. What is your medication issue? Patient calling to report medication too costly ($300).  Patient states her insurance is requesting a call at [phone] to do appeal

## 2019-07-24 NOTE — Assessment & Plan Note (Signed)
Needs refill pravastatin 20 mg daily, lipid panel within last year at goal.

## 2019-07-25 NOTE — Telephone Encounter (Signed)
Would you like to change the patients medication to something affordable? Please advise

## 2019-07-26 NOTE — Telephone Encounter (Signed)
Please do appeal

## 2019-07-30 DIAGNOSIS — Z4800 Encounter for change or removal of nonsurgical wound dressing: Secondary | ICD-10-CM | POA: Diagnosis not present

## 2019-07-30 NOTE — Telephone Encounter (Signed)
Key: [id number]

## 2019-08-03 NOTE — Telephone Encounter (Signed)
Member was not found.   New Key: Samantha Bishop

## 2019-08-06 NOTE — Telephone Encounter (Signed)
PA came back that the medication is covered under patient current benefit plan.   Called the pharmacy and she stated that rx was 300.00 for a 90 day supply.   LVM for pt or pt son to call back and we can discuss the Patient Assistance Option, etc.

## 2019-08-06 NOTE — Telephone Encounter (Signed)
Member was still not found.   Called the pharmacy:   ID [id number]  BIN: [id number] PCN: [id number] GRP: [id number]   Key: [id number]

## 2019-08-15 NOTE — Telephone Encounter (Signed)
Lvm another vm. I am completing note until someone calls back.

## 2019-08-22 NOTE — Progress Notes (Signed)
Samantha Bishop, Samantha Bishop ([id number]) Visit Report for 06/26/2019 Arrival Information Details Patient Name: Date of Service: Samantha, Bishop 06/26/2019 1:45 PM Medical Record [id number] Patient Account Number: [id number] Date of Birth/Sex: Treating RN: Feb 07, 1925 (86 y.o. Samantha Bishop Primary Care Samantha Bishop: Samantha Bishop Other Clinician: Referring Samantha Bishop: Treating Kimimila Tauzin/Extender:Samantha Bishop, Samantha Bishop in Treatment: 2 Visit Information History Since Last Visit Added or deleted any medications: No Patient Arrived: Wheel Chair Any new allergies or adverse reactions: No Arrival Time: 13:35 Had a fall or experienced change in No Accompanied By: self activities of daily living that may affect Transfer Assistance: None risk of falls: Patient Identification Verified: Yes Signs or symptoms of abuse/neglect since last No Secondary Verification Process Yes visito Completed: Hospitalized since last visit: No Patient Requires Transmission- No Implantable device outside of the clinic excluding No Based Precautions: cellular tissue based products placed in the center Patient Has Alerts: Yes since last visit: Patient Alerts: Patient on Blood Has Dressing in Place as Prescribed: Yes Thinner Pain Present Now: Yes Electronic Signature(s) Signed: 08/22/2019 9:23:26 AM By: Sandre Kitty Entered By: Sandre Kitty on 06/26/2019 13:35:44 -------------------------------------------------------------------------------- Compression Therapy Details Patient Name: Date of Service: Samantha Bishop 06/26/2019 1:45 PM Medical Record [id number] Patient Account Number: [id number] Date of Birth/Sex: Treating RN: 1924-09-16 (86 y.o. Samantha Bishop Primary Care Keya Wynes: Samantha Bishop Other Clinician: Referring Jammi Morrissette: Treating Samantha Bishop/Extender:Samantha Bishop, Samantha Bishop in Treatment: 2 Compression Therapy Performed for Wound Wound  #1 Right,Medial Malleolus Assessment: Performed By: Samantha Church, RN Compression Type: Three Layer Post Procedure Diagnosis Same as Pre-procedure Electronic Signature(s) Signed: 06/27/2019 5:45:19 PM By: Carlene Coria RN Entered By: Carlene Coria on 06/26/2019 15:10:35 -------------------------------------------------------------------------------- Encounter Discharge Information Details Patient Name: Date of Service: Samantha Bishop. 06/26/2019 1:45 PM Medical Record [id number] Patient Account Number: [id number] Date of Birth/Sex: Treating RN: [birth year] (86 y.o. [name] Primary Care Jaylin Benzel: Samantha Bishop Other Clinician: Referring Marque Bango: Treating Samantha Bishop in Treatment: 2 Encounter Discharge Information Items Discharge Condition: Stable Ambulatory Status: Wheelchair Discharge Destination: [place] Telephoned: No Orders Sent: Yes Transportation: Other transportation from Accompanied By: facility Schedule Follow-up Yes Appointment: Clinical Summary of Care: Patient Declined Electronic Signature(s) Signed: 06/26/2019 5:22:24 PM By: Kela Millin Entered By: Kela Millin on 06/26/2019 15:30:51 -------------------------------------------------------------------------------- Lower Extremity Assessment Details Patient Name: Date of Service: Samantha Bishop 06/26/2019 1:45 PM Medical Record [id number] Patient Account Number: [id number] Date of Birth/Sex: Treating RN: 1924-12-02 (86 y.o. Samantha Bishop Primary Care Kaesen Rodriguez: Samantha Bishop Other Clinician: Referring Reyana Leisey: Treating Kindred Reidinger/Extender:Samantha Bishop, Samantha Bishop in Treatment: 2 Edema Assessment Assessed: [Left: No] [Right: No] Edema: [Left: Ye] [Right: s] Calf Left: Right: Point of Measurement: 31 cm From Medial Instep cm 36.6 cm Ankle Left: Right: Point of  Measurement: 10 cm From Medial Instep cm 21.5 cm Vascular Assessment Pulses: Dorsalis Pedis Palpable: [Right:Yes] Electronic Signature(s) Signed: 06/26/2019 5:24:43 PM By: Samantha Gouty RN, Samantha Bishop Entered By: Samantha Bishop on 06/26/2019 13:50:49 -------------------------------------------------------------------------------- Multi Wound Chart Details Patient Name: Date of Service: Samantha Bishop. 06/26/2019 1:45 PM Medical Record [id number] Patient Account Number: [id number] Date of Birth/Sex: Treating RN: January 07, 1925 (86 y.o. Samantha Bishop Primary Care Hasna Stefanik: Samantha Bishop Other Clinician: Referring Brylen Wagar: Treating Samantha Bishop/Extender:Samantha Bishop, Samantha Bishop in Treatment: 2 Vital Signs Height(in): 66 Pulse(bpm): 87 Weight(lbs): 178 Blood Pressure(mmHg): 184/88 Body Mass Index(BMI): 29 Temperature(F): 98.5 Respiratory 16 Rate(breaths/min): Photos: [1:No Photos] [N/A:N/A] Wound Location: [1:Right, Medial Malleolus] [N/A:N/A] Wounding Event: [1:Gradually Appeared] [N/A:N/A] Primary Etiology: [  1:Venous Leg Ulcer] [N/A:N/A] Comorbid History: [1:Sleep Apnea, Arrhythmia, Deep Vein Thrombosis, Hypertension, Peripheral Venous Disease, Osteoarthritis] [N/A:N/A] Date Acquired: [1:10/09/2018] [N/A:N/A] Weeks of Treatment: [1:2] [N/A:N/A] Wound Status: [1:Open] [N/A:N/A] Clustered Wound: [1:Yes] [N/A:N/A] Clustered Quantity: [1:4] [N/A:N/A] Measurements L x W x D [1:0x0x0] [N/A:N/A] (cm) Area (cm) : [1:0] [N/A:N/A] Volume (cm) : [1:0] [N/A:N/A] % Reduction in Area: [1:100.00%] [N/A:N/A] % Reduction in Volume: [1:100.00%] [N/A:N/A] Classification: [1:Full Thickness Without Exposed Support Structures] [N/A:N/A] Exudate Amount: [1:None Present] [N/A:N/A] Wound Margin: [1:Indistinct, nonvisible] [N/A:N/A] Granulation Amount: [1:None Present (0%)] [N/A:N/A] Necrotic Amount: [1:None Present (0%)] [N/A:N/A] Exposed Structures: [1:Fascia: No Fat  Layer (Subcutaneous Tissue) Exposed: No Tendon: No Muscle: No Joint: No Bone: No] [N/A:N/A] Epithelialization: [1:Large (67-100%)] [N/A:N/A] Assessment Notes: [1:stasis dermatitis to lower N/A leg] Treatment Notes Electronic Signature(s) Signed: 06/26/2019 5:18:57 PM By: Linton Ham MD Signed: 06/27/2019 5:45:19 PM By: Carlene Coria RN Entered By: Linton Ham on 06/26/2019 14:52:42 -------------------------------------------------------------------------------- Multi-Disciplinary Care Plan Details Patient Name: Date of Service: Samantha Bishop. 06/26/2019 1:45 PM Medical Record [id number] Patient Account Number: [id number] Date of Birth/Sex: Treating RN: Aug 20, 1924 (86 y.o. Samantha Bishop Primary Care Carlye Panameno: Samantha Bishop Other Clinician: Referring Aleese Kamps: Treating Sky Borboa/Extender:Samantha Bishop, Samantha Bishop in Treatment: 2 Active Inactive Electronic Signature(s) Signed: 06/27/2019 5:45:19 PM By: Carlene Coria RN Entered By: Carlene Coria on 06/26/2019 15:11:53 -------------------------------------------------------------------------------- Pain Assessment Details Patient Name: Date of Service: SHEREA, FARNELL 06/26/2019 1:45 PM Medical Record [id number] Patient Account Number: [id number] Date of Birth/Sex: Treating RN: [birth year] (86 y.o. Samantha Bishop Primary Care Gearldean Lomanto: Samantha Bishop Other Clinician: Referring Hadessah Grennan: Treating Laylynn Campanella/Extender:Samantha Bishop, Samantha Bishop in Treatment: 2 Active Problems Location of Pain Severity and Description of Pain Patient Has Paino Yes Site Locations Rate the pain. Current Pain Level: 6 Pain Management and Medication Current Pain Management: Electronic Signature(s) Signed: 06/27/2019 5:45:19 PM By: Carlene Coria RN Signed: 08/22/2019 9:23:26 AM By: Sandre Kitty Entered By: Sandre Kitty on 06/26/2019  13:36:10 -------------------------------------------------------------------------------- Patient/Caregiver Education Details Patient Name: Date of Service: Samantha Bishop 2/16/2021andnbsp1:45 PM Medical Record [id number] Patient Account Number: [id number] Date of Birth/Gender: Aug 13, 1924 (86 y.o. F) Treating RN: Carlene Coria Primary Care Physician: Samantha Bishop Other Clinician: Referring Physician: Treating Physician/Extender:Samantha Bishop, Samantha Bishop in Treatment: 2 Education Assessment Education Provided To: Patient Education Topics Provided Infection: Methods: Explain/Verbal Responses: State content correctly Electronic Signature(s) Signed: 06/27/2019 5:45:19 PM By: Carlene Coria RN Entered By: Carlene Coria on 06/26/2019 15:12:01 -------------------------------------------------------------------------------- Wound Assessment Details Patient Name: Date of Service: SREENIKA, DICOLA 06/26/2019 1:45 PM Medical Record [id number] Patient Account Number: [id number] Date of Birth/Sex: Treating RN: [birth year] (85 y.o. [name] Primary Care Tacoya Altizer: Samantha Bishop Other Clinician: Referring Delroy Ordway: Treating Hallie Ertl/Extender:Samantha Bishop, Samantha Bishop in Treatment: 2 Wound Status Wound Number: 1 Primary Venous Leg Ulcer Etiology: Wound Location: Right, Medial Malleolus Wound Healed - Epithelialized Wounding Event: Gradually Appeared Status: Date Acquired: 10/09/2018 Comorbid Sleep Apnea, Arrhythmia, Deep Vein Weeks Of Treatment: 2 History: Thrombosis, Hypertension, Peripheral Venous Clustered Wound: Yes Disease, Osteoarthritis Photos Wound Measurements Length: (cm) 0 % Reduct Width: (cm) 0 % Reduct Depth: (cm) 0 Epitheli Clustered Quantity: 4 Tunnelin Area: (cm) 0 Undermi Volume: (cm) 0 Wound Description Classification: Full Thickness Without Exposed Support Foul Odo Structures  [place]/F Wound Indistinct, nonvisible Margin: Exudate None Present Amount: Wound Bed Granulation Amount: None Present (0%) Necrotic Amount: None Present (0%) Fascia Fat La Tendon Muscle Joint Bone E r After Cleansing: No ibrino No Exposed Structure Exposed: No yer (Subcutaneous Tissue) Exposed:  No Exposed: No Exposed: No Exposed: No xposed: No ion in Area: 100% ion in Volume: 100% alization: Large (67-100%) g: No ning: No Assessment Notes stasis dermatitis to lower leg Treatment Notes Wound #1 (Right, Medial Malleolus) 1. Cleanse With Wound Cleanser Soap and water 2. Periwound Care Moisturizing lotion TCA Cream 6. Support Layer Applied 3 layer compression Water quality scientist) Signed: 08/22/2019 9:06:45 AM By: Levan Hurst RN, Samantha Bishop Previous Signature: 06/29/2019 5:06:31 PM Version By: Mikeal Hawthorne EMT/HBOT Previous Signature: 06/29/2019 6:14:01 PM Version By: Samantha Gouty RN, Samantha Bishop Previous Signature: 06/26/2019 5:24:43 PM Version By: Samantha Gouty RN, Samantha Bishop Entered By: Levan Hurst on 07/27/2019 08:38:23 -------------------------------------------------------------------------------- [place] Details Patient Name: Date of Service: Samantha Bishop. 06/26/2019 1:45 PM Medical Record [id number] Patient Account Number: [id number] Date of Birth/Sex: Treating RN: 11-01-1924 (86 y.o. Samantha Bishop Primary Care Gavon Majano: Samantha Bishop Other Clinician: Referring Jamiria Langill: Treating Halen Antenucci/Extender:Samantha Bishop, Samantha Bishop in Treatment: 2 Vital Signs Time Taken: 13:35 Temperature (F): 98.5 Height (in): 66 Pulse (bpm): 87 Weight (lbs): 178 Respiratory Rate (breaths/min): 16 Body Mass Index (BMI): 28.7 Blood Pressure (mmHg): 184/88 Reference Range: 80 - 120 mg / dl Electronic Signature(s) Signed: 08/22/2019 9:23:26 AM By: Sandre Kitty Entered By: Sandre Kitty on 06/26/2019 13:36:03

## 2019-11-06 NOTE — Progress Notes (Signed)
Samantha Bishop, Samantha Bishop ([id number]) Visit Report for 06/12/2019 Allergy List Details Patient Name: Date of Service: Samantha Bishop, Samantha [facility] 06/12/2019 1:15 PM Medical Record Number: [id number] Patient Account Number: [id number] Date of Birth/Sex: Treating RN: May 20, 1924 (86 y.o. Samantha Bishop Primary Care Samantha Bishop: Samantha Bishop Other Clinician: Referring Samantha Bishop: Treating Samantha Bishop/Extender: Samantha Bishop in Treatment: 0 Allergies Active Allergies codeine Reaction: nausea Severity: Moderate penicillin Reaction: rash Severity: Moderate Allergy Notes Electronic Signature(s) Signed: 07/05/2019 2:25:48 PM By: Samantha Hurst RN, BSN Entered By: Samantha Bishop on 06/12/2019 14:00:38 -------------------------------------------------------------------------------- Arrival Information Details Patient Name: Date of Service: Samantha Bishop MS, [name] 06/12/2019 1:15 PM Medical Record Number: [id number] Patient Account Number: [id number] Date of Birth/Sex: Treating RN: [birth year] (86 y.o. Samantha Bishop Primary Care Samantha Bishop: Samantha Bishop Other Clinician: Referring Samantha Bishop: Treating Samantha Bishop/Extender: Samantha Bishop in Treatment: 0 Visit Information Patient Arrived: Wheel Chair Arrival Time: 13:53 Accompanied By: alone Transfer Assistance: None Patient Identification Verified: Yes Secondary Verification Process Completed: Yes Patient Requires Transmission-Based Precautions: No Patient Has Alerts: Yes Patient Alerts: Patient on Blood Thinner Electronic Signature(s) Signed: 07/05/2019 2:25:48 PM By: Samantha Hurst RN, BSN Entered By: Samantha Bishop on 06/12/2019 13:57:28 -------------------------------------------------------------------------------- Clinic Level of Care Assessment Details Patient Name: Date of Service: Samantha [facility] MS, [name] 06/12/2019 1:15 PM Medical Record Number: [id number] Patient Account Number:  [id number] Date of Birth/Sex: Treating RN: 28-Apr-1925 (86 y.o. Samantha Bishop Primary Care Samantha Bishop: Samantha Bishop Other Clinician: Referring Samantha Bishop: Treating Samantha Bishop/Extender: Samantha Bishop in Treatment: 0 Clinic Level of Care Assessment Items TOOL 1 Quantity Score X- 1 0 Use when EandM and Procedure is performed on INITIAL visit ASSESSMENTS - Nursing Assessment / Reassessment X- 1 20 General Physical Exam (combine w/ comprehensive assessment (listed just below) when performed on new pt. evals) X- 1 25 Comprehensive Assessment (HX, ROS, Risk Assessments, Wounds Hx, etc.) ASSESSMENTS - Wound and Skin Assessment / Reassessment []  - 0 Dermatologic / Skin Assessment (not related to wound area) ASSESSMENTS - Ostomy and/or Continence Assessment and Care []  - 0 Incontinence Assessment and Management []  - 0 Ostomy Care Assessment and Management (repouching, etc.) PROCESS - Coordination of Care X - Simple Patient / Family Education for ongoing care 1 15 []  - 0 Complex (extensive) Patient / Family Education for ongoing care X- 1 10 Staff obtains Programmer, systems, Records, T Results / Process Orders est []  - 0 Staff telephones HHA, Nursing Homes / Clarify orders / etc []  - 0 Routine Transfer to another Facility (non-emergent condition) []  - 0 Routine [facility] Admission (non-emergent condition) X- 1 15 New Admissions / Biomedical engineer / Ordering NPWT Apligraf, etc. , []  - 0 Emergency [facility] Admission (emergent condition) PROCESS - Special Needs []  - 0 Pediatric / Minor Patient Management []  - 0 Isolation Patient Management []  - 0 Hearing / Language / Visual special needs []  - 0 Assessment of Community assistance (transportation, D/C planning, etc.) []  - 0 Additional assistance / Altered mentation []  - 0 Support Surface(s) Assessment (bed, cushion, seat, etc.) INTERVENTIONS - Miscellaneous []  - 0 External ear exam []  - 0 Patient  Transfer (multiple staff / Civil Service fast streamer / Similar devices) []  - 0 Simple Staple / Suture removal (25 or less) []  - 0 Complex Staple / Suture removal (26 or more) []  - 0 Hypo/Hyperglycemic Management (do not check if billed separately) X- 1 15 Ankle / Brachial Index (ABI) - do not check if billed separately Has the patient been seen at  the [facility] within the last three years: Yes Total Score: 100 Level Of Care: New/Established - Level 3 Electronic Signature(s) Signed: 06/12/2019 5:23:18 PM By: Samantha Coria RN Signed: 06/12/2019 5:23:18 PM By: Samantha Coria RN Entered By: Samantha Bishop on 06/12/2019 16:41:33 -------------------------------------------------------------------------------- Encounter Discharge Information Details Patient Name: Date of Service: Samantha [facility] MS, [name] 06/12/2019 1:15 PM Medical Record Number: [id number] Patient Account Number: [id number] Date of Birth/Sex: Treating RN: 1925-04-06 (85 y.o. Samantha Bishop Primary Care Samantha Bishop: Samantha Bishop Other Clinician: Referring Samantha Bishop: Treating Samantha Bishop/Extender: Samantha Bishop in Treatment: 0 Encounter Discharge Information Items Discharge Condition: Stable Ambulatory Status: Wheelchair Discharge Destination: Home Transportation: Other Accompanied By: self Schedule Follow-up Appointment: Yes Clinical Summary of Care: Patient Declined Electronic Signature(s) Signed: 06/12/2019 4:57:44 PM By: Samantha Bishop Entered By: Samantha Bishop on 06/12/2019 15:17:02 -------------------------------------------------------------------------------- Lower Extremity Assessment Details Patient Name: Date of Service: Samantha [facility] MS, [name] 06/12/2019 1:15 PM Medical Record Number: [id number] Patient Account Number: [id number] Date of Birth/Sex: Treating RN: [birth year] (86 y.o. Samantha Bishop Primary Care Samantha Bishop: Samantha Bishop Other Clinician: Referring Samantha Bishop: Treating  Samantha Bishop/Extender: Samantha Bishop in Treatment: 0 Edema Assessment Assessed: Samantha Bishop: No] [Right: No] Edema: [Left: Ye] [Right: s] Calf Left: Right: Point of Measurement: 31 cm From Medial Instep cm 39.5 cm Ankle Left: Right: Point of Measurement: 10 cm From Medial Instep cm 21.8 cm Vascular Assessment Pulses: Dorsalis Pedis Palpable: [Right:Yes] Blood Pressure: Brachial: [Right:160] Ankle: [Right:Dorsalis Pedis: 150] Ankle Brachial Index: [Right:0.94] Electronic Signature(s) Signed: 07/05/2019 2:25:48 PM By: Samantha Hurst RN, BSN Entered By: Samantha Bishop on 06/12/2019 14:27:53 -------------------------------------------------------------------------------- Multi Wound Chart Details Patient Name: Date of Service: Samantha [facility] MS, [name] 06/12/2019 1:15 PM Medical Record Number: [id number] Patient Account Number: [id number] Date of Birth/Sex: Treating RN: 03-07-1925 (86 y.o. Samantha Bishop Primary Care Dani Danis: Samantha Bishop Other Clinician: Referring Ron Junco: Treating Maysel Mccolm/Extender: Samantha Bishop in Treatment: 0 Vital Signs Height(in): 66 Pulse(bpm): 79 Weight(lbs): 178 Blood Pressure(mmHg): 160/69 Body Mass Index(BMI): 29 Temperature(F): 98.1 Respiratory Rate(breaths/min): 16 Photos: [1:No Photos Right Malleolus - Medial] [N/A:N/A N/A] Wound Location: [1:Gradually Appeared] [N/A:N/A] Wounding Event: [1:Venous Leg Ulcer] [N/A:N/A] Primary Etiology: [1:Sleep Apnea, Arrhythmia, Deep Vein] [N/A:N/A] Comorbid History: [1:Thrombosis, Hypertension, Peripheral Venous Disease, Osteoarthritis 10/09/2018] [N/A:N/A] Date Acquired: [1:0] [N/A:N/A] Weeks of Treatment: [1:Open] [N/A:N/A] Wound Status: [1:Yes] [N/A:N/A] Clustered Wound: [1:4] [N/A:N/A] Clustered Quantity: [1:3.5x2x0.1] [N/A:N/A] Measurements L x W x D (cm) [1:5.498] [N/A:N/A] A (cm) : rea [1:0.55] [N/A:N/A] Volume (cm) : [1:Full Thickness  Without Exposed] [N/A:N/A] Classification: [1:Support Structures Medium] [N/A:N/A] Exudate Amount: [1:Serosanguineous] [N/A:N/A] Exudate Type: [1:red, brown] [N/A:N/A] Exudate Color: [1:Indistinct, nonvisible] [N/A:N/A] Wound Margin: [1:Large (67-100%)] [N/A:N/A] Granulation Amount: [1:Pink] [N/A:N/A] Granulation Quality: [1:Small (1-33%)] [N/A:N/A] Necrotic Amount: [1:Fat Layer (Subcutaneous Tissue)] [N/A:N/A] Exposed Structures: [1:Exposed: Yes Fascia: No Tendon: No Muscle: No Joint: No Bone: No None] [N/A:N/A] Treatment Notes Electronic Signature(s) Signed: 06/12/2019 5:22:39 PM By: Linton Ham MD Signed: 06/12/2019 5:23:18 PM By: Samantha Coria RN Entered By: Linton Ham on 06/12/2019 15:03:12 -------------------------------------------------------------------------------- Multi-Disciplinary Care Plan Details Patient Name: Date of Service: Samantha [facility] MS, [name] 06/12/2019 1:15 PM Medical Record Number: [id number] Patient Account Number: [id number] Date of Birth/Sex: Treating RN: [birth year] (86 y.o. Samantha Bishop Primary Care Amiliana Foutz: Samantha Bishop Other Clinician: Referring Kurtiss Wence: Treating Marcell Pfeifer/Extender: Samantha Bishop in Treatment: 0 Active Inactive Wound/Skin Impairment Nursing Diagnoses: Knowledge deficit related to ulceration/compromised skin integrity Goals: Patient/caregiver will verbalize understanding of skin care regimen Date Initiated: 06/12/2019  Target Resolution Date: 07/13/2019 Goal Status: Active Ulcer/skin breakdown will have a volume reduction of 30% by week 4 Date Initiated: 06/12/2019 Target Resolution Date: 07/13/2019 Goal Status: Active Interventions: Assess patient/caregiver ability to obtain necessary supplies Assess patient/caregiver ability to perform ulcer/skin care regimen upon admission and as needed Assess ulceration(s) every visit Notes: Electronic Signature(s) Signed: 06/12/2019 5:23:18 PM By: Samantha Coria RN Entered By: Samantha Bishop on 06/12/2019 14:44:38 -------------------------------------------------------------------------------- Pain Assessment Details Patient Name: Date of Service: Samantha [facility] MS, [name] 06/12/2019 1:15 PM Medical Record Number: [id number] Patient Account Number: [id number] Date of Birth/Sex: Treating RN: Dec 17, 1924 (86 y.o. Samantha Bishop Primary Care Draven Laine: Samantha Bishop Other Clinician: Referring Mecca Guitron: Treating Lynniah Janoski/Extender: Samantha Bishop in Treatment: 0 Active Problems Location of Pain Severity and Description of Pain Patient Has Paino Yes Site Locations Pain Location: Pain Location: Pain in Ulcers With Dressing Change: Yes Duration of the Pain. Constant / Intermittento Intermittent Rate the pain. Current Pain Level: 4 Worst Pain Level: 10 Least Pain Level: 0 Character of Pain Describe the Pain: Burning Pain Management and Medication Current Pain Management: Medication: Yes Cold Application: No Rest: No Massage: No Activity: No T.E.N.S.: No Heat Application: No Leg drop or elevation: No Is the Current Pain Management Adequate: Adequate How does your wound impact your activities of daily livingo Sleep: No Bathing: No Appetite: No Relationship With Others: No Bladder Continence: No Emotions: No Bowel Continence: No Work: No Toileting: No Drive: No Dressing: No Hobbies: No Engineer, maintenance) Signed: 07/05/2019 2:25:48 PM By: Samantha Hurst RN, BSN Entered By: Samantha Bishop on 06/12/2019 14:30:08 -------------------------------------------------------------------------------- Patient/Caregiver Education Details Patient Name: Date of Service: Samantha [facility] MS, [name] 2/2/2021andnbsp1:15 PM Medical Record Number: [id number] Patient Account Number: [id number] Date of Birth/Gender: Treating RN: August 28, 1924 (86 y.o. Samantha Bishop Primary Care Physician: Samantha Bishop Other  Clinician: Referring Physician: Treating Physician/Extender: Samantha Bishop in Treatment: 0 Education Assessment Education Provided To: Patient Education Topics Provided Wound/Skin Impairment: Methods: Explain/Verbal Responses: State content correctly Electronic Signature(s) Signed: 06/12/2019 5:23:18 PM By: Samantha Coria RN Entered By: Samantha Bishop on 06/12/2019 14:44:49 -------------------------------------------------------------------------------- Wound Assessment Details Patient Name: Date of Service: Samantha [facility] MS, [name] 06/12/2019 1:15 PM Medical Record Number: [id number] Patient Account Number: [id number] Date of Birth/Sex: Treating RN: 1924/06/12 (86 y.o. Samantha Bishop Primary Care Alessa Mazur: Samantha Bishop Other Clinician: Referring Sanaii Caporaso: Treating Alexiya Franqui/Extender: Samantha Bishop in Treatment: 0 Wound Status Wound Number: 1 Primary Venous Leg Ulcer Etiology: Wound Location: Right, Medial Malleolus Wound Open Wounding Event: Gradually Appeared Status: Date Acquired: 10/09/2018 Comorbid Sleep Apnea, Arrhythmia, Deep Vein Thrombosis, Hypertension, Weeks Of Treatment: 0 History: Peripheral Venous Disease, Osteoarthritis Clustered Wound: Yes Photos Wound Measurements Length: (cm) 3.5 Width: (cm) 2 Depth: (cm) 0.1 Clustered Quantity: 4 Area: (cm) 5.498 Volume: (cm) 0.55 % Reduction in Area: 0% % Reduction in Volume: 0% Epithelialization: None Tunneling: No Undermining: No Wound Description Classification: Full Thickness Without Exposed Support Structures Wound Margin: Indistinct, nonvisible Exudate Amount: Medium Exudate Type: Serosanguineous Exudate Color: red, brown Foul Odor After Cleansing: No Slough/Fibrino Yes Wound Bed Granulation Amount: Large (67-100%) Exposed Structure Granulation Quality: Pink Fascia Exposed: No Necrotic Amount: Small (1-33%) Fat Layer (Subcutaneous Tissue)  Exposed: Yes Necrotic Quality: Adherent Slough Tendon Exposed: No Muscle Exposed: No Joint Exposed: No Bone Exposed: No Electronic Signature(s) Signed: 06/19/2019 4:28:10 PM By: Mikeal Hawthorne EMT/HBOT Signed: 11/06/2019 5:26:37 PM By: Samantha Coria RN Entered By: Mikeal Hawthorne on 06/19/2019 15:19:30 -------------------------------------------------------------------------------- Vitals  Details Patient Name: Date of Service: [place] [facility], [place] RY F. 06/12/2019 1:15 PM Medical Record Number: [id number] Patient Account Number: [id number] Date of Birth/Sex: Treating RN: Oct 06, 1924 (86 y.o. Samantha Bishop Primary Care Farah Lepak: Samantha Bishop Other Clinician: Referring Anique Beckley: Treating Sundy Houchins/Extender: Samantha Bishop in Treatment: 0 Vital Signs Time Taken: 13:55 Temperature (F): 98.1 Height (in): 66 Pulse (bpm): 79 Source: Stated Respiratory Rate (breaths/min): 16 Weight (lbs): 178 Blood Pressure (mmHg): 160/69 Source: Stated Reference Range: 80 - 120 mg / dl Body Mass Index (BMI): 28.7 Electronic Signature(s) Signed: 07/05/2019 2:25:48 PM By: Samantha Hurst RN, BSN Entered By: Samantha Bishop on 06/12/2019 13:58:42

## 2019-12-18 ENCOUNTER — Ambulatory Visit: Payer: Medicare Other | Admitting: Internal Medicine

## 2020-03-20 ENCOUNTER — Encounter: Payer: Self-pay | Admitting: Internal Medicine

## 2020-03-20 ENCOUNTER — Ambulatory Visit (INDEPENDENT_AMBULATORY_CARE_PROVIDER_SITE_OTHER): Payer: Medicare Other | Admitting: Internal Medicine

## 2020-03-20 ENCOUNTER — Other Ambulatory Visit: Payer: Self-pay

## 2020-03-20 VITALS — BP 132/72 | HR 81 | Temp 98.1°F | Ht 66.0 in | Wt 177.0 lb

## 2020-03-20 DIAGNOSIS — I10 Essential (primary) hypertension: Secondary | ICD-10-CM

## 2020-03-20 DIAGNOSIS — I4821 Permanent atrial fibrillation: Secondary | ICD-10-CM

## 2020-03-20 DIAGNOSIS — Z0000 Encounter for general adult medical examination without abnormal findings: Secondary | ICD-10-CM | POA: Diagnosis not present

## 2020-03-20 DIAGNOSIS — Z23 Encounter for immunization: Secondary | ICD-10-CM | POA: Diagnosis not present

## 2020-03-20 DIAGNOSIS — E782 Mixed hyperlipidemia: Secondary | ICD-10-CM

## 2020-03-20 LAB — LIPID PANEL
Cholesterol: 127 mg/dL (ref 0–200)
HDL: 51.4 mg/dL (ref >39.00)
LDL Cholesterol: 46 mg/dL (ref 0–99)
NonHDL: 75.12
Total CHOL/HDL Ratio: 2
Triglycerides: 144 mg/dL (ref 0.0–149.0)
VLDL: 28.8 mg/dL (ref 0.0–40.0)

## 2020-03-20 LAB — CBC
HCT: 37.1 % (ref 36.0–46.0)
Hemoglobin: 12.2 g/dL (ref 12.0–15.0)
MCHC: 32.9 g/dL (ref 30.0–36.0)
MCV: 87.6 fl (ref 78.0–100.0)
Platelets: 231 10*3/uL (ref 150.0–400.0)
RBC: 4.24 Mil/uL (ref 3.87–5.11)
RDW: 14.1 % (ref 11.5–15.5)
WBC: 6.3 10*3/uL (ref 4.0–10.5)

## 2020-03-20 LAB — COMPREHENSIVE METABOLIC PANEL
ALT: 11 U/L (ref 0–35)
AST: 15 U/L (ref 0–37)
Albumin: 4.4 g/dL (ref 3.5–5.2)
Alkaline Phosphatase: 53 U/L (ref 39–117)
BUN: 17 mg/dL (ref 6–23)
CO2: 33 mEq/L — ABNORMAL HIGH (ref 19–32)
Calcium: 9.8 mg/dL (ref 8.4–10.5)
Chloride: 102 mEq/L (ref 96–112)
Creatinine, Ser: 0.73 mg/dL (ref 0.40–1.20)
GFR: 71.11 mL/min (ref >60.00)
Glucose, Bld: 111 mg/dL — ABNORMAL HIGH (ref 70–99)
Potassium: 4 mEq/L (ref 3.5–5.1)
Sodium: 140 mEq/L (ref 135–145)
Total Bilirubin: 0.5 mg/dL (ref 0.2–1.2)
Total Protein: 7.4 g/dL (ref 6.0–8.3)

## 2020-03-20 NOTE — Progress Notes (Signed)
Subjective:   Patient ID: Samantha Bishop, female    DOB: 1925/04/24, 86 y.o.   MRN: [id number]  HPI Here for medicare wellness, no new complaints. Please see A/P for status and treatment of chronic medical problems.   Here for follow up cholesterol (taking pravastatin and denies missing doses, past stroke and no new stroke symptoms) and blood pressure (taknig hctz, denies chest pains or headaches, denies missing doses) and A fib (taking eliquis, no recent bleeding, prior hospitalization for nosebleeds)  Diet: heart healthy Physical activity: sedentary Depression/mood screen: negative Hearing: mild loss bilaterally Visual acuity: grossly normal, performs annual eye exam  ADLs: capable Fall risk: none Home safety: good Cognitive evaluation: intact to orientation, naming, recall and repetition EOL planning: adv directives discussed    Office Visit from 03/20/2020 in [facility] Total Score 0       I have personally reviewed and have noted 1. The patient's medical and social history - reviewed today no changes 2. Their use of alcohol, tobacco or illicit drugs 3. Their current medications and supplements 4. The patient's functional ability including ADL's, fall risks, home safety risks and hearing or visual impairment. 5. Diet and physical activities 6. Evidence for depression or mood disorders 7. Care team reviewed and updated  Patient Care Team: Hoyt Koch, MD as PCP - General (Internal Medicine) Past Medical History:  Diagnosis Date  . Anxiety state, unspecified   . Calculus of kidney   . Cerebral aneurysm, nonruptured   . Colon polyp   . Complication of anesthesia    "my heart stopped" 2004 knee  surg - see note on chart  . DEEP VENOUS THROMBOPHLEBITIS 08/15/2007   Qualifier: History of  By: Lenna Gilford MD, Deborra Medina   . Diverticulosis of colon (without mention of hemorrhage)   . History of skin cancer   . HTN (hypertension)   .  Hyperlipidemia   . Lumbago   . Osteoarthrosis, unspecified whether generalized or localized, unspecified site   . Permanent atrial fibrillation ([place])   . Pulmonary emboli ([place])    following remote knee arthroscopy surgery  . Ruptured lumbar disc   . Sleep apnea    stop bang score 4   . Unspecified hemorrhoids without mention of complication    Past Surgical History:  Procedure Laterality Date  . ABDOMINAL HYSTERECTOMY    . APPENDECTOMY    . BLOCKED INTESTINE SURGERY    . BREAST CYST EXCISION    . ESOPHAGOGASTRODUODENOSCOPY (EGD) WITH PROPOFOL N/A 05/30/2019   Procedure: ESOPHAGOGASTRODUODENOSCOPY (EGD) WITH PROPOFOL;  Surgeon: Thornton Park, MD;  Location: [place];  Service: Gastroenterology;  Laterality: N/A;  . HEMHORROIDECTOMY    . JOINT REPLACEMENT  2004   RT TOTAL KNEE  . KNEE ARTHROSCOPY     X2 L KNEE  . KNEE SURGERY    . NASAL SURGERY X2    . TONSILLECTOMY AND ADENOIDECTOMY    . TOTAL KNEE ARTHROPLASTY  09/03/2011   Procedure: TOTAL KNEE ARTHROPLASTY;  Surgeon: Gearlean Alf, MD;  Location: [facility];  Service: Orthopedics;  Laterality: Left;   Family History  Problem Relation Age of Onset  . Coronary artery disease Father   . Stroke Father   . Cirrhosis Brother   . Colon cancer Son   . Colon polyps Neg Hx   . Kidney disease Neg Hx   . Diabetes Neg Hx      Review of Systems  Constitutional: Negative.   HENT:  Negative.   Eyes: Negative.   Respiratory: Negative for cough, chest tightness and shortness of breath.   Cardiovascular: Negative for chest pain, palpitations and leg swelling.  Gastrointestinal: Negative for abdominal distention, abdominal pain, constipation, diarrhea, nausea and vomiting.  Musculoskeletal: Negative.   Skin: Negative.   Neurological: Negative.   Psychiatric/Behavioral: Negative.     Objective:  Physical Exam Constitutional:      Appearance: She is well-developed.  HENT:     Head: Normocephalic and atraumatic.    Cardiovascular:     Rate and Rhythm: Normal rate and regular rhythm.  Pulmonary:     Effort: Pulmonary effort is normal. No respiratory distress.     Breath sounds: Normal breath sounds. No wheezing or rales.  Abdominal:     General: Bowel sounds are normal. There is no distension.     Palpations: Abdomen is soft.     Tenderness: There is no abdominal tenderness. There is no rebound.  Musculoskeletal:     Cervical back: Normal range of motion.  Skin:    General: Skin is warm and dry.  Neurological:     Mental Status: She is alert and oriented to person, place, and time.     Coordination: Coordination normal.     Vitals:   03/20/20 1348  BP: 132/72  Pulse: 81  Temp: 98.1 F (36.7 C)  TempSrc: Oral  SpO2: 96%  Weight: 177 lb (80.3 kg)  Height: 5\' 6"  (1.676 m)    This visit occurred during the SARS-CoV-2 public health emergency.  Safety protocols were in place, including screening questions prior to the visit, additional usage of staff PPE, and extensive cleaning of exam room while observing appropriate contact time as indicated for disinfecting solutions.   Assessment & Plan:  Flu shot given at visit

## 2020-03-20 NOTE — Patient Instructions (Signed)
We are checking the blood work and have given you the flu shot.

## 2020-03-21 NOTE — Assessment & Plan Note (Signed)
Flu shot given at visit. Covid-19 up to date and booster recommended. Pneumonia complete. Shingrix counseled. Tetanus declines. Colonoscopy aged out. Mammogram aged out, pap smear aged out and dexa declines further. Counseled about sun safety and mole surveillance. Counseled about the dangers of distracted driving. Given 10 year screening recommendations.

## 2020-03-21 NOTE — Assessment & Plan Note (Signed)
BP at goal on hctz. Needs CMP today and adjust as needed.

## 2020-03-21 NOTE — Assessment & Plan Note (Signed)
Checking lipid panel and adjust statin as needed.

## 2020-03-21 NOTE — Assessment & Plan Note (Signed)
On eliquis for anticoagulation and needs CBC today which was ordered.

## 2020-03-24 DIAGNOSIS — Z23 Encounter for immunization: Secondary | ICD-10-CM | POA: Diagnosis not present

## 2020-04-11 ENCOUNTER — Telehealth: Payer: Self-pay | Admitting: *Deleted

## 2020-04-11 NOTE — Telephone Encounter (Signed)
Left message to call back to re-establish with cardiology for At Fib - per [facility] request.

## 2020-04-14 NOTE — Telephone Encounter (Signed)
Called again and left message for pt or son, [name], to call back to schedule an appt to establish for follow up of At Fib.

## 2020-04-14 NOTE — Telephone Encounter (Signed)
Pt has been scheduled with Dr Marlou Porch 05/13/2020 at 2 pm.

## 2020-05-13 ENCOUNTER — Encounter: Payer: Self-pay | Admitting: Cardiology

## 2020-05-13 ENCOUNTER — Ambulatory Visit (INDEPENDENT_AMBULATORY_CARE_PROVIDER_SITE_OTHER): Payer: Medicare Other | Admitting: Cardiology

## 2020-05-13 ENCOUNTER — Other Ambulatory Visit: Payer: Self-pay

## 2020-05-13 VITALS — BP 160/90 | HR 89 | Ht 65.5 in | Wt 177.2 lb

## 2020-05-13 DIAGNOSIS — I10 Essential (primary) hypertension: Secondary | ICD-10-CM | POA: Diagnosis not present

## 2020-05-13 DIAGNOSIS — Z7901 Long term (current) use of anticoagulants: Secondary | ICD-10-CM

## 2020-05-13 DIAGNOSIS — I4811 Longstanding persistent atrial fibrillation: Secondary | ICD-10-CM | POA: Diagnosis not present

## 2020-05-13 NOTE — Patient Instructions (Signed)

## 2020-05-13 NOTE — Progress Notes (Signed)
Cardiology Office Note:    Date:  05/13/2020   ID:  Samantha Bishop, DOB 15-Aug-1924, MRN [id number]  PCP:  Samantha Broker, MD  [facility] Cardiologist:  No primary care provider on file.  [facility] Electrophysiologist:  None   Referring MD: Samantha Bishop, *    History of Present Illness:    Samantha Bishop is a 87 y.o. female here for the evaluation of atrial fibrillation at the request of Dr. Hillard Danker. She is a new patient to general cardiology.  CHADSVASc 6. Eliquis.  At 1 point was on Xarelto while she had the stroke  Hypertension, alopecia-therefore not on beta-blockers.  Stroke in February 2018 with right homonymous hemianopsia with a left PCA infarct. Small aneurysmal left ICA.  She has had falls in the past. Has had nosebleeds in the past. Hospitalized on 05/29/2019 for this.  Had upper endoscopy on 05/30/2019 by Dr. Hal Neer. No source of bleeding.  Overall been doing quite well.  No recent falls.  Bowel issues discussed below.  Her daughter lives in [place].  She states that her daughter's husband is retired Teacher, English as a foreign language of [employer].  Past Medical History:  Diagnosis Date  . Anxiety state, unspecified   . Calculus of kidney   . Cerebral aneurysm, nonruptured   . Colon polyp   . Complication of anesthesia    "my heart stopped" 2004 knee  surg - see note on chart  . DEEP VENOUS THROMBOPHLEBITIS 08/15/2007   Qualifier: History of  By: Kriste Basque MD, Lonzo Cloud   . Diverticulosis of colon (without mention of hemorrhage)   . History of skin cancer   . HTN (hypertension)   . Hyperlipidemia   . Lumbago   . Osteoarthrosis, unspecified whether generalized or localized, unspecified site   . Permanent atrial fibrillation (HCC)   . Pulmonary emboli (HCC)    following remote knee arthroscopy surgery  . Ruptured lumbar disc   . Sleep apnea    stop bang score 4   . Unspecified hemorrhoids without mention of  complication     Past Surgical History:  Procedure Laterality Date  . ABDOMINAL HYSTERECTOMY    . APPENDECTOMY    . BLOCKED INTESTINE SURGERY    . BREAST CYST EXCISION    . ESOPHAGOGASTRODUODENOSCOPY (EGD) WITH PROPOFOL N/A 05/30/2019   Procedure: ESOPHAGOGASTRODUODENOSCOPY (EGD) WITH PROPOFOL;  Surgeon: Tressia Danas, MD;  Location: [facility] ENDOSCOPY;  Service: Gastroenterology;  Laterality: N/A;  . HEMHORROIDECTOMY    . JOINT REPLACEMENT  2004   RT TOTAL KNEE  . KNEE ARTHROSCOPY     X2 L KNEE  . KNEE SURGERY    . NASAL SURGERY X2    . TONSILLECTOMY AND ADENOIDECTOMY    . TOTAL KNEE ARTHROPLASTY  09/03/2011   Procedure: TOTAL KNEE ARTHROPLASTY;  Surgeon: Loanne Drilling, MD;  Location: [facility] ORS;  Service: Orthopedics;  Laterality: Left;    Current Medications: Current Meds  Medication Sig  . apixaban (ELIQUIS) 5 MG TABS tablet TAKE 1 TABLET (5 MG TOTAL) BY MOUTH 2 (TWO) TIMES DAILY.  . fluticasone (FLONASE) 50 MCG/ACT nasal spray Place 2 sprays into both nostrils daily.  Marland Kitchen gabapentin (NEURONTIN) 300 MG capsule Take 2 capsules (600 mg total) by mouth 3 (three) times daily.  . hydrochlorothiazide (HYDRODIURIL) 25 MG tablet Take 0.5 tablets (12.5 mg total) by mouth daily.  . NON FORMULARY Diet order: Regular Diet.  . pravastatin (PRAVACHOL) 20 MG tablet Take 1 tablet (20 mg total) by  mouth daily at 6 PM.  . sodium chloride (OCEAN) 0.65 % SOLN nasal spray Place 1 spray into both nostrils as needed for congestion.     Allergies:   Codeine and Penicillins   Social History   Socioeconomic History  . Marital status: Single    Spouse name: Not on file  . Number of children: 3  . Years of education: Not on file  . Highest education level: Not on file  Occupational History  . Occupation: Retired  Tobacco Use  . Smoking status: Never Smoker  . Smokeless tobacco: Never Used  Substance and Sexual Activity  . Alcohol use: Yes    Alcohol/week: 0.0 standard drinks    Comment: Rarely   . Drug use: No  . Sexual activity: Not Currently  Other Topics Concern  . Not on file  Social History Narrative  . Not on file   Social Determinants of Health   Financial Resource Strain: Not on file  Food Insecurity: Not on file  Transportation Needs: Not on file  Physical Activity: Not on file  Stress: Not on file  Social Connections: Not on file     Family History: The patient's family history includes Cirrhosis in her brother; Colon cancer in her son; Coronary artery disease in her father; Stroke in her father. There is no history of Colon polyps, Kidney disease, or Diabetes.  ROS:   Please see the history of present illness.    No fevers chills nausea vomiting.  All other systems reviewed and are negative.  EKGs/Labs/Other Studies Reviewed:    The following studies were reviewed today:  Echocardiogram 06/2016-EF 65%-severe left atrial enlargement   EKG:  EKG is  ordered today.  The ekg ordered today demonstrates atrial fibrillation 89 bpm no change from prior  Recent Labs: 03/20/2020: ALT 11; BUN 17; Creatinine, Ser 0.73; Hemoglobin 12.2; Platelets 231.0; Potassium 4.0; Sodium 140  Recent Lipid Panel    Component Value Date/Time   CHOL 127 03/20/2020 1445   TRIG 144.0 03/20/2020 1445   HDL 51.40 03/20/2020 1445   CHOLHDL 2 03/20/2020 1445   VLDL 28.8 03/20/2020 1445   LDLCALC 46 03/20/2020 1445     Risk Assessment/Calculations:     CHA2DS2-VASc Score = 6  This indicates a 9.7% annual risk of stroke. The patient's score is based upon: CHF History: No HTN History: Yes Diabetes History: No Stroke History: Yes Vascular Disease History: No Age Score: 2 Gender Score: 1      Physical Exam:    VS:  BP (!) 160/90   Pulse 89   Ht 5' 5.5" (1.664 m)   Wt 177 lb 3.2 oz (80.4 kg)   SpO2 92%   BMI 29.04 kg/m     Wt Readings from Last 3 Encounters:  05/13/20 177 lb 3.2 oz (80.4 kg)  03/20/20 177 lb (80.3 kg)  07/24/19 176 lb 8 oz (80.1 kg)     GEN:   Well nourished, well developed in no acute distress HEENT: Normal NECK: No JVD; No carotid bruits LYMPHATICS: No lymphadenopathy CARDIAC: irreg no murmurs, rubs, gallops RESPIRATORY:  Clear to auscultation without rales, wheezing or rhonchi  ABDOMEN: Soft, non-tender, non-distended MUSCULOSKELETAL:  No edema; No deformity  SKIN: Warm and dry NEUROLOGIC:  Alert and oriented x 3 PSYCHIATRIC:  Normal affect   ASSESSMENT:    1. Longstanding persistent atrial fibrillation (HCC)   2. Essential hypertension   3. Chronic anticoagulation    PLAN:    In order of problems  listed above:  Longstanding persistent atrial fibrillation -Overall doing well, rate controlled. Continue with medication management.  Chronic anticoagulation -On Eliquis. No bleeding. Continue with monitoring of CBC as well as basic metabolic profile. Recent labs from outside showed creatinine of 0.73, hemoglobin of 12.2  Prior falls -Be careful especially on anticoagulation.  Change in bowel habits -She states that she wakes up at 3:57 AM now and has to have a bowel movement.  Occasionally she will notice some light blood on her stool.  Not having it currently.  She has had an endoscopy, upper, by Dr. Tarri Glenn.  I asked her to discuss this with Dr. Sharlet Salina.  She has not had any weight loss.  May be reasonable to continue with watchful waiting.      Medication Adjustments/Labs and Tests Ordered: Current medicines are reviewed at length with the patient today.  Concerns regarding medicines are outlined above.  Orders Placed This Encounter  Procedures  . EKG 12-Lead   No orders of the defined types were placed in this encounter.   Patient Instructions  Medication Instructions:  The current medical regimen is effective;  continue present plan and medications.  *If you need a refill on your cardiac medications before your next appointment, please call your pharmacy*  Follow-Up: At [facility], you and your  health needs are our priority.  As part of our continuing mission to provide you with exceptional heart care, we have created designated Provider Care Teams.  These Care Teams include your primary Cardiologist (physician) and Advanced Practice Providers (APPs -  Physician Assistants and Nurse Practitioners) who all work together to provide you with the care you need, when you need it.  We recommend signing up for the patient portal called "MyChart".  Sign up information is provided on this After Visit Summary.  MyChart is used to connect with patients for Virtual Visits (Telemedicine).  Patients are able to view lab/test results, encounter notes, upcoming appointments, etc.  Non-urgent messages can be sent to your provider as well.   To learn more about what you can do with MyChart, go to [email or web address].    Your next appointment:   6 month(s)  The format for your next appointment:   In Person  Provider:   Candee Furbish, MD   Thank you for choosing [facility]!!        Signed, Candee Furbish, MD  05/13/2020 3:40 PM    [place]

## 2020-05-19 ENCOUNTER — Telehealth: Payer: Self-pay | Admitting: Internal Medicine

## 2020-05-19 NOTE — Telephone Encounter (Signed)
   Patient requesting refill for [place], [place] [facility] [address]

## 2020-05-20 MED ORDER — APIXABAN 5 MG PO TABS: ORAL_TABLET | ORAL | 3 refills | Status: DC

## 2020-05-20 NOTE — Telephone Encounter (Signed)
Refill sent. See meds.  

## 2020-06-21 ENCOUNTER — Emergency Department (HOSPITAL_COMMUNITY): Payer: Medicare Other

## 2020-06-21 ENCOUNTER — Inpatient Hospital Stay (HOSPITAL_COMMUNITY)
Admission: EM | Admit: 2020-06-21 | Discharge: 2020-06-30 | DRG: 552 | Disposition: A | Discharge status: Skilled Nursing Facility | Payer: Medicare Other | Attending: Internal Medicine | Admitting: Internal Medicine

## 2020-06-21 ENCOUNTER — Encounter (HOSPITAL_COMMUNITY): Payer: Self-pay | Admitting: Emergency Medicine

## 2020-06-21 ENCOUNTER — Other Ambulatory Visit: Payer: Self-pay

## 2020-06-21 DIAGNOSIS — Z885 Allergy status to narcotic agent status: Secondary | ICD-10-CM

## 2020-06-21 DIAGNOSIS — Z9181 History of falling: Secondary | ICD-10-CM

## 2020-06-21 DIAGNOSIS — Z8673 Personal history of transient ischemic attack (TIA), and cerebral infarction without residual deficits: Secondary | ICD-10-CM

## 2020-06-21 DIAGNOSIS — K219 Gastro-esophageal reflux disease without esophagitis: Secondary | ICD-10-CM | POA: Diagnosis present

## 2020-06-21 DIAGNOSIS — S12110A Anterior displaced Type II dens fracture, initial encounter for closed fracture: Secondary | ICD-10-CM | POA: Diagnosis not present

## 2020-06-21 DIAGNOSIS — R52 Pain, unspecified: Secondary | ICD-10-CM

## 2020-06-21 DIAGNOSIS — W010XXA Fall on same level from slipping, tripping and stumbling without subsequent striking against object, initial encounter: Secondary | ICD-10-CM | POA: Diagnosis present

## 2020-06-21 DIAGNOSIS — Z7901 Long term (current) use of anticoagulants: Secondary | ICD-10-CM

## 2020-06-21 DIAGNOSIS — I517 Cardiomegaly: Secondary | ICD-10-CM | POA: Diagnosis not present

## 2020-06-21 DIAGNOSIS — S12112A Nondisplaced Type II dens fracture, initial encounter for closed fracture: Secondary | ICD-10-CM

## 2020-06-21 DIAGNOSIS — R6889 Other general symptoms and signs: Secondary | ICD-10-CM | POA: Diagnosis not present

## 2020-06-21 DIAGNOSIS — E559 Vitamin D deficiency, unspecified: Secondary | ICD-10-CM | POA: Diagnosis present

## 2020-06-21 DIAGNOSIS — Z20822 Contact with and (suspected) exposure to covid-19: Secondary | ICD-10-CM | POA: Diagnosis not present

## 2020-06-21 DIAGNOSIS — S12090A Other displaced fracture of first cervical vertebra, initial encounter for closed fracture: Principal | ICD-10-CM | POA: Diagnosis present

## 2020-06-21 DIAGNOSIS — Z743 Need for continuous supervision: Secondary | ICD-10-CM | POA: Diagnosis not present

## 2020-06-21 DIAGNOSIS — Z79899 Other long term (current) drug therapy: Secondary | ICD-10-CM

## 2020-06-21 DIAGNOSIS — Z823 Family history of stroke: Secondary | ICD-10-CM

## 2020-06-21 DIAGNOSIS — E785 Hyperlipidemia, unspecified: Secondary | ICD-10-CM | POA: Diagnosis present

## 2020-06-21 DIAGNOSIS — I119 Hypertensive heart disease without heart failure: Secondary | ICD-10-CM | POA: Diagnosis present

## 2020-06-21 DIAGNOSIS — Z90710 Acquired absence of both cervix and uterus: Secondary | ICD-10-CM

## 2020-06-21 DIAGNOSIS — S12101A Unspecified nondisplaced fracture of second cervical vertebra, initial encounter for closed fracture: Secondary | ICD-10-CM | POA: Diagnosis not present

## 2020-06-21 DIAGNOSIS — I671 Cerebral aneurysm, nonruptured: Secondary | ICD-10-CM | POA: Diagnosis present

## 2020-06-21 DIAGNOSIS — Z85828 Personal history of other malignant neoplasm of skin: Secondary | ICD-10-CM

## 2020-06-21 DIAGNOSIS — I4821 Permanent atrial fibrillation: Secondary | ICD-10-CM | POA: Diagnosis present

## 2020-06-21 DIAGNOSIS — I10 Essential (primary) hypertension: Secondary | ICD-10-CM | POA: Diagnosis not present

## 2020-06-21 DIAGNOSIS — M47812 Spondylosis without myelopathy or radiculopathy, cervical region: Secondary | ICD-10-CM | POA: Diagnosis not present

## 2020-06-21 DIAGNOSIS — I872 Venous insufficiency (chronic) (peripheral): Secondary | ICD-10-CM | POA: Diagnosis present

## 2020-06-21 DIAGNOSIS — S0990XA Unspecified injury of head, initial encounter: Secondary | ICD-10-CM | POA: Diagnosis not present

## 2020-06-21 DIAGNOSIS — S199XXA Unspecified injury of neck, initial encounter: Secondary | ICD-10-CM | POA: Diagnosis not present

## 2020-06-21 DIAGNOSIS — M542 Cervicalgia: Secondary | ICD-10-CM | POA: Diagnosis not present

## 2020-06-21 DIAGNOSIS — I672 Cerebral atherosclerosis: Secondary | ICD-10-CM | POA: Diagnosis not present

## 2020-06-21 DIAGNOSIS — W19XXXA Unspecified fall, initial encounter: Secondary | ICD-10-CM

## 2020-06-21 DIAGNOSIS — S129XXA Fracture of neck, unspecified, initial encounter: Secondary | ICD-10-CM | POA: Diagnosis present

## 2020-06-21 DIAGNOSIS — W1830XA Fall on same level, unspecified, initial encounter: Secondary | ICD-10-CM | POA: Diagnosis not present

## 2020-06-21 DIAGNOSIS — E876 Hypokalemia: Secondary | ICD-10-CM

## 2020-06-21 DIAGNOSIS — K5900 Constipation, unspecified: Secondary | ICD-10-CM | POA: Diagnosis not present

## 2020-06-21 DIAGNOSIS — R0689 Other abnormalities of breathing: Secondary | ICD-10-CM | POA: Diagnosis not present

## 2020-06-21 DIAGNOSIS — I4891 Unspecified atrial fibrillation: Secondary | ICD-10-CM | POA: Diagnosis present

## 2020-06-21 DIAGNOSIS — Z79891 Long term (current) use of opiate analgesic: Secondary | ICD-10-CM

## 2020-06-21 DIAGNOSIS — I6782 Cerebral ischemia: Secondary | ICD-10-CM | POA: Diagnosis not present

## 2020-06-21 DIAGNOSIS — G4730 Sleep apnea, unspecified: Secondary | ICD-10-CM | POA: Diagnosis present

## 2020-06-21 DIAGNOSIS — Z880 Allergy status to penicillin: Secondary | ICD-10-CM

## 2020-06-21 DIAGNOSIS — I639 Cerebral infarction, unspecified: Secondary | ICD-10-CM | POA: Diagnosis present

## 2020-06-21 DIAGNOSIS — S12091A Other nondisplaced fracture of first cervical vertebra, initial encounter for closed fracture: Secondary | ICD-10-CM | POA: Diagnosis not present

## 2020-06-21 DIAGNOSIS — Z96653 Presence of artificial knee joint, bilateral: Secondary | ICD-10-CM | POA: Diagnosis present

## 2020-06-21 DIAGNOSIS — Z86711 Personal history of pulmonary embolism: Secondary | ICD-10-CM

## 2020-06-21 DIAGNOSIS — Z981 Arthrodesis status: Secondary | ICD-10-CM | POA: Diagnosis not present

## 2020-06-21 DIAGNOSIS — Z8672 Personal history of thrombophlebitis: Secondary | ICD-10-CM

## 2020-06-21 DIAGNOSIS — F411 Generalized anxiety disorder: Secondary | ICD-10-CM | POA: Diagnosis present

## 2020-06-21 LAB — CBC WITH DIFFERENTIAL/PLATELET
Abs Immature Granulocytes: 0.04 10*3/uL (ref 0.00–0.07)
Basophils Absolute: 0.1 10*3/uL (ref 0.0–0.1)
Basophils Relative: 1 %
Eosinophils Absolute: 0 10*3/uL (ref 0.0–0.5)
Eosinophils Relative: 0 %
HCT: 37.1 % (ref 36.0–46.0)
Hemoglobin: 11.7 g/dL — ABNORMAL LOW (ref 12.0–15.0)
Immature Granulocytes: 0 %
Lymphocytes Relative: 19 %
Lymphs Abs: 1.8 10*3/uL (ref 0.7–4.0)
MCH: 28.7 pg (ref 26.0–34.0)
MCHC: 31.5 g/dL (ref 30.0–36.0)
MCV: 91.2 fL (ref 80.0–100.0)
Monocytes Absolute: 0.8 10*3/uL (ref 0.1–1.0)
Monocytes Relative: 9 %
Neutro Abs: 6.9 10*3/uL (ref 1.7–7.7)
Neutrophils Relative %: 71 %
Platelets: 201 10*3/uL (ref 150–400)
RBC: 4.07 MIL/uL (ref 3.87–5.11)
RDW: 13.5 % (ref 11.5–15.5)
WBC: 9.7 10*3/uL (ref 4.0–10.5)
nRBC: 0 % (ref 0.0–0.2)

## 2020-06-21 LAB — PROTIME-INR
INR: 1.1 (ref 0.8–1.2)
Prothrombin Time: 14.2 seconds (ref 11.4–15.2)

## 2020-06-21 LAB — BASIC METABOLIC PANEL
Anion gap: 13 (ref 5–15)
BUN: 11 mg/dL (ref 8–23)
CO2: 22 mmol/L (ref 22–32)
Calcium: 9.2 mg/dL (ref 8.9–10.3)
Chloride: 103 mmol/L (ref 98–111)
Creatinine, Ser: 0.55 mg/dL (ref 0.44–1.00)
GFR, Estimated: >60 mL/min (ref >60)
Glucose, Bld: 122 mg/dL — ABNORMAL HIGH (ref 70–99)
Potassium: 3.3 mmol/L — ABNORMAL LOW (ref 3.5–5.1)
Sodium: 138 mmol/L (ref 135–145)

## 2020-06-21 LAB — RESP PANEL BY RT-PCR (FLU A&B, COVID) ARPGX2
Influenza A by PCR: NEGATIVE
Influenza B by PCR: NEGATIVE
SARS Coronavirus 2 by RT PCR: NEGATIVE

## 2020-06-21 MED ORDER — LABETALOL HCL 5 MG/ML IV SOLN
5.0000 mg | INTRAVENOUS | Status: DC | PRN
  Administered 2020-06-22 – 2020-06-23 (×2): 5 mg via INTRAVENOUS
  Filled 2020-06-21 (×2): qty 4

## 2020-06-21 MED ORDER — SENNA 8.6 MG PO TABS
1.0000 | ORAL_TABLET | Freq: Two times a day (BID) | ORAL | Status: DC
  Administered 2020-06-22 – 2020-06-28 (×14): 8.6 mg via ORAL
  Filled 2020-06-21 (×13): qty 1

## 2020-06-21 MED ORDER — POTASSIUM CHLORIDE CRYS ER 20 MEQ PO TBCR
20.0000 meq | EXTENDED_RELEASE_TABLET | Freq: Once | ORAL | Status: AC
  Administered 2020-06-21: 20 meq via ORAL
  Filled 2020-06-21: qty 1

## 2020-06-21 MED ORDER — ACETAMINOPHEN 500 MG PO TABS
1000.0000 mg | ORAL_TABLET | Freq: Once | ORAL | Status: AC
  Administered 2020-06-21: 1000 mg via ORAL
  Filled 2020-06-21: qty 2

## 2020-06-21 MED ORDER — GABAPENTIN 300 MG PO CAPS
600.0000 mg | ORAL_CAPSULE | Freq: Three times a day (TID) | ORAL | Status: DC
  Administered 2020-06-21 – 2020-06-30 (×27): 600 mg via ORAL
  Filled 2020-06-21 (×26): qty 2

## 2020-06-21 MED ORDER — FENTANYL CITRATE (PF) 100 MCG/2ML IJ SOLN
INTRAMUSCULAR | Status: AC
  Filled 2020-06-21: qty 2

## 2020-06-21 MED ORDER — ONDANSETRON HCL 4 MG/2ML IJ SOLN: 4.0000 mg | Freq: Four times a day (QID) | INTRAMUSCULAR | Status: DC | PRN

## 2020-06-21 MED ORDER — OXYCODONE HCL 5 MG PO TABS
5.0000 mg | ORAL_TABLET | ORAL | Status: DC | PRN
Start: 2020-06-21 — End: 2020-06-26
  Administered 2020-06-21 – 2020-06-26 (×14): 5 mg via ORAL
  Filled 2020-06-21 (×14): qty 1

## 2020-06-21 MED ORDER — OXYCODONE HCL 5 MG PO TABS: 5.0000 mg | ORAL_TABLET | ORAL | Status: DC | PRN

## 2020-06-21 MED ORDER — SALINE SPRAY 0.65 % NA SOLN: 1.0000 | Freq: Two times a day (BID) | NASAL | Status: DC | PRN

## 2020-06-21 MED ORDER — FENTANYL CITRATE (PF) 100 MCG/2ML IJ SOLN
INTRAMUSCULAR | Status: AC | PRN
  Administered 2020-06-21 (×2): 50 ug via INTRAVENOUS

## 2020-06-21 MED ORDER — ACETAMINOPHEN 650 MG RE SUPP: 650.0000 mg | Freq: Four times a day (QID) | RECTAL | Status: DC | PRN

## 2020-06-21 MED ORDER — POLYETHYLENE GLYCOL 3350 17 G PO PACK: 17.0000 g | PACK | Freq: Every day | ORAL | Status: DC | PRN

## 2020-06-21 MED ORDER — ONDANSETRON HCL 4 MG PO TABS: 4.0000 mg | ORAL_TABLET | Freq: Four times a day (QID) | ORAL | Status: DC | PRN

## 2020-06-21 MED ORDER — ACETAMINOPHEN 325 MG PO TABS
650.0000 mg | ORAL_TABLET | Freq: Four times a day (QID) | ORAL | Status: DC | PRN
  Administered 2020-06-22 – 2020-06-30 (×7): 650 mg via ORAL
  Filled 2020-06-21 (×8): qty 2

## 2020-06-21 MED ORDER — FLUTICASONE PROPIONATE 50 MCG/ACT NA SUSP
2.0000 | Freq: Every day | NASAL | Status: DC
  Administered 2020-06-22 – 2020-06-30 (×10): 2 via NASAL
  Filled 2020-06-21: qty 16

## 2020-06-21 MED ORDER — MORPHINE SULFATE (PF) 2 MG/ML IV SOLN
1.0000 mg | INTRAVENOUS | Status: DC | PRN
  Administered 2020-06-22: 1 mg via INTRAVENOUS
  Filled 2020-06-21 (×2): qty 1

## 2020-06-21 MED ORDER — PRAVASTATIN SODIUM 40 MG PO TABS
20.0000 mg | ORAL_TABLET | Freq: Every day | ORAL | Status: DC
  Administered 2020-06-21 – 2020-06-29 (×9): 20 mg via ORAL
  Filled 2020-06-21 (×9): qty 1

## 2020-06-21 NOTE — Progress Notes (Signed)
Orthopedic Tech Progress Note Patient Details:  Samantha Bishop 01/16/1925 [id number] Level 2 trauma  Patient ID: Samantha Bishop, female   DOB: 1924/09/07, 87 y.o.   MRN: [id number]   [name] 06/21/2020, 7:04 PM

## 2020-06-21 NOTE — ED Triage Notes (Signed)
Level 2 trauma fall on blood thinner hit back of the head.

## 2020-06-21 NOTE — Consult Note (Signed)
Chief Complaint   Chief Complaint  Patient presents with  . Fall    HPI   Consult requested by: Dr Lanny Hurst, [facility] Reason for consult: C1, C2 fracture  HPI: Samantha Bishop is a 87 y.o. female with multiple medical comorbidities who presented to the ED after mechanical fall at home today. She was out raking leaves when she tripped over bricks. [place] [address] and struck back of head. No LOC. Needed assistance getting up, but able to move all extremities. She underwent work up by EDP and was found to have C1 anterior ring fracture as well as type II odontoid fracture. A NSY consultation was requested. She complains of severe neck pain. No associated N/T/W in extremities. Has not urinated since being in the ED, but does have the sensation to go now.  Patient Active Problem List   Diagnosis Date Noted  . Cervical spine fracture ([place]) 06/21/2020  . Intractable pain 06/21/2020  . History of CVA (cerebrovascular accident) 06/09/2019  . Epistaxis 05/30/2019  . Gastritis and gastroduodenitis   . Bleeding 05/29/2019  . Venous stasis ulcer limited to breakdown of skin without varicose veins ([place]) 05/17/2019  . Ulcer of right lower extremity, limited to breakdown of skin ([place]) 05/17/2019  . Acute post-traumatic headache, not intractable 12/21/2018  . Hot flashes 02/09/2017  . Adjustment disorder with mixed anxiety and depressed mood 06/28/2016  . CVA (cerebral vascular accident) ([place]) 06/26/2016  . Right homonymous hemianopsia 06/26/2016  . Back pain 04/08/2015  . Abnormal CT scan 04/08/2015  . Intracranial aneurysm 10/18/2014  . Routine general medical examination at a health care facility 03/08/2014  . Occipital neuralgia 07/16/2013  . Venous insufficiency 07/11/2013  . Alopecia 06/08/2012  . Osteopenia 02/21/2010  . ANXIETY 05/21/2009  . Essential hypertension 05/21/2009  . Atrial fibrillation - followed by Dr. Caryl Comes 03/03/2009  . INTRACRANIAL ANEURYSM 08/16/2007  . Hyperlipidemia 08/15/2007   . GERD 08/15/2007    PMH: Past Medical History:  Diagnosis Date  . Anxiety state, unspecified   . Calculus of kidney   . Cerebral aneurysm, nonruptured   . Colon polyp   . Complication of anesthesia    "my heart stopped" 2004 knee  surg - see note on chart  . DEEP VENOUS THROMBOPHLEBITIS 08/15/2007   Qualifier: History of  By: Lenna Gilford MD, Deborra Medina   . Diverticulosis of colon (without mention of hemorrhage)   . History of skin cancer   . HTN (hypertension)   . Hyperlipidemia   . Lumbago   . Osteoarthrosis, unspecified whether generalized or localized, unspecified site   . Permanent atrial fibrillation ([place])   . Pulmonary emboli ([place])    following remote knee arthroscopy surgery  . Ruptured lumbar disc   . Sleep apnea    stop bang score 4   . Unspecified hemorrhoids without mention of complication     PSH: Past Surgical History:  Procedure Laterality Date  . ABDOMINAL HYSTERECTOMY    . APPENDECTOMY    . BLOCKED INTESTINE SURGERY    . BREAST CYST EXCISION    . ESOPHAGOGASTRODUODENOSCOPY (EGD) WITH PROPOFOL N/A 05/30/2019   Procedure: ESOPHAGOGASTRODUODENOSCOPY (EGD) WITH PROPOFOL;  Surgeon: Thornton Park, MD;  Location: [facility];  Service: Gastroenterology;  Laterality: N/A;  . HEMHORROIDECTOMY    . JOINT REPLACEMENT  2004   RT TOTAL KNEE  . KNEE ARTHROSCOPY     X2 L KNEE  . KNEE SURGERY    . NASAL SURGERY X2    . TONSILLECTOMY AND ADENOIDECTOMY    .  TOTAL KNEE ARTHROPLASTY  09/03/2011   Procedure: TOTAL KNEE ARTHROPLASTY;  Surgeon: Gearlean Alf, MD;  Location: [facility];  Service: Orthopedics;  Laterality: Left;    (Not in a hospital admission)   SH: Social History   Tobacco Use  . Smoking status: Never Smoker  . Smokeless tobacco: Never Used  Substance Use Topics  . Alcohol use: Yes    Alcohol/week: 0.0 standard drinks    Comment: Rarely  . Drug use: No    MEDS: Prior to Admission medications   Medication Sig Start Date End Date Taking?  Authorizing Provider  apixaban (ELIQUIS) 5 MG TABS tablet TAKE 1 TABLET (5 MG TOTAL) BY MOUTH 2 (TWO) TIMES DAILY. Patient taking differently: Take 5 mg by mouth 2 (two) times daily. 05/20/20  Yes Hoyt Koch, MD  fluticasone Asencion Islam) 50 MCG/ACT nasal spray Place 2 sprays into both nostrils daily. Patient taking differently: Place 2 sprays into both nostrils 2 (two) times daily. 06/29/19  Yes Lassen, Arlo C, PA-C  gabapentin (NEURONTIN) 300 MG capsule Take 2 capsules (600 mg total) by mouth 3 (three) times daily. 07/24/19  Yes Hoyt Koch, MD  hydrochlorothiazide (HYDRODIURIL) 25 MG tablet Take 0.5 tablets (12.5 mg total) by mouth daily. 07/24/19  Yes Hoyt Koch, MD  Multiple Vitamin (MULTIVITAMIN WITH MINERALS) TABS tablet Take 1 tablet by mouth daily.   Yes [provider]  pravastatin (PRAVACHOL) 20 MG tablet Take 1 tablet (20 mg total) by mouth daily at 6 PM. 07/24/19  Yes Hoyt Koch, MD  sodium chloride (OCEAN) 0.65 % SOLN nasal spray Place 1 spray into both nostrils as needed for congestion. Patient taking differently: Place 1 spray into both nostrils 2 (two) times daily as needed for congestion. 07/24/19  Yes Hoyt Koch, MD  NON FORMULARY Diet order: Regular Diet.    [provider]    ALLERGY: Allergies  Allergen Reactions  . Codeine Nausea Only  . Penicillins Rash    Social History   Tobacco Use  . Smoking status: Never Smoker  . Smokeless tobacco: Never Used  Substance Use Topics  . Alcohol use: Yes    Alcohol/week: 0.0 standard drinks    Comment: Rarely     Family History  Problem Relation Age of Onset  . Coronary artery disease Father   . Stroke Father   . Cirrhosis Brother   . Colon cancer Son   . Colon polyps Neg Hx   . Kidney disease Neg Hx   . Diabetes Neg Hx      ROS   Review of Systems  All other systems reviewed and are negative.   Exam   Vitals:   06/21/20 1915 06/21/20 1930  BP:  (!) 182/84 (!) 165/101  Pulse: 100 89  Resp: 15 15  Temp:    SpO2: 93% 92%   General appearance: elderly female, resting comfortably, in aspen c collar Eyes: No scleral injection Cardiovascular: Regular rate and rhythm without murmurs, rubs, gallops. No edema or variciosities. Distal pulses normal. Pulmonary: Effort normal, non-labored breathing Musculoskeletal:     Muscle tone upper extremities: Normal    Muscle tone lower extremities: Normal    Motor exam: Upper Extremities Deltoid Bicep Tricep Grip  Right 5/5 5/5 5/5 5/5  Left 5/5 5/5 5/5 5/5   Lower Extremity IP Quad PF DF EHL  Right 5/5 5/5 5/5 5/5 5/5  Left 5/5 5/5 5/5 5/5 5/5   Neurological Mental Status:    - Patient  is awake, alert, oriented to person, place, month, year, and situation    - Patient is able to give a clear and coherent history.    - No signs of aphasia or neglect Cranial Nerves    - II: Visual Fields are full. PERRL    - III/IV/VI: EOMI without ptosis or diploplia.     - V: Facial sensation is grossly normal    - VII: Facial movement is symmetric.     - VIII: hearing is intact to voice    - X: Uvula elevates symmetrically    - XI: Shoulder shrug is symmetric.    - XII: tongue is midline without atrophy or fasciculations.  Sensory: Sensation grossly intact to LT  Results - Imaging/Labs   Results for orders placed or performed during the hospital encounter of 06/21/20 (from the past 48 hour(s))  CBC with Differential     Status: Abnormal   Collection Time: 06/21/20  7:10 PM  Result Value Ref Range   WBC 9.7 4.0 - 10.5 K/uL   RBC 4.07 3.87 - 5.11 MIL/uL   Hemoglobin 11.7 (L) 12.0 - 15.0 g/dL   HCT 37.1 36.0 - 46.0 %   MCV 91.2 80.0 - 100.0 fL   MCH 28.7 26.0 - 34.0 pg   MCHC 31.5 30.0 - 36.0 g/dL   RDW 13.5 11.5 - 15.5 %   Platelets 201 150 - 400 K/uL   nRBC 0.0 0.0 - 0.2 %   Neutrophils Relative % 71 %   Neutro Abs 6.9 1.7 - 7.7 K/uL   Lymphocytes Relative 19 %   Lymphs Abs 1.8 0.7 - 4.0  K/uL   Monocytes Relative 9 %   Monocytes Absolute 0.8 0.1 - 1.0 K/uL   Eosinophils Relative 0 %   Eosinophils Absolute 0.0 0.0 - 0.5 K/uL   Basophils Relative 1 %   Basophils Absolute 0.1 0.0 - 0.1 K/uL   Immature Granulocytes 0 %   Abs Immature Granulocytes 0.04 0.00 - 0.07 K/uL    Comment: Performed at [facility], [address] [place], [place] [address]  Basic metabolic panel     Status: Abnormal   Collection Time: 06/21/20  7:10 PM  Result Value Ref Range   Sodium 138 135 - 145 mmol/L   Potassium 3.3 (L) 3.5 - 5.1 mmol/L   Chloride 103 98 - 111 mmol/L   CO2 22 22 - 32 mmol/L   Glucose, Bld 122 (H) 70 - 99 mg/dL    Comment: Glucose reference range applies only to samples taken after fasting for at least 8 hours.   BUN 11 8 - 23 mg/dL   Creatinine, Ser 0.55 0.44 - 1.00 mg/dL   Calcium 9.2 8.9 - 10.3 mg/dL   GFR, Estimated >60 >60 mL/min    Comment: (NOTE) Calculated using the CKD-EPI Creatinine Equation (2021)    Anion gap 13 5 - 15    Comment: Performed at [facility] [address], [place], [place] [address]  Protime-INR     Status: None   Collection Time: 06/21/20  7:10 PM  Result Value Ref Range   Prothrombin Time 14.2 11.4 - 15.2 seconds   INR 1.1 0.8 - 1.2    Comment: (NOTE) INR goal varies based on device and disease states. Performed at [facility], [place] [address], [place], [place] [address]   Resp Panel by RT-PCR (Flu A&B, Covid) Nasopharyngeal Swab     Status: None   Collection  Time: 06/21/20  7:31 PM   Specimen: Nasopharyngeal Swab; Nasopharyngeal(NP) swabs in vial transport medium  Result Value Ref Range   SARS Coronavirus 2 by RT PCR NEGATIVE NEGATIVE    Comment: (NOTE) SARS-CoV-2 target nucleic acids are NOT DETECTED.  The SARS-CoV-2 RNA is generally detectable in upper respiratory specimens during the acute phase of infection. The lowest concentration of SARS-CoV-2 viral copies this assay can detect is 138 copies/mL. A  negative result does not preclude SARS-Cov-2 infection and should not be used as the sole basis for treatment or other patient management decisions. A negative result may occur with  improper specimen collection/handling, submission of specimen other than nasopharyngeal swab, presence of viral mutation(s) within the areas targeted by this assay, and inadequate number of viral copies(<138 copies/mL). A negative result must be combined with clinical observations, patient history, and epidemiological information. The expected result is Negative.  Fact Sheet for Patients:  EntrepreneurPulse.com.au  Fact Sheet for Healthcare Providers:  IncredibleEmployment.be  This test is no t yet approved or cleared by the [place] FDA and  has been authorized for detection and/or diagnosis of SARS-CoV-2 by FDA under an Emergency Use Authorization (EUA). This EUA will remain  in effect (meaning this test can be used) for the duration of the COVID-19 declaration under Section 564(b)(1) of the Act, 21 U.S.C.section 360bbb-3(b)(1), unless the authorization is terminated  or revoked sooner.       Influenza A by PCR NEGATIVE NEGATIVE   Influenza B by PCR NEGATIVE NEGATIVE    Comment: (NOTE) The Xpert Xpress SARS-CoV-2/FLU/RSV plus assay is intended as an aid in the diagnosis of influenza from Nasopharyngeal swab specimens and should not be used as a sole basis for treatment. Nasal washings and aspirates are unacceptable for Xpert Xpress SARS-CoV-2/FLU/RSV testing.  Fact Sheet for Patients: EntrepreneurPulse.com.au  Fact Sheet for Healthcare Providers: IncredibleEmployment.be  This test is not yet approved or cleared by the [place] FDA and has been authorized for detection and/or diagnosis of SARS-CoV-2 by FDA under an Emergency Use Authorization (EUA). This EUA will remain in effect (meaning this test can be used)  for the duration of the COVID-19 declaration under Section 564(b)(1) of the Act, 21 U.S.C. section 360bbb-3(b)(1), unless the authorization is terminated or revoked.  Performed at [facility], [place] [address] [place], [place] [address]     CT Head Wo Contrast  Result Date: 06/21/2020 CLINICAL DATA:  Fall with head trauma.  Anticoagulated. EXAM: CT HEAD WITHOUT CONTRAST CT CERVICAL SPINE WITHOUT CONTRAST TECHNIQUE: Multidetector CT imaging of the head and cervical spine was performed following the standard protocol without intravenous contrast. Multiplanar CT image reconstructions of the cervical spine were also generated. COMPARISON:  Head CT 01/04/2019 FINDINGS: CT HEAD FINDINGS Brain: No abnormality affects the brainstem. Old inferior cerebellar stroke on the left. Old left PCA territory infarction affecting the left occipital lobe. Old right parietal cortical and subcortical infarction. Chronic small-vessel ischemic changes diffusely throughout the hemispheric white matter. No sign of acute infarction, mass lesion, hemorrhage, hydrocephalus or extra-axial collection. Vascular: There is atherosclerotic calcification of the major vessels at the base of the brain. Skull: Negative.  No skull fracture. Sinuses/Orbits: Clear/normal Other: None CT CERVICAL SPINE FINDINGS Alignment: Normal Skull base and vertebrae: Acute nondisplaced fracture at the base of the dens, type 2. Acute nondisplaced fracture of the anterior arch of C1. No other regional injury. Soft tissues and spinal canal: No soft tissue finding. Disc levels:  Chronic arthritis at the  C1-2 articulation. C2-3: Chronic facet fusion.  No compressive stenosis. C3-4: Facet osteoarthritis. Bulging of the disc. Foraminal stenosis left worse than right. C4-5: Chronic fusion.  No stenosis. C5-6: Facet osteoarthritis on the left. Mild left bony foraminal stenosis. C6-7: Degenerative spondylosis with endplate osteophytes. Facet arthritis on the left.  Mild left foraminal narrowing. C7-T1: Chronic facet arthritis.  No significant stenosis. Upper chest: Negative Other: None IMPRESSION: HEAD CT: No acute or traumatic finding. Old infarctions as outlined above. CERVICAL SPINE CT: 1. Acute nondisplaced fracture at the base of the dens, type 2. Acute nondisplaced fracture of the anterior arch of C1. No other regional injury. 2. Chronic degenerative changes as outlined above. 3. These results were called by telephone at the time of interpretation on 06/21/2020 at 7:44 pm to provider [facility] , who verbally acknowledged these results. Electronically Signed   By: Nelson Chimes M.D.   On: 06/21/2020 19:44   CT Cervical Spine Wo Contrast  Result Date: 06/21/2020 CLINICAL DATA:  Fall with head trauma.  Anticoagulated. EXAM: CT HEAD WITHOUT CONTRAST CT CERVICAL SPINE WITHOUT CONTRAST TECHNIQUE: Multidetector CT imaging of the head and cervical spine was performed following the standard protocol without intravenous contrast. Multiplanar CT image reconstructions of the cervical spine were also generated. COMPARISON:  Head CT 01/04/2019 FINDINGS: CT HEAD FINDINGS Brain: No abnormality affects the brainstem. Old inferior cerebellar stroke on the left. Old left PCA territory infarction affecting the left occipital lobe. Old right parietal cortical and subcortical infarction. Chronic small-vessel ischemic changes diffusely throughout the hemispheric white matter. No sign of acute infarction, mass lesion, hemorrhage, hydrocephalus or extra-axial collection. Vascular: There is atherosclerotic calcification of the major vessels at the base of the brain. Skull: Negative.  No skull fracture. Sinuses/Orbits: Clear/normal Other: None CT CERVICAL SPINE FINDINGS Alignment: Normal Skull base and vertebrae: Acute nondisplaced fracture at the base of the dens, type 2. Acute nondisplaced fracture of the anterior arch of C1. No other regional injury. Soft tissues and spinal canal: No soft  tissue finding. Disc levels:  Chronic arthritis at the C1-2 articulation. C2-3: Chronic facet fusion.  No compressive stenosis. C3-4: Facet osteoarthritis. Bulging of the disc. Foraminal stenosis left worse than right. C4-5: Chronic fusion.  No stenosis. C5-6: Facet osteoarthritis on the left. Mild left bony foraminal stenosis. C6-7: Degenerative spondylosis with endplate osteophytes. Facet arthritis on the left. Mild left foraminal narrowing. C7-T1: Chronic facet arthritis.  No significant stenosis. Upper chest: Negative Other: None IMPRESSION: HEAD CT: No acute or traumatic finding. Old infarctions as outlined above. CERVICAL SPINE CT: 1. Acute nondisplaced fracture at the base of the dens, type 2. Acute nondisplaced fracture of the anterior arch of C1. No other regional injury. 2. Chronic degenerative changes as outlined above. 3. These results were called by telephone at the time of interpretation on 06/21/2020 at 7:44 pm to provider [facility] , who verbally acknowledged these results. Electronically Signed   By: Nelson Chimes M.D.   On: 06/21/2020 19:44   DG Chest Portable 1 View  Result Date: 06/21/2020 CLINICAL DATA:  Fall at home. EXAM: PORTABLE CHEST 1 VIEW COMPARISON:  Radiograph 06/26/2016 FINDINGS: Chronic cardiomegaly, not significantly changed. Stable mediastinal contours with aortic atherosclerosis. No pneumothorax or large pleural effusion. No visualized rib fracture. No focal airspace disease. Mild interstitial coarsening which is chronic. The bones are under mineralized. IMPRESSION: 1. No evidence of acute traumatic injury to the thorax. 2. Stable cardiomegaly. Electronically Signed   By: Aurther Loft.D.  On: 06/21/2020 19:58   Impression/Plan   87 y.o. female with nondisplaced C1 anterior arch fracture and nondisplaced type II odontoid fracture after a mechanical fall at home. She is neurologically intact. Fractures are stable and do not require surgical intervention. She has already  been provided with an aspen c collar which is to be worn at all times including when showering. I would like to see her outpatient in 1-2 weeks for continued monitoring. Please call for any concerns.  Ferne Reus, PA-C [facility] [employer]

## 2020-06-21 NOTE — ED Notes (Signed)
Patient transported to CT 

## 2020-06-21 NOTE — ED Provider Notes (Signed)
[facility] EMERGENCY DEPARTMENT Provider Note   CSN: [id number] Arrival date & time: 06/21/20  1855     History Chief Complaint  Patient presents with  . Fall    Samantha Bishop is a 87 y.o. female with a history of HTN, HLD, previous CVA, pAfib on Eliquis, and previous PE who presents to the ED via EMS from home as level 2 trauma for fall. Patient raking leaves just prior to arrival when she tripped on brick path and fell, striking the back of her head. Denies LOC. No prodromal symptoms. Patient able to ambulate following the fall. Worsening occipital pain prompting neighbor to call EMS. EMS gave 95mcg fentanyl en route. Upon arrival, ABCs intact, GCS 15, and HDS. Patient complaining of neck pain and occipital headache.   Fall This is a new problem. The current episode started less than 1 hour ago. The problem occurs rarely. The problem has not changed since onset.Associated symptoms include headaches. Pertinent negatives include no chest pain, no abdominal pain and no shortness of breath. Exacerbated by: direct pressure. The symptoms are relieved by narcotics.       Past Medical History:  Diagnosis Date  . Anxiety state, unspecified   . Calculus of kidney   . Cerebral aneurysm, nonruptured   . Colon polyp   . Complication of anesthesia    "my heart stopped" 2004 knee  surg - see note on chart  . DEEP VENOUS THROMBOPHLEBITIS 08/15/2007   Qualifier: History of  By: Lenna Gilford MD, Deborra Medina   . Diverticulosis of colon (without mention of hemorrhage)   . History of skin cancer   . HTN (hypertension)   . Hyperlipidemia   . Lumbago   . Osteoarthrosis, unspecified whether generalized or localized, unspecified site   . Permanent atrial fibrillation ([place])   . Pulmonary emboli ([place])    following remote knee arthroscopy surgery  . Ruptured lumbar disc   . Sleep apnea    stop bang score 4   . Unspecified hemorrhoids without mention of complication     Patient Active  Problem List   Diagnosis Date Noted  . Cervical spine fracture ([place]) 06/21/2020  . History of CVA (cerebrovascular accident) 06/09/2019  . Epistaxis 05/30/2019  . Gastritis and gastroduodenitis   . Bleeding 05/29/2019  . Venous stasis ulcer limited to breakdown of skin without varicose veins ([place]) 05/17/2019  . Ulcer of right lower extremity, limited to breakdown of skin ([place]) 05/17/2019  . Acute post-traumatic headache, not intractable 12/21/2018  . Hot flashes 02/09/2017  . Adjustment disorder with mixed anxiety and depressed mood 06/28/2016  . CVA (cerebral vascular accident) ([place]) 06/26/2016  . Right homonymous hemianopsia 06/26/2016  . Back pain 04/08/2015  . Abnormal CT scan 04/08/2015  . Intracranial aneurysm 10/18/2014  . Routine general medical examination at a health care facility 03/08/2014  . Occipital neuralgia 07/16/2013  . Venous insufficiency 07/11/2013  . Alopecia 06/08/2012  . Osteopenia 02/21/2010  . ANXIETY 05/21/2009  . Essential hypertension 05/21/2009  . Atrial fibrillation - followed by Dr. Caryl Comes 03/03/2009  . INTRACRANIAL ANEURYSM 08/16/2007  . Hyperlipidemia 08/15/2007  . GERD 08/15/2007    Past Surgical History:  Procedure Laterality Date  . ABDOMINAL HYSTERECTOMY    . APPENDECTOMY    . BLOCKED INTESTINE SURGERY    . BREAST CYST EXCISION    . ESOPHAGOGASTRODUODENOSCOPY (EGD) WITH PROPOFOL N/A 05/30/2019   Procedure: ESOPHAGOGASTRODUODENOSCOPY (EGD) WITH PROPOFOL;  Surgeon: Thornton Park, MD;  Location: [facility];  Service:  Gastroenterology;  Laterality: N/A;  . HEMHORROIDECTOMY    . JOINT REPLACEMENT  2004   RT TOTAL KNEE  . KNEE ARTHROSCOPY     X2 L KNEE  . KNEE SURGERY    . NASAL SURGERY X2    . TONSILLECTOMY AND ADENOIDECTOMY    . TOTAL KNEE ARTHROPLASTY  09/03/2011   Procedure: TOTAL KNEE ARTHROPLASTY;  Surgeon: Gearlean Alf, MD;  Location: [facility];  Service: Orthopedics;  Laterality: Left;     OB History   No obstetric history  on file.     Family History  Problem Relation Age of Onset  . Coronary artery disease Father   . Stroke Father   . Cirrhosis Brother   . Colon cancer Son   . Colon polyps Neg Hx   . Kidney disease Neg Hx   . Diabetes Neg Hx     Social History   Tobacco Use  . Smoking status: Never Smoker  . Smokeless tobacco: Never Used  Substance Use Topics  . Alcohol use: Yes    Alcohol/week: 0.0 standard drinks    Comment: Rarely  . Drug use: No    Home Medications Prior to Admission medications   Medication Sig Start Date End Date Taking? Authorizing Provider  apixaban (ELIQUIS) 5 MG TABS tablet TAKE 1 TABLET (5 MG TOTAL) BY MOUTH 2 (TWO) TIMES DAILY. 05/20/20   Hoyt Koch, MD  fluticasone Asencion Islam) 50 MCG/ACT nasal spray Place 2 sprays into both nostrils daily. 06/29/19   Granville Lewis C, PA-C  gabapentin (NEURONTIN) 300 MG capsule Take 2 capsules (600 mg total) by mouth 3 (three) times daily. 07/24/19   Hoyt Koch, MD  hydrochlorothiazide (HYDRODIURIL) 25 MG tablet Take 0.5 tablets (12.5 mg total) by mouth daily. 07/24/19   Hoyt Koch, MD  NON FORMULARY Diet order: Regular Diet.    [provider]  pravastatin (PRAVACHOL) 20 MG tablet Take 1 tablet (20 mg total) by mouth daily at 6 PM. 07/24/19   Hoyt Koch, MD  sodium chloride (OCEAN) 0.65 % SOLN nasal spray Place 1 spray into both nostrils as needed for congestion. 07/24/19   Hoyt Koch, MD  traMADol (ULTRAM) 50 MG tablet Take 1 tablet (50 mg total) by mouth 2 (two) times daily for 20 days. 06/29/19 07/19/19  Wille Celeste, PA-C    Allergies    Codeine and Penicillins  Review of Systems   Review of Systems  Constitutional: Negative for chills and fever.  HENT: Negative for ear pain and sore throat.   Eyes: Negative for pain and visual disturbance.  Respiratory: Negative for cough and shortness of breath.   Cardiovascular: Negative for chest pain and palpitations.   Gastrointestinal: Negative for abdominal pain and vomiting.  Genitourinary: Negative for dysuria and hematuria.  Musculoskeletal: Positive for neck pain. Negative for arthralgias and back pain.  Skin: Negative for color change and rash.  Neurological: Positive for headaches. Negative for seizures and syncope.  All other systems reviewed and are negative.   Physical Exam Updated Vital Signs BP (!) 165/101   Pulse 89   Temp 98.9 F (37.2 C) (Oral)   Resp 15   Ht 5\' 6"  (1.676 m)   Wt 81.6 kg   SpO2 92%   BMI 29.05 kg/m   Physical Exam Vitals and nursing note reviewed.  Constitutional:      General: She is in acute distress.     Appearance: She is well-developed, normal weight and well-nourished. She  is not ill-appearing.     Interventions: Cervical collar in place.  HENT:     Head: Normocephalic and atraumatic.     Jaw: There is normal jaw occlusion.     Right Ear: External ear normal.     Left Ear: External ear normal.     Nose: Nose normal.     Mouth/Throat:     Pharynx: No oropharyngeal exudate or posterior oropharyngeal erythema.  Eyes:     General: No visual field deficit or scleral icterus.       Right eye: No discharge.        Left eye: No discharge.     Extraocular Movements: Extraocular movements intact.     Conjunctiva/sclera: Conjunctivae normal.     Pupils: Pupils are equal, round, and reactive to light.  Neck:     Trachea: Trachea and phonation normal.  Cardiovascular:     Rate and Rhythm: Normal rate and regular rhythm.     Pulses: Normal pulses.     Heart sounds: Normal heart sounds. No murmur heard.   Pulmonary:     Effort: Pulmonary effort is normal. No respiratory distress.     Breath sounds: Normal breath sounds. No wheezing, rhonchi or rales.  Chest:     Chest wall: No tenderness.  Abdominal:     General: Abdomen is flat. There is no distension.     Palpations: Abdomen is soft.     Tenderness: There is no abdominal tenderness. There is no  guarding or rebound.  Musculoskeletal:        General: No edema.     Cervical back: Neck supple. Pain with movement and spinous process tenderness present. No muscular tenderness.     Right lower leg: No edema.     Left lower leg: No edema.     Comments: Tenderness to midline C-spine without stepoffs or deformities. No T/L spine tenderness, step offs, or deformities. Chest atraumatic and stable to compression. Extremities atraumatic and NVI.  Skin:    General: Skin is warm and dry.     Findings: No rash.  Neurological:     General: No focal deficit present.     Mental Status: She is alert and oriented to person, place, and time.     GCS: GCS eye subscore is 4. GCS verbal subscore is 5. GCS motor subscore is 6.     Cranial Nerves: Cranial nerves are intact. No cranial nerve deficit, dysarthria or facial asymmetry.     Sensory: Sensation is intact. No sensory deficit.     Motor: Motor function is intact. No weakness.     Coordination: Coordination is intact. Finger-Nose-Finger Test normal.  Psychiatric:        Mood and Affect: Mood and affect and mood normal.        Behavior: Behavior normal.     ED Results / Procedures / Treatments   Labs (all labs ordered are listed, but only abnormal results are displayed) Labs Reviewed  CBC WITH DIFFERENTIAL/PLATELET - Abnormal; Notable for the following components:      Result Value   Hemoglobin 11.7 (*)    All other components within normal limits  BASIC METABOLIC PANEL - Abnormal; Notable for the following components:   Potassium 3.3 (*)    Glucose, Bld 122 (*)    All other components within normal limits  RESP PANEL BY RT-PCR (FLU A&B, COVID) ARPGX2  PROTIME-INR  COMPREHENSIVE METABOLIC PANEL  CBC  PROTIME-INR  APTT  MAGNESIUM  PHOSPHORUS  EKG None  Radiology CT Head Wo Contrast  Result Date: 06/21/2020 CLINICAL DATA:  Fall with head trauma.  Anticoagulated. EXAM: CT HEAD WITHOUT CONTRAST CT CERVICAL SPINE WITHOUT CONTRAST  TECHNIQUE: Multidetector CT imaging of the head and cervical spine was performed following the standard protocol without intravenous contrast. Multiplanar CT image reconstructions of the cervical spine were also generated. COMPARISON:  Head CT 01/04/2019 FINDINGS: CT HEAD FINDINGS Brain: No abnormality affects the brainstem. Old inferior cerebellar stroke on the left. Old left PCA territory infarction affecting the left occipital lobe. Old right parietal cortical and subcortical infarction. Chronic small-vessel ischemic changes diffusely throughout the hemispheric white matter. No sign of acute infarction, mass lesion, hemorrhage, hydrocephalus or extra-axial collection. Vascular: There is atherosclerotic calcification of the major vessels at the base of the brain. Skull: Negative.  No skull fracture. Sinuses/Orbits: Clear/normal Other: None CT CERVICAL SPINE FINDINGS Alignment: Normal Skull base and vertebrae: Acute nondisplaced fracture at the base of the dens, type 2. Acute nondisplaced fracture of the anterior arch of C1. No other regional injury. Soft tissues and spinal canal: No soft tissue finding. Disc levels:  Chronic arthritis at the C1-2 articulation. C2-3: Chronic facet fusion.  No compressive stenosis. C3-4: Facet osteoarthritis. Bulging of the disc. Foraminal stenosis left worse than right. C4-5: Chronic fusion.  No stenosis. C5-6: Facet osteoarthritis on the left. Mild left bony foraminal stenosis. C6-7: Degenerative spondylosis with endplate osteophytes. Facet arthritis on the left. Mild left foraminal narrowing. C7-T1: Chronic facet arthritis.  No significant stenosis. Upper chest: Negative Other: None IMPRESSION: HEAD CT: No acute or traumatic finding. Old infarctions as outlined above. CERVICAL SPINE CT: 1. Acute nondisplaced fracture at the base of the dens, type 2. Acute nondisplaced fracture of the anterior arch of C1. No other regional injury. 2. Chronic degenerative changes as outlined above.  3. These results were called by telephone at the time of interpretation on 06/21/2020 at 7:44 pm to provider [facility] , who verbally acknowledged these results. Electronically Signed   By: Nelson Chimes M.D.   On: 06/21/2020 19:44   CT Cervical Spine Wo Contrast  Result Date: 06/21/2020 CLINICAL DATA:  Fall with head trauma.  Anticoagulated. EXAM: CT HEAD WITHOUT CONTRAST CT CERVICAL SPINE WITHOUT CONTRAST TECHNIQUE: Multidetector CT imaging of the head and cervical spine was performed following the standard protocol without intravenous contrast. Multiplanar CT image reconstructions of the cervical spine were also generated. COMPARISON:  Head CT 01/04/2019 FINDINGS: CT HEAD FINDINGS Brain: No abnormality affects the brainstem. Old inferior cerebellar stroke on the left. Old left PCA territory infarction affecting the left occipital lobe. Old right parietal cortical and subcortical infarction. Chronic small-vessel ischemic changes diffusely throughout the hemispheric white matter. No sign of acute infarction, mass lesion, hemorrhage, hydrocephalus or extra-axial collection. Vascular: There is atherosclerotic calcification of the major vessels at the base of the brain. Skull: Negative.  No skull fracture. Sinuses/Orbits: Clear/normal Other: None CT CERVICAL SPINE FINDINGS Alignment: Normal Skull base and vertebrae: Acute nondisplaced fracture at the base of the dens, type 2. Acute nondisplaced fracture of the anterior arch of C1. No other regional injury. Soft tissues and spinal canal: No soft tissue finding. Disc levels:  Chronic arthritis at the C1-2 articulation. C2-3: Chronic facet fusion.  No compressive stenosis. C3-4: Facet osteoarthritis. Bulging of the disc. Foraminal stenosis left worse than right. C4-5: Chronic fusion.  No stenosis. C5-6: Facet osteoarthritis on the left. Mild left bony foraminal stenosis. C6-7: Degenerative spondylosis with endplate osteophytes. Facet arthritis  on the left. Mild left  foraminal narrowing. C7-T1: Chronic facet arthritis.  No significant stenosis. Upper chest: Negative Other: None IMPRESSION: HEAD CT: No acute or traumatic finding. Old infarctions as outlined above. CERVICAL SPINE CT: 1. Acute nondisplaced fracture at the base of the dens, type 2. Acute nondisplaced fracture of the anterior arch of C1. No other regional injury. 2. Chronic degenerative changes as outlined above. 3. These results were called by telephone at the time of interpretation on 06/21/2020 at 7:44 pm to provider [facility] , who verbally acknowledged these results. Electronically Signed   By: Nelson Chimes M.D.   On: 06/21/2020 19:44   DG Chest Portable 1 View  Result Date: 06/21/2020 CLINICAL DATA:  Fall at home. EXAM: PORTABLE CHEST 1 VIEW COMPARISON:  Radiograph 06/26/2016 FINDINGS: Chronic cardiomegaly, not significantly changed. Stable mediastinal contours with aortic atherosclerosis. No pneumothorax or large pleural effusion. No visualized rib fracture. No focal airspace disease. Mild interstitial coarsening which is chronic. The bones are under mineralized. IMPRESSION: 1. No evidence of acute traumatic injury to the thorax. 2. Stable cardiomegaly. Electronically Signed   By: Roneisha Stern Rake M.D.   On: 06/21/2020 19:58    Procedures Procedures   Medications Ordered in ED Medications  fentaNYL (SUBLIMAZE) 100 MCG/2ML injection (  Canceled Entry 06/21/20 1928)  fentaNYL (SUBLIMAZE) injection (50 mcg Intravenous Given 06/21/20 1952)  polyethylene glycol (MIRALAX / GLYCOLAX) packet 17 g (has no administration in time range)  ondansetron (ZOFRAN) tablet 4 mg (has no administration in time range)    Or  ondansetron (ZOFRAN) injection 4 mg (has no administration in time range)  senna (SENOKOT) tablet 8.6 mg (has no administration in time range)  acetaminophen (TYLENOL) tablet 650 mg (has no administration in time range)    Or  acetaminophen (TYLENOL) suppository 650 mg (has no administration  in time range)  oxyCODONE (Oxy IR/ROXICODONE) immediate release tablet 5 mg (has no administration in time range)  morphine 2 MG/ML injection 1 mg (has no administration in time range)  acetaminophen (TYLENOL) tablet 1,000 mg (1,000 mg Oral Given 06/21/20 1943)    ED Course  I have reviewed the triage vital signs and the nursing notes.  Pertinent labs & imaging results that were available during my care of the patient were reviewed by me and considered in my medical decision making (see chart for details).    MDM Rules/Calculators/A&P                          Patient is a 22yoF with history and physical as described above who presents to the ED as a level 2 trauma activation for mechanical GLF. Patient endorsing exquisite occipital headache. VS notable for HTN, but otherwise HDS. No focal neuro deficits on exam. Initial treatment includes Tylenol and IV fentanyl. Initial workup includes CBC, BMP, coags, COVID, CT head, CT c-spine, and CXR.  Head CT and CXR unremarkable. Blood work unremarkable. CT C-spine notable for nondisplaced dens type 2 fracture as well as nondisplaced fracture of anterior arch C1. Discussed CT findings with Neurosurgeon who recommends Aspen collar at all times and follow up in clinic in 2 weeks; no acute surgical intervention at this time. No other traumatic injuries on exam. On reassessment, patient still experiencing exquisite neck pain despite multiple doses of fentanyl. Discussed patient with hospitalist who will admit patient overnight for further pain control and observation. Patient otherwise remained hemodynamically stable with no acute events during ED course. Patient  in stable condition at time of admission.  Final Clinical Impression(s) / ED Diagnoses Final diagnoses:  Fall, initial encounter    Rx / DC Orders ED Discharge Orders    None       Christy Gentles, MD 06/21/20 2045    Dorie Rank, MD 06/21/20 [id number]

## 2020-06-21 NOTE — H&P (Signed)
History and Physical  Patient Name: Samantha Bishop     VEL:[id number]    DOB: [birth year]    DOA: 06/21/2020 PCP: Hoyt Koch, MD  Patient coming from: Home  Chief Complaint: Ground-level fall and neck/head pain   HPI: Samantha Bishop is a 87 y.o. female with hx of hypertension, dyslipidemia, chronic A. fib, previous CVA, H/O PE, who presented from home on 06/21/2020 due to fall.   Patient was raking leaves just prior to presentation when she tripped fell, and hitting the back of her head on the ground.  She describes the fall as mechanical.  Did not have any loss of consciousness.  She had excruciating pain and could not ambulate afterwards.  Pain is located in the back of her neck, nonradiating.  Luckily her neighbor saw her and called EMS.  She has history of falls in the past.  She lives alone.  She has close family but lives out of town.  She uses a cane for ambulation.  She is on Eliquis for history of A. fib.  She denies any chest pain, dyspnea, diarrhea, constipation.  ED course: -On initial presentation, afebrile, heart rate 97, blood pressure 204/89, maintaining sats on room air. -Labs on initial presentation: Sodium 138, potassium 3.3, chloride 103, bicarb 22, glucose 122, BUN 11, creatinine 0.55, calcium 9.2, hemoglobin 11.7, WBC 9.7 -CT of cervical spine demonstrates acute nondisplaced fracture at the base of the dens, and acute nondisplaced fracture of the anterior arch of C1 -Neurosurgery contacted in the ED and evaluated.  Recommend conservative therapy, wearing a c-collar at all times -Patient was given fentanyl, Tylenol, but still had significant head and neck pain.  Hospitalist service was consulted given intractable pain and an unstable to go home.     ROS: A complete and thorough 12 point review systems obtained, negative listed in HPI.      Past Medical History:  Diagnosis Date  . Anxiety state, unspecified   . Calculus of kidney   . Cerebral aneurysm,  nonruptured   . Colon polyp   . Complication of anesthesia    "my heart stopped" 2004 knee  surg - see note on chart  . DEEP VENOUS THROMBOPHLEBITIS 08/15/2007   Qualifier: History of  By: Lenna Gilford MD, Deborra Medina   . Diverticulosis of colon (without mention of hemorrhage)   . History of skin cancer   . HTN (hypertension)   . Hyperlipidemia   . Lumbago   . Osteoarthrosis, unspecified whether generalized or localized, unspecified site   . Permanent atrial fibrillation ([place])   . Pulmonary emboli ([place])    following remote knee arthroscopy surgery  . Ruptured lumbar disc   . Sleep apnea    stop bang score 4   . Unspecified hemorrhoids without mention of complication     Past Surgical History:  Procedure Laterality Date  . ABDOMINAL HYSTERECTOMY    . APPENDECTOMY    . BLOCKED INTESTINE SURGERY    . BREAST CYST EXCISION    . ESOPHAGOGASTRODUODENOSCOPY (EGD) WITH PROPOFOL N/A 05/30/2019   Procedure: ESOPHAGOGASTRODUODENOSCOPY (EGD) WITH PROPOFOL;  Surgeon: Thornton Park, MD;  Location: [facility];  Service: Gastroenterology;  Laterality: N/A;  . HEMHORROIDECTOMY    . JOINT REPLACEMENT  2004   RT TOTAL KNEE  . KNEE ARTHROSCOPY     X2 L KNEE  . KNEE SURGERY    . NASAL SURGERY X2    . TONSILLECTOMY AND ADENOIDECTOMY    . TOTAL KNEE ARTHROPLASTY  09/03/2011  Procedure: TOTAL KNEE ARTHROPLASTY;  Surgeon: Gearlean Alf, MD;  Location: [facility];  Service: Orthopedics;  Laterality: Left;    Social History: Patient lives by herself.  The patient walks with a cane.  Non-smoker.  Allergies  Allergen Reactions  . Codeine Nausea Only  . Penicillins Rash    Family history: family history includes Cirrhosis in her brother; Colon cancer in her son; Coronary artery disease in her father; Stroke in her father.  Prior to Admission medications   Medication Sig Start Date End Date Taking? Authorizing Provider  apixaban (ELIQUIS) 5 MG TABS tablet TAKE 1 TABLET (5 MG TOTAL) BY MOUTH 2 (TWO) TIMES  DAILY. 05/20/20   Hoyt Koch, MD  fluticasone Asencion Islam) 50 MCG/ACT nasal spray Place 2 sprays into both nostrils daily. 06/29/19   Granville Lewis C, PA-C  gabapentin (NEURONTIN) 300 MG capsule Take 2 capsules (600 mg total) by mouth 3 (three) times daily. 07/24/19   Hoyt Koch, MD  hydrochlorothiazide (HYDRODIURIL) 25 MG tablet Take 0.5 tablets (12.5 mg total) by mouth daily. 07/24/19   Hoyt Koch, MD  NON FORMULARY Diet order: Regular Diet.    [provider]  pravastatin (PRAVACHOL) 20 MG tablet Take 1 tablet (20 mg total) by mouth daily at 6 PM. 07/24/19   Hoyt Koch, MD  sodium chloride (OCEAN) 0.65 % SOLN nasal spray Place 1 spray into both nostrils as needed for congestion. 07/24/19   Hoyt Koch, MD  traMADol (ULTRAM) 50 MG tablet Take 1 tablet (50 mg total) by mouth 2 (two) times daily for 20 days. 06/29/19 07/19/19  Wille Celeste, PA-C       Physical Exam: BP (!) 165/101   Pulse 89   Temp 98.9 F (37.2 C) (Oral)   Resp 15   Ht 5\' 6"  (1.676 m)   Wt 81.6 kg   SpO2 92%   BMI 29.05 kg/m  General appearance: Well-developed, elderly adult female, alert and in moderate distress secondary to pain Eyes: Anicteric, conjunctiva pink, lids and lashes normal. PERRL.    ENT: Some dried blood around her nose and in her mouth s.   Neck: C-collar in place Lymph: No cervical or supraclavicular lymphadenopathy. Skin: Warm and dry.  No jaundice.  No suspicious rashes or lesions. Cardiac: Irregularly irregular, nl S1-S2, no murmurs appreciated.   Trace LE edema.  Radial and pedal pulses 2+ and symmetric. Respiratory: Normal respiratory rate and rhythm.  CTAB without rales or wheezes. Abdomen: Abdomen soft.  Nontender to palpitation, bowel sounds present.  No ascites, distension, hepatosplenomegaly.   MSK: No deformities or effusions of the large joints of the upper or lower extremities bilaterally.  No cyanosis or clubbing. Neuro: Cranial  nerves 2 through 12 grossly intact.  Sensation intact to light touch. Speech is fluent.  Muscle strength in upper and lower extremities equal.    Psych: Sensorium intact and responding to questions, attention normal.  Moderate distress secondary to pain.  Judgment and insight appear normal.     Labs on Admission:  I have personally reviewed following labs and imaging studies: CBC: Recent Labs  Lab 06/21/20 1910  WBC 9.7  NEUTROABS 6.9  HGB 11.7*  HCT 37.1  MCV 91.2  PLT 300   Basic Metabolic Panel: Recent Labs  Lab 06/21/20 1910  NA 138  K 3.3*  CL 103  CO2 22  GLUCOSE 122*  BUN 11  CREATININE 0.55  CALCIUM 9.2   GFR: Estimated Creatinine Clearance: 47.3  mL/min (by C-G formula based on SCr of 0.55 mg/dL).  Liver Function Tests: No results for input(s): AST, ALT, ALKPHOS, BILITOT, PROT, ALBUMIN in the last 168 hours. No results for input(s): LIPASE, AMYLASE in the last 168 hours. No results for input(s): AMMONIA in the last 168 hours. Coagulation Profile: Recent Labs  Lab 06/21/20 1910  INR 1.1   Cardiac Enzymes: No results for input(s): CKTOTAL, CKMB, CKMBINDEX, TROPONINI in the last 168 hours. BNP (last 3 results) No results for input(s): PROBNP in the last 8760 hours. HbA1C: No results for input(s): HGBA1C in the last 72 hours. CBG: No results for input(s): GLUCAP in the last 168 hours. Lipid Profile: No results for input(s): CHOL, HDL, LDLCALC, TRIG, CHOLHDL, LDLDIRECT in the last 72 hours. Thyroid Function Tests: No results for input(s): TSH, T4TOTAL, FREET4, T3FREE, THYROIDAB in the last 72 hours. Anemia Panel: No results for input(s): VITAMINB12, FOLATE, FERRITIN, TIBC, IRON, RETICCTPCT in the last 72 hours.   Recent Results (from the past 240 hour(s))  Resp Panel by RT-PCR (Flu A&B, Covid) Nasopharyngeal Swab     Status: None   Collection Time: 06/21/20  7:31 PM   Specimen: Nasopharyngeal Swab; Nasopharyngeal(NP) swabs in vial transport medium   Result Value Ref Range Status   SARS Coronavirus 2 by RT PCR NEGATIVE NEGATIVE Final    Comment: (NOTE) SARS-CoV-2 target nucleic acids are NOT DETECTED.  The SARS-CoV-2 RNA is generally detectable in upper respiratory specimens during the acute phase of infection. The lowest concentration of SARS-CoV-2 viral copies this assay can detect is 138 copies/mL. A negative result does not preclude SARS-Cov-2 infection and should not be used as the sole basis for treatment or other patient management decisions. A negative result may occur with  improper specimen collection/handling, submission of specimen other than nasopharyngeal swab, presence of viral mutation(s) within the areas targeted by this assay, and inadequate number of viral copies(<138 copies/mL). A negative result must be combined with clinical observations, patient history, and epidemiological information. The expected result is Negative.  Fact Sheet for Patients:  EntrepreneurPulse.com.au  Fact Sheet for Healthcare Providers:  IncredibleEmployment.be  This test is no t yet approved or cleared by the [place] FDA and  has been authorized for detection and/or diagnosis of SARS-CoV-2 by FDA under an Emergency Use Authorization (EUA). This EUA will remain  in effect (meaning this test can be used) for the duration of the COVID-19 declaration under Section 564(b)(1) of the Act, 21 U.S.C.section 360bbb-3(b)(1), unless the authorization is terminated  or revoked sooner.       Influenza A by PCR NEGATIVE NEGATIVE Final   Influenza B by PCR NEGATIVE NEGATIVE Final    Comment: (NOTE) The Xpert Xpress SARS-CoV-2/FLU/RSV plus assay is intended as an aid in the diagnosis of influenza from Nasopharyngeal swab specimens and should not be used as a sole basis for treatment. Nasal washings and aspirates are unacceptable for Xpert Xpress SARS-CoV-2/FLU/RSV testing.  Fact Sheet for  Patients: EntrepreneurPulse.com.au  Fact Sheet for Healthcare Providers: IncredibleEmployment.be  This test is not yet approved or cleared by the [place] FDA and has been authorized for detection and/or diagnosis of SARS-CoV-2 by FDA under an Emergency Use Authorization (EUA). This EUA will remain in effect (meaning this test can be used) for the duration of the COVID-19 declaration under Section 564(b)(1) of the Act, 21 U.S.C. section 360bbb-3(b)(1), unless the authorization is terminated or revoked.  Performed at [facility], [place] [address] [place], [place] [address]  Radiological Exams on Admission: Personally reviewed: CT of cervical spine reviewed personally, demonstrates acute nondisplaced fracture at the base of the dens, and acute nondisplaced fracture of the anterior arch of C1 CT Head Wo Contrast  Result Date: 06/21/2020 CLINICAL DATA:  Fall with head trauma.  Anticoagulated. EXAM: CT HEAD WITHOUT CONTRAST CT CERVICAL SPINE WITHOUT CONTRAST TECHNIQUE: Multidetector CT imaging of the head and cervical spine was performed following the standard protocol without intravenous contrast. Multiplanar CT image reconstructions of the cervical spine were also generated. COMPARISON:  Head CT 01/04/2019 FINDINGS: CT HEAD FINDINGS Brain: No abnormality affects the brainstem. Old inferior cerebellar stroke on the left. Old left PCA territory infarction affecting the left occipital lobe. Old right parietal cortical and subcortical infarction. Chronic small-vessel ischemic changes diffusely throughout the hemispheric white matter. No sign of acute infarction, mass lesion, hemorrhage, hydrocephalus or extra-axial collection. Vascular: There is atherosclerotic calcification of the major vessels at the base of the brain. Skull: Negative.  No skull fracture. Sinuses/Orbits: Clear/normal Other: None CT CERVICAL SPINE FINDINGS Alignment: Normal  Skull base and vertebrae: Acute nondisplaced fracture at the base of the dens, type 2. Acute nondisplaced fracture of the anterior arch of C1. No other regional injury. Soft tissues and spinal canal: No soft tissue finding. Disc levels:  Chronic arthritis at the C1-2 articulation. C2-3: Chronic facet fusion.  No compressive stenosis. C3-4: Facet osteoarthritis. Bulging of the disc. Foraminal stenosis left worse than right. C4-5: Chronic fusion.  No stenosis. C5-6: Facet osteoarthritis on the left. Mild left bony foraminal stenosis. C6-7: Degenerative spondylosis with endplate osteophytes. Facet arthritis on the left. Mild left foraminal narrowing. C7-T1: Chronic facet arthritis.  No significant stenosis. Upper chest: Negative Other: None IMPRESSION: HEAD CT: No acute or traumatic finding. Old infarctions as outlined above. CERVICAL SPINE CT: 1. Acute nondisplaced fracture at the base of the dens, type 2. Acute nondisplaced fracture of the anterior arch of C1. No other regional injury. 2. Chronic degenerative changes as outlined above. 3. These results were called by telephone at the time of interpretation on 06/21/2020 at 7:44 pm to provider [facility] , who verbally acknowledged these results. Electronically Signed   By: Nelson Chimes M.D.   On: 06/21/2020 19:44   CT Cervical Spine Wo Contrast  Result Date: 06/21/2020 CLINICAL DATA:  Fall with head trauma.  Anticoagulated. EXAM: CT HEAD WITHOUT CONTRAST CT CERVICAL SPINE WITHOUT CONTRAST TECHNIQUE: Multidetector CT imaging of the head and cervical spine was performed following the standard protocol without intravenous contrast. Multiplanar CT image reconstructions of the cervical spine were also generated. COMPARISON:  Head CT 01/04/2019 FINDINGS: CT HEAD FINDINGS Brain: No abnormality affects the brainstem. Old inferior cerebellar stroke on the left. Old left PCA territory infarction affecting the left occipital lobe. Old right parietal cortical and subcortical  infarction. Chronic small-vessel ischemic changes diffusely throughout the hemispheric white matter. No sign of acute infarction, mass lesion, hemorrhage, hydrocephalus or extra-axial collection. Vascular: There is atherosclerotic calcification of the major vessels at the base of the brain. Skull: Negative.  No skull fracture. Sinuses/Orbits: Clear/normal Other: None CT CERVICAL SPINE FINDINGS Alignment: Normal Skull base and vertebrae: Acute nondisplaced fracture at the base of the dens, type 2. Acute nondisplaced fracture of the anterior arch of C1. No other regional injury. Soft tissues and spinal canal: No soft tissue finding. Disc levels:  Chronic arthritis at the C1-2 articulation. C2-3: Chronic facet fusion.  No compressive stenosis. C3-4: Facet osteoarthritis. Bulging of the disc. Foraminal stenosis left  worse than right. C4-5: Chronic fusion.  No stenosis. C5-6: Facet osteoarthritis on the left. Mild left bony foraminal stenosis. C6-7: Degenerative spondylosis with endplate osteophytes. Facet arthritis on the left. Mild left foraminal narrowing. C7-T1: Chronic facet arthritis.  No significant stenosis. Upper chest: Negative Other: None IMPRESSION: HEAD CT: No acute or traumatic finding. Old infarctions as outlined above. CERVICAL SPINE CT: 1. Acute nondisplaced fracture at the base of the dens, type 2. Acute nondisplaced fracture of the anterior arch of C1. No other regional injury. 2. Chronic degenerative changes as outlined above. 3. These results were called by telephone at the time of interpretation on 06/21/2020 at 7:44 pm to provider [facility] , who verbally acknowledged these results. Electronically Signed   By: Nelson Chimes M.D.   On: 06/21/2020 19:44   DG Chest Portable 1 View  Result Date: 06/21/2020 CLINICAL DATA:  Fall at home. EXAM: PORTABLE CHEST 1 VIEW COMPARISON:  Radiograph 06/26/2016 FINDINGS: Chronic cardiomegaly, not significantly changed. Stable mediastinal contours with aortic  atherosclerosis. No pneumothorax or large pleural effusion. No visualized rib fracture. No focal airspace disease. Mild interstitial coarsening which is chronic. The bones are under mineralized. IMPRESSION: 1. No evidence of acute traumatic injury to the thorax. 2. Stable cardiomegaly. Electronically Signed   By: Keith Rake M.D.   On: 06/21/2020 19:58    EKG: Independently reviewed.  A. fib     Assessment/Plan   1.  Acute cervical fractures -CT of cervical spine reviewed personally, demonstrates acute nondisplaced fracture at the base of the dens, and acute nondisplaced fracture of the anterior arch of C1 -Neurosurgery evaluated patient in the ED, recommend nonoperative management, c-collar in place at all times and follow-up outpatient in 1-2 weeks -Continue c-collar at all times -Pain control is warranted -consulted PT and OT  2.  Ground-level fall -Mechanical ground-level fall resulting in acute cervical fractures -See further plans above  3.  Intractable pain related to cervical fractures -Pain control as warranted -See further plans above  4.  Chronic atrial fibrillation -Does not appear to be on any rate or rhythm controlled medications -Hold home Eliquis for now given fall  5.  Essential hypertension -At home appears to be only on HCTZ.  We will hold for now.  Might not be the greatest medication, diuretic, given her age and history of falls -Anticipate pain contributing to her hypertension as well -Added on labetolol PRN  6.  Hypokalemia -On admission potassium 3.3 -Potassium supplementation ordered -Added on magnesium -Follow-up labs ordered     DVT prophylaxis: SCDs and was on Eliquis at home Code Status: Full Family Communication: Discussed case with son Quillian Quince and daughter-in-law Disposition Plan: Anticipate discharge to rehab facility Consults called: Neurosurgery contacted in the ED Admission status: Observation   At the point of initial  evaluation, it is my clinical opinion that admission for OBSERVATION is reasonable and necessary because the patient's presenting complaints in the context of their chronic conditions represent sufficient risk of deterioration or significant morbidity to constitute reasonable grounds for close observation in the hospital setting, but that the patient may be medically stable for discharge from the hospital within 24 to 48 hours.    Medical decision making: Patient seen at 9:17 PM on 06/21/2020.  The patient was discussed with son Quillian Quince and daughter-in-law via phone.  What exists of the patient's chart was reviewed in depth and summarized above.  Clinical condition: Fair.        Doran Heater [facility]  Please page though [place] or Epic secure chat:  For password, contact charge nurse

## 2020-06-22 ENCOUNTER — Inpatient Hospital Stay (HOSPITAL_COMMUNITY): Payer: Medicare Other

## 2020-06-22 DIAGNOSIS — I872 Venous insufficiency (chronic) (peripheral): Secondary | ICD-10-CM | POA: Diagnosis present

## 2020-06-22 DIAGNOSIS — Z9181 History of falling: Secondary | ICD-10-CM | POA: Diagnosis not present

## 2020-06-22 DIAGNOSIS — W19XXXA Unspecified fall, initial encounter: Secondary | ICD-10-CM | POA: Diagnosis not present

## 2020-06-22 DIAGNOSIS — I6300 Cerebral infarction due to thrombosis of unspecified precerebral artery: Secondary | ICD-10-CM | POA: Diagnosis not present

## 2020-06-22 DIAGNOSIS — Z86711 Personal history of pulmonary embolism: Secondary | ICD-10-CM | POA: Diagnosis not present

## 2020-06-22 DIAGNOSIS — W1830XA Fall on same level, unspecified, initial encounter: Secondary | ICD-10-CM | POA: Diagnosis not present

## 2020-06-22 DIAGNOSIS — I671 Cerebral aneurysm, nonruptured: Secondary | ICD-10-CM | POA: Diagnosis present

## 2020-06-22 DIAGNOSIS — Z79899 Other long term (current) drug therapy: Secondary | ICD-10-CM | POA: Diagnosis not present

## 2020-06-22 DIAGNOSIS — E559 Vitamin D deficiency, unspecified: Secondary | ICD-10-CM | POA: Diagnosis present

## 2020-06-22 DIAGNOSIS — G4730 Sleep apnea, unspecified: Secondary | ICD-10-CM | POA: Diagnosis present

## 2020-06-22 DIAGNOSIS — K219 Gastro-esophageal reflux disease without esophagitis: Secondary | ICD-10-CM | POA: Diagnosis present

## 2020-06-22 DIAGNOSIS — Z20822 Contact with and (suspected) exposure to covid-19: Secondary | ICD-10-CM | POA: Diagnosis present

## 2020-06-22 DIAGNOSIS — Z743 Need for continuous supervision: Secondary | ICD-10-CM | POA: Diagnosis not present

## 2020-06-22 DIAGNOSIS — Z823 Family history of stroke: Secondary | ICD-10-CM | POA: Diagnosis not present

## 2020-06-22 DIAGNOSIS — I482 Chronic atrial fibrillation, unspecified: Secondary | ICD-10-CM | POA: Diagnosis not present

## 2020-06-22 DIAGNOSIS — E785 Hyperlipidemia, unspecified: Secondary | ICD-10-CM | POA: Diagnosis present

## 2020-06-22 DIAGNOSIS — R2689 Other abnormalities of gait and mobility: Secondary | ICD-10-CM | POA: Diagnosis not present

## 2020-06-22 DIAGNOSIS — W010XXA Fall on same level from slipping, tripping and stumbling without subsequent striking against object, initial encounter: Secondary | ICD-10-CM | POA: Diagnosis present

## 2020-06-22 DIAGNOSIS — S12110A Anterior displaced Type II dens fracture, initial encounter for closed fracture: Secondary | ICD-10-CM | POA: Diagnosis present

## 2020-06-22 DIAGNOSIS — Z96653 Presence of artificial knee joint, bilateral: Secondary | ICD-10-CM | POA: Diagnosis present

## 2020-06-22 DIAGNOSIS — I4821 Permanent atrial fibrillation: Secondary | ICD-10-CM | POA: Diagnosis present

## 2020-06-22 DIAGNOSIS — Z85828 Personal history of other malignant neoplasm of skin: Secondary | ICD-10-CM | POA: Diagnosis not present

## 2020-06-22 DIAGNOSIS — I4811 Longstanding persistent atrial fibrillation: Secondary | ICD-10-CM | POA: Diagnosis not present

## 2020-06-22 DIAGNOSIS — K5900 Constipation, unspecified: Secondary | ICD-10-CM | POA: Diagnosis not present

## 2020-06-22 DIAGNOSIS — Z8672 Personal history of thrombophlebitis: Secondary | ICD-10-CM | POA: Diagnosis not present

## 2020-06-22 DIAGNOSIS — R519 Headache, unspecified: Secondary | ICD-10-CM | POA: Diagnosis not present

## 2020-06-22 DIAGNOSIS — Z7401 Bed confinement status: Secondary | ICD-10-CM | POA: Diagnosis not present

## 2020-06-22 DIAGNOSIS — I119 Hypertensive heart disease without heart failure: Secondary | ICD-10-CM | POA: Diagnosis present

## 2020-06-22 DIAGNOSIS — F411 Generalized anxiety disorder: Secondary | ICD-10-CM | POA: Diagnosis present

## 2020-06-22 DIAGNOSIS — Z7901 Long term (current) use of anticoagulants: Secondary | ICD-10-CM | POA: Diagnosis not present

## 2020-06-22 DIAGNOSIS — R6889 Other general symptoms and signs: Secondary | ICD-10-CM | POA: Diagnosis not present

## 2020-06-22 DIAGNOSIS — R278 Other lack of coordination: Secondary | ICD-10-CM | POA: Diagnosis not present

## 2020-06-22 DIAGNOSIS — Z90710 Acquired absence of both cervix and uterus: Secondary | ICD-10-CM | POA: Diagnosis not present

## 2020-06-22 DIAGNOSIS — S12000A Unspecified displaced fracture of first cervical vertebra, initial encounter for closed fracture: Secondary | ICD-10-CM | POA: Diagnosis not present

## 2020-06-22 DIAGNOSIS — M6281 Muscle weakness (generalized): Secondary | ICD-10-CM | POA: Diagnosis not present

## 2020-06-22 DIAGNOSIS — S129XXA Fracture of neck, unspecified, initial encounter: Secondary | ICD-10-CM | POA: Diagnosis present

## 2020-06-22 DIAGNOSIS — M2550 Pain in unspecified joint: Secondary | ICD-10-CM | POA: Diagnosis not present

## 2020-06-22 DIAGNOSIS — I10 Essential (primary) hypertension: Secondary | ICD-10-CM | POA: Diagnosis not present

## 2020-06-22 DIAGNOSIS — S12031D Nondisplaced posterior arch fracture of first cervical vertebra, subsequent encounter for fracture with routine healing: Secondary | ICD-10-CM | POA: Diagnosis not present

## 2020-06-22 DIAGNOSIS — Z79891 Long term (current) use of opiate analgesic: Secondary | ICD-10-CM | POA: Diagnosis not present

## 2020-06-22 DIAGNOSIS — I480 Paroxysmal atrial fibrillation: Secondary | ICD-10-CM | POA: Diagnosis not present

## 2020-06-22 DIAGNOSIS — S12090A Other displaced fracture of first cervical vertebra, initial encounter for closed fracture: Secondary | ICD-10-CM | POA: Diagnosis present

## 2020-06-22 DIAGNOSIS — E876 Hypokalemia: Secondary | ICD-10-CM | POA: Diagnosis present

## 2020-06-22 DIAGNOSIS — S12112D Nondisplaced Type II dens fracture, subsequent encounter for fracture with routine healing: Secondary | ICD-10-CM | POA: Diagnosis not present

## 2020-06-22 DIAGNOSIS — Z8673 Personal history of transient ischemic attack (TIA), and cerebral infarction without residual deficits: Secondary | ICD-10-CM | POA: Diagnosis not present

## 2020-06-22 LAB — TSH: TSH: 1.093 u[IU]/mL (ref 0.350–4.500)

## 2020-06-22 LAB — CBC
HCT: 32.6 % — ABNORMAL LOW (ref 36.0–46.0)
Hemoglobin: 11 g/dL — ABNORMAL LOW (ref 12.0–15.0)
MCH: 29.7 pg (ref 26.0–34.0)
MCHC: 33.7 g/dL (ref 30.0–36.0)
MCV: 88.1 fL (ref 80.0–100.0)
Platelets: 185 10*3/uL (ref 150–400)
RBC: 3.7 MIL/uL — ABNORMAL LOW (ref 3.87–5.11)
RDW: 13.7 % (ref 11.5–15.5)
WBC: 8.7 10*3/uL (ref 4.0–10.5)
nRBC: 0 % (ref 0.0–0.2)

## 2020-06-22 LAB — COMPREHENSIVE METABOLIC PANEL
ALT: 13 U/L (ref 0–44)
AST: 18 U/L (ref 15–41)
Albumin: 3.7 g/dL (ref 3.5–5.0)
Alkaline Phosphatase: 59 U/L (ref 38–126)
Anion gap: 11 (ref 5–15)
BUN: 10 mg/dL (ref 8–23)
CO2: 26 mmol/L (ref 22–32)
Calcium: 9.2 mg/dL (ref 8.9–10.3)
Chloride: 100 mmol/L (ref 98–111)
Creatinine, Ser: 0.52 mg/dL (ref 0.44–1.00)
GFR, Estimated: >60 mL/min (ref >60)
Glucose, Bld: 143 mg/dL — ABNORMAL HIGH (ref 70–99)
Potassium: 3.4 mmol/L — ABNORMAL LOW (ref 3.5–5.1)
Sodium: 137 mmol/L (ref 135–145)
Total Bilirubin: 0.9 mg/dL (ref 0.3–1.2)
Total Protein: 6.7 g/dL (ref 6.5–8.1)

## 2020-06-22 LAB — PHOSPHORUS: Phosphorus: 3.7 mg/dL (ref 2.5–4.6)

## 2020-06-22 LAB — MAGNESIUM: Magnesium: 1.9 mg/dL (ref 1.7–2.4)

## 2020-06-22 LAB — APTT: aPTT: 31 seconds (ref 24–36)

## 2020-06-22 LAB — PROTIME-INR
INR: 1.3 — ABNORMAL HIGH (ref 0.8–1.2)
Prothrombin Time: 15.3 seconds — ABNORMAL HIGH (ref 11.4–15.2)

## 2020-06-22 LAB — VITAMIN D 25 HYDROXY (VIT D DEFICIENCY, FRACTURES): Vit D, 25-Hydroxy: 23.09 ng/mL — ABNORMAL LOW (ref 30–100)

## 2020-06-22 MED ORDER — LACTATED RINGERS IV SOLN: INTRAVENOUS | Status: DC

## 2020-06-22 MED ORDER — ACETAMINOPHEN 500 MG PO TABS
1000.0000 mg | ORAL_TABLET | Freq: Three times a day (TID) | ORAL | Status: AC
  Administered 2020-06-22 – 2020-06-24 (×9): 1000 mg via ORAL
  Filled 2020-06-22 (×9): qty 2

## 2020-06-22 MED ORDER — HYDRALAZINE HCL 20 MG/ML IJ SOLN: 10.0000 mg | Freq: Four times a day (QID) | INTRAMUSCULAR | Status: DC | PRN

## 2020-06-22 MED ORDER — LACTATED RINGERS IV SOLN: INTRAVENOUS | Status: AC

## 2020-06-22 MED ORDER — KETOROLAC TROMETHAMINE 30 MG/ML IJ SOLN
30.0000 mg | Freq: Once | INTRAMUSCULAR | Status: AC
  Administered 2020-06-22: 30 mg via INTRAVENOUS
  Filled 2020-06-22: qty 1

## 2020-06-22 MED ORDER — MORPHINE SULFATE (PF) 2 MG/ML IV SOLN
2.0000 mg | INTRAVENOUS | Status: DC | PRN
  Administered 2020-06-22 – 2020-06-29 (×18): 2 mg via INTRAVENOUS
  Filled 2020-06-22 (×19): qty 1

## 2020-06-22 MED ORDER — LORAZEPAM 2 MG/ML IJ SOLN
0.5000 mg | Freq: Three times a day (TID) | INTRAMUSCULAR | Status: DC | PRN
  Administered 2020-06-23: 0.5 mg via INTRAVENOUS
  Filled 2020-06-22: qty 1

## 2020-06-22 MED ORDER — HYDROCHLOROTHIAZIDE 25 MG PO TABS
12.5000 mg | ORAL_TABLET | Freq: Every day | ORAL | Status: DC
  Administered 2020-06-22 – 2020-06-30 (×9): 12.5 mg via ORAL
  Filled 2020-06-22 (×9): qty 1

## 2020-06-22 MED ORDER — POTASSIUM CHLORIDE CRYS ER 20 MEQ PO TBCR
40.0000 meq | EXTENDED_RELEASE_TABLET | Freq: Once | ORAL | Status: AC
  Administered 2020-06-22: 40 meq via ORAL
  Filled 2020-06-22: qty 2

## 2020-06-22 MED ORDER — VITAMIN D 25 MCG (1000 UNIT) PO TABS
2000.0000 [IU] | ORAL_TABLET | Freq: Every day | ORAL | Status: DC
  Administered 2020-06-22 – 2020-06-30 (×9): 2000 [IU] via ORAL
  Filled 2020-06-22 (×9): qty 2

## 2020-06-22 MED ORDER — APIXABAN 5 MG PO TABS
5.0000 mg | ORAL_TABLET | Freq: Two times a day (BID) | ORAL | Status: DC
  Administered 2020-06-22 – 2020-06-30 (×16): 5 mg via ORAL
  Filled 2020-06-22: qty 2
  Filled 2020-06-22 (×15): qty 1

## 2020-06-22 NOTE — Progress Notes (Signed)
Pt complains of pain between 8 and 10, still requiring IV pain medications. Having difficulty getting comfortable especially with the MJ Collar on.  Vitals are stable. Pt has no other complaints. Will continue to monitor.

## 2020-06-22 NOTE — Progress Notes (Addendum)
[facility]                                                                              Patient Demographics  Samantha Bishop, is a 87 y.o. female, DOB - January 11, 1925, CVE:[id number]  Admit date - 06/21/2020   Admitting Physician Doran Heater, DO  Outpatient Primary MD for the patient is Hoyt Koch, MD  Outpatient specialists:   LOS - 0  days   Medical records reviewed and are as summarized below:    Chief Complaint  Patient presents with  . Fall       Brief summary   Patient is a 87 year old female with hypertension, dyslipidemia, chronic A. fib on Eliquis, previous CVA, H/O PE, who presented from home on 06/21/2020 due to fall. Patient was raking leaves just prior to presentation when she tripped fell, and hitting the back of her head on the ground.  She describes the fall as mechanical.  Did not have any loss of consciousness.  She had excruciating pain and could not ambulate afterwards.  Pain is located in the back of her neck, nonradiating. Lives alone, uses cane for ambulation.  CT of cervical spine demonstrates acute nondisplaced fracture at the base of the dens, and acute nondisplaced fracture of the anterior arch of C1 Neurosurgery was consulted, Aspen collar, no surgical intervention, outpatient follow-up in 1 to 2 weeks Patient in intractable pain and was admitted for further management and pain control  Assessment & Plan    Principal Problem:   Cervical spine fracture (HCC) status post mechanical fall -CT head negative for any fracture or bleed.  CT C-spine showed acute nondisplaced fracture at the base of dens, type II, acute nondisplaced fracture of the anterior arch of C1 -Neurosurgery consulted, recommended Aspen collar is all the time, no surgical intervention at this time, outpatient follow-up with neurosurgery 1 to 2 weeks -Currently in 10/ 10 pain, complaining of pain in the right side of the neck and headache.  Feels headache  was not this bad, worried about brain bleed given she is on Eliquis - will repeat CT head without contrast, placed on scheduled Tylenol 1000 mg PO TID, Toradol 30 mg IV x1, increase morphine to 2 mg IV every 2 hours as needed, placed on oxycodone 5 mg every 4 hours as needed for moderate pain.  Continue gabapentin -Placed on bowel regimen, gentle fluid hydration  Active Problems:   Essential hypertension -BP likely elevated due to intractable pain -Resume HCTZ 12.5 mg daily, placed on IV hydralazine with parameters as needed  Chronic atrial fibrillation - followed by Dr. Caryl Comes -Not on any rate control meds or antiarrhythmics  -On Eliquis, currently on hold,  -Follow-up CT head, if no brain bleed, will resume Eliquis in the evening  Vitamin D deficiency -Vitamin D 25-OH 23.0, placed on vitamin D supplement 2000U daily  Prior history of CVA (cerebral vascular accident) ([place]) -Currently no acute neurological deficits, continue statin - will resume Eliquis tonight pending CT head results Addendum: 2:15PM -CT head negative for any bleed. Resume eliquis.  -Patient needs to discuss with her PCP/cards regarding anticoagulation with risk of  falls however at this point she wants to resume her eliquis.      Intractable pain -Please see #1 with medication changes    Hypokalemia -Replaced  Code Status: Full code DVT Prophylaxis:  SCDs Start: 06/21/20 2037   Level of Care: Level of care: Med-Surg Family Communication: Discussed all imaging results, lab results, explained to the patient and patient's son, Samantha Bishop phone # [phone]   Disposition Plan:     Status is: Observation  The patient will require care spanning > 2 midnights and should be moved to inpatient because: Inpatient level of care appropriate due to severity of illness  Dispo: The patient is from: Home              Anticipated d/c is to: SNF              Anticipated d/c date is: 2 days              Patient  currently is not medically stable to d/c.  Intractable pain 10/10, requiring IV pain medications, lives alone, will need skilled nursing facility   Difficult to place patient No      Time Spent in minutes 35 minutes  Procedures:  CT head and C-spine  Consultants:   Neurosurgery  Antimicrobials:   Anti-infectives (From admission, onward)   None          Medications  Scheduled Meds: . acetaminophen  1,000 mg Oral TID  . cholecalciferol  2,000 Units Oral Daily  . fluticasone  2 spray Each Nare Daily  . gabapentin  600 mg Oral TID  . pravastatin  20 mg Oral q1800  . senna  1 tablet Oral BID   Continuous Infusions: . lactated ringers 75 mL/hr at 06/22/20 0940  . [START ON 06/23/2020] lactated ringers     PRN Meds:.acetaminophen **OR** acetaminophen, labetalol, morphine injection, ondansetron **OR** ondansetron (ZOFRAN) IV, oxyCODONE, polyethylene glycol, sodium chloride      Subjective:   Samantha Bishop was seen and examined today.  Anxious, tearful, 10/10 pain, holding her right side of the neck, headache.  Patient denies dizziness, chest pain, shortness of breath, abdominal pain, N/V/D/C.  No focal weakness, no fevers or chills  Objective:   Vitals:   06/22/20 0038 06/22/20 0444 06/22/20 0653 06/22/20 0817  BP: 140/72 (!) 155/80 (!) 164/79 (!) 158/69  Pulse: 78 80 78 71  Resp:  18  17  Temp:  97.9 F (36.6 C)  97.8 F (36.6 C)  TempSrc:  Oral  Oral  SpO2: 94% 95% 98% 93%  Weight:      Height:        Intake/Output Summary (Last 24 hours) at 06/22/2020 1000 Last data filed at 06/22/2020 0900 Gross per 24 hour  Intake 240 ml  Output 300 ml  Net -60 ml     Wt Readings from Last 3 Encounters:  06/21/20 81.6 kg  05/13/20 80.4 kg  03/20/20 80.3 kg     Exam  General: Alert and oriented x 3, very uncomfortable, tearful and anxious, holding her right side of the neck  Cardiovascular: S1 S2 auscultated, no murmurs, RRR  Respiratory: Clear to  auscultation bilaterally, no wheezing, rales or rhonchi  Gastrointestinal: Soft, nontender, nondistended, + bowel sounds  Ext: no pedal edema bilaterally  Neuro: strength 5/5 in upper and lower extremities  Musculoskeletal: No digital cyanosis, clubbing  Skin: No rashes  Psych:  anxious and tearful due to pain   Data Reviewed:  I have personally  reviewed following labs and imaging studies  Micro Results Recent Results (from the past 240 hour(s))  Resp Panel by RT-PCR (Flu A&B, Covid) Nasopharyngeal Swab     Status: None   Collection Time: 06/21/20  7:31 PM   Specimen: Nasopharyngeal Swab; Nasopharyngeal(NP) swabs in vial transport medium  Result Value Ref Range Status   SARS Coronavirus 2 by RT PCR NEGATIVE NEGATIVE Final    Comment: (NOTE) SARS-CoV-2 target nucleic acids are NOT DETECTED.  The SARS-CoV-2 RNA is generally detectable in upper respiratory specimens during the acute phase of infection. The lowest concentration of SARS-CoV-2 viral copies this assay can detect is 138 copies/mL. A negative result does not preclude SARS-Cov-2 infection and should not be used as the sole basis for treatment or other patient management decisions. A negative result may occur with  improper specimen collection/handling, submission of specimen other than nasopharyngeal swab, presence of viral mutation(s) within the areas targeted by this assay, and inadequate number of viral copies(<138 copies/mL). A negative result must be combined with clinical observations, patient history, and epidemiological information. The expected result is Negative.  Fact Sheet for Patients:  EntrepreneurPulse.com.au  Fact Sheet for Healthcare Providers:  IncredibleEmployment.be  This test is no t yet approved or cleared by the [place] FDA and  has been authorized for detection and/or diagnosis of SARS-CoV-2 by FDA under an Emergency Use Authorization (EUA). This  EUA will remain  in effect (meaning this test can be used) for the duration of the COVID-19 declaration under Section 564(b)(1) of the Act, 21 U.S.C.section 360bbb-3(b)(1), unless the authorization is terminated  or revoked sooner.       Influenza A by PCR NEGATIVE NEGATIVE Final   Influenza B by PCR NEGATIVE NEGATIVE Final    Comment: (NOTE) The Xpert Xpress SARS-CoV-2/FLU/RSV plus assay is intended as an aid in the diagnosis of influenza from Nasopharyngeal swab specimens and should not be used as a sole basis for treatment. Nasal washings and aspirates are unacceptable for Xpert Xpress SARS-CoV-2/FLU/RSV testing.  Fact Sheet for Patients: EntrepreneurPulse.com.au  Fact Sheet for Healthcare Providers: IncredibleEmployment.be  This test is not yet approved or cleared by the [place] FDA and has been authorized for detection and/or diagnosis of SARS-CoV-2 by FDA under an Emergency Use Authorization (EUA). This EUA will remain in effect (meaning this test can be used) for the duration of the COVID-19 declaration under Section 564(b)(1) of the Act, 21 U.S.C. section 360bbb-3(b)(1), unless the authorization is terminated or revoked.  Performed at [facility], [place] [address] [place], [place] [address]     Radiology Reports CT Head Wo Contrast  Result Date: 06/21/2020 CLINICAL DATA:  Fall with head trauma.  Anticoagulated. EXAM: CT HEAD WITHOUT CONTRAST CT CERVICAL SPINE WITHOUT CONTRAST TECHNIQUE: Multidetector CT imaging of the head and cervical spine was performed following the standard protocol without intravenous contrast. Multiplanar CT image reconstructions of the cervical spine were also generated. COMPARISON:  Head CT 01/04/2019 FINDINGS: CT HEAD FINDINGS Brain: No abnormality affects the brainstem. Old inferior cerebellar stroke on the left. Old left PCA territory infarction affecting the left occipital lobe. Old right  parietal cortical and subcortical infarction. Chronic small-vessel ischemic changes diffusely throughout the hemispheric white matter. No sign of acute infarction, mass lesion, hemorrhage, hydrocephalus or extra-axial collection. Vascular: There is atherosclerotic calcification of the major vessels at the base of the brain. Skull: Negative.  No skull fracture. Sinuses/Orbits: Clear/normal Other: None CT CERVICAL SPINE FINDINGS Alignment: Normal Skull base and  vertebrae: Acute nondisplaced fracture at the base of the dens, type 2. Acute nondisplaced fracture of the anterior arch of C1. No other regional injury. Soft tissues and spinal canal: No soft tissue finding. Disc levels:  Chronic arthritis at the C1-2 articulation. C2-3: Chronic facet fusion.  No compressive stenosis. C3-4: Facet osteoarthritis. Bulging of the disc. Foraminal stenosis left worse than right. C4-5: Chronic fusion.  No stenosis. C5-6: Facet osteoarthritis on the left. Mild left bony foraminal stenosis. C6-7: Degenerative spondylosis with endplate osteophytes. Facet arthritis on the left. Mild left foraminal narrowing. C7-T1: Chronic facet arthritis.  No significant stenosis. Upper chest: Negative Other: None IMPRESSION: HEAD CT: No acute or traumatic finding. Old infarctions as outlined above. CERVICAL SPINE CT: 1. Acute nondisplaced fracture at the base of the dens, type 2. Acute nondisplaced fracture of the anterior arch of C1. No other regional injury. 2. Chronic degenerative changes as outlined above. 3. These results were called by telephone at the time of interpretation on 06/21/2020 at 7:44 pm to provider [facility] , who verbally acknowledged these results. Electronically Signed   By: Nelson Chimes M.D.   On: 06/21/2020 19:44   CT Cervical Spine Wo Contrast  Result Date: 06/21/2020 CLINICAL DATA:  Fall with head trauma.  Anticoagulated. EXAM: CT HEAD WITHOUT CONTRAST CT CERVICAL SPINE WITHOUT CONTRAST TECHNIQUE: Multidetector CT imaging  of the head and cervical spine was performed following the standard protocol without intravenous contrast. Multiplanar CT image reconstructions of the cervical spine were also generated. COMPARISON:  Head CT 01/04/2019 FINDINGS: CT HEAD FINDINGS Brain: No abnormality affects the brainstem. Old inferior cerebellar stroke on the left. Old left PCA territory infarction affecting the left occipital lobe. Old right parietal cortical and subcortical infarction. Chronic small-vessel ischemic changes diffusely throughout the hemispheric white matter. No sign of acute infarction, mass lesion, hemorrhage, hydrocephalus or extra-axial collection. Vascular: There is atherosclerotic calcification of the major vessels at the base of the brain. Skull: Negative.  No skull fracture. Sinuses/Orbits: Clear/normal Other: None CT CERVICAL SPINE FINDINGS Alignment: Normal Skull base and vertebrae: Acute nondisplaced fracture at the base of the dens, type 2. Acute nondisplaced fracture of the anterior arch of C1. No other regional injury. Soft tissues and spinal canal: No soft tissue finding. Disc levels:  Chronic arthritis at the C1-2 articulation. C2-3: Chronic facet fusion.  No compressive stenosis. C3-4: Facet osteoarthritis. Bulging of the disc. Foraminal stenosis left worse than right. C4-5: Chronic fusion.  No stenosis. C5-6: Facet osteoarthritis on the left. Mild left bony foraminal stenosis. C6-7: Degenerative spondylosis with endplate osteophytes. Facet arthritis on the left. Mild left foraminal narrowing. C7-T1: Chronic facet arthritis.  No significant stenosis. Upper chest: Negative Other: None IMPRESSION: HEAD CT: No acute or traumatic finding. Old infarctions as outlined above. CERVICAL SPINE CT: 1. Acute nondisplaced fracture at the base of the dens, type 2. Acute nondisplaced fracture of the anterior arch of C1. No other regional injury. 2. Chronic degenerative changes as outlined above. 3. These results were called by  telephone at the time of interpretation on 06/21/2020 at 7:44 pm to provider [facility] , who verbally acknowledged these results. Electronically Signed   By: Nelson Chimes M.D.   On: 06/21/2020 19:44   DG Chest Portable 1 View  Result Date: 06/21/2020 CLINICAL DATA:  Fall at home. EXAM: PORTABLE CHEST 1 VIEW COMPARISON:  Radiograph 06/26/2016 FINDINGS: Chronic cardiomegaly, not significantly changed. Stable mediastinal contours with aortic atherosclerosis. No pneumothorax or large pleural effusion. No visualized rib  fracture. No focal airspace disease. Mild interstitial coarsening which is chronic. The bones are under mineralized. IMPRESSION: 1. No evidence of acute traumatic injury to the thorax. 2. Stable cardiomegaly. Electronically Signed   By: Keith Rake M.D.   On: 06/21/2020 19:58    Lab Data:  CBC: Recent Labs  Lab 06/21/20 1910 06/22/20 0211  WBC 9.7 8.7  NEUTROABS 6.9  --   HGB 11.7* 11.0*  HCT 37.1 32.6*  MCV 91.2 88.1  PLT 201 856   Basic Metabolic Panel: Recent Labs  Lab 06/21/20 1910 06/22/20 0211  NA 138 137  K 3.3* 3.4*  CL 103 100  CO2 22 26  GLUCOSE 122* 143*  BUN 11 10  CREATININE 0.55 0.52  CALCIUM 9.2 9.2  MG  --  1.9  PHOS  --  3.7   GFR: Estimated Creatinine Clearance: 47.3 mL/min (by C-G formula based on SCr of 0.52 mg/dL). Liver Function Tests: Recent Labs  Lab 06/22/20 0211  AST 18  ALT 13  ALKPHOS 59  BILITOT 0.9  PROT 6.7  ALBUMIN 3.7   No results for input(s): LIPASE, AMYLASE in the last 168 hours. No results for input(s): AMMONIA in the last 168 hours. Coagulation Profile: Recent Labs  Lab 06/21/20 1910 06/22/20 0211  INR 1.1 1.3*   Cardiac Enzymes: No results for input(s): CKTOTAL, CKMB, CKMBINDEX, TROPONINI in the last 168 hours. BNP (last 3 results) No results for input(s): PROBNP in the last 8760 hours. HbA1C: No results for input(s): HGBA1C in the last 72 hours. CBG: No results for input(s): GLUCAP in the last 168  hours. Lipid Profile: No results for input(s): CHOL, HDL, LDLCALC, TRIG, CHOLHDL, LDLDIRECT in the last 72 hours. Thyroid Function Tests: Recent Labs    06/22/20 0211  TSH 1.093   Anemia Panel: No results for input(s): VITAMINB12, FOLATE, FERRITIN, TIBC, IRON, RETICCTPCT in the last 72 hours. Urine analysis:    Component Value Date/Time   COLORURINE YELLOW 06/26/2016 1536   APPEARANCEUR CLOUDY (A) 06/26/2016 1536   LABSPEC 1.005 06/26/2016 1536   PHURINE 6.0 06/26/2016 1536   GLUCOSEU NEGATIVE 06/26/2016 1536   HGBUR LARGE (A) 06/26/2016 1536   BILIRUBINUR neg 07/15/2016 1649   KETONESUR NEGATIVE 06/26/2016 1536   PROTEINUR 15mg  07/15/2016 1649   PROTEINUR NEGATIVE 06/26/2016 1536   UROBILINOGEN 0.2 07/15/2016 1649   UROBILINOGEN 0.2 08/20/2011 1151   NITRITE neg 07/15/2016 1649   NITRITE POSITIVE (A) 06/26/2016 1536   LEUKOCYTESUR Negative 07/15/2016 1649     Sianna Garofano M.D. [facility] 06/22/2020, 10:00 AM   Call night coverage person covering after 7pm

## 2020-06-22 NOTE — Plan of Care (Signed)
°  Problem: Education: °Goal: Knowledge of General Education information will improve °Description: Including pain rating scale, medication(s)/side effects and non-pharmacologic comfort measures °Outcome: Progressing °  °Problem: Health Behavior/Discharge Planning: °Goal: Ability to manage health-related needs will improve °Outcome: Progressing °  °Problem: Clinical Measurements: °Goal: Ability to maintain clinical measurements within normal limits will improve °Outcome: Progressing °  °Problem: Activity: °Goal: Risk for activity intolerance will decrease °Outcome: Progressing °  °Problem: Nutrition: °Goal: Adequate nutrition will be maintained °Outcome: Progressing °  °Problem: Safety: °Goal: Ability to remain free from injury will improve °Outcome: Progressing °  °

## 2020-06-22 NOTE — Evaluation (Signed)
Occupational Therapy Evaluation Patient Details Name: Samantha Bishop MRN: [id number] DOB: 12/31/1924 Today's Date: 06/22/2020    History of Present Illness 87 y.o. female presenting s/p mechanical fall while raking leaves resulting in acute nondisplaced cervical fx. at base of dens and at anterior arch of C1.   PMHx significant for HTN, DLD, chronic A-fib, Hx of CVA, & Hx of PE.   Clinical Impression   PTA patient was living alone in a single-level private residence with 4 STE and bilateral rails and reports independence with ADLs and use of DME. Patient ambulates in home independently and reports use of SPC intermittently in community dwellings. Patient drives short distances to the grocery store noting son provides transportation for further distances. Patient currently presents with deficits not limited to decreased balance presenting high fall risk on top of history of falls, generalized weakness, and need for Mod to Max A overall for bed mobility and ADLs. Evaluation limited 2/2 10/10 pain in R aspect of cervical neck. Hard collar adjusted with patient noting slight decrease in pain. OT initiated education on cervical precautions but patient would benefit from continued education. Given patient CLOF and deficits, recommendation for SNF rehab. Patient notes previous bad experience at [facility] and does not wish to return there. OT will continue to follow acutely.     Follow Up Recommendations  SNF;Supervision/Assistance - 24 hour    Equipment Recommendations  None recommended by OT (Patient has necessary DME.)    Recommendations for Other Services       Precautions / Restrictions Precautions Precautions: Cervical;Fall Precaution Booklet Issued: Yes (comment) Precaution Comments: Written handout reviewed. Patient would benefit from continued education. Required Braces or Orthoses: Cervical Brace Cervical Brace: Hard collar;At all times Restrictions Weight Bearing Restrictions: No       Mobility Bed Mobility Overal bed mobility: Needs Assistance Bed Mobility: Rolling;Sidelying to Sit;Sit to Supine Rolling: Mod assist Sidelying to sit: Max assist   Sit to supine: Max assist   General bed mobility comments: HOB elevated with cues for log rolling technique. Patient able to initiate advancement of BLE toward EOB but required assist to advance LE from bed surface to EOB. Assist to bring trunk upright with heavy use of bed rails. Max A for return to supine and Max A +2 for supine scoot toward HOB.    Transfers Overall transfer level: Needs assistance   Transfers: Sit to/from Stand Sit to Stand:  (Unable 2/2 pain.)         General transfer comment: Patient attempted sit to stand but unable 2/2 pain and fear of falling despite max cues for sequencing and safety.    Balance Overall balance assessment: Needs assistance Sitting-balance support: Bilateral upper extremity supported;Feet supported Sitting balance-Leahy Scale: Fair Sitting balance - Comments: Able to maintain static sitting balance at EOB without external assist.                                   ADL either performed or assessed with clinical judgement   ADL Overall ADL's : Needs assistance/impaired                     Lower Body Dressing: Maximal assistance;Sitting/lateral leans Lower Body Dressing Details (indicate cue type and reason): Max A to don footwear seated EOB. Patient unable to attain figure-4 position.               General ADL  Comments: Patient greatly limited by pain this date despite premedication.     Vision Baseline Vision/History: Wears glasses Patient Visual Report: No change from baseline Vision Assessment?: No apparent visual deficits     Perception     Praxis      Pertinent Vitals/Pain Pain Assessment: 0-10 Pain Score: 10-Worst pain ever Pain Location: Cervical neck Pain Descriptors / Indicators: Aching;Sharp;Grimacing Pain  Intervention(s): Monitored during session     Hand Dominance Right   Extremity/Trunk Assessment Upper Extremity Assessment Upper Extremity Assessment: Generalized weakness (AROM WFL. Did not formally assess MMT at shoulders 2/2 cervical precautions.)   Lower Extremity Assessment Lower Extremity Assessment: Defer to PT evaluation   Cervical / Trunk Assessment Cervical / Trunk Assessment: Other exceptions Cervical / Trunk Exceptions: s/p C1 and C2 fx.   Communication Communication Communication: HOH   Cognition Arousal/Alertness: Awake/alert Behavior During Therapy: WFL for tasks assessed/performed Overall Cognitive Status: Impaired/Different from baseline Area of Impairment: Memory;Safety/judgement;Awareness                     Memory: Decreased recall of precautions;Decreased short-term memory   Safety/Judgement: Decreased awareness of safety;Decreased awareness of deficits Awareness: Emergent   General Comments: Patient A&Ox4. Patient with short-term memory deficits often repeating herself. Able to follow 1-step verbal commands with increased time.   General Comments  Cervical collar in place but painful. OT adjusted brace for better fit.    Exercises     Shoulder Instructions      Home Living Family/patient expects to be discharged to:: Private residence Living Arrangements: Alone Available Help at Discharge: Family;Friend(s);Available PRN/intermittently Type of Home: House Home Access: Stairs to enter [employer] of Steps: 4 Entrance Stairs-Rails: Right;Left;Can reach both Home Layout: One level     Bathroom Shower/Tub: Teacher, early years/pre: Standard     Home Equipment: Environmental consultant - 4 wheels;Cane - single point;Bedside commode;Shower seat          Prior Functioning/Environment Level of Independence: Independent with assistive device(s)        Comments: Patient reports Mod I with ADLs and use of DME. Patient ambulates in  home without AD but reports using SPC intermittently in community dwellings. Patient drives short-distances to the grocery store. Son provides transportation to MD appointments.        OT Problem List: Decreased strength;Decreased activity tolerance;Impaired balance (sitting and/or standing);Decreased cognition;Decreased safety awareness;Decreased knowledge of use of DME or AE;Decreased knowledge of precautions;Pain      OT Treatment/Interventions: Self-care/ADL training;Therapeutic exercise;Energy conservation;DME and/or AE instruction;Therapeutic activities;Cognitive remediation/compensation;Patient/family education;Balance training    OT Goals(Current goals can be found in the care plan section) Acute Rehab OT Goals Patient Stated Goal: To decrease pain. OT Goal Formulation: With patient Time For Goal Achievement: 07/06/20 Potential to Achieve Goals: Good ADL Goals Pt Will Perform Grooming: with supervision;standing Pt Will Perform Upper Body Dressing: with supervision;sitting Pt Will Perform Lower Body Dressing: with supervision;sit to/from stand Pt Will Transfer to Toilet: with supervision;ambulating Pt Will Perform Toileting - Clothing Manipulation and hygiene: with supervision;sit to/from stand Additional ADL Goal #1: Patient will recall 3/3 cervical precautions demonstrating good adherence during ADLs.  OT Frequency: Min 2X/week   Barriers to D/C: Inaccessible home environment;Decreased caregiver support  Lives alone. 4STE home.       Co-evaluation              AM-PAC OT "6 Clicks" Daily Activity     Outcome Measure Help from another person eating meals?: A  Little Help from another person taking care of personal grooming?: A Little Help from another person toileting, which includes using toliet, bedpan, or urinal?: A Lot Help from another person bathing (including washing, rinsing, drying)?: A Lot Help from another person to put on and taking off regular upper body  clothing?: A Lot Help from another person to put on and taking off regular lower body clothing?: A Lot 6 Click Score: 14   End of Session Equipment Utilized During Treatment: Gait belt;Cervical collar Nurse Communication: Mobility status  Activity Tolerance: No increased pain Patient left: in bed;with call bell/phone within reach;with bed alarm set  OT Visit Diagnosis: Muscle weakness (generalized) (M62.81);History of falling (Z91.81);Other abnormalities of gait and mobility (R26.89);Pain Pain - Right/Left: Right Pain - part of body:  (Cervical neck)                Time: 3838-1840 OT Time Calculation (min): 34 min Charges:  OT General Charges $OT Visit: 1 Visit OT Evaluation $OT Eval Moderate Complexity: 1 Mod OT Treatments $Therapeutic Activity: 8-22 mins  Ellerie Arenz H. OTR/L Supplemental OT, Department of rehab services [phone]  Marceil Welp R H. 06/22/2020, 8:39 AM

## 2020-06-22 NOTE — Discharge Instructions (Signed)

## 2020-06-22 NOTE — Plan of Care (Signed)
Patient has been c/o severe headache since she arrived from the ED. Medicated with prn morphine, tylenol and oxycodone with minimal relief. Provider okay'd for patient to get her flonase early. Problem: Education: Goal: Knowledge of General Education information will improve Description: Including pain rating scale, medication(s)/side effects and non-pharmacologic comfort measures Outcome: Progressing   Problem: Health Behavior/Discharge Planning: Goal: Ability to manage health-related needs will improve Outcome: Progressing   Problem: Clinical Measurements: Goal: Ability to maintain clinical measurements within normal limits will improve Outcome: Progressing Goal: Will remain free from infection Outcome: Progressing Goal: Diagnostic test results will improve Outcome: Progressing Goal: Respiratory complications will improve Outcome: Progressing Goal: Cardiovascular complication will be avoided Outcome: Progressing   Problem: Activity: Goal: Risk for activity intolerance will decrease Outcome: Progressing   Problem: Nutrition: Goal: Adequate nutrition will be maintained Outcome: Progressing   Problem: Coping: Goal: Level of anxiety will decrease Outcome: Progressing   Problem: Elimination: Goal: Will not experience complications related to bowel motility Outcome: Progressing Goal: Will not experience complications related to urinary retention Outcome: Progressing   Problem: Pain Managment: Goal: General experience of comfort will improve Outcome: Progressing   Problem: Safety: Goal: Ability to remain free from injury will improve Outcome: Progressing   Problem: Skin Integrity: Goal: Risk for impaired skin integrity will decrease Outcome: Progressing   Problem: Activity: Goal: Ability to avoid complications of mobility impairment will improve Outcome: Progressing Goal: Ability to tolerate increased activity will improve Outcome: Progressing   Problem:  Education: Goal: Verbalization of understanding the information provided will improve Outcome: Progressing   Problem: Coping: Goal: Level of anxiety will decrease Outcome: Progressing   Problem: Physical Regulation: Goal: Postoperative complications will be avoided or minimized Outcome: Progressing   Problem: Respiratory: Goal: Ability to maintain a clear airway will improve Outcome: Progressing   Problem: Pain Management: Goal: Pain level will decrease Outcome: Progressing   Problem: Skin Integrity: Goal: Signs of wound healing will improve Outcome: Progressing   Problem: Tissue Perfusion: Goal: Ability to maintain adequate tissue perfusion will improve Outcome: Progressing

## 2020-06-22 NOTE — Evaluation (Signed)
Physical Therapy Evaluation Patient Details Name: Samantha Bishop MRN: [id number] DOB: [birth year] Today's Date: 06/22/2020   History of Present Illness  87 y.o. female presenting s/p mechanical fall while raking leaves resulting in acute nondisplaced cervical fx. at base of dens and at anterior arch of C1.   PMHx significant for HTN, DLD, chronic A-fib, Hx of CVA, & Hx of PE.  Clinical Impression   Pt admitted with above diagnosis. Comes from home where she lives alone in a single level house; Independent prior to admission, uses cane out in the community; Presents to PT with considerable pain with transition movements, especially going from sidelying<>sit; Currently needs Moderate assist for bed mobility, transfers and amb with a RW; She is fiercely independent and wants to get home as soon as possible; Needs post-acute rehab to maximize independence and safety with mobility and ADLs prior to going home alone;  Pt currently with functional limitations due to the deficits listed below (see PT Problem List). Pt will benefit from skilled PT to increase their independence and safety with mobility to allow discharge to the venue listed below.       Follow Up Recommendations SNF    Equipment Recommendations  Rolling walker with 5" wheels;3in1 (PT)    Recommendations for Other Services       Precautions / Restrictions Precautions Precautions: Cervical;Fall Precaution Booklet Issued: Yes (comment) Precaution Comments: Written handout reviewed. Patient would benefit from continued education. Required Braces or Orthoses: Cervical Brace Cervical Brace: Hard collar;At all times      Mobility  Bed Mobility Overal bed mobility: Needs Assistance Bed Mobility: Rolling;Sidelying to Sit Rolling: Mod assist Sidelying to sit: Max assist;+2 for safety/equipment       General bed mobility comments: Physical assist for every aspect of rolling and pushing up sidelying to sit; extremly painful at  neck with transition    Transfers Overall transfer level: Needs assistance Equipment used: Rolling walker (2 wheeled) Transfers: Sit to/from Stand Sit to Stand: Mod assist         General transfer comment: Cues to scoot to the edge and keep chest and head upright during transitions; cues for hand lacemetn and safety as well light mod assist to power up  Ambulation/Gait Ambulation/Gait assistance: Min assist;+2 safety/equipment Gait Distance (Feet):  (pivotal stesp bed to [facility], and then BSC to recliner) Assistive device: Rolling walker (2 wheeled)       General Gait Details: Cues to keep RW close  Stairs            Wheelchair Mobility    Modified Rankin (Stroke Patients Only)       Balance     Sitting balance-Leahy Scale: Fair Sitting balance - Comments: Able to maintain static sitting balance at EOB without external assist.     Standing balance-Leahy Scale: Poor                               Pertinent Vitals/Pain Pain Assessment: Faces Faces Pain Scale: Hurts even more Pain Location: Cervical neck, especially during transition sidelying to sit Pain Descriptors / Indicators: Aching;Sharp;Grimacing Pain Intervention(s): Premedicated before session;Monitored during session;Repositioned    Home Living Family/patient expects to be discharged to:: Private residence Living Arrangements: Alone Available Help at Discharge: Family;Friend(s);Available PRN/intermittently Type of Home: House Home Access: Stairs to enter Entrance Stairs-Rails: Right;Left;Can reach both Entrance Stairs-Number of Steps: 4 Home Layout: One level Home Equipment: Walker - 4 wheels;Cane - single  point;Bedside commode;Shower seat      Prior Function Level of Independence: Independent with assistive device(s)         Comments: Patient reports Mod I with ADLs and use of DME. Patient ambulates in home without AD but reports using SPC intermittently in community dwellings.  Patient drives short-distances to the grocery store. Son provides transportation to MD appointments.     Hand Dominance   Dominant Hand: Right    Extremity/Trunk Assessment   Upper Extremity Assessment Upper Extremity Assessment: Defer to OT evaluation    Lower Extremity Assessment Lower Extremity Assessment: Generalized weakness    Cervical / Trunk Assessment Cervical / Trunk Assessment: Other exceptions Cervical / Trunk Exceptions: s/p C1 and C2 fx.  Communication   Communication: HOH  Cognition Arousal/Alertness: Awake/alert Behavior During Therapy: WFL for tasks assessed/performed Overall Cognitive Status: Impaired/Different from baseline Area of Impairment: Memory;Safety/judgement;Awareness                     Memory: Decreased recall of precautions;Decreased short-term memory   Safety/Judgement: Decreased awareness of safety;Decreased awareness of deficits Awareness: Emergent          General Comments General comments (skin integrity, edema, etc.): Lengthy discussion re: short-term SNF for dc planning; pt seems confused re: which places are ALF versus post-acute rehab    Exercises     Assessment/Plan    PT Assessment Patient needs continued PT services  PT Problem List Decreased strength;Decreased activity tolerance;Decreased balance;Decreased mobility;Decreased cognition;Decreased coordination;Decreased knowledge of use of DME;Decreased safety awareness;Decreased knowledge of precautions;Pain       PT Treatment Interventions DME instruction;Gait training;Stair training;Functional mobility training;Therapeutic activities;Therapeutic exercise;Balance training;Patient/family education    PT Goals (Current goals can be found in the Care Plan section)  Acute Rehab PT Goals Patient Stated Goal: To decrease pain. PT Goal Formulation: With patient Time For Goal Achievement: 07/06/20 Potential to Achieve Goals: Good    Frequency Min 2X/week    Barriers to discharge        Co-evaluation               AM-PAC PT "6 Clicks" Mobility  Outcome Measure Help needed turning from your back to your side while in a flat bed without using bedrails?: A Lot Help needed moving from lying on your back to sitting on the side of a flat bed without using bedrails?: A Lot Help needed moving to and from a bed to a chair (including a wheelchair)?: A Lot Help needed standing up from a chair using your arms (e.g., wheelchair or bedside chair)?: A Lot Help needed to walk in hospital room?: A Little Help needed climbing 3-5 steps with a railing? : A Lot 6 Click Score: 13    End of Session Equipment Utilized During Treatment: Gait belt;Cervical collar Activity Tolerance: Patient tolerated treatment well Patient left: in chair;with call bell/phone within reach;Other (comment) Nurse Communication: Mobility status PT Visit Diagnosis: Unsteadiness on feet (R26.81);Other abnormalities of gait and mobility (R26.89);Pain Pain - part of body:  (Neckq)    Time: 6073-7106 PT Time Calculation (min) (ACUTE ONLY): 25 min   Charges:   PT Evaluation $PT Eval Moderate Complexity: 1 Mod PT Treatments $Therapeutic Activity: 8-22 mins        Roney Marion, PT  Acute Rehabilitation Services Pager [phone] Office [phone]   Colletta [place] 06/22/2020, 2:32 PM

## 2020-06-23 DIAGNOSIS — I10 Essential (primary) hypertension: Secondary | ICD-10-CM | POA: Diagnosis not present

## 2020-06-23 DIAGNOSIS — S12000A Unspecified displaced fracture of first cervical vertebra, initial encounter for closed fracture: Secondary | ICD-10-CM | POA: Diagnosis not present

## 2020-06-23 DIAGNOSIS — I480 Paroxysmal atrial fibrillation: Secondary | ICD-10-CM | POA: Diagnosis not present

## 2020-06-23 DIAGNOSIS — W1830XA Fall on same level, unspecified, initial encounter: Secondary | ICD-10-CM | POA: Diagnosis not present

## 2020-06-23 LAB — CBC
HCT: 35.4 % — ABNORMAL LOW (ref 36.0–46.0)
Hemoglobin: 10.9 g/dL — ABNORMAL LOW (ref 12.0–15.0)
MCH: 28.5 pg (ref 26.0–34.0)
MCHC: 30.8 g/dL (ref 30.0–36.0)
MCV: 92.7 fL (ref 80.0–100.0)
Platelets: 177 10*3/uL (ref 150–400)
RBC: 3.82 MIL/uL — ABNORMAL LOW (ref 3.87–5.11)
RDW: 14.1 % (ref 11.5–15.5)
WBC: 9.6 10*3/uL (ref 4.0–10.5)
nRBC: 0 % (ref 0.0–0.2)

## 2020-06-23 LAB — BASIC METABOLIC PANEL
Anion gap: 13 (ref 5–15)
BUN: 15 mg/dL (ref 8–23)
CO2: 23 mmol/L (ref 22–32)
Calcium: 9.2 mg/dL (ref 8.9–10.3)
Chloride: 99 mmol/L (ref 98–111)
Creatinine, Ser: 0.68 mg/dL (ref 0.44–1.00)
GFR, Estimated: >60 mL/min (ref >60)
Glucose, Bld: 107 mg/dL — ABNORMAL HIGH (ref 70–99)
Potassium: 3.9 mmol/L (ref 3.5–5.1)
Sodium: 135 mmol/L (ref 135–145)

## 2020-06-23 MED ORDER — DOCUSATE SODIUM 100 MG PO CAPS
100.0000 mg | ORAL_CAPSULE | Freq: Two times a day (BID) | ORAL | Status: DC
  Administered 2020-06-23 – 2020-06-28 (×11): 100 mg via ORAL
  Filled 2020-06-23 (×12): qty 1

## 2020-06-23 MED ORDER — METHOCARBAMOL 1000 MG/10ML IJ SOLN
500.0000 mg | Freq: Four times a day (QID) | INTRAVENOUS | Status: DC | PRN
  Filled 2020-06-23: qty 5

## 2020-06-23 NOTE — NC FL2 (Signed)
[place] LEVEL OF CARE SCREENING TOOL     IDENTIFICATION  Patient Name: Samantha Bishop Birthdate: August 07, 1924 Sex: female Admission Date (Current Location): 06/21/2020  [facility] and [place] Number:  Herbalist and Address:  [facility] [place] [address], [place], [place] [address]      Provider Number: [id number]  Attending Physician Name and Address:  Mendel Corning, MD  Relative Name and Phone Number:  [place] Aina [facility])   [phone]    Current Level of Care: Hospital Recommended Level of Care: [place] Prior Approval Number:    Date Approved/Denied:   PASRR Number: [id number]  Discharge Plan:      Current Diagnoses: Patient Active Problem List   Diagnosis Date Noted  . Closed cervical spine fracture ([place]) 06/22/2020  . Cervical spine fracture ([place]) 06/21/2020  . Intractable pain 06/21/2020  . Hypokalemia 06/21/2020  . Fall from ground level 06/21/2020  . History of CVA (cerebrovascular accident) 06/09/2019  . Epistaxis 05/30/2019  . Gastritis and gastroduodenitis   . Bleeding 05/29/2019  . Venous stasis ulcer limited to breakdown of skin without varicose veins ([place]) 05/17/2019  . Ulcer of right lower extremity, limited to breakdown of skin ([place]) 05/17/2019  . Acute post-traumatic headache, not intractable 12/21/2018  . Hot flashes 02/09/2017  . Adjustment disorder with mixed anxiety and depressed mood 06/28/2016  . CVA (cerebral vascular accident) ([place]) 06/26/2016  . Right homonymous hemianopsia 06/26/2016  . Back pain 04/08/2015  . Abnormal CT scan 04/08/2015  . Intracranial aneurysm 10/18/2014  . Routine general medical examination at a health care facility 03/08/2014  . Occipital neuralgia 07/16/2013  . Venous insufficiency 07/11/2013  . Alopecia 06/08/2012  . Osteopenia 02/21/2010  . ANXIETY 05/21/2009  . Essential hypertension 05/21/2009  . Atrial fibrillation - followed by Dr. Caryl Comes  03/03/2009  . INTRACRANIAL ANEURYSM 08/16/2007  . Hyperlipidemia 08/15/2007  . GERD 08/15/2007    Orientation RESPIRATION BLADDER Height & Weight     Self,Time,Situation,Place  Normal Continent Weight: 81.6 kg Height:  5\' 6"  (167.6 cm)  BEHAVIORAL SYMPTOMS/MOOD NEUROLOGICAL BOWEL NUTRITION STATUS      Continent Diet (refer to d/c summary)  AMBULATORY STATUS COMMUNICATION OF NEEDS Skin   Extensive Assist Verbally Normal                       Personal Care Assistance Level of Assistance  Bathing,Dressing,Feeding Bathing Assistance: Maximum assistance Feeding assistance: Independent Dressing Assistance: Maximum assistance     Functional Limitations Info  Sight,Hearing,Speech Sight Info: Adequate Hearing Info: Adequate Speech Info: Adequate    SPECIAL CARE FACTORS FREQUENCY  PT (By licensed PT),OT (By licensed OT)     PT Frequency: 5x/ week, evaluate and treat OT Frequency: 5x/ week, evaluate and treat            Contractures Contractures Info: Not present    Additional Factors Info  Code Status,Allergies Code Status Info: Full Code Allergies Info: Codeine, Penicillins           Current Medications (06/23/2020):  This is the current hospital active medication list Current Facility-Administered Medications  Medication Dose Route Frequency Provider Last Rate Last Admin  . acetaminophen (TYLENOL) tablet 650 mg  650 mg Oral Q6H PRN Doran Heater, DO   650 mg at 06/22/20 1941   Or  . acetaminophen (TYLENOL) suppository 650 mg  650 mg Rectal Q6H PRN Doran Heater, DO      .  acetaminophen (TYLENOL) tablet 1,000 mg  1,000 mg Oral TID Rai, Ripudeep K, MD   1,000 mg at 06/23/20 0842  . apixaban (ELIQUIS) tablet 5 mg  5 mg Oral BID Rai, Ripudeep K, MD   5 mg at 06/23/20 0842  . cholecalciferol (VITAMIN D3) tablet 2,000 Units  2,000 Units Oral Daily Rai, Ripudeep K, MD   2,000 Units at 06/23/20 0842  . docusate sodium (COLACE) capsule 100 mg  100 mg Oral BID  Rai, Ripudeep K, MD      . fluticasone (FLONASE) 50 MCG/ACT nasal spray 2 spray  2 spray Each Nare Daily Doran Heater, DO   2 spray at 06/23/20 0847  . gabapentin (NEURONTIN) capsule 600 mg  600 mg Oral TID Doran Heater, DO   600 mg at 06/23/20 0841  . hydrALAZINE (APRESOLINE) injection 10 mg  10 mg Intravenous Q6H PRN Rai, Ripudeep K, MD      . hydrochlorothiazide (HYDRODIURIL) tablet 12.5 mg  12.5 mg Oral Daily Rai, Ripudeep K, MD   12.5 mg at 06/23/20 0841  . labetalol (NORMODYNE) injection 5 mg  5 mg Intravenous Q2H PRN Doran Heater, DO   5 mg at 06/23/20 1962  . lactated ringers infusion   Intravenous Continuous Rai, Ripudeep K, MD 10 mL/hr at 06/23/20 1305 Rate Change at 06/23/20 1305  . LORazepam (ATIVAN) injection 0.5 mg  0.5 mg Intravenous Q8H PRN Rai, Ripudeep K, MD   0.5 mg at 06/23/20 1354  . methocarbamol (ROBAXIN) 500 mg in dextrose 5 % 50 mL IVPB  500 mg Intravenous Q6H PRN Rai, Ripudeep K, MD      . morphine 2 MG/ML injection 2 mg  2 mg Intravenous Q2H PRN Rai, Ripudeep K, MD   2 mg at 06/23/20 1221  . ondansetron (ZOFRAN) tablet 4 mg  4 mg Oral Q6H PRN Doran Heater, DO       Or  . ondansetron (ZOFRAN) injection 4 mg  4 mg Intravenous Q6H PRN MacNeil, Richard G, DO      . oxyCODONE (Oxy IR/ROXICODONE) immediate release tablet 5 mg  5 mg Oral Q4H PRN Doran Heater, DO   5 mg at 06/23/20 1354  . polyethylene glycol (MIRALAX / GLYCOLAX) packet 17 g  17 g Oral Daily PRN Luna Fuse, Richard G, DO      . pravastatin (PRAVACHOL) tablet 20 mg  20 mg Oral q1800 MacNeil, Richard G, DO   20 mg at 06/22/20 1711  . senna (SENOKOT) tablet 8.6 mg  1 tablet Oral BID Doran Heater, DO   8.6 mg at 06/23/20 2297  . sodium chloride (OCEAN) 0.65 % nasal spray 1 spray  1 spray Each Nare BID PRN Doran Heater, DO         Discharge Medications: Please see discharge summary for a list of discharge medications.  Relevant Imaging Results:  Relevant Lab  Results:   Additional Information SSN:  [id number]  Sharin Mons, RN

## 2020-06-23 NOTE — Plan of Care (Signed)
  Problem: Activity: Goal: Risk for activity intolerance will decrease Outcome: Progressing   Problem: Pain Managment: Goal: General experience of comfort will improve Outcome: Not Progressing

## 2020-06-23 NOTE — TOC Initial Note (Addendum)
Transition of Care [facility]) - Initial/Assessment Note    Patient Details  Name: Samantha Bishop MRN: [id number] Date of Birth: 1924/09/09  Transition of Care [facility]) CM/SW Contact:    Sharin Mons, RN Phone Number: 06/23/2020, 2:34 PM  Clinical Narrative:     Admitted s/p a fall. Suffered acute cervical fractures. From home alone. PTA independent with ADL's owns cane, walker. Supportive family.     RNCM received consult for possible SNF placement at time of discharge. RNCM spoke with patient regarding PT recommendation of SNF placement at time of discharge. Patient reported that she is currently unable to care for self independently at her home given her current physical needs and fall risk. Patient expressed understanding of PT recommendation and is agreeable to SNF placement at time of discharge. Patient reports no preference for SNF. RNCM discussed insurance authorization process and provided Medicare SNF ratings list. Patient expressed being hopeful for rehab and to feel better soon. No further questions reported at this time. RNCM to continue to follow and assist with discharge planning needs.  Pt COVID vaccinated.  SNF referrals fxed out for SNF bed offers. Expected Discharge Plan: [place] (from home alone) Barriers to Discharge: Continued Medical Work up   Patient Goals and CMS Choice   CMS Medicare.gov Compare Post Acute Care list provided to:: Patient    Expected Discharge Plan and Services Expected Discharge Plan: [place] (from home alone)   Discharge Planning Services: CM Consult   Living arrangements for the past 2 months: Single Family Home                                      Prior Living Arrangements/Services Living arrangements for the past 2 months: Single Family Home Lives with:: Self Patient language and need for interpreter reviewed:: Yes Do you feel safe going back to the place where you live?: Yes      Need  for Family Participation in Patient Care: Yes (Comment) Care giver support system in place?: Yes (comment)   Criminal Activity/Legal Involvement Pertinent to Current Situation/Hospitalization: No - Comment as needed  Activities of Daily Living Home Assistive Devices/Equipment: C-collar ADL Screening (condition at time of admission) Patient's cognitive ability adequate to safely complete daily activities?: Yes Is the patient deaf or have difficulty hearing?: No Does the patient have difficulty seeing, even when wearing glasses/contacts?: No Does the patient have difficulty concentrating, remembering, or making decisions?: No Patient able to express need for assistance with ADLs?: Yes Does the patient have difficulty dressing or bathing?: Yes Independently performs ADLs?: Yes (appropriate for developmental age) Does the patient have difficulty walking or climbing stairs?: No Weakness of Legs: None Weakness of Arms/Hands: None  Permission Sought/Granted   Permission granted to share information with : Yes, Verbal Permission Granted  Share Information with NAME: [name] (Son) [phone]           Emotional Assessment Appearance:: Appears stated age Attitude/Demeanor/Rapport: Gracious Affect (typically observed): Accepting Orientation: : Oriented to Self,Oriented to Place,Oriented to  Time,Oriented to Situation Alcohol / Substance Use: Not Applicable Psych Involvement: No (comment)  Admission diagnosis:  Cervical spine fracture ([place]) [S12.9XXA] Fall, initial encounter [id number]] Closed cervical spine fracture ([place]) [S12.9XXA] Patient Active Problem List   Diagnosis Date Noted  . Closed cervical spine fracture ([place]) 06/22/2020  . Cervical spine fracture ([place]) 06/21/2020  . Intractable pain 06/21/2020  . Hypokalemia  06/21/2020  . Fall from ground level 06/21/2020  . History of CVA (cerebrovascular accident) 06/09/2019  . Epistaxis 05/30/2019  . Gastritis and  gastroduodenitis   . Bleeding 05/29/2019  . Venous stasis ulcer limited to breakdown of skin without varicose veins ([place]) 05/17/2019  . Ulcer of right lower extremity, limited to breakdown of skin ([place]) 05/17/2019  . Acute post-traumatic headache, not intractable 12/21/2018  . Hot flashes 02/09/2017  . Adjustment disorder with mixed anxiety and depressed mood 06/28/2016  . CVA (cerebral vascular accident) ([place]) 06/26/2016  . Right homonymous hemianopsia 06/26/2016  . Back pain 04/08/2015  . Abnormal CT scan 04/08/2015  . Intracranial aneurysm 10/18/2014  . Routine general medical examination at a health care facility 03/08/2014  . Occipital neuralgia 07/16/2013  . Venous insufficiency 07/11/2013  . Alopecia 06/08/2012  . Osteopenia 02/21/2010  . ANXIETY 05/21/2009  . Essential hypertension 05/21/2009  . Atrial fibrillation - followed by Dr. Caryl Comes 03/03/2009  . INTRACRANIAL ANEURYSM 08/16/2007  . Hyperlipidemia 08/15/2007  . GERD 08/15/2007   PCP:  Hoyt Koch, MD Pharmacy:   [place], [place] - [address] [place] [address] Phone: [phone] Fax: [place], [place] - [address] [place] [address] Phone: [phone] Fax: [phone]  [id number] - [place], [place] [address] [place] [address] Phone: [phone] Fax: [phone]     Social Determinants of Health (SDOH) Interventions    Readmission Risk Interventions No flowsheet data found.

## 2020-06-23 NOTE — Progress Notes (Addendum)
[facility]                                                                              Patient Demographics  Samantha Bishop, is a 87 y.o. female, DOB - [birth year], ZOX:[id number]  Admit date - 06/21/2020   Admitting Physician Doran Heater, DO  Outpatient Primary MD for the patient is Hoyt Koch, MD  Outpatient specialists:   LOS - 1  days   Medical records reviewed and are as summarized below:    Chief Complaint  Patient presents with  . Fall       Brief summary   Patient is a 87 year old female with hypertension, dyslipidemia, chronic A. fib on Eliquis, previous CVA, H/O PE, who presented from home on 06/21/2020 due to fall. Patient was raking leaves just prior to presentation when she tripped fell, and hitting the back of her head on the ground.  She describes the fall as mechanical.  Did not have any loss of consciousness.  She had excruciating pain and could not ambulate afterwards.  Pain is located in the back of her neck, nonradiating. Lives alone, uses cane for ambulation.  CT of cervical spine demonstrates acute nondisplaced fracture at the base of the dens, and acute nondisplaced fracture of the anterior arch of C1 Neurosurgery was consulted, Aspen collar, no surgical intervention, outpatient follow-up in 1 to 2 weeks Patient in intractable pain and was admitted for further management and pain control  Assessment & Plan    Principal Problem:   Cervical spine fracture (HCC) status post mechanical fall -CT head negative for any fracture or bleed.  CT C-spine showed acute nondisplaced fracture at the base of dens, type II, acute nondisplaced fracture of the anterior arch of C1 -Neurosurgery consulted, recommended Aspen collar all the time, no surgical intervention at this time, outpatient follow-up with neurosurgery 1 to 2 weeks -Repeat CT head on 2/13 neg for any bleed, Eliquis was resumed -Still in significant pain, continue current  regimen scheduled Tylenol 1000 mg PO TID, morphine IV, oxycodone 5 mg every 4 hours as needed for moderate pain.   -Continue gabapentin, also added Robaxin -Placed on bowel regimen - DEXA scan outpatient   Active Problems:   Essential hypertension -BP likely elevated due to intractable pain -Continue HCTZ, IV hydralazine as needed with parameters   Chronic atrial fibrillation - followed by Dr. Caryl Comes -Not on any rate control meds or antiarrhythmics  -Follow-up CT head on 2/13 showed no bleeding, Eliquis resumed  Vitamin D deficiency -Vitamin D 25-OH 23.0, continue vitamin D supplement 2000 units daily   Prior history of CVA (cerebral vascular accident) ([place]) -Currently no acute neurological deficits, continue statin and eliquis -Patient needs to discuss with her PCP/cards regarding anticoagulation with risk of falls however at this point she wants to resume her eliquis.   Code Status: Full code DVT Prophylaxis:  SCDs Start: 06/21/20 2037 apixaban (ELIQUIS) tablet 5 mg   Level of Care: Level of care: Med-Surg Family Communication: Discussed all imaging results, lab results, explained to the patient and patient's son, Shaylee Stanislawski phone # [phone] on 2/13 and 2/14 today  Disposition Plan:     Status is: inpatient  The patient will require care spanning > 2 midnights because: Inpatient level of care appropriate due to severity of illness  Dispo: The patient is from: Home              Anticipated d/c is to: SNF              Anticipated d/c date is: 2 days              Patient currently is not medically stable to d/c.  Still having significant pain, needs skilled nursing facility, unsafe to go home   Difficult to place patient No      Time Spent in minutes 35 minutes  Procedures:  CT head and C-spine  Consultants:   Neurosurgery  Antimicrobials:   Anti-infectives (From admission, onward)   None         Medications  Scheduled Meds: . acetaminophen   1,000 mg Oral TID  . apixaban  5 mg Oral BID  . cholecalciferol  2,000 Units Oral Daily  . fluticasone  2 spray Each Nare Daily  . gabapentin  600 mg Oral TID  . hydrochlorothiazide  12.5 mg Oral Daily  . pravastatin  20 mg Oral q1800  . senna  1 tablet Oral BID   Continuous Infusions: . lactated ringers 10 mL/hr at 06/23/20 1305  . methocarbamol (ROBAXIN) IV     PRN Meds:.acetaminophen **OR** acetaminophen, hydrALAZINE, labetalol, LORazepam, methocarbamol (ROBAXIN) IV, morphine injection, ondansetron **OR** ondansetron (ZOFRAN) IV, oxyCODONE, polyethylene glycol, sodium chloride      Subjective:   Sameerah Nachtigal was seen and examined today.  States pain was unbearable 10/10 after she was moved up in the bed by the staff.  No nausea or vomiting.  Appears very anxious.  Patient denies dizziness, chest pain, shortness of breath, abdominal pain, N/V/D/C.  No focal weakness Objective:   Vitals:   06/22/20 1613 06/22/20 1959 06/23/20 0756 06/23/20 0928  BP: (!) 143/74 139/72 (!) 183/100 (!) 158/71  Pulse: 63 84 90 71  Resp: 17 18 18    Temp: 97.9 F (36.6 C) 98.8 F (37.1 C) 98.2 F (36.8 C) (!) 97.5 F (36.4 C)  TempSrc: Oral Oral Oral Oral  SpO2: 95% 95% 90% 96%  Weight:      Height:        Intake/Output Summary (Last 24 hours) at 06/23/2020 1337 Last data filed at 06/23/2020 1100 Gross per 24 hour  Intake 619.97 ml  Output 800 ml  Net -180.03 ml     Wt Readings from Last 3 Encounters:  06/21/20 81.6 kg  05/13/20 80.4 kg  03/20/20 80.3 kg   Physical Exam  General: Alert and oriented x 3, still very uncomfortable due to pain holding her neck  Cardiovascular: S1 S2 clear, RRR. No pedal edema b/l  Respiratory: CTAB, no wheezing, rales or rhonchi  Gastrointestinal: Soft, nontender, nondistended, NBS  Ext: no pedal edema bilaterally  Neuro: no new deficits  Musculoskeletal: No cyanosis, clubbing  Skin: No rashes  Psych: anxious and uncomfortable    Data  Reviewed:  I have personally reviewed following labs and imaging studies  Micro Results Recent Results (from the past 240 hour(s))  Resp Panel by RT-PCR (Flu A&B, Covid) Nasopharyngeal Swab     Status: None   Collection Time: 06/21/20  7:31 PM   Specimen: Nasopharyngeal Swab; Nasopharyngeal(NP) swabs in vial transport medium  Result Value Ref Range Status   SARS Coronavirus 2  by RT PCR NEGATIVE NEGATIVE Final    Comment: (NOTE) SARS-CoV-2 target nucleic acids are NOT DETECTED.  The SARS-CoV-2 RNA is generally detectable in upper respiratory specimens during the acute phase of infection. The lowest concentration of SARS-CoV-2 viral copies this assay can detect is 138 copies/mL. A negative result does not preclude SARS-Cov-2 infection and should not be used as the sole basis for treatment or other patient management decisions. A negative result may occur with  improper specimen collection/handling, submission of specimen other than nasopharyngeal swab, presence of viral mutation(s) within the areas targeted by this assay, and inadequate number of viral copies(<138 copies/mL). A negative result must be combined with clinical observations, patient history, and epidemiological information. The expected result is Negative.  Fact Sheet for Patients:  EntrepreneurPulse.com.au  Fact Sheet for Healthcare Providers:  IncredibleEmployment.be  This test is no t yet approved or cleared by the [place] FDA and  has been authorized for detection and/or diagnosis of SARS-CoV-2 by FDA under an Emergency Use Authorization (EUA). This EUA will remain  in effect (meaning this test can be used) for the duration of the COVID-19 declaration under Section 564(b)(1) of the Act, 21 U.S.C.section 360bbb-3(b)(1), unless the authorization is terminated  or revoked sooner.       Influenza A by PCR NEGATIVE NEGATIVE Final   Influenza B by PCR NEGATIVE NEGATIVE Final     Comment: (NOTE) The Xpert Xpress SARS-CoV-2/FLU/RSV plus assay is intended as an aid in the diagnosis of influenza from Nasopharyngeal swab specimens and should not be used as a sole basis for treatment. Nasal washings and aspirates are unacceptable for Xpert Xpress SARS-CoV-2/FLU/RSV testing.  Fact Sheet for Patients: EntrepreneurPulse.com.au  Fact Sheet for Healthcare Providers: IncredibleEmployment.be  This test is not yet approved or cleared by the [place] FDA and has been authorized for detection and/or diagnosis of SARS-CoV-2 by FDA under an Emergency Use Authorization (EUA). This EUA will remain in effect (meaning this test can be used) for the duration of the COVID-19 declaration under Section 564(b)(1) of the Act, 21 U.S.C. section 360bbb-3(b)(1), unless the authorization is terminated or revoked.  Performed at [facility], [place] [address], [place], [place] [address]     Radiology Reports CT HEAD WO CONTRAST  Result Date: 06/22/2020 CLINICAL DATA:  Poly trauma, critical, head/cervical spine injury suspected. Severe headache, fall. EXAM: CT HEAD WITHOUT CONTRAST TECHNIQUE: Contiguous axial images were obtained from the base of the skull through the vertex without intravenous contrast. COMPARISON:  Noncontrast head CT 06/21/2020 FINDINGS: Brain: Mild cerebral atrophy. Redemonstrated chronic cortical/subcortical infarcts within the right parietal lobe and left occipital lobes. Redemonstrated small chronic infarct within the left cerebellar hemisphere. Patchy and ill-defined hypoattenuation within the cerebral white matter is nonspecific, but compatible with chronic small vessel ischemic disease. There is no acute intracranial hemorrhage. No acute demarcated cortical infarct. No extra-axial fluid collection. No evidence of intracranial mass. No midline shift. Vascular: No hyperdense vessel.  Atherosclerotic calcifications. Skull:  Normal. Negative for fracture or focal lesion. Sinuses/Orbits: Visualized orbits show no acute finding. Mild scattered paranasal sinus mucosal thickening. Left maxillary sinus mucous retention cyst. Other: Known acute type 2 dens fracture incompletely assessed. Known acute fracture of the C1 anterior arch. IMPRESSION: No evidence of acute intracranial abnormality. Redemonstrated generalized atrophy, chronic small vessel ischemic disease and chronic infarcts as described. Known acute type 2 dens fracture and acute fracture of the C1 anterior arch. Paranasal sinus disease as described. Electronically Signed   By:  Kellie Simmering DO   On: 06/22/2020 12:09   CT Head Wo Contrast  Result Date: 06/21/2020 CLINICAL DATA:  Fall with head trauma.  Anticoagulated. EXAM: CT HEAD WITHOUT CONTRAST CT CERVICAL SPINE WITHOUT CONTRAST TECHNIQUE: Multidetector CT imaging of the head and cervical spine was performed following the standard protocol without intravenous contrast. Multiplanar CT image reconstructions of the cervical spine were also generated. COMPARISON:  Head CT 01/04/2019 FINDINGS: CT HEAD FINDINGS Brain: No abnormality affects the brainstem. Old inferior cerebellar stroke on the left. Old left PCA territory infarction affecting the left occipital lobe. Old right parietal cortical and subcortical infarction. Chronic small-vessel ischemic changes diffusely throughout the hemispheric white matter. No sign of acute infarction, mass lesion, hemorrhage, hydrocephalus or extra-axial collection. Vascular: There is atherosclerotic calcification of the major vessels at the base of the brain. Skull: Negative.  No skull fracture. Sinuses/Orbits: Clear/normal Other: None CT CERVICAL SPINE FINDINGS Alignment: Normal Skull base and vertebrae: Acute nondisplaced fracture at the base of the dens, type 2. Acute nondisplaced fracture of the anterior arch of C1. No other regional injury. Soft tissues and spinal canal: No soft tissue  finding. Disc levels:  Chronic arthritis at the C1-2 articulation. C2-3: Chronic facet fusion.  No compressive stenosis. C3-4: Facet osteoarthritis. Bulging of the disc. Foraminal stenosis left worse than right. C4-5: Chronic fusion.  No stenosis. C5-6: Facet osteoarthritis on the left. Mild left bony foraminal stenosis. C6-7: Degenerative spondylosis with endplate osteophytes. Facet arthritis on the left. Mild left foraminal narrowing. C7-T1: Chronic facet arthritis.  No significant stenosis. Upper chest: Negative Other: None IMPRESSION: HEAD CT: No acute or traumatic finding. Old infarctions as outlined above. CERVICAL SPINE CT: 1. Acute nondisplaced fracture at the base of the dens, type 2. Acute nondisplaced fracture of the anterior arch of C1. No other regional injury. 2. Chronic degenerative changes as outlined above. 3. These results were called by telephone at the time of interpretation on 06/21/2020 at 7:44 pm to provider [facility] , who verbally acknowledged these results. Electronically Signed   By: Nelson Chimes M.D.   On: 06/21/2020 19:44   CT Cervical Spine Wo Contrast  Result Date: 06/21/2020 CLINICAL DATA:  Fall with head trauma.  Anticoagulated. EXAM: CT HEAD WITHOUT CONTRAST CT CERVICAL SPINE WITHOUT CONTRAST TECHNIQUE: Multidetector CT imaging of the head and cervical spine was performed following the standard protocol without intravenous contrast. Multiplanar CT image reconstructions of the cervical spine were also generated. COMPARISON:  Head CT 01/04/2019 FINDINGS: CT HEAD FINDINGS Brain: No abnormality affects the brainstem. Old inferior cerebellar stroke on the left. Old left PCA territory infarction affecting the left occipital lobe. Old right parietal cortical and subcortical infarction. Chronic small-vessel ischemic changes diffusely throughout the hemispheric white matter. No sign of acute infarction, mass lesion, hemorrhage, hydrocephalus or extra-axial collection. Vascular: There is  atherosclerotic calcification of the major vessels at the base of the brain. Skull: Negative.  No skull fracture. Sinuses/Orbits: Clear/normal Other: None CT CERVICAL SPINE FINDINGS Alignment: Normal Skull base and vertebrae: Acute nondisplaced fracture at the base of the dens, type 2. Acute nondisplaced fracture of the anterior arch of C1. No other regional injury. Soft tissues and spinal canal: No soft tissue finding. Disc levels:  Chronic arthritis at the C1-2 articulation. C2-3: Chronic facet fusion.  No compressive stenosis. C3-4: Facet osteoarthritis. Bulging of the disc. Foraminal stenosis left worse than right. C4-5: Chronic fusion.  No stenosis. C5-6: Facet osteoarthritis on the left. Mild left bony foraminal stenosis. C6-7:  Degenerative spondylosis with endplate osteophytes. Facet arthritis on the left. Mild left foraminal narrowing. C7-T1: Chronic facet arthritis.  No significant stenosis. Upper chest: Negative Other: None IMPRESSION: HEAD CT: No acute or traumatic finding. Old infarctions as outlined above. CERVICAL SPINE CT: 1. Acute nondisplaced fracture at the base of the dens, type 2. Acute nondisplaced fracture of the anterior arch of C1. No other regional injury. 2. Chronic degenerative changes as outlined above. 3. These results were called by telephone at the time of interpretation on 06/21/2020 at 7:44 pm to provider [facility] , who verbally acknowledged these results. Electronically Signed   By: Nelson Chimes M.D.   On: 06/21/2020 19:44   DG Chest Portable 1 View  Result Date: 06/21/2020 CLINICAL DATA:  Fall at home. EXAM: PORTABLE CHEST 1 VIEW COMPARISON:  Radiograph 06/26/2016 FINDINGS: Chronic cardiomegaly, not significantly changed. Stable mediastinal contours with aortic atherosclerosis. No pneumothorax or large pleural effusion. No visualized rib fracture. No focal airspace disease. Mild interstitial coarsening which is chronic. The bones are under mineralized. IMPRESSION: 1. No  evidence of acute traumatic injury to the thorax. 2. Stable cardiomegaly. Electronically Signed   By: Keith Rake M.D.   On: 06/21/2020 19:58    Lab Data:  CBC: Recent Labs  Lab 06/21/20 1910 06/22/20 0211 06/23/20 0403  WBC 9.7 8.7 9.6  NEUTROABS 6.9  --   --   HGB 11.7* 11.0* 10.9*  HCT 37.1 32.6* 35.4*  MCV 91.2 88.1 92.7  PLT 201 185 562   Basic Metabolic Panel: Recent Labs  Lab 06/21/20 1910 06/22/20 0211 06/23/20 0403  NA 138 137 135  K 3.3* 3.4* 3.9  CL 103 100 99  CO2 22 26 23   GLUCOSE 122* 143* 107*  BUN 11 10 15   CREATININE 0.55 0.52 0.68  CALCIUM 9.2 9.2 9.2  MG  --  1.9  --   PHOS  --  3.7  --    GFR: Estimated Creatinine Clearance: 47.3 mL/min (by C-G formula based on SCr of 0.68 mg/dL). Liver Function Tests: Recent Labs  Lab 06/22/20 0211  AST 18  ALT 13  ALKPHOS 59  BILITOT 0.9  PROT 6.7  ALBUMIN 3.7   No results for input(s): LIPASE, AMYLASE in the last 168 hours. No results for input(s): AMMONIA in the last 168 hours. Coagulation Profile: Recent Labs  Lab 06/21/20 1910 06/22/20 0211  INR 1.1 1.3*   Cardiac Enzymes: No results for input(s): CKTOTAL, CKMB, CKMBINDEX, TROPONINI in the last 168 hours. BNP (last 3 results) No results for input(s): PROBNP in the last 8760 hours. HbA1C: No results for input(s): HGBA1C in the last 72 hours. CBG: No results for input(s): GLUCAP in the last 168 hours. Lipid Profile: No results for input(s): CHOL, HDL, LDLCALC, TRIG, CHOLHDL, LDLDIRECT in the last 72 hours. Thyroid Function Tests: Recent Labs    06/22/20 0211  TSH 1.093   Anemia Panel: No results for input(s): VITAMINB12, FOLATE, FERRITIN, TIBC, IRON, RETICCTPCT in the last 72 hours. Urine analysis:    Component Value Date/Time   COLORURINE YELLOW 06/26/2016 1536   APPEARANCEUR CLOUDY (A) 06/26/2016 1536   LABSPEC 1.005 06/26/2016 1536   PHURINE 6.0 06/26/2016 1536   GLUCOSEU NEGATIVE 06/26/2016 1536   HGBUR LARGE (A)  06/26/2016 1536   BILIRUBINUR neg 07/15/2016 1649   KETONESUR NEGATIVE 06/26/2016 1536   PROTEINUR 15mg  07/15/2016 1649   PROTEINUR NEGATIVE 06/26/2016 1536   UROBILINOGEN 0.2 07/15/2016 1649   UROBILINOGEN 0.2 08/20/2011 1151  NITRITE neg 07/15/2016 1649   NITRITE POSITIVE (A) 06/26/2016 1536   LEUKOCYTESUR Negative 07/15/2016 1649     Naveah Brave M.D. [facility] 06/23/2020, 1:37 PM   Call night coverage person covering after 7pm

## 2020-06-23 NOTE — Progress Notes (Signed)
Physical Therapy Treatment Patient Details Name: Samantha Bishop MRN: [id number] DOB: 1924/11/25 Today's Date: 06/23/2020    History of Present Illness 87 y.o. female presenting s/p mechanical fall while raking leaves resulting in acute nondisplaced cervical fx. at base of dens and at anterior arch of C1.   PMHx significant for HTN, DLD, chronic A-fib, Hx of CVA, & Hx of PE.    PT Comments    Continuing work on functional mobility and activity tolerance;  Able to get up to EOB with less pain today (HOB elevated more than last session); Still needing light mod assist for transfers and safety with amb, but can safely assist with one person, with less need for 2 person assist;   Continue to recommend SNF for post-acute rehab to maximize independence and safety with mobility; at this point with Cervical collar and restrictions, as well as the amount of pain she has with transitions, to go home she would need 24 hour assist  Follow Up Recommendations  SNF     Equipment Recommendations  Rolling walker with 5" wheels;3in1 (PT)    Recommendations for Other Services       Precautions / Restrictions Precautions Precautions: Cervical;Fall Precaution Booklet Issued: Yes (comment) Required Braces or Orthoses: Cervical Brace Cervical Brace: Hard collar;At all times Restrictions Weight Bearing Restrictions: No    Mobility  Bed Mobility Overal bed mobility: Needs Assistance Bed Mobility: Rolling;Sidelying to Sit Rolling: Mod assist Sidelying to sit: Mod assist;HOB elevated (HOB quite elevated, past 30 deg)       General bed mobility comments: Opted to start today's session with [facility] very elevated to minimize distance to getting fully upright; Initiated by moving feet to EOB, then light mod assist to semi-roll as her feet cleared the bed; Mod assist to push up to sit; still painful, but less so than yesterday    Transfers Overall transfer level: Needs assistance Equipment used: Rolling  walker (2 wheeled) Transfers: Sit to/from Stand Sit to Stand: Mod assist         General transfer comment: Cues to scoot to the edge and keep chest and head upright during transitions; cues for hand lacemetn and safety as well light mod assist to power up  Ambulation/Gait Ambulation/Gait assistance: Min assist Gait Distance (Feet):  (pivotal steps bed to [facility] then to recliner) Assistive device: Rolling walker (2 wheeled)       General Gait Details: Better at keeping RW proximity today; cues to self-monitor for activity tolerance   Stairs             Wheelchair Mobility    Modified Rankin (Stroke Patients Only)       Balance     Sitting balance-Leahy Scale: Fair       Standing balance-Leahy Scale: Poor                              Cognition Arousal/Alertness: Awake/alert Behavior During Therapy: WFL for tasks assessed/performed Overall Cognitive Status: Impaired/Different from baseline Area of Impairment: Memory;Safety/judgement                     Memory: Decreased recall of precautions;Decreased short-term memory   Safety/Judgement: Decreased awareness of safety;Decreased awareness of deficits     General Comments: Noting she is unrealistic about returning home without 24 hour assist      Exercises      General Comments General comments (skin integrity, edema, etc.): Noted she  tends to void when she stands      Pertinent Vitals/Pain Pain Assessment: Faces Faces Pain Scale: Hurts even more Pain Location: Cervical neck, especially during transition movements Pain Descriptors / Indicators: Aching;Sharp;Grimacing Pain Intervention(s): Premedicated before session    Home Living                      Prior Function            PT Goals (current goals can now be found in the care plan section) Acute Rehab PT Goals Patient Stated Goal: To decrease pain; wants to get home PT Goal Formulation: With patient Time For  Goal Achievement: 07/06/20 Potential to Achieve Goals: Good Progress towards PT goals: Progressing toward goals (showing better activity tolerance)    Frequency    Min 2X/week      PT Plan Current plan remains appropriate    Co-evaluation              AM-PAC PT "6 Clicks" Mobility   Outcome Measure  Help needed turning from your back to your side while in a flat bed without using bedrails?: A Lot Help needed moving from lying on your back to sitting on the side of a flat bed without using bedrails?: A Lot Help needed moving to and from a bed to a chair (including a wheelchair)?: A Lot Help needed standing up from a chair using your arms (e.g., wheelchair or bedside chair)?: A Lot Help needed to walk in hospital room?: A Little Help needed climbing 3-5 steps with a railing? : A Little 6 Click Score: 14    End of Session Equipment Utilized During Treatment: Gait belt;Cervical collar Activity Tolerance: Patient tolerated treatment well Patient left: in chair;with call bell/phone within reach;Other (comment) (eating breakfast)   PT Visit Diagnosis: Unsteadiness on feet (R26.81);Other abnormalities of gait and mobility (R26.89);Pain Pain - part of body:  (Neck)     Time: 8115-7262 PT Time Calculation (min) (ACUTE ONLY): 21 min  Charges:  $Therapeutic Activity: 8-22 mins                     Roney Marion, PT  Acute Rehabilitation Services Pager [phone] Office [place] 06/23/2020, 12:12 PM

## 2020-06-24 DIAGNOSIS — I480 Paroxysmal atrial fibrillation: Secondary | ICD-10-CM | POA: Diagnosis not present

## 2020-06-24 DIAGNOSIS — I6300 Cerebral infarction due to thrombosis of unspecified precerebral artery: Secondary | ICD-10-CM

## 2020-06-24 DIAGNOSIS — S12000A Unspecified displaced fracture of first cervical vertebra, initial encounter for closed fracture: Secondary | ICD-10-CM | POA: Diagnosis not present

## 2020-06-24 DIAGNOSIS — I10 Essential (primary) hypertension: Secondary | ICD-10-CM | POA: Diagnosis not present

## 2020-06-24 LAB — BASIC METABOLIC PANEL
Anion gap: 8 (ref 5–15)
BUN: 11 mg/dL (ref 8–23)
CO2: 27 mmol/L (ref 22–32)
Calcium: 9.1 mg/dL (ref 8.9–10.3)
Chloride: 99 mmol/L (ref 98–111)
Creatinine, Ser: 0.56 mg/dL (ref 0.44–1.00)
GFR, Estimated: >60 mL/min (ref >60)
Glucose, Bld: 125 mg/dL — ABNORMAL HIGH (ref 70–99)
Potassium: 3.6 mmol/L (ref 3.5–5.1)
Sodium: 134 mmol/L — ABNORMAL LOW (ref 135–145)

## 2020-06-24 MED ORDER — BISACODYL 10 MG RE SUPP
10.0000 mg | Freq: Once | RECTAL | Status: AC
  Administered 2020-06-24: 10 mg via RECTAL
  Filled 2020-06-24: qty 1

## 2020-06-24 MED ORDER — POLYETHYLENE GLYCOL 3350 17 G PO PACK
17.0000 g | PACK | Freq: Every day | ORAL | Status: DC
  Administered 2020-06-24 – 2020-06-29 (×6): 17 g via ORAL
  Filled 2020-06-24 (×6): qty 1

## 2020-06-24 MED ORDER — AMLODIPINE BESYLATE 5 MG PO TABS
5.0000 mg | ORAL_TABLET | Freq: Every day | ORAL | Status: DC
  Administered 2020-06-24 – 2020-06-30 (×7): 5 mg via ORAL
  Filled 2020-06-24 (×7): qty 1

## 2020-06-24 NOTE — Progress Notes (Signed)
[facility]                                                                              Patient Demographics  Samantha Bishop, is a 87 y.o. female, DOB - [birth year], ZSW:[id number]  Admit date - 06/21/2020   Admitting Physician Doran Heater, DO  Outpatient Primary MD for the patient is Hoyt Koch, MD  Outpatient specialists:   LOS - 2  days   Medical records reviewed and are as summarized below:    Chief Complaint  Patient presents with  . Fall       Brief summary   Patient is a 87 year old female with hypertension, dyslipidemia, chronic A. fib on Eliquis, previous CVA, H/O PE, who presented from home on 06/21/2020 due to fall. Patient was raking leaves just prior to presentation when she tripped fell, and hitting the back of her head on the ground.  She describes the fall as mechanical.  Did not have any loss of consciousness.  She had excruciating pain and could not ambulate afterwards.  Pain is located in the back of her neck, nonradiating. Lives alone, uses cane for ambulation.  CT of cervical spine demonstrates acute nondisplaced fracture at the base of the dens, and acute nondisplaced fracture of the anterior arch of C1 Neurosurgery was consulted, Aspen collar, no surgical intervention, outpatient follow-up in 1 to 2 weeks Patient in intractable pain and was admitted for further management and pain control  2/15: Awaiting skilled nursing facility  Assessment & Plan    Principal Problem:   Cervical spine fracture ([place]) status post mechanical fall - CT head negative for any fracture or bleed.  CT C-spine showed acute nondisplaced fracture at the base of dens, type II, acute nondisplaced fracture of the anterior arch of C1 - Neurosurgery consulted, recommended Aspen collar all the time, no surgical intervention at this time, outpatient follow-up with neurosurgery 1 to 2 weeks - Repeat CT head on 2/13 neg for any bleed, Eliquis was resumed -  Continue current pain regimen, scheduled Tylenol, IV morphine, oxycodone, gabapentin, Robaxin  - Continue bowel regimen - DEXA scan outpatient   Active Problems:   Essential hypertension -BP likely elevated due to anxiety and pain, continue HCTZ, add Norvasc 5 mg daily -Continue hydralazine IV as needed with parameters  Chronic atrial fibrillation - followed by Dr. Caryl Comes -Not on any rate control meds or antiarrhythmics  -Follow-up CT head on 2/13 showed no bleeding - eliquis resumed   Vitamin D deficiency -Vitamin D 25-OH 23.0, continue vitamin D supplement 2000 units daily   Prior history of CVA (cerebral vascular accident) ([place]) -Currently no acute neurological deficits, continue statin and eliquis -Patient needs to discuss with her PCP/cards regarding anticoagulation with risk of falls however at this point she wants to resume her eliquis.   Constipation -Placed on bowel regimen with scheduled stool softeners, MiraLAX.  If no BM today, may need enema tomorrow  Code Status: Full code DVT Prophylaxis:  SCDs Start: 06/21/20 2037 apixaban (ELIQUIS) tablet 5 mg   Level of Care: Level of care: Med-Surg Family Communication: Discussed all imaging results, lab results, explained  to the patient and patient's son, Avianah Pellman phone # [phone] on 2/13, 2/14 and 2/15 today    Disposition Plan:     Status is: inpatient  The patient will require care spanning > 2 midnights because: Inpatient level of care appropriate due to severity of illness  Dispo: The patient is from: Home              Anticipated d/c is to: SNF              Anticipated d/c date is: 2 days              Patient currently is not medically stable to d/c.  Still in a lot of pain, awaiting skilled nursing facility for rehab.  Lives alone, will not be able to manage at home at this time   Difficult to place patient No   Time Spent in minutes 25 minutes  Procedures:  CT head and C-spine  Consultants:    Neurosurgery  Antimicrobials:   Anti-infectives (From admission, onward)   None         Medications  Scheduled Meds: . acetaminophen  1,000 mg Oral TID  . apixaban  5 mg Oral BID  . cholecalciferol  2,000 Units Oral Daily  . docusate sodium  100 mg Oral BID  . fluticasone  2 spray Each Nare Daily  . gabapentin  600 mg Oral TID  . hydrochlorothiazide  12.5 mg Oral Daily  . polyethylene glycol  17 g Oral Daily  . pravastatin  20 mg Oral q1800  . senna  1 tablet Oral BID   Continuous Infusions: . lactated ringers 10 mL/hr at 06/23/20 1305  . methocarbamol (ROBAXIN) IV     PRN Meds:.acetaminophen **OR** acetaminophen, hydrALAZINE, labetalol, LORazepam, methocarbamol (ROBAXIN) IV, morphine injection, ondansetron **OR** ondansetron (ZOFRAN) IV, oxyCODONE, sodium chloride      Subjective:   Karen Huhta was seen and examined today.  Continues to have pain in her neck every time she moves.  No nausea or vomiting.  Very anxious.  No focal neurological deficits.   Patient denies dizziness, chest pain, shortness of breath, abdominal pain. + Constipated   Objective:   Vitals:   06/23/20 2018 06/24/20 0400 06/24/20 0850 06/24/20 1430  BP: (!) 149/75 (!) 166/72 (!) 145/64 (!) 170/69  Pulse: 87 85 80 80  Resp: 17 16 20 20   Temp: 98.6 F (37 C) 98 F (36.7 C) 98.2 F (36.8 C) 98 F (36.7 C)  TempSrc: Oral Oral Oral Oral  SpO2: 92% 94% 98% 100%  Weight:      Height:        Intake/Output Summary (Last 24 hours) at 06/24/2020 1511 Last data filed at 06/24/2020 1431 Gross per 24 hour  Intake 240 ml  Output 2150 ml  Net -1910 ml     Wt Readings from Last 3 Encounters:  06/21/20 81.6 kg  05/13/20 80.4 kg  03/20/20 80.3 kg   Physical Exam  General: Alert and oriented x 3 anxious and uncomfortable with pain  Cardiovascular: S1 S2 clear, RRR. No pedal edema b/l  Respiratory: CTAB, no wheezing, rales or rhonchi  Gastrointestinal: Soft, nontender, nondistended,  NBS  Ext: no pedal edema bilaterally  Neuro: no new deficits  Musculoskeletal: No cyanosis, clubbing  Skin: No rashes  Psych: anxious   Data Reviewed:  I have personally reviewed following labs and imaging studies  Micro Results Recent Results (from the past 240 hour(s))  Resp Panel by RT-PCR (Flu A&B,  Covid) Nasopharyngeal Swab     Status: None   Collection Time: 06/21/20  7:31 PM   Specimen: Nasopharyngeal Swab; Nasopharyngeal(NP) swabs in vial transport medium  Result Value Ref Range Status   SARS Coronavirus 2 by RT PCR NEGATIVE NEGATIVE Final    Comment: (NOTE) SARS-CoV-2 target nucleic acids are NOT DETECTED.  The SARS-CoV-2 RNA is generally detectable in upper respiratory specimens during the acute phase of infection. The lowest concentration of SARS-CoV-2 viral copies this assay can detect is 138 copies/mL. A negative result does not preclude SARS-Cov-2 infection and should not be used as the sole basis for treatment or other patient management decisions. A negative result may occur with  improper specimen collection/handling, submission of specimen other than nasopharyngeal swab, presence of viral mutation(s) within the areas targeted by this assay, and inadequate number of viral copies(<138 copies/mL). A negative result must be combined with clinical observations, patient history, and epidemiological information. The expected result is Negative.  Fact Sheet for Patients:  EntrepreneurPulse.com.au  Fact Sheet for Healthcare Providers:  IncredibleEmployment.be  This test is no t yet approved or cleared by the [place] FDA and  has been authorized for detection and/or diagnosis of SARS-CoV-2 by FDA under an Emergency Use Authorization (EUA). This EUA will remain  in effect (meaning this test can be used) for the duration of the COVID-19 declaration under Section 564(b)(1) of the Act, 21 U.S.C.section 360bbb-3(b)(1),  unless the authorization is terminated  or revoked sooner.       Influenza A by PCR NEGATIVE NEGATIVE Final   Influenza B by PCR NEGATIVE NEGATIVE Final    Comment: (NOTE) The Xpert Xpress SARS-CoV-2/FLU/RSV plus assay is intended as an aid in the diagnosis of influenza from Nasopharyngeal swab specimens and should not be used as a sole basis for treatment. Nasal washings and aspirates are unacceptable for Xpert Xpress SARS-CoV-2/FLU/RSV testing.  Fact Sheet for Patients: EntrepreneurPulse.com.au  Fact Sheet for Healthcare Providers: IncredibleEmployment.be  This test is not yet approved or cleared by the [place] FDA and has been authorized for detection and/or diagnosis of SARS-CoV-2 by FDA under an Emergency Use Authorization (EUA). This EUA will remain in effect (meaning this test can be used) for the duration of the COVID-19 declaration under Section 564(b)(1) of the Act, 21 U.S.C. section 360bbb-3(b)(1), unless the authorization is terminated or revoked.  Performed at [facility], [place] [address] [place], [place] [address]     Radiology Reports CT HEAD WO CONTRAST  Result Date: 06/22/2020 CLINICAL DATA:  Poly trauma, critical, head/cervical spine injury suspected. Severe headache, fall. EXAM: CT HEAD WITHOUT CONTRAST TECHNIQUE: Contiguous axial images were obtained from the base of the skull through the vertex without intravenous contrast. COMPARISON:  Noncontrast head CT 06/21/2020 FINDINGS: Brain: Mild cerebral atrophy. Redemonstrated chronic cortical/subcortical infarcts within the right parietal lobe and left occipital lobes. Redemonstrated small chronic infarct within the left cerebellar hemisphere. Patchy and ill-defined hypoattenuation within the cerebral white matter is nonspecific, but compatible with chronic small vessel ischemic disease. There is no acute intracranial hemorrhage. No acute demarcated cortical infarct.  No extra-axial fluid collection. No evidence of intracranial mass. No midline shift. Vascular: No hyperdense vessel.  Atherosclerotic calcifications. Skull: Normal. Negative for fracture or focal lesion. Sinuses/Orbits: Visualized orbits show no acute finding. Mild scattered paranasal sinus mucosal thickening. Left maxillary sinus mucous retention cyst. Other: Known acute type 2 dens fracture incompletely assessed. Known acute fracture of the C1 anterior arch. IMPRESSION: No evidence of acute  intracranial abnormality. Redemonstrated generalized atrophy, chronic small vessel ischemic disease and chronic infarcts as described. Known acute type 2 dens fracture and acute fracture of the C1 anterior arch. Paranasal sinus disease as described. Electronically Signed   By: Kellie Simmering DO   On: 06/22/2020 12:09   CT Head Wo Contrast  Result Date: 06/21/2020 CLINICAL DATA:  Fall with head trauma.  Anticoagulated. EXAM: CT HEAD WITHOUT CONTRAST CT CERVICAL SPINE WITHOUT CONTRAST TECHNIQUE: Multidetector CT imaging of the head and cervical spine was performed following the standard protocol without intravenous contrast. Multiplanar CT image reconstructions of the cervical spine were also generated. COMPARISON:  Head CT 01/04/2019 FINDINGS: CT HEAD FINDINGS Brain: No abnormality affects the brainstem. Old inferior cerebellar stroke on the left. Old left PCA territory infarction affecting the left occipital lobe. Old right parietal cortical and subcortical infarction. Chronic small-vessel ischemic changes diffusely throughout the hemispheric white matter. No sign of acute infarction, mass lesion, hemorrhage, hydrocephalus or extra-axial collection. Vascular: There is atherosclerotic calcification of the major vessels at the base of the brain. Skull: Negative.  No skull fracture. Sinuses/Orbits: Clear/normal Other: None CT CERVICAL SPINE FINDINGS Alignment: Normal Skull base and vertebrae: Acute nondisplaced fracture at the  base of the dens, type 2. Acute nondisplaced fracture of the anterior arch of C1. No other regional injury. Soft tissues and spinal canal: No soft tissue finding. Disc levels:  Chronic arthritis at the C1-2 articulation. C2-3: Chronic facet fusion.  No compressive stenosis. C3-4: Facet osteoarthritis. Bulging of the disc. Foraminal stenosis left worse than right. C4-5: Chronic fusion.  No stenosis. C5-6: Facet osteoarthritis on the left. Mild left bony foraminal stenosis. C6-7: Degenerative spondylosis with endplate osteophytes. Facet arthritis on the left. Mild left foraminal narrowing. C7-T1: Chronic facet arthritis.  No significant stenosis. Upper chest: Negative Other: None IMPRESSION: HEAD CT: No acute or traumatic finding. Old infarctions as outlined above. CERVICAL SPINE CT: 1. Acute nondisplaced fracture at the base of the dens, type 2. Acute nondisplaced fracture of the anterior arch of C1. No other regional injury. 2. Chronic degenerative changes as outlined above. 3. These results were called by telephone at the time of interpretation on 06/21/2020 at 7:44 pm to provider [facility] , who verbally acknowledged these results. Electronically Signed   By: Nelson Chimes M.D.   On: 06/21/2020 19:44   CT Cervical Spine Wo Contrast  Result Date: 06/21/2020 CLINICAL DATA:  Fall with head trauma.  Anticoagulated. EXAM: CT HEAD WITHOUT CONTRAST CT CERVICAL SPINE WITHOUT CONTRAST TECHNIQUE: Multidetector CT imaging of the head and cervical spine was performed following the standard protocol without intravenous contrast. Multiplanar CT image reconstructions of the cervical spine were also generated. COMPARISON:  Head CT 01/04/2019 FINDINGS: CT HEAD FINDINGS Brain: No abnormality affects the brainstem. Old inferior cerebellar stroke on the left. Old left PCA territory infarction affecting the left occipital lobe. Old right parietal cortical and subcortical infarction. Chronic small-vessel ischemic changes diffusely  throughout the hemispheric white matter. No sign of acute infarction, mass lesion, hemorrhage, hydrocephalus or extra-axial collection. Vascular: There is atherosclerotic calcification of the major vessels at the base of the brain. Skull: Negative.  No skull fracture. Sinuses/Orbits: Clear/normal Other: None CT CERVICAL SPINE FINDINGS Alignment: Normal Skull base and vertebrae: Acute nondisplaced fracture at the base of the dens, type 2. Acute nondisplaced fracture of the anterior arch of C1. No other regional injury. Soft tissues and spinal canal: No soft tissue finding. Disc levels:  Chronic arthritis at the C1-2 articulation.  C2-3: Chronic facet fusion.  No compressive stenosis. C3-4: Facet osteoarthritis. Bulging of the disc. Foraminal stenosis left worse than right. C4-5: Chronic fusion.  No stenosis. C5-6: Facet osteoarthritis on the left. Mild left bony foraminal stenosis. C6-7: Degenerative spondylosis with endplate osteophytes. Facet arthritis on the left. Mild left foraminal narrowing. C7-T1: Chronic facet arthritis.  No significant stenosis. Upper chest: Negative Other: None IMPRESSION: HEAD CT: No acute or traumatic finding. Old infarctions as outlined above. CERVICAL SPINE CT: 1. Acute nondisplaced fracture at the base of the dens, type 2. Acute nondisplaced fracture of the anterior arch of C1. No other regional injury. 2. Chronic degenerative changes as outlined above. 3. These results were called by telephone at the time of interpretation on 06/21/2020 at 7:44 pm to provider [facility] , who verbally acknowledged these results. Electronically Signed   By: Nelson Chimes M.D.   On: 06/21/2020 19:44   DG Chest Portable 1 View  Result Date: 06/21/2020 CLINICAL DATA:  Fall at home. EXAM: PORTABLE CHEST 1 VIEW COMPARISON:  Radiograph 06/26/2016 FINDINGS: Chronic cardiomegaly, not significantly changed. Stable mediastinal contours with aortic atherosclerosis. No pneumothorax or large pleural effusion. No  visualized rib fracture. No focal airspace disease. Mild interstitial coarsening which is chronic. The bones are under mineralized. IMPRESSION: 1. No evidence of acute traumatic injury to the thorax. 2. Stable cardiomegaly. Electronically Signed   By: Keith Rake M.D.   On: 06/21/2020 19:58    Lab Data:  CBC: Recent Labs  Lab 06/21/20 1910 06/22/20 0211 06/23/20 0403  WBC 9.7 8.7 9.6  NEUTROABS 6.9  --   --   HGB 11.7* 11.0* 10.9*  HCT 37.1 32.6* 35.4*  MCV 91.2 88.1 92.7  PLT 201 185 932   Basic Metabolic Panel: Recent Labs  Lab 06/21/20 1910 06/22/20 0211 06/23/20 0403 06/24/20 0258  NA 138 137 135 134*  K 3.3* 3.4* 3.9 3.6  CL 103 100 99 99  CO2 22 26 23 27   GLUCOSE 122* 143* 107* 125*  BUN 11 10 15 11   CREATININE 0.55 0.52 0.68 0.56  CALCIUM 9.2 9.2 9.2 9.1  MG  --  1.9  --   --   PHOS  --  3.7  --   --    GFR: Estimated Creatinine Clearance: 47.3 mL/min (by C-G formula based on SCr of 0.56 mg/dL). Liver Function Tests: Recent Labs  Lab 06/22/20 0211  AST 18  ALT 13  ALKPHOS 59  BILITOT 0.9  PROT 6.7  ALBUMIN 3.7   No results for input(s): LIPASE, AMYLASE in the last 168 hours. No results for input(s): AMMONIA in the last 168 hours. Coagulation Profile: Recent Labs  Lab 06/21/20 1910 06/22/20 0211  INR 1.1 1.3*   Cardiac Enzymes: No results for input(s): CKTOTAL, CKMB, CKMBINDEX, TROPONINI in the last 168 hours. BNP (last 3 results) No results for input(s): PROBNP in the last 8760 hours. HbA1C: No results for input(s): HGBA1C in the last 72 hours. CBG: No results for input(s): GLUCAP in the last 168 hours. Lipid Profile: No results for input(s): CHOL, HDL, LDLCALC, TRIG, CHOLHDL, LDLDIRECT in the last 72 hours. Thyroid Function Tests: Recent Labs    06/22/20 0211  TSH 1.093   Anemia Panel: No results for input(s): VITAMINB12, FOLATE, FERRITIN, TIBC, IRON, RETICCTPCT in the last 72 hours. Urine analysis:    Component Value  Date/Time   COLORURINE YELLOW 06/26/2016 1536   APPEARANCEUR CLOUDY (A) 06/26/2016 1536   LABSPEC 1.005 06/26/2016 1536  PHURINE 6.0 06/26/2016 1536   GLUCOSEU NEGATIVE 06/26/2016 1536   HGBUR LARGE (A) 06/26/2016 1536   BILIRUBINUR neg 07/15/2016 1649   KETONESUR NEGATIVE 06/26/2016 1536   PROTEINUR 15mg  07/15/2016 1649   PROTEINUR NEGATIVE 06/26/2016 1536   UROBILINOGEN 0.2 07/15/2016 1649   UROBILINOGEN 0.2 08/20/2011 1151   NITRITE neg 07/15/2016 1649   NITRITE POSITIVE (A) 06/26/2016 1536   LEUKOCYTESUR Negative 07/15/2016 1649     Aria Jarrard M.D. [facility] 06/24/2020, 3:11 PM   Call night coverage person covering after 7pm

## 2020-06-24 NOTE — Plan of Care (Signed)

## 2020-06-24 NOTE — Plan of Care (Signed)
  Problem: Nutrition: Goal: Adequate nutrition will be maintained Outcome: Progressing   Problem: Pain Managment: Goal: General experience of comfort will improve Outcome: Progressing   Problem: Safety: Goal: Ability to remain free from injury will improve Outcome: Progressing   

## 2020-06-25 DIAGNOSIS — S12000A Unspecified displaced fracture of first cervical vertebra, initial encounter for closed fracture: Secondary | ICD-10-CM | POA: Diagnosis not present

## 2020-06-25 LAB — BASIC METABOLIC PANEL
Anion gap: 10 (ref 5–15)
BUN: 18 mg/dL (ref 8–23)
CO2: 28 mmol/L (ref 22–32)
Calcium: 9.4 mg/dL (ref 8.9–10.3)
Chloride: 99 mmol/L (ref 98–111)
Creatinine, Ser: 0.66 mg/dL (ref 0.44–1.00)
GFR, Estimated: >60 mL/min (ref >60)
Glucose, Bld: 132 mg/dL — ABNORMAL HIGH (ref 70–99)
Potassium: 3.4 mmol/L — ABNORMAL LOW (ref 3.5–5.1)
Sodium: 137 mmol/L (ref 135–145)

## 2020-06-25 MED ORDER — VITAMIN D3 25 MCG PO TABS: 2000.0000 [IU] | ORAL_TABLET | Freq: Every day | ORAL | Status: DC

## 2020-06-25 MED ORDER — LORAZEPAM 0.5 MG PO TABS: 0.5000 mg | ORAL_TABLET | Freq: Three times a day (TID) | ORAL | 0 refills | Status: AC | PRN

## 2020-06-25 MED ORDER — POTASSIUM CHLORIDE CRYS ER 20 MEQ PO TBCR
20.0000 meq | EXTENDED_RELEASE_TABLET | Freq: Two times a day (BID) | ORAL | Status: AC
  Administered 2020-06-25 – 2020-06-26 (×4): 20 meq via ORAL
  Filled 2020-06-25 (×4): qty 1

## 2020-06-25 MED ORDER — SENNA 8.6 MG PO TABS: 1.0000 | ORAL_TABLET | Freq: Two times a day (BID) | ORAL | 0 refills | Status: DC

## 2020-06-25 MED ORDER — OXYCODONE HCL 5 MG PO TABS: 5.0000 mg | ORAL_TABLET | ORAL | 0 refills | Status: AC | PRN

## 2020-06-25 NOTE — Progress Notes (Signed)
Occupational Therapy Treatment Patient Details Name: Samantha Bishop MRN: [id number] DOB: 1924-06-28 Today's Date: 06/25/2020    History of present illness 87 y.o. female presenting s/p mechanical fall while raking leaves resulting in acute nondisplaced cervical fx. at base of dens and at anterior arch of C1.   PMHx significant for HTN, DLD, chronic A-fib, Hx of CVA, & Hx of PE.   OT comments  Pt making steady progress towards OT goals this session. Pt continues to be limited by pain, cervical precautions, cognitive deficits ( see cog section) and decreased activity tolerance, however pt able to progress OOB to recliner to complete x2 stand pivot transfer with RW and up to MOD A as pt needing step by step cues to sequence all aspects of functional mobility. Pt currently requires up to MOD A for LB ADLs from [facility]. Pt would continue to benefit from skilled occupational therapy while admitted and after d/c to address the below listed limitations in order to improve overall functional mobility and facilitate independence with BADL participation. DC plan remains appropriate, will follow acutely per POC.     Follow Up Recommendations  SNF;Supervision/Assistance - 24 hour    Equipment Recommendations  None recommended by OT;Other (comment) (Patient has necessary DME.)    Recommendations for Other Services      Precautions / Restrictions Precautions Precautions: Cervical;Fall Precaution Comments: education provided during session on cervical precautions, continued reinforcement needed Required Braces or Orthoses: Cervical Brace Cervical Brace: Hard collar;At all times Restrictions Weight Bearing Restrictions: No       Mobility Bed Mobility Overal bed mobility: Needs Assistance Bed Mobility: Rolling;Sidelying to Sit Rolling: Min assist Sidelying to sit: Mod assist;HOB elevated       General bed mobility comments: light MIN A to roll to pts L side needing tactile cues for hand placement  and tactile cues to sequence bending knees and keeping feet placed on bed, MOD A to elevate trunk into sitting from sidelying.  Transfers Overall transfer level: Needs assistance Equipment used: Rolling walker (2 wheeled) Transfers: Sit to/from [facility] Sit to Stand: Mod assist;From elevated surface Stand pivot transfers: Min assist;Mod assist       General transfer comment: MOD A to rise from EOB and BSC, cues for hand placement each trial, MIN- MOD A for x2 stand pivot transfer from EOB>bSC>recliner, pt requried multimodal cues to sequence  pivotal steps    Balance Overall balance assessment: Needs assistance Sitting-balance support: Feet supported;No upper extremity supported Sitting balance-Leahy Scale: Fair Sitting balance - Comments: pt able to maintain static sitting EOB with no UE support or LOB   Standing balance support: Bilateral upper extremity supported Standing balance-Leahy Scale: Poor Standing balance comment: reliant on BUE support and external assist                           ADL either performed or assessed with clinical judgement   ADL Overall ADL's : Needs assistance/impaired Eating/Feeding: Set up;Sitting Eating/Feeding Details (indicate cue type and reason): sitting in recliner         Lower Body Bathing: Moderate assistance;Sitting/lateral leans Lower Body Bathing Details (indicate cue type and reason): pt able to wash top of legs from sitting on BSC, MOD A for lower legs         Toilet Transfer: Minimal assistance;Moderate assistance;RW;Stand-pivot;BSC Toilet Transfer Details (indicate cue type and reason): MIN- MOD A for stand pivot transfer from EOB>BSC, pt with difficulty with problem  solving and sequencing steps needing step by step cues to follow commands Toileting- Clothing Manipulation and Hygiene: Supervision/safety;Set up;Sitting/lateral lean Toileting - Clothing Manipulation Details (indicate cue type and  reason): pt able to complete anterior pericare with set- up assist and supervision via lateral leans from [facility]     Functional mobility during ADLs: Minimal assistance;Moderate assistance;Rolling walker General ADL Comments: pt continues to be limited by pain but after talking to her dtr on the phone pt more motivated to do more.     Vision       Perception     Praxis      Cognition Arousal/Alertness: Awake/alert Behavior During Therapy: Anxious;WFL for tasks assessed/performed;Impulsive Overall Cognitive Status: Impaired/Different from baseline Area of Impairment: Safety/judgement;Following commands;Problem solving;Attention;Awareness                   Current Attention Level: Focused   Following Commands: Follows one step commands inconsistently;Follows one step commands with increased time Safety/Judgement: Decreased awareness of safety;Decreased awareness of deficits Awareness: Intellectual Problem Solving: Slow processing;Difficulty sequencing;Requires verbal cues;Requires tactile cues General Comments: pt noted to be slurring speech having great difficulty with word finding such as stating her street name, however feel slurred speech is likely related to too much medication as pt also having trouble following commands and proble solving, then pt perseverating on not wanting to do anything to hinder her speech, pt stating " i dont have alzhemiers" pt required step step multimodal cues to sequence all mobility.        Exercises     Shoulder Instructions       General Comments incontinent of urine during transfer    Pertinent Vitals/ Pain       Pain Assessment: Faces Pain Score: 10-Worst pain ever Faces Pain Scale: Hurts even more Pain Location: back of head Pain Descriptors / Indicators: Aching;Sharp;Grimacing;Constant Pain Intervention(s): Monitored during session;Premedicated before session;Repositioned  Home Living                                           Prior Functioning/Environment              Frequency  Min 2X/week        Progress Toward Goals  OT Goals(current goals can now be found in the care plan section)  Progress towards OT goals: Progressing toward goals  Acute Rehab OT Goals Patient Stated Goal: wants pain to go away OT Goal Formulation: With patient Time For Goal Achievement: 07/06/20 Potential to Achieve Goals: [place] Discharge plan remains appropriate;Frequency remains appropriate    Co-evaluation                 AM-PAC OT "6 Clicks" Daily Activity     Outcome Measure   Help from another person eating meals?: A Little (set- up) Help from another person taking care of personal grooming?: A Little Help from another person toileting, which includes using toliet, bedpan, or urinal?: A Lot Help from another person bathing (including washing, rinsing, drying)?: A Lot Help from another person to put on and taking off regular upper body clothing?: A Little Help from another person to put on and taking off regular lower body clothing?: Total 6 Click Score: 14    End of Session Equipment Utilized During Treatment: Gait belt;Rolling walker;Other (comment);Cervical collar (BSC)  OT Visit Diagnosis: Muscle weakness (generalized) (M62.81);History of falling (Z91.81);Other abnormalities  of gait and mobility (R26.89);Pain Pain - Right/Left: Right   Activity Tolerance Patient tolerated treatment well   Patient Left in chair;with call bell/phone within reach;with chair alarm set   Nurse Communication Mobility status;Other (comment) (stedy back to bed for safety)        Time: 1105-1200 OT Time Calculation (min): 55 min  Charges: OT General Charges $OT Visit: 1 Visit OT Treatments $Self Care/Home Management : 53-67 mins  Admire Bunnell., COTA/L Acute Rehabilitation Services [phone]    Sophya Vanblarcom 06/25/2020, 1:05 PM

## 2020-06-25 NOTE — Plan of Care (Signed)
  Problem: Safety: Goal: Ability to remain free from injury will improve Outcome: Progressing   Problem: Pain Managment: Goal: General experience of comfort will improve Outcome: Progressing   

## 2020-06-25 NOTE — Progress Notes (Signed)
Physical Therapy Treatment Patient Details Name: Samantha Bishop MRN: [id number] DOB: 20-Oct-1924 Today's Date: 06/25/2020    History of Present Illness 87 y.o. female presenting s/p mechanical fall while raking leaves resulting in acute nondisplaced cervical fx. at base of dens and at anterior arch of C1.   PMHx significant for HTN, DLD, chronic A-fib, Hx of CVA, & Hx of PE.    PT Comments    Continuing work on functional mobility and activity tolerance;  Pain significantly limiting activity tolerance today, and session was limited to getting to a tolerable position in bed to allow pt to eat breakfast;   Pt requesting to be positioned so that she could eat breakfast upon PT arrival; Offered to assist pt out of bed (as we have done in previous two sessions with RW), but all attempts at initiating OOB were met with crying and too much pain; Tu, RN notified, and came in to help with repositioning and to give medication; Total assist to slide pt up in bed, and to place padding for support for her neck and support under her R side and shoulder for more centered trunk position to eat; bed placed in semi-chair poistion, bed alarm activated;   Tu, RN, indicated that Samantha Bishop did have morphine prior to this PT session; her pain level this session is concerning, especially considering that she did have medication for pain; shared concerns with Samantha Bishop and Samantha Bishop, COTA  Follow Up Recommendations  SNF     Equipment Recommendations  Rolling walker with 5" wheels;3in1 (PT)    Recommendations for Other Services       Precautions / Restrictions Precautions Precautions: Cervical;Fall Precaution Comments: Will need reinforcement Required Braces or Orthoses: Cervical Brace Cervical Brace: Hard collar;At all times Restrictions Weight Bearing Restrictions: No    Mobility  Bed Mobility Overal bed mobility: Needs Assistance             General bed mobility comments: So painful today  and did not tolerate attempts at rolling; Declining OOB; Total assist to slide up to optimal poistion in bed to use the bed-to-chair function; put bed in semi-cahir position, towel and blanket rolls to support neck in semi-upright position    Transfers                 General transfer comment: Unable due to pain  Ambulation/Gait             General Gait Details: Unalble today   Stairs             Wheelchair Mobility    Modified Rankin (Stroke Patients Only)       Balance                                            Cognition Arousal/Alertness: Awake/alert Behavior During Therapy: WFL for tasks assessed/performed;Anxious Overall Cognitive Status: Impaired/Different from baseline Area of Impairment: Memory;Safety/judgement                               General Comments: Extremely internally distracted by pain      Exercises      General Comments        Pertinent Vitals/Pain Pain Assessment: Faces Pain Score: 10-Worst pain ever Pain Location: Cervical neck, especially during transition movements Pain Descriptors / Indicators: Aching;Sharp;Grimacing;Crying  Pain Intervention(s): Premedicated before session    Home Living                      Prior Function            PT Goals (current goals can now be found in the care plan section) Acute Rehab PT Goals Patient Stated Goal: To decrease pain; wants to get home PT Goal Formulation: With patient Time For Goal Achievement: 07/06/20 Potential to Achieve Goals: Good Progress towards PT goals: Not progressing toward goals - comment (limited by pain this morning)    Frequency    Min 2X/week      PT Plan Current plan remains appropriate    Co-evaluation              AM-PAC PT "6 Clicks" Mobility   Outcome Measure  Help needed turning from your back to your side while in a flat bed without using bedrails?: A Lot Help needed moving from lying  on your back to sitting on the side of a flat bed without using bedrails?: Total Help needed moving to and from a bed to a chair (including a wheelchair)?: Total Help needed standing up from a chair using your arms (e.g., wheelchair or bedside chair)?: Total Help needed to walk in hospital room?: Total Help needed climbing 3-5 steps with a railing? : Total 6 Click Score: 7    End of Session Equipment Utilized During Treatment: Cervical collar Activity Tolerance: Patient limited by pain Patient left: in bed;with bed alarm set;with nursing/sitter in room;Other (comment) (Bed in semi-chair position) Nurse Communication: Mobility status;Patient requests pain meds PT Visit Diagnosis: Unsteadiness on feet (R26.81);Other abnormalities of gait and mobility (R26.89);Pain Pain - part of body:  (Neck)     Time: 5462-7035 PT Time Calculation (min) (ACUTE ONLY): 30 min  Charges:  $Therapeutic Activity: 23-37 mins                     Samantha Bishop, PT  Acute Rehabilitation Services Pager [phone] Office [phone]    Samantha Bishop 06/25/2020, 10:50 AM

## 2020-06-25 NOTE — Plan of Care (Signed)

## 2020-06-25 NOTE — Progress Notes (Signed)
PROGRESS NOTE    Samantha Bishop  BCW:[id number] DOB: Sep 08, 1924 DOA: 06/21/2020 PCP: Hoyt Koch, MD    Brief Narrative:  87 year old female from home with history of hypertension, dyslipidemia, chronic A. fib on Eliquis, history of PE presented from home on 2/12 due to fall.  She was raking leaves and tripped on a brick.  In the emergency room, a skeletal survey including CT scan of the head and neck, cervical spine done that showed nondisplaced fracture of the base of the dens and C1. She stayed in the hospital with persistent pain.   Assessment & Plan:   Principal Problem:   Cervical spine fracture [facility]) Active Problems:   Essential hypertension   Atrial fibrillation - followed by Dr. Caryl Comes   CVA (cerebral vascular accident) [facility])   Intractable pain   Hypokalemia   Fall from ground level   Closed cervical spine fracture ([place])  Close traumatic cervical spine fracture: Fracture of the base of the dens, acute nondisplaced fracture of the anterior arch of C1. Seen by neurosurgery, recommended conservative management.  Aspen collar all the time.  Neurosurgery to schedule follow-up in 2 weeks. Still with significant pain and discomfort with cervical collar. Use adequate pain medications along with laxatives and continue to work with PT OT. Still using IV pain medications for adequate pain control. Transition to a skilled nursing facility for rehab once able to manage pain with oral pain medications.  Hypertension: Blood pressures currently stable on hydrochlorothiazide and Norvasc.  Chronic A. fib: Rate controlled.  Not on any rate control medications.  Therapeutic on Eliquis.  History of a stroke: No evidence of new stroke.  Remains on a statin and Eliquis.   DVT prophylaxis: SCDs Start: 06/21/20 2037 apixaban (ELIQUIS) tablet 5 mg   Code Status: Full code Family Communication: None today. Disposition Plan: Status is: Inpatient  Remains inpatient appropriate  because:Inpatient level of care appropriate due to severity of illness   Dispo: The patient is from: Home              Anticipated d/c is to: SNF              Anticipated d/c date is: 1 day              Patient currently is not medically stable to d/c.   Difficult to place patient No Still has significant pain.  Hopefully next 24 to 48 hours, her pain will improve and she will be able to transition to a skilled nursing facility.     Consultants:   Neurosurgery  Procedures:   None  Antimicrobials:   None   Subjective: Patient seen and examined.  She was very upset about overnight not getting attention.  Work with PT OT and had significant discomfort with Aspen collar.  Patient stated her pain is not controlled.  Denies any neurological symptoms.  Objective: Vitals:   06/24/20 1430 06/24/20 2156 06/25/20 0514 06/25/20 0958  BP: (!) 170/69 (!) 152/73 114/76 (!) 159/66  Pulse: 80 91 81 85  Resp: 20 18 18 20   Temp: 98 F (36.7 C) 98 F (36.7 C) (!) 97.5 F (36.4 C) 97.7 F (36.5 C)  TempSrc: Oral Oral Oral Oral  SpO2: 100% 93% 95% 97%  Weight:      Height:        Intake/Output Summary (Last 24 hours) at 06/25/2020 1359 Last data filed at 06/25/2020 0600 Gross per 24 hour  Intake 240 ml  Output 1250 ml  Net -1010 ml   Filed Weights   06/21/20 1904 06/21/20 1909  Weight: 81.6 kg 81.6 kg    Examination:  General exam: Appears mildly anxious.  In moderate distress with pain while mobilizing. She can move all 4 extremities, power and sensation normal in all 4 extremities. Able to swallow without difficulty. Respiratory system: Clear to auscultation. Respiratory effort normal. Cardiovascular system: S1 & S2 heard, RRR. No JVD, murmurs, rubs, gallops or clicks. No pedal edema. Gastrointestinal system: Abdomen is nondistended, soft and nontender. No organomegaly or masses felt. Normal bowel sounds heard. Central nervous system: Alert and oriented. No focal  neurological deficits. Extremities: Symmetric 5 x 5 power. Skin: No rashes, lesions or ulcers Psychiatry: Judgement and insight appear normal. Mood & affect anxious.    Data Reviewed: I have personally reviewed following labs and imaging studies  CBC: Recent Labs  Lab 06/21/20 1910 06/22/20 0211 06/23/20 0403  WBC 9.7 8.7 9.6  NEUTROABS 6.9  --   --   HGB 11.7* 11.0* 10.9*  HCT 37.1 32.6* 35.4*  MCV 91.2 88.1 92.7  PLT 201 185 253   Basic Metabolic Panel: Recent Labs  Lab 06/21/20 1910 06/22/20 0211 06/23/20 0403 06/24/20 0258 06/25/20 0402  NA 138 137 135 134* 137  K 3.3* 3.4* 3.9 3.6 3.4*  CL 103 100 99 99 99  CO2 22 26 23 27 28   GLUCOSE 122* 143* 107* 125* 132*  BUN 11 10 15 11 18   CREATININE 0.55 0.52 0.68 0.56 0.66  CALCIUM 9.2 9.2 9.2 9.1 9.4  MG  --  1.9  --   --   --   PHOS  --  3.7  --   --   --    GFR: Estimated Creatinine Clearance: 47.3 mL/min (by C-G formula based on SCr of 0.66 mg/dL). Liver Function Tests: Recent Labs  Lab 06/22/20 0211  AST 18  ALT 13  ALKPHOS 59  BILITOT 0.9  PROT 6.7  ALBUMIN 3.7   No results for input(s): LIPASE, AMYLASE in the last 168 hours. No results for input(s): AMMONIA in the last 168 hours. Coagulation Profile: Recent Labs  Lab 06/21/20 1910 06/22/20 0211  INR 1.1 1.3*   Cardiac Enzymes: No results for input(s): CKTOTAL, CKMB, CKMBINDEX, TROPONINI in the last 168 hours. BNP (last 3 results) No results for input(s): PROBNP in the last 8760 hours. HbA1C: No results for input(s): HGBA1C in the last 72 hours. CBG: No results for input(s): GLUCAP in the last 168 hours. Lipid Profile: No results for input(s): CHOL, HDL, LDLCALC, TRIG, CHOLHDL, LDLDIRECT in the last 72 hours. Thyroid Function Tests: No results for input(s): TSH, T4TOTAL, FREET4, T3FREE, THYROIDAB in the last 72 hours. Anemia Panel: No results for input(s): VITAMINB12, FOLATE, FERRITIN, TIBC, IRON, RETICCTPCT in the last 72 hours. Sepsis  Labs: No results for input(s): PROCALCITON, LATICACIDVEN in the last 168 hours.  Recent Results (from the past 240 hour(s))  Resp Panel by RT-PCR (Flu A&B, Covid) Nasopharyngeal Swab     Status: None   Collection Time: 06/21/20  7:31 PM   Specimen: Nasopharyngeal Swab; Nasopharyngeal(NP) swabs in vial transport medium  Result Value Ref Range Status   SARS Coronavirus 2 by RT PCR NEGATIVE NEGATIVE Final    Comment: (NOTE) SARS-CoV-2 target nucleic acids are NOT DETECTED.  The SARS-CoV-2 RNA is generally detectable in upper respiratory specimens during the acute phase of infection. The lowest concentration of SARS-CoV-2 viral copies this assay can detect is 138 copies/mL. A negative  result does not preclude SARS-Cov-2 infection and should not be used as the sole basis for treatment or other patient management decisions. A negative result may occur with  improper specimen collection/handling, submission of specimen other than nasopharyngeal swab, presence of viral mutation(s) within the areas targeted by this assay, and inadequate number of viral copies(<138 copies/mL). A negative result must be combined with clinical observations, patient history, and epidemiological information. The expected result is Negative.  Fact Sheet for Patients:  EntrepreneurPulse.com.au  Fact Sheet for Healthcare Providers:  IncredibleEmployment.be  This test is no t yet approved or cleared by the [place] FDA and  has been authorized for detection and/or diagnosis of SARS-CoV-2 by FDA under an Emergency Use Authorization (EUA). This EUA will remain  in effect (meaning this test can be used) for the duration of the COVID-19 declaration under Section 564(b)(1) of the Act, 21 U.S.C.section 360bbb-3(b)(1), unless the authorization is terminated  or revoked sooner.       Influenza A by PCR NEGATIVE NEGATIVE Final   Influenza B by PCR NEGATIVE NEGATIVE Final     Comment: (NOTE) The Xpert Xpress SARS-CoV-2/FLU/RSV plus assay is intended as an aid in the diagnosis of influenza from Nasopharyngeal swab specimens and should not be used as a sole basis for treatment. Nasal washings and aspirates are unacceptable for Xpert Xpress SARS-CoV-2/FLU/RSV testing.  Fact Sheet for Patients: EntrepreneurPulse.com.au  Fact Sheet for Healthcare Providers: IncredibleEmployment.be  This test is not yet approved or cleared by the [place] FDA and has been authorized for detection and/or diagnosis of SARS-CoV-2 by FDA under an Emergency Use Authorization (EUA). This EUA will remain in effect (meaning this test can be used) for the duration of the COVID-19 declaration under Section 564(b)(1) of the Act, 21 U.S.C. section 360bbb-3(b)(1), unless the authorization is terminated or revoked.  Performed at [facility], [place] [address] [place], [place] [address]          Radiology Studies: No results found.      Scheduled Meds: . amLODipine  5 mg Oral Daily  . apixaban  5 mg Oral BID  . cholecalciferol  2,000 Units Oral Daily  . docusate sodium  100 mg Oral BID  . fluticasone  2 spray Each Nare Daily  . gabapentin  600 mg Oral TID  . hydrochlorothiazide  12.5 mg Oral Daily  . polyethylene glycol  17 g Oral Daily  . potassium chloride  20 mEq Oral BID  . pravastatin  20 mg Oral q1800  . senna  1 tablet Oral BID   Continuous Infusions: . lactated ringers 10 mL/hr at 06/23/20 1305  . methocarbamol (ROBAXIN) IV       LOS: 3 days    Time spent: 30 minutes    Barb Merino, MD [facility] Pager [phone]

## 2020-06-26 ENCOUNTER — Inpatient Hospital Stay (HOSPITAL_COMMUNITY): Payer: Medicare Other

## 2020-06-26 DIAGNOSIS — S12000A Unspecified displaced fracture of first cervical vertebra, initial encounter for closed fracture: Secondary | ICD-10-CM | POA: Diagnosis not present

## 2020-06-26 MED ORDER — OXYCODONE HCL 5 MG PO TABS
10.0000 mg | ORAL_TABLET | ORAL | Status: DC | PRN
  Administered 2020-06-26 – 2020-06-30 (×10): 10 mg via ORAL
  Filled 2020-06-26 (×11): qty 2

## 2020-06-26 NOTE — Care Management Important Message (Signed)
Important Message  Patient Details  Name: Samantha Bishop MRN: [id number] Date of Birth: [birth year]   Medicare Important Message Given:  Yes     Maxwell Lemen P Teonna Coonan 06/26/2020, 2:16 PM

## 2020-06-26 NOTE — Progress Notes (Signed)
Physical Therapy Treatment Patient Details Name: Samantha Bishop MRN: [id number] DOB: 1925-02-13 Today's Date: 06/26/2020    History of Present Illness 87 y.o. female presenting s/p mechanical fall while raking leaves resulting in acute nondisplaced cervical fx. at base of dens and at anterior arch of C1.   PMHx significant for HTN, DLD, chronic A-fib, Hx of CVA, & Hx of PE.    PT Comments    Pt making good progress today and was able to transfer with mod A and ambulated 18' with min A.  Pt required frequent cues for precautions and transfer techniques to ease pain.   Pt anxious and required frequent cues and encouragement.  Pt participated in exercises, multiple transfers, and gait.  Required increased time for all transfers.     Follow Up Recommendations  SNF     Equipment Recommendations  Rolling walker with 5" wheels;3in1 (PT)    Recommendations for Other Services       Precautions / Restrictions Precautions Precautions: Cervical;Fall Precaution Booklet Issued: Yes (comment) Precaution Comments: education provided during session on cervical precautions, continued reinforcement needed Required Braces or Orthoses: Cervical Brace Cervical Brace: Hard collar;At all times (at all times per neurosurgery notes)    Mobility  Bed Mobility Overal bed mobility: Needs Assistance Bed Mobility: Rolling;Sidelying to Sit Rolling: Min assist Sidelying to sit: Mod assist;HOB elevated       General bed mobility comments: Increased time and cues for technique (log roll); slow transfer and cues to keep head rolling with body helped to relief pain; mod A to lift trunk slowly with addition support provided to head/neck    Transfers Overall transfer level: Needs assistance Equipment used: Rolling walker (2 wheeled) Transfers: Sit to/from Stand Sit to Stand: Min assist         General transfer comment: Performed sit to stand x 2 with slow rise, cues for hand placement, but only min A  to lift  Ambulation/Gait Ambulation/Gait assistance: Min assist Gait Distance (Feet): 18 Feet Assistive device: Rolling walker (2 wheeled) Gait Pattern/deviations: Step-to pattern;Decreased stride length;Trunk flexed Gait velocity: slow   General Gait Details: Cues for posture and RW; required assist for RW and min A with balance for turns   Stairs             Wheelchair Mobility    Modified Rankin (Stroke Patients Only)       Balance Overall balance assessment: Needs assistance Sitting-balance support: Feet supported;No upper extremity supported Sitting balance-Leahy Scale: Fair Sitting balance - Comments: pt able to maintain static sitting EOB with no UE support or LOB   Standing balance support: Bilateral upper extremity supported Standing balance-Leahy Scale: Poor Standing balance comment: reliant on BUE support and external assist                            Cognition Arousal/Alertness: Awake/alert Behavior During Therapy: Anxious Overall Cognitive Status: Impaired/Different from baseline Area of Impairment: Safety/judgement;Following commands;Problem solving;Attention;Awareness                   Current Attention Level: Sustained Memory: Decreased recall of precautions;Decreased short-term memory Following Commands: Follows one step commands consistently Safety/Judgement: Decreased awareness of safety;Decreased awareness of deficits Awareness: Emergent Problem Solving: Slow processing;Difficulty sequencing;Requires verbal cues;Requires tactile cues General Comments: Pt requiring cues for precautions throughout treatment      Exercises General Exercises - Lower Extremity Ankle Circles/Pumps: AROM;Both;10 reps;Supine Long Arc Quad: AROM;Seated;Both;10 reps  General Comments General comments (skin integrity, edema, etc.): Pt incontinent during transfers - used BSC with min A for ADLs and maintained bed pad like diaper during  ambulation.  Frequent education on precautions      Pertinent Vitals/Pain Pain Assessment: Faces Faces Pain Scale: Hurts whole lot Pain Location: back of head Pain Descriptors / Indicators: Aching;Sharp;Grimacing Pain Intervention(s): Limited activity within patient's tolerance;Monitored during session;Premedicated before session (increased with transfers)    Home Living                      Prior Function            PT Goals (current goals can now be found in the care plan section) Acute Rehab PT Goals Patient Stated Goal: wants pain to go away PT Goal Formulation: With patient Time For Goal Achievement: 07/06/20 Potential to Achieve Goals: Good Progress towards PT goals: Progressing toward goals    Frequency    Min 3X/week      PT Plan Current plan remains appropriate    Co-evaluation              AM-PAC PT "6 Clicks" Mobility   Outcome Measure  Help needed turning from your back to your side while in a flat bed without using bedrails?: A Little Help needed moving from lying on your back to sitting on the side of a flat bed without using bedrails?: A Lot Help needed moving to and from a bed to a chair (including a wheelchair)?: A Little Help needed standing up from a chair using your arms (e.g., wheelchair or bedside chair)?: A Little Help needed to walk in hospital room?: A Little Help needed climbing 3-5 steps with a railing? : A Lot 6 Click Score: 16    End of Session Equipment Utilized During Treatment: Cervical collar Activity Tolerance: Patient tolerated treatment well Patient left: with chair alarm set;in chair;with call bell/phone within reach Nurse Communication: Mobility status;Patient requests pain meds PT Visit Diagnosis: Unsteadiness on feet (R26.81);Other abnormalities of gait and mobility (R26.89);Pain Pain - part of body:  (neck)     Time: 8295-6213 PT Time Calculation (min) (ACUTE ONLY): 38 min  Charges:  $Gait Training:  8-22 mins $Therapeutic Activity: 23-37 mins                     Abran Richard, PT Acute Rehab Services Pager [phone] [facility] [place] 06/26/2020, 4:24 PM

## 2020-06-26 NOTE — Plan of Care (Signed)

## 2020-06-26 NOTE — Progress Notes (Signed)
PROGRESS NOTE    Samantha Bishop  JTT:[id number] DOB: 14-Feb-1925 DOA: 06/21/2020 PCP: Hoyt Koch, MD    Brief Narrative:  87 year old female from home with history of hypertension, dyslipidemia, chronic A. fib on Eliquis, history of PE presented from home on 2/12 due to fall.  She was raking leaves and tripped on a brick.  In the emergency room, a skeletal survey including CT scan of the head and neck, cervical spine done that showed nondisplaced fracture of the base of the dens and C1. She stayed in the hospital with persistent pain.   Assessment & Plan:   Principal Problem:   Cervical spine fracture [facility]) Active Problems:   Essential hypertension   Atrial fibrillation - followed by Dr. Caryl Comes   CVA (cerebral vascular accident) [facility])   Intractable pain   Hypokalemia   Fall from ground level   Closed cervical spine fracture ([place])  Close traumatic cervical spine fracture: Fracture of the base of the dens, acute nondisplaced fracture of the anterior arch of C1. Seen by neurosurgery, recommended conservative management.  Aspen collar all the time.  Neurosurgery to schedule follow-up in 2 weeks. Still with significant pain and discomfort with cervical collar. Use adequate pain medications along with laxatives and continue to work with PT OT. Still using IV pain medications for adequate pain control. Patient complains of severe posterior headache, will recheck CT head today.  Not expecting any new findings. Probably discomfort from using the Aspen collar. Increase doses of oral pain medicine in the hope of decreasing use of IV pain medicine to transition to a skilled nursing rehab.  Hypertension: Blood pressures currently stable on hydrochlorothiazide and Norvasc.  Chronic A. fib: Rate controlled.  Not on any rate control medications.  Therapeutic on Eliquis.  History of a stroke: No evidence of new stroke.  Remains on a statin and Eliquis.   DVT prophylaxis: SCDs Start:  06/21/20 2037 apixaban (ELIQUIS) tablet 5 mg   Code Status: Full code Family Communication: Patient's son on the phone. Disposition Plan: Status is: Inpatient  Remains inpatient appropriate because:Inpatient level of care appropriate due to severity of illness   Dispo: The patient is from: Home              Anticipated d/c is to: SNF              Anticipated d/c date is: 1 day              Patient currently is not medically stable to d/c.   Difficult to place patient No Still has significant pain.  Hopefully next 24 to 48 hours, her pain will improve and she will be able to transition to a skilled nursing facility.     Consultants:   Neurosurgery  Procedures:   None  Antimicrobials:   None   Subjective: Patient seen and examined.  No new neurological deficit but complains of episodic severe pain mostly on the occipital area where her collar is sitting.  She was crying in pain in the morning. Objective: Vitals:   06/25/20 1458 06/25/20 2118 06/26/20 0543 06/26/20 0849  BP: 134/70 (!) 148/75 126/75 (!) 142/64  Pulse: 89 89 86 98  Resp: 20 18 18 17   Temp: 97.9 F (36.6 C) 98.5 F (36.9 C) 98.4 F (36.9 C) 98.5 F (36.9 C)  TempSrc: Oral Oral Oral Oral  SpO2: 95% 92% 94% 91%  Weight:      Height:        Intake/Output  Summary (Last 24 hours) at 06/26/2020 1035 Last data filed at 06/25/2020 1400 Gross per 24 hour  Intake 120 ml  Output 600 ml  Net -480 ml   Filed Weights   06/21/20 1904 06/21/20 1909  Weight: 81.6 kg 81.6 kg    Examination:  General exam: Appears anxious and in moderate pain and discomfort.  She can move all 4 extremities, power and sensation normal in all 4 extremities. Able to swallow without difficulty. Respiratory system: Clear to auscultation. Respiratory effort normal. Cardiovascular system: S1 & S2 heard, RRR. No JVD, murmurs, rubs, gallops or clicks. No pedal edema. Gastrointestinal system: Abdomen is nondistended, soft and  nontender. No organomegaly or masses felt. Normal bowel sounds heard. Central nervous system: Alert and oriented. No focal neurological deficits. Extremities: Symmetric 5 x 5 power. Skin: No rashes, lesions or ulcers Psychiatry: Judgement and insight appear normal. Mood & affect anxious.    Data Reviewed: I have personally reviewed following labs and imaging studies  CBC: Recent Labs  Lab 06/21/20 1910 06/22/20 0211 06/23/20 0403  WBC 9.7 8.7 9.6  NEUTROABS 6.9  --   --   HGB 11.7* 11.0* 10.9*  HCT 37.1 32.6* 35.4*  MCV 91.2 88.1 92.7  PLT 201 185 865   Basic Metabolic Panel: Recent Labs  Lab 06/21/20 1910 06/22/20 0211 06/23/20 0403 06/24/20 0258 06/25/20 0402  NA 138 137 135 134* 137  K 3.3* 3.4* 3.9 3.6 3.4*  CL 103 100 99 99 99  CO2 22 26 23 27 28   GLUCOSE 122* 143* 107* 125* 132*  BUN 11 10 15 11 18   CREATININE 0.55 0.52 0.68 0.56 0.66  CALCIUM 9.2 9.2 9.2 9.1 9.4  MG  --  1.9  --   --   --   PHOS  --  3.7  --   --   --    GFR: Estimated Creatinine Clearance: 47.3 mL/min (by C-G formula based on SCr of 0.66 mg/dL). Liver Function Tests: Recent Labs  Lab 06/22/20 0211  AST 18  ALT 13  ALKPHOS 59  BILITOT 0.9  PROT 6.7  ALBUMIN 3.7   No results for input(s): LIPASE, AMYLASE in the last 168 hours. No results for input(s): AMMONIA in the last 168 hours. Coagulation Profile: Recent Labs  Lab 06/21/20 1910 06/22/20 0211  INR 1.1 1.3*   Cardiac Enzymes: No results for input(s): CKTOTAL, CKMB, CKMBINDEX, TROPONINI in the last 168 hours. BNP (last 3 results) No results for input(s): PROBNP in the last 8760 hours. HbA1C: No results for input(s): HGBA1C in the last 72 hours. CBG: No results for input(s): GLUCAP in the last 168 hours. Lipid Profile: No results for input(s): CHOL, HDL, LDLCALC, TRIG, CHOLHDL, LDLDIRECT in the last 72 hours. Thyroid Function Tests: No results for input(s): TSH, T4TOTAL, FREET4, T3FREE, THYROIDAB in the last 72  hours. Anemia Panel: No results for input(s): VITAMINB12, FOLATE, FERRITIN, TIBC, IRON, RETICCTPCT in the last 72 hours. Sepsis Labs: No results for input(s): PROCALCITON, LATICACIDVEN in the last 168 hours.  Recent Results (from the past 240 hour(s))  Resp Panel by RT-PCR (Flu A&B, Covid) Nasopharyngeal Swab     Status: None   Collection Time: 06/21/20  7:31 PM   Specimen: Nasopharyngeal Swab; Nasopharyngeal(NP) swabs in vial transport medium  Result Value Ref Range Status   SARS Coronavirus 2 by RT PCR NEGATIVE NEGATIVE Final    Comment: (NOTE) SARS-CoV-2 target nucleic acids are NOT DETECTED.  The SARS-CoV-2 RNA is generally detectable in upper  respiratory specimens during the acute phase of infection. The lowest concentration of SARS-CoV-2 viral copies this assay can detect is 138 copies/mL. A negative result does not preclude SARS-Cov-2 infection and should not be used as the sole basis for treatment or other patient management decisions. A negative result may occur with  improper specimen collection/handling, submission of specimen other than nasopharyngeal swab, presence of viral mutation(s) within the areas targeted by this assay, and inadequate number of viral copies(<138 copies/mL). A negative result must be combined with clinical observations, patient history, and epidemiological information. The expected result is Negative.  Fact Sheet for Patients:  EntrepreneurPulse.com.au  Fact Sheet for Healthcare Providers:  IncredibleEmployment.be  This test is no t yet approved or cleared by the [place] FDA and  has been authorized for detection and/or diagnosis of SARS-CoV-2 by FDA under an Emergency Use Authorization (EUA). This EUA will remain  in effect (meaning this test can be used) for the duration of the COVID-19 declaration under Section 564(b)(1) of the Act, 21 U.S.C.section 360bbb-3(b)(1), unless the authorization is  terminated  or revoked sooner.       Influenza A by PCR NEGATIVE NEGATIVE Final   Influenza B by PCR NEGATIVE NEGATIVE Final    Comment: (NOTE) The Xpert Xpress SARS-CoV-2/FLU/RSV plus assay is intended as an aid in the diagnosis of influenza from Nasopharyngeal swab specimens and should not be used as a sole basis for treatment. Nasal washings and aspirates are unacceptable for Xpert Xpress SARS-CoV-2/FLU/RSV testing.  Fact Sheet for Patients: EntrepreneurPulse.com.au  Fact Sheet for Healthcare Providers: IncredibleEmployment.be  This test is not yet approved or cleared by the [place] FDA and has been authorized for detection and/or diagnosis of SARS-CoV-2 by FDA under an Emergency Use Authorization (EUA). This EUA will remain in effect (meaning this test can be used) for the duration of the COVID-19 declaration under Section 564(b)(1) of the Act, 21 U.S.C. section 360bbb-3(b)(1), unless the authorization is terminated or revoked.  Performed at [facility], [place] [address], [place], [place] [address]          Radiology Studies: No results found.      Scheduled Meds: . amLODipine  5 mg Oral Daily  . apixaban  5 mg Oral BID  . cholecalciferol  2,000 Units Oral Daily  . docusate sodium  100 mg Oral BID  . fluticasone  2 spray Each Nare Daily  . gabapentin  600 mg Oral TID  . hydrochlorothiazide  12.5 mg Oral Daily  . polyethylene glycol  17 g Oral Daily  . potassium chloride  20 mEq Oral BID  . pravastatin  20 mg Oral q1800  . senna  1 tablet Oral BID   Continuous Infusions: . lactated ringers 10 mL/hr at 06/23/20 1305  . methocarbamol (ROBAXIN) IV       LOS: 4 days    Time spent: 30 minutes    Barb Merino, MD [facility] Pager [phone]

## 2020-06-26 NOTE — Plan of Care (Signed)

## 2020-06-27 DIAGNOSIS — S12000A Unspecified displaced fracture of first cervical vertebra, initial encounter for closed fracture: Secondary | ICD-10-CM | POA: Diagnosis not present

## 2020-06-27 MED ORDER — MAGNESIUM CITRATE PO SOLN
1.0000 | Freq: Once | ORAL | Status: AC
  Administered 2020-06-27: 1 via ORAL
  Filled 2020-06-27: qty 296

## 2020-06-27 NOTE — Progress Notes (Signed)
Physical Therapy Treatment Patient Details Name: Samantha Bishop MRN: [id number] DOB: 1924-05-27 Today's Date: 06/27/2020    History of Present Illness 87 y.o. female presenting s/p mechanical fall while raking leaves resulting in acute nondisplaced cervical fx. at base of dens and at anterior arch of C1.   PMHx significant for HTN, DLD, chronic A-fib, Hx of CVA, & Hx of PE.    PT Comments    Pt received in chair, talking on phone to family regarding her transition to SNF. Pt very concerned about her discharge to SNF and needed reassurance that she and family would be updated. Min Ax1 for power up into standing and for steadying turning with RW during ambulation. Able to progress gait distance to ~153f today. Demonstrated good hand placement and navigation of RW, occassional cueing to maintain upright posture and look forward. Due to urinary incontinence, adult diaper was used during ambulation and to decrease pt's anxiety related to urine leakage. Able to follow simple commands with increased time, but decreased problem solving, awareness, and attention. Pt left in chair with all needs met, call bell within reach, chair alarm active, and nursing aware of status.    Follow Up Recommendations  SNF;Supervision for mobility/OOB     Equipment Recommendations  Rolling walker with 5" wheels;3in1 (PT)    Recommendations for Other Services       Precautions / Restrictions Precautions Precautions: Cervical;Fall Precaution Booklet Issued: Yes (comment) Precaution Comments: education provided during session on cervical precautions, continued reinforcement needed Required Braces or Orthoses: Cervical Brace Cervical Brace: Hard collar;At all times (at all times per neurosurgery notes) Restrictions Weight Bearing Restrictions: No    Mobility  Bed Mobility               General bed mobility comments: Pt received sitting in chair with neck collar donned    Transfers Overall transfer  level: Needs assistance Equipment used: Rolling walker (2 wheeled) Transfers: Sit to/from Stand Sit to Stand: Min assist         General transfer comment: Min A for power up into standing, increased pain with mobility and anxious to stand due to urine leakage  Ambulation/Gait Ambulation/Gait assistance: Min assist Gait Distance (Feet): 100 Feet Assistive device: Rolling walker (2 wheeled) Gait Pattern/deviations: Step-to pattern;Decreased stride length;Trunk flexed Gait velocity: Decreased   General Gait Details: Cues for posture. Min A for steadying during turns. Pt able to maintain RW at safe distance with no need for cueing. Chair follow was present although did not need to use   Stairs             Wheelchair Mobility    Modified Rankin (Stroke Patients Only)       Balance Overall balance assessment: Needs assistance Sitting-balance support: Feet supported;No upper extremity supported Sitting balance-Leahy Scale: Fair     Standing balance support: Bilateral upper extremity supported;During functional activity Standing balance-Leahy Scale: Poor Standing balance comment: reliant on BUE support and external assist, fairly steady with B UE support, was able to stand using RW for ~30 seconds to don/doff depends/adult diaper                            Cognition Arousal/Alertness: Awake/alert Behavior During Therapy: Anxious Overall Cognitive Status: Impaired/Different from baseline Area of Impairment: Safety/judgement;Following commands;Problem solving;Attention;Awareness                   Current Attention Level: Sustained Memory: Decreased recall of  precautions;Decreased short-term memory Following Commands: Follows one step commands consistently Safety/Judgement: Decreased awareness of safety;Decreased awareness of deficits Awareness: Emergent Problem Solving: Slow processing;Difficulty sequencing;Requires verbal cues;Requires tactile  cues General Comments: Very concerned about which SNF she will be discharged to, initially kept redirecting the conversation to that topic, able to follow simple commands, increased time needed for responding and processing      Exercises      General Comments General comments (skin integrity, edema, etc.): Pt with reports of incontinence and urine leakage. Used depends/adult diaper to mobilize. Pt able to weight shift to either side to pull depends onto thighs in sitting and then able to stand with RW for PT assistance to pull on depends/adult diaper completely. Pt needed verbal reassurance before walking that adult diaper would ensure no leakage.      Pertinent Vitals/Pain Pain Assessment: Faces Faces Pain Scale: Hurts whole lot Pain Location: back of head Pain Descriptors / Indicators: Aching;Sharp;Grimacing Pain Intervention(s): Monitored during session;Repositioned    Home Living                      Prior Function            PT Goals (current goals can now be found in the care plan section) Acute Rehab PT Goals Patient Stated Goal: wants pain to go away PT Goal Formulation: With patient Time For Goal Achievement: 07/06/20 Potential to Achieve Goals: Good    Frequency    Min 3X/week      PT Plan Current plan remains appropriate    Co-evaluation              AM-PAC PT "6 Clicks" Mobility   Outcome Measure  Help needed turning from your back to your side while in a flat bed without using bedrails?: A Little Help needed moving from lying on your back to sitting on the side of a flat bed without using bedrails?: A Lot Help needed moving to and from a bed to a chair (including a wheelchair)?: A Little Help needed standing up from a chair using your arms (e.g., wheelchair or bedside chair)?: A Little Help needed to walk in hospital room?: A Little Help needed climbing 3-5 steps with a railing? : A Lot 6 Click Score: 16    End of Session Equipment  Utilized During Treatment: Cervical collar;Gait belt Activity Tolerance: Patient tolerated treatment well Patient left: with chair alarm set;in chair;with call bell/phone within reach Nurse Communication: Mobility status PT Visit Diagnosis: Unsteadiness on feet (R26.81);Other abnormalities of gait and mobility (R26.89);Pain Pain - part of body:  (neck)     Time: 6808-8110 PT Time Calculation (min) (ACUTE ONLY): 33 min  Charges:  $Gait Training: 8-22 mins $Therapeutic Activity: 8-22 mins                     Rosita Kea, SPT

## 2020-06-27 NOTE — Progress Notes (Signed)
PROGRESS NOTE    Samantha Bishop  WUJ:[id number] DOB: 30-Oct-1924 DOA: 06/21/2020 PCP: Hoyt Koch, MD    Brief Narrative:  87 year old female from home with history of hypertension, dyslipidemia, chronic A. fib on Eliquis, history of PE presented from home on 2/12 due to fall.  She was raking leaves and tripped on a brick.  In the emergency room, a skeletal survey including CT scan of the head and neck, cervical spine done that showed nondisplaced fracture of the base of the dens and C1. She stayed in the hospital with persistent pain and waiting for a SNF bed.   Assessment & Plan:   Principal Problem:   Cervical spine fracture [facility]) Active Problems:   Essential hypertension   Atrial fibrillation - followed by Dr. Caryl Comes   CVA (cerebral vascular accident) [facility])   Intractable pain   Hypokalemia   Fall from ground level   Closed cervical spine fracture ([place])  Close traumatic cervical spine fracture: Fracture of the base of the dens, acute nondisplaced fracture of the anterior arch of C1. Seen by neurosurgery, recommended conservative management.  Aspen collar all the time.  Neurosurgery to schedule follow-up in 2 weeks. with significant pain and discomfort with cervical collar. Use adequate pain medications along with laxatives and continue to work with PT OT. Repeated CT scans with no further complications. Increased doses of oral pain medicine in the hope of decreasing use of IV pain medicine to transition to a skilled nursing rehab.  Not used morphine for last 24 hours.  Hypertension: Blood pressures currently stable on hydrochlorothiazide and Norvasc.  Chronic A. fib: Rate controlled.  Not on any rate control medications.  Therapeutic on Eliquis.  History of a stroke: No evidence of new stroke.  Remains on statin and Eliquis.   DVT prophylaxis: SCDs Start: 06/21/20 2037 apixaban (ELIQUIS) tablet 5 mg   Code Status: Full code Family Communication: Patient's son on  the phone 2/17. Disposition Plan: Status is: Inpatient  Remains inpatient appropriate because:Inpatient level of care appropriate due to severity of illness   Dispo: The patient is from: Home              Anticipated d/c is to: SNF              Anticipated d/c date is: 1 day              Patient currently is currently stable to transfer.   Difficult to place patient No Patient is using more oral medications now.  She can be transferred when bed is available.   Consultants:   Neurosurgery  Procedures:   None  Antimicrobials:   None   Subjective: Patient seen and examined.  No overnight events.  Still has some pain from the cervical collar.  Now having more issues with constipation.  No bowel movement for last 4 days. Objective: Vitals:   06/26/20 0849 06/26/20 2018 06/27/20 0343 06/27/20 0751  BP: (!) 142/64 (!) 158/75 (!) 146/67 (!) 151/70  Pulse: 98 (!) 58 89 70  Resp: 17  20 17   Temp: 98.5 F (36.9 C) 97.8 F (36.6 C) 97.6 F (36.4 C) 98.5 F (36.9 C)  TempSrc: Oral Oral Oral Oral  SpO2: 91% 90% 90% 91%  Weight:      Height:        Intake/Output Summary (Last 24 hours) at 06/27/2020 1332 Last data filed at 06/27/2020 0819 Gross per 24 hour  Intake 240 ml  Output 900 ml  Net -660 ml   Filed Weights   06/21/20 1904 06/21/20 1909  Weight: 81.6 kg 81.6 kg    Examination:  General exam: Appears anxious and in mild pain. She can move all 4 extremities, power and sensation normal in all 4 extremities. Able to swallow without difficulty. Respiratory system: Clear to auscultation. Respiratory effort normal. Cardiovascular system: S1 & S2 heard, RRR. No JVD, murmurs, rubs, gallops or clicks. No pedal edema. Gastrointestinal system: Abdomen is nondistended, soft and nontender. No organomegaly or masses felt. Normal bowel sounds heard. Central nervous system: Alert and oriented. No focal neurological deficits. Extremities: Symmetric 5 x 5 power. Skin: No rashes,  lesions or ulcers Psychiatry: Judgement and insight appear normal. Mood & affect anxious.    Data Reviewed: I have personally reviewed following labs and imaging studies  CBC: Recent Labs  Lab 06/21/20 1910 06/22/20 0211 06/23/20 0403  WBC 9.7 8.7 9.6  NEUTROABS 6.9  --   --   HGB 11.7* 11.0* 10.9*  HCT 37.1 32.6* 35.4*  MCV 91.2 88.1 92.7  PLT 201 185 846   Basic Metabolic Panel: Recent Labs  Lab 06/21/20 1910 06/22/20 0211 06/23/20 0403 06/24/20 0258 06/25/20 0402  NA 138 137 135 134* 137  K 3.3* 3.4* 3.9 3.6 3.4*  CL 103 100 99 99 99  CO2 22 26 23 27 28   GLUCOSE 122* 143* 107* 125* 132*  BUN 11 10 15 11 18   CREATININE 0.55 0.52 0.68 0.56 0.66  CALCIUM 9.2 9.2 9.2 9.1 9.4  MG  --  1.9  --   --   --   PHOS  --  3.7  --   --   --    GFR: Estimated Creatinine Clearance: 47.3 mL/min (by C-G formula based on SCr of 0.66 mg/dL). Liver Function Tests: Recent Labs  Lab 06/22/20 0211  AST 18  ALT 13  ALKPHOS 59  BILITOT 0.9  PROT 6.7  ALBUMIN 3.7   No results for input(s): LIPASE, AMYLASE in the last 168 hours. No results for input(s): AMMONIA in the last 168 hours. Coagulation Profile: Recent Labs  Lab 06/21/20 1910 06/22/20 0211  INR 1.1 1.3*   Cardiac Enzymes: No results for input(s): CKTOTAL, CKMB, CKMBINDEX, TROPONINI in the last 168 hours. BNP (last 3 results) No results for input(s): PROBNP in the last 8760 hours. HbA1C: No results for input(s): HGBA1C in the last 72 hours. CBG: No results for input(s): GLUCAP in the last 168 hours. Lipid Profile: No results for input(s): CHOL, HDL, LDLCALC, TRIG, CHOLHDL, LDLDIRECT in the last 72 hours. Thyroid Function Tests: No results for input(s): TSH, T4TOTAL, FREET4, T3FREE, THYROIDAB in the last 72 hours. Anemia Panel: No results for input(s): VITAMINB12, FOLATE, FERRITIN, TIBC, IRON, RETICCTPCT in the last 72 hours. Sepsis Labs: No results for input(s): PROCALCITON, LATICACIDVEN in the last 168  hours.  Recent Results (from the past 240 hour(s))  Resp Panel by RT-PCR (Flu A&B, Covid) Nasopharyngeal Swab     Status: None   Collection Time: 06/21/20  7:31 PM   Specimen: Nasopharyngeal Swab; Nasopharyngeal(NP) swabs in vial transport medium  Result Value Ref Range Status   SARS Coronavirus 2 by RT PCR NEGATIVE NEGATIVE Final    Comment: (NOTE) SARS-CoV-2 target nucleic acids are NOT DETECTED.  The SARS-CoV-2 RNA is generally detectable in upper respiratory specimens during the acute phase of infection. The lowest concentration of SARS-CoV-2 viral copies this assay can detect is 138 copies/mL. A negative result does not preclude SARS-Cov-2  infection and should not be used as the sole basis for treatment or other patient management decisions. A negative result may occur with  improper specimen collection/handling, submission of specimen other than nasopharyngeal swab, presence of viral mutation(s) within the areas targeted by this assay, and inadequate number of viral copies(<138 copies/mL). A negative result must be combined with clinical observations, patient history, and epidemiological information. The expected result is Negative.  Fact Sheet for Patients:  EntrepreneurPulse.com.au  Fact Sheet for Healthcare Providers:  IncredibleEmployment.be  This test is no t yet approved or cleared by the [place] FDA and  has been authorized for detection and/or diagnosis of SARS-CoV-2 by FDA under an Emergency Use Authorization (EUA). This EUA will remain  in effect (meaning this test can be used) for the duration of the COVID-19 declaration under Section 564(b)(1) of the Act, 21 U.S.C.section 360bbb-3(b)(1), unless the authorization is terminated  or revoked sooner.       Influenza A by PCR NEGATIVE NEGATIVE Final   Influenza B by PCR NEGATIVE NEGATIVE Final    Comment: (NOTE) The Xpert Xpress SARS-CoV-2/FLU/RSV plus assay is intended  as an aid in the diagnosis of influenza from Nasopharyngeal swab specimens and should not be used as a sole basis for treatment. Nasal washings and aspirates are unacceptable for Xpert Xpress SARS-CoV-2/FLU/RSV testing.  Fact Sheet for Patients: EntrepreneurPulse.com.au  Fact Sheet for Healthcare Providers: IncredibleEmployment.be  This test is not yet approved or cleared by the [place] FDA and has been authorized for detection and/or diagnosis of SARS-CoV-2 by FDA under an Emergency Use Authorization (EUA). This EUA will remain in effect (meaning this test can be used) for the duration of the COVID-19 declaration under Section 564(b)(1) of the Act, 21 U.S.C. section 360bbb-3(b)(1), unless the authorization is terminated or revoked.  Performed at [facility], [place] [address] [place], [place] [address]          Radiology Studies: CT HEAD WO CONTRAST  Result Date: 06/26/2020 CLINICAL DATA:  Head trauma, moderate/severe, severe persistent headache, trauma on Eliquis. EXAM: CT HEAD WITHOUT CONTRAST TECHNIQUE: Contiguous axial images were obtained from the base of the skull through the vertex without intravenous contrast. COMPARISON:  Prior head CT examinations 06/22/2020 and earlier. FINDINGS: Brain: Mild cerebral atrophy. Redemonstrated chronic cortical/subcortical infarcts within the right parietal and left occipital lobes. Redemonstrated chronic infarct within the medial left cerebellum. Stable background cerebral white matter chronic small vessel ischemic disease. There is no acute intracranial hemorrhage. No acute demarcated cortical infarct. No extra-axial fluid collection. No evidence of intracranial mass. No midline shift. Vascular: No hyperdense vessel.  Atherosclerotic calcifications. Skull: Normal. Negative for fracture or focal lesion. Sinuses/Orbits: Visualized orbits show no acute finding. Left maxillary sinus mucous retention  cyst. Trace bilateral ethmoid sinus mucosal thickening. Other: Known acute fractures of the dens and C1 anterior arch, incompletely imaged. IMPRESSION: No evidence of acute intracranial abnormality. Unchanged generalized atrophy of the brain, chronic small vessel ischemic disease and chronic infarcts as described. Known acute fractures of the dens and C1 anterior arch, incompletely imaged on the current exam. Trace ethmoid sinus mucosal thickening. Left maxillary sinus mucous retention cyst. Electronically Signed   By: Kellie Simmering DO   On: 06/26/2020 11:30        Scheduled Meds: . amLODipine  5 mg Oral Daily  . apixaban  5 mg Oral BID  . cholecalciferol  2,000 Units Oral Daily  . docusate sodium  100 mg Oral BID  . fluticasone  2 spray Each Nare Daily  . gabapentin  600 mg Oral TID  . hydrochlorothiazide  12.5 mg Oral Daily  . polyethylene glycol  17 g Oral Daily  . pravastatin  20 mg Oral q1800  . senna  1 tablet Oral BID   Continuous Infusions: . lactated ringers 10 mL/hr at 06/23/20 1305  . methocarbamol (ROBAXIN) IV       LOS: 5 days    Time spent: 30 minutes    Barb Merino, MD [facility] Pager [phone]

## 2020-06-28 DIAGNOSIS — S12000A Unspecified displaced fracture of first cervical vertebra, initial encounter for closed fracture: Secondary | ICD-10-CM | POA: Diagnosis not present

## 2020-06-28 MED ORDER — MAGNESIUM CITRATE PO SOLN
1.0000 | Freq: Once | ORAL | Status: AC
  Administered 2020-06-28: 1 via ORAL
  Filled 2020-06-28: qty 296

## 2020-06-28 MED ORDER — BISACODYL 10 MG RE SUPP
10.0000 mg | Freq: Once | RECTAL | Status: AC
  Administered 2020-06-28: 10 mg via RECTAL
  Filled 2020-06-28: qty 1

## 2020-06-28 NOTE — Plan of Care (Signed)
  Problem: Education: Goal: Knowledge of General Education information will improve Description: Including pain rating scale, medication(s)/side effects and non-pharmacologic comfort measures Outcome: Progressing   Problem: Health Behavior/Discharge Planning: Goal: Ability to manage health-related needs will improve Outcome: Progressing   Problem: Clinical Measurements: Goal: Ability to maintain clinical measurements within normal limits will improve Outcome: Progressing Goal: Will remain free from infection Outcome: Progressing Goal: Diagnostic test results will improve Outcome: Progressing Goal: Respiratory complications will improve Outcome: Progressing Goal: Cardiovascular complication will be avoided Outcome: Progressing   Problem: Activity: Goal: Risk for activity intolerance will decrease Outcome: Progressing   Problem: Nutrition: Goal: Adequate nutrition will be maintained Outcome: Progressing   Problem: Coping: Goal: Level of anxiety will decrease Outcome: Progressing   Problem: Elimination: Goal: Will not experience complications related to bowel motility Outcome: Progressing Goal: Will not experience complications related to urinary retention Outcome: Progressing   Problem: Pain Managment: Goal: General experience of comfort will improve Outcome: Progressing   Problem: Safety: Goal: Ability to remain free from injury will improve Outcome: Progressing   Problem: Skin Integrity: Goal: Risk for impaired skin integrity will decrease Outcome: Progressing   Problem: Activity: Goal: Ability to avoid complications of mobility impairment will improve Outcome: Progressing Goal: Ability to tolerate increased activity will improve Outcome: Progressing   Problem: Education: Goal: Verbalization of understanding the information provided will improve Outcome: Progressing   Problem: Coping: Goal: Level of anxiety will decrease Outcome: Progressing   Problem:  Physical Regulation: Goal: Postoperative complications will be avoided or minimized Outcome: Progressing   Problem: Respiratory: Goal: Ability to maintain a clear airway will improve Outcome: Progressing   Problem: Pain Management: Goal: Pain level will decrease Outcome: Progressing   Problem: Skin Integrity: Goal: Signs of wound healing will improve Outcome: Progressing   Problem: Tissue Perfusion: Goal: Ability to maintain adequate tissue perfusion will improve Outcome: Progressing

## 2020-06-28 NOTE — Plan of Care (Signed)
  Problem: Education: ?Goal: Knowledge of General Education information will improve ?Description: Including pain rating scale, medication(s)/side effects and non-pharmacologic comfort measures ?Outcome: Progressing ?  ?Problem: Health Behavior/Discharge Planning: ?Goal: Ability to manage health-related needs will improve ?Outcome: Progressing ?  ?Problem: Clinical Measurements: ?Goal: Ability to maintain clinical measurements within normal limits will improve ?Outcome: Progressing ?  ?Problem: Activity: ?Goal: Risk for activity intolerance will decrease ?Outcome: Progressing ?  ?Problem: Coping: ?Goal: Level of anxiety will decrease ?Outcome: Progressing ?  ?Problem: Pain Managment: ?Goal: General experience of comfort will improve ?Outcome: Progressing ?  ?

## 2020-06-28 NOTE — Progress Notes (Signed)
Patient's friend called and asked RN about pt's discharge plan. RN explained that only designated contact can get patient's POC due to HIPPAA policy. Friend verbalized understanding and told RN that she will call pt's son to obtain some information.

## 2020-06-28 NOTE — Progress Notes (Signed)
PROGRESS NOTE    Samantha Bishop  HBZ:[id number] DOB: 1925/02/11 DOA: 06/21/2020 PCP: Hoyt Koch, MD    Brief Narrative:  87 year old female from home with history of hypertension, dyslipidemia, chronic A. fib on Eliquis, history of PE presented from home on 2/12 due to fall.  She was raking leaves and tripped on a brick.  In the emergency room, a skeletal survey including CT scan of the head and neck, cervical spine done that showed nondisplaced fracture of the base of the dens and C1. She stayed in the hospital with persistent pain and waiting for a SNF bed.   Assessment & Plan:   Principal Problem:   Cervical spine fracture [facility]) Active Problems:   Essential hypertension   Atrial fibrillation - followed by Dr. Caryl Comes   CVA (cerebral vascular accident) [facility])   Intractable pain   Hypokalemia   Fall from ground level   Closed cervical spine fracture ([place])  Close traumatic cervical spine fracture: Fracture of the base of the dens, acute nondisplaced fracture of the anterior arch of C1. Seen by neurosurgery, recommended conservative management.  Aspen collar all the time.  Neurosurgery to schedule follow-up in 2 weeks. with significant pain and discomfort with cervical collar. Use adequate pain medications along with laxatives and continue to work with PT OT. Repeated CT scans with no further complications. Patient with significant constipation, repeat magnesium citrate today along with Dulcolax suppository.  Hypertension: Blood pressures currently stable on hydrochlorothiazide and Norvasc.  Chronic A. fib: Rate controlled.  Not on any rate control medications.  Therapeutic on Eliquis.  History of a stroke: No evidence of new stroke.  Remains on statin and Eliquis.   DVT prophylaxis: SCDs Start: 06/21/20 2037 apixaban (ELIQUIS) tablet 5 mg   Code Status: Full code Family Communication: Patient's son on the phone 2/17.  Called 2/19, unable to reach. Disposition  Plan: Status is: Inpatient  Remains inpatient appropriate because:Inpatient level of care appropriate due to severity of illness   Dispo: The patient is from: Home              Anticipated d/c is to: SNF              Anticipated d/c date is: 1 day              Patient currently is currently stable to transfer.   Difficult to place patient No Patient is using more oral medications now.  She can be transferred when bed is available.   Consultants:   Neurosurgery  Procedures:   None  Antimicrobials:   None   Subjective: Patient seen and examined.  She was in the commode, crying with pain mostly on the posterior occipital area.  No complications noted.  Her mobility has improved.  No bowel movement yet with magnesium citrate yesterday. Objective: Vitals:   06/27/20 1935 06/28/20 0425 06/28/20 0816 06/28/20 0950  BP: 134/68 (!) 144/60 (!) 170/73 (!) 170/73  Pulse: 90 86 81   Resp: 17 17 17    Temp: 98.7 F (37.1 C) 98.3 F (36.8 C) 97.9 F (36.6 C)   TempSrc: Oral Oral Oral   SpO2: 93% 95% 93%   Weight:      Height:        Intake/Output Summary (Last 24 hours) at 06/28/2020 1057 Last data filed at 06/28/2020 0900 Gross per 24 hour  Intake 240 ml  Output 801 ml  Net -561 ml   Filed Weights   06/21/20 1904 06/21/20 1909  Weight: 81.6 kg 81.6 kg    Examination:  General: Anxious, in mild distress. Cardiovascular: S1-S2 present. Respiratory: Bilateral clear Gastrointestinal: Soft nontender. Ext: No neuro deficits.  Equal motor and sensory power in all 4 extremities.  IV lines: None Catheters: None     Data Reviewed: I have personally reviewed following labs and imaging studies  CBC: Recent Labs  Lab 06/21/20 1910 06/22/20 0211 06/23/20 0403  WBC 9.7 8.7 9.6  NEUTROABS 6.9  --   --   HGB 11.7* 11.0* 10.9*  HCT 37.1 32.6* 35.4*  MCV 91.2 88.1 92.7  PLT 201 185 998   Basic Metabolic Panel: Recent Labs  Lab 06/21/20 1910 06/22/20 0211  06/23/20 0403 06/24/20 0258 06/25/20 0402  NA 138 137 135 134* 137  K 3.3* 3.4* 3.9 3.6 3.4*  CL 103 100 99 99 99  CO2 22 26 23 27 28   GLUCOSE 122* 143* 107* 125* 132*  BUN 11 10 15 11 18   CREATININE 0.55 0.52 0.68 0.56 0.66  CALCIUM 9.2 9.2 9.2 9.1 9.4  MG  --  1.9  --   --   --   PHOS  --  3.7  --   --   --    GFR: Estimated Creatinine Clearance: 47.3 mL/min (by C-G formula based on SCr of 0.66 mg/dL). Liver Function Tests: Recent Labs  Lab 06/22/20 0211  AST 18  ALT 13  ALKPHOS 59  BILITOT 0.9  PROT 6.7  ALBUMIN 3.7   No results for input(s): LIPASE, AMYLASE in the last 168 hours. No results for input(s): AMMONIA in the last 168 hours. Coagulation Profile: Recent Labs  Lab 06/21/20 1910 06/22/20 0211  INR 1.1 1.3*   Cardiac Enzymes: No results for input(s): CKTOTAL, CKMB, CKMBINDEX, TROPONINI in the last 168 hours. BNP (last 3 results) No results for input(s): PROBNP in the last 8760 hours. HbA1C: No results for input(s): HGBA1C in the last 72 hours. CBG: No results for input(s): GLUCAP in the last 168 hours. Lipid Profile: No results for input(s): CHOL, HDL, LDLCALC, TRIG, CHOLHDL, LDLDIRECT in the last 72 hours. Thyroid Function Tests: No results for input(s): TSH, T4TOTAL, FREET4, T3FREE, THYROIDAB in the last 72 hours. Anemia Panel: No results for input(s): VITAMINB12, FOLATE, FERRITIN, TIBC, IRON, RETICCTPCT in the last 72 hours. Sepsis Labs: No results for input(s): PROCALCITON, LATICACIDVEN in the last 168 hours.  Recent Results (from the past 240 hour(s))  Resp Panel by RT-PCR (Flu A&B, Covid) Nasopharyngeal Swab     Status: None   Collection Time: 06/21/20  7:31 PM   Specimen: Nasopharyngeal Swab; Nasopharyngeal(NP) swabs in vial transport medium  Result Value Ref Range Status   SARS Coronavirus 2 by RT PCR NEGATIVE NEGATIVE Final    Comment: (NOTE) SARS-CoV-2 target nucleic acids are NOT DETECTED.  The SARS-CoV-2 RNA is generally detectable  in upper respiratory specimens during the acute phase of infection. The lowest concentration of SARS-CoV-2 viral copies this assay can detect is 138 copies/mL. A negative result does not preclude SARS-Cov-2 infection and should not be used as the sole basis for treatment or other patient management decisions. A negative result may occur with  improper specimen collection/handling, submission of specimen other than nasopharyngeal swab, presence of viral mutation(s) within the areas targeted by this assay, and inadequate number of viral copies(<138 copies/mL). A negative result must be combined with clinical observations, patient history, and epidemiological information. The expected result is Negative.  Fact Sheet for Patients:  EntrepreneurPulse.com.au  Fact  Sheet for Healthcare Providers:  IncredibleEmployment.be  This test is no t yet approved or cleared by the [place] FDA and  has been authorized for detection and/or diagnosis of SARS-CoV-2 by FDA under an Emergency Use Authorization (EUA). This EUA will remain  in effect (meaning this test can be used) for the duration of the COVID-19 declaration under Section 564(b)(1) of the Act, 21 U.S.C.section 360bbb-3(b)(1), unless the authorization is terminated  or revoked sooner.       Influenza A by PCR NEGATIVE NEGATIVE Final   Influenza B by PCR NEGATIVE NEGATIVE Final    Comment: (NOTE) The Xpert Xpress SARS-CoV-2/FLU/RSV plus assay is intended as an aid in the diagnosis of influenza from Nasopharyngeal swab specimens and should not be used as a sole basis for treatment. Nasal washings and aspirates are unacceptable for Xpert Xpress SARS-CoV-2/FLU/RSV testing.  Fact Sheet for Patients: EntrepreneurPulse.com.au  Fact Sheet for Healthcare Providers: IncredibleEmployment.be  This test is not yet approved or cleared by the [place] FDA and has  been authorized for detection and/or diagnosis of SARS-CoV-2 by FDA under an Emergency Use Authorization (EUA). This EUA will remain in effect (meaning this test can be used) for the duration of the COVID-19 declaration under Section 564(b)(1) of the Act, 21 U.S.C. section 360bbb-3(b)(1), unless the authorization is terminated or revoked.  Performed at [facility], [place] [address], [place], [place] [address]          Radiology Studies: No results found.      Scheduled Meds: . amLODipine  5 mg Oral Daily  . apixaban  5 mg Oral BID  . cholecalciferol  2,000 Units Oral Daily  . docusate sodium  100 mg Oral BID  . fluticasone  2 spray Each Nare Daily  . gabapentin  600 mg Oral TID  . hydrochlorothiazide  12.5 mg Oral Daily  . polyethylene glycol  17 g Oral Daily  . pravastatin  20 mg Oral q1800  . senna  1 tablet Oral BID   Continuous Infusions: . lactated ringers 10 mL/hr at 06/23/20 1305  . methocarbamol (ROBAXIN) IV       LOS: 6 days    Time spent: 30 minutes    Barb Merino, MD [facility] Pager [phone]

## 2020-06-28 NOTE — TOC Progression Note (Signed)
Transition of Care [facility]) - Progression Note    Patient Details  Name: Samantha Bishop MRN: [id number] Date of Birth: August 19, 1924  Transition of Care [facility]) CM/SW [place], [place] Phone Number: 06/28/2020, 2:17 PM  Clinical Narrative:     CSW gave pt bed offers. Pt requested CSW call son [name]. CSW called david and requested a call back.   Expected Discharge Plan: [place] (from home alone) Barriers to Discharge: Continued Medical Work up  Expected Discharge Plan and Services Expected Discharge Plan: [place] (from home alone)   Discharge Planning Services: CM Consult   Living arrangements for the past 2 months: Single Family Home                                       Social Determinants of Health (SDOH) Interventions    Readmission Risk Interventions No flowsheet data found.  Emeterio Reeve, Latanya Presser, [place] Social Worker [phone]

## 2020-06-28 NOTE — Plan of Care (Signed)
  Problem: Education: Goal: Knowledge of General Education information will improve Description Including pain rating scale, medication(s)/side effects and non-pharmacologic comfort measures Outcome: Progressing   Problem: Activity: Goal: Risk for activity intolerance will decrease Outcome: Progressing   Problem: Safety: Goal: Ability to remain free from injury will improve Outcome: Progressing   

## 2020-06-28 NOTE — Progress Notes (Signed)
Patient reports having more pain than she had expected.  The patient has been medicated as ordered for pain.  C Collar remains in place.  Patient states that the back of her head hurts the most.  Encouraged relaxation and other methods to decrease pain.

## 2020-06-29 DIAGNOSIS — I10 Essential (primary) hypertension: Secondary | ICD-10-CM | POA: Diagnosis not present

## 2020-06-29 DIAGNOSIS — I480 Paroxysmal atrial fibrillation: Secondary | ICD-10-CM | POA: Diagnosis not present

## 2020-06-29 DIAGNOSIS — S12000A Unspecified displaced fracture of first cervical vertebra, initial encounter for closed fracture: Secondary | ICD-10-CM | POA: Diagnosis not present

## 2020-06-29 LAB — SARS CORONAVIRUS 2 (TAT 6-24 HRS): SARS Coronavirus 2: NEGATIVE

## 2020-06-29 MED ORDER — SODIUM CHLORIDE 0.9% FLUSH
3.0000 mL | INTRAVENOUS | Status: DC | PRN
  Administered 2020-06-29: 3 mL via INTRAVENOUS

## 2020-06-29 MED ORDER — POLYETHYLENE GLYCOL 3350 17 G PO PACK: 17.0000 g | PACK | Freq: Every day | ORAL | 0 refills | Status: DC | PRN

## 2020-06-29 MED ORDER — SODIUM CHLORIDE 0.9% FLUSH
3.0000 mL | Freq: Two times a day (BID) | INTRAVENOUS | Status: DC
  Administered 2020-06-29 (×2): 3 mL via INTRAVENOUS

## 2020-06-29 NOTE — Discharge Summary (Signed)
Physician Discharge Summary  GIANELLE MCCAUL DTO:[id number] DOB: 09/27/1924 DOA: 06/21/2020  PCP: Hoyt Koch, MD  Admit date: 06/21/2020 Discharge date: 06/29/2020  Admitted From: Home Disposition: Skilled nursing facility  Recommendations for Outpatient Follow-up:  1. Follow up with PCP in 1-2 weeks 2. Schedule follow-up with neurosurgery in 2 weeks.  Home Health: Not applicable Equipment/Devices: Not applicable  Discharge Condition: Stable CODE STATUS: Full code Diet recommendation: Low-salt diet  Discharge summary:  87 year old female from home with history of hypertension, dyslipidemia, chronic A. fib on Eliquis, history of PE presented from home on 2/12 due to fall.  She was raking leaves and tripped on a brick.  In the emergency room, a skeletal survey including CT scan of the head and neck, cervical spine done that showed nondisplaced fracture of the base of the dens and C1. She stayed in the hospital with persistent pain and waiting for a SNF bed.   Assessment & Plan of care:  # Close traumatic cervical spine fracture: Fracture of the base of the dens, acute nondisplaced fracture of the anterior arch of C1. Seen by neurosurgery, recommended conservative management.  Aspen collar all the time.  Neurosurgery to schedule follow-up in 2 weeks. with significant pain and discomfort with cervical collar. Use adequate pain medications along with laxatives and continue to work with PT OT. Repeated CT scans with no further complications. Patient developed significant constipation and needed aggressive laxatives and suppository with relief. Please keep on scheduled stool softener, MiraLAX as needed. Pain is well controlled on oral oxycodone as well as occasional dose of Ativan now.  # Hypertension: Blood pressures currently stable on hydrochlorothiazide and Norvasc.  # Chronic A. fib: Rate controlled.  Not on any rate control medications.  Therapeutic on Eliquis.  #  History of a stroke: No evidence of new stroke.  Remains on statin and Eliquis.  Patient is medically stable to transfer to skilled level of care.  Waiting for bed availability.   Discharge Diagnoses:  Principal Problem:   Cervical spine fracture (HCC) Active Problems:   Essential hypertension   Atrial fibrillation - followed by Dr. Caryl Comes   CVA (cerebral vascular accident) [facility])   Intractable pain   Hypokalemia   Fall from ground level   Closed cervical spine fracture [facility])    Discharge Instructions  Discharge Instructions    Call MD for:  difficulty breathing, headache or visual disturbances   Complete by: As directed    Call MD for:  severe uncontrolled pain   Complete by: As directed    Diet - low sodium heart healthy   Complete by: As directed    Discharge instructions   Complete by: As directed    She will keep cervical collar all the time until seen by neurosurgery on follow-up.   Increase activity slowly   Complete by: As directed    Increase activity slowly   Complete by: As directed    Other Restrictions   Complete by: As directed    To wear collar all the time     Allergies as of 06/29/2020      Reactions   Codeine Nausea Only   Penicillins Rash      Medication List    TAKE these medications   apixaban 5 MG Tabs tablet Commonly known as: Eliquis TAKE 1 TABLET (5 MG TOTAL) BY MOUTH 2 (TWO) TIMES DAILY. What changed:   how much to take  how to take this  when to take this  additional instructions   fluticasone 50 MCG/ACT nasal spray Commonly known as: FLONASE Place 2 sprays into both nostrils daily. What changed: when to take this   gabapentin 300 MG capsule Commonly known as: NEURONTIN Take 2 capsules (600 mg total) by mouth 3 (three) times daily.   hydrochlorothiazide 25 MG tablet Commonly known as: HYDRODIURIL Take 0.5 tablets (12.5 mg total) by mouth daily.   LORazepam 0.5 MG tablet Commonly known as: Ativan Take 1 tablet (0.5 mg  total) by mouth every 8 (eight) hours as needed for up to 5 days for anxiety or sleep.   multivitamin with minerals Tabs tablet Take 1 tablet by mouth daily.   NON FORMULARY Diet order: Regular Diet.   oxyCODONE 5 MG immediate release tablet Commonly known as: Oxy IR/ROXICODONE Take 1 tablet (5 mg total) by mouth every 4 (four) hours as needed for up to 5 days for moderate pain or severe pain.   polyethylene glycol 17 g packet Commonly known as: MIRALAX / GLYCOLAX Take 17 g by mouth daily as needed for moderate constipation.   pravastatin 20 MG tablet Commonly known as: PRAVACHOL Take 1 tablet (20 mg total) by mouth daily at 6 PM.   senna 8.6 MG Tabs tablet Commonly known as: SENOKOT Take 1 tablet (8.6 mg total) by mouth 2 (two) times daily.   sodium chloride 0.65 % Soln nasal spray Commonly known as: OCEAN Place 1 spray into both nostrils as needed for congestion. What changed: when to take this   Vitamin D3 25 MCG tablet Commonly known as: Vitamin D Take 2 tablets (2,000 Units total) by mouth daily.       Contact information for follow-up providers    Hoyt Koch, MD Follow up in 2 week(s).   Specialty: Internal Medicine Contact information: [place] [address] [phone]        Consuella Lose, MD. Schedule an appointment as soon as possible for a visit in 2 week(s).   Specialty: Neurosurgery Contact information: [address] [place] [address] [place] [address] [phone]            Contact information for after-discharge care    Destination    [facility] .   Service: Skilled Nursing Contact information: [place] [address] [phone]                 Allergies  Allergen Reactions  . Codeine Nausea Only  . Penicillins Rash    Consultations:  Neurosurgery   Procedures/Studies: CT HEAD WO CONTRAST  Result Date:  06/26/2020 CLINICAL DATA:  Head trauma, moderate/severe, severe persistent headache, trauma on Eliquis. EXAM: CT HEAD WITHOUT CONTRAST TECHNIQUE: Contiguous axial images were obtained from the base of the skull through the vertex without intravenous contrast. COMPARISON:  Prior head CT examinations 06/22/2020 and earlier. FINDINGS: Brain: Mild cerebral atrophy. Redemonstrated chronic cortical/subcortical infarcts within the right parietal and left occipital lobes. Redemonstrated chronic infarct within the medial left cerebellum. Stable background cerebral white matter chronic small vessel ischemic disease. There is no acute intracranial hemorrhage. No acute demarcated cortical infarct. No extra-axial fluid collection. No evidence of intracranial mass. No midline shift. Vascular: No hyperdense vessel.  Atherosclerotic calcifications. Skull: Normal. Negative for fracture or focal lesion. Sinuses/Orbits: Visualized orbits show no acute finding. Left maxillary sinus mucous retention cyst. Trace bilateral ethmoid sinus mucosal thickening. Other: Known acute fractures of the dens and C1 anterior arch, incompletely imaged. IMPRESSION: No evidence  of acute intracranial abnormality. Unchanged generalized atrophy of the brain, chronic small vessel ischemic disease and chronic infarcts as described. Known acute fractures of the dens and C1 anterior arch, incompletely imaged on the current exam. Trace ethmoid sinus mucosal thickening. Left maxillary sinus mucous retention cyst. Electronically Signed   By: Kellie Simmering DO   On: 06/26/2020 11:30   CT HEAD WO CONTRAST  Result Date: 06/22/2020 CLINICAL DATA:  Poly trauma, critical, head/cervical spine injury suspected. Severe headache, fall. EXAM: CT HEAD WITHOUT CONTRAST TECHNIQUE: Contiguous axial images were obtained from the base of the skull through the vertex without intravenous contrast. COMPARISON:  Noncontrast head CT 06/21/2020 FINDINGS: Brain: Mild cerebral atrophy.  Redemonstrated chronic cortical/subcortical infarcts within the right parietal lobe and left occipital lobes. Redemonstrated small chronic infarct within the left cerebellar hemisphere. Patchy and ill-defined hypoattenuation within the cerebral white matter is nonspecific, but compatible with chronic small vessel ischemic disease. There is no acute intracranial hemorrhage. No acute demarcated cortical infarct. No extra-axial fluid collection. No evidence of intracranial mass. No midline shift. Vascular: No hyperdense vessel.  Atherosclerotic calcifications. Skull: Normal. Negative for fracture or focal lesion. Sinuses/Orbits: Visualized orbits show no acute finding. Mild scattered paranasal sinus mucosal thickening. Left maxillary sinus mucous retention cyst. Other: Known acute type 2 dens fracture incompletely assessed. Known acute fracture of the C1 anterior arch. IMPRESSION: No evidence of acute intracranial abnormality. Redemonstrated generalized atrophy, chronic small vessel ischemic disease and chronic infarcts as described. Known acute type 2 dens fracture and acute fracture of the C1 anterior arch. Paranasal sinus disease as described. Electronically Signed   By: Kellie Simmering DO   On: 06/22/2020 12:09   CT Head Wo Contrast  Result Date: 06/21/2020 CLINICAL DATA:  Fall with head trauma.  Anticoagulated. EXAM: CT HEAD WITHOUT CONTRAST CT CERVICAL SPINE WITHOUT CONTRAST TECHNIQUE: Multidetector CT imaging of the head and cervical spine was performed following the standard protocol without intravenous contrast. Multiplanar CT image reconstructions of the cervical spine were also generated. COMPARISON:  Head CT 01/04/2019 FINDINGS: CT HEAD FINDINGS Brain: No abnormality affects the brainstem. Old inferior cerebellar stroke on the left. Old left PCA territory infarction affecting the left occipital lobe. Old right parietal cortical and subcortical infarction. Chronic small-vessel ischemic changes diffusely  throughout the hemispheric white matter. No sign of acute infarction, mass lesion, hemorrhage, hydrocephalus or extra-axial collection. Vascular: There is atherosclerotic calcification of the major vessels at the base of the brain. Skull: Negative.  No skull fracture. Sinuses/Orbits: Clear/normal Other: None CT CERVICAL SPINE FINDINGS Alignment: Normal Skull base and vertebrae: Acute nondisplaced fracture at the base of the dens, type 2. Acute nondisplaced fracture of the anterior arch of C1. No other regional injury. Soft tissues and spinal canal: No soft tissue finding. Disc levels:  Chronic arthritis at the C1-2 articulation. C2-3: Chronic facet fusion.  No compressive stenosis. C3-4: Facet osteoarthritis. Bulging of the disc. Foraminal stenosis left worse than right. C4-5: Chronic fusion.  No stenosis. C5-6: Facet osteoarthritis on the left. Mild left bony foraminal stenosis. C6-7: Degenerative spondylosis with endplate osteophytes. Facet arthritis on the left. Mild left foraminal narrowing. C7-T1: Chronic facet arthritis.  No significant stenosis. Upper chest: Negative Other: None IMPRESSION: HEAD CT: No acute or traumatic finding. Old infarctions as outlined above. CERVICAL SPINE CT: 1. Acute nondisplaced fracture at the base of the dens, type 2. Acute nondisplaced fracture of the anterior arch of C1. No other regional injury. 2. Chronic degenerative changes as outlined above. 3.  These results were called by telephone at the time of interpretation on 06/21/2020 at 7:44 pm to provider [facility] , who verbally acknowledged these results. Electronically Signed   By: Nelson Chimes M.D.   On: 06/21/2020 19:44   CT Cervical Spine Wo Contrast  Result Date: 06/21/2020 CLINICAL DATA:  Fall with head trauma.  Anticoagulated. EXAM: CT HEAD WITHOUT CONTRAST CT CERVICAL SPINE WITHOUT CONTRAST TECHNIQUE: Multidetector CT imaging of the head and cervical spine was performed following the standard protocol without  intravenous contrast. Multiplanar CT image reconstructions of the cervical spine were also generated. COMPARISON:  Head CT 01/04/2019 FINDINGS: CT HEAD FINDINGS Brain: No abnormality affects the brainstem. Old inferior cerebellar stroke on the left. Old left PCA territory infarction affecting the left occipital lobe. Old right parietal cortical and subcortical infarction. Chronic small-vessel ischemic changes diffusely throughout the hemispheric white matter. No sign of acute infarction, mass lesion, hemorrhage, hydrocephalus or extra-axial collection. Vascular: There is atherosclerotic calcification of the major vessels at the base of the brain. Skull: Negative.  No skull fracture. Sinuses/Orbits: Clear/normal Other: None CT CERVICAL SPINE FINDINGS Alignment: Normal Skull base and vertebrae: Acute nondisplaced fracture at the base of the dens, type 2. Acute nondisplaced fracture of the anterior arch of C1. No other regional injury. Soft tissues and spinal canal: No soft tissue finding. Disc levels:  Chronic arthritis at the C1-2 articulation. C2-3: Chronic facet fusion.  No compressive stenosis. C3-4: Facet osteoarthritis. Bulging of the disc. Foraminal stenosis left worse than right. C4-5: Chronic fusion.  No stenosis. C5-6: Facet osteoarthritis on the left. Mild left bony foraminal stenosis. C6-7: Degenerative spondylosis with endplate osteophytes. Facet arthritis on the left. Mild left foraminal narrowing. C7-T1: Chronic facet arthritis.  No significant stenosis. Upper chest: Negative Other: None IMPRESSION: HEAD CT: No acute or traumatic finding. Old infarctions as outlined above. CERVICAL SPINE CT: 1. Acute nondisplaced fracture at the base of the dens, type 2. Acute nondisplaced fracture of the anterior arch of C1. No other regional injury. 2. Chronic degenerative changes as outlined above. 3. These results were called by telephone at the time of interpretation on 06/21/2020 at 7:44 pm to provider [facility] ,  who verbally acknowledged these results. Electronically Signed   By: Nelson Chimes M.D.   On: 06/21/2020 19:44   DG Chest Portable 1 View  Result Date: 06/21/2020 CLINICAL DATA:  Fall at home. EXAM: PORTABLE CHEST 1 VIEW COMPARISON:  Radiograph 06/26/2016 FINDINGS: Chronic cardiomegaly, not significantly changed. Stable mediastinal contours with aortic atherosclerosis. No pneumothorax or large pleural effusion. No visualized rib fracture. No focal airspace disease. Mild interstitial coarsening which is chronic. The bones are under mineralized. IMPRESSION: 1. No evidence of acute traumatic injury to the thorax. 2. Stable cardiomegaly. Electronically Signed   By: Keith Rake M.D.   On: 06/21/2020 19:58    (Echo, Carotid, EGD, Colonoscopy, ERCP)    Subjective: Patient seen and examined.  Very happy to have constipation relieved.  Still has some pain on the back of the head but much better than before.   Discharge Exam: Vitals:   06/29/20 0300 06/29/20 0755  BP: 132/65 125/78  Pulse: 78 78  Resp: 16 14  Temp: 98.2 F (36.8 C) 97.9 F (36.6 C)  SpO2: 96% 97%   Vitals:   06/28/20 1456 06/28/20 1945 06/29/20 0300 06/29/20 0755  BP: 130/63 137/60 132/65 125/78  Pulse: 85 84 78 78  Resp: 16 17 16 14   Temp: 98.3 F (36.8 C) 98.1  F (36.7 C) 98.2 F (36.8 C) 97.9 F (36.6 C)  TempSrc: Oral Oral Oral Oral  SpO2: 94% 95% 96% 97%  Weight:      Height:        General: Pt is alert, awake, not in acute distress Sitting upright with cervical collar on. Slightly anxious. Cardiovascular: RRR, S1/S2 +, no rubs, no gallops Respiratory: CTA bilaterally, no wheezing, no rhonchi Abdominal: Soft, NT, ND, bowel sounds + Extremities: no edema, no cyanosis    The results of significant diagnostics from this hospitalization (including imaging, microbiology, ancillary and laboratory) are listed below for reference.     Microbiology: Recent Results (from the past 240 hour(s))  Resp Panel by  RT-PCR (Flu A&B, Covid) Nasopharyngeal Swab     Status: None   Collection Time: 06/21/20  7:31 PM   Specimen: Nasopharyngeal Swab; Nasopharyngeal(NP) swabs in vial transport medium  Result Value Ref Range Status   SARS Coronavirus 2 by RT PCR NEGATIVE NEGATIVE Final    Comment: (NOTE) SARS-CoV-2 target nucleic acids are NOT DETECTED.  The SARS-CoV-2 RNA is generally detectable in upper respiratory specimens during the acute phase of infection. The lowest concentration of SARS-CoV-2 viral copies this assay can detect is 138 copies/mL. A negative result does not preclude SARS-Cov-2 infection and should not be used as the sole basis for treatment or other patient management decisions. A negative result may occur with  improper specimen collection/handling, submission of specimen other than nasopharyngeal swab, presence of viral mutation(s) within the areas targeted by this assay, and inadequate number of viral copies(<138 copies/mL). A negative result must be combined with clinical observations, patient history, and epidemiological information. The expected result is Negative.  Fact Sheet for Patients:  EntrepreneurPulse.com.au  Fact Sheet for Healthcare Providers:  IncredibleEmployment.be  This test is no t yet approved or cleared by the [place] FDA and  has been authorized for detection and/or diagnosis of SARS-CoV-2 by FDA under an Emergency Use Authorization (EUA). This EUA will remain  in effect (meaning this test can be used) for the duration of the COVID-19 declaration under Section 564(b)(1) of the Act, 21 U.S.C.section 360bbb-3(b)(1), unless the authorization is terminated  or revoked sooner.       Influenza A by PCR NEGATIVE NEGATIVE Final   Influenza B by PCR NEGATIVE NEGATIVE Final    Comment: (NOTE) The Xpert Xpress SARS-CoV-2/FLU/RSV plus assay is intended as an aid in the diagnosis of influenza from Nasopharyngeal swab  specimens and should not be used as a sole basis for treatment. Nasal washings and aspirates are unacceptable for Xpert Xpress SARS-CoV-2/FLU/RSV testing.  Fact Sheet for Patients: EntrepreneurPulse.com.au  Fact Sheet for Healthcare Providers: IncredibleEmployment.be  This test is not yet approved or cleared by the [place] FDA and has been authorized for detection and/or diagnosis of SARS-CoV-2 by FDA under an Emergency Use Authorization (EUA). This EUA will remain in effect (meaning this test can be used) for the duration of the COVID-19 declaration under Section 564(b)(1) of the Act, 21 U.S.C. section 360bbb-3(b)(1), unless the authorization is terminated or revoked.  Performed at [facility], [place] [address] [place], [place] [address]      Labs: BNP (last 3 results) No results for input(s): BNP in the last 8760 hours. Basic Metabolic Panel: Recent Labs  Lab 06/23/20 0403 06/24/20 0258 06/25/20 0402  NA 135 134* 137  K 3.9 3.6 3.4*  CL 99 99 99  CO2 23 27 28   GLUCOSE 107* 125* 132*  BUN 15 11 18   CREATININE 0.68 0.56 0.66  CALCIUM 9.2 9.1 9.4   Liver Function Tests: No results for input(s): AST, ALT, ALKPHOS, BILITOT, PROT, ALBUMIN in the last 168 hours. No results for input(s): LIPASE, AMYLASE in the last 168 hours. No results for input(s): AMMONIA in the last 168 hours. CBC: Recent Labs  Lab 06/23/20 0403  WBC 9.6  HGB 10.9*  HCT 35.4*  MCV 92.7  PLT 177   Cardiac Enzymes: No results for input(s): CKTOTAL, CKMB, CKMBINDEX, TROPONINI in the last 168 hours. BNP: Invalid input(s): POCBNP CBG: No results for input(s): GLUCAP in the last 168 hours. D-Dimer No results for input(s): DDIMER in the last 72 hours. Hgb A1c No results for input(s): HGBA1C in the last 72 hours. Lipid Profile No results for input(s): CHOL, HDL, LDLCALC, TRIG, CHOLHDL, LDLDIRECT in the last 72 hours. Thyroid function studies No  results for input(s): TSH, T4TOTAL, T3FREE, THYROIDAB in the last 72 hours.  Invalid input(s): FREET3 Anemia work up No results for input(s): VITAMINB12, FOLATE, FERRITIN, TIBC, IRON, RETICCTPCT in the last 72 hours. Urinalysis    Component Value Date/Time   COLORURINE YELLOW 06/26/2016 1536   APPEARANCEUR CLOUDY (A) 06/26/2016 1536   LABSPEC 1.005 06/26/2016 1536   PHURINE 6.0 06/26/2016 1536   GLUCOSEU NEGATIVE 06/26/2016 1536   HGBUR LARGE (A) 06/26/2016 1536   BILIRUBINUR neg 07/15/2016 1649   KETONESUR NEGATIVE 06/26/2016 1536   PROTEINUR 15mg  07/15/2016 1649   PROTEINUR NEGATIVE 06/26/2016 1536   UROBILINOGEN 0.2 07/15/2016 1649   UROBILINOGEN 0.2 08/20/2011 1151   NITRITE neg 07/15/2016 1649   NITRITE POSITIVE (A) 06/26/2016 1536   LEUKOCYTESUR Negative 07/15/2016 1649   Sepsis Labs Invalid input(s): PROCALCITONIN,  WBC,  LACTICIDVEN Microbiology Recent Results (from the past 240 hour(s))  Resp Panel by RT-PCR (Flu A&B, Covid) Nasopharyngeal Swab     Status: None   Collection Time: 06/21/20  7:31 PM   Specimen: Nasopharyngeal Swab; Nasopharyngeal(NP) swabs in vial transport medium  Result Value Ref Range Status   SARS Coronavirus 2 by RT PCR NEGATIVE NEGATIVE Final    Comment: (NOTE) SARS-CoV-2 target nucleic acids are NOT DETECTED.  The SARS-CoV-2 RNA is generally detectable in upper respiratory specimens during the acute phase of infection. The lowest concentration of SARS-CoV-2 viral copies this assay can detect is 138 copies/mL. A negative result does not preclude SARS-Cov-2 infection and should not be used as the sole basis for treatment or other patient management decisions. A negative result may occur with  improper specimen collection/handling, submission of specimen other than nasopharyngeal swab, presence of viral mutation(s) within the areas targeted by this assay, and inadequate number of viral copies(<138 copies/mL). A negative result must be combined  with clinical observations, patient history, and epidemiological information. The expected result is Negative.  Fact Sheet for Patients:  EntrepreneurPulse.com.au  Fact Sheet for Healthcare Providers:  IncredibleEmployment.be  This test is no t yet approved or cleared by the [place] FDA and  has been authorized for detection and/or diagnosis of SARS-CoV-2 by FDA under an Emergency Use Authorization (EUA). This EUA will remain  in effect (meaning this test can be used) for the duration of the COVID-19 declaration under Section 564(b)(1) of the Act, 21 U.S.C.section 360bbb-3(b)(1), unless the authorization is terminated  or revoked sooner.       Influenza A by PCR NEGATIVE NEGATIVE Final   Influenza B by PCR NEGATIVE NEGATIVE Final    Comment: (NOTE) The Xpert Xpress SARS-CoV-2/FLU/RSV plus  assay is intended as an aid in the diagnosis of influenza from Nasopharyngeal swab specimens and should not be used as a sole basis for treatment. Nasal washings and aspirates are unacceptable for Xpert Xpress SARS-CoV-2/FLU/RSV testing.  Fact Sheet for Patients: EntrepreneurPulse.com.au  Fact Sheet for Healthcare Providers: IncredibleEmployment.be  This test is not yet approved or cleared by the [place] FDA and has been authorized for detection and/or diagnosis of SARS-CoV-2 by FDA under an Emergency Use Authorization (EUA). This EUA will remain in effect (meaning this test can be used) for the duration of the COVID-19 declaration under Section 564(b)(1) of the Act, 21 U.S.C. section 360bbb-3(b)(1), unless the authorization is terminated or revoked.  Performed at [facility], [place] [address], [place], [place] [address]      Time coordinating discharge:  35 minutes  SIGNED:   Barb Merino, MD  [facility] 06/29/2020, 10:57 AM

## 2020-06-29 NOTE — TOC Progression Note (Addendum)
Transition of Care [facility]) - Progression Note    Patient Details  Name: Samantha Bishop MRN: [id number] Date of Birth: 09-Mar-1925  Transition of Care [facility]) CM/SW [place], [place] Phone Number: 06/29/2020, 12:10 PM  Clinical Narrative:     Update 2/20 1:11pm CSW spoke with Melissa with [facility] and she confirmed they cannot accept patient for dc today. They cannot admit outside residents on weekend. Melissa confirmed they will be able to accept patient tomorrow for dc. CSW requested covid for patient.  Patient has SNF bed at [facility]. [facility] can accept patient tomorrow.  CSW will continue to follow.  CSW spoke with patients son [name] who chose SNF placement at [facility]. CSW called [facility] who confirmed they can accept patient for SNF placement.  CSW awaiting callback from [facility] to see if they can accept patient for dc today. CSW requested covid for patient.  CSW will continue to follow.    Expected Discharge Plan: [place] (from home alone) Barriers to Discharge: Continued Medical Work up  Expected Discharge Plan and Services Expected Discharge Plan: [place] (from home alone)   Discharge Planning Services: CM Consult   Living arrangements for the past 2 months: Single Family Home Expected Discharge Date: 06/29/20                                     Social Determinants of Health (SDOH) Interventions    Readmission Risk Interventions No flowsheet data found.

## 2020-06-29 NOTE — Progress Notes (Signed)
Pt had been medicated with oxycodone and tylenol for pain. Pt encourages to ask for pain medicine before pain starts to get worst. C-collar remains in place. Pt also had a large BM this morning.

## 2020-06-30 DIAGNOSIS — Z7401 Bed confinement status: Secondary | ICD-10-CM | POA: Diagnosis not present

## 2020-06-30 DIAGNOSIS — I10 Essential (primary) hypertension: Secondary | ICD-10-CM | POA: Diagnosis not present

## 2020-06-30 DIAGNOSIS — I4811 Longstanding persistent atrial fibrillation: Secondary | ICD-10-CM | POA: Insufficient documentation

## 2020-06-30 DIAGNOSIS — H04123 Dry eye syndrome of bilateral lacrimal glands: Secondary | ICD-10-CM | POA: Insufficient documentation

## 2020-06-30 DIAGNOSIS — R278 Other lack of coordination: Secondary | ICD-10-CM | POA: Diagnosis not present

## 2020-06-30 DIAGNOSIS — R6889 Other general symptoms and signs: Secondary | ICD-10-CM | POA: Diagnosis not present

## 2020-06-30 DIAGNOSIS — M2550 Pain in unspecified joint: Secondary | ICD-10-CM | POA: Diagnosis not present

## 2020-06-30 DIAGNOSIS — S12031D Nondisplaced posterior arch fracture of first cervical vertebra, subsequent encounter for fracture with routine healing: Secondary | ICD-10-CM | POA: Insufficient documentation

## 2020-06-30 DIAGNOSIS — G4700 Insomnia, unspecified: Secondary | ICD-10-CM | POA: Insufficient documentation

## 2020-06-30 DIAGNOSIS — Z86711 Personal history of pulmonary embolism: Secondary | ICD-10-CM | POA: Insufficient documentation

## 2020-06-30 DIAGNOSIS — H53461 Homonymous bilateral field defects, right side: Secondary | ICD-10-CM | POA: Insufficient documentation

## 2020-06-30 DIAGNOSIS — G4730 Sleep apnea, unspecified: Secondary | ICD-10-CM | POA: Insufficient documentation

## 2020-06-30 DIAGNOSIS — S12001D Unspecified nondisplaced fracture of first cervical vertebra, subsequent encounter for fracture with routine healing: Secondary | ICD-10-CM | POA: Diagnosis not present

## 2020-06-30 DIAGNOSIS — W19XXXA Unspecified fall, initial encounter: Secondary | ICD-10-CM | POA: Diagnosis not present

## 2020-06-30 DIAGNOSIS — Z743 Need for continuous supervision: Secondary | ICD-10-CM | POA: Diagnosis not present

## 2020-06-30 DIAGNOSIS — Z8673 Personal history of transient ischemic attack (TIA), and cerebral infarction without residual deficits: Secondary | ICD-10-CM | POA: Diagnosis not present

## 2020-06-30 DIAGNOSIS — R2689 Other abnormalities of gait and mobility: Secondary | ICD-10-CM | POA: Diagnosis not present

## 2020-06-30 DIAGNOSIS — S12000A Unspecified displaced fracture of first cervical vertebra, initial encounter for closed fracture: Secondary | ICD-10-CM | POA: Diagnosis not present

## 2020-06-30 DIAGNOSIS — Z9181 History of falling: Secondary | ICD-10-CM | POA: Insufficient documentation

## 2020-06-30 DIAGNOSIS — I69398 Other sequelae of cerebral infarction: Secondary | ICD-10-CM | POA: Insufficient documentation

## 2020-06-30 DIAGNOSIS — M6281 Muscle weakness (generalized): Secondary | ICD-10-CM | POA: Insufficient documentation

## 2020-06-30 DIAGNOSIS — W19XXXD Unspecified fall, subsequent encounter: Secondary | ICD-10-CM | POA: Diagnosis not present

## 2020-06-30 DIAGNOSIS — I482 Chronic atrial fibrillation, unspecified: Secondary | ICD-10-CM | POA: Diagnosis not present

## 2020-06-30 DIAGNOSIS — K5790 Diverticulosis of intestine, part unspecified, without perforation or abscess without bleeding: Secondary | ICD-10-CM | POA: Insufficient documentation

## 2020-06-30 DIAGNOSIS — M8580 Other specified disorders of bone density and structure, unspecified site: Secondary | ICD-10-CM | POA: Insufficient documentation

## 2020-06-30 DIAGNOSIS — Z7901 Long term (current) use of anticoagulants: Secondary | ICD-10-CM | POA: Insufficient documentation

## 2020-06-30 DIAGNOSIS — S12100D Unspecified displaced fracture of second cervical vertebra, subsequent encounter for fracture with routine healing: Secondary | ICD-10-CM | POA: Diagnosis not present

## 2020-06-30 DIAGNOSIS — S12112D Nondisplaced Type II dens fracture, subsequent encounter for fracture with routine healing: Secondary | ICD-10-CM | POA: Diagnosis not present

## 2020-06-30 MED ORDER — POLYETHYLENE GLYCOL 3350 17 G PO PACK: 17.0000 g | PACK | Freq: Every day | ORAL | Status: DC | PRN

## 2020-06-30 NOTE — Progress Notes (Signed)
PROGRESS NOTE    Samantha Bishop  EHM:[id number] DOB: Mar 02, 1925 DOA: 06/21/2020 PCP: Hoyt Koch, MD    Brief Narrative:  87 year old female from home with history of hypertension, dyslipidemia, chronic A. fib on Eliquis, history of PE presented from home on 2/12 due to fall.  She was raking leaves and tripped on a brick.  In the emergency room, a skeletal survey including CT scan of the head and neck, cervical spine done that showed nondisplaced fracture of the base of the dens and C1. She stayed in the hospital with persistent pain and waiting for a SNF bed.   Assessment & Plan:   Principal Problem:   Cervical spine fracture [facility]) Active Problems:   Essential hypertension   Atrial fibrillation - followed by Dr. Caryl Comes   CVA (cerebral vascular accident) [facility])   Intractable pain   Hypokalemia   Fall from ground level   Closed cervical spine fracture ([place])  Close traumatic cervical spine fracture: Fracture of the base of the dens, acute nondisplaced fracture of the anterior arch of C1. Seen by neurosurgery, recommended conservative management.  Aspen collar all the time.  Neurosurgery to schedule follow-up in 2 weeks. with significant pain and discomfort with cervical collar. Use adequate pain medications along with laxatives and continue to work with PT OT. Repeated CT scans with no further complications. She had severe constipation, we used aggressive bowel regimen and now with diarrhea.  Start further bowel regimen.  Hypertension: Blood pressures currently stable on hydrochlorothiazide and Norvasc.  Chronic A. fib: Rate controlled.  Not on any rate control medications.  Therapeutic on Eliquis.  History of a stroke: No evidence of new stroke.  Remains on statin and Eliquis.  Patient is medically stable to transfer to skilled level of care today.   DVT prophylaxis: SCDs Start: 06/21/20 2037 apixaban (ELIQUIS) tablet 5 mg   Code Status: Full code Family  Communication: None today. Disposition Plan: Status is: Inpatient  Remains inpatient appropriate because:Inpatient level of care appropriate due to severity of illness   Dispo: The patient is from: Home              Anticipated d/c is to: SNF              Anticipated d/c date is: Today.              Patient currently is currently stable to transfer.   Difficult to place patient No   Consultants:   Neurosurgery  Procedures:   None  Antimicrobials:   None   Subjective: Patient seen and examined.  No overnight events.  Multiple loose bowel movements after repeated magnesium citrate.  Looking forward to go to [facility]. Objective: Vitals:   06/29/20 1454 06/29/20 2009 06/30/20 0300 06/30/20 0814  BP: 120/62 (!) 153/69 136/73 132/67  Pulse: 82 82 77 80  Resp: 16 17 17 16   Temp: 98.2 F (36.8 C) 98.2 F (36.8 C) 98 F (36.7 C) 98 F (36.7 C)  TempSrc: Oral Oral Oral Oral  SpO2: 96% 95% 96% 99%  Weight:      Height:        Intake/Output Summary (Last 24 hours) at 06/30/2020 1038 Last data filed at 06/30/2020 0500 Gross per 24 hour  Intake 360 ml  Output 500 ml  Net -140 ml   Filed Weights   06/21/20 1904 06/21/20 1909  Weight: 81.6 kg 81.6 kg    Examination:  General: Anxious, in mild distress.  Anxious. Cardiovascular:  S1-S2 present. Respiratory: Bilateral clear Gastrointestinal: Soft nontender. Ext: No neuro deficits.  Equal motor and sensory power in all 4 extremities.  IV lines: None Catheters: None     Data Reviewed: I have personally reviewed following labs and imaging studies  CBC: No results for input(s): WBC, NEUTROABS, HGB, HCT, MCV, PLT in the last 168 hours. Basic Metabolic Panel: Recent Labs  Lab 06/24/20 0258 06/25/20 0402  NA 134* 137  K 3.6 3.4*  CL 99 99  CO2 27 28  GLUCOSE 125* 132*  BUN 11 18  CREATININE 0.56 0.66  CALCIUM 9.1 9.4   GFR: Estimated Creatinine Clearance: 47.3 mL/min (by C-G formula based on SCr of  0.66 mg/dL). Liver Function Tests: No results for input(s): AST, ALT, ALKPHOS, BILITOT, PROT, ALBUMIN in the last 168 hours. No results for input(s): LIPASE, AMYLASE in the last 168 hours. No results for input(s): AMMONIA in the last 168 hours. Coagulation Profile: No results for input(s): INR, PROTIME in the last 168 hours. Cardiac Enzymes: No results for input(s): CKTOTAL, CKMB, CKMBINDEX, TROPONINI in the last 168 hours. BNP (last 3 results) No results for input(s): PROBNP in the last 8760 hours. HbA1C: No results for input(s): HGBA1C in the last 72 hours. CBG: No results for input(s): GLUCAP in the last 168 hours. Lipid Profile: No results for input(s): CHOL, HDL, LDLCALC, TRIG, CHOLHDL, LDLDIRECT in the last 72 hours. Thyroid Function Tests: No results for input(s): TSH, T4TOTAL, FREET4, T3FREE, THYROIDAB in the last 72 hours. Anemia Panel: No results for input(s): VITAMINB12, FOLATE, FERRITIN, TIBC, IRON, RETICCTPCT in the last 72 hours. Sepsis Labs: No results for input(s): PROCALCITON, LATICACIDVEN in the last 168 hours.  Recent Results (from the past 240 hour(s))  Resp Panel by RT-PCR (Flu A&B, Covid) Nasopharyngeal Swab     Status: None   Collection Time: 06/21/20  7:31 PM   Specimen: Nasopharyngeal Swab; Nasopharyngeal(NP) swabs in vial transport medium  Result Value Ref Range Status   SARS Coronavirus 2 by RT PCR NEGATIVE NEGATIVE Final    Comment: (NOTE) SARS-CoV-2 target nucleic acids are NOT DETECTED.  The SARS-CoV-2 RNA is generally detectable in upper respiratory specimens during the acute phase of infection. The lowest concentration of SARS-CoV-2 viral copies this assay can detect is 138 copies/mL. A negative result does not preclude SARS-Cov-2 infection and should not be used as the sole basis for treatment or other patient management decisions. A negative result may occur with  improper specimen collection/handling, submission of specimen other than  nasopharyngeal swab, presence of viral mutation(s) within the areas targeted by this assay, and inadequate number of viral copies(<138 copies/mL). A negative result must be combined with clinical observations, patient history, and epidemiological information. The expected result is Negative.  Fact Sheet for Patients:  EntrepreneurPulse.com.au  Fact Sheet for Healthcare Providers:  IncredibleEmployment.be  This test is no t yet approved or cleared by the [place] FDA and  has been authorized for detection and/or diagnosis of SARS-CoV-2 by FDA under an Emergency Use Authorization (EUA). This EUA will remain  in effect (meaning this test can be used) for the duration of the COVID-19 declaration under Section 564(b)(1) of the Act, 21 U.S.C.section 360bbb-3(b)(1), unless the authorization is terminated  or revoked sooner.       Influenza A by PCR NEGATIVE NEGATIVE Final   Influenza B by PCR NEGATIVE NEGATIVE Final    Comment: (NOTE) The Xpert Xpress SARS-CoV-2/FLU/RSV plus assay is intended as an aid in the diagnosis of influenza  from Nasopharyngeal swab specimens and should not be used as a sole basis for treatment. Nasal washings and aspirates are unacceptable for Xpert Xpress SARS-CoV-2/FLU/RSV testing.  Fact Sheet for Patients: EntrepreneurPulse.com.au  Fact Sheet for Healthcare Providers: IncredibleEmployment.be  This test is not yet approved or cleared by the [place] FDA and has been authorized for detection and/or diagnosis of SARS-CoV-2 by FDA under an Emergency Use Authorization (EUA). This EUA will remain in effect (meaning this test can be used) for the duration of the COVID-19 declaration under Section 564(b)(1) of the Act, 21 U.S.C. section 360bbb-3(b)(1), unless the authorization is terminated or revoked.  Performed at [facility], [place] [address] [place], [place] [address]    SARS CORONAVIRUS 2 (TAT 6-24 HRS) Nasopharyngeal Nasopharyngeal Swab     Status: None   Collection Time: 06/29/20 11:32 AM   Specimen: Nasopharyngeal Swab  Result Value Ref Range Status   SARS Coronavirus 2 NEGATIVE NEGATIVE Final    Comment: (NOTE) SARS-CoV-2 target nucleic acids are NOT DETECTED.  The SARS-CoV-2 RNA is generally detectable in upper and lower respiratory specimens during the acute phase of infection. Negative results do not preclude SARS-CoV-2 infection, do not rule out co-infections with other pathogens, and should not be used as the sole basis for treatment or other patient management decisions. Negative results must be combined with clinical observations, patient history, and epidemiological information. The expected result is Negative.  Fact Sheet for Patients: SugarRoll.be  Fact Sheet for Healthcare Providers: https://www.woods-mathews.com/  This test is not yet approved or cleared by the [place] FDA and  has been authorized for detection and/or diagnosis of SARS-CoV-2 by FDA under an Emergency Use Authorization (EUA). This EUA will remain  in effect (meaning this test can be used) for the duration of the COVID-19 declaration under Se ction 564(b)(1) of the Act, 21 U.S.C. section 360bbb-3(b)(1), unless the authorization is terminated or revoked sooner.  Performed at [facility], [place] [address], [place], [place] [address]          Radiology Studies: No results found.      Scheduled Meds: . amLODipine  5 mg Oral Daily  . apixaban  5 mg Oral BID  . cholecalciferol  2,000 Units Oral Daily  . fluticasone  2 spray Each Nare Daily  . gabapentin  600 mg Oral TID  . hydrochlorothiazide  12.5 mg Oral Daily  . pravastatin  20 mg Oral q1800  . sodium chloride flush  3 mL Intravenous Q12H   Continuous Infusions: . lactated ringers 10 mL/hr at 06/23/20 1305  . methocarbamol (ROBAXIN) IV        LOS: 8 days    Time spent: 30 minutes    Barb Merino, MD [facility] Pager [phone]

## 2020-06-30 NOTE — Progress Notes (Signed)
Report given to [place] at [facility]. All questions answered. Pt belongings gathered to be sent with her. Pt waiting on [facility] for transport.

## 2020-06-30 NOTE — TOC Transition Note (Addendum)
Transition of Care [facility]) - CM/SW Discharge Note   Patient Details  Name: Samantha Bishop MRN: [id number] Date of Birth: May 08, 1925  Transition of [facility]) CM/SW Contact:  Gabrielle Dare Phone Number: 06/30/2020, 1:40 PM   Clinical Narrative:    Patient will Discharge To: [facility] Anticipated DC Date: 06/30/20 Family Notified:yes, son, Samantha Bishop, [phone] Transport [id number]   Per MD patient ready for DC to [facility] . RN, patient, patient's family, and facility notified of DC. Assessment, Fl2/Pasrr, and Discharge Summary sent to facility. RN given number for report [phone], Room# 304 on [facility] [place]). DC packet on chart. Ambulance transport requested for patient.   CSW signing off.  Reed Breech [facility] [phone]     Final next level of care: Skilled Nursing Facility Barriers to Discharge: No Barriers Identified   Patient Goals and CMS Choice   CMS Medicare.gov Compare Post Acute Care list provided to:: Patient    Discharge Placement              Patient chooses bed at: [facility] Patient to be transferred to facility by: [place] Name of family member notified: Samantha Bishop, son Patient and family notified of of transfer: 06/30/20  Discharge Plan and Services   Discharge Planning Services: CM Consult                                 Social Determinants of Health (SDOH) Interventions     Readmission Risk Interventions No flowsheet data found.

## 2020-06-30 NOTE — Discharge Summary (Signed)
Physician Discharge Summary  Samantha Bishop:[id number] DOB: 1924/10/10 DOA: 06/21/2020  PCP: Hoyt Koch, MD  Admit date: 06/21/2020 Discharge date: 06/30/2020  Admitted From: Home Disposition: Skilled nursing facility  Recommendations for Outpatient Follow-up:  1. Follow up with PCP in 1-2 weeks 2. Schedule follow-up with neurosurgery in 2 weeks.  Home Health: Not applicable Equipment/Devices: Not applicable  Discharge Condition: Stable CODE STATUS: Full code Diet recommendation: Low-salt diet  Discharge summary:  87 year old female from home with history of hypertension, dyslipidemia, chronic A. fib on Eliquis, history of PE presented from home on 2/12 due to fall.  She was raking leaves and tripped on a brick.  In the emergency room, a skeletal survey including CT scan of the head and neck, cervical spine done that showed nondisplaced fracture of the base of the dens and C1. She stayed in the hospital with persistent pain and waiting for a SNF bed.   Assessment & Plan of care:  # Close traumatic cervical spine fracture: Fracture of the base of the dens, acute nondisplaced fracture of the anterior arch of C1. Seen by neurosurgery, recommended conservative management.  Aspen collar all the time.  Neurosurgery to schedule follow-up in 2 weeks. with significant pain and discomfort with cervical collar. Use adequate pain medications along with laxatives and continue to work with PT OT. Repeated CT scans with no further complications. Patient developed significant constipation and needed aggressive laxatives and suppository with relief and resulting diarrhea. Use as needed miralax. Pain is well controlled on oral oxycodone as well as occasional dose of Ativan now.  # Hypertension: Blood pressures currently stable on hydrochlorothiazide and Norvasc.  # Chronic A. fib: Rate controlled.  Not on any rate control medications.  Therapeutic on Eliquis.  # History of a  stroke: No evidence of new stroke.  Remains on statin and Eliquis.  Patient is medically stable to transfer to skilled level of care.    Discharge Diagnoses:  Principal Problem:   Cervical spine fracture (HCC) Active Problems:   Essential hypertension   Atrial fibrillation - followed by Dr. Caryl Comes   CVA (cerebral vascular accident) [facility])   Intractable pain   Hypokalemia   Fall from ground level   Closed cervical spine fracture [facility])    Discharge Instructions  Discharge Instructions    Call MD for:  difficulty breathing, headache or visual disturbances   Complete by: As directed    Call MD for:  severe uncontrolled pain   Complete by: As directed    Diet - low sodium heart healthy   Complete by: As directed    Discharge instructions   Complete by: As directed    She will keep cervical collar all the time until seen by neurosurgery on follow-up.   Increase activity slowly   Complete by: As directed    Increase activity slowly   Complete by: As directed    Other Restrictions   Complete by: As directed    To wear collar all the time     Allergies as of 06/30/2020      Reactions   Codeine Nausea Only   Penicillins Rash      Medication List    TAKE these medications   apixaban 5 MG Tabs tablet Commonly known as: Eliquis TAKE 1 TABLET (5 MG TOTAL) BY MOUTH 2 (TWO) TIMES DAILY. What changed:   how much to take  how to take this  when to take this  additional instructions   fluticasone 50  MCG/ACT nasal spray Commonly known as: FLONASE Place 2 sprays into both nostrils daily. What changed: when to take this   gabapentin 300 MG capsule Commonly known as: NEURONTIN Take 2 capsules (600 mg total) by mouth 3 (three) times daily.   hydrochlorothiazide 25 MG tablet Commonly known as: HYDRODIURIL Take 0.5 tablets (12.5 mg total) by mouth daily.   LORazepam 0.5 MG tablet Commonly known as: Ativan Take 1 tablet (0.5 mg total) by mouth every 8 (eight) hours as  needed for up to 5 days for anxiety or sleep.   multivitamin with minerals Tabs tablet Take 1 tablet by mouth daily.   NON FORMULARY Diet order: Regular Diet.   oxyCODONE 5 MG immediate release tablet Commonly known as: Oxy IR/ROXICODONE Take 1 tablet (5 mg total) by mouth every 4 (four) hours as needed for up to 5 days for moderate pain or severe pain.   polyethylene glycol 17 g packet Commonly known as: MIRALAX / GLYCOLAX Take 17 g by mouth daily as needed for moderate constipation.   pravastatin 20 MG tablet Commonly known as: PRAVACHOL Take 1 tablet (20 mg total) by mouth daily at 6 PM.   sodium chloride 0.65 % Soln nasal spray Commonly known as: OCEAN Place 1 spray into both nostrils as needed for congestion. What changed: when to take this   Vitamin D3 25 MCG tablet Commonly known as: Vitamin D Take 2 tablets (2,000 Units total) by mouth daily.       Contact information for follow-up providers    Hoyt Koch, MD Follow up in 2 week(s).   Specialty: Internal Medicine Contact information: [place] [address] [phone]        Consuella Lose, MD. Schedule an appointment as soon as possible for a visit in 2 week(s).   Specialty: Neurosurgery Contact information: [address] [place] [address] [place] [address] [phone]            Contact information for after-discharge care    Destination    [facility] .   Service: Skilled Nursing Contact information: [place] [address] [phone]                 Allergies  Allergen Reactions  . Codeine Nausea Only  . Penicillins Rash    Consultations:  Neurosurgery   Procedures/Studies: CT HEAD WO CONTRAST  Result Date: 06/26/2020 CLINICAL DATA:  Head trauma, moderate/severe, severe persistent headache, trauma on Eliquis. EXAM: CT HEAD WITHOUT CONTRAST TECHNIQUE: Contiguous axial images were  obtained from the base of the skull through the vertex without intravenous contrast. COMPARISON:  Prior head CT examinations 06/22/2020 and earlier. FINDINGS: Brain: Mild cerebral atrophy. Redemonstrated chronic cortical/subcortical infarcts within the right parietal and left occipital lobes. Redemonstrated chronic infarct within the medial left cerebellum. Stable background cerebral white matter chronic small vessel ischemic disease. There is no acute intracranial hemorrhage. No acute demarcated cortical infarct. No extra-axial fluid collection. No evidence of intracranial mass. No midline shift. Vascular: No hyperdense vessel.  Atherosclerotic calcifications. Skull: Normal. Negative for fracture or focal lesion. Sinuses/Orbits: Visualized orbits show no acute finding. Left maxillary sinus mucous retention cyst. Trace bilateral ethmoid sinus mucosal thickening. Other: Known acute fractures of the dens and C1 anterior arch, incompletely imaged. IMPRESSION: No evidence of acute intracranial abnormality. Unchanged generalized atrophy of the brain, chronic small vessel ischemic disease and chronic infarcts as described. Known acute fractures of the dens and C1 anterior  arch, incompletely imaged on the current exam. Trace ethmoid sinus mucosal thickening. Left maxillary sinus mucous retention cyst. Electronically Signed   By: Kellie Simmering DO   On: 06/26/2020 11:30   CT HEAD WO CONTRAST  Result Date: 06/22/2020 CLINICAL DATA:  Poly trauma, critical, head/cervical spine injury suspected. Severe headache, fall. EXAM: CT HEAD WITHOUT CONTRAST TECHNIQUE: Contiguous axial images were obtained from the base of the skull through the vertex without intravenous contrast. COMPARISON:  Noncontrast head CT 06/21/2020 FINDINGS: Brain: Mild cerebral atrophy. Redemonstrated chronic cortical/subcortical infarcts within the right parietal lobe and left occipital lobes. Redemonstrated small chronic infarct within the left cerebellar  hemisphere. Patchy and ill-defined hypoattenuation within the cerebral white matter is nonspecific, but compatible with chronic small vessel ischemic disease. There is no acute intracranial hemorrhage. No acute demarcated cortical infarct. No extra-axial fluid collection. No evidence of intracranial mass. No midline shift. Vascular: No hyperdense vessel.  Atherosclerotic calcifications. Skull: Normal. Negative for fracture or focal lesion. Sinuses/Orbits: Visualized orbits show no acute finding. Mild scattered paranasal sinus mucosal thickening. Left maxillary sinus mucous retention cyst. Other: Known acute type 2 dens fracture incompletely assessed. Known acute fracture of the C1 anterior arch. IMPRESSION: No evidence of acute intracranial abnormality. Redemonstrated generalized atrophy, chronic small vessel ischemic disease and chronic infarcts as described. Known acute type 2 dens fracture and acute fracture of the C1 anterior arch. Paranasal sinus disease as described. Electronically Signed   By: Kellie Simmering DO   On: 06/22/2020 12:09   CT Head Wo Contrast  Result Date: 06/21/2020 CLINICAL DATA:  Fall with head trauma.  Anticoagulated. EXAM: CT HEAD WITHOUT CONTRAST CT CERVICAL SPINE WITHOUT CONTRAST TECHNIQUE: Multidetector CT imaging of the head and cervical spine was performed following the standard protocol without intravenous contrast. Multiplanar CT image reconstructions of the cervical spine were also generated. COMPARISON:  Head CT 01/04/2019 FINDINGS: CT HEAD FINDINGS Brain: No abnormality affects the brainstem. Old inferior cerebellar stroke on the left. Old left PCA territory infarction affecting the left occipital lobe. Old right parietal cortical and subcortical infarction. Chronic small-vessel ischemic changes diffusely throughout the hemispheric white matter. No sign of acute infarction, mass lesion, hemorrhage, hydrocephalus or extra-axial collection. Vascular: There is atherosclerotic  calcification of the major vessels at the base of the brain. Skull: Negative.  No skull fracture. Sinuses/Orbits: Clear/normal Other: None CT CERVICAL SPINE FINDINGS Alignment: Normal Skull base and vertebrae: Acute nondisplaced fracture at the base of the dens, type 2. Acute nondisplaced fracture of the anterior arch of C1. No other regional injury. Soft tissues and spinal canal: No soft tissue finding. Disc levels:  Chronic arthritis at the C1-2 articulation. C2-3: Chronic facet fusion.  No compressive stenosis. C3-4: Facet osteoarthritis. Bulging of the disc. Foraminal stenosis left worse than right. C4-5: Chronic fusion.  No stenosis. C5-6: Facet osteoarthritis on the left. Mild left bony foraminal stenosis. C6-7: Degenerative spondylosis with endplate osteophytes. Facet arthritis on the left. Mild left foraminal narrowing. C7-T1: Chronic facet arthritis.  No significant stenosis. Upper chest: Negative Other: None IMPRESSION: HEAD CT: No acute or traumatic finding. Old infarctions as outlined above. CERVICAL SPINE CT: 1. Acute nondisplaced fracture at the base of the dens, type 2. Acute nondisplaced fracture of the anterior arch of C1. No other regional injury. 2. Chronic degenerative changes as outlined above. 3. These results were called by telephone at the time of interpretation on 06/21/2020 at 7:44 pm to provider [facility] , who verbally acknowledged these results. Electronically Signed  By: Nelson Chimes M.D.   On: 06/21/2020 19:44   CT Cervical Spine Wo Contrast  Result Date: 06/21/2020 CLINICAL DATA:  Fall with head trauma.  Anticoagulated. EXAM: CT HEAD WITHOUT CONTRAST CT CERVICAL SPINE WITHOUT CONTRAST TECHNIQUE: Multidetector CT imaging of the head and cervical spine was performed following the standard protocol without intravenous contrast. Multiplanar CT image reconstructions of the cervical spine were also generated. COMPARISON:  Head CT 01/04/2019 FINDINGS: CT HEAD FINDINGS Brain: No  abnormality affects the brainstem. Old inferior cerebellar stroke on the left. Old left PCA territory infarction affecting the left occipital lobe. Old right parietal cortical and subcortical infarction. Chronic small-vessel ischemic changes diffusely throughout the hemispheric white matter. No sign of acute infarction, mass lesion, hemorrhage, hydrocephalus or extra-axial collection. Vascular: There is atherosclerotic calcification of the major vessels at the base of the brain. Skull: Negative.  No skull fracture. Sinuses/Orbits: Clear/normal Other: None CT CERVICAL SPINE FINDINGS Alignment: Normal Skull base and vertebrae: Acute nondisplaced fracture at the base of the dens, type 2. Acute nondisplaced fracture of the anterior arch of C1. No other regional injury. Soft tissues and spinal canal: No soft tissue finding. Disc levels:  Chronic arthritis at the C1-2 articulation. C2-3: Chronic facet fusion.  No compressive stenosis. C3-4: Facet osteoarthritis. Bulging of the disc. Foraminal stenosis left worse than right. C4-5: Chronic fusion.  No stenosis. C5-6: Facet osteoarthritis on the left. Mild left bony foraminal stenosis. C6-7: Degenerative spondylosis with endplate osteophytes. Facet arthritis on the left. Mild left foraminal narrowing. C7-T1: Chronic facet arthritis.  No significant stenosis. Upper chest: Negative Other: None IMPRESSION: HEAD CT: No acute or traumatic finding. Old infarctions as outlined above. CERVICAL SPINE CT: 1. Acute nondisplaced fracture at the base of the dens, type 2. Acute nondisplaced fracture of the anterior arch of C1. No other regional injury. 2. Chronic degenerative changes as outlined above. 3. These results were called by telephone at the time of interpretation on 06/21/2020 at 7:44 pm to provider [facility] , who verbally acknowledged these results. Electronically Signed   By: Nelson Chimes M.D.   On: 06/21/2020 19:44   DG Chest Portable 1 View  Result Date:  06/21/2020 CLINICAL DATA:  Fall at home. EXAM: PORTABLE CHEST 1 VIEW COMPARISON:  Radiograph 06/26/2016 FINDINGS: Chronic cardiomegaly, not significantly changed. Stable mediastinal contours with aortic atherosclerosis. No pneumothorax or large pleural effusion. No visualized rib fracture. No focal airspace disease. Mild interstitial coarsening which is chronic. The bones are under mineralized. IMPRESSION: 1. No evidence of acute traumatic injury to the thorax. 2. Stable cardiomegaly. Electronically Signed   By: Keith Rake M.D.   On: 06/21/2020 19:58   (Echo, Carotid, EGD, Colonoscopy, ERCP)    Subjective: Patient was seen and examined.  Things are fairly controlled.  Today she is disappointed because of multiple loose bowel movement induced by laxatives. Pain is controlled.   Discharge Exam: Vitals:   06/30/20 0300 06/30/20 0814  BP: 136/73 132/67  Pulse: 77 80  Resp: 17 16  Temp: 98 F (36.7 C) 98 F (36.7 C)  SpO2: 96% 99%   Vitals:   06/29/20 1454 06/29/20 2009 06/30/20 0300 06/30/20 0814  BP: 120/62 (!) 153/69 136/73 132/67  Pulse: 82 82 77 80  Resp: 16 17 17 16   Temp: 98.2 F (36.8 C) 98.2 F (36.8 C) 98 F (36.7 C) 98 F (36.7 C)  TempSrc: Oral Oral Oral Oral  SpO2: 96% 95% 96% 99%  Weight:  Height:        General: Pt is alert, awake, not in acute distress Sitting upright with cervical collar on. Slightly anxious. Cardiovascular: RRR, S1/S2 +, no rubs, no gallops Respiratory: CTA bilaterally, no wheezing, no rhonchi Abdominal: Soft, NT, ND, bowel sounds + Extremities: no edema, no cyanosis    The results of significant diagnostics from this hospitalization (including imaging, microbiology, ancillary and laboratory) are listed below for reference.     Microbiology: Recent Results (from the past 240 hour(s))  Resp Panel by RT-PCR (Flu A&B, Covid) Nasopharyngeal Swab     Status: None   Collection Time: 06/21/20  7:31 PM   Specimen: Nasopharyngeal  Swab; Nasopharyngeal(NP) swabs in vial transport medium  Result Value Ref Range Status   SARS Coronavirus 2 by RT PCR NEGATIVE NEGATIVE Final    Comment: (NOTE) SARS-CoV-2 target nucleic acids are NOT DETECTED.  The SARS-CoV-2 RNA is generally detectable in upper respiratory specimens during the acute phase of infection. The lowest concentration of SARS-CoV-2 viral copies this assay can detect is 138 copies/mL. A negative result does not preclude SARS-Cov-2 infection and should not be used as the sole basis for treatment or other patient management decisions. A negative result may occur with  improper specimen collection/handling, submission of specimen other than nasopharyngeal swab, presence of viral mutation(s) within the areas targeted by this assay, and inadequate number of viral copies(<138 copies/mL). A negative result must be combined with clinical observations, patient history, and epidemiological information. The expected result is Negative.  Fact Sheet for Patients:  EntrepreneurPulse.com.au  Fact Sheet for Healthcare Providers:  IncredibleEmployment.be  This test is no t yet approved or cleared by the [place] FDA and  has been authorized for detection and/or diagnosis of SARS-CoV-2 by FDA under an Emergency Use Authorization (EUA). This EUA will remain  in effect (meaning this test can be used) for the duration of the COVID-19 declaration under Section 564(b)(1) of the Act, 21 U.S.C.section 360bbb-3(b)(1), unless the authorization is terminated  or revoked sooner.       Influenza A by PCR NEGATIVE NEGATIVE Final   Influenza B by PCR NEGATIVE NEGATIVE Final    Comment: (NOTE) The Xpert Xpress SARS-CoV-2/FLU/RSV plus assay is intended as an aid in the diagnosis of influenza from Nasopharyngeal swab specimens and should not be used as a sole basis for treatment. Nasal washings and aspirates are unacceptable for Xpert Xpress  SARS-CoV-2/FLU/RSV testing.  Fact Sheet for Patients: EntrepreneurPulse.com.au  Fact Sheet for Healthcare Providers: IncredibleEmployment.be  This test is not yet approved or cleared by the [place] FDA and has been authorized for detection and/or diagnosis of SARS-CoV-2 by FDA under an Emergency Use Authorization (EUA). This EUA will remain in effect (meaning this test can be used) for the duration of the COVID-19 declaration under Section 564(b)(1) of the Act, 21 U.S.C. section 360bbb-3(b)(1), unless the authorization is terminated or revoked.  Performed at [facility], [place] [address] [place], [place] [address]   SARS CORONAVIRUS 2 (TAT 6-24 HRS) Nasopharyngeal Nasopharyngeal Swab     Status: None   Collection Time: 06/29/20 11:32 AM   Specimen: Nasopharyngeal Swab  Result Value Ref Range Status   SARS Coronavirus 2 NEGATIVE NEGATIVE Final    Comment: (NOTE) SARS-CoV-2 target nucleic acids are NOT DETECTED.  The SARS-CoV-2 RNA is generally detectable in upper and lower respiratory specimens during the acute phase of infection. Negative results do not preclude SARS-CoV-2 infection, do not rule out co-infections with other  pathogens, and should not be used as the sole basis for treatment or other patient management decisions. Negative results must be combined with clinical observations, patient history, and epidemiological information. The expected result is Negative.  Fact Sheet for Patients: SugarRoll.be  Fact Sheet for Healthcare Providers: https://www.woods-mathews.com/  This test is not yet approved or cleared by the [place] FDA and  has been authorized for detection and/or diagnosis of SARS-CoV-2 by FDA under an Emergency Use Authorization (EUA). This EUA will remain  in effect (meaning this test can be used) for the duration of the COVID-19 declaration under Se ction  564(b)(1) of the Act, 21 U.S.C. section 360bbb-3(b)(1), unless the authorization is terminated or revoked sooner.  Performed at [facility], [place] [address] [place], [place] [address]      Labs: BNP (last 3 results) No results for input(s): BNP in the last 8760 hours. Basic Metabolic Panel: Recent Labs  Lab 06/24/20 0258 06/25/20 0402  NA 134* 137  K 3.6 3.4*  CL 99 99  CO2 27 28  GLUCOSE 125* 132*  BUN 11 18  CREATININE 0.56 0.66  CALCIUM 9.1 9.4   Liver Function Tests: No results for input(s): AST, ALT, ALKPHOS, BILITOT, PROT, ALBUMIN in the last 168 hours. No results for input(s): LIPASE, AMYLASE in the last 168 hours. No results for input(s): AMMONIA in the last 168 hours. CBC: No results for input(s): WBC, NEUTROABS, HGB, HCT, MCV, PLT in the last 168 hours. Cardiac Enzymes: No results for input(s): CKTOTAL, CKMB, CKMBINDEX, TROPONINI in the last 168 hours. BNP: Invalid input(s): POCBNP CBG: No results for input(s): GLUCAP in the last 168 hours. D-Dimer No results for input(s): DDIMER in the last 72 hours. Hgb A1c No results for input(s): HGBA1C in the last 72 hours. Lipid Profile No results for input(s): CHOL, HDL, LDLCALC, TRIG, CHOLHDL, LDLDIRECT in the last 72 hours. Thyroid function studies No results for input(s): TSH, T4TOTAL, T3FREE, THYROIDAB in the last 72 hours.  Invalid input(s): FREET3 Anemia work up No results for input(s): VITAMINB12, FOLATE, FERRITIN, TIBC, IRON, RETICCTPCT in the last 72 hours. Urinalysis    Component Value Date/Time   COLORURINE YELLOW 06/26/2016 1536   APPEARANCEUR CLOUDY (A) 06/26/2016 1536   LABSPEC 1.005 06/26/2016 1536   PHURINE 6.0 06/26/2016 1536   GLUCOSEU NEGATIVE 06/26/2016 1536   HGBUR LARGE (A) 06/26/2016 1536   BILIRUBINUR neg 07/15/2016 1649   KETONESUR NEGATIVE 06/26/2016 1536   PROTEINUR 15mg  07/15/2016 1649   PROTEINUR NEGATIVE 06/26/2016 1536   UROBILINOGEN 0.2 07/15/2016 1649    UROBILINOGEN 0.2 08/20/2011 1151   NITRITE neg 07/15/2016 1649   NITRITE POSITIVE (A) 06/26/2016 1536   LEUKOCYTESUR Negative 07/15/2016 1649   Sepsis Labs Invalid input(s): PROCALCITONIN,  WBC,  LACTICIDVEN Microbiology Recent Results (from the past 240 hour(s))  Resp Panel by RT-PCR (Flu A&B, Covid) Nasopharyngeal Swab     Status: None   Collection Time: 06/21/20  7:31 PM   Specimen: Nasopharyngeal Swab; Nasopharyngeal(NP) swabs in vial transport medium  Result Value Ref Range Status   SARS Coronavirus 2 by RT PCR NEGATIVE NEGATIVE Final    Comment: (NOTE) SARS-CoV-2 target nucleic acids are NOT DETECTED.  The SARS-CoV-2 RNA is generally detectable in upper respiratory specimens during the acute phase of infection. The lowest concentration of SARS-CoV-2 viral copies this assay can detect is 138 copies/mL. A negative result does not preclude SARS-Cov-2 infection and should not be used as the sole basis for treatment or other patient  management decisions. A negative result may occur with  improper specimen collection/handling, submission of specimen other than nasopharyngeal swab, presence of viral mutation(s) within the areas targeted by this assay, and inadequate number of viral copies(<138 copies/mL). A negative result must be combined with clinical observations, patient history, and epidemiological information. The expected result is Negative.  Fact Sheet for Patients:  EntrepreneurPulse.com.au  Fact Sheet for Healthcare Providers:  IncredibleEmployment.be  This test is no t yet approved or cleared by the [place] FDA and  has been authorized for detection and/or diagnosis of SARS-CoV-2 by FDA under an Emergency Use Authorization (EUA). This EUA will remain  in effect (meaning this test can be used) for the duration of the COVID-19 declaration under Section 564(b)(1) of the Act, 21 U.S.C.section 360bbb-3(b)(1), unless the  authorization is terminated  or revoked sooner.       Influenza A by PCR NEGATIVE NEGATIVE Final   Influenza B by PCR NEGATIVE NEGATIVE Final    Comment: (NOTE) The Xpert Xpress SARS-CoV-2/FLU/RSV plus assay is intended as an aid in the diagnosis of influenza from Nasopharyngeal swab specimens and should not be used as a sole basis for treatment. Nasal washings and aspirates are unacceptable for Xpert Xpress SARS-CoV-2/FLU/RSV testing.  Fact Sheet for Patients: EntrepreneurPulse.com.au  Fact Sheet for Healthcare Providers: IncredibleEmployment.be  This test is not yet approved or cleared by the [place] FDA and has been authorized for detection and/or diagnosis of SARS-CoV-2 by FDA under an Emergency Use Authorization (EUA). This EUA will remain in effect (meaning this test can be used) for the duration of the COVID-19 declaration under Section 564(b)(1) of the Act, 21 U.S.C. section 360bbb-3(b)(1), unless the authorization is terminated or revoked.  Performed at [facility], [place] [address] [place], [place] [address]   SARS CORONAVIRUS 2 (TAT 6-24 HRS) Nasopharyngeal Nasopharyngeal Swab     Status: None   Collection Time: 06/29/20 11:32 AM   Specimen: Nasopharyngeal Swab  Result Value Ref Range Status   SARS Coronavirus 2 NEGATIVE NEGATIVE Final    Comment: (NOTE) SARS-CoV-2 target nucleic acids are NOT DETECTED.  The SARS-CoV-2 RNA is generally detectable in upper and lower respiratory specimens during the acute phase of infection. Negative results do not preclude SARS-CoV-2 infection, do not rule out co-infections with other pathogens, and should not be used as the sole basis for treatment or other patient management decisions. Negative results must be combined with clinical observations, patient history, and epidemiological information. The expected result is Negative.  Fact Sheet for  Patients: SugarRoll.be  Fact Sheet for Healthcare Providers: https://www.woods-mathews.com/  This test is not yet approved or cleared by the [place] FDA and  has been authorized for detection and/or diagnosis of SARS-CoV-2 by FDA under an Emergency Use Authorization (EUA). This EUA will remain  in effect (meaning this test can be used) for the duration of the COVID-19 declaration under Se ction 564(b)(1) of the Act, 21 U.S.C. section 360bbb-3(b)(1), unless the authorization is terminated or revoked sooner.  Performed at [facility], [place] [address], [place], [place] [address]      Time coordinating discharge:  35 minutes  SIGNED:   Barb Merino, MD  [facility] 06/30/2020, 10:36 AM

## 2020-06-30 NOTE — Plan of Care (Signed)
  Problem: Activity: Goal: Risk for activity intolerance will decrease Outcome: Adequate for Discharge   Problem: Pain Managment: Goal: General experience of comfort will improve Outcome: Adequate for Discharge   

## 2020-07-03 DIAGNOSIS — I482 Chronic atrial fibrillation, unspecified: Secondary | ICD-10-CM | POA: Diagnosis not present

## 2020-07-03 DIAGNOSIS — I10 Essential (primary) hypertension: Secondary | ICD-10-CM | POA: Diagnosis not present

## 2020-07-03 DIAGNOSIS — S12001D Unspecified nondisplaced fracture of first cervical vertebra, subsequent encounter for fracture with routine healing: Secondary | ICD-10-CM | POA: Diagnosis not present

## 2020-07-03 DIAGNOSIS — S12100D Unspecified displaced fracture of second cervical vertebra, subsequent encounter for fracture with routine healing: Secondary | ICD-10-CM | POA: Diagnosis not present

## 2020-07-03 DIAGNOSIS — W19XXXD Unspecified fall, subsequent encounter: Secondary | ICD-10-CM | POA: Diagnosis not present

## 2020-07-07 DIAGNOSIS — S12100D Unspecified displaced fracture of second cervical vertebra, subsequent encounter for fracture with routine healing: Secondary | ICD-10-CM | POA: Diagnosis not present

## 2020-08-04 DIAGNOSIS — Z8673 Personal history of transient ischemic attack (TIA), and cerebral infarction without residual deficits: Secondary | ICD-10-CM | POA: Diagnosis not present

## 2020-08-04 DIAGNOSIS — I482 Chronic atrial fibrillation, unspecified: Secondary | ICD-10-CM | POA: Diagnosis not present

## 2020-08-04 DIAGNOSIS — S12031D Nondisplaced posterior arch fracture of first cervical vertebra, subsequent encounter for fracture with routine healing: Secondary | ICD-10-CM | POA: Diagnosis not present

## 2020-08-04 DIAGNOSIS — I10 Essential (primary) hypertension: Secondary | ICD-10-CM | POA: Diagnosis not present

## 2020-08-18 DIAGNOSIS — S12100D Unspecified displaced fracture of second cervical vertebra, subsequent encounter for fracture with routine healing: Secondary | ICD-10-CM | POA: Diagnosis not present

## 2020-08-21 ENCOUNTER — Telehealth: Payer: Self-pay | Admitting: Internal Medicine

## 2020-08-21 NOTE — Telephone Encounter (Signed)
See below

## 2020-08-21 NOTE — Telephone Encounter (Signed)
Samantha Bishop w/ [facility] is requesting verbals for Dr. Sharlet Salina to sign home health orders. Please advise   Okay to LVM: [phone]

## 2020-08-25 NOTE — Telephone Encounter (Signed)
Fine

## 2020-08-26 NOTE — Telephone Encounter (Signed)
Spoke with Samantha Bishop to give verbal orders. No other questions or concerns at this time.

## 2020-09-12 ENCOUNTER — Ambulatory Visit (INDEPENDENT_AMBULATORY_CARE_PROVIDER_SITE_OTHER): Payer: Medicare Other | Admitting: Internal Medicine

## 2020-09-12 ENCOUNTER — Other Ambulatory Visit: Payer: Self-pay

## 2020-09-12 ENCOUNTER — Encounter: Payer: Self-pay | Admitting: Internal Medicine

## 2020-09-12 DIAGNOSIS — I10 Essential (primary) hypertension: Secondary | ICD-10-CM

## 2020-09-12 DIAGNOSIS — I480 Paroxysmal atrial fibrillation: Secondary | ICD-10-CM | POA: Diagnosis not present

## 2020-09-12 DIAGNOSIS — S12000A Unspecified displaced fracture of first cervical vertebra, initial encounter for closed fracture: Secondary | ICD-10-CM

## 2020-09-12 NOTE — Progress Notes (Signed)
   Subjective:   Patient ID: Samantha Bishop, female    DOB: 07-25-1924, 87 y.o.   MRN: [id number]  HPI The patient is a 87 YO female coming in for follow up with new cervical fracture since our last visit. Was in hospital and then rehab. Is now back at home and following up with neurosurgery next week is hoping to get her c-collar off. Denies new chest pains or SOB. Did have fall to cause fracture but denies any since. Is getting around unassisted at home and cane when leaving house. Working out in yard some which she has always enjoyed. Niece present with her today.   Review of Systems  Constitutional: Negative.   HENT: Negative.   Eyes: Negative.   Respiratory: Negative for cough, chest tightness and shortness of breath.   Cardiovascular: Negative for chest pain, palpitations and leg swelling.  Gastrointestinal: Negative for abdominal distention, abdominal pain, constipation, diarrhea, nausea and vomiting.  Musculoskeletal: Positive for neck pain and neck stiffness.  Skin: Negative.   Neurological: Negative.   Psychiatric/Behavioral: Negative.     Objective:  Physical Exam Constitutional:      Appearance: She is well-developed.  HENT:     Head: Normocephalic and atraumatic.  Cardiovascular:     Rate and Rhythm: Normal rate and regular rhythm.  Pulmonary:     Effort: Pulmonary effort is normal. No respiratory distress.     Breath sounds: Normal breath sounds. No wheezing or rales.  Abdominal:     General: Bowel sounds are normal. There is no distension.     Palpations: Abdomen is soft.     Tenderness: There is no abdominal tenderness. There is no rebound.  Musculoskeletal:     Cervical back: Normal range of motion.     Comments: Cervical collar in place  Skin:    General: Skin is warm and dry.  Neurological:     Mental Status: She is alert and oriented to person, place, and time.     Coordination: Coordination normal.     Comments: Cane with her today, gait good in office      Vitals:   09/12/20 1352  BP: 130/80  Pulse: 89  Resp: 18  Temp: 98.3 F (36.8 C)  TempSrc: Oral  SpO2: 94%  Weight: 170 lb 6.4 oz (77.3 kg)  Height: 5\' 6"  (1.676 m)    This visit occurred during the SARS-CoV-2 public health emergency.  Safety protocols were in place, including screening questions prior to the visit, additional usage of staff PPE, and extensive cleaning of exam room while observing appropriate contact time as indicated for disinfecting solutions.   Assessment & Plan:

## 2020-09-12 NOTE — Assessment & Plan Note (Signed)
BP at goal on hctz. Recent labs from hospital reviewed and no repeat needed today.

## 2020-09-12 NOTE — Patient Instructions (Signed)
We do not need labs today.    

## 2020-09-12 NOTE — Assessment & Plan Note (Addendum)
Taking eliquis and not on beta blocker at this time. Sounds regular today.

## 2020-09-12 NOTE — Assessment & Plan Note (Signed)
Still in c-collar and seeing neurosurgery next week for progression of clearance regarding this. Pain is well controlled at this time.

## 2020-09-15 DIAGNOSIS — Z6827 Body mass index (BMI) 27.0-27.9, adult: Secondary | ICD-10-CM | POA: Diagnosis not present

## 2020-09-15 DIAGNOSIS — I10 Essential (primary) hypertension: Secondary | ICD-10-CM | POA: Diagnosis not present

## 2020-09-15 DIAGNOSIS — S12100D Unspecified displaced fracture of second cervical vertebra, subsequent encounter for fracture with routine healing: Secondary | ICD-10-CM | POA: Diagnosis not present

## 2020-09-18 ENCOUNTER — Other Ambulatory Visit: Payer: Self-pay | Admitting: Internal Medicine

## 2020-09-18 NOTE — Telephone Encounter (Signed)
Patient requesting refill for  pravastatin (PRAVACHOL) 20 MG tablet hydrochlorothiazide (HYDRODIURIL) 25 MG tablet gabapentin (NEURONTIN) 300 MG capsule apixaban (ELIQUIS) 5 MG TABS tablet    [place], [place] AT [place]

## 2020-09-19 MED ORDER — ELIQUIS 5 MG PO TABS: ORAL_TABLET | ORAL | 3 refills | Status: DC

## 2020-09-19 MED ORDER — HYDROCHLOROTHIAZIDE 12.5 MG PO CAPS: 12.5000 mg | ORAL_CAPSULE | Freq: Every day | ORAL | 3 refills | Status: DC

## 2020-09-19 MED ORDER — GABAPENTIN 300 MG PO CAPS: 600.0000 mg | ORAL_CAPSULE | Freq: Three times a day (TID) | ORAL | 3 refills | Status: DC

## 2020-09-19 MED ORDER — PRAVASTATIN SODIUM 20 MG PO TABS: 20.0000 mg | ORAL_TABLET | Freq: Every day | ORAL | 3 refills | Status: DC

## 2020-09-19 NOTE — Telephone Encounter (Signed)
Sent in to [facility] listed in the message. If this is not intended pharmacy re-route to me.

## 2020-09-19 NOTE — Telephone Encounter (Signed)
The pharmacy on these rxes does not match one listed on med request. Can you change this?

## 2020-09-19 NOTE — Telephone Encounter (Signed)
Left message in voice mail medications requested sent to [place].

## 2020-09-25 ENCOUNTER — Telehealth: Payer: Self-pay | Admitting: Internal Medicine

## 2020-09-25 NOTE — Telephone Encounter (Signed)
See below

## 2020-09-25 NOTE — Telephone Encounter (Signed)
Samantha Bishop with [facility] home health calling, states that once the patient was discharged home from [facility] she had a referral for [facility] PT,OT, and nursing. Patient is refusing home health and will not let them in the home.

## 2020-09-26 NOTE — Telephone Encounter (Signed)
Noted  

## 2020-10-09 ENCOUNTER — Telehealth: Payer: Self-pay

## 2020-10-09 NOTE — Chronic Care Management (AMB) (Signed)
  Chronic Care Management   Outreach Note  10/09/2020 Name: Samantha Bishop MRN: [id number] DOB: 1924/07/03  Samantha Bishop is a 87 y.o. year old female who is a primary care patient of Sharlet Salina Real Cons, MD. I reached out to Samantha Bishop by phone today in response to a referral sent by Samantha Bishop PCP, Samantha Koch, MD    An unsuccessful telephone outreach was attempted today. The patient was referred to the case management team for assistance with care management and care coordination.   Follow Up Plan: A HIPAA compliant phone message was left for the patient providing contact information and requesting a return call.  The care management team will reach out to the patient again over the next 7 days.  If patient returns call to provider office, please advise to call [facility]  at [place], [place], [place], [place] [address] Direct Dial: [phone] [email or web address] Website: [email or web address]

## 2020-10-17 NOTE — Chronic Care Management (AMB) (Signed)
  Chronic Care Management   Outreach Note  10/17/2020 Name: Samantha Bishop MRN: [id number] DOB: 14-Sep-1924  Samantha Bishop is a 87 y.o. year old female who is a primary care patient of Sharlet Salina Real Cons, Samantha Bishop. I reached out to Samantha Bishop by phone today in response to a referral sent by Ms. Macky [facility] [place] Hoyt Koch, Samantha Bishop     A second unsuccessful telephone outreach was attempted today. The patient was referred to the case management team for assistance with care management and care coordination.   Follow Up Plan: A HIPAA compliant phone message was left for the patient providing contact information and requesting a return call.  The care management team will reach out to the patient again over the next 7 days.  If patient returns call to provider office, please advise to call [facility] at [phone]  Noreene Larsson, [place], [place], [place] [address] Direct Dial: [phone] [email or web address] Website: [email or web address]

## 2020-10-17 NOTE — Chronic Care Management (AMB) (Signed)
  Chronic Care Management   Note  10/17/2020 Name: BESS SALTZMAN MRN: [id number] DOB: March 16, 1925  TAKEYA MARQUIS is a 87 y.o. year old female who is a primary care patient of Sharlet Salina Real Cons, MD. I reached out to [name] by phone today in response to a referral sent by Ms. Macky Lower [place] Hoyt Koch, MD     Ms. Tutton was given information about Chronic Care Management services today including:  CCM service includes personalized support from designated clinical staff supervised by her physician, including individualized plan of care and coordination with other care providers 24/7 contact phone numbers for assistance for urgent and routine care needs. Service will only be billed when office clinical staff spend 20 minutes or more in a month to coordinate care. Only one practitioner may furnish and bill the service in a calendar month. The patient may stop CCM services at any time (effective at the end of the month) by phone call to the office staff. The patient will be responsible for cost sharing (co-pay) of up to 20% of the service fee (after annual deductible is met).  Patient did not agree to enrollment in care management services and does not wish to consider at this time.  Follow up plan: Patient declines engagement by the care management team. Appropriate care team members and provider have been notified via electronic communication.   Noreene Larsson, [place], [place], [place] [address] Direct Dial: [phone] [email or web address] Website: [email or web address]

## 2020-12-24 ENCOUNTER — Other Ambulatory Visit: Payer: Self-pay | Admitting: Internal Medicine

## 2021-01-06 DIAGNOSIS — Z961 Presence of intraocular lens: Secondary | ICD-10-CM | POA: Diagnosis not present

## 2021-02-18 ENCOUNTER — Ambulatory Visit: Payer: Medicare Other | Admitting: Internal Medicine

## 2021-02-20 ENCOUNTER — Ambulatory Visit (INDEPENDENT_AMBULATORY_CARE_PROVIDER_SITE_OTHER): Payer: Medicare Other | Admitting: Internal Medicine

## 2021-02-20 ENCOUNTER — Encounter: Payer: Self-pay | Admitting: Internal Medicine

## 2021-02-20 ENCOUNTER — Other Ambulatory Visit: Payer: Self-pay

## 2021-02-20 VITALS — BP 134/82 | HR 87 | Temp 98.6°F | Ht 66.0 in | Wt 176.0 lb

## 2021-02-20 DIAGNOSIS — Z23 Encounter for immunization: Secondary | ICD-10-CM

## 2021-02-20 DIAGNOSIS — R232 Flushing: Secondary | ICD-10-CM | POA: Diagnosis not present

## 2021-02-20 MED ORDER — ESCITALOPRAM OXALATE 5 MG PO TABS: 5.0000 mg | ORAL_TABLET | Freq: Every day | ORAL | 3 refills | Status: DC

## 2021-02-20 NOTE — Progress Notes (Signed)
   Subjective:   Patient ID: Samantha Bishop, female    DOB: 11/04/1924, 87 y.o.   MRN: [id number]  HPI The patient is a 87 YO female coming in for hot flashes.  Review of Systems  Constitutional: Negative.   HENT: Negative.    Eyes: Negative.   Respiratory:  Negative for cough, chest tightness and shortness of breath.   Cardiovascular:  Negative for chest pain, palpitations and leg swelling.  Gastrointestinal:  Negative for abdominal distention, abdominal pain, constipation, diarrhea, nausea and vomiting.  Endocrine: Positive for heat intolerance.  Musculoskeletal: Negative.   Skin: Negative.   Neurological: Negative.   Psychiatric/Behavioral: Negative.     Objective:  Physical Exam Constitutional:      Appearance: She is well-developed.  HENT:     Head: Normocephalic and atraumatic.  Cardiovascular:     Rate and Rhythm: Normal rate and regular rhythm.  Pulmonary:     Effort: Pulmonary effort is normal. No respiratory distress.     Breath sounds: Normal breath sounds. No wheezing or rales.  Abdominal:     General: Bowel sounds are normal. There is no distension.     Palpations: Abdomen is soft.     Tenderness: There is no abdominal tenderness. There is no rebound.  Musculoskeletal:     Cervical back: Normal range of motion.  Skin:    General: Skin is warm and dry.  Neurological:     Mental Status: She is alert and oriented to person, place, and time.     Coordination: Coordination abnormal.     Comments: Cane for ambulation    Vitals:   02/20/21 1454  BP: 134/82  Pulse: 87  Temp: 98.6 F (37 C)  TempSrc: Oral  SpO2: 95%  Weight: 176 lb (79.8 kg)  Height: 5\' 6"  (1.676 m)    This visit occurred during the SARS-CoV-2 public health emergency.  Safety protocols were in place, including screening questions prior to the visit, additional usage of staff PPE, and extensive cleaning of exam room while observing appropriate contact time as indicated for disinfecting  solutions.   Assessment & Plan:  Flu shot given at visit

## 2021-02-20 NOTE — Patient Instructions (Signed)
We have sent in lexapro for the hot flashes to take daily to prevent hot flashes.

## 2021-02-20 NOTE — Assessment & Plan Note (Signed)
Rx lexapro 10 mg daily.

## 2021-03-23 ENCOUNTER — Other Ambulatory Visit: Payer: Self-pay

## 2021-03-23 ENCOUNTER — Encounter (HOSPITAL_COMMUNITY): Payer: Self-pay

## 2021-03-23 ENCOUNTER — Emergency Department (HOSPITAL_COMMUNITY): Payer: Medicare Other

## 2021-03-23 ENCOUNTER — Emergency Department (HOSPITAL_COMMUNITY)
Admission: EM | Admit: 2021-03-23 | Discharge: 2021-03-24 | Disposition: A | Discharge status: Home / Self Care | Payer: Medicare Other | Attending: Emergency Medicine | Admitting: Emergency Medicine

## 2021-03-23 DIAGNOSIS — S12100K Unspecified displaced fracture of second cervical vertebra, subsequent encounter for fracture with nonunion: Secondary | ICD-10-CM

## 2021-03-23 DIAGNOSIS — W01198A Fall on same level from slipping, tripping and stumbling with subsequent striking against other object, initial encounter: Secondary | ICD-10-CM | POA: Diagnosis not present

## 2021-03-23 DIAGNOSIS — R519 Headache, unspecified: Secondary | ICD-10-CM | POA: Diagnosis not present

## 2021-03-23 DIAGNOSIS — Y9389 Activity, other specified: Secondary | ICD-10-CM | POA: Insufficient documentation

## 2021-03-23 DIAGNOSIS — M542 Cervicalgia: Secondary | ICD-10-CM | POA: Diagnosis not present

## 2021-03-23 DIAGNOSIS — Z79899 Other long term (current) drug therapy: Secondary | ICD-10-CM | POA: Insufficient documentation

## 2021-03-23 DIAGNOSIS — Z85828 Personal history of other malignant neoplasm of skin: Secondary | ICD-10-CM | POA: Diagnosis not present

## 2021-03-23 DIAGNOSIS — S0990XA Unspecified injury of head, initial encounter: Secondary | ICD-10-CM | POA: Diagnosis not present

## 2021-03-23 DIAGNOSIS — I4891 Unspecified atrial fibrillation: Secondary | ICD-10-CM | POA: Insufficient documentation

## 2021-03-23 DIAGNOSIS — S62291A Other fracture of first metacarpal bone, right hand, initial encounter for closed fracture: Secondary | ICD-10-CM

## 2021-03-23 DIAGNOSIS — M25561 Pain in right knee: Secondary | ICD-10-CM | POA: Insufficient documentation

## 2021-03-23 DIAGNOSIS — M7989 Other specified soft tissue disorders: Secondary | ICD-10-CM | POA: Diagnosis not present

## 2021-03-23 DIAGNOSIS — S62201A Unspecified fracture of first metacarpal bone, right hand, initial encounter for closed fracture: Secondary | ICD-10-CM | POA: Diagnosis not present

## 2021-03-23 DIAGNOSIS — S62254A Nondisplaced fracture of neck of first metacarpal bone, right hand, initial encounter for closed fracture: Secondary | ICD-10-CM | POA: Insufficient documentation

## 2021-03-23 DIAGNOSIS — Z96653 Presence of artificial knee joint, bilateral: Secondary | ICD-10-CM | POA: Insufficient documentation

## 2021-03-23 DIAGNOSIS — Z7901 Long term (current) use of anticoagulants: Secondary | ICD-10-CM | POA: Diagnosis not present

## 2021-03-23 DIAGNOSIS — I10 Essential (primary) hypertension: Secondary | ICD-10-CM | POA: Insufficient documentation

## 2021-03-23 DIAGNOSIS — S6991XA Unspecified injury of right wrist, hand and finger(s), initial encounter: Secondary | ICD-10-CM | POA: Diagnosis present

## 2021-03-23 DIAGNOSIS — S01111A Laceration without foreign body of right eyelid and periocular area, initial encounter: Secondary | ICD-10-CM | POA: Insufficient documentation

## 2021-03-23 DIAGNOSIS — S12120K Other displaced dens fracture, subsequent encounter for fracture with nonunion: Secondary | ICD-10-CM | POA: Diagnosis not present

## 2021-03-23 MED ORDER — ACETAMINOPHEN 500 MG PO TABS
1000.0000 mg | ORAL_TABLET | Freq: Once | ORAL | Status: AC
  Administered 2021-03-24: 1000 mg via ORAL
  Filled 2021-03-23: qty 2

## 2021-03-23 MED ORDER — HYDROCODONE-ACETAMINOPHEN 5-325 MG PO TABS: 1.0000 | ORAL_TABLET | Freq: Once | ORAL | Status: DC

## 2021-03-23 MED ORDER — ONDANSETRON 4 MG PO TBDP: 4.0000 mg | ORAL_TABLET | Freq: Once | ORAL | Status: DC

## 2021-03-23 NOTE — ED Provider Notes (Addendum)
Emergency Medicine Provider Triage Evaluation Note  Samantha Bishop , a 87 y.o. female  was evaluated in triage.  Pt complains of fall. She tripped on uneven floor. She fell on her right side. Did hit her head. She is on eliquis.   Review of Systems  Positive: Head trauma, right hand pain Negative: LOC, chest pain  Physical Exam  BP (!) 153/90   Pulse 88   Temp 97.9 F (36.6 C) (Oral)   Resp 18   SpO2 99%  Gen:   Awake, no distress   Resp:  Normal effort  MSK:   Moves extremities without difficulty  Other:  Small laceration above right eyebrow. Swelling to right hand. Without TTP to shoulders, elbows, hips, or knees.  Medical Decision Making  Medically screening exam initiated at 7:27 PM.  Appropriate orders placed.  FAVOUR ALESHIRE was informed that the remainder of the evaluation will be completed by another provider, this initial triage assessment does not replace that evaluation, and the importance of remaining in the ED until their evaluation is complete.     Evlyn Courier, PA-C 03/23/21 1929    Evlyn Courier, PA-C 03/23/21 1931    Milton Ferguson, MD 03/27/21 1013

## 2021-03-23 NOTE — ED Triage Notes (Signed)
Pt states she was raking leaves today and fell on her cement patio, she hit her head on the right side and fell on her right hand. Pt takes eliquis. Pt denies LOC.

## 2021-03-24 DIAGNOSIS — S62254A Nondisplaced fracture of neck of first metacarpal bone, right hand, initial encounter for closed fracture: Secondary | ICD-10-CM | POA: Diagnosis not present

## 2021-03-24 NOTE — Progress Notes (Signed)
Orthopedic Tech Progress Note Patient Details:  Samantha Bishop 04-Feb-1925 [id number]  Ortho Devices Type of Ortho Device: Ulna gutter splint, Arm sling Ortho Device/Splint Location: rue Ortho Device/Splint Interventions: Ordered, Application, Adjustment   Post Interventions Patient Tolerated: Well Instructions Provided: Care of device, Adjustment of device  Karolee Stamps 03/24/2021, 2:37 AM

## 2021-03-24 NOTE — ED Notes (Signed)
Ortho tech contacted for splint application

## 2021-03-24 NOTE — ED Provider Notes (Signed)
[facility] DEPT Provider Note   CSN: [id number] Arrival date & time: 03/23/21  1906     History Chief Complaint  Patient presents with   Fall   Head Injury   Right Hand Injury    GUENEVERE ROORDA is a 87 y.o. female.  HPI     87yo female with history of cerebral aneurysm, DVT/PE, hypertension, hyperlipidemia, atrial fibrillation on eliquis, right homonymous hemianopsia, previous C1 arch fracture/odontoid fracture 06/2020 who presents with concern for fall.  Was working outside in the yard when she tripped over a brick and fell hitting her right side on the cement. No LOC> Otherwise in normal state of health. 89th bday coming up.  No lightheadedness, dizziness, chest pain, dyspnea or symptoms preceding fall. Tripped and fell hitting head. DOes have increased neck pain after the fall, right hand pain, right knee pain.  Pain is moderate, worse with movement.  No n/v/numbness/weakness.   Past Medical History:  Diagnosis Date   Anxiety state, unspecified    Calculus of kidney    Cerebral aneurysm, nonruptured    Colon polyp    Complication of anesthesia    "my heart stopped" 2004 knee  surg - see note on chart   DEEP VENOUS THROMBOPHLEBITIS 08/15/2007   Qualifier: History of  By: Lenna Gilford MD, Deborra Medina    Diverticulosis of colon (without mention of hemorrhage)    History of skin cancer    HTN (hypertension)    Hyperlipidemia    Lumbago    Osteoarthrosis, unspecified whether generalized or localized, unspecified site    Permanent atrial fibrillation ([place])    Pulmonary emboli ([place])    following remote knee arthroscopy surgery   Ruptured lumbar disc    Sleep apnea    stop bang score 4    Unspecified hemorrhoids without mention of complication     Patient Active Problem List   Diagnosis Date Noted   Cervical spine fracture ([place]) 06/21/2020   Hypokalemia 06/21/2020   Fall from ground level 06/21/2020   History of CVA (cerebrovascular accident)  06/09/2019   Epistaxis 05/30/2019   Gastritis and gastroduodenitis    Venous stasis ulcer limited to breakdown of skin without varicose veins ([place]) 05/17/2019   Ulcer of right lower extremity, limited to breakdown of skin ([place]) 05/17/2019   Acute post-traumatic headache, not intractable 12/21/2018   Hot flashes 02/09/2017   Adjustment disorder with mixed anxiety and depressed mood 06/28/2016   Right homonymous hemianopsia 06/26/2016   Back pain 04/08/2015   Abnormal CT scan 04/08/2015   Intracranial aneurysm 10/18/2014   Routine general medical examination at a health care facility 03/08/2014   Occipital neuralgia 07/16/2013   Venous insufficiency 07/11/2013   Alopecia 06/08/2012   Osteopenia 02/21/2010   ANXIETY 05/21/2009   Essential hypertension 05/21/2009   Atrial fibrillation - followed by Dr. Caryl Comes 03/03/2009   INTRACRANIAL ANEURYSM 08/16/2007   Hyperlipidemia 08/15/2007   GERD 08/15/2007    Past Surgical History:  Procedure Laterality Date   ABDOMINAL HYSTERECTOMY     APPENDECTOMY     BLOCKED INTESTINE SURGERY     BREAST CYST EXCISION     ESOPHAGOGASTRODUODENOSCOPY (EGD) WITH PROPOFOL N/A 05/30/2019   Procedure: ESOPHAGOGASTRODUODENOSCOPY (EGD) WITH PROPOFOL;  Surgeon: Thornton Park, MD;  Location: [facility];  Service: Gastroenterology;  Laterality: N/A;   HEMHORROIDECTOMY     JOINT REPLACEMENT  2004   RT TOTAL KNEE   KNEE ARTHROSCOPY     X2 L KNEE   KNEE SURGERY  NASAL SURGERY X2     TONSILLECTOMY AND ADENOIDECTOMY     TOTAL KNEE ARTHROPLASTY  09/03/2011   Procedure: TOTAL KNEE ARTHROPLASTY;  Surgeon: Gearlean Alf, MD;  Location: [facility];  Service: Orthopedics;  Laterality: Left;     OB History   No obstetric history on file.     Family History  Problem Relation Age of Onset   Coronary artery disease Father    Stroke Father    Cirrhosis Brother    Colon cancer Son    Colon polyps Neg Hx    Kidney disease Neg Hx    Diabetes Neg Hx      Social History   Tobacco Use   Smoking status: Never   Smokeless tobacco: Never  Substance Use Topics   Alcohol use: Yes    Alcohol/week: 0.0 standard drinks    Comment: Rarely   Drug use: No    Home Medications Prior to Admission medications   Medication Sig Start Date End Date Taking? Authorizing Provider  apixaban (ELIQUIS) 5 MG TABS tablet TAKE 1 TABLET (5 MG TOTAL) BY MOUTH 2 (TWO) TIMES DAILY. 09/19/20   Hoyt Koch, MD  cholecalciferol (VITAMIN D) 25 MCG tablet Take 2 tablets (2,000 Units total) by mouth daily. 06/25/20   Barb Merino, MD  escitalopram (LEXAPRO) 5 MG tablet Take 1 tablet (5 mg total) by mouth daily. 02/20/21   Hoyt Koch, MD  fluticasone Asencion Islam) 50 MCG/ACT nasal spray Place 2 sprays into both nostrils daily. Patient taking differently: Place 2 sprays into both nostrils 2 (two) times daily. 06/29/19   Granville Lewis C, PA-C  gabapentin (NEURONTIN) 300 MG capsule Take 2 capsules (600 mg total) by mouth 3 (three) times daily. 09/19/20   Hoyt Koch, MD  hydrochlorothiazide (MICROZIDE) 12.5 MG capsule Take 1 capsule (12.5 mg total) by mouth daily. 09/19/20   Hoyt Koch, MD  Multiple Vitamin (MULTIVITAMIN WITH MINERALS) TABS tablet Take 1 tablet by mouth daily.    [provider]  NON FORMULARY Diet order: Regular Diet.    [provider]  polyethylene glycol (MIRALAX / GLYCOLAX) 17 g packet Take 17 g by mouth daily as needed for moderate constipation. 06/29/20   Barb Merino, MD  pravastatin (PRAVACHOL) 20 MG tablet Take 1 tablet (20 mg total) by mouth daily at 6 PM. 09/19/20   Hoyt Koch, MD  sodium chloride (OCEAN) 0.65 % SOLN nasal spray Place 1 spray into both nostrils as needed for congestion. Patient taking differently: Place 1 spray into both nostrils 2 (two) times daily as needed for congestion. 07/24/19   Hoyt Koch, MD    Allergies    Codeine and Penicillins  Review of  Systems   Review of Systems  Constitutional:  Negative for fatigue and fever.  HENT:  Negative for congestion.   Respiratory:  Negative for cough and shortness of breath.   Cardiovascular:  Negative for chest pain.  Gastrointestinal:  Negative for abdominal pain, nausea and vomiting.  Musculoskeletal:  Positive for arthralgias and neck pain. Negative for back pain.  Skin:  Positive for wound. Negative for rash.  Neurological:  Positive for headaches. Negative for weakness and numbness.   Physical Exam Updated Vital Signs BP 135/65   Pulse 66   Temp 98.6 F (37 C) (Oral)   Resp 18   Ht 5\' 7"  (1.702 m)   Wt 81.6 kg   SpO2 94%   BMI 28.19 kg/m   Physical Exam Vitals  and nursing note reviewed.  Constitutional:      General: She is not in acute distress.    Appearance: Normal appearance. She is not ill-appearing, toxic-appearing or diaphoretic.  HENT:     Head: Normocephalic.     Comments: Superficial subcentimeter laceration superior to right eyebrow Eyes:     Conjunctiva/sclera: Conjunctivae normal.  Cardiovascular:     Rate and Rhythm: Normal rate and regular rhythm.     Pulses: Normal pulses.  Pulmonary:     Effort: Pulmonary effort is normal. No respiratory distress.  Musculoskeletal:        General: Swelling (right hand diffusely with swelling/contusion) and tenderness (right lateral tibia, right fifth metacarpal) present. No deformity or signs of injury.     Cervical back: Tenderness present. No rigidity.     Comments: Normal cap refill, hand movements limited by swelling but normal movements present   Skin:    General: Skin is warm and dry.     Coloration: Skin is not jaundiced or pale.  Neurological:     General: No focal deficit present.     Mental Status: She is alert and oriented to person, place, and time.    ED Results / Procedures / Treatments   Labs (all labs ordered are listed, but only abnormal results are displayed) Labs Reviewed - No data to  display  EKG None  Radiology CT Head Wo Contrast  Result Date: 03/23/2021 CLINICAL DATA:  Recent fall with headaches and neck pain, initial encounter EXAM: CT HEAD WITHOUT CONTRAST CT CERVICAL SPINE WITHOUT CONTRAST TECHNIQUE: Multidetector CT imaging of the head and cervical spine was performed following the standard protocol without intravenous contrast. Multiplanar CT image reconstructions of the cervical spine were also generated. COMPARISON:  06/26/2020, CT of the cervical spine from 06/21/2020 FINDINGS: CT HEAD FINDINGS Brain: Areas of prior infarct are again seen in the left occipital lobe and right posterior parietal lobe. Mild atrophic changes and chronic white matter ischemic changes are seen. Basal ganglia calcifications are noted. No findings to suggest acute hemorrhage, acute infarction or space-occupying mass lesion are noted. Vascular: No hyperdense vessel or unexpected calcification. Skull: Normal. Negative for fracture or focal lesion. Sinuses/Orbits: No acute finding. Other: None. CT CERVICAL SPINE FINDINGS Alignment: Normal. Skull base and vertebrae: Chronic fracture at the base of the odontoid is noted type 2 in nature similar to that seen on the prior exam although the margins are slightly better corticated. There remains no significant displacement at the fracture site. Degenerative changes between C1 and C2 are noted. Mild anterolisthesis of C3 on C4 is noted of a degenerative nature stable from the prior exam. Multilevel facet hypertrophic changes are seen. No new fracture is identified. Soft tissues and spinal canal: Surrounding soft tissue structures demonstrate heterogeneity within the thyroid without discrete nodule. The overall appearance is similar to that seen on the prior exam. Upper chest: Visualized lung apices are unremarkable. Other: None IMPRESSION: CT of the head: Chronic atrophic and ischemic changes with findings of bilateral prior infarcts. No acute abnormality is  identified. 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 Fracture fragments are well corticated when compared with the prior exam. No significant displacement is noted. Multilevel degenerative change without acute abnormality. Thyroid heterogeneity stable from the prior exam. In the setting of significant comorbidities or limited life expectancy, no follow-up recommended (ref: J Am Coll Radiol. 2015 Feb;12(2): 143-50). Electronically Signed   By: Linus Mako.D.  On: 03/23/2021 20:20   CT CERVICAL SPINE WO CONTRAST  Result Date: 03/23/2021 CLINICAL DATA:  Recent fall with headaches and neck pain, initial encounter EXAM: CT HEAD WITHOUT CONTRAST CT CERVICAL SPINE WITHOUT CONTRAST TECHNIQUE: Multidetector CT imaging of the head and cervical spine was performed following the standard protocol without intravenous contrast. Multiplanar CT image reconstructions of the cervical spine were also generated. COMPARISON:  06/26/2020, CT of the cervical spine from 06/21/2020 FINDINGS: CT HEAD FINDINGS Brain: Areas of prior infarct are again seen in the left occipital lobe and right posterior parietal lobe. Mild atrophic changes and chronic white matter ischemic changes are seen. Basal ganglia calcifications are noted. No findings to suggest acute hemorrhage, acute infarction or space-occupying mass lesion are noted. Vascular: No hyperdense vessel or unexpected calcification. Skull: Normal. Negative for fracture or focal lesion. Sinuses/Orbits: No acute finding. Other: None. CT CERVICAL SPINE FINDINGS Alignment: Normal. Skull base and vertebrae: Chronic fracture at the base of the odontoid is noted type 2 in nature similar to that seen on the prior exam although the margins are slightly better corticated. There remains no significant displacement at the fracture site. Degenerative changes between C1 and C2 are noted. Mild anterolisthesis of C3 on C4 is noted of a degenerative  nature stable from the prior exam. Multilevel facet hypertrophic changes are seen. No new fracture is identified. Soft tissues and spinal canal: Surrounding soft tissue structures demonstrate heterogeneity within the thyroid without discrete nodule. The overall appearance is similar to that seen on the prior exam. Upper chest: Visualized lung apices are unremarkable. Other: None IMPRESSION: CT of the head: Chronic atrophic and ischemic changes with findings of bilateral prior infarcts. No acute abnormality is identified. 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 Fracture fragments are well corticated when compared with the prior exam. No significant displacement is noted. Multilevel degenerative change without acute abnormality. Thyroid heterogeneity stable from the prior exam. In the setting of significant comorbidities or limited life expectancy, no follow-up recommended (ref: J Am Coll Radiol. 2015 Feb;12(2): 143-50). Electronically Signed   By: Inez Catalina M.D.   On: 03/23/2021 20:20   DG Knee Complete 4 Views Right  Result Date: 03/23/2021 CLINICAL DATA:  Right knee pain following fall, initial encounter EXAM: RIGHT KNEE - COMPLETE 4+ VIEW COMPARISON:  05/30/2019 FINDINGS: Right knee prosthesis is noted and stable. No acute fracture or dislocation is noted. No joint effusion is seen. IMPRESSION: No acute abnormality noted Electronically Signed   By: Inez Catalina M.D.   On: 03/23/2021 23:48   DG Hand Complete Right  Result Date: 03/23/2021 CLINICAL DATA:  Swelling and pain.  Fall. EXAM: RIGHT HAND - COMPLETE 3+ VIEW COMPARISON:  None. FINDINGS: There is a comminuted acute fracture of the fifth metacarpal head, mildly impacted. There is 1/2 shaft with posterior displacement of the distal fracture fragment. There is no evidence for dislocation. The bones are diffusely osteopenic. There are moderate degenerative changes of interphalangeal joints diffusely.  There is soft tissue swelling over the dorsum of the hand. There are vascular calcifications in the soft tissues. IMPRESSION: 1. Mildly displaced and impacted fifth metacarpal head fracture. 2. Degenerative changes of the hand. Electronically Signed   By: Ronney Asters M.D.   On: 03/23/2021 20:12    Procedures Procedures   Medications Ordered in ED Medications  acetaminophen (TYLENOL) tablet 1,000 mg (1,000 mg Oral Given 03/24/21 0055)    ED Course  I have reviewed  the triage vital signs and the nursing notes.  Pertinent labs & imaging results that were available during my care of the patient were reviewed by me and considered in my medical decision making (see chart for details).    MDM Rules/Calculators/A&P                           87yo female with history of cerebral aneurysm, DVT/PE, hypertension, hyperlipidemia, atrial fibrillation on eliquis, right homonymous hemianopsia, previous C1 arch fracture/odontoid fracture 06/2020 who presents with concern for mechanical fall while working outside. XR knee without fracture, able to bear weight and ambulate and doubt occult injury.  CT head without acute findings.  CT CSpine with chronic fracture with onunion at base of odontoid, fragments well corticated when compared to prior. Doubt acute injury given this appearance and same injury in February, but she does have pain and will place aspen collar and have her see NSU as an outpatient for further recommendations.  XR hand shows base of 5th metacarpal head fracture. Has significant swelling limiting movements, but fracture is closed and she is NV intact.  Placed in ulnar guttar splint and recommend hand surgery follow up.  Patient discharged in stable condition with understanding of reasons to return.    Final Clinical Impression(s) / ED Diagnoses Final diagnoses:  Injury of head, initial encounter  Closed odontoid fracture with nonunion, subsequent encounter  Closed fracture of head of  first metacarpal bone of right hand, initial encounter    Rx / DC Orders ED Discharge Orders     None        Gareth Morgan, MD 03/24/21 1255

## 2021-03-25 DIAGNOSIS — S62336A Displaced fracture of neck of fifth metacarpal bone, right hand, initial encounter for closed fracture: Secondary | ICD-10-CM | POA: Diagnosis not present

## 2021-04-28 ENCOUNTER — Telehealth: Payer: Self-pay | Admitting: Internal Medicine

## 2021-04-28 NOTE — Telephone Encounter (Signed)
Called pt at [phone]. Unable to leave a voicemail due to the phone constantly ringing.   If patient calls back please find out when she noticed the odor to her urine and if she is having any other symptoms.

## 2021-04-28 NOTE — Telephone Encounter (Signed)
Patient states she has a strong urine smell  Encouraged patient to schedule an ov, patient declined  Patient is requesting a rx for symptom  Please advise

## 2021-04-29 ENCOUNTER — Other Ambulatory Visit: Payer: Self-pay

## 2021-04-29 ENCOUNTER — Ambulatory Visit (INDEPENDENT_AMBULATORY_CARE_PROVIDER_SITE_OTHER): Payer: Medicare Other

## 2021-04-29 ENCOUNTER — Telehealth: Payer: Self-pay

## 2021-04-29 DIAGNOSIS — Z0000 Encounter for general adult medical examination without abnormal findings: Secondary | ICD-10-CM

## 2021-04-29 MED ORDER — NITROFURANTOIN MONOHYD MACRO 100 MG PO CAPS: 100.0000 mg | ORAL_CAPSULE | Freq: Two times a day (BID) | ORAL | 0 refills | Status: DC

## 2021-04-29 NOTE — Progress Notes (Signed)
I connected with Samantha Bishop today by telephone and verified that I am speaking with the correct person using two identifiers. Location patient: home Location provider: work Persons participating in the virtual visit: patient, provider.   I discussed the limitations, risks, security and privacy concerns of performing an evaluation and management service by telephone and the availability of in person appointments. I also discussed with the patient that there may be a patient responsible charge related to this service. The patient expressed understanding and verbally consented to this telephonic visit.    Interactive audio and video telecommunications were attempted between this provider and patient, however failed, due to patient having technical difficulties OR patient did not have access to video capability.  We continued and completed visit with audio only.  Some vital signs may be absent or patient reported.   Time Spent with patient on telephone encounter: 40 minutes Subjective:   Samantha Bishop is a 87 y.o. female who presents for Medicare Annual (Subsequent) preventive examination.  Review of Systems     Cardiac Risk Factors include: advanced age (>22men, >25 women);dyslipidemia;family history of premature cardiovascular disease;hypertension     Objective:    There were no vitals filed for this visit. There is no height or weight on file to calculate BMI.  Advanced Directives 04/29/2021 03/23/2021 06/21/2020 06/27/2019 06/25/2019 06/19/2019 06/08/2019  Does Patient Have a Medical Advance Directive? No No No Yes Yes No No  Type of Advance Directive - - - (No Data) (No Data) - -  Does patient want to make changes to medical advance directive? - - - No - Patient declined No - Patient declined - -  Would patient like information on creating a medical advance directive? No - Patient declined No - Patient declined No - Patient declined - - No - Patient declined No - Patient declined   Pre-existing out of facility DNR order (yellow form or pink MOST form) - - - - - - -    Current Medications (verified) Outpatient Encounter Medications as of 04/29/2021  Medication Sig   apixaban (ELIQUIS) 5 MG TABS tablet TAKE 1 TABLET (5 MG TOTAL) BY MOUTH 2 (TWO) TIMES DAILY.   cholecalciferol (VITAMIN D) 25 MCG tablet Take 2 tablets (2,000 Units total) by mouth daily.   escitalopram (LEXAPRO) 5 MG tablet Take 1 tablet (5 mg total) by mouth daily.   fluticasone (FLONASE) 50 MCG/ACT nasal spray Place 2 sprays into both nostrils daily. (Patient taking differently: Place 2 sprays into both nostrils 2 (two) times daily.)   gabapentin (NEURONTIN) 300 MG capsule Take 2 capsules (600 mg total) by mouth 3 (three) times daily.   hydrochlorothiazide (MICROZIDE) 12.5 MG capsule Take 1 capsule (12.5 mg total) by mouth daily.   Multiple Vitamin (MULTIVITAMIN WITH MINERALS) TABS tablet Take 1 tablet by mouth daily.   nitrofurantoin, macrocrystal-monohydrate, (MACROBID) 100 MG capsule Take 1 capsule (100 mg total) by mouth 2 (two) times daily.   NON FORMULARY Diet order: Regular Diet.   polyethylene glycol (MIRALAX / GLYCOLAX) 17 g packet Take 17 g by mouth daily as needed for moderate constipation.   pravastatin (PRAVACHOL) 20 MG tablet Take 1 tablet (20 mg total) by mouth daily at 6 PM.   sodium chloride (OCEAN) 0.65 % SOLN nasal spray Place 1 spray into both nostrils as needed for congestion. (Patient taking differently: Place 1 spray into both nostrils 2 (two) times daily as needed for congestion.)   No facility-administered encounter medications on file as of 04/29/2021.  Allergies (verified) Codeine and Penicillins   History: Past Medical History:  Diagnosis Date   Anxiety state, unspecified    Calculus of kidney    Cerebral aneurysm, nonruptured    Colon polyp    Complication of anesthesia    "my heart stopped" 2004 knee  surg - see note on chart   DEEP VENOUS THROMBOPHLEBITIS  08/15/2007   Qualifier: History of  By: Lenna Gilford MD, Deborra Medina    Diverticulosis of colon (without mention of hemorrhage)    History of skin cancer    HTN (hypertension)    Hyperlipidemia    Lumbago    Osteoarthrosis, unspecified whether generalized or localized, unspecified site    Permanent atrial fibrillation ([place])    Pulmonary emboli ([place])    following remote knee arthroscopy surgery   Ruptured lumbar disc    Sleep apnea    stop bang score 4    Unspecified hemorrhoids without mention of complication    Past Surgical History:  Procedure Laterality Date   ABDOMINAL HYSTERECTOMY     APPENDECTOMY     BLOCKED INTESTINE SURGERY     BREAST CYST EXCISION     ESOPHAGOGASTRODUODENOSCOPY (EGD) WITH PROPOFOL N/A 05/30/2019   Procedure: ESOPHAGOGASTRODUODENOSCOPY (EGD) WITH PROPOFOL;  Surgeon: Thornton Park, MD;  Location: [facility];  Service: Gastroenterology;  Laterality: N/A;   HEMHORROIDECTOMY     JOINT REPLACEMENT  2004   RT TOTAL KNEE   KNEE ARTHROSCOPY     X2 L KNEE   KNEE SURGERY     NASAL SURGERY X2     TONSILLECTOMY AND ADENOIDECTOMY     TOTAL KNEE ARTHROPLASTY  09/03/2011   Procedure: TOTAL KNEE ARTHROPLASTY;  Surgeon: Gearlean Alf, MD;  Location: [facility];  Service: Orthopedics;  Laterality: Left;   Family History  Problem Relation Age of Onset   Coronary artery disease Father    Stroke Father    Cirrhosis Brother    Colon cancer Son    Colon polyps Neg Hx    Kidney disease Neg Hx    Diabetes Neg Hx    Social History   Socioeconomic History   Marital status: Single    Spouse name: Not on file   Number of children: 3   Years of education: Not on file   Highest education level: Not on file  Occupational History   Occupation: Retired  Tobacco Use   Smoking status: Never   Smokeless tobacco: Never  Substance and Sexual Activity   Alcohol use: Yes    Alcohol/week: 0.0 standard drinks    Comment: Rarely   Drug use: No   Sexual activity: Not Currently  Other  Topics Concern   Not on file  Social History Narrative   Not on file   Social Determinants of Health   Financial Resource Strain: Low Risk    Difficulty of Paying Living Expenses: Not hard at all  Food Insecurity: No Food Insecurity   Worried About Charity fundraiser in the Last Year: Never true   [place] in the Last Year: Never true  Transportation Needs: No Transportation Needs   Lack of Transportation (Medical): No   Lack of Transportation (Non-Medical): No  Physical Activity: Inactive   Days of Exercise per Week: 0 days   Minutes of Exercise per Session: 0 min  Stress: No Stress Concern Present   Feeling of Stress : Not at all  Social Connections: Moderately Integrated   Frequency of Communication with Friends and Family:  More than three times a week   Frequency of Social Gatherings with Friends and Family: Once a week   Attends Religious Services: More than 4 times per year   Active Member of [employer] or Organizations: Yes   Attends Archivist Meetings: More than 4 times per year   Marital Status: Widowed    Tobacco Counseling Counseling given: Not Answered   Clinical Intake:  Pre-visit preparation completed: Yes  Pain : No/denies pain     Nutritional Risks: None Diabetes: No  How often do you need to have someone help you when you read instructions, pamphlets, or other written materials from your doctor or pharmacy?: 1 - Never What is the last grade level you completed in school?: 10th grade  Diabetic? no  Interpreter Needed?: No  Information entered by :: Lisette Abu, LPN   Activities of Daily Living In your present state of health, do you have any difficulty performing the following activities: 04/29/2021 06/22/2020  Hearing? Y N  Vision? N N  Difficulty concentrating or making decisions? N N  Walking or climbing stairs? Y N  Dressing or bathing? N Y  Doing errands, shopping? N N  Preparing Food and eating ? N -  Using the  Toilet? N -  In the past six months, have you accidently leaked urine? N -  Do you have problems with loss of bowel control? N -  Managing your Medications? N -  Managing your Finances? N -  Some recent data might be hidden    Patient Care Team: Hoyt Koch, MD as PCP - General (Internal Medicine)  Indicate any recent Medical Services you may have received from other than [facility] providers in the past year (date may be approximate).     Assessment:   This is a routine wellness examination for [facility].  Hearing/Vision screen Hearing Screening - Comments:: Patient wears hearing aids. Vision Screening - Comments:: Patient does not wear any corrective lenses.  Annual eye exam done by: Dr. Rutherford Guys.  Dietary issues and exercise activities discussed: Current Exercise Habits: The patient does not participate in regular exercise at present, Exercise limited by: cardiac condition(s);Other - see comments (history of frequent falls)   Goals Addressed               This Visit's Progress     Patient Stated (pt-stated)        Patient declined health goal at this time.      Depression Screen PHQ 2/9 Scores 04/29/2021 03/20/2020 02/09/2018 02/08/2017 04/08/2015 08/22/2012  PHQ - 2 Score 1 0 1 0 0 1    Fall Risk Fall Risk  04/29/2021 09/12/2020 05/17/2019 02/09/2018 02/08/2017  Falls in the past year? 1 1 0 Yes Yes  Comment - - - - -  Number falls in past yr: 1 0 0 2 or more 1  Injury with Fall? 1 1 0 Yes Yes  Risk for fall due to : History of fall(s);Impaired balance/gait - Impaired balance/gait - -  Follow up Falls prevention discussed - Falls evaluation completed - -    FALL RISK PREVENTION PERTAINING TO THE HOME:  Any stairs in or around the home? No  If so, are there any without handrails? No  Home free of loose throw rugs in walkways, pet beds, electrical cords, etc? Yes  Adequate lighting in your home to reduce risk of falls? Yes   ASSISTIVE DEVICES UTILIZED TO PREVENT  FALLS:  Life alert? No  Use of a  cane, walker or w/c? Yes  Grab bars in the bathroom? Yes  Shower chair or bench in shower? Yes  Elevated toilet seat or a handicapped toilet? No   TIMED UP AND GO:  Was the test performed? No .  Length of time to ambulate 10 feet: n/a sec.   Gait slow and steady with assistive device  Cognitive Function: Normal cognitive status assessed by direct observation by this Nurse Health Advisor. No abnormalities found.          Immunizations Immunization History  Administered Date(s) Administered   Fluad Quad(high Dose 65+) 03/26/2019, 03/20/2020, 02/20/2021   Influenza Split 02/03/2011   Influenza Whole 02/07/2008, 01/19/2010   Influenza,inj,Quad PF,6+ Mos 02/21/2013, 03/07/2014   PFIZER(Purple Top)SARS-COV-2 Vaccination 06/14/2019, 07/05/2019   Pneumococcal Polysaccharide-23 02/03/2011    TDAP status: Due, Education has been provided regarding the importance of this vaccine. Advised may receive this vaccine at local pharmacy or Health Dept. Aware to provide a copy of the vaccination record if obtained from local pharmacy or Health Dept. Verbalized acceptance and understanding.  Flu Vaccine status: Up to date  Pneumococcal vaccine status: Due, Education has been provided regarding the importance of this vaccine. Advised may receive this vaccine at local pharmacy or Health Dept. Aware to provide a copy of the vaccination record if obtained from local pharmacy or Health Dept. Verbalized acceptance and understanding.  Covid-19 vaccine status: Completed vaccines  Qualifies for Shingles Vaccine? Yes   Zostavax completed No   Shingrix Completed?: No.    Education has been provided regarding the importance of this vaccine. Patient has been advised to call insurance company to determine out of pocket expense if they have not yet received this vaccine. Advised may also receive vaccine at local pharmacy or Health Dept. Verbalized acceptance and  understanding.  Screening Tests Health Maintenance  Topic Date Due   Zoster Vaccines- Shingrix (1 of 2) Never done   Pneumonia Vaccine 25+ Years old (2 - PCV) 02/03/2012   TETANUS/TDAP  05/10/2018   COVID-19 Vaccine (3 - Booster for Pfizer series) 08/30/2019   INFLUENZA VACCINE  Completed   DEXA SCAN  Completed   HPV VACCINES  Aged Out    Health Maintenance  Health Maintenance Due  Topic Date Due   Zoster Vaccines- Shingrix (1 of 2) Never done   Pneumonia Vaccine 73+ Years old (2 - PCV) 02/03/2012   TETANUS/TDAP  05/10/2018   COVID-19 Vaccine (3 - Booster for [place] series) 08/30/2019    Colorectal cancer screening: No longer required.   Mammogram status: No longer required due to age.  Bone Density status: No longer required.  Lung Cancer Screening: (Low Dose CT Chest recommended if Age 86-80 years, 30 pack-year currently smoking OR have quit w/in 15years.) does not qualify.   Lung Cancer Screening Referral: no  Additional Screening:  Hepatitis C Screening: does not qualify; Completed no  Vision Screening: Recommended annual ophthalmology exams for early detection of glaucoma and other disorders of the eye. Is the patient up to date with their annual eye exam?  Yes  Who is the provider or what is the name of the office in which the patient attends annual eye exams? Rutherford Guys, MD. If pt is not established with a provider, would they like to be referred to a provider to establish care? No .   Dental Screening: Recommended annual dental exams for proper oral hygiene  Community Resource Referral / Chronic Care Management: CRR required this visit?  No   CCM  required this visit?  No      Plan:     I have personally reviewed and noted the following in the patients chart:   Medical and social history Use of alcohol, tobacco or illicit drugs  Current medications and supplements including opioid prescriptions.  Functional ability and status Nutritional  status Physical activity Advanced directives List of other physicians Hospitalizations, surgeries, and ER visits in previous 12 months Vitals Screenings to include cognitive, depression, and falls Referrals and appointments  In addition, I have reviewed and discussed with patient certain preventive protocols, quality metrics, and best practice recommendations. A written personalized care plan for preventive services as well as general preventive health recommendations were provided to patient.     Sheral Flow, LPN   [id number]   Nurse Notes:  Patient is cogitatively intact. There were no vitals filed for this visit. There is no height or weight on file to calculate BMI.

## 2021-04-29 NOTE — Telephone Encounter (Signed)
Refill has been sent in.  

## 2021-04-29 NOTE — Telephone Encounter (Signed)
Sent in macrobid 1 pill twice a day. If no improvement needs visit.

## 2021-04-29 NOTE — Telephone Encounter (Signed)
Patient called back in today  Patient says she has been having this odor for about 3 weeks w/ no other symptoms  Says she is needing a call back asap & needs something sent over to pharmacy

## 2021-04-29 NOTE — Addendum Note (Signed)
Addended by: Pricilla Holm A on: 04/29/2021 11:12 AM   Modules accepted: Orders

## 2021-04-29 NOTE — Telephone Encounter (Signed)
See below

## 2021-06-08 ENCOUNTER — Telehealth: Payer: Self-pay

## 2021-06-08 NOTE — Telephone Encounter (Signed)
Pt is calling requesting a Refill on: apixaban (ELIQUIS) 5 MG TABS tablet   Pharmacy: [id number] - [place], [place] - [address] AT [place]  LOV: 02/20/21

## 2021-06-09 MED ORDER — APIXABAN 5 MG PO TABS: ORAL_TABLET | ORAL | 3 refills | Status: DC

## 2021-06-09 NOTE — Telephone Encounter (Signed)
Refill has been sent to the pt's pharmacy  

## 2021-06-10 DIAGNOSIS — M65331 Trigger finger, right middle finger: Secondary | ICD-10-CM | POA: Diagnosis not present

## 2021-07-01 NOTE — Progress Notes (Signed)
07/02/2021 Samantha Bishop [id number] 1924/09/21   ASSESSMENT AND PLAN:   Rectal bleeding and rectal pain- -     HYDROCORTISONE ACE, RECTAL, 30 MG SUPP; Place 1 suppository (30 mg total) rectally at bedtime. From rectal exam, likely internal hemorrhoids versus fissure, recent normal labs.  Will do suppository for 2 weeks, if not improving will suggest CT AB and pelvis with contrast.  If that is unrevealing and continuing symtoms can consider flex sig or endoscopic evaluation however with age and comorbidites will avoid at this time.   B12 deficiency Get on Sublingual and follow up with PCP  Constipation, unspecified constipation type - Increase fiber/ water intake, decrease caffeine, increase activity level, can add miralax/fiber   Patient Care Team: Hoyt Koch, MD as PCP - General (Internal Medicine)  HISTORY OF PRESENT ILLNESS: 88 y.o. female referred by Hoyt Koch, *, with a past medical history of hypertension, atrial fibrillation on Eliquis 5 mg twice daily following with Dr. Marlou Porch,, gastritis with GERD and others listed below presents for evaluation of rectal bleeding and burning.   Previously known to Dr. Fuller Plan, last seen 2017.   Patient was seen in the hospital 05/30/2019 for inpatient endoscopy Dr. Tarri Glenn showed gastritis otherwise unremarkable.  Has been having falls, had nondisplaced cervical fracture 06/2020.  Patient has long standing history of anemia since 2013, baseline appears to be 10-11.Normocytic  B12 was 187 in 2019 Had soft brown, long stools that are formed.  Will have LLQ pain intermittently.  She sees small volume BRB on TP intermittent, states she has internal rectal burning after the BM  No pelvic radiation.  She is on miralax AS needed and not on fiber supplement.  No weight loss.   External labs and notes reviewed this visit: CBC  06/23/2020 -looking through records appears to have had chronic anemia ranging from 10-11  since 2021, and dating as far back as 2013 and 2010 HGB 10.9 MCV 92.7 with normocytic anemia WBC 9.6 Platelets 177 Did have a low B12 02/2018 of 187 Kidney function 06/25/2020  BUN 18 Cr 0.66  GFR >60  Potassium 3.4   LFTs 06/22/2020  AST 18 ALT 13 Alkphos 59 TBili 0.9 06/22/2020 TSH 1.093  05/30/2019 inpatient endoscopy for upper GI bleed with  Dr. Tarri Glenn showed normal esophagus, erythematous gastric fundus and body without stigmata of bleeding. 06/08/2005 colonoscopy Dr. Fuller Plan showed diverticulosis no other abnormalities.  Current Medications:    Current Outpatient Medications (Cardiovascular):    hydrochlorothiazide (MICROZIDE) 12.5 MG capsule, Take 1 capsule (12.5 mg total) by mouth daily.   pravastatin (PRAVACHOL) 20 MG tablet, Take 1 tablet (20 mg total) by mouth daily at 6 PM.  Current Outpatient Medications (Respiratory):    fluticasone (FLONASE) 50 MCG/ACT nasal spray, Place 2 sprays into both nostrils daily. (Patient taking differently: Place 2 sprays into both nostrils 2 (two) times daily.)   sodium chloride (OCEAN) 0.65 % SOLN nasal spray, Place 1 spray into both nostrils as needed for congestion. (Patient taking differently: Place 1 spray into both nostrils 2 (two) times daily as needed for congestion.)   Current Outpatient Medications (Hematological):    apixaban (ELIQUIS) 5 MG TABS tablet, TAKE 1 TABLET (5 MG TOTAL) BY MOUTH 2 (TWO) TIMES DAILY.  Current Outpatient Medications (Other):    cholecalciferol (VITAMIN D) 25 MCG tablet, Take 2 tablets (2,000 Units total) by mouth daily.   escitalopram (LEXAPRO) 5 MG tablet, Take 1 tablet (5 mg total) by mouth daily.  gabapentin (NEURONTIN) 300 MG capsule, Take 2 capsules (600 mg total) by mouth 3 (three) times daily.   HYDROCORTISONE ACE, RECTAL, 30 MG SUPP, Place 1 suppository (30 mg total) rectally at bedtime.   Multiple Vitamin (MULTIVITAMIN WITH MINERALS) TABS tablet, Take 1 tablet by mouth daily.   NON FORMULARY,  Diet order: Regular Diet.   polyethylene glycol (MIRALAX / GLYCOLAX) 17 g packet, Take 17 g by mouth daily as needed for moderate constipation.  Medical History:  Past Medical History:  Diagnosis Date   Anxiety state, unspecified    Calculus of kidney    Cerebral aneurysm, nonruptured    Colon polyp    Complication of anesthesia    "my heart stopped" 2004 knee  surg - see note on chart   DEEP VENOUS THROMBOPHLEBITIS 08/15/2007   Qualifier: History of  By: Lenna Gilford MD, Deborra Medina    Diverticulosis of colon (without mention of hemorrhage)    History of skin cancer    HTN (hypertension)    Hyperlipidemia    Lumbago    Osteoarthrosis, unspecified whether generalized or localized, unspecified site    Permanent atrial fibrillation ([place])    Pulmonary emboli ([place])    following remote knee arthroscopy surgery   Ruptured lumbar disc    Sleep apnea    stop bang score 4    Unspecified hemorrhoids without mention of complication    Allergies:  Allergies  Allergen Reactions   Codeine Nausea Only   Penicillins Rash     Surgical History:  She  has a past surgical history that includes Tonsillectomy and adenoidectomy; Appendectomy; Abdominal hysterectomy; Knee surgery; Breast cyst excision; BLOCKED INTESTINE SURGERY; Knee arthroscopy; Joint replacement (2004); NASAL SURGERY X2; HEMHORROIDECTOMY; Total knee arthroplasty (09/03/2011); and Esophagogastroduodenoscopy (egd) with propofol (N/A, 05/30/2019). Family History:  Her family history includes Cirrhosis in her brother; Coronary artery disease in her father; Stroke in her father. Social History:   reports that she has never smoked. She has never used smokeless tobacco. She reports that she does not currently use alcohol. She reports that she does not use drugs.  REVIEW OF SYSTEMS  : All other systems reviewed and negative except where noted in the History of Present Illness.   PHYSICAL EXAM: BP 106/60 (BP Location: Left Arm, Patient Position:  Sitting, Cuff Size: Normal)    Pulse 76 Comment: irregular   Ht 5' 2.5" (1.588 m) Comment: height measured without shoes   Wt 173 lb 4 oz (78.6 kg)    BMI 31.18 kg/m  General:   Pleasant, well developed female in no acute distress Head:  Normocephalic and atraumatic. Eyes: sclerae anicteric,conjunctive pink  Heart:  irreg irreg systolic murmur Pulm: Clear anteriorly; no wheezing Abdomen:  Soft, Obese AB, skin exam scars- lower AB from umblicus to suprapubic, Normal bowel sounds. mild tenderness in the lower abdomen. Without guarding and Without rebound, without hepatomegaly. Rectal: Normal external rectal exam, decreased rectal tone with cavernous vault, + internal hemorrhoids appreciated, no masses, mild tenderness, no stool in vault, hemoccult negative Extremities:  Without edema. Msk:  Symmetrical without gross deformities. Peripheral pulses intact.  Neurologic:  Alert and  oriented x4;  grossly normal neurologically. Walk with a cane.  Skin:   Dry and intact without significant lesions or rashes. Psychiatric: Demonstrates good judgement and reason without abnormal affect or behaviors.   Vladimir Crofts, PA-C 12:10 PM

## 2021-07-02 ENCOUNTER — Telehealth: Payer: Self-pay | Admitting: Physician Assistant

## 2021-07-02 ENCOUNTER — Ambulatory Visit (INDEPENDENT_AMBULATORY_CARE_PROVIDER_SITE_OTHER): Payer: Medicare Other | Admitting: Physician Assistant

## 2021-07-02 ENCOUNTER — Encounter: Payer: Self-pay | Admitting: Physician Assistant

## 2021-07-02 VITALS — BP 106/60 | HR 76 | Ht 62.5 in | Wt 173.2 lb

## 2021-07-02 DIAGNOSIS — E538 Deficiency of other specified B group vitamins: Secondary | ICD-10-CM | POA: Diagnosis not present

## 2021-07-02 DIAGNOSIS — K5900 Constipation, unspecified: Secondary | ICD-10-CM | POA: Diagnosis not present

## 2021-07-02 DIAGNOSIS — K6289 Other specified diseases of anus and rectum: Secondary | ICD-10-CM

## 2021-07-02 DIAGNOSIS — K625 Hemorrhage of anus and rectum: Secondary | ICD-10-CM

## 2021-07-02 MED ORDER — HYDROCORTISONE ACETATE 30 MG RE SUPP: 30.0000 mg | Freq: Every day | RECTAL | 1 refills | Status: DC

## 2021-07-02 NOTE — Telephone Encounter (Signed)
Inbound call from pt's niece requesting a call back wanting to see if there is an alternative for hydrocortisone. The medication is a little expensive. Please advise. Thank you.

## 2021-07-02 NOTE — Patient Instructions (Addendum)
Please do sitz baths, increase fiber or add benefiber, increase water and increase acitivity.  Will send in hydrocoritsone suppository Add Sublingual B12 from store can be 500-1050mcg, follow up with PCP about that dose.   How to Take a [employer] A sitz bath is a warm water bath that may be used to care for your rectum, genital area, or the area between your rectum and genitals (perineum). In a sitz bath, the water only comes up to your hips and covers your buttocks. A sitz bath may be done in a bathtub or with a portable sitz bath that fits over the toilet. Your health care provider may recommend a sitz bath to help: Relieve pain and discomfort after delivering a baby. Relieve pain and itching from hemorrhoids or anal fissures. Relieve pain after certain surgeries. Relax muscles that are sore or tight. How to take a sitz bath Take 3-4 sitz baths a day, or as many as told by your health care provider. Bathtub sitz bath To take a sitz bath in a bathtub: Partially fill a bathtub with warm water. The water should be deep enough to cover your hips and buttocks when you are sitting in the tub. Follow your health care provider's instructions if you are told to put medicine in the water. Sit in the water. Open the tub drain a little, and leave it open during your bath. Turn on the warm water again, enough to replace the water that is draining out. Keep the water running throughout your bath. This helps keep the water at the right level and temperature. Soak in the water for 15-20 minutes, or as long as told by your health care provider. When you are done, be careful when you stand up. You may feel dizzy. After the sitz bath, pat yourself dry. Do not rub your skin to dry it.  Over-the-toilet sitz bath To take a sitz bath with an over-the-toilet basin: Follow the manufacturer's instructions. Fill the basin with warm water. Follow your health care provider's instructions if you were told to put  medicine in the water. Sit on the seat. Make sure the water covers your buttocks and perineum. Soak in the water for 15-20 minutes, or as long as told by your health care provider. After the sitz bath, pat yourself dry. Do not rub your skin to dry it. Clean and dry the basin between uses. Discard the basin if it cracks, or according to the manufacturer's instructions.  Contact a health care provider if: Your pain or itching gets worse. Do not continue with sitz baths if your symptoms get worse. You have new symptoms. Do not continue with sitz baths until you talk with your health care provider. Summary A sitz bath is a warm water bath in which the water only comes up to your hips and covers your buttocks. A sitz bath may help relieve pain and discomfort after delivering a baby. It also may help with pain and itching from hemorrhoids or anal fissures, or pain after certain surgeries. It can also help to relax muscles that are sore or tight. Take 3-4 sitz baths a day, or as many as told by your health care provider. Soak in the water for 15-20 minutes. Do not continue with sitz baths if your symptoms get worse. This information is not intended to replace advice given to you by your health care provider. Make sure you discuss any questions you have with your health care provider. Document Revised: 01/10/2020 Document Reviewed: 01/10/2020 Elsevier  Patient Education  2022 [employer].   About Hemorrhoids  Hemorrhoids are swollen veins in the lower rectum and anus.  Also called piles, hemorrhoids are a common problem.  Hemorrhoids may be internal (inside the rectum) or external (around the anus).  Internal Hemorrhoids  Internal hemorrhoids are often painless, but they rarely cause bleeding.  The internal veins may stretch and fall down (prolapse) through the anus to the outside of the body.  The veins may then become irritated and painful.  External Hemorrhoids  External hemorrhoids can be  easily seen or felt around the anal opening.  They are under the skin around the anus.  When the swollen veins are scratched or broken by straining, rubbing or wiping they sometimes bleed.  How Hemorrhoids Occur  Veins in the rectum and around the anus tend to swell under pressure.  Hemorrhoids can result from increased pressure in the veins of your anus or rectum.  Some sources of pressure are:  Straining to have a bowel movement because of constipation Waiting too long to have a bowel movement Coughing and sneezing often Sitting for extended periods of time, including on the toilet Diarrhea Obesity Trauma or injury to the anus Some liver diseases Stress Family history of hemorrhoids Pregnancy  Pregnant women should try to avoid becoming constipated, because they are more likely to have hemorrhoids during pregnancy.  In the last trimester of pregnancy, the enlarged uterus may press on blood vessels and causes hemorrhoids.  In addition, the strain of childbirth sometimes causes hemorrhoids after the birth.  Symptoms of Hemorrhoids  Some symptoms of hemorrhoids include: Swelling and/or a tender lump around the anus Itching, mild burning and bleeding around the anus Painful bowel movements with or without constipation Bright red blood covering the stool, on toilet paper or in the toilet bowel.   Symptoms usually go away within a few days.  Always talk to your doctor about any bleeding to make sure it is not from some other causes.  Diagnosing and Treating Hemorrhoids  Diagnosis is made by an examination by your healthcare provider.  Special test can be performed by your doctor.    Most cases of hemorrhoids can be treated with: High-fiber diet: Eat more high-fiber foods, which help prevent constipation.  Ask for more detailed fiber information on types and sources of fiber from your healthcare provider. Fluids: Drink plenty of water.  This helps soften bowel movements so they are  easier to pass. Sitz baths and cold packs: Sitting in lukewarm water two or three times a day for 15 minutes cleases the anal area and may relieve discomfort.  If the water is too hot, swelling around the anus will get worse.  Placing a cloth-covered ice pack on the anus for ten minutes four times a day can also help reduce selling.  Gently pushing a prolapsed hemorrhoid back inside after the bath or ice pack can be helpful. Medications: For mild discomfort, your healthcare provider may suggest over-the-counter pain medication or prescribe a cream or ointment for topical use.  The cream may contain witch hazel, zinc oxide or petroleum jelly.  Medicated suppositories are also a treatment option.  Always consult your doctor before applying medications or creams. Procedures and surgeries: There are also a number of procedures and surgeries to shrink or remove hemorrhoids in more serious cases.  Talk to your physician about these options.  You can often prevent hemorrhoids or keep them from becoming worse by maintaining a healthy lifestyle.  Eat  a fiber-rich diet of fruits, vegetables and whole grains.  Also, drink plenty of water and exercise regularly.   2007, [employer] Doc.30

## 2021-07-03 ENCOUNTER — Other Ambulatory Visit: Payer: Self-pay

## 2021-07-03 DIAGNOSIS — K625 Hemorrhage of anus and rectum: Secondary | ICD-10-CM

## 2021-07-03 MED ORDER — PREPARATION H 0.25-88.44 % RE SUPP: RECTAL | 0 refills | Status: DC

## 2021-07-03 NOTE — Telephone Encounter (Signed)
Another cheaper alternative we advise pt's to consider is a small amount of anusol hc cream purchased OTC applied to the tip of a preparation h suppository purchased OTC. Would you prefer this alternative?

## 2021-07-03 NOTE — Telephone Encounter (Signed)
Please call niece and see if we can send hydrocortisone suppositories to [facility] or [facility] and use [facility], can be 30 -40 dollars depending on where, if still expensive can just send in external cream.

## 2021-07-03 NOTE — Telephone Encounter (Signed)
Samantha Bishop,Samantha Bishop [phone] not listed on DPR. Therefore, cannot return call. Called pt and informed her of affordable OTC alternative to Anusol supp and Rx instructions. Pt expressed appreciation for my call.

## 2021-07-10 ENCOUNTER — Encounter: Payer: Self-pay | Admitting: Internal Medicine

## 2021-07-10 ENCOUNTER — Other Ambulatory Visit: Payer: Self-pay

## 2021-07-10 ENCOUNTER — Ambulatory Visit (INDEPENDENT_AMBULATORY_CARE_PROVIDER_SITE_OTHER): Payer: Medicare Other | Admitting: Internal Medicine

## 2021-07-10 VITALS — BP 126/74 | HR 84 | Resp 18 | Ht 62.5 in | Wt 172.8 lb

## 2021-07-10 DIAGNOSIS — E782 Mixed hyperlipidemia: Secondary | ICD-10-CM

## 2021-07-10 DIAGNOSIS — R300 Dysuria: Secondary | ICD-10-CM | POA: Diagnosis not present

## 2021-07-10 DIAGNOSIS — I10 Essential (primary) hypertension: Secondary | ICD-10-CM | POA: Diagnosis not present

## 2021-07-10 LAB — URINALYSIS, ROUTINE W REFLEX MICROSCOPIC
Bilirubin Urine: NEGATIVE
Ketones, ur: NEGATIVE
Nitrite: POSITIVE — AB
Specific Gravity, Urine: 1.01 (ref 1.000–1.030)
Total Protein, Urine: NEGATIVE
Urine Glucose: NEGATIVE
Urobilinogen, UA: 0.2 (ref 0.0–1.0)
pH: 7 (ref 5.0–8.0)

## 2021-07-10 NOTE — Assessment & Plan Note (Signed)
BP at goal on hctz 12.5 mg daily and advised BMP today however she declines a desire for ongoing labs at this time. Will continue unchanged for now. ?

## 2021-07-10 NOTE — Assessment & Plan Note (Signed)
Checking U/A and treat as appropriate.  ?

## 2021-07-10 NOTE — Patient Instructions (Addendum)
We do not need labs today. ? ?It is okay to try the fiber if you want and if you get gas or bloating more so you can stop this. ?

## 2021-07-10 NOTE — Progress Notes (Signed)
? ?  Subjective:  ? ?Patient ID: Samantha Bishop, female    DOB: 1924-09-08, 88 y.o.   MRN: [id number] ? ?HPI ?The patient is here for follow up/concerns ? ?Review of Systems  ?Constitutional: Negative.   ?HENT: Negative.    ?Eyes: Negative.   ?Respiratory:  Negative for cough, chest tightness and shortness of breath.   ?Cardiovascular:  Negative for chest pain, palpitations and leg swelling.  ?Gastrointestinal:  Negative for abdominal distention, abdominal pain, constipation, diarrhea, nausea and vomiting.  ?Genitourinary:  Positive for frequency and urgency.  ?Musculoskeletal: Negative.   ?Skin: Negative.   ?Neurological: Negative.   ?Psychiatric/Behavioral: Negative.    ? ?Objective:  ?Physical Exam ?Constitutional:   ?   Appearance: She is well-developed.  ?HENT:  ?   Head: Normocephalic and atraumatic.  ?Cardiovascular:  ?   Rate and Rhythm: Normal rate and regular rhythm.  ?Pulmonary:  ?   Effort: Pulmonary effort is normal. No respiratory distress.  ?   Breath sounds: Normal breath sounds. No wheezing or rales.  ?Abdominal:  ?   General: Bowel sounds are normal. There is no distension.  ?   Palpations: Abdomen is soft.  ?   Tenderness: There is no abdominal tenderness. There is no rebound.  ?Musculoskeletal:  ?   Cervical back: Normal range of motion.  ?Skin: ?   General: Skin is warm and dry.  ?Neurological:  ?   Mental Status: She is alert and oriented to person, place, and time.  ?   Coordination: Coordination normal.  ? ? ?Vitals:  ? 07/10/21 1523  ?BP: 126/74  ?Pulse: 84  ?Resp: 18  ?SpO2: 96%  ?Weight: 172 lb 12.8 oz (78.4 kg)  ?Height: 5' 2.5" (1.588 m)  ? ? ?This visit occurred during the SARS-CoV-2 public health emergency.  Safety protocols were in place, including screening questions prior to the visit, additional usage of staff PPE, and extensive cleaning of exam room while observing appropriate contact time as indicated for disinfecting solutions.  ? ?Assessment & Plan:  ? ? ?

## 2021-07-10 NOTE — Assessment & Plan Note (Signed)
Taking pravastatin 20 mg daily and she is offered labs today for lipid panel and she declines wanting ongoing labs at this time. Will maintain pravastatin 20 mg daily.  ?

## 2021-07-14 ENCOUNTER — Other Ambulatory Visit: Payer: Self-pay | Admitting: Internal Medicine

## 2021-07-14 MED ORDER — NITROFURANTOIN MONOHYD MACRO 100 MG PO CAPS: 100.0000 mg | ORAL_CAPSULE | Freq: Two times a day (BID) | ORAL | 0 refills | Status: DC

## 2021-08-06 ENCOUNTER — Telehealth: Payer: Self-pay | Admitting: Internal Medicine

## 2021-08-06 DIAGNOSIS — R8290 Unspecified abnormal findings in urine: Secondary | ICD-10-CM

## 2021-08-06 NOTE — Telephone Encounter (Signed)
Pt states she took nitrofurantoin, macrocrystal-monohydrate, (MACROBID) 100 MG capsule as prescribe and her urine still "smells awful" ? ?Pt denied any other symptom ? ?Pt inquiring if a different rx can be sent to: ? ?[place], [place] [facility] [address] ? ?

## 2021-08-06 NOTE — Telephone Encounter (Signed)
Spoke with the pt and she is only complaining of her urine having a odor. Denies having pressure or pain with urination. She finished her antibiotic.  ?

## 2021-08-07 NOTE — Telephone Encounter (Signed)
Placed orders for urine to check for infection she can do anytime or have family drop off sample. ?

## 2021-08-25 ENCOUNTER — Telehealth: Payer: Self-pay | Admitting: Internal Medicine

## 2021-08-25 DIAGNOSIS — H9193 Unspecified hearing loss, bilateral: Secondary | ICD-10-CM

## 2021-08-25 NOTE — Telephone Encounter (Signed)
Pts daughter Horris Latino requesting a referral for a hearing test sent to: [facility], [address], [place], [place] [address] ? ?Inquired if provider has seen pt for hearing in the last year, caller stated yes ? ?Caller states pt has an appt tomorrow 4-19 to have hearing test, but needs a referral ? ?Advised caller referral process may take longer than 24 hrs depending upon insurance ? ?Caller states she will pay out of pocket for appt  ? ?Please advise ?

## 2021-08-25 NOTE — Telephone Encounter (Signed)
Referral placed.

## 2021-08-26 DIAGNOSIS — H903 Sensorineural hearing loss, bilateral: Secondary | ICD-10-CM | POA: Diagnosis not present

## 2021-09-07 ENCOUNTER — Other Ambulatory Visit: Payer: Self-pay | Admitting: Internal Medicine

## 2021-09-29 ENCOUNTER — Telehealth: Payer: Self-pay | Admitting: Internal Medicine

## 2021-09-29 NOTE — Telephone Encounter (Signed)
1.Medication Requested: gabapentin (NEURONTIN) 300 MG capsule apixaban (ELIQUIS) 5 MG TABS tablet hydrochlorothiazide (MICROZIDE) 12.5 MG capsule pravastatin (PRAVACHOL) 20 MG tablet   2. Pharmacy (Name, Street, [facility]): [facility] [place], [place] - [address] Phone:  [phone]  Fax:  [phone]     3. On Med List: yes  4. Last Visit with PCP:  5. Next visit date with PCP:   Agent: Please be advised that RX refills may take up to 3 business days. We ask that you follow-up with your pharmacy.

## 2021-09-30 MED ORDER — GABAPENTIN 300 MG PO CAPS: 600.0000 mg | ORAL_CAPSULE | Freq: Three times a day (TID) | ORAL | 3 refills | Status: DC

## 2021-09-30 MED ORDER — APIXABAN 5 MG PO TABS: ORAL_TABLET | ORAL | 3 refills | Status: DC

## 2021-09-30 NOTE — Telephone Encounter (Signed)
Refills have been sent to the pt's pharmacy  

## 2022-01-05 ENCOUNTER — Ambulatory Visit: Payer: Medicare Other | Admitting: Internal Medicine

## 2022-01-07 ENCOUNTER — Ambulatory Visit (INDEPENDENT_AMBULATORY_CARE_PROVIDER_SITE_OTHER): Payer: Medicare Other | Admitting: Internal Medicine

## 2022-01-07 ENCOUNTER — Encounter: Payer: Self-pay | Admitting: Internal Medicine

## 2022-01-07 VITALS — BP 120/70 | HR 80 | Temp 98.6°F | Ht 62.5 in | Wt 173.0 lb

## 2022-01-07 DIAGNOSIS — Z8673 Personal history of transient ischemic attack (TIA), and cerebral infarction without residual deficits: Secondary | ICD-10-CM

## 2022-01-07 DIAGNOSIS — I480 Paroxysmal atrial fibrillation: Secondary | ICD-10-CM | POA: Diagnosis not present

## 2022-01-07 DIAGNOSIS — I10 Essential (primary) hypertension: Secondary | ICD-10-CM | POA: Diagnosis not present

## 2022-01-07 DIAGNOSIS — N959 Unspecified menopausal and perimenopausal disorder: Secondary | ICD-10-CM | POA: Insufficient documentation

## 2022-01-07 DIAGNOSIS — E782 Mixed hyperlipidemia: Secondary | ICD-10-CM | POA: Diagnosis not present

## 2022-01-07 DIAGNOSIS — S12100A Unspecified displaced fracture of second cervical vertebra, initial encounter for closed fracture: Secondary | ICD-10-CM | POA: Insufficient documentation

## 2022-01-07 LAB — LIPID PANEL
Cholesterol: 136 mg/dL (ref 0–200)
HDL: 54.7 mg/dL (ref >39.00)
LDL Cholesterol: 65 mg/dL (ref 0–99)
NonHDL: 81.5
Total CHOL/HDL Ratio: 2
Triglycerides: 83 mg/dL (ref 0.0–149.0)
VLDL: 16.6 mg/dL (ref 0.0–40.0)

## 2022-01-07 LAB — COMPREHENSIVE METABOLIC PANEL
ALT: 10 U/L (ref 0–35)
AST: 15 U/L (ref 0–37)
Albumin: 4.3 g/dL (ref 3.5–5.2)
Alkaline Phosphatase: 59 U/L (ref 39–117)
BUN: 15 mg/dL (ref 6–23)
CO2: 31 mEq/L (ref 19–32)
Calcium: 9.9 mg/dL (ref 8.4–10.5)
Chloride: 101 mEq/L (ref 96–112)
Creatinine, Ser: 0.63 mg/dL (ref 0.40–1.20)
GFR: 75.74 mL/min (ref >60.00)
Glucose, Bld: 95 mg/dL (ref 70–99)
Potassium: 4 mEq/L (ref 3.5–5.1)
Sodium: 138 mEq/L (ref 135–145)
Total Bilirubin: 0.5 mg/dL (ref 0.2–1.2)
Total Protein: 7.6 g/dL (ref 6.0–8.3)

## 2022-01-07 LAB — CBC
HCT: 35.3 % — ABNORMAL LOW (ref 36.0–46.0)
Hemoglobin: 11.6 g/dL — ABNORMAL LOW (ref 12.0–15.0)
MCHC: 32.8 g/dL (ref 30.0–36.0)
MCV: 87.4 fl (ref 78.0–100.0)
Platelets: 222 10*3/uL (ref 150.0–400.0)
RBC: 4.04 Mil/uL (ref 3.87–5.11)
RDW: 14.4 % (ref 11.5–15.5)
WBC: 7.1 10*3/uL (ref 4.0–10.5)

## 2022-01-07 MED ORDER — PRAVASTATIN SODIUM 20 MG PO TABS: ORAL_TABLET | ORAL | 3 refills | Status: DC

## 2022-01-07 MED ORDER — HYDROCHLOROTHIAZIDE 12.5 MG PO CAPS: ORAL_CAPSULE | ORAL | 3 refills | Status: DC

## 2022-01-07 MED ORDER — GABAPENTIN 300 MG PO CAPS: 600.0000 mg | ORAL_CAPSULE | Freq: Three times a day (TID) | ORAL | 3 refills | Status: DC

## 2022-01-07 MED ORDER — APIXABAN 5 MG PO TABS: ORAL_TABLET | ORAL | 3 refills | Status: DC

## 2022-01-07 NOTE — Progress Notes (Signed)
   Subjective:   Patient ID: Samantha Bishop, female    DOB: August 26, 1924, 88 y.o.   MRN: [id number]  HPI The patient is a 88 YO female coming in for follow up   Review of Systems  Constitutional: Negative.   HENT: Negative.    Eyes: Negative.   Respiratory:  Negative for cough, chest tightness and shortness of breath.   Cardiovascular:  Negative for chest pain, palpitations and leg swelling.  Gastrointestinal:  Negative for abdominal distention, abdominal pain, constipation, diarrhea, nausea and vomiting.  Musculoskeletal:  Positive for arthralgias.  Skin: Negative.   Neurological: Negative.   Psychiatric/Behavioral: Negative.      Objective:  Physical Exam Constitutional:      Appearance: She is well-developed.  HENT:     Head: Normocephalic and atraumatic.  Cardiovascular:     Rate and Rhythm: Normal rate and regular rhythm.  Pulmonary:     Effort: Pulmonary effort is normal. No respiratory distress.     Breath sounds: Normal breath sounds. No wheezing or rales.  Abdominal:     General: Bowel sounds are normal. There is no distension.     Palpations: Abdomen is soft.     Tenderness: There is no abdominal tenderness. There is no rebound.  Musculoskeletal:        General: Tenderness present.     Cervical back: Normal range of motion.  Skin:    General: Skin is warm and dry.  Neurological:     Mental Status: She is alert and oriented to person, place, and time.     Coordination: Coordination normal.     Vitals:   01/07/22 1152  BP: 120/70  Pulse: 80  Temp: 98.6 F (37 C)  TempSrc: Oral  SpO2: 94%  Weight: 173 lb (78.5 kg)  Height: 5' 2.5" (1.588 m)    Assessment & Plan:

## 2022-01-07 NOTE — Patient Instructions (Signed)
We will check the labs and refill the medicines.

## 2022-01-08 NOTE — Assessment & Plan Note (Signed)
No new symptoms. Checking lipid panel and BP at goal. No DM.

## 2022-01-08 NOTE — Assessment & Plan Note (Signed)
Checking CMP and lipid panel and adjust hctz 12.5 mg daily.

## 2022-01-08 NOTE — Assessment & Plan Note (Signed)
Checking lipid panel and adjust pravastatin 20 mg daily as needed. 

## 2022-01-08 NOTE — Assessment & Plan Note (Signed)
Checking CBC and CMP. Taking eliquis and no signs of bleeding.

## 2022-02-15 DIAGNOSIS — Z23 Encounter for immunization: Secondary | ICD-10-CM | POA: Diagnosis not present

## 2022-03-08 ENCOUNTER — Telehealth: Payer: Self-pay | Admitting: Internal Medicine

## 2022-03-08 ENCOUNTER — Other Ambulatory Visit: Payer: Self-pay | Admitting: *Deleted

## 2022-03-08 MED ORDER — GABAPENTIN 300 MG PO CAPS: 600.0000 mg | ORAL_CAPSULE | Freq: Three times a day (TID) | ORAL | 3 refills | Status: DC

## 2022-03-08 MED ORDER — APIXABAN 5 MG PO TABS
ORAL_TABLET | ORAL | 3 refills | Status: DC
Start: 2022-03-08 — End: 2022-08-10

## 2022-03-08 MED ORDER — HYDROCHLOROTHIAZIDE 12.5 MG PO CAPS: ORAL_CAPSULE | ORAL | 3 refills | Status: DC

## 2022-03-08 MED ORDER — PRAVASTATIN SODIUM 20 MG PO TABS: ORAL_TABLET | ORAL | 3 refills | Status: DC

## 2022-03-08 NOTE — Telephone Encounter (Signed)
Patient needs refills on her medications:  Eliquis, Gabapentin, prevastatin, hydrochlorithizide, escitalopram.  Please send medicaitons to [facility] on [address] in [place]

## 2022-03-08 NOTE — Telephone Encounter (Signed)
escitalopram (LEXAPRO) 5 MG tablet--please advise

## 2022-03-08 NOTE — Telephone Encounter (Signed)
Okay to refill? 

## 2022-03-10 ENCOUNTER — Other Ambulatory Visit: Payer: Self-pay

## 2022-03-10 DIAGNOSIS — F4323 Adjustment disorder with mixed anxiety and depressed mood: Secondary | ICD-10-CM

## 2022-03-10 MED ORDER — ESCITALOPRAM OXALATE 5 MG PO TABS: 5.0000 mg | ORAL_TABLET | Freq: Every day | ORAL | 3 refills | Status: DC

## 2022-03-10 NOTE — Telephone Encounter (Signed)
Refilled for pt

## 2022-03-13 ENCOUNTER — Emergency Department (HOSPITAL_COMMUNITY): Payer: Medicare Other

## 2022-03-13 ENCOUNTER — Inpatient Hospital Stay (HOSPITAL_COMMUNITY)
Admission: EM | Admit: 2022-03-13 | Discharge: 2022-03-15 | DRG: 690 | Disposition: A | Discharge status: Home Health | Payer: Medicare Other | Attending: Internal Medicine | Admitting: Internal Medicine

## 2022-03-13 ENCOUNTER — Encounter (HOSPITAL_COMMUNITY): Payer: Self-pay

## 2022-03-13 DIAGNOSIS — Z8249 Family history of ischemic heart disease and other diseases of the circulatory system: Secondary | ICD-10-CM

## 2022-03-13 DIAGNOSIS — I4891 Unspecified atrial fibrillation: Secondary | ICD-10-CM | POA: Diagnosis present

## 2022-03-13 DIAGNOSIS — B961 Klebsiella pneumoniae [K. pneumoniae] as the cause of diseases classified elsewhere: Secondary | ICD-10-CM | POA: Diagnosis present

## 2022-03-13 DIAGNOSIS — Z86711 Personal history of pulmonary embolism: Secondary | ICD-10-CM

## 2022-03-13 DIAGNOSIS — Z96652 Presence of left artificial knee joint: Secondary | ICD-10-CM | POA: Diagnosis present

## 2022-03-13 DIAGNOSIS — I10 Essential (primary) hypertension: Secondary | ICD-10-CM | POA: Diagnosis present

## 2022-03-13 DIAGNOSIS — I7789 Other specified disorders of arteries and arterioles: Secondary | ICD-10-CM | POA: Diagnosis not present

## 2022-03-13 DIAGNOSIS — Z880 Allergy status to penicillin: Secondary | ICD-10-CM

## 2022-03-13 DIAGNOSIS — Z7901 Long term (current) use of anticoagulants: Secondary | ICD-10-CM

## 2022-03-13 DIAGNOSIS — N390 Urinary tract infection, site not specified: Principal | ICD-10-CM | POA: Diagnosis present

## 2022-03-13 DIAGNOSIS — E876 Hypokalemia: Secondary | ICD-10-CM | POA: Diagnosis present

## 2022-03-13 DIAGNOSIS — Z823 Family history of stroke: Secondary | ICD-10-CM | POA: Diagnosis not present

## 2022-03-13 DIAGNOSIS — I3139 Other pericardial effusion (noninflammatory): Secondary | ICD-10-CM | POA: Diagnosis not present

## 2022-03-13 DIAGNOSIS — R54 Age-related physical debility: Secondary | ICD-10-CM | POA: Diagnosis present

## 2022-03-13 DIAGNOSIS — R404 Transient alteration of awareness: Secondary | ICD-10-CM | POA: Diagnosis not present

## 2022-03-13 DIAGNOSIS — F419 Anxiety disorder, unspecified: Secondary | ICD-10-CM | POA: Diagnosis present

## 2022-03-13 DIAGNOSIS — Z7401 Bed confinement status: Secondary | ICD-10-CM | POA: Diagnosis not present

## 2022-03-13 DIAGNOSIS — K219 Gastro-esophageal reflux disease without esophagitis: Secondary | ICD-10-CM | POA: Diagnosis present

## 2022-03-13 DIAGNOSIS — Z885 Allergy status to narcotic agent status: Secondary | ICD-10-CM

## 2022-03-13 DIAGNOSIS — R109 Unspecified abdominal pain: Secondary | ICD-10-CM | POA: Diagnosis not present

## 2022-03-13 DIAGNOSIS — K5730 Diverticulosis of large intestine without perforation or abscess without bleeding: Secondary | ICD-10-CM | POA: Diagnosis not present

## 2022-03-13 DIAGNOSIS — Z79899 Other long term (current) drug therapy: Secondary | ICD-10-CM

## 2022-03-13 DIAGNOSIS — R61 Generalized hyperhidrosis: Secondary | ICD-10-CM | POA: Diagnosis not present

## 2022-03-13 DIAGNOSIS — Z1152 Encounter for screening for COVID-19: Secondary | ICD-10-CM

## 2022-03-13 DIAGNOSIS — N200 Calculus of kidney: Secondary | ICD-10-CM | POA: Diagnosis not present

## 2022-03-13 DIAGNOSIS — J40 Bronchitis, not specified as acute or chronic: Secondary | ICD-10-CM | POA: Diagnosis present

## 2022-03-13 DIAGNOSIS — R42 Dizziness and giddiness: Secondary | ICD-10-CM | POA: Diagnosis not present

## 2022-03-13 DIAGNOSIS — R6889 Other general symptoms and signs: Secondary | ICD-10-CM | POA: Diagnosis not present

## 2022-03-13 DIAGNOSIS — I499 Cardiac arrhythmia, unspecified: Secondary | ICD-10-CM | POA: Diagnosis not present

## 2022-03-13 DIAGNOSIS — Z85828 Personal history of other malignant neoplasm of skin: Secondary | ICD-10-CM

## 2022-03-13 DIAGNOSIS — Z743 Need for continuous supervision: Secondary | ICD-10-CM | POA: Diagnosis not present

## 2022-03-13 DIAGNOSIS — I4821 Permanent atrial fibrillation: Secondary | ICD-10-CM | POA: Diagnosis present

## 2022-03-13 DIAGNOSIS — E785 Hyperlipidemia, unspecified: Secondary | ICD-10-CM | POA: Diagnosis present

## 2022-03-13 LAB — CBC WITH DIFFERENTIAL/PLATELET
Abs Immature Granulocytes: 0.03 10*3/uL (ref 0.00–0.07)
Basophils Absolute: 0.1 10*3/uL (ref 0.0–0.1)
Basophils Relative: 1 %
Eosinophils Absolute: 0 10*3/uL (ref 0.0–0.5)
Eosinophils Relative: 0 %
HCT: 34.2 % — ABNORMAL LOW (ref 36.0–46.0)
Hemoglobin: 11.3 g/dL — ABNORMAL LOW (ref 12.0–15.0)
Immature Granulocytes: 0 %
Lymphocytes Relative: 13 %
Lymphs Abs: 1.7 10*3/uL (ref 0.7–4.0)
MCH: 29 pg (ref 26.0–34.0)
MCHC: 33 g/dL (ref 30.0–36.0)
MCV: 87.7 fL (ref 80.0–100.0)
Monocytes Absolute: 1.4 10*3/uL — ABNORMAL HIGH (ref 0.1–1.0)
Monocytes Relative: 11 %
Neutro Abs: 9.9 10*3/uL — ABNORMAL HIGH (ref 1.7–7.7)
Neutrophils Relative %: 75 %
Platelets: 190 10*3/uL (ref 150–400)
RBC: 3.9 MIL/uL (ref 3.87–5.11)
RDW: 14.2 % (ref 11.5–15.5)
WBC: 13.1 10*3/uL — ABNORMAL HIGH (ref 4.0–10.5)
nRBC: 0 % (ref 0.0–0.2)

## 2022-03-13 LAB — COMPREHENSIVE METABOLIC PANEL
ALT: 12 U/L (ref 0–44)
AST: 22 U/L (ref 15–41)
Albumin: 3.9 g/dL (ref 3.5–5.0)
Alkaline Phosphatase: 52 U/L (ref 38–126)
Anion gap: 11 (ref 5–15)
BUN: 16 mg/dL (ref 8–23)
CO2: 25 mmol/L (ref 22–32)
Calcium: 9.3 mg/dL (ref 8.9–10.3)
Chloride: 100 mmol/L (ref 98–111)
Creatinine, Ser: 0.76 mg/dL (ref 0.44–1.00)
GFR, Estimated: >60 mL/min (ref >60)
Glucose, Bld: 125 mg/dL — ABNORMAL HIGH (ref 70–99)
Potassium: 3.2 mmol/L — ABNORMAL LOW (ref 3.5–5.1)
Sodium: 136 mmol/L (ref 135–145)
Total Bilirubin: 1.5 mg/dL — ABNORMAL HIGH (ref 0.3–1.2)
Total Protein: 6.9 g/dL (ref 6.5–8.1)

## 2022-03-13 LAB — TROPONIN I (HIGH SENSITIVITY)
Troponin I (High Sensitivity): 27 ng/L — ABNORMAL HIGH (ref <18)
Troponin I (High Sensitivity): 19 ng/L — ABNORMAL HIGH (ref <18)

## 2022-03-13 LAB — TSH: TSH: 0.692 u[IU]/mL (ref 0.350–4.500)

## 2022-03-13 LAB — BRAIN NATRIURETIC PEPTIDE: B Natriuretic Peptide: 254 pg/mL — ABNORMAL HIGH (ref 0.0–100.0)

## 2022-03-13 MED ORDER — LACTATED RINGERS IV BOLUS
1000.0000 mL | Freq: Once | INTRAVENOUS | Status: AC
  Administered 2022-03-13: 1000 mL via INTRAVENOUS

## 2022-03-13 MED ORDER — IOHEXOL 350 MG/ML SOLN
100.0000 mL | Freq: Once | INTRAVENOUS | Status: AC | PRN
  Administered 2022-03-13: 100 mL via INTRAVENOUS

## 2022-03-13 NOTE — H&P (Signed)
History and Physical    Patient: Samantha Bishop ION:[id number] DOB: [birth year] DOA: 03/13/2022 DOS: the patient was seen and examined on 03/13/2022 PCP: Hoyt Koch, MD  Patient coming from: {Point_of_Origin:[address]}  Chief Complaint: No chief complaint on file.  HPI: Samantha Bishop is a 88 y.o. female with medical history significant of ***  Review of Systems: {ROS_Text:26778} Past Medical History:  Diagnosis Date   Anxiety state, unspecified    Calculus of kidney    Cerebral aneurysm, nonruptured    Colon polyp    Complication of anesthesia    "my heart stopped" 2004 knee  surg - see note on chart   DEEP VENOUS THROMBOPHLEBITIS 08/15/2007   Qualifier: History of  By: Lenna Gilford MD, Deborra Medina    Diverticulosis of colon (without mention of hemorrhage)    History of skin cancer    HTN (hypertension)    Hyperlipidemia    Lumbago    Osteoarthrosis, unspecified whether generalized or localized, unspecified site    Permanent atrial fibrillation ([place])    Pulmonary emboli ([place])    following remote knee arthroscopy surgery   Ruptured lumbar disc    Sleep apnea    stop bang score 4    Unspecified hemorrhoids without mention of complication    Past Surgical History:  Procedure Laterality Date   ABDOMINAL HYSTERECTOMY     APPENDECTOMY     BLOCKED INTESTINE SURGERY     BREAST CYST EXCISION     ESOPHAGOGASTRODUODENOSCOPY (EGD) WITH PROPOFOL N/A 05/30/2019   Procedure: ESOPHAGOGASTRODUODENOSCOPY (EGD) WITH PROPOFOL;  Surgeon: Thornton Park, MD;  Location: [place];  Service: Gastroenterology;  Laterality: N/A;   HEMHORROIDECTOMY     JOINT REPLACEMENT  2004   RT TOTAL KNEE   KNEE ARTHROSCOPY     X2 L KNEE   KNEE SURGERY     NASAL SURGERY X2     TONSILLECTOMY AND ADENOIDECTOMY     TOTAL KNEE ARTHROPLASTY  09/03/2011   Procedure: TOTAL KNEE ARTHROPLASTY;  Surgeon: Gearlean Alf, MD;  Location: [facility];  Service: Orthopedics;  Laterality: Left;   Social History:  reports  that she has never smoked. She has never used smokeless tobacco. She reports that she does not currently use alcohol. She reports that she does not use drugs.  Allergies  Allergen Reactions   Codeine Nausea Only   Penicillins Rash    Family History  Problem Relation Age of Onset   Coronary artery disease Father    Stroke Father    Cirrhosis Brother    Colon polyps Neg Hx    Kidney disease Neg Hx    Diabetes Neg Hx     Prior to Admission medications   Medication Sig Start Date End Date Taking? Authorizing Provider  apixaban (ELIQUIS) 5 MG TABS tablet TAKE 1 TABLET (5 MG TOTAL) BY MOUTH 2 (TWO) TIMES DAILY. 03/08/22  Yes Hoyt Koch, MD  cholecalciferol (VITAMIN D) 25 MCG tablet Take 2 tablets (2,000 Units total) by mouth daily. 06/25/20   Barb Merino, MD  escitalopram (LEXAPRO) 5 MG tablet Take 1 tablet (5 mg total) by mouth daily. 03/10/22   Hoyt Koch, MD  fluticasone (FLONASE) 50 MCG/ACT nasal spray Place 2 sprays into both nostrils daily. Patient taking differently: Place 2 sprays into both nostrils 2 (two) times daily. 06/29/19   Granville Lewis C, PA-C  gabapentin (NEURONTIN) 300 MG capsule Take 2 capsules (600 mg total) by mouth 3 (three) times daily. 03/08/22   Pricilla Holm  A, MD  hydrochlorothiazide (MICROZIDE) 12.5 MG capsule TAKE 1 CAPSULE(12.5 MG) BY MOUTH DAILY 03/08/22   Hoyt Koch, MD  Multiple Vitamin (MULTIVITAMIN WITH MINERALS) TABS tablet Take 1 tablet by mouth daily.    [provider]  nitrofurantoin, macrocrystal-monohydrate, (MACROBID) 100 MG capsule Take 1 capsule (100 mg total) by mouth 2 (two) times daily. 07/14/21   Hoyt Koch, MD  NON FORMULARY Diet order: Regular Diet.    [provider]  polyethylene glycol (MIRALAX / GLYCOLAX) 17 g packet Take 17 g by mouth daily as needed for moderate constipation. 06/29/20   Barb Merino, MD  pravastatin (PRAVACHOL) 20 MG tablet TAKE 1 TABLET(20 MG) BY MOUTH  DAILY AT 6 PM 03/08/22   Hoyt Koch, MD  shark liver oil-cocoa butter (PREPARATION H) 0.25-88.44 % suppository Apply pea size amt of OTC anusol HC cream to tip of supp and insert rectally as needed 07/03/21   Vladimir Crofts, PA-C  sodium chloride (OCEAN) 0.65 % SOLN nasal spray Place 1 spray into both nostrils as needed for congestion. Patient taking differently: Place 1 spray into both nostrils 2 (two) times daily as needed for congestion. 07/24/19   Hoyt Koch, MD    Physical Exam: Vitals:   03/13/22 2215 03/13/22 2216  BP: (!) 165/68   Pulse: 78   Resp: 15   Temp:  98.3 F (36.8 C)  SpO2: 93%    *** Data Reviewed: {Tip this will not be part of the note when signed- Document your independent interpretation of telemetry tracing, EKG, lab, Radiology test or any other diagnostic tests. Add any new diagnostic test ordered today. (Optional):26781} {Results:26384}  Assessment and Plan: No notes have been filed under this hospital service. Service: Hospitalist     Advance Care Planning:   Code Status: Prior ***  Consults: ***  Family Communication: ***  Severity of Illness: {Observation/Inpatient:21159}  AuthorBarbette Merino, MD 03/13/2022 11:19 PM  For on call review [email or web address].

## 2022-03-13 NOTE — ED Provider Notes (Signed)
[facility] Provider Note   CSN: [id number] Arrival date & time:        History No chief complaint on file.   HPI Samantha Bishop is a 88 y.o. female presenting for chief complaint of diaphoresis.  She endorses that she has been sweaty throughout the day today.  She endorses an episode of severe abdominal pain yesterday that is now completely resolved.  She is currently asymptomatic.  She denies fevers or chills, nausea vomiting compassing shortness of breath.  She otherwise ambulatory tolerating p.o. intake.  She lives at a skilled nursing facility.  However she has had a significant mount of sweating especially around her hairline that has caused significant distress.  She cannot think of any medication changes but acknowledges she is on multiple medications at this time.    Patient's recorded medical, surgical, social, medication list and allergies were reviewed in the Snapshot window as part of the initial history.   Review of Systems   Review of Systems  Constitutional:  Positive for diaphoresis. Negative for chills and fever.  HENT:  Negative for ear pain and sore throat.   Eyes:  Negative for pain and visual disturbance.  Respiratory:  Negative for cough and shortness of breath.   Cardiovascular:  Negative for chest pain and palpitations.  Gastrointestinal:  Negative for abdominal pain and vomiting.  Genitourinary:  Negative for dysuria and hematuria.  Musculoskeletal:  Negative for arthralgias and back pain.  Skin:  Negative for color change and rash.  Neurological:  Negative for seizures and syncope.  All other systems reviewed and are negative.   Physical Exam Updated Vital Signs BP (!) 165/68   Pulse 78   Temp 98.3 F (36.8 C)   Resp 15   SpO2 93%  Physical Exam Vitals and nursing note reviewed.  Constitutional:      General: She is not in acute distress.    Appearance: She is well-developed.  HENT:     Head:  Normocephalic and atraumatic.  Eyes:     Conjunctiva/sclera: Conjunctivae normal.  Cardiovascular:     Rate and Rhythm: Normal rate and regular rhythm.     Heart sounds: No murmur heard. Pulmonary:     Effort: Pulmonary effort is normal. No respiratory distress.     Breath sounds: Normal breath sounds.  Abdominal:     Palpations: Abdomen is soft.     Tenderness: There is no abdominal tenderness.  Musculoskeletal:        General: No swelling.     Cervical back: Neck supple.  Skin:    General: Skin is warm and dry.     Capillary Refill: Capillary refill takes less than 2 seconds.  Neurological:     Mental Status: She is alert.  Psychiatric:        Mood and Affect: Mood normal.      ED Course/ Medical Decision Making/ A&P    Procedures Procedures   Medications Ordered in ED Medications  lactated ringers bolus 1,000 mL (1,000 mLs Intravenous New Bag/Given 03/13/22 2214)  iohexol (OMNIPAQUE) 350 MG/ML injection 100 mL (100 mLs Intravenous Contrast Given 03/13/22 2156)    Medical Decision Making:    Samantha Bishop is a 88 y.o. female who presented to the ED today with diaphoresis and episode of abdominal pain yesterday detailed above.     Patient's presentation is complicated by their history of advanced age, atrial fibrillation on anticoagulation, multiple comorbid medical problems.  Patient placed on  continuous vitals and telemetry monitoring while in ED which was reviewed periodically.   Complete initial physical exam performed, notably the patient  was hemodynamically stable in no acute distress.  She is notably sweaty.  She has palpable pulses bilaterally.  She has no acute abdominal pain..      Reviewed and confirmed nursing documentation for past medical history, family history, social history.    Initial Assessment:   With the patient's presentation of diaphoresis , most likely diagnosis is nonspecific etiology.  May be endocrinologic, related to thyroid dysfunction  or cortisol abnormalities.  However, given presence of advanced age and multiple medical comorbidities, cardiac event must also be considered as well as AAA given the episode of abdominal pain yesterday.  Informed nursing of my concern and patient started on extensive medical evaluation which was expedited as much as possible tonight.  This is consistent with an acute life threatening illness. Initial Plan:  CTA chest abdomen pelvis to evaluate for structural intra-abdominal or intrathoracic etiology of patient's symptoms Screening labs including CBC and Metabolic panel to evaluate for infectious or metabolic etiology of disease.  Urinalysis with reflex culture ordered to evaluate for UTI or relevant urologic/nephrologic pathology.  CXR to evaluate for structural/infectious intrathoracic pathology.  Troponin/baring natriuretic peptide/EKG to evaluate for cardiac pathology. Objective evaluation as below reviewed with plan for close reassessment  Initial Study Results:   Laboratory  All laboratory results reviewed without evidence of clinically relevant pathology.   Elevated troponin and BNp  EKG EKG was reviewed independently. Rate, rhythm, axis, intervals all examined and without medically relevant abnormality. ST segments without concerns for elevations.    Radiology  All images reviewed independently. Agree with radiology report at this time.   CT ANGIO CHEST/ABD/PEL FOR DISSECTION W &/OR WO CONTRAST  Result Date: 03/13/2022 CLINICAL DATA:  Diaphoresis and dizziness; concern for acute aortic syndrome EXAM: CT ANGIOGRAPHY CHEST, ABDOMEN AND PELVIS TECHNIQUE: Non-contrast CT of the chest was initially obtained. Multidetector CT imaging through the chest, abdomen and pelvis was performed using the standard protocol during bolus administration of intravenous contrast. Multiplanar reconstructed images and MIPs were obtained and reviewed to evaluate the vascular anatomy. RADIATION DOSE REDUCTION:  This exam was performed according to the departmental dose-optimization program which includes automated exposure control, adjustment of the mA and/or kV according to patient size and/or use of iterative reconstruction technique. CONTRAST:  164m OMNIPAQUE IOHEXOL 350 MG/ML SOLN COMPARISON:  CT chest angiogram 03/21/2015 and CT abdomen and pelvis 01/10/2015. FINDINGS: CTA CHEST FINDINGS Cardiovascular: Preferential opacification of the thoracic aorta. No evidence of thoracic aortic aneurysm or dissection. Cardiomegaly. Small-moderate pericardial effusion, slightly increased from 03/21/2015. The pulmonary arteries are well opacified and do not demonstrate acute pulmonary embolism. Mild enlargement of the right and left main pulmonary arteries measuring 3.2 cm on the right and 3.5 cm on the left. The main pulmonary artery is normal in caliber. Mediastinum/Nodes: Small thyroid nodules do not require follow-up. No thoracic adenopathy by size. Unremarkable esophagus. Lungs/Pleura: Bibasilar atelectasis/scarring. Mild bronchial wall thickening and scattered mucous plugging. No pleural effusion or pneumothorax. Mild mosaic attenuation can be seen with air trapping. Musculoskeletal: No chest wall abnormality. No acute or significant osseous findings. Review of the MIP images confirms the above findings. CTA ABDOMEN AND PELVIS FINDINGS VASCULAR Aorta: Aortic atherosclerosis. No aneurysm, dissection, penetrating atherosclerotic ulcer, or intramural hematoma. Celiac: Calcified plaque at the origin causes moderate-advanced narrowing. SMA: Calcified plaque at the origin causes mild-moderate narrowing. Renals: Calcified plaque at the origins of  both renal arteries causes mild narrowing bilaterally. IMA: Patent. Inflow: Scattered calcified plaque without significant narrowing. No aneurysm or dissection. Veins: No obvious venous abnormality within the limitations of this arterial phase study. Review of the MIP images confirms the  above findings. NON-VASCULAR Hepatobiliary: No focal liver abnormality is seen. No gallstones, gallbladder wall thickening, or biliary dilatation. Pancreas: Unremarkable. No pancreatic ductal dilatation or surrounding inflammatory changes. Spleen: Normal in size without focal abnormality. Adrenals/Urinary Tract: Unremarkable adrenal glands. Bilateral cortical renal scarring. Nonobstructing left nephrolithiasis. Right nephrolithiasis or parenchymal calcification. No hydronephrosis. Intermediate density lesion in the upper pole of the left kidney. This was previously evaluated with ultrasound on 04/11/2015 demonstrating a simple cyst. Unremarkable bladder. Stomach/Bowel: Stomach is within normal limits. No evidence of bowel wall thickening, distention, or inflammatory changes. Colonic diverticulosis without diverticulitis. Lymphatic: No abdominopelvic lymphadenopathy. Reproductive: Unremarkable. Other: No free intraperitoneal fluid or air. Musculoskeletal: No acute osseous abnormality. Review of the MIP images confirms the above findings. IMPRESSION: 1. Negative for acute aortic syndrome. Aortic Atherosclerosis (ICD10-I70.0). 2. Cardiomegaly.  Small-moderate pericardial effusion. 3. Small airway infection/inflammation. 4. No acute abnormality in the abdomen or pelvis. 5. Bilateral nonobstructing nephrolithiasis. 6. Colonic diverticulosis without diverticulitis. Electronically Signed   By: Placido Sou M.D.   On: 03/13/2022 22:18     Final Assessment and Plan:   Patient's objective evaluation is grossly reassuring.  However she continues to be dizzy and presyncopal.  She has had poor p.o. intake and has an elevated troponin.  No evidence of ischemic disease on the EKG.  However given this constellation of symptoms, patient is going to require further inpatient care and management.  No acute indication for more intervention in the emergency department and hospital team was communicated with agreeing on admission.   Patient admitted with no further acute events.    Clinical Impression:  1. Dizziness      Admit   Final Clinical Impression(s) / ED Diagnoses Final diagnoses:  Dizziness    Rx / DC Orders ED Discharge Orders     None         Tretha Sciara, MD 03/13/22 2359

## 2022-03-13 NOTE — ED Triage Notes (Signed)
Pt BIB [facility] from [facility] for c/o dizziness and diaphoretic. Pt states yesterday she had some abdominal pain, but today she has been dizzy and sweating all day. Facility reports her gait being off, leaning to right side. Pt states she last felt normal last night. Pt is on eliquis. Has hx of previous stroke. EMS noted pt to be in a-fib HR 90s.  BP 124/56 CBG 146

## 2022-03-13 NOTE — ED Notes (Signed)
To ct

## 2022-03-14 ENCOUNTER — Other Ambulatory Visit: Payer: Self-pay

## 2022-03-14 DIAGNOSIS — R42 Dizziness and giddiness: Secondary | ICD-10-CM | POA: Diagnosis not present

## 2022-03-14 LAB — URINALYSIS, ROUTINE W REFLEX MICROSCOPIC
Bilirubin Urine: NEGATIVE
Glucose, UA: NEGATIVE mg/dL
Ketones, ur: NEGATIVE mg/dL
Nitrite: NEGATIVE
Protein, ur: 30 mg/dL — AB
Specific Gravity, Urine: >1.046 — ABNORMAL HIGH (ref 1.005–1.030)
WBC, UA: >50 WBC/hpf — ABNORMAL HIGH (ref 0–5)
pH: 6 (ref 5.0–8.0)

## 2022-03-14 LAB — TROPONIN I (HIGH SENSITIVITY)
Troponin I (High Sensitivity): 16 ng/L (ref <18)
Troponin I (High Sensitivity): 18 ng/L — ABNORMAL HIGH (ref <18)

## 2022-03-14 LAB — SARS CORONAVIRUS 2 BY RT PCR: SARS Coronavirus 2 by RT PCR: NEGATIVE

## 2022-03-14 LAB — MAGNESIUM: Magnesium: 1.8 mg/dL (ref 1.7–2.4)

## 2022-03-14 MED ORDER — POTASSIUM CHLORIDE 10 MEQ/100ML IV SOLN
10.0000 meq | INTRAVENOUS | Status: AC
  Administered 2022-03-14 (×2): 10 meq via INTRAVENOUS
  Filled 2022-03-14 (×2): qty 100

## 2022-03-14 MED ORDER — ACETAMINOPHEN 325 MG PO TABS: 650.0000 mg | ORAL_TABLET | ORAL | Status: DC | PRN

## 2022-03-14 MED ORDER — VITAMIN D 25 MCG (1000 UNIT) PO TABS
2000.0000 [IU] | ORAL_TABLET | Freq: Every day | ORAL | Status: DC
  Administered 2022-03-14 – 2022-03-15 (×2): 2000 [IU] via ORAL
  Filled 2022-03-14 (×2): qty 2

## 2022-03-14 MED ORDER — ADULT MULTIVITAMIN W/MINERALS CH
1.0000 | ORAL_TABLET | Freq: Every day | ORAL | Status: DC
  Administered 2022-03-14 – 2022-03-15 (×2): 1 via ORAL
  Filled 2022-03-14 (×2): qty 1

## 2022-03-14 MED ORDER — SODIUM CHLORIDE 0.9 % IV SOLN
500.0000 mg | INTRAVENOUS | Status: DC
  Administered 2022-03-14: 500 mg via INTRAVENOUS
  Filled 2022-03-14 (×2): qty 5

## 2022-03-14 MED ORDER — ONDANSETRON HCL 4 MG/2ML IJ SOLN: 4.0000 mg | Freq: Four times a day (QID) | INTRAMUSCULAR | Status: DC | PRN

## 2022-03-14 MED ORDER — SODIUM CHLORIDE 0.9 % IV SOLN
2.0000 g | INTRAVENOUS | Status: DC
  Administered 2022-03-14: 2 g via INTRAVENOUS
  Filled 2022-03-14: qty 20

## 2022-03-14 MED ORDER — POTASSIUM CHLORIDE 20 MEQ PO PACK
20.0000 meq | PACK | Freq: Once | ORAL | Status: AC
  Administered 2022-03-14: 20 meq via ORAL
  Filled 2022-03-14: qty 1

## 2022-03-14 MED ORDER — POLYETHYLENE GLYCOL 3350 17 G PO PACK: 17.0000 g | PACK | Freq: Every day | ORAL | Status: DC | PRN

## 2022-03-14 MED ORDER — GABAPENTIN 300 MG PO CAPS
600.0000 mg | ORAL_CAPSULE | Freq: Three times a day (TID) | ORAL | Status: DC
  Administered 2022-03-14 – 2022-03-15 (×4): 600 mg via ORAL
  Filled 2022-03-14 (×4): qty 2

## 2022-03-14 MED ORDER — ESCITALOPRAM OXALATE 10 MG PO TABS
5.0000 mg | ORAL_TABLET | Freq: Every day | ORAL | Status: DC
  Administered 2022-03-14 – 2022-03-15 (×2): 5 mg via ORAL
  Filled 2022-03-14 (×2): qty 1

## 2022-03-14 MED ORDER — METOPROLOL TARTRATE 25 MG PO TABS
25.0000 mg | ORAL_TABLET | Freq: Two times a day (BID) | ORAL | Status: DC
  Administered 2022-03-14 – 2022-03-15 (×3): 25 mg via ORAL
  Filled 2022-03-14 (×3): qty 1

## 2022-03-14 MED ORDER — SALINE SPRAY 0.65 % NA SOLN: 1.0000 | Freq: Two times a day (BID) | NASAL | Status: DC | PRN

## 2022-03-14 MED ORDER — LACTATED RINGERS IV SOLN: INTRAVENOUS | Status: DC

## 2022-03-14 MED ORDER — APIXABAN 5 MG PO TABS
5.0000 mg | ORAL_TABLET | Freq: Two times a day (BID) | ORAL | Status: DC
  Administered 2022-03-14 – 2022-03-15 (×3): 5 mg via ORAL
  Filled 2022-03-14 (×3): qty 1

## 2022-03-14 MED ORDER — FLUTICASONE PROPIONATE 50 MCG/ACT NA SUSP
2.0000 | Freq: Two times a day (BID) | NASAL | Status: DC
  Administered 2022-03-14 – 2022-03-15 (×3): 2 via NASAL
  Filled 2022-03-14: qty 16

## 2022-03-14 MED ORDER — SODIUM CHLORIDE 0.45 % IV SOLN: INTRAVENOUS | Status: DC

## 2022-03-14 MED ORDER — PRAVASTATIN SODIUM 10 MG PO TABS
20.0000 mg | ORAL_TABLET | Freq: Every day | ORAL | Status: DC
  Administered 2022-03-14: 20 mg via ORAL
  Filled 2022-03-14: qty 2

## 2022-03-14 NOTE — ED Notes (Signed)
Pt found at edge of bed, trying to crawl out of bed to leave. Pt educated about her IV medications running and the importance of staying in bed and using the call bell.

## 2022-03-14 NOTE — ED Notes (Signed)
Pt caught crawling out of bed, educated once again about use of call bell. Pt placed on bed alarm.

## 2022-03-14 NOTE — ED Notes (Signed)
Dorise Bullion (Son) called asking for an update on Samantha Bishop. His number is [phone].

## 2022-03-14 NOTE — ED Notes (Signed)
Pt provided with decaf coffee.

## 2022-03-14 NOTE — ED Notes (Signed)
Pt found at edge of bed, attempting to leave room. Pt educated that she needs to remain in bed and use the call bell. Pt informing this Probation officer that she does not want to be admitted and wants to go home. MD is aware.

## 2022-03-14 NOTE — Progress Notes (Addendum)
PROGRESS NOTE    Samantha Bishop  RFF:[id number] DOB: 11-02-1924 DOA: 03/13/2022 PCP: Hoyt Koch, MD   Brief Narrative: 89 year old with past medical history significant for cerebral aneurysm, anxiety disorder, hypertension, A-fib on Eliquis presented from a skilled nursing facility with dizziness, lightheadedness and diaphoresis.  CT was negative for dissection.  Troponin mildly elevated at 27.   Assessment & Plan:   Principal Problem:   Dizziness and giddiness Active Problems:   Hyperlipidemia   Essential hypertension   Atrial fibrillation - followed by Dr. Caryl Comes   GERD   1-Dizziness, Diaphoresis:  -Plan to check Orthostatic.  -IV fluids.  -Suspect dehydration.  -PT eval.  -ECHO -suspect diaphoresis in setting infection UTI  Bronchitis on CT; started Zithromax.  A-fib: Continue with eliquis and metoprolol   Essential hypertension; continue with metoprolol.   GERD: Continue with PPI Hyperlipidemia: Continue with the statins Anxiety disorder: Continue with Lexapro  UTI; UA with more than 50 WBC>  Start Antibiotics.  Check urine culture.   Mild elevation of troponin. Flat. Unlikely ACS.  Chest pain free.    Hypokalemia; Replete IV and oral. Check mg  Estimated body mass index is 31.14 kg/m as calculated from the following:   Height as of 01/07/22: 5' 2.5" (1.588 m).   Weight as of 01/07/22: 78.5 kg.   DVT prophylaxis: Eliquis Code Status: Full code Family Communication: son didn't answer phone call.  Disposition Plan:  Status is: Inpatient Not inpatient appropriate, will call [facility] team and downgrade to OBS.     Consultants:  None  Procedures:  ECHO  Antimicrobials:    Subjective: She si alert, deneis dizziness. Reporter diaphoresis.  She wishes to be discharge. I explain to her she is not medical ready. She relates  She does not want [place] to call her daughter, or niece Shauntea. She wants [place] to cal; her son with updates.    Objective: Vitals:   03/13/22 2215 03/13/22 2216 03/14/22 0130 03/14/22 0329  BP: (!) 165/68  (!) 139/100   Pulse: 78  86   Resp: 15  16   Temp:  98.3 F (36.8 C)  98.4 F (36.9 C)  SpO2: 93%  92%     Intake/Output Summary (Last 24 hours) at 03/14/2022 0743 Last data filed at 03/14/2022 0731 Gross per 24 hour  Intake 1000 ml  Output --  Net 1000 ml   There were no vitals filed for this visit.  Examination:  General exam: Appears calm and comfortable  Respiratory system: Clear to auscultation. Respiratory effort normal. Cardiovascular system: S1 & S2 heard, RRR.  Gastrointestinal system: Abdomen is nondistended, soft and nontender. No organomegaly or masses felt. Normal bowel sounds heard. Central nervous system: Alert and oriented.  Extremities: Symmetric 5 x 5 power.   Data Reviewed: I have personally reviewed following labs and imaging studies  CBC: Recent Labs  Lab 03/13/22 1912  WBC 13.1*  NEUTROABS 9.9*  HGB 11.3*  HCT 34.2*  MCV 87.7  PLT 357   Basic Metabolic Panel: Recent Labs  Lab 03/13/22 1912  NA 136  K 3.2*  CL 100  CO2 25  GLUCOSE 125*  BUN 16  CREATININE 0.76  CALCIUM 9.3   GFR: CrCl cannot be calculated (Unknown ideal weight.). Liver Function Tests: Recent Labs  Lab 03/13/22 1912  AST 22  ALT 12  ALKPHOS 52  BILITOT 1.5*  PROT 6.9  ALBUMIN 3.9   No results for input(s): "LIPASE", "AMYLASE" in the last 168 hours. No  results for input(s): "AMMONIA" in the last 168 hours. Coagulation Profile: No results for input(s): "INR", "PROTIME" in the last 168 hours. Cardiac Enzymes: No results for input(s): "CKTOTAL", "CKMB", "CKMBINDEX", "TROPONINI" in the last 168 hours. BNP (last 3 results) No results for input(s): "PROBNP" in the last 8760 hours. HbA1C: No results for input(s): "HGBA1C" in the last 72 hours. CBG: No results for input(s): "GLUCAP" in the last 168 hours. Lipid Profile: No results for input(s): "CHOL", "HDL",  "LDLCALC", "TRIG", "CHOLHDL", "LDLDIRECT" in the last 72 hours. Thyroid Function Tests: Recent Labs    03/13/22 1912  TSH 0.692   Anemia Panel: No results for input(s): "VITAMINB12", "FOLATE", "FERRITIN", "TIBC", "IRON", "RETICCTPCT" in the last 72 hours. Sepsis Labs: No results for input(s): "PROCALCITON", "LATICACIDVEN" in the last 168 hours.  No results found for this or any previous visit (from the past 240 hour(s)).       Radiology Studies: CT ANGIO CHEST/ABD/PEL FOR DISSECTION W &/OR WO CONTRAST  Result Date: 03/13/2022 CLINICAL DATA:  Diaphoresis and dizziness; concern for acute aortic syndrome EXAM: CT ANGIOGRAPHY CHEST, ABDOMEN AND PELVIS TECHNIQUE: Non-contrast CT of the chest was initially obtained. Multidetector CT imaging through the chest, abdomen and pelvis was performed using the standard protocol during bolus administration of intravenous contrast. Multiplanar reconstructed images and MIPs were obtained and reviewed to evaluate the vascular anatomy. RADIATION DOSE REDUCTION: This exam was performed according to the departmental dose-optimization program which includes automated exposure control, adjustment of the mA and/or kV according to patient size and/or use of iterative reconstruction technique. CONTRAST:  165m OMNIPAQUE IOHEXOL 350 MG/ML SOLN COMPARISON:  CT chest angiogram 03/21/2015 and CT abdomen and pelvis 01/10/2015. FINDINGS: CTA CHEST FINDINGS Cardiovascular: Preferential opacification of the thoracic aorta. No evidence of thoracic aortic aneurysm or dissection. Cardiomegaly. Small-moderate pericardial effusion, slightly increased from 03/21/2015. The pulmonary arteries are well opacified and do not demonstrate acute pulmonary embolism. Mild enlargement of the right and left main pulmonary arteries measuring 3.2 cm on the right and 3.5 cm on the left. The main pulmonary artery is normal in caliber. Mediastinum/Nodes: Small thyroid nodules do not require follow-up.  No thoracic adenopathy by size. Unremarkable esophagus. Lungs/Pleura: Bibasilar atelectasis/scarring. Mild bronchial wall thickening and scattered mucous plugging. No pleural effusion or pneumothorax. Mild mosaic attenuation can be seen with air trapping. Musculoskeletal: No chest wall abnormality. No acute or significant osseous findings. Review of the MIP images confirms the above findings. CTA ABDOMEN AND PELVIS FINDINGS VASCULAR Aorta: Aortic atherosclerosis. No aneurysm, dissection, penetrating atherosclerotic ulcer, or intramural hematoma. Celiac: Calcified plaque at the origin causes moderate-advanced narrowing. SMA: Calcified plaque at the origin causes mild-moderate narrowing. Renals: Calcified plaque at the origins of both renal arteries causes mild narrowing bilaterally. IMA: Patent. Inflow: Scattered calcified plaque without significant narrowing. No aneurysm or dissection. Veins: No obvious venous abnormality within the limitations of this arterial phase study. Review of the MIP images confirms the above findings. NON-VASCULAR Hepatobiliary: No focal liver abnormality is seen. No gallstones, gallbladder wall thickening, or biliary dilatation. Pancreas: Unremarkable. No pancreatic ductal dilatation or surrounding inflammatory changes. Spleen: Normal in size without focal abnormality. Adrenals/Urinary Tract: Unremarkable adrenal glands. Bilateral cortical renal scarring. Nonobstructing left nephrolithiasis. Right nephrolithiasis or parenchymal calcification. No hydronephrosis. Intermediate density lesion in the upper pole of the left kidney. This was previously evaluated with ultrasound on 04/11/2015 demonstrating a simple cyst. Unremarkable bladder. Stomach/Bowel: Stomach is within normal limits. No evidence of bowel wall thickening, distention, or inflammatory changes. Colonic diverticulosis without  diverticulitis. Lymphatic: No abdominopelvic lymphadenopathy. Reproductive: Unremarkable. Other: No free  intraperitoneal fluid or air. Musculoskeletal: No acute osseous abnormality. Review of the MIP images confirms the above findings. IMPRESSION: 1. Negative for acute aortic syndrome. Aortic Atherosclerosis (ICD10-I70.0). 2. Cardiomegaly.  Small-moderate pericardial effusion. 3. Small airway infection/inflammation. 4. No acute abnormality in the abdomen or pelvis. 5. Bilateral nonobstructing nephrolithiasis. 6. Colonic diverticulosis without diverticulitis. Electronically Signed   By: Placido Sou M.D.   On: 03/13/2022 22:18        Scheduled Meds:  vitamin D3  2,000 Units Oral Daily   escitalopram  5 mg Oral Daily   fluticasone  2 spray Each Nare BID   gabapentin  600 mg Oral TID   multivitamin with minerals  1 tablet Oral Daily   pravastatin  20 mg Oral q1800   Continuous Infusions:  potassium chloride       LOS: 1 day    Time spent: 35 minutes.     Elmarie Shiley, MD [facility]   If 7PM-7AM, please contact night-coverage [email or web address]  03/14/2022, 7:43 AM

## 2022-03-14 NOTE — Progress Notes (Signed)
Permission received from pt to update Poetry Cerro (son) on condition. Son contacted and given update, all questions/concerns addressed.

## 2022-03-14 NOTE — ED Notes (Signed)
Pt informed this Probation officer that she does not want her family called or anyone to be notified that she is here. Pt is alert and oriented.

## 2022-03-15 ENCOUNTER — Other Ambulatory Visit (HOSPITAL_COMMUNITY): Payer: Medicare Other

## 2022-03-15 DIAGNOSIS — R42 Dizziness and giddiness: Secondary | ICD-10-CM | POA: Diagnosis not present

## 2022-03-15 LAB — BASIC METABOLIC PANEL
Anion gap: 9 (ref 5–15)
BUN: 12 mg/dL (ref 8–23)
CO2: 25 mmol/L (ref 22–32)
Calcium: 8.8 mg/dL — ABNORMAL LOW (ref 8.9–10.3)
Chloride: 101 mmol/L (ref 98–111)
Creatinine, Ser: 0.58 mg/dL (ref 0.44–1.00)
GFR, Estimated: >60 mL/min (ref >60)
Glucose, Bld: 112 mg/dL — ABNORMAL HIGH (ref 70–99)
Potassium: 4 mmol/L (ref 3.5–5.1)
Sodium: 135 mmol/L (ref 135–145)

## 2022-03-15 LAB — CBC
HCT: 31.8 % — ABNORMAL LOW (ref 36.0–46.0)
Hemoglobin: 10.3 g/dL — ABNORMAL LOW (ref 12.0–15.0)
MCH: 28.6 pg (ref 26.0–34.0)
MCHC: 32.4 g/dL (ref 30.0–36.0)
MCV: 88.3 fL (ref 80.0–100.0)
Platelets: 153 10*3/uL (ref 150–400)
RBC: 3.6 MIL/uL — ABNORMAL LOW (ref 3.87–5.11)
RDW: 14.1 % (ref 11.5–15.5)
WBC: 7.2 10*3/uL (ref 4.0–10.5)
nRBC: 0 % (ref 0.0–0.2)

## 2022-03-15 MED ORDER — AZITHROMYCIN 500 MG PO TABS: 500.0000 mg | ORAL_TABLET | Freq: Every day | ORAL | 0 refills | Status: AC

## 2022-03-15 MED ORDER — CEPHALEXIN 500 MG PO CAPS: 500.0000 mg | ORAL_CAPSULE | Freq: Three times a day (TID) | ORAL | 0 refills | Status: AC

## 2022-03-15 MED ORDER — POTASSIUM CHLORIDE CRYS ER 10 MEQ PO TBCR: 10.0000 meq | EXTENDED_RELEASE_TABLET | Freq: Every day | ORAL | 0 refills | Status: DC

## 2022-03-15 MED ORDER — METOPROLOL TARTRATE 25 MG PO TABS: 25.0000 mg | ORAL_TABLET | Freq: Two times a day (BID) | ORAL | 0 refills | Status: DC

## 2022-03-15 MED ORDER — MAGNESIUM 200 MG PO CHEW: 200.0000 mg | CHEWABLE_TABLET | Freq: Two times a day (BID) | ORAL | 0 refills | Status: AC

## 2022-03-15 MED ORDER — MAGNESIUM SULFATE 2 GM/50ML IV SOLN
2.0000 g | Freq: Once | INTRAVENOUS | Status: DC
  Filled 2022-03-15: qty 50

## 2022-03-15 NOTE — Evaluation (Signed)
Physical Therapy Evaluation Patient Details Name: PANZY BUBECK MRN: [id number] DOB: 02-Dec-1924 Today's Date: 03/15/2022  History of Present Illness  The pt is a 88 yo female presenting 11/4 from independent living facility with dizziness and diaphoresis. PMH includes: cerebral aneurysm, anxiety, HTN, afib on eliquis, HLD, and bilateral TKA.   Clinical Impression  Pt in bed upon arrival of PT, agreeable to evaluation at this time. Prior to admission the pt reports she has had no falls in last 6 months, uses no DME in her apt, but uses rollator to ambulate to dining hall and [facility] for community ambulation. The pt now presents with limitations in functional mobility, power, strength, dynamic stability, and endurance. She initially required multiple attempts to complete sit-stand transfer without assistance, but improved after mobility. She completed short-distance ambulation in the room without use of DME, but demos short strides with shuffling steps and mild-moderate instability resulting in pt reaching for UE support on furniture as she passed. She demos improved stability with use of RW, and was able to complete hallway ambulation without physical assistance. The pt reports she is at her baseline mobility and eager to return home, but does demo minor deficits in standing balance and dynamic balance that benefit from BUE support and could benefit from OPPT for continued balance improvement to reduce risk of falls.   Gait Speed: 0.55ms using RW (Gait speed <0.623m indicates increased risk of falls and dependence in ADLs)      Recommendations for follow up therapy are one component of a multi-disciplinary discharge planning process, led by the attending physician.  Recommendations may be updated based on patient status, additional functional criteria and insurance authorization.  Follow Up Recommendations Home health PT      Assistance Recommended at Discharge PRN  Patient can return home with the  following  Direct supervision/assist for medications management;Direct supervision/assist for financial management;Assist for transportation;Help with stairs or ramp for entrance    Equipment Recommendations None recommended by PT (pt has needed DME)  Recommendations for Other Services       Functional Status Assessment Patient has had a recent decline in their functional status and demonstrates the ability to make significant improvements in function in a reasonable and predictable amount of time.     Precautions / Restrictions Precautions Precautions: Fall Precaution Comments: HOH, better in R ear Restrictions Weight Bearing Restrictions: No      Mobility  Bed Mobility Overal bed mobility: Independent                  Transfers Overall transfer level: Needs assistance Equipment used: None Transfers: Sit to/from Stand Sit to Stand: Supervision           General transfer comment: increased time, supervision for safety    Ambulation/Gait Ambulation/Gait assistance: Min guard, Supervision Gait Distance (Feet): 15 Feet (+ 7532fAssistive device: None, Rolling walker (2 wheels) Gait Pattern/deviations: Step-through pattern, Decreased stride length, Shuffle, Trunk flexed Gait velocity: decreased Gait velocity interpretation: <1.31 ft/sec, indicative of household ambulator   General Gait Details: pt initially states she does not use DME, needed minG for balance with no DME, then states she needs cane or rollator to ambulate and was able to complete with supervision     Balance Overall balance assessment: Needs assistance Sitting-balance support: No upper extremity supported, Feet supported Sitting balance-Leahy Scale: Good Sitting balance - Comments: leaning outside BOS without issue   Standing balance support: No upper extremity supported, Bilateral upper extremity supported Standing balance-Leahy  Scale: Fair Standing balance comment: mild instability,  improved with BUE support                             Pertinent Vitals/Pain Pain Assessment Pain Assessment: No/denies pain    Home Living Family/patient expects to be discharged to:: Private residence (independent living) Living Arrangements: Alone Available Help at Discharge: Friend(s);Available PRN/intermittently Type of Home: Independent living facility Home Access: Elevator       Home Layout: One level Home Equipment: Rollator (4 wheels);Cane - single point;Shower seat;Grab bars - toilet;Grab bars - tub/shower Additional Comments: pt lives in 2nd floor apt at independent living facility    Prior Function Prior Level of Function : Independent/Modified Independent             Mobility Comments: reports no falls, uses cane for errands and MD appointments, uses rollator to walk to dining hall, no DME in her apt ADLs Comments: pt reports independence     Hand Dominance   Dominant Hand: Right    Extremity/Trunk Assessment   Upper Extremity Assessment Upper Extremity Assessment: Defer to OT evaluation    Lower Extremity Assessment Lower Extremity Assessment: Generalized weakness    Cervical / Trunk Assessment Cervical / Trunk Assessment: Kyphotic  Communication   Communication: HOH  Cognition Arousal/Alertness: Awake/alert Behavior During Therapy: Impulsive Overall Cognitive Status: Impaired/Different from baseline Area of Impairment: Following commands, Safety/judgement, Awareness, Problem solving                       Following Commands: Follows one step commands consistently, Follows one step commands with increased time Safety/Judgement: Decreased awareness of safety, Decreased awareness of deficits Awareness: Intellectual Problem Solving: Slow processing, Difficulty sequencing, Requires verbal cues, Requires tactile cues General Comments: pt needing loud cues to R ear, impulsive and not following instructions regarding not getting up  without assistance. pt giving slightly different answers when asked questions multiple times unclear if due to hearing difficulties or cog        General Comments General comments (skin integrity, edema, etc.): VSS on RA, no change in BP    Exercises     Assessment/Plan    PT Assessment Patient needs continued PT services  PT Problem List Decreased strength;Decreased balance;Decreased mobility       PT Treatment Interventions DME instruction;Gait training;Functional mobility training;Stair training;Therapeutic activities;Therapeutic exercise;Balance training;Patient/family education    PT Goals (Current goals can be found in the Care Plan section)  Acute Rehab PT Goals Patient Stated Goal: return home today PT Goal Formulation: With patient Time For Goal Achievement: 03/29/22 Potential to Achieve Goals: Good    Frequency Min 3X/week        AM-PAC PT "6 Clicks" Mobility  Outcome Measure Help needed turning from your back to your side while in a flat bed without using bedrails?: None Help needed moving from lying on your back to sitting on the side of a flat bed without using bedrails?: None Help needed moving to and from a bed to a chair (including a wheelchair)?: A Little Help needed standing up from a chair using your arms (e.g., wheelchair or bedside chair)?: A Little Help needed to walk in hospital room?: A Little Help needed climbing 3-5 steps with a railing? : A Little 6 Click Score: 20    End of Session Equipment Utilized During Treatment: Gait belt Activity Tolerance: Patient tolerated treatment well Patient left: in bed;with call bell/phone within  reach;with bed alarm set (sitting EOB) Nurse Communication: Mobility status PT Visit Diagnosis: Unsteadiness on feet (R26.81)    Time: 3437-3578 PT Time Calculation (min) (ACUTE ONLY): 24 min   Charges:   PT Evaluation $PT Eval Low Complexity: 1 Low PT Treatments $Gait Training: 8-22 mins        [place] Carbo, PT, DPT   Acute Rehabilitation Department  Sandra Cockayne 03/15/2022, 9:14 AM

## 2022-03-15 NOTE — TOC Transition Note (Addendum)
Transition of Care [facility]) - CM/SW Discharge Note   Patient Details  Name: Samantha Bishop MRN: [id number] Date of Birth: 06/14/1924  Transition [facility] CM/SW Contact:  Zenon Mayo, RN Phone Number: 03/15/2022, 9:54 AM   Clinical Narrative:    Patient is from [name], NCM offered choice with Medicare. Gov list for HHPT,  she states she does not want HHPT, she states she can do this at home , NCM asked if she would like for this NCM to set it up at her IDL, she said no she will do it.  She states they will also send someone to transport her home. She has no other needs, she states she still drives and she will drive herself to the pharmacy to get her meds. NCM contacted [facility], they states she will need PTAR transport back.  PTAR has been scheduled.          Patient Goals and CMS Choice        Discharge Placement                       Discharge Plan and Services                                     Social Determinants of Health (SDOH) Interventions     Readmission Risk Interventions     No data to display

## 2022-03-15 NOTE — Progress Notes (Signed)
Patients daughter was called by Demetrius Charity to arrange transportation home

## 2022-03-15 NOTE — Progress Notes (Signed)
At bedside for PIV placement. Pt refusing IV access at this time. RN aware and will make attending MD aware. RN to place another consult if/when PIV access can be placed.

## 2022-03-15 NOTE — Progress Notes (Signed)
Patient iv was out when RN went to give medicine to patient.patient refused to get her new IV stating she is going home today.MD notified. IV antibiotics and iv magnesium not given.

## 2022-03-15 NOTE — Consult Note (Signed)
   [facility] Inpatient Consult   03/15/2022  Samantha Bishop [birth year] [id number]  [place] [facility] [ACO] Patient: Medicare ACO REACH  Primary Care Provider:  Hoyt Koch, MD with Velora Heckler at [facility]   Patient screened for hospitalization to assess for potential [place] Management service needs for post hospital transition for care coordination.  Review of patient's electronic medical record reveals patient is for home which is [facility].  Discussed in unit progression with inpatient [facility] team for potential post hospital needs. Met with the patient at the bedside, she endorses PCP and states she has a follow up with the PCP every 3 months and all.  Patient has a TOC appointment and gave patient a reminder card of upcoming appointment with a 24 hour nurse advise line. Patient denies any additional needs states, "I'm ready to go can someone take all of this stuff off of me so that I get dressed."  Informed unit staff of request with Network engineer.  Plan:  No additional needs per patient.  Of note, [facility] Care Management/Population Health does not replace or interfere with any arrangements made by the Inpatient Transition of Care team.  For questions contact:   Natividad Brood, RN BSN [place]  [phone] business mobile phone Toll free office [phone]  *[place]  [phone] Fax number: [phone] [email or web address]

## 2022-03-15 NOTE — Evaluation (Signed)
Occupational Therapy Evaluation Patient Details Name: Samantha Bishop MRN: [id number] DOB: 12-01-1924 Today's Date: 03/15/2022   History of Present Illness The pt is a 88 yo female presenting 11/4 from independent living facility with dizziness and diaphoresis. PMH includes: cerebral aneurysm, anxiety, HTN, afib on eliquis, HLD, and bilateral TKA.   Clinical Impression   Samantha Bishop was evaluated s/p the above admission list, she is at an ALF and is indep for ADLs and mod I for community mobility. Upon evaluation she had functional limitations due to impulsivity, limited safety awareness, decreased activity tolerance, generalized weakness and HOH. Overall she required cues for safety, RW management and attention. Physical assist needed to don shoes only, otherwise pt was min G- supervision A. OT to follow acutely. Recommend HHOT, however pt likely to decline.      Recommendations for follow up therapy are one component of a multi-disciplinary discharge planning process, led by the attending physician.  Recommendations may be updated based on patient status, additional functional criteria and insurance authorization.   Follow Up Recommendations  Home health OT (pt currently declining)    Assistance Recommended at Discharge Intermittent Supervision/Assistance  Patient can return home with the following A little help with walking and/or transfers;A little help with bathing/dressing/bathroom;Assistance with cooking/housework;Direct supervision/assist for medications management;Direct supervision/assist for financial management;Assist for transportation;Help with stairs or ramp for entrance       Equipment Recommendations  None recommended by OT       Precautions / Restrictions Precautions Precautions: Fall Precaution Comments: HOH, better in R ear Restrictions Weight Bearing Restrictions: No      Mobility Bed Mobility Overal bed mobility: Independent                   Transfers Overall transfer level: Needs assistance Equipment used: Rolling walker (2 wheels) Transfers: Sit to/from Stand Sit to Stand: Supervision                  Balance Overall balance assessment: Needs assistance Sitting-balance support: No upper extremity supported, Feet supported Sitting balance-Leahy Scale: Good     Standing balance support: No upper extremity supported, Bilateral upper extremity supported Standing balance-Leahy Scale: Fair Standing balance comment: at the sink upon arrival                           ADL either performed or assessed with clinical judgement   ADL Overall ADL's : Needs assistance/impaired Eating/Feeding: Supervision/ safety;Sitting   Grooming: Supervision/safety;Standing   Upper Body Bathing: Supervision/ safety;Sitting   Lower Body Bathing: Supervison/ safety;Sit to/from stand   Upper Body Dressing : Supervision/safety;Sitting   Lower Body Dressing: Minimal assistance Lower Body Dressing Details (indicate cue type and reason): min A for shoes Toilet Transfer: Supervision/safety;Ambulation;Rolling walker (2 wheels) Toilet Transfer Details (indicate cue type and reason): more steady with RW Toileting- Clothing Manipulation and Hygiene: Supervision/safety;Sitting/lateral lean       Functional mobility during ADLs: Supervision/safety General ADL Comments: very HOH. impulsive and self distracting. Poor RW management.     Vision Baseline Vision/History: 0 No visual deficits Vision Assessment?: No apparent visual deficits     Perception Perception Perception: Not tested   Praxis Praxis Praxis: Not tested    Pertinent Vitals/Pain Pain Assessment Pain Assessment: No/denies pain     Hand Dominance Right   Extremity/Trunk Assessment Upper Extremity Assessment Upper Extremity Assessment: Generalized weakness   Lower Extremity Assessment Lower Extremity Assessment: Generalized weakness   Cervical /  Trunk Assessment Cervical / Trunk Assessment: Kyphotic   Communication Communication Communication: HOH   Cognition Arousal/Alertness: Awake/alert Behavior During Therapy: Impulsive Overall Cognitive Status: Impaired/Different from baseline Area of Impairment: Following commands, Safety/judgement, Awareness, Problem solving                       Following Commands: Follows one step commands consistently, Follows one step commands with increased time Safety/Judgement: Decreased awareness of safety, Decreased awareness of deficits Awareness: Intellectual Problem Solving: Slow processing, Difficulty sequencing, Requires verbal cues, Requires tactile cues General Comments: impulsive, likely baseline. Limited insight to safety, pt ambulating in room without AD upon arrival. self distracting     General Comments  VSS on RA            Home Living Family/patient expects to be discharged to:: Private residence Living Arrangements: Alone Available Help at Discharge: Friend(s);Available PRN/intermittently Type of Home: Independent living facility Home Access: Elevator     Home Layout: One level     Bathroom Shower/Tub: Occupational psychologist: Standard     Home Equipment: Rollator (4 wheels);Cane - single point;Shower seat;Grab bars - toilet;Grab bars - tub/shower   Additional Comments: pt lives in 2nd floor apt at independent living facility      Prior Functioning/Environment Prior Level of Function : Independent/Modified Independent             Mobility Comments: reports no falls, uses cane for errands and MD appointments, uses rollator to walk to dining hall, no DME in her apt ADLs Comments: pt reports independence        OT Problem List:        OT Treatment/Interventions:      OT Goals(Current goals can be found in the care plan section) Acute Rehab OT Goals Patient Stated Goal: home OT Goal Formulation: With patient Time For Goal  Achievement: 03/29/22 Potential to Achieve Goals: Good ADL Goals Additional ADL Goal #1: Pt will complete BADLs with mod I Additional ADL Goal #2: Pt will follow 2 step commands to complete IADL  OT Frequency: Min 2X/week    AM-PAC OT "6 Clicks" Daily Activity     Outcome Measure Help from another person eating meals?: None Help from another person taking care of personal grooming?: A Little Help from another person toileting, which includes using toliet, bedpan, or urinal?: A Little Help from another person bathing (including washing, rinsing, drying)?: A Little Help from another person to put on and taking off regular upper body clothing?: None Help from another person to put on and taking off regular lower body clothing?: A Little 6 Click Score: 20   End of Session Nurse Communication: Mobility status  Activity Tolerance: Patient tolerated treatment well Patient left: in chair;with call bell/phone within reach  OT Visit Diagnosis: Unsteadiness on feet (R26.81)                Time: 8185-6314 OT Time Calculation (min): 17 min Charges:  OT General Charges $OT Visit: 1 Visit OT Evaluation $OT Eval Moderate Complexity: 1 Mod   [place] Neisen D Causey 03/15/2022, 10:58 AM

## 2022-03-15 NOTE — Discharge Summary (Addendum)
Physician Discharge Summary   Patient: Samantha Bishop MRN: [id number] DOB: 03/27/1925  Admit date:     03/13/2022  Discharge date: 03/15/22  Discharge Physician: Samantha Bishop   PCP: Samantha Koch, MD   Recommendations at discharge:   Please follow Final Urine culture results.  Needs Bmet and Mg level now she will resume HCTZ  Discharge Diagnoses: Principal Problem:   Dizziness and giddiness Active Problems:   Hyperlipidemia   Essential hypertension   Atrial fibrillation - followed by Dr. Caryl Bishop   GERD  Resolved Problems:   * No resolved hospital problems. [facility] Course: 88 year old with past medical history significant for cerebral aneurysm, anxiety disorder, hypertension, A-fib on Eliquis presented from a skilled nursing facility with dizziness, lightheadedness and diaphoresis.  CT was negative for dissection.  Troponin mildly elevated at 27.    Assessment and Plan:  1-Dizziness, Diaphoresis:  -IV fluids.  -Suspect dehydration.  -PT eval.  -Suspect diaphoresis in setting infection UTI -Resolved, plan to cancel echo.   Bronchitis on CT; Started Zithromax. plan to complete 3 days.  A-fib: Continue with eliquis and metoprolol    Essential hypertension; continue with metoprolol and low dose HCTZ.    GERD: Continue with PPI Hyperlipidemia: Continue with the statins Anxiety disorder: Continue with Lexapro   UTI; UA with more than 50 WBC>  Started  Antibiotics, IV ceftriaxone. Discharge on Keflex for 3 days.  Follow urine urine culture and adjust antibiotics as needed. .    Mild elevation of troponin. Flat. Unlikely ACS.  Chest pain free.      Hypokalemia; Replete IV and oral. Replete mg. Discharge on mg and Kcl supplement. Needs follow up level.  I try twice to contact son , unsuccessful.      Consultants: None Procedures performed: None Disposition: Home Diet recommendation:  Discharge Diet Orders (From admission, onward)     Start      Ordered   03/15/22 0000  Diet - low sodium heart healthy        03/15/22 0912           Cardiac diet DISCHARGE MEDICATION: Allergies as of 03/15/2022       Reactions   Codeine Nausea Only   Penicillins Rash        Medication List     STOP taking these medications    nitrofurantoin (macrocrystal-monohydrate) 100 MG capsule Commonly known as: Macrobid       TAKE these medications    apixaban 5 MG Tabs tablet Commonly known as: Eliquis TAKE 1 TABLET (5 MG TOTAL) BY MOUTH 2 (TWO) TIMES DAILY. What changed:  how much to take how to take this when to take this   azithromycin 500 MG tablet Commonly known as: Zithromax Take 1 tablet (500 mg total) by mouth daily for 3 days. Take 1 tablet daily for 3 days.   cephALEXin 500 MG capsule Commonly known as: KEFLEX Take 1 capsule (500 mg total) by mouth 3 (three) times daily for 3 days.   escitalopram 5 MG tablet Commonly known as: Lexapro Take 1 tablet (5 mg total) by mouth daily.   fluticasone 50 MCG/ACT nasal spray Commonly known as: FLONASE Place 2 sprays into both nostrils daily. What changed: when to take this   gabapentin 300 MG capsule Commonly known as: NEURONTIN Take 2 capsules (600 mg total) by mouth 3 (three) times daily.   hydrochlorothiazide 12.5 MG capsule Commonly known as: MICROZIDE TAKE 1 CAPSULE(12.5 MG) BY MOUTH DAILY What  changed:  how much to take how to take this when to take this additional instructions   Magnesium 200 MG Chew Chew 200 mg by mouth 2 (two) times daily for 5 days.   metoprolol tartrate 25 MG tablet Commonly known as: LOPRESSOR Take 1 tablet (25 mg total) by mouth 2 (two) times daily.   multivitamin with minerals Tabs tablet Take 1 tablet by mouth daily.   polyethylene glycol 17 g packet Commonly known as: MIRALAX / GLYCOLAX Take 17 g by mouth daily as needed for moderate constipation.   potassium chloride 10 MEQ tablet Commonly known as: KLOR-CON M Take 1  tablet (10 mEq total) by mouth daily for 4 days.   pravastatin 20 MG tablet Commonly known as: PRAVACHOL TAKE 1 TABLET(20 MG) BY MOUTH DAILY AT 6 PM What changed:  how much to take how to take this when to take this additional instructions   sodium chloride 0.65 % Soln nasal spray Commonly known as: OCEAN Place 1 spray into both nostrils as needed for congestion. What changed: when to take this   vitamin D3 25 MCG tablet Commonly known as: CHOLECALCIFEROL Take 2 tablets (2,000 Units total) by mouth daily.        Follow-up Information     Samantha Koch, MD Follow up.   Specialty: Internal Medicine Contact information: [place] [address] [phone]                Discharge Exam: [name] Weights   03/14/22 1939  Weight: 80 kg   General; NAD  Condition at discharge: stable  The results of significant diagnostics from this hospitalization (including imaging, microbiology, ancillary and laboratory) are listed below for reference.   Imaging Studies: CT ANGIO CHEST/ABD/PEL FOR DISSECTION W &/OR WO CONTRAST  Result Date: 03/13/2022 CLINICAL DATA:  Diaphoresis and dizziness; concern for acute aortic syndrome EXAM: CT ANGIOGRAPHY CHEST, ABDOMEN AND PELVIS TECHNIQUE: Non-contrast CT of the chest was initially obtained. Multidetector CT imaging through the chest, abdomen and pelvis was performed using the standard protocol during bolus administration of intravenous contrast. Multiplanar reconstructed images and MIPs were obtained and reviewed to evaluate the vascular anatomy. RADIATION DOSE REDUCTION: This exam was performed according to the departmental dose-optimization program which includes automated exposure control, adjustment of the mA and/or kV according to patient size and/or use of iterative reconstruction technique. CONTRAST:  135m OMNIPAQUE IOHEXOL 350 MG/ML SOLN COMPARISON:  CT chest angiogram 03/21/2015 and CT abdomen and pelvis  01/10/2015. FINDINGS: CTA CHEST FINDINGS Cardiovascular: Preferential opacification of the thoracic aorta. No evidence of thoracic aortic aneurysm or dissection. Cardiomegaly. Small-moderate pericardial effusion, slightly increased from 03/21/2015. The pulmonary arteries are well opacified and do not demonstrate acute pulmonary embolism. Mild enlargement of the right and left main pulmonary arteries measuring 3.2 cm on the right and 3.5 cm on the left. The main pulmonary artery is normal in caliber. Mediastinum/Nodes: Small thyroid nodules do not require follow-up. No thoracic adenopathy by size. Unremarkable esophagus. Lungs/Pleura: Bibasilar atelectasis/scarring. Mild bronchial wall thickening and scattered mucous plugging. No pleural effusion or pneumothorax. Mild mosaic attenuation can be seen with air trapping. Musculoskeletal: No chest wall abnormality. No acute or significant osseous findings. Review of the MIP images confirms the above findings. CTA ABDOMEN AND PELVIS FINDINGS VASCULAR Aorta: Aortic atherosclerosis. No aneurysm, dissection, penetrating atherosclerotic ulcer, or intramural hematoma. Celiac: Calcified plaque at the origin causes moderate-advanced narrowing. SMA: Calcified plaque at the origin causes mild-moderate narrowing. Renals: Calcified plaque at the  origins of both renal arteries causes mild narrowing bilaterally. IMA: Patent. Inflow: Scattered calcified plaque without significant narrowing. No aneurysm or dissection. Veins: No obvious venous abnormality within the limitations of this arterial phase study. Review of the MIP images confirms the above findings. NON-VASCULAR Hepatobiliary: No focal liver abnormality is seen. No gallstones, gallbladder wall thickening, or biliary dilatation. Pancreas: Unremarkable. No pancreatic ductal dilatation or surrounding inflammatory changes. Spleen: Normal in size without focal abnormality. Adrenals/Urinary Tract: Unremarkable adrenal glands.  Bilateral cortical renal scarring. Nonobstructing left nephrolithiasis. Right nephrolithiasis or parenchymal calcification. No hydronephrosis. Intermediate density lesion in the upper pole of the left kidney. This was previously evaluated with ultrasound on 04/11/2015 demonstrating a simple cyst. Unremarkable bladder. Stomach/Bowel: Stomach is within normal limits. No evidence of bowel wall thickening, distention, or inflammatory changes. Colonic diverticulosis without diverticulitis. Lymphatic: No abdominopelvic lymphadenopathy. Reproductive: Unremarkable. Other: No free intraperitoneal fluid or air. Musculoskeletal: No acute osseous abnormality. Review of the MIP images confirms the above findings. IMPRESSION: 1. Negative for acute aortic syndrome. Aortic Atherosclerosis (ICD10-I70.0). 2. Cardiomegaly.  Small-moderate pericardial effusion. 3. Small airway infection/inflammation. 4. No acute abnormality in the abdomen or pelvis. 5. Bilateral nonobstructing nephrolithiasis. 6. Colonic diverticulosis without diverticulitis. Electronically Signed   By: Placido Sou M.D.   On: 03/13/2022 22:18    Microbiology: Results for orders placed or performed during the hospital encounter of 03/13/22  SARS Coronavirus 2 by RT PCR (hospital order, performed in [facility] hospital lab) *cepheid single result test* Anterior Nasal Swab     Status: None   Collection Time: 03/14/22  8:00 AM   Specimen: Anterior Nasal Swab  Result Value Ref Range Status   SARS Coronavirus 2 by RT PCR NEGATIVE NEGATIVE Final    Comment: (NOTE) SARS-CoV-2 target nucleic acids are NOT DETECTED.  The SARS-CoV-2 RNA is generally detectable in upper and lower respiratory specimens during the acute phase of infection. The lowest concentration of SARS-CoV-2 viral copies this assay can detect is 250 copies / mL. A negative result does not preclude SARS-CoV-2 infection and should not be used as the sole basis for treatment or other patient  management decisions.  A negative result may occur with improper specimen collection / handling, submission of specimen other than nasopharyngeal swab, presence of viral mutation(s) within the areas targeted by this assay, and inadequate number of viral copies (<250 copies / mL). A negative result must be combined with clinical observations, patient history, and epidemiological information.  Fact Sheet for Patients:   https://www.patel.info/  Fact Sheet for Healthcare Providers: https://hall.com/  This test is not yet approved or  cleared by the [place] FDA and has been authorized for detection and/or diagnosis of SARS-CoV-2 by FDA under an Emergency Use Authorization (EUA).  This EUA will remain in effect (meaning this test can be used) for the duration of the COVID-19 declaration under Section 564(b)(1) of the Act, 21 U.S.C. section 360bbb-3(b)(1), unless the authorization is terminated or revoked sooner.  Performed at [facility], [place] [address] [place], [place] [address]     Labs: CBC: Recent Labs  Lab 03/13/22 1912 03/15/22 0021  WBC 13.1* 7.2  NEUTROABS 9.9*  --   HGB 11.3* 10.3*  HCT 34.2* 31.8*  MCV 87.7 88.3  PLT 190 850   Basic Metabolic Panel: Recent Labs  Lab 03/13/22 1912 03/14/22 1021 03/15/22 0021  NA 136  --  135  K 3.2*  --  4.0  CL 100  --  101  CO2  25  --  25  GLUCOSE 125*  --  112*  BUN 16  --  12  CREATININE 0.76  --  0.58  CALCIUM 9.3  --  8.8*  MG  --  1.8  --    Liver Function Tests: Recent Labs  Lab 03/13/22 1912  AST 22  ALT 12  ALKPHOS 52  BILITOT 1.5*  PROT 6.9  ALBUMIN 3.9   CBG: No results for input(s): "GLUCAP" in the last 168 hours.  Discharge time spent: greater than 30 minutes.  Signed: Elmarie Shiley, MD [facility] 03/15/2022

## 2022-03-15 NOTE — Plan of Care (Signed)
  Problem: Activity: Goal: Ability to tolerate increased activity will improve Outcome: Progressing   Problem: Cardiac: Goal: Ability to achieve and maintain adequate cardiovascular perfusion will improve Outcome: Progressing   Problem: Health Behavior/Discharge Planning: Goal: Ability to safely manage health-related needs after discharge will improve Outcome: Progressing   Problem: Education: Goal: Knowledge of General Education information will improve Description: Including pain rating scale, medication(s)/side effects and non-pharmacologic comfort measures Outcome: Progressing   Problem: Health Behavior/Discharge Planning: Goal: Ability to manage health-related needs will improve Outcome: Progressing   Problem: Clinical Measurements: Goal: Ability to maintain clinical measurements within normal limits will improve Outcome: Progressing Goal: Respiratory complications will improve Outcome: Progressing Goal: Cardiovascular complication will be avoided Outcome: Progressing   Problem: Activity: Goal: Risk for activity intolerance will decrease Outcome: Progressing   Problem: Nutrition: Goal: Adequate nutrition will be maintained Outcome: Progressing   Problem: Coping: Goal: Level of anxiety will decrease Outcome: Progressing   Problem: Elimination: Goal: Will not experience complications related to bowel motility Outcome: Progressing Goal: Will not experience complications related to urinary retention Outcome: Progressing   Problem: Pain Managment: Goal: General experience of comfort will improve Outcome: Progressing   Problem: Safety: Goal: Ability to remain free from injury will improve Outcome: Progressing   Problem: Skin Integrity: Goal: Risk for impaired skin integrity will decrease Outcome: Progressing

## 2022-03-16 ENCOUNTER — Telehealth: Payer: Self-pay

## 2022-03-16 LAB — URINE CULTURE: Culture: >=100000 — AB

## 2022-03-16 NOTE — Telephone Encounter (Signed)
Transition Care Management Unsuccessful Follow-up Telephone Call  Date of discharge and from where:  [place] 03-15-22 Dx: dizziness and giddiness  Attempts:  1st Attempt  Reason for unsuccessful TCM follow-up call:  Unable to leave message   Juanda Crumble LPN [place] Direct Dial [phone]    Transition Care Management Unsuccessful Follow-up Telephone Call  Date of discharge and from where:    [place] 03-15-22 Dx: dizziness and giddiness    Attempts:  2nd Attempt  Reason for unsuccessful TCM follow-up call:  Unable to leave message   [place] Direct Dial [phone]

## 2022-03-18 NOTE — Telephone Encounter (Signed)
Transition Care Management Unsuccessful Follow-up Telephone Call  Date of discharge and from where:    [place] 03-15-22 Dx: dizziness and giddiness    Attempts:  3rd Attempt  Reason for unsuccessful TCM follow-up call:  Unable to leave message   [facility] [place] Direct Dial [phone]

## 2022-03-22 ENCOUNTER — Inpatient Hospital Stay: Payer: Medicare Other | Admitting: Internal Medicine

## 2022-03-23 ENCOUNTER — Ambulatory Visit (INDEPENDENT_AMBULATORY_CARE_PROVIDER_SITE_OTHER): Payer: Medicare Other | Admitting: Internal Medicine

## 2022-03-23 ENCOUNTER — Encounter: Payer: Self-pay | Admitting: Internal Medicine

## 2022-03-23 DIAGNOSIS — Z86711 Personal history of pulmonary embolism: Secondary | ICD-10-CM

## 2022-03-23 DIAGNOSIS — E876 Hypokalemia: Secondary | ICD-10-CM

## 2022-03-23 DIAGNOSIS — R42 Dizziness and giddiness: Secondary | ICD-10-CM | POA: Diagnosis not present

## 2022-03-23 LAB — CBC
HCT: 34.4 % — ABNORMAL LOW (ref 36.0–46.0)
Hemoglobin: 11.3 g/dL — ABNORMAL LOW (ref 12.0–15.0)
MCHC: 33 g/dL (ref 30.0–36.0)
MCV: 86.1 fl (ref 78.0–100.0)
Platelets: 306 10*3/uL (ref 150.0–400.0)
RBC: 4 Mil/uL (ref 3.87–5.11)
RDW: 14.4 % (ref 11.5–15.5)
WBC: 6.9 10*3/uL (ref 4.0–10.5)

## 2022-03-23 LAB — COMPREHENSIVE METABOLIC PANEL
ALT: 13 U/L (ref 0–35)
AST: 14 U/L (ref 0–37)
Albumin: 4.1 g/dL (ref 3.5–5.2)
Alkaline Phosphatase: 50 U/L (ref 39–117)
BUN: 12 mg/dL (ref 6–23)
CO2: 31 mEq/L (ref 19–32)
Calcium: 9.7 mg/dL (ref 8.4–10.5)
Chloride: 101 mEq/L (ref 96–112)
Creatinine, Ser: 0.62 mg/dL (ref 0.40–1.20)
GFR: 75.93 mL/min (ref >60.00)
Glucose, Bld: 105 mg/dL — ABNORMAL HIGH (ref 70–99)
Potassium: 4 mEq/L (ref 3.5–5.1)
Sodium: 137 mEq/L (ref 135–145)
Total Bilirubin: 0.4 mg/dL (ref 0.2–1.2)
Total Protein: 7.6 g/dL (ref 6.0–8.3)

## 2022-03-23 LAB — MAGNESIUM: Magnesium: 2 mg/dL (ref 1.5–2.5)

## 2022-03-23 NOTE — Assessment & Plan Note (Signed)
Needs recheck CMP ordered today as well as magnesium. Replete as needed. She was help on hctz in hospital but advised to resume on discharge.

## 2022-03-23 NOTE — Progress Notes (Signed)
   Subjective:   Patient ID: Samantha Bishop, female    DOB: 1925/02/21, 88 y.o.   MRN: [id number]  HPI The patient is a 88 YO female coming in for hospital follow up (in for dizziness related to dehydration and UTI, treated with fluids and antibiotics). She was then discharged back to her independent living once she was improving. She has been doing okay. Still low energy.   PMH, [facility], social history reviewed and updated  Review of Systems  Constitutional:  Positive for appetite change and fatigue.  HENT: Negative.    Eyes: Negative.   Respiratory:  Negative for cough, chest tightness and shortness of breath.   Cardiovascular:  Negative for chest pain, palpitations and leg swelling.  Gastrointestinal:  Negative for abdominal distention, abdominal pain, constipation, diarrhea, nausea and vomiting.  Musculoskeletal: Negative.   Skin: Negative.   Neurological: Negative.   Psychiatric/Behavioral: Negative.      Objective:  Physical Exam Constitutional:      Appearance: She is well-developed.  HENT:     Head: Normocephalic and atraumatic.  Cardiovascular:     Rate and Rhythm: Normal rate and regular rhythm.  Pulmonary:     Effort: Pulmonary effort is normal. No respiratory distress.     Breath sounds: Normal breath sounds. No wheezing or rales.  Abdominal:     General: Bowel sounds are normal. There is no distension.     Palpations: Abdomen is soft.     Tenderness: There is no abdominal tenderness. There is no rebound.  Musculoskeletal:     Cervical back: Normal range of motion.  Skin:    General: Skin is warm and dry.  Neurological:     Mental Status: She is alert and oriented to person, place, and time.     Coordination: Coordination normal.     Vitals:   03/23/22 1049  BP: 138/80  Pulse: 87  Temp: 98.4 F (36.9 C)  TempSrc: Oral  SpO2: 99%  Weight: 174 lb (78.9 kg)  Height: '5\' 7"'$  (1.702 m)    Assessment & Plan:

## 2022-03-23 NOTE — Assessment & Plan Note (Signed)
This is now resolved. Rechecking CBC and CMP today.

## 2022-03-23 NOTE — Assessment & Plan Note (Signed)
No clinical signs of bleeding continue eliquis 5 mg BID. Checking CBC.

## 2022-03-23 NOTE — Assessment & Plan Note (Signed)
Rechecking levels and adjust as needed. Given replacement in the hospital recently without recheck.

## 2022-03-23 NOTE — Patient Instructions (Signed)
We will recheck the labs today. 

## 2022-04-12 DIAGNOSIS — Z23 Encounter for immunization: Secondary | ICD-10-CM | POA: Diagnosis not present

## 2022-05-07 ENCOUNTER — Telehealth: Payer: Self-pay | Admitting: Internal Medicine

## 2022-05-07 MED ORDER — NITROFURANTOIN MONOHYD MACRO 100 MG PO CAPS: 100.0000 mg | ORAL_CAPSULE | Freq: Two times a day (BID) | ORAL | 0 refills | Status: DC

## 2022-05-07 NOTE — Telephone Encounter (Signed)
Patient called and is having symptoms of a UTI - she is burning when urinating - patient would like to know if you can call her something in to [facility].  Please call patient back and let her know if something can be called in.  Patient's number:  [phone]

## 2022-05-07 NOTE — Telephone Encounter (Signed)
Sent in macrobid.

## 2022-05-11 NOTE — Telephone Encounter (Signed)
Was unable to leave a voicemail. Patient hasn't set up voicemail.

## 2022-05-18 NOTE — Patient Instructions (Addendum)
    You do have covid and a UTI.      Medications changes include :   cephalexin 500 mg twice a day for one week for the UTI.  Molnupiravir for the [place].        Return if symptoms worsen or fail to improve.

## 2022-05-18 NOTE — Progress Notes (Unsigned)
    Subjective:    Patient ID: Samantha Bishop, female    DOB: 30-Mar-1925, 89 y.o.   MRN: [id number]      HPI Bristyn is here for No chief complaint on file.    UTI -     Cx 03/2022 - klebsiella - keflex ok, cipro ok  Medications and allergies reviewed with patient and updated if appropriate.  Current Outpatient Medications on File Prior to Visit  Medication Sig Dispense Refill   apixaban (ELIQUIS) 5 MG TABS tablet TAKE 1 TABLET (5 MG TOTAL) BY MOUTH 2 (TWO) TIMES DAILY. (Patient taking differently: Take 5 mg by mouth See admin instructions. TAKE 1 TABLET (5 MG TOTAL) BY MOUTH 2 (TWO) TIMES DAILY.) 180 tablet 3   escitalopram (LEXAPRO) 5 MG tablet Take 1 tablet (5 mg total) by mouth daily. 90 tablet 3   gabapentin (NEURONTIN) 300 MG capsule Take 2 capsules (600 mg total) by mouth 3 (three) times daily. 460 capsule 3   hydrochlorothiazide (MICROZIDE) 12.5 MG capsule TAKE 1 CAPSULE(12.5 MG) BY MOUTH DAILY (Patient taking differently: Take 12.5 mg by mouth daily.) 90 capsule 3   metoprolol tartrate (LOPRESSOR) 25 MG tablet Take 1 tablet (25 mg total) by mouth 2 (two) times daily. 30 tablet 0   Multiple Vitamin (MULTIVITAMIN WITH MINERALS) TABS tablet Take 1 tablet by mouth daily.     nitrofurantoin, macrocrystal-monohydrate, (MACROBID) 100 MG capsule Take 1 capsule (100 mg total) by mouth 2 (two) times daily. 14 capsule 0   pravastatin (PRAVACHOL) 20 MG tablet TAKE 1 TABLET(20 MG) BY MOUTH DAILY AT 6 PM (Patient taking differently: Take 20 mg by mouth every evening.) 90 tablet 3   sodium chloride (OCEAN) 0.65 % SOLN nasal spray Place 1 spray into both nostrils as needed for congestion. (Patient taking differently: Place 1 spray into both nostrils 2 (two) times daily as needed for congestion.) 60 mL 11   No current facility-administered medications on file prior to visit.    Review of Systems     Objective:  There were no vitals filed for this visit. BP Readings from Last 3  Encounters:  03/23/22 138/80  03/15/22 (!) 165/52  01/07/22 120/70   Wt Readings from Last 3 Encounters:  03/23/22 174 lb (78.9 kg)  03/14/22 176 lb 5.9 oz (80 kg)  01/07/22 173 lb (78.5 kg)   There is no height or weight on file to calculate BMI.    Physical Exam         Assessment & Plan:    See Problem List for Assessment and Plan of chronic medical problems.

## 2022-05-19 ENCOUNTER — Ambulatory Visit (INDEPENDENT_AMBULATORY_CARE_PROVIDER_SITE_OTHER): Payer: Medicare Other | Admitting: Internal Medicine

## 2022-05-19 ENCOUNTER — Telehealth: Payer: Self-pay | Admitting: Internal Medicine

## 2022-05-19 ENCOUNTER — Encounter: Payer: Self-pay | Admitting: Internal Medicine

## 2022-05-19 VITALS — BP 136/70 | HR 55 | Temp 98.5°F | Ht 67.0 in

## 2022-05-19 DIAGNOSIS — I10 Essential (primary) hypertension: Secondary | ICD-10-CM | POA: Diagnosis not present

## 2022-05-19 DIAGNOSIS — N3001 Acute cystitis with hematuria: Secondary | ICD-10-CM | POA: Diagnosis not present

## 2022-05-19 DIAGNOSIS — N3000 Acute cystitis without hematuria: Secondary | ICD-10-CM | POA: Insufficient documentation

## 2022-05-19 DIAGNOSIS — U071 COVID-19: Secondary | ICD-10-CM | POA: Insufficient documentation

## 2022-05-19 LAB — POC URINALSYSI DIPSTICK (AUTOMATED)
Bilirubin, UA: NEGATIVE
Glucose, UA: NEGATIVE
Ketones, UA: NEGATIVE
Nitrite, UA: NEGATIVE
Protein, UA: NEGATIVE
Spec Grav, UA: 1.01 (ref 1.010–1.025)
Urobilinogen, UA: 0.2 E.U./dL
pH, UA: 7 (ref 5.0–8.0)

## 2022-05-19 LAB — POC COVID19 BINAXNOW: SARS Coronavirus 2 Ag: POSITIVE — AB

## 2022-05-19 MED ORDER — CEPHALEXIN 500 MG PO CAPS: 500.0000 mg | ORAL_CAPSULE | Freq: Two times a day (BID) | ORAL | 0 refills | Status: DC

## 2022-05-19 MED ORDER — MOLNUPIRAVIR 200 MG PO CAPS: 4.0000 | ORAL_CAPSULE | Freq: Two times a day (BID) | ORAL | 0 refills | Status: DC

## 2022-05-19 NOTE — Telephone Encounter (Signed)
Pt saw Dr. Quay Burow today and was given an RX for cephALEXin (KEFLEX) 500 MG capsule which she can not take because she is allergic to penicillin.   Pt is requesting that we send a different RX to [id number]   Phone: [phone]  Fax: [phone]

## 2022-05-19 NOTE — Telephone Encounter (Signed)
Tried to call pt to discuss - VM not working   She took the [employer] in November and did ok with it.  She has also taken it a few times in the past.  Often people can still take this despite a penicillin allergy

## 2022-05-20 MED ORDER — NIRMATRELVIR/RITONAVIR (PAXLOVID)TABLET: 3.0000 | ORAL_TABLET | Freq: Two times a day (BID) | ORAL | 0 refills | Status: AC

## 2022-05-20 NOTE — Telephone Encounter (Signed)
Spoke with the pt and was able to relay Dr. Nathanial Millman instructions. Pt state she understood and will do as instructed.

## 2022-05-20 NOTE — Telephone Encounter (Signed)
Tried to contact patient this morning but no answer and voicemail.

## 2022-05-20 NOTE — Telephone Encounter (Signed)
Sent in paxlovid and needs to stop pravastatin while taking this.

## 2022-05-20 NOTE — Telephone Encounter (Signed)
Spoke with the pt and was able to clarify the advice about the ABX.  **Pt has stated the pharmacy was charging her over $500 for the anti-viral molnupiravir EUA (LAGEVRIO) 200 MG CAPS capsule . I was able to speak with the pharmacist and they stated the anti-viral that was sent in is $522 due to pt not meeting her deductible as of yet. However, the Paxlovid is covered 100% as long as there are no concerns about the pts kidney's.

## 2022-05-21 ENCOUNTER — Telehealth: Payer: Self-pay | Admitting: Internal Medicine

## 2022-05-21 NOTE — Telephone Encounter (Signed)
I was able to speak with the pt and she has informed me she has not started to take the Paxlovid as of yet. Pt states she has not slept due to the nasal congestion and over all feeling of being sick.  **I advise pt to start taking the Paxlovid to help with the sxs of [place]. She stated she understood and will start to take it today.

## 2022-05-21 NOTE — Telephone Encounter (Signed)
Patient started her paxlovid and states that she did not sleep at all last night.  Please advise what she needs to do  Patient's #  [phone]

## 2022-05-21 NOTE — Telephone Encounter (Signed)
Did she not sleep because of taking paxlovid or because of covid-19 symptoms? If having severe side effects of paxlovid okay to stop. Otherwise I would recommend to finish.

## 2022-05-22 LAB — CULTURE, URINE COMPREHENSIVE

## 2022-05-24 ENCOUNTER — Telehealth: Payer: Self-pay | Admitting: Internal Medicine

## 2022-05-24 NOTE — Telephone Encounter (Signed)
Patient states that she saw Dr. Quay Burow last week and Dr. Quay Burow prescribed medication that is not working. Requesting a call back at [phone].

## 2022-05-24 NOTE — Telephone Encounter (Signed)
Spoke with patient today and encouraged her to finish out her abx prescribed by Dr. Quay Burow.   Patient voiced understanding and will follow up if not better.

## 2022-05-27 NOTE — Telephone Encounter (Signed)
Informed patient about the symptoms and to give it about 1-2 weeks for the symptoms to get better . Patient states that she hs taking all of her medication and staying hydrated and well rested and eating.

## 2022-05-27 NOTE — Telephone Encounter (Signed)
Pt has called and states that she has taken all of her paxlovid RX but is still not feeling any better. Says her bowels are watery, still has nasal congestion and isn't sleeping well.   Pt wants to know if theres anything she should do at home, or if she should come in for an appointment.   Please advise.

## 2022-05-27 NOTE — Telephone Encounter (Signed)
Covid-19 symptoms can last 1-2 weeks after paxlovid. Paxlovid is intended to prevent worsening and lower risk of being admitted to the hospital. Make sure to hydrate well to make up for water lost in diarrhea. If worsening or not improving within 2 weeks of onset of symptoms let us know or return.

## 2022-05-29 ENCOUNTER — Emergency Department (HOSPITAL_COMMUNITY): Payer: Medicare Other

## 2022-05-29 ENCOUNTER — Inpatient Hospital Stay (HOSPITAL_COMMUNITY)
Admission: EM | Admit: 2022-05-29 | Discharge: 2022-05-31 | DRG: 178 | Disposition: A | Discharge status: Home Health | Payer: Medicare Other | Attending: Obstetrics and Gynecology | Admitting: Obstetrics and Gynecology

## 2022-05-29 DIAGNOSIS — Z86010 Personal history of colonic polyps: Secondary | ICD-10-CM | POA: Diagnosis not present

## 2022-05-29 DIAGNOSIS — Z885 Allergy status to narcotic agent status: Secondary | ICD-10-CM | POA: Diagnosis not present

## 2022-05-29 DIAGNOSIS — Z880 Allergy status to penicillin: Secondary | ICD-10-CM | POA: Diagnosis not present

## 2022-05-29 DIAGNOSIS — I159 Secondary hypertension, unspecified: Secondary | ICD-10-CM | POA: Diagnosis present

## 2022-05-29 DIAGNOSIS — Z8673 Personal history of transient ischemic attack (TIA), and cerebral infarction without residual deficits: Secondary | ICD-10-CM | POA: Diagnosis not present

## 2022-05-29 DIAGNOSIS — Z90710 Acquired absence of both cervix and uterus: Secondary | ICD-10-CM

## 2022-05-29 DIAGNOSIS — F411 Generalized anxiety disorder: Secondary | ICD-10-CM | POA: Diagnosis present

## 2022-05-29 DIAGNOSIS — G629 Polyneuropathy, unspecified: Secondary | ICD-10-CM | POA: Diagnosis present

## 2022-05-29 DIAGNOSIS — I4821 Permanent atrial fibrillation: Secondary | ICD-10-CM | POA: Diagnosis present

## 2022-05-29 DIAGNOSIS — E538 Deficiency of other specified B group vitamins: Secondary | ICD-10-CM | POA: Diagnosis present

## 2022-05-29 DIAGNOSIS — I10 Essential (primary) hypertension: Secondary | ICD-10-CM | POA: Diagnosis not present

## 2022-05-29 DIAGNOSIS — R410 Disorientation, unspecified: Secondary | ICD-10-CM | POA: Diagnosis not present

## 2022-05-29 DIAGNOSIS — Z96652 Presence of left artificial knee joint: Secondary | ICD-10-CM | POA: Diagnosis present

## 2022-05-29 DIAGNOSIS — Z8672 Personal history of thrombophlebitis: Secondary | ICD-10-CM

## 2022-05-29 DIAGNOSIS — Z85828 Personal history of other malignant neoplasm of skin: Secondary | ICD-10-CM | POA: Diagnosis not present

## 2022-05-29 DIAGNOSIS — Z823 Family history of stroke: Secondary | ICD-10-CM

## 2022-05-29 DIAGNOSIS — Z87442 Personal history of urinary calculi: Secondary | ICD-10-CM

## 2022-05-29 DIAGNOSIS — E785 Hyperlipidemia, unspecified: Secondary | ICD-10-CM | POA: Diagnosis present

## 2022-05-29 DIAGNOSIS — Z1629 Resistance to other single specified antibiotic: Secondary | ICD-10-CM | POA: Diagnosis present

## 2022-05-29 DIAGNOSIS — N390 Urinary tract infection, site not specified: Secondary | ICD-10-CM | POA: Diagnosis not present

## 2022-05-29 DIAGNOSIS — Z86711 Personal history of pulmonary embolism: Secondary | ICD-10-CM

## 2022-05-29 DIAGNOSIS — G9341 Metabolic encephalopathy: Secondary | ICD-10-CM | POA: Diagnosis not present

## 2022-05-29 DIAGNOSIS — R4182 Altered mental status, unspecified: Principal | ICD-10-CM

## 2022-05-29 DIAGNOSIS — G4730 Sleep apnea, unspecified: Secondary | ICD-10-CM | POA: Diagnosis present

## 2022-05-29 DIAGNOSIS — F4323 Adjustment disorder with mixed anxiety and depressed mood: Secondary | ICD-10-CM | POA: Diagnosis present

## 2022-05-29 DIAGNOSIS — K921 Melena: Secondary | ICD-10-CM | POA: Diagnosis present

## 2022-05-29 DIAGNOSIS — R404 Transient alteration of awareness: Secondary | ICD-10-CM | POA: Diagnosis not present

## 2022-05-29 DIAGNOSIS — H9110 Presbycusis, unspecified ear: Secondary | ICD-10-CM | POA: Diagnosis present

## 2022-05-29 DIAGNOSIS — Z7901 Long term (current) use of anticoagulants: Secondary | ICD-10-CM

## 2022-05-29 DIAGNOSIS — B961 Klebsiella pneumoniae [K. pneumoniae] as the cause of diseases classified elsewhere: Secondary | ICD-10-CM | POA: Diagnosis present

## 2022-05-29 DIAGNOSIS — R6889 Other general symptoms and signs: Secondary | ICD-10-CM | POA: Diagnosis not present

## 2022-05-29 DIAGNOSIS — U071 COVID-19: Principal | ICD-10-CM | POA: Diagnosis present

## 2022-05-29 DIAGNOSIS — B964 Proteus (mirabilis) (morganii) as the cause of diseases classified elsewhere: Secondary | ICD-10-CM | POA: Diagnosis present

## 2022-05-29 DIAGNOSIS — I499 Cardiac arrhythmia, unspecified: Secondary | ICD-10-CM | POA: Diagnosis not present

## 2022-05-29 DIAGNOSIS — Z8249 Family history of ischemic heart disease and other diseases of the circulatory system: Secondary | ICD-10-CM

## 2022-05-29 DIAGNOSIS — Z743 Need for continuous supervision: Secondary | ICD-10-CM | POA: Diagnosis not present

## 2022-05-29 DIAGNOSIS — I493 Ventricular premature depolarization: Secondary | ICD-10-CM | POA: Diagnosis present

## 2022-05-29 DIAGNOSIS — G4489 Other headache syndrome: Secondary | ICD-10-CM | POA: Diagnosis not present

## 2022-05-29 DIAGNOSIS — Z79899 Other long term (current) drug therapy: Secondary | ICD-10-CM

## 2022-05-29 LAB — CBC WITH DIFFERENTIAL/PLATELET
Abs Immature Granulocytes: 0.02 10*3/uL (ref 0.00–0.07)
Basophils Absolute: 0.1 10*3/uL (ref 0.0–0.1)
Basophils Relative: 1 %
Eosinophils Absolute: 0 10*3/uL (ref 0.0–0.5)
Eosinophils Relative: 1 %
HCT: 36.8 % (ref 36.0–46.0)
Hemoglobin: 12.1 g/dL (ref 12.0–15.0)
Immature Granulocytes: 0 %
Lymphocytes Relative: 23 %
Lymphs Abs: 2 10*3/uL (ref 0.7–4.0)
MCH: 28.9 pg (ref 26.0–34.0)
MCHC: 32.9 g/dL (ref 30.0–36.0)
MCV: 88 fL (ref 80.0–100.0)
Monocytes Absolute: 0.7 10*3/uL (ref 0.1–1.0)
Monocytes Relative: 8 %
Neutro Abs: 5.8 10*3/uL (ref 1.7–7.7)
Neutrophils Relative %: 67 %
Platelets: 252 10*3/uL (ref 150–400)
RBC: 4.18 MIL/uL (ref 3.87–5.11)
RDW: 14 % (ref 11.5–15.5)
WBC: 8.6 10*3/uL (ref 4.0–10.5)
nRBC: 0 % (ref 0.0–0.2)

## 2022-05-29 LAB — I-STAT VENOUS BLOOD GAS, ED
Acid-Base Excess: 1 mmol/L (ref 0.0–2.0)
Bicarbonate: 23.7 mmol/L (ref 20.0–28.0)
Calcium, Ion: 1.18 mmol/L (ref 1.15–1.40)
HCT: 36 % (ref 36.0–46.0)
Hemoglobin: 12.2 g/dL (ref 12.0–15.0)
O2 Saturation: 99 %
Potassium: 3.4 mmol/L — ABNORMAL LOW (ref 3.5–5.1)
Sodium: 141 mmol/L (ref 135–145)
TCO2: 25 mmol/L (ref 22–32)
pCO2, Ven: 30.6 mmHg — ABNORMAL LOW (ref 44–60)
pH, Ven: 7.498 — ABNORMAL HIGH (ref 7.25–7.43)
pO2, Ven: 120 mmHg — ABNORMAL HIGH (ref 32–45)

## 2022-05-29 LAB — URINALYSIS, ROUTINE W REFLEX MICROSCOPIC
Bilirubin Urine: NEGATIVE
Glucose, UA: NEGATIVE mg/dL
Ketones, ur: NEGATIVE mg/dL
Nitrite: NEGATIVE
Protein, ur: NEGATIVE mg/dL
Specific Gravity, Urine: 1.006 (ref 1.005–1.030)
WBC, UA: >50 WBC/hpf — ABNORMAL HIGH (ref 0–5)
pH: 7 (ref 5.0–8.0)

## 2022-05-29 LAB — COMPREHENSIVE METABOLIC PANEL
ALT: 12 U/L (ref 0–44)
AST: 21 U/L (ref 15–41)
Albumin: 4.1 g/dL (ref 3.5–5.0)
Alkaline Phosphatase: 48 U/L (ref 38–126)
Anion gap: 12 (ref 5–15)
BUN: 8 mg/dL (ref 8–23)
CO2: 23 mmol/L (ref 22–32)
Calcium: 9.8 mg/dL (ref 8.9–10.3)
Chloride: 103 mmol/L (ref 98–111)
Creatinine, Ser: 0.58 mg/dL (ref 0.44–1.00)
GFR, Estimated: >60 mL/min (ref >60)
Glucose, Bld: 108 mg/dL — ABNORMAL HIGH (ref 70–99)
Potassium: 3.6 mmol/L (ref 3.5–5.1)
Sodium: 138 mmol/L (ref 135–145)
Total Bilirubin: 0.2 mg/dL — ABNORMAL LOW (ref 0.3–1.2)
Total Protein: 7.8 g/dL (ref 6.5–8.1)

## 2022-05-29 LAB — RAPID URINE DRUG SCREEN, HOSP PERFORMED
Amphetamines: NOT DETECTED
Barbiturates: NOT DETECTED
Benzodiazepines: NOT DETECTED
Cocaine: NOT DETECTED
Opiates: NOT DETECTED
Tetrahydrocannabinol: NOT DETECTED

## 2022-05-29 LAB — RESP PANEL BY RT-PCR (RSV, FLU A&B, COVID)  RVPGX2
Influenza A by PCR: NEGATIVE
Influenza B by PCR: NEGATIVE
Resp Syncytial Virus by PCR: NEGATIVE
SARS Coronavirus 2 by RT PCR: POSITIVE — AB

## 2022-05-29 LAB — BRAIN NATRIURETIC PEPTIDE: B Natriuretic Peptide: 196.2 pg/mL — ABNORMAL HIGH (ref 0.0–100.0)

## 2022-05-29 LAB — CBG MONITORING, ED: Glucose-Capillary: 100 mg/dL — ABNORMAL HIGH (ref 70–99)

## 2022-05-29 LAB — TROPONIN I (HIGH SENSITIVITY): Troponin I (High Sensitivity): 12 ng/L (ref <18)

## 2022-05-29 LAB — AMMONIA: Ammonia: 19 umol/L (ref 9–35)

## 2022-05-29 MED ORDER — ONDANSETRON HCL 4 MG/2ML IJ SOLN
4.0000 mg | Freq: Four times a day (QID) | INTRAMUSCULAR | Status: DC | PRN
  Administered 2022-05-30: 4 mg via INTRAVENOUS
  Filled 2022-05-29: qty 2

## 2022-05-29 MED ORDER — ACETAMINOPHEN 650 MG RE SUPP
650.0000 mg | Freq: Four times a day (QID) | RECTAL | Status: DC | PRN
  Administered 2022-05-30: 650 mg via RECTAL
  Filled 2022-05-29: qty 1

## 2022-05-29 MED ORDER — HYDRALAZINE HCL 20 MG/ML IJ SOLN
10.0000 mg | INTRAMUSCULAR | Status: DC | PRN
  Administered 2022-05-29: 10 mg via INTRAVENOUS
  Filled 2022-05-29: qty 1

## 2022-05-29 MED ORDER — ACETAMINOPHEN 325 MG PO TABS
650.0000 mg | ORAL_TABLET | Freq: Four times a day (QID) | ORAL | Status: DC | PRN
  Administered 2022-05-29: 650 mg via ORAL
  Filled 2022-05-29: qty 2

## 2022-05-29 MED ORDER — SENNOSIDES-DOCUSATE SODIUM 8.6-50 MG PO TABS: 1.0000 | ORAL_TABLET | Freq: Every evening | ORAL | Status: DC | PRN

## 2022-05-29 MED ORDER — LORAZEPAM 0.5 MG PO TABS: 0.5000 mg | ORAL_TABLET | ORAL | Status: DC | PRN

## 2022-05-29 MED ORDER — SODIUM CHLORIDE 0.9% FLUSH
3.0000 mL | Freq: Two times a day (BID) | INTRAVENOUS | Status: DC
  Administered 2022-05-30: 3 mL via INTRAVENOUS

## 2022-05-29 MED ORDER — ONDANSETRON HCL 4 MG PO TABS: 4.0000 mg | ORAL_TABLET | Freq: Four times a day (QID) | ORAL | Status: DC | PRN

## 2022-05-29 MED ORDER — GABAPENTIN 300 MG PO CAPS: 600.0000 mg | ORAL_CAPSULE | Freq: Three times a day (TID) | ORAL | Status: DC

## 2022-05-29 MED ORDER — LORAZEPAM 1 MG PO TABS
1.0000 mg | ORAL_TABLET | ORAL | Status: AC | PRN
  Administered 2022-05-30: 1 mg via ORAL
  Filled 2022-05-29: qty 1

## 2022-05-29 MED ORDER — LACTATED RINGERS IV SOLN
INTRAVENOUS | Status: AC
  Administered 2022-05-29: 1000 mL via INTRAVENOUS

## 2022-05-29 MED ORDER — ESCITALOPRAM OXALATE 10 MG PO TABS
5.0000 mg | ORAL_TABLET | Freq: Every day | ORAL | Status: DC
  Administered 2022-05-30 – 2022-05-31 (×2): 5 mg via ORAL
  Filled 2022-05-29 (×2): qty 1

## 2022-05-29 MED ORDER — SODIUM CHLORIDE 0.9 % IV SOLN
1.0000 g | Freq: Once | INTRAVENOUS | Status: AC
  Administered 2022-05-30: 1 g via INTRAVENOUS
  Filled 2022-05-29: qty 10

## 2022-05-29 MED ORDER — METOPROLOL TARTRATE 25 MG PO TABS
25.0000 mg | ORAL_TABLET | Freq: Two times a day (BID) | ORAL | Status: DC
  Administered 2022-05-30 – 2022-05-31 (×3): 25 mg via ORAL
  Filled 2022-05-29 (×3): qty 1

## 2022-05-29 MED ORDER — SODIUM CHLORIDE 0.9 % IV SOLN
1.0000 g | Freq: Once | INTRAVENOUS | Status: AC
  Administered 2022-05-29: 1 g via INTRAVENOUS
  Filled 2022-05-29: qty 10

## 2022-05-29 MED ORDER — PRAVASTATIN SODIUM 40 MG PO TABS
20.0000 mg | ORAL_TABLET | Freq: Every evening | ORAL | Status: DC
  Administered 2022-05-29 – 2022-05-30 (×2): 20 mg via ORAL
  Filled 2022-05-29: qty 2
  Filled 2022-05-29: qty 1

## 2022-05-29 MED ORDER — APIXABAN 5 MG PO TABS
5.0000 mg | ORAL_TABLET | Freq: Two times a day (BID) | ORAL | Status: DC
  Administered 2022-05-29 – 2022-05-31 (×4): 5 mg via ORAL
  Filled 2022-05-29 (×4): qty 1

## 2022-05-29 NOTE — ED Triage Notes (Signed)
Patient bib PTAR from [facility]. Per ems facility called out saying patient is confused but A&Ox3 with bright red rectal bleeding. Patient complains of burning with urination and is on antibiotics. Patient had covid 2 weeks ago stopped eliquis recently started back. VSS

## 2022-05-29 NOTE — Assessment & Plan Note (Signed)
BP elevated on admission.  Allowing permissive hypertension for now pending MRI brain.  IV hydralazine as needed.

## 2022-05-29 NOTE — Assessment & Plan Note (Signed)
Continue Lexapro

## 2022-05-29 NOTE — Hospital Course (Signed)
Samantha Bishop is a 89 y.o. female with medical history significant for permanent atrial fibrillation on Eliquis, history of CVA, HTN, HLD, anxiety who is admitted with acute metabolic encephalopathy.

## 2022-05-29 NOTE — H&P (Signed)
History and Physical    Samantha Bishop NGE:[id number] DOB: [birth year] DOA: 05/29/2022  PCP: Hoyt Koch, MD  Patient coming from: ALF  I have personally briefly reviewed patient's old medical records in [place]  Chief Complaint: Altered mental status  HPI: Samantha Bishop is a 89 y.o. female with medical history significant for permanent atrial fibrillation on Eliquis, history of CVA, HTN, HLD, anxiety who presented to the ED from [facility] assisted living facility for altered mental status.  Patient was seen by her PCP on 05/19/2022 with dysuria and URI symptoms.  She was found to have a UTI and tested positive for COVID.  She was started on 7-day course of Keflex and completed course of Paxlovid.  Urine culture grew Proteus resistant to nitrofurantoin but otherwise sensitive.  Patient held Eliquis until Paxlovid course was completed and has since restarted.  Patient states that she has been isolating due to the [place] outbreak at her facility.  She has just been feeling increasingly lethargic and generally weak.  She reports urinary frequency.  She had reported dysuria earlier.  She says she saw some small spots of blood on toilet paper after bowel movement but no large bloody bowel movements.  She does report a history of hemorrhoids.  She was apparently confused at her facility and sent to the ED for further evaluation.  ED Course  Labs/Imaging on admission: I have personally reviewed following labs and imaging studies.  Initial vitals showed BP 200/76, pulse 86, RR 14, temp 98.4 F, SpO2 100% on room air.  Labs showed WBC 8.6, hemoglobin 12.1, platelets 252,000, sodium 138, potassium 3.6, bicarb 23, BUN 8, creatinine 0.58, serum glucose 108, ammonia 19, BNP 196.2, troponin 12.  UA shows negative nitrites, large leukocytes, 0-5 RBCs, >50 WBCs, few bacteria on microscopy.  Blood and urine cultures in process.  UDS negative.  Formal chest x-ray shows cardiomegaly  without focal consolidation, edema, effusion.  CT head without contrast negative for acute intracranial normalities.  Old right parietal and left PCA territory infarcts noted.  Patient was given IV ceftriaxone.  MRI brain was ordered and pending.  The hospitalist service was consulted to admit for further evaluation and management.  Review of Systems: All systems reviewed and are negative except as documented in history of present illness above.   Past Medical History:  Diagnosis Date   Anxiety state, unspecified    Calculus of kidney    Cerebral aneurysm, nonruptured    Colon polyp    Complication of anesthesia    "my heart stopped" 2004 knee  surg - see note on chart   DEEP VENOUS THROMBOPHLEBITIS 08/15/2007   Qualifier: History of  By: Lenna Gilford MD, Deborra Medina    Diverticulosis of colon (without mention of hemorrhage)    History of skin cancer    HTN (hypertension)    Hyperlipidemia    Lumbago    Osteoarthrosis, unspecified whether generalized or localized, unspecified site    Permanent atrial fibrillation ([place])    Pulmonary emboli ([place])    following remote knee arthroscopy surgery   Ruptured lumbar disc    Sleep apnea    stop bang score 4    Unspecified hemorrhoids without mention of complication     Past Surgical History:  Procedure Laterality Date   ABDOMINAL HYSTERECTOMY     APPENDECTOMY     BLOCKED INTESTINE SURGERY     BREAST CYST EXCISION     ESOPHAGOGASTRODUODENOSCOPY (EGD) WITH PROPOFOL N/A 05/30/2019  Procedure: ESOPHAGOGASTRODUODENOSCOPY (EGD) WITH PROPOFOL;  Surgeon: Thornton Park, MD;  Location: [facility];  Service: Gastroenterology;  Laterality: N/A;   HEMHORROIDECTOMY     JOINT REPLACEMENT  2004   RT TOTAL KNEE   KNEE ARTHROSCOPY     X2 L KNEE   KNEE SURGERY     NASAL SURGERY X2     TONSILLECTOMY AND ADENOIDECTOMY     TOTAL KNEE ARTHROPLASTY  09/03/2011   Procedure: TOTAL KNEE ARTHROPLASTY;  Surgeon: Gearlean Alf, MD;  Location: [facility];  Service:  Orthopedics;  Laterality: Left;    Social History:  reports that she has never smoked. She has never used smokeless tobacco. She reports that she does not currently use alcohol. She reports that she does not use drugs.  Allergies  Allergen Reactions   Codeine Nausea Only   Penicillins Rash    Family History  Problem Relation Age of Onset   Coronary artery disease Father    Stroke Father    Cirrhosis Brother    Colon polyps Neg Hx    Kidney disease Neg Hx    Diabetes Neg Hx      Prior to Admission medications   Medication Sig Start Date End Date Taking? Authorizing Provider  apixaban (ELIQUIS) 5 MG TABS tablet TAKE 1 TABLET (5 MG TOTAL) BY MOUTH 2 (TWO) TIMES DAILY. Patient taking differently: Take 5 mg by mouth See admin instructions. TAKE 1 TABLET (5 MG TOTAL) BY MOUTH 2 (TWO) TIMES DAILY. 03/08/22   Hoyt Koch, MD  cephALEXin (KEFLEX) 500 MG capsule Take 1 capsule (500 mg total) by mouth 2 (two) times daily. 05/19/22   Binnie Rail, MD  escitalopram (LEXAPRO) 5 MG tablet Take 1 tablet (5 mg total) by mouth daily. 03/10/22   Hoyt Koch, MD  gabapentin (NEURONTIN) 300 MG capsule Take 2 capsules (600 mg total) by mouth 3 (three) times daily. 03/08/22   Hoyt Koch, MD  hydrochlorothiazide (MICROZIDE) 12.5 MG capsule TAKE 1 CAPSULE(12.5 MG) BY MOUTH DAILY Patient taking differently: Take 12.5 mg by mouth daily. 03/08/22   Hoyt Koch, MD  metoprolol tartrate (LOPRESSOR) 25 MG tablet Take 1 tablet (25 mg total) by mouth 2 (two) times daily. 03/15/22   Regalado, Belkys A, MD  Multiple Vitamin (MULTIVITAMIN WITH MINERALS) TABS tablet Take 1 tablet by mouth daily.    [provider]  pravastatin (PRAVACHOL) 20 MG tablet TAKE 1 TABLET(20 MG) BY MOUTH DAILY AT 6 PM Patient taking differently: Take 20 mg by mouth every evening. 03/08/22   Hoyt Koch, MD  sodium chloride (OCEAN) 0.65 % SOLN nasal spray Place 1 spray into both  nostrils as needed for congestion. Patient taking differently: Place 1 spray into both nostrils 2 (two) times daily as needed for congestion. 07/24/19   Hoyt Koch, MD    Physical Exam: Vitals:   05/29/22 2030 05/29/22 2115 05/29/22 2200 05/29/22 2205  BP: (!) 157/93 (!) 178/94 (!) 171/92   Pulse: 79 83 89   Resp: '14 19 15   '$ Temp:    98.4 F (36.9 C)  TempSrc:    Oral  SpO2: 98% 97% 95%    Constitutional: Elderly woman resting supine in bed.  NAD, calm, comfortable Eyes: EOMI, lids and conjunctivae normal ENMT: Mucous membranes are moist. Posterior pharynx clear of any exudate or lesions.Normal dentition.  Neck: normal, supple, no masses. Respiratory: clear to auscultation bilaterally, no wheezing, no crackles. Normal respiratory effort. No accessory muscle use.  Cardiovascular: Regular rate and rhythm, no murmurs / rubs / gallops. No extremity edema. 2+ pedal pulses. Abdomen: Mild suprapubic tenderness, no masses palpated. No hepatosplenomegaly. Bowel sounds positive.  Musculoskeletal: no clubbing / cyanosis. No joint deformity upper and lower extremities.  Skin: no rashes, lesions, ulcers. No induration Neurologic: CN 2-12 grossly intact. Sensation intact. Strength 5/5 in all 4.  Psychiatric: Normal judgment and insight. Alert and oriented to person, place, but not year.  Normal mood.  EKG: Personally reviewed. Atrial fibrillation, rate 87, PVCs present.  PVCs new when compared to prior.  Assessment/Plan Principal Problem:   Acute metabolic encephalopathy Active Problems:   Permanent atrial fibrillation (HCC)   Essential hypertension   Hyperlipidemia   Adjustment disorder with mixed anxiety and depressed mood   Samantha Bishop is a 89 y.o. female with medical history significant for permanent atrial fibrillation on Eliquis, history of CVA, HTN, HLD, anxiety who is admitted with acute metabolic encephalopathy.  Assessment and Plan: * Acute metabolic  encephalopathy Reportedly confused prior to arrival, seems to be close to her baseline mental status at time of admission.  Recently diagnosed with Proteus UTI and COVID viral infection s/p treatment with Keflex and Paxlovid respectively.  Still reports some urinary symptoms.  Has been otherwise deconditioned with fatigue and generalized weakness. -Continue empiric IV ceftriaxone for now -Follow urine culture and blood cultures -Respiratory panel -IV fluid hydration overnight -Follow MRI brain -PT/OT eval  Permanent atrial fibrillation ([place]) Remains in atrial fibrillation with controlled rate.  Will resume Lopressor for rate control.  Continue Eliquis.  Essential hypertension BP elevated on admission.  Allowing permissive hypertension for now pending MRI brain.  IV hydralazine as needed.  Adjustment disorder with mixed anxiety and depressed mood Continue Lexapro.  Hyperlipidemia Continue pravastatin.  DVT prophylaxis:  apixaban (ELIQUIS) tablet 5 mg   Code Status: Full code, confirmed with patient on admission Family Communication: Discussed with patient, she has discussed with family Disposition Plan: From ALF and likely discharge to same facility pending clinical progress Consults called: None Severity of Illness: The appropriate patient status for this patient is OBSERVATION. Observation status is judged to be reasonable and necessary in order to provide the required intensity of service to ensure the patient's safety. The patient's presenting symptoms, physical exam findings, and initial radiographic and laboratory data in the context of their medical condition is felt to place them at decreased risk for further clinical deterioration. Furthermore, it is anticipated that the patient will be medically stable for discharge from the hospital within 2 midnights of admission.   Zada Finders MD [facility]  If 7PM-7AM, please contact night-coverage [email or web address]  05/29/2022,  10:29 PM

## 2022-05-29 NOTE — Assessment & Plan Note (Signed)
Reportedly confused prior to arrival, seems to be close to her baseline mental status at time of admission.  Recently diagnosed with Proteus UTI and COVID viral infection s/p treatment with Keflex and Paxlovid respectively.  Still reports some urinary symptoms.  Has been otherwise deconditioned with fatigue and generalized weakness. -Continue empiric IV ceftriaxone for now -Follow urine culture and blood cultures -Respiratory panel -IV fluid hydration overnight -Follow MRI brain -PT/OT eval

## 2022-05-29 NOTE — ED Provider Notes (Signed)
[facility] Provider Note   CSN: [id number] Arrival date & time: 05/29/22  1514     History  Chief Complaint  Patient presents with   Altered Mental Status   Rectal Bleeding    SONDRA BLIXT is a 89 y.o. female.  HPI   89 year old female with recent diagnosis of COVID-19 who presents to the emergency department from [facility] assisted living for confusion in the setting of UTI.  Patient reportedly had bright red rectal bleeding.  She been complaining of persistent burning while urinating.  No report of neurologic deficit.  On arrival, the patient was alert and oriented x 1 only, no family members immediate available for further history.  Patient unable to provide any further history of present illness.  Home Medications Prior to Admission medications   Medication Sig Start Date End Date Taking? Authorizing Provider  apixaban (ELIQUIS) 5 MG TABS tablet TAKE 1 TABLET (5 MG TOTAL) BY MOUTH 2 (TWO) TIMES DAILY. Patient taking differently: Take 5 mg by mouth See admin instructions. TAKE 1 TABLET (5 MG TOTAL) BY MOUTH 2 (TWO) TIMES DAILY. 03/08/22   Hoyt Koch, MD  escitalopram (LEXAPRO) 5 MG tablet Take 1 tablet (5 mg total) by mouth daily. 03/10/22   Hoyt Koch, MD  gabapentin (NEURONTIN) 300 MG capsule Take 2 capsules (600 mg total) by mouth 3 (three) times daily. 03/08/22   Hoyt Koch, MD  hydrochlorothiazide (MICROZIDE) 12.5 MG capsule TAKE 1 CAPSULE(12.5 MG) BY MOUTH DAILY Patient taking differently: Take 12.5 mg by mouth daily. 03/08/22   Hoyt Koch, MD  metoprolol tartrate (LOPRESSOR) 25 MG tablet Take 1 tablet (25 mg total) by mouth 2 (two) times daily. 03/15/22   Regalado, Belkys A, MD  Multiple Vitamin (MULTIVITAMIN WITH MINERALS) TABS tablet Take 1 tablet by mouth daily.    [provider]  pravastatin (PRAVACHOL) 20 MG tablet TAKE 1 TABLET(20 MG) BY MOUTH DAILY AT 6 PM Patient  taking differently: Take 20 mg by mouth every evening. 03/08/22   Hoyt Koch, MD  sodium chloride (OCEAN) 0.65 % SOLN nasal spray Place 1 spray into both nostrils as needed for congestion. Patient taking differently: Place 1 spray into both nostrils 2 (two) times daily as needed for congestion. 07/24/19   Hoyt Koch, MD      Allergies    Codeine and Penicillins    Review of Systems   Review of Systems  Unable to perform ROS: Mental status change    Physical Exam Updated Vital Signs BP (!) 199/90   Pulse 89   Temp 98.4 F (36.9 C) (Oral)   Resp 15   SpO2 95%  Physical Exam Vitals and nursing note reviewed. Exam conducted with a chaperone present.  Constitutional:      General: She is not in acute distress.    Appearance: She is well-developed.     Comments: AAOx1, GCS 15  HENT:     Head: Normocephalic and atraumatic.  Eyes:     Conjunctiva/sclera: Conjunctivae normal.  Cardiovascular:     Rate and Rhythm: Normal rate and regular rhythm.     Pulses: Normal pulses.  Pulmonary:     Effort: Pulmonary effort is normal. No respiratory distress.     Breath sounds: Normal breath sounds.  Abdominal:     Palpations: Abdomen is soft.     Tenderness: There is no abdominal tenderness.  Genitourinary:    Rectum: External hemorrhoid present.  Comments: Hemorrhoid present, nonthrombosed, no melena or hematochezia Musculoskeletal:        General: No swelling.     Cervical back: Neck supple.  Skin:    General: Skin is warm and dry.     Capillary Refill: Capillary refill takes less than 2 seconds.  Neurological:     Mental Status: She is alert.     Comments: MENTAL STATUS EXAM:    Orientation: Alert and oriented to person, DISORIENTED TO PLACE AND TIME Memory: Cooperative, follows commands well.  Language: Speech is clear and language is normal.   CRANIAL NERVES:    CN 2 (Optic): Visual fields intact to confrontation.  CN 3,4,6 (EOM): Pupils equal and  reactive to light. Full extraocular eye movement without nystagmus.  CN 5 (Trigeminal): Facial sensation is normal, no weakness of masticatory muscles.  CN 7 (Facial): No facial weakness or asymmetry.  CN 8 (Auditory): Auditory acuity grossly normal.  CN 9,10 (Glossophar): The uvula is midline, the palate elevates symmetrically.  CN 11 (spinal access): Normal sternocleidomastoid and trapezius strength.  CN 12 (Hypoglossal): The tongue is midline. No atrophy or fasciculations.Marland Kitchen   MOTOR:  Muscle Strength: 5/5RUE, 5/5LUE, 5/5RLE, 5/5LLE.   COORDINATION:  Unable to fully cooperate with exam.   SENSATION:   Intact to light touch all four extremities.    Psychiatric:        Mood and Affect: Mood normal.     ED Results / Procedures / Treatments   Labs (all labs ordered are listed, but only abnormal results are displayed) Labs Reviewed  RESP PANEL BY RT-PCR (RSV, FLU A&B, COVID)  RVPGX2 - Abnormal; Notable for the following components:      Result Value   SARS Coronavirus 2 by RT PCR POSITIVE (*)    All other components within normal limits  COMPREHENSIVE METABOLIC PANEL - Abnormal; Notable for the following components:   Glucose, Bld 108 (*)    Total Bilirubin 0.2 (*)    All other components within normal limits  URINALYSIS, ROUTINE W REFLEX MICROSCOPIC - Abnormal; Notable for the following components:   APPearance HAZY (*)    Hgb urine dipstick SMALL (*)    Leukocytes,Ua LARGE (*)    WBC, UA >50 (*)    Bacteria, UA FEW (*)    All other components within normal limits  BRAIN NATRIURETIC PEPTIDE - Abnormal; Notable for the following components:   B Natriuretic Peptide 196.2 (*)    All other components within normal limits  CBG MONITORING, ED - Abnormal; Notable for the following components:   Glucose-Capillary 100 (*)    All other components within normal limits  I-STAT VENOUS BLOOD GAS, ED - Abnormal; Notable for the following components:   pH, Ven 7.498 (*)    pCO2, Ven 30.6  (*)    pO2, Ven 120 (*)    Potassium 3.4 (*)    All other components within normal limits  URINE CULTURE  CULTURE, BLOOD (ROUTINE X 2)  CULTURE, BLOOD (ROUTINE X 2)  CBC WITH DIFFERENTIAL/PLATELET  AMMONIA  RAPID URINE DRUG SCREEN, HOSP PERFORMED  CBC  BASIC METABOLIC PANEL  POC OCCULT BLOOD, ED  TROPONIN I (HIGH SENSITIVITY)    EKG EKG Interpretation  Date/Time:  Saturday May 29 2022 15:50:37 EST Ventricular Rate:  87 PR Interval:    QRS Duration: 92 QT Interval:  380 QTC Calculation: 458 R Axis:   73 Text Interpretation: Atrial fibrillation Ventricular premature complex Confirmed by Regan Lemming ([id number]) on 05/29/2022 8:58:19 PM  Radiology CT HEAD WO CONTRAST  Result Date: 05/29/2022 CLINICAL DATA:  Mental status changes. Confusion. EXAM: CT HEAD WITHOUT CONTRAST TECHNIQUE: Contiguous axial images were obtained from the base of the skull through the vertex without intravenous contrast. RADIATION DOSE REDUCTION: This exam was performed according to the departmental dose-optimization program which includes automated exposure control, adjustment of the mA and/or kV according to patient size and/or use of iterative reconstruction technique. COMPARISON:  03/23/2021 FINDINGS: Brain: There is no evidence for acute hemorrhage, hydrocephalus, mass lesion, or abnormal extra-axial fluid collection. No definite CT evidence for acute infarction. Similar appearance of right parietal and left PCA territory infarcts. Diffuse loss of parenchymal volume is consistent with atrophy. Patchy low attenuation in the deep hemispheric and periventricular white matter is nonspecific, but likely reflects chronic microvascular ischemic demyelination. Vascular: No hyperdense vessel or unexpected calcification. Skull: No evidence for fracture. No worrisome lytic or sclerotic lesion. Sinuses/Orbits: Chronic polypoid mucosal thickening in the left maxillary sinus is unchanged. Remaining visualized paranasal  sinuses and mastoid air cells are clear. Visualized portions of the globes and intraorbital fat are unremarkable. Other: None. IMPRESSION: 1. No acute intracranial abnormality. 2. Old right parietal and left PCA territory infarcts. 3. Atrophy with chronic small vessel ischemic disease. Electronically Signed   By: Misty Stanley M.D.   On: 05/29/2022 18:13   DG Chest Port 1 View  Result Date: 05/29/2022 CLINICAL DATA:  Altered mental status. EXAM: PORTABLE CHEST 1 VIEW COMPARISON:  June 21, 2020 FINDINGS: The cardiac silhouette is markedly enlarged and unchanged in size. Mild, diffuse, chronic appearing increased lung markings are noted. There is no evidence of an acute infiltrate, pleural effusion or pneumothorax. The visualized skeletal structures are unremarkable. IMPRESSION: Marked severity cardiomegaly without evidence of acute or active cardiopulmonary disease. Electronically Signed   By: Virgina Norfolk M.D.   On: 05/29/2022 16:21    Procedures Procedures    Medications Ordered in ED Medications  LORazepam (ATIVAN) tablet 0.5 mg (has no administration in time range)  LORazepam (ATIVAN) tablet 1 mg (has no administration in time range)  sodium chloride flush (NS) 0.9 % injection 3 mL (3 mLs Intravenous Not Given 05/29/22 2151)  acetaminophen (TYLENOL) tablet 650 mg (650 mg Oral Given 05/29/22 2244)    Or  acetaminophen (TYLENOL) suppository 650 mg ( Rectal See Alternative 05/29/22 2244)  lactated ringers infusion (1,000 mLs Intravenous New Bag/Given 05/29/22 2145)  ondansetron (ZOFRAN) tablet 4 mg (has no administration in time range)    Or  ondansetron (ZOFRAN) injection 4 mg (has no administration in time range)  senna-docusate (Senokot-S) tablet 1 tablet (has no administration in time range)  cefTRIAXone (ROCEPHIN) 1 g in sodium chloride 0.9 % 100 mL IVPB (has no administration in time range)  hydrALAZINE (APRESOLINE) injection 10 mg (10 mg Intravenous Given 05/29/22 2241)  apixaban  (ELIQUIS) tablet 5 mg (has no administration in time range)  escitalopram (LEXAPRO) tablet 5 mg (has no administration in time range)  gabapentin (NEURONTIN) capsule 600 mg (has no administration in time range)  metoprolol tartrate (LOPRESSOR) tablet 25 mg (has no administration in time range)  pravastatin (PRAVACHOL) tablet 20 mg (has no administration in time range)  cefTRIAXone (ROCEPHIN) 1 g in sodium chloride 0.9 % 100 mL IVPB (0 g Intravenous Stopped 05/29/22 2102)    ED Course/ Medical Decision Making/ A&P  Medical Decision Making Amount and/or Complexity of Data Reviewed Labs: ordered. Radiology: ordered.  Risk Prescription drug management. Decision regarding hospitalization.     89 year old female with recent diagnosis of COVID-19 who presents to the emergency department from [facility] assisted living for confusion in the setting of UTI.  Patient reportedly had bright red rectal bleeding.  She been complaining of persistent burning while urinating.  No report of neurologic deficit.  On arrival, the patient was alert and oriented x 1 only, no family members immediate available for further history.  Patient unable to provide any further history of present illness.  On arrival, the patient was afebrile, hemodynamically hypertensive BP 200/76, heart rate 86, RR 14, saturating 100% on room air.  Physical exam significant for a neurologic exam without focal deficit, acute encephalopathy with a GCS of 14, AAO x 1, no focal neurologic deficits noted.  Differential diagnosis includes encephalopathy from infectious source, considered UTI given the patient's recent history, pneumonia, considered electrolyte abnormality, considered CVA although no clear focal deficit noted, considered hypertensive emergency and PRES. afebrile, good range of motion of the neck, low concern for meningitis.  The patient is status post a 7-day course of Keflex for UTI.  She completed a  course of Paxlovid for her COVID.  The patient on exam was found to have no bright red blood per rectum, no melena.  She does not have hemorrhoids that were nonbleeding and nonthrombosed.  Laboratory evaluation significant for CBG 100, BNP 196, ammonia normal, CBC without a leukocytosis or anemia, UDS negative, urinalysis with large leukocytes, greater than 50 WBCs, few bacteria present, CMP generally unremarkable, VBG with mild respiratory alkalosis, COVID-19 PCR testing remains positive for COVID.  Troponin negative.  Chest x-ray was without evidence of bacterial pneumonia.  A CT head was performed which revealed the following: IMPRESSION:  1. No acute intracranial abnormality.  2. Old right parietal and left PCA territory infarcts.  3. Atrophy with chronic small vessel ischemic disease.    MRI brain ordered to evaluate for CVA although acute ischemic stroke felt to be less likely and patient last known normal is unknown.  MRI to also evaluate for possible PR ES.  Will allow for permissive hypertension until CVA ruled out with MRI.  Will administer Rocephin IV given the patient's urinalysis findings and change in mental status.  Lower concern for PR ES, do not feel that the patient warrants ICU admission at this time.  On repeat assessment, patient blood pressure improved to 171/92 without intervention.  MRI brain pending.  Hospitalist medicine subsequently consulted for admission, Dr. Posey Pronto accepting.   Final Clinical Impression(s) / ED Diagnoses Final diagnoses:  Altered mental status, unspecified altered mental status type  Lower urinary tract infectious disease  Secondary hypertension    Rx / DC Orders ED Discharge Orders     None         Regan Lemming, MD 05/29/22 2316

## 2022-05-29 NOTE — Assessment & Plan Note (Signed)
Continue pravastatin 

## 2022-05-29 NOTE — ED Notes (Signed)
POC occult test negative (notified MD)

## 2022-05-29 NOTE — Assessment & Plan Note (Signed)
Remains in atrial fibrillation with controlled rate.  Will resume Lopressor for rate control.  Continue Eliquis.

## 2022-05-30 ENCOUNTER — Encounter (HOSPITAL_COMMUNITY): Payer: Self-pay | Admitting: Internal Medicine

## 2022-05-30 ENCOUNTER — Observation Stay (HOSPITAL_COMMUNITY): Payer: Medicare Other

## 2022-05-30 ENCOUNTER — Other Ambulatory Visit: Payer: Self-pay

## 2022-05-30 DIAGNOSIS — U071 COVID-19: Secondary | ICD-10-CM | POA: Diagnosis present

## 2022-05-30 DIAGNOSIS — Z8673 Personal history of transient ischemic attack (TIA), and cerebral infarction without residual deficits: Secondary | ICD-10-CM | POA: Diagnosis not present

## 2022-05-30 DIAGNOSIS — B961 Klebsiella pneumoniae [K. pneumoniae] as the cause of diseases classified elsewhere: Secondary | ICD-10-CM | POA: Diagnosis present

## 2022-05-30 DIAGNOSIS — Z86010 Personal history of colonic polyps: Secondary | ICD-10-CM | POA: Diagnosis not present

## 2022-05-30 DIAGNOSIS — E785 Hyperlipidemia, unspecified: Secondary | ICD-10-CM | POA: Diagnosis present

## 2022-05-30 DIAGNOSIS — Z86711 Personal history of pulmonary embolism: Secondary | ICD-10-CM | POA: Diagnosis not present

## 2022-05-30 DIAGNOSIS — Z8249 Family history of ischemic heart disease and other diseases of the circulatory system: Secondary | ICD-10-CM | POA: Diagnosis not present

## 2022-05-30 DIAGNOSIS — Z96652 Presence of left artificial knee joint: Secondary | ICD-10-CM | POA: Diagnosis present

## 2022-05-30 DIAGNOSIS — F411 Generalized anxiety disorder: Secondary | ICD-10-CM | POA: Diagnosis present

## 2022-05-30 DIAGNOSIS — I4821 Permanent atrial fibrillation: Secondary | ICD-10-CM | POA: Diagnosis present

## 2022-05-30 DIAGNOSIS — Z1629 Resistance to other single specified antibiotic: Secondary | ICD-10-CM | POA: Diagnosis present

## 2022-05-30 DIAGNOSIS — Z885 Allergy status to narcotic agent status: Secondary | ICD-10-CM | POA: Diagnosis not present

## 2022-05-30 DIAGNOSIS — Z90710 Acquired absence of both cervix and uterus: Secondary | ICD-10-CM | POA: Diagnosis not present

## 2022-05-30 DIAGNOSIS — G4730 Sleep apnea, unspecified: Secondary | ICD-10-CM | POA: Diagnosis present

## 2022-05-30 DIAGNOSIS — Z85828 Personal history of other malignant neoplasm of skin: Secondary | ICD-10-CM | POA: Diagnosis not present

## 2022-05-30 DIAGNOSIS — I159 Secondary hypertension, unspecified: Secondary | ICD-10-CM | POA: Diagnosis present

## 2022-05-30 DIAGNOSIS — N390 Urinary tract infection, site not specified: Secondary | ICD-10-CM | POA: Diagnosis present

## 2022-05-30 DIAGNOSIS — B964 Proteus (mirabilis) (morganii) as the cause of diseases classified elsewhere: Secondary | ICD-10-CM | POA: Diagnosis present

## 2022-05-30 DIAGNOSIS — K921 Melena: Secondary | ICD-10-CM | POA: Diagnosis present

## 2022-05-30 DIAGNOSIS — Z880 Allergy status to penicillin: Secondary | ICD-10-CM | POA: Diagnosis not present

## 2022-05-30 DIAGNOSIS — G9341 Metabolic encephalopathy: Secondary | ICD-10-CM | POA: Diagnosis not present

## 2022-05-30 DIAGNOSIS — R4182 Altered mental status, unspecified: Secondary | ICD-10-CM | POA: Diagnosis not present

## 2022-05-30 DIAGNOSIS — E538 Deficiency of other specified B group vitamins: Secondary | ICD-10-CM | POA: Diagnosis present

## 2022-05-30 DIAGNOSIS — H9110 Presbycusis, unspecified ear: Secondary | ICD-10-CM | POA: Diagnosis present

## 2022-05-30 DIAGNOSIS — Z8672 Personal history of thrombophlebitis: Secondary | ICD-10-CM | POA: Diagnosis not present

## 2022-05-30 DIAGNOSIS — G629 Polyneuropathy, unspecified: Secondary | ICD-10-CM | POA: Diagnosis present

## 2022-05-30 LAB — CBC
HCT: 37.1 % (ref 36.0–46.0)
Hemoglobin: 12.6 g/dL (ref 12.0–15.0)
MCH: 29.2 pg (ref 26.0–34.0)
MCHC: 34 g/dL (ref 30.0–36.0)
MCV: 86.1 fL (ref 80.0–100.0)
Platelets: 234 10*3/uL (ref 150–400)
RBC: 4.31 MIL/uL (ref 3.87–5.11)
RDW: 14 % (ref 11.5–15.5)
WBC: 12 10*3/uL — ABNORMAL HIGH (ref 4.0–10.5)
nRBC: 0 % (ref 0.0–0.2)

## 2022-05-30 LAB — BASIC METABOLIC PANEL
Anion gap: 11 (ref 5–15)
BUN: 7 mg/dL — ABNORMAL LOW (ref 8–23)
CO2: 21 mmol/L — ABNORMAL LOW (ref 22–32)
Calcium: 9.2 mg/dL (ref 8.9–10.3)
Chloride: 101 mmol/L (ref 98–111)
Creatinine, Ser: 0.62 mg/dL (ref 0.44–1.00)
GFR, Estimated: >60 mL/min (ref >60)
Glucose, Bld: 131 mg/dL — ABNORMAL HIGH (ref 70–99)
Potassium: 3.3 mmol/L — ABNORMAL LOW (ref 3.5–5.1)
Sodium: 133 mmol/L — ABNORMAL LOW (ref 135–145)

## 2022-05-30 LAB — CBG MONITORING, ED: Glucose-Capillary: 150 mg/dL — ABNORMAL HIGH (ref 70–99)

## 2022-05-30 LAB — MAGNESIUM: Magnesium: 1.8 mg/dL (ref 1.7–2.4)

## 2022-05-30 LAB — VITAMIN B12: Vitamin B-12: 187 pg/mL (ref 180–914)

## 2022-05-30 LAB — TSH: TSH: 1.594 u[IU]/mL (ref 0.350–4.500)

## 2022-05-30 MED ORDER — POTASSIUM CHLORIDE IN NACL 20-0.9 MEQ/L-% IV SOLN
INTRAVENOUS | Status: DC
  Filled 2022-05-30 (×3): qty 1000

## 2022-05-30 MED ORDER — HYDROCHLOROTHIAZIDE 12.5 MG PO TABS
12.5000 mg | ORAL_TABLET | Freq: Every day | ORAL | Status: DC
  Administered 2022-05-31: 12.5 mg via ORAL
  Filled 2022-05-30 (×4): qty 1

## 2022-05-30 MED ORDER — GABAPENTIN 100 MG PO CAPS
100.0000 mg | ORAL_CAPSULE | Freq: Three times a day (TID) | ORAL | Status: DC
  Administered 2022-05-30 – 2022-05-31 (×4): 100 mg via ORAL
  Filled 2022-05-30 (×4): qty 1

## 2022-05-30 MED ORDER — HYDRALAZINE HCL 20 MG/ML IJ SOLN: 10.0000 mg | INTRAMUSCULAR | Status: DC | PRN

## 2022-05-30 NOTE — Progress Notes (Addendum)
PROGRESS NOTE    Samantha Bishop  KYH:[id number] DOB: Apr 06, 1925 DOA: 05/29/2022 PCP: Hoyt Koch, MD      Brief Narrative:   From admission h and p  Samantha Bishop is a 89 y.o. female with medical history significant for permanent atrial fibrillation on Eliquis, history of CVA, HTN, HLD, anxiety who presented to the ED from [facility] for altered mental status.   Patient was seen by her PCP on 05/19/2022 with dysuria and URI symptoms.  She was found to have a UTI and tested positive for COVID.  She was started on 7-day course of Keflex and completed course of Paxlovid.  Urine culture grew Proteus resistant to nitrofurantoin but otherwise sensitive.  Patient held Eliquis until Paxlovid course was completed and has since restarted.   Patient states that she has been isolating due to the [place] outbreak at her facility.  She has just been feeling increasingly lethargic and generally weak.  She reports urinary frequency.  She had reported dysuria earlier.  She says she saw some small spots of blood on toilet paper after bowel movement but no large bloody bowel movements.  She does report a history of hemorrhoids.    Assessment & Plan:   Principal Problem:   Acute metabolic encephalopathy Active Problems:   Hyperlipidemia   Essential hypertension   Permanent atrial fibrillation (HCC)   Adjustment disorder with mixed anxiety and depressed mood   History of CVA (cerebrovascular accident)   COVID  # Acute metabolic encephalopathy Vs delirium. Likely 2/2 covid infection. Possible UTI. No hepatic or kidney dysfunction, no significant electrolyte abnormality, not hypoglycemic, no report of seizure-like activity, MRI negative for acute process - treatments as below - delirium precautions - f/u blood cultures - gentle hydration - f/u b12, tsh, am cortisol - PT/OT consult  - limited PO today, need to monitor  # Covid-19 Tested positive on 1/10, unclear  when symptoms started. Treated with course molnupiravir. No hypoxia or dyspnea to warrant steroids, CXR clear. - monitor symptoms  # UTI? Recently treated for proteus uti with keflex. Urinalysis equivocal for infection. Patient unable to give me a history but per admitter's note there is report of frequency, possible dysuria. - will continue empiric ceftriaxone for now and f/u culture results  # Neuropathy - decrease home gabapentin dose from 600 tid to 100 tid while encephalopathic  # HTN Here BPs severely elevated to 200s, now improving to 160s - cont home metoprolol, statin - start home hctz - hydral prn  # A-fib Rate controlled - cont home metop and apixaban  # Hx CVA Old infarcts seen on MRI, nothing acute - cont home apixaban, statin  # GAD - cont home excitalopram 5  # Hematochezia Hx hemorrhoids, per admitting provider report of few drops of red blood. Hgb wnl - monitor for now   DVT prophylaxis: apixaban Code Status: full, will confirm with family if able to reach them today Family Communication: son updated telephonically 1/21  Level of care: Telemetry Medical Status is: Observation    Consultants:  none  Procedures: none  Antimicrobials:  ceftriaxone    Subjective: confused  Objective: Vitals:   05/30/22 0715 05/30/22 0735 05/30/22 0736 05/30/22 0748  BP:  (!) 168/83    Pulse:  98    Resp:  19    Temp: 98.4 F (36.9 C)   (!) 100.4 F (38 C)  TempSrc: Oral   Rectal  SpO2:  96%    Weight:  78.9 kg   Height:   '5\' 7"'$  (1.702 m)     Intake/Output Summary (Last 24 hours) at 05/30/2022 0846 Last data filed at 05/29/2022 2151 Gross per 24 hour  Intake 3.24 ml  Output --  Net 3.24 ml   Filed Weights   05/30/22 0736  Weight: 78.9 kg    Examination:  General exam: confused, NAD Respiratory system: Clear to auscultation. Respiratory effort normal. Cardiovascular system: S1 & S2 heard, irreg irreg, soft systolic murmur Gastrointestinal  system: Abdomen is obese, soft and nontender. No organomegaly or masses felt. Normal bowel sounds heard. Central nervous system: awake, moving all 4 Extremities: Symmetric 5 x 5 power. Skin: No rashes, lesions or ulcers Psychiatry: confused    Data Reviewed: I have personally reviewed following labs and imaging studies  CBC: Recent Labs  Lab 05/29/22 1625 05/29/22 1736 05/30/22 0608  WBC 8.6  --  12.0*  NEUTROABS 5.8  --   --   HGB 12.1 12.2 12.6  HCT 36.8 36.0 37.1  MCV 88.0  --  86.1  PLT 252  --  408   Basic Metabolic Panel: Recent Labs  Lab 05/29/22 1625 05/29/22 1736 05/30/22 0608  NA 138 141 133*  K 3.6 3.4* 3.3*  CL 103  --  101  CO2 23  --  21*  GLUCOSE 108*  --  131*  BUN 8  --  7*  CREATININE 0.58  --  0.62  CALCIUM 9.8  --  9.2   GFR: Estimated Creatinine Clearance: 45.5 mL/min (by C-G formula based on SCr of 0.62 mg/dL). Liver Function Tests: Recent Labs  Lab 05/29/22 1625  AST 21  ALT 12  ALKPHOS 48  BILITOT 0.2*  PROT 7.8  ALBUMIN 4.1   No results for input(s): "LIPASE", "AMYLASE" in the last 168 hours. Recent Labs  Lab 05/29/22 1625  AMMONIA 19   Coagulation Profile: No results for input(s): "INR", "PROTIME" in the last 168 hours. Cardiac Enzymes: No results for input(s): "CKTOTAL", "CKMB", "CKMBINDEX", "TROPONINI" in the last 168 hours. BNP (last 3 results) No results for input(s): "PROBNP" in the last 8760 hours. HbA1C: No results for input(s): "HGBA1C" in the last 72 hours. CBG: Recent Labs  Lab 05/29/22 1721 05/30/22 0752  GLUCAP 100* 150*   Lipid Profile: No results for input(s): "CHOL", "HDL", "LDLCALC", "TRIG", "CHOLHDL", "LDLDIRECT" in the last 72 hours. Thyroid Function Tests: No results for input(s): "TSH", "T4TOTAL", "FREET4", "T3FREE", "THYROIDAB" in the last 72 hours. Anemia Panel: No results for input(s): "VITAMINB12", "FOLATE", "FERRITIN", "TIBC", "IRON", "RETICCTPCT" in the last 72 hours. Urine analysis:     Component Value Date/Time   COLORURINE YELLOW 05/29/2022 1543   APPEARANCEUR HAZY (A) 05/29/2022 1543   LABSPEC 1.006 05/29/2022 1543   PHURINE 7.0 05/29/2022 1543   GLUCOSEU NEGATIVE 05/29/2022 1543   GLUCOSEU NEGATIVE 07/10/2021 1617   HGBUR SMALL (A) 05/29/2022 1543   BILIRUBINUR NEGATIVE 05/29/2022 1543   BILIRUBINUR negative 05/19/2022 1507   KETONESUR NEGATIVE 05/29/2022 1543   PROTEINUR NEGATIVE 05/29/2022 1543   UROBILINOGEN 0.2 05/19/2022 1507   UROBILINOGEN 0.2 07/10/2021 1617   NITRITE NEGATIVE 05/29/2022 1543   LEUKOCYTESUR LARGE (A) 05/29/2022 1543   Sepsis Labs: '@LABRCNTIP'$ (procalcitonin:4,lacticidven:4)  ) Recent Results (from the past 240 hour(s))  Resp panel by RT-PCR (RSV, Flu A&B, Covid) Anterior Nasal Swab     Status: Abnormal   Collection Time: 05/29/22  9:35 PM   Specimen: Anterior Nasal Swab  Result Value Ref Range Status   SARS  Coronavirus 2 by RT PCR POSITIVE (A) NEGATIVE Final    Comment: (NOTE) SARS-CoV-2 target nucleic acids are DETECTED.  The SARS-CoV-2 RNA is generally detectable in upper respiratory specimens during the acute phase of infection. Positive results are indicative of the presence of the identified virus, but do not rule out bacterial infection or co-infection with other pathogens not detected by the test. Clinical correlation with patient history and other diagnostic information is necessary to determine patient infection status. The expected result is Negative.  Fact Sheet for Patients: EntrepreneurPulse.com.au  Fact Sheet for Healthcare Providers: IncredibleEmployment.be  This test is not yet approved or cleared by the [place] FDA and  has been authorized for detection and/or diagnosis of SARS-CoV-2 by FDA under an Emergency Use Authorization (EUA).  This EUA will remain in effect (meaning this test can be used) for the duration of  the COVID-19 declaration under Section 564(b)(1)  of the A ct, 21 U.S.C. section 360bbb-3(b)(1), unless the authorization is terminated or revoked sooner.     Influenza A by PCR NEGATIVE NEGATIVE Final   Influenza B by PCR NEGATIVE NEGATIVE Final    Comment: (NOTE) The Xpert Xpress SARS-CoV-2/FLU/RSV plus assay is intended as an aid in the diagnosis of influenza from Nasopharyngeal swab specimens and should not be used as a sole basis for treatment. Nasal washings and aspirates are unacceptable for Xpert Xpress SARS-CoV-2/FLU/RSV testing.  Fact Sheet for Patients: EntrepreneurPulse.com.au  Fact Sheet for Healthcare Providers: IncredibleEmployment.be  This test is not yet approved or cleared by the [place] FDA and has been authorized for detection and/or diagnosis of SARS-CoV-2 by FDA under an Emergency Use Authorization (EUA). This EUA will remain in effect (meaning this test can be used) for the duration of the COVID-19 declaration under Section 564(b)(1) of the Act, 21 U.S.C. section 360bbb-3(b)(1), unless the authorization is terminated or revoked.     Resp Syncytial Virus by PCR NEGATIVE NEGATIVE Final    Comment: (NOTE) Fact Sheet for Patients: EntrepreneurPulse.com.au  Fact Sheet for Healthcare Providers: IncredibleEmployment.be  This test is not yet approved or cleared by the [place] FDA and has been authorized for detection and/or diagnosis of SARS-CoV-2 by FDA under an Emergency Use Authorization (EUA). This EUA will remain in effect (meaning this test can be used) for the duration of the COVID-19 declaration under Section 564(b)(1) of the Act, 21 U.S.C. section 360bbb-3(b)(1), unless the authorization is terminated or revoked.  Performed at [facility], [place] [address] [place], [place] [address]          Radiology Studies: MR BRAIN WO CONTRAST  Result Date: 05/30/2022 CLINICAL DATA:  Altered mental status EXAM:  MRI HEAD WITHOUT CONTRAST TECHNIQUE: Multiplanar, multiecho pulse sequences of the brain and surrounding structures were obtained without intravenous contrast. COMPARISON:  None Available. FINDINGS: Brain: No acute infarction, hemorrhage, hydrocephalus, extra-axial collection or mass lesion. Multifocal chronic microhemorrhage (2-3 foci) in a mixed central and peripheral distribution. There is confluent hyperintense T2-weighted signal within the periventricular and deep white matter. 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 Generalized volume loss. Normal midline structures. Vascular: Normal flow voids. Skull and upper cervical spine: Normal marrow signal. Sinuses/Orbits: Left maxillary sinus retention cyst. Normal orbits. Other: None. IMPRESSION: 1. No acute intracranial abnormality. 2. 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 3. Advanced chronic microvascular ischemic changes and volume loss. Electronically Signed   By: Cletus Gash.D.  On: 05/30/2022 03:20   CT HEAD WO CONTRAST  Result Date: 05/29/2022 CLINICAL DATA:  Mental status changes. Confusion. EXAM: CT HEAD WITHOUT CONTRAST TECHNIQUE: Contiguous axial images were obtained from the base of the skull through the vertex without intravenous contrast. RADIATION DOSE REDUCTION: This exam was performed according to the departmental dose-optimization program which includes automated exposure control, adjustment of the mA and/or kV according to patient size and/or use of iterative reconstruction technique. COMPARISON:  03/23/2021 FINDINGS: Brain: There is no evidence for acute hemorrhage, hydrocephalus, mass lesion, or abnormal extra-axial fluid collection. No definite CT evidence for acute infarction. Similar appearance of right parietal and left PCA territory infarcts. Diffuse loss of parenchymal volume is consistent with atrophy. Patchy low attenuation in the deep  hemispheric and periventricular white matter is nonspecific, but likely reflects chronic microvascular ischemic demyelination. Vascular: No hyperdense vessel or unexpected calcification. Skull: No evidence for fracture. No worrisome lytic or sclerotic lesion. Sinuses/Orbits: Chronic polypoid mucosal thickening in the left maxillary sinus is unchanged. Remaining visualized paranasal sinuses and mastoid air cells are clear. Visualized portions of the globes and intraorbital fat are unremarkable. Other: None. IMPRESSION: 1. No acute intracranial abnormality. 2. Old right parietal and left PCA territory infarcts. 3. Atrophy with chronic small vessel ischemic disease. Electronically Signed   By: Misty Stanley M.D.   On: 05/29/2022 18:13   DG Chest Port 1 View  Result Date: 05/29/2022 CLINICAL DATA:  Altered mental status. EXAM: PORTABLE CHEST 1 VIEW COMPARISON:  June 21, 2020 FINDINGS: The cardiac silhouette is markedly enlarged and unchanged in size. Mild, diffuse, chronic appearing increased lung markings are noted. There is no evidence of an acute infiltrate, pleural effusion or pneumothorax. The visualized skeletal structures are unremarkable. IMPRESSION: Marked severity cardiomegaly without evidence of acute or active cardiopulmonary disease. Electronically Signed   By: Virgina Norfolk M.D.   On: 05/29/2022 16:21        Scheduled Meds:  apixaban  5 mg Oral BID   escitalopram  5 mg Oral Daily   gabapentin  100 mg Oral TID   hydrochlorothiazide  12.5 mg Oral Daily   metoprolol tartrate  25 mg Oral BID   pravastatin  20 mg Oral QPM   sodium chloride flush  3 mL Intravenous Q12H   Continuous Infusions:  cefTRIAXone (ROCEPHIN)  IV     lactated ringers 125 mL/hr at 05/30/22 0751     LOS: 0 days     Desma Maxim, MD [facility]   If 7PM-7AM, please contact night-coverage [email or web address] Password [facility] 05/30/2022, 8:46 AM

## 2022-05-30 NOTE — ED Notes (Signed)
Requested microzide from pharmacy

## 2022-05-30 NOTE — ED Notes (Signed)
ED TO INPATIENT HANDOFF REPORT  ED Nurse Name and Phone #: Hassan Rowan [phone]  S Name/Age/Gender Samantha Bishop 89 y.o. female Room/Bed: 006C/006C  Code Status   Code Status: Full Code  Home/SNF/Other Skilled nursing facility Patient oriented to: self Is this baseline? No   Triage Complete: Triage complete  Chief Complaint Acute metabolic encephalopathy [D74.12]  Triage Note Patient bib [facility]. Per ems facility called out saying patient is confused but A&Ox3 with bright red rectal bleeding. Patient complains of burning with urination and is on antibiotics. Patient had covid 2 weeks ago stopped eliquis recently started back. VSS   Allergies Allergies  Allergen Reactions   Codeine Nausea Only   Penicillins Rash    Level of Care/Admitting Diagnosis ED Disposition     ED Disposition  Admit   Condition  --   [place]: [place] [[id number]]  Level of Care: Telemetry Medical [104]  May place patient in observation at [facility] or [place] if equivalent level of care is available:: No  Covid Evaluation: Asymptomatic - no recent exposure (last 10 days) testing not required  Diagnosis: Acute metabolic encephalopathy [[id number]]  Admitting Physician: Lenore Cordia [[id number]]  Attending Physician: Lenore Cordia [[id number]]          B Medical/Surgery History Past Medical History:  Diagnosis Date   Anxiety state, unspecified    Calculus of kidney    Cerebral aneurysm, nonruptured    Colon polyp    Complication of anesthesia    "my heart stopped" 2004 knee  surg - see note on chart   DEEP VENOUS THROMBOPHLEBITIS 08/15/2007   Qualifier: History of  By: Lenna Gilford MD, Deborra Medina    Diverticulosis of colon (without mention of hemorrhage)    History of skin cancer    HTN (hypertension)    Hyperlipidemia    Lumbago    Osteoarthrosis, unspecified whether generalized or localized, unspecified site    Permanent atrial  fibrillation ([place])    Pulmonary emboli ([place])    following remote knee arthroscopy surgery   Ruptured lumbar disc    Sleep apnea    stop bang score 4    Unspecified hemorrhoids without mention of complication    Past Surgical History:  Procedure Laterality Date   ABDOMINAL HYSTERECTOMY     APPENDECTOMY     BLOCKED INTESTINE SURGERY     BREAST CYST EXCISION     ESOPHAGOGASTRODUODENOSCOPY (EGD) WITH PROPOFOL N/A 05/30/2019   Procedure: ESOPHAGOGASTRODUODENOSCOPY (EGD) WITH PROPOFOL;  Surgeon: Thornton Park, MD;  Location: [place];  Service: Gastroenterology;  Laterality: N/A;   HEMHORROIDECTOMY     JOINT REPLACEMENT  2004   RT TOTAL KNEE   KNEE ARTHROSCOPY     X2 L KNEE   KNEE SURGERY     NASAL SURGERY X2     TONSILLECTOMY AND ADENOIDECTOMY     TOTAL KNEE ARTHROPLASTY  09/03/2011   Procedure: TOTAL KNEE ARTHROPLASTY;  Surgeon: Gearlean Alf, MD;  Location: [facility];  Service: Orthopedics;  Laterality: Left;     A IV Location/Drains/Wounds Patient Lines/Drains/Airways Status     Active Line/Drains/Airways     Name Placement date Placement time Site Days   Peripheral IV 05/29/22 20 G Left;Posterior;Proximal Forearm 05/29/22  1551  Forearm  1   Peripheral IV 05/29/22 20 G Posterior;Right Forearm 05/29/22  1811  Forearm  1   External Urinary Catheter 05/30/22  0758  --  less than 1  Intake/Output Last 24 hours  Intake/Output Summary (Last 24 hours) at 05/30/2022 1427 Last data filed at 05/30/2022 1030 Gross per 24 hour  Intake 303.24 ml  Output --  Net 303.24 ml    Labs/Imaging Results for orders placed or performed during the hospital encounter of 05/29/22 (from the past 48 hour(s))  Urinalysis, Routine w reflex microscopic     Status: Abnormal   Collection Time: 05/29/22  3:43 PM  Result Value Ref Range   Color, Urine YELLOW YELLOW   APPearance HAZY (A) CLEAR   Specific Gravity, Urine 1.006 1.005 - 1.030   pH 7.0 5.0 - 8.0   Glucose, UA  NEGATIVE NEGATIVE mg/dL   Hgb urine dipstick SMALL (A) NEGATIVE   Bilirubin Urine NEGATIVE NEGATIVE   Ketones, ur NEGATIVE NEGATIVE mg/dL   Protein, ur NEGATIVE NEGATIVE mg/dL   Nitrite NEGATIVE NEGATIVE   Leukocytes,Ua LARGE (A) NEGATIVE   RBC / HPF 0-5 0 - 5 RBC/hpf   WBC, UA >50 (H) 0 - 5 WBC/hpf   Bacteria, UA FEW (A) NONE SEEN   Squamous Epithelial / HPF 0-5 0 - 5 /HPF    Comment: Performed at [facility], [address], [place], [place] [address]  Rapid urine drug screen (hospital performed)     Status: None   Collection Time: 05/29/22  3:44 PM  Result Value Ref Range   Opiates NONE DETECTED NONE DETECTED   Cocaine NONE DETECTED NONE DETECTED   Benzodiazepines NONE DETECTED NONE DETECTED   Amphetamines NONE DETECTED NONE DETECTED   Tetrahydrocannabinol NONE DETECTED NONE DETECTED   Barbiturates NONE DETECTED NONE DETECTED    Comment: (NOTE) DRUG SCREEN FOR MEDICAL PURPOSES ONLY.  IF CONFIRMATION IS NEEDED FOR ANY PURPOSE, NOTIFY LAB WITHIN 5 DAYS.  LOWEST DETECTABLE LIMITS FOR URINE DRUG SCREEN Drug Class                     Cutoff (ng/mL) Amphetamine and metabolites    1000 Barbiturate and metabolites    200 Benzodiazepine                 200 Opiates and metabolites        300 Cocaine and metabolites        300 THC                            50 Performed at [facility], [place] [address] [place], [place] [address]   Comprehensive metabolic panel     Status: Abnormal   Collection Time: 05/29/22  4:25 PM  Result Value Ref Range   Sodium 138 135 - 145 mmol/L   Potassium 3.6 3.5 - 5.1 mmol/L   Chloride 103 98 - 111 mmol/L   CO2 23 22 - 32 mmol/L   Glucose, Bld 108 (H) 70 - 99 mg/dL    Comment: Glucose reference range applies only to samples taken after fasting for at least 8 hours.   BUN 8 8 - 23 mg/dL   Creatinine, Ser 0.58 0.44 - 1.00 mg/dL   Calcium 9.8 8.9 - 10.3 mg/dL   Total Protein 7.8 6.5 - 8.1 g/dL   Albumin 4.1 3.5 - 5.0 g/dL   AST 21 15  - 41 U/L   ALT 12 0 - 44 U/L   Alkaline Phosphatase 48 38 - 126 U/L   Total Bilirubin 0.2 (L) 0.3 - 1.2 mg/dL   GFR, Estimated >60 >60  mL/min    Comment: (NOTE) Calculated using the CKD-EPI Creatinine Equation (2021)    Anion gap 12 5 - 15    Comment: Performed at [facility], [place] [address], [place], [place] [address]  CBC with Differential/Platelet     Status: None   Collection Time: 05/29/22  4:25 PM  Result Value Ref Range   WBC 8.6 4.0 - 10.5 K/uL   RBC 4.18 3.87 - 5.11 MIL/uL   Hemoglobin 12.1 12.0 - 15.0 g/dL   HCT 36.8 36.0 - 46.0 %   MCV 88.0 80.0 - 100.0 fL   MCH 28.9 26.0 - 34.0 pg   MCHC 32.9 30.0 - 36.0 g/dL   RDW 14.0 11.5 - 15.5 %   Platelets 252 150 - 400 K/uL   nRBC 0.0 0.0 - 0.2 %   Neutrophils Relative % 67 %   Neutro Abs 5.8 1.7 - 7.7 K/uL   Lymphocytes Relative 23 %   Lymphs Abs 2.0 0.7 - 4.0 K/uL   Monocytes Relative 8 %   Monocytes Absolute 0.7 0.1 - 1.0 K/uL   Eosinophils Relative 1 %   Eosinophils Absolute 0.0 0.0 - 0.5 K/uL   Basophils Relative 1 %   Basophils Absolute 0.1 0.0 - 0.1 K/uL   Immature Granulocytes 0 %   Abs Immature Granulocytes 0.02 0.00 - 0.07 K/uL    Comment: Performed at [facility], [address], [place], [place] [address]  Ammonia     Status: None   Collection Time: 05/29/22  4:25 PM  Result Value Ref Range   Ammonia 19 9 - 35 umol/L    Comment: Performed at [facility], [place] [address] [place], [place] [address]  Blood Culture (Routine X 2)     Status: None (Preliminary result)   Collection Time: 05/29/22  4:25 PM   Specimen: BLOOD LEFT FOREARM  Result Value Ref Range   Specimen Description BLOOD LEFT FOREARM    Special Requests      BOTTLES DRAWN AEROBIC AND ANAEROBIC Blood Culture adequate volume   Culture      NO GROWTH < 24 HOURS Performed at [facility], [place] [address], [place], [place] [address]    Report Status PENDING   Brain natriuretic peptide     Status: Abnormal   Collection  Time: 05/29/22  4:50 PM  Result Value Ref Range   B Natriuretic Peptide 196.2 (H) 0.0 - 100.0 pg/mL    Comment: Performed at [place] [address], [place], [place] [address]  Troponin I (High Sensitivity)     Status: None   Collection Time: 05/29/22  4:50 PM  Result Value Ref Range   Troponin I (High Sensitivity) 12 <18 ng/L    Comment: (NOTE) Elevated high sensitivity troponin I (hsTnI) values and significant  changes across serial measurements may suggest ACS but many other  chronic and acute conditions are known to elevate hsTnI results.  Refer to the "Links" section for chest pain algorithms and additional  guidance. Performed at [facility], [place] [address] [place] [address], [place], [place] [address]   CBG monitoring, ED     Status: Abnormal   Collection Time: 05/29/22  5:21 PM  Result Value Ref Range   Glucose-Capillary 100 (H) 70 - 99 mg/dL    Comment: Glucose reference range applies only to samples taken after fasting for at least 8 hours.  Blood Culture (Routine X 2)     Status:  None (Preliminary result)   Collection Time: 05/29/22  5:27 PM   Specimen: BLOOD  Result Value Ref Range   Specimen Description BLOOD SITE NOT SPECIFIED    Special Requests      BOTTLES DRAWN AEROBIC AND ANAEROBIC Blood Culture adequate volume   Culture      NO GROWTH < 24 HOURS Performed at [facility], [place] [address] [place], [place] [address]    Report Status PENDING   I-Stat venous blood gas, ED     Status: Abnormal   Collection Time: 05/29/22  5:36 PM  Result Value Ref Range   pH, Ven 7.498 (H) 7.25 - 7.43   pCO2, Ven 30.6 (L) 44 - 60 mmHg   pO2, Ven 120 (H) 32 - 45 mmHg   Bicarbonate 23.7 20.0 - 28.0 mmol/L   TCO2 25 22 - 32 mmol/L   O2 Saturation 99 %   Acid-Base Excess 1.0 0.0 - 2.0 mmol/L   Sodium 141 135 - 145 mmol/L   Potassium 3.4 (L) 3.5 - 5.1 mmol/L   Calcium, Ion 1.18 1.15 - 1.40 mmol/L   HCT 36.0 36.0 - 46.0 %   Hemoglobin 12.2 12.0 - 15.0 g/dL   Sample  type VENOUS   Resp panel by RT-PCR (RSV, Flu A&B, Covid) Anterior Nasal Swab     Status: Abnormal   Collection Time: 05/29/22  9:35 PM   Specimen: Anterior Nasal Swab  Result Value Ref Range   SARS Coronavirus 2 by RT PCR POSITIVE (A) NEGATIVE    Comment: (NOTE) SARS-CoV-2 target nucleic acids are DETECTED.  The SARS-CoV-2 RNA is generally detectable in upper respiratory specimens during the acute phase of infection. Positive results are indicative of the presence of the identified virus, but do not rule out bacterial infection or co-infection with other pathogens not detected by the test. Clinical correlation with patient history and other diagnostic information is necessary to determine patient infection status. The expected result is Negative.  Fact Sheet for Patients: EntrepreneurPulse.com.au  Fact Sheet for Healthcare Providers: IncredibleEmployment.be  This test is not yet approved or cleared by the [place] FDA and  has been authorized for detection and/or diagnosis of SARS-CoV-2 by FDA under an Emergency Use Authorization (EUA).  This EUA will remain in effect (meaning this test can be used) for the duration of  the COVID-19 declaration under Section 564(b)(1) of the A ct, 21 U.S.C. section 360bbb-3(b)(1), unless the authorization is terminated or revoked sooner.     Influenza A by PCR NEGATIVE NEGATIVE   Influenza B by PCR NEGATIVE NEGATIVE    Comment: (NOTE) The Xpert Xpress SARS-CoV-2/FLU/RSV plus assay is intended as an aid in the diagnosis of influenza from Nasopharyngeal swab specimens and should not be used as a sole basis for treatment. Nasal washings and aspirates are unacceptable for Xpert Xpress SARS-CoV-2/FLU/RSV testing.  Fact Sheet for Patients: EntrepreneurPulse.com.au  Fact Sheet for Healthcare Providers: IncredibleEmployment.be  This test is not yet approved or cleared by  the [place] FDA and has been authorized for detection and/or diagnosis of SARS-CoV-2 by FDA under an Emergency Use Authorization (EUA). This EUA will remain in effect (meaning this test can be used) for the duration of the COVID-19 declaration under Section 564(b)(1) of the Act, 21 U.S.C. section 360bbb-3(b)(1), unless the authorization is terminated or revoked.     Resp Syncytial Virus by PCR NEGATIVE NEGATIVE    Comment: (NOTE) Fact Sheet for Patients: EntrepreneurPulse.com.au  Fact Sheet for Healthcare Providers: IncredibleEmployment.be  This test is not yet approved or cleared by the [place] and has been authorized for detection and/or diagnosis of SARS-CoV-2 by FDA under an Emergency Use Authorization (EUA). This EUA will remain in effect (meaning this test can be used) for the duration of the COVID-19 declaration under Section 564(b)(1) of the Act, 21 U.S.C. section 360bbb-3(b)(1), unless the authorization is terminated or revoked.  Performed at [facility], [place] [address], [place], [place] [address]   CBC     Status: Abnormal   Collection Time: 05/30/22  6:08 AM  Result Value Ref Range   WBC 12.0 (H) 4.0 - 10.5 K/uL   RBC 4.31 3.87 - 5.11 MIL/uL   Hemoglobin 12.6 12.0 - 15.0 g/dL   HCT 37.1 36.0 - 46.0 %   MCV 86.1 80.0 - 100.0 fL   MCH 29.2 26.0 - 34.0 pg   MCHC 34.0 30.0 - 36.0 g/dL   RDW 14.0 11.5 - 15.5 %   Platelets 234 150 - 400 K/uL   nRBC 0.0 0.0 - 0.2 %    Comment: Performed at [facility], [place] [address] [place], [place] [address]  Basic metabolic panel     Status: Abnormal   Collection Time: 05/30/22  6:08 AM  Result Value Ref Range   Sodium 133 (L) 135 - 145 mmol/L   Potassium 3.3 (L) 3.5 - 5.1 mmol/L   Chloride 101 98 - 111 mmol/L   CO2 21 (L) 22 - 32 mmol/L   Glucose, Bld 131 (H) 70 - 99 mg/dL    Comment: Glucose reference range applies only to samples taken after fasting for at  least 8 hours.   BUN 7 (L) 8 - 23 mg/dL   Creatinine, Ser 0.62 0.44 - 1.00 mg/dL   Calcium 9.2 8.9 - 10.3 mg/dL   GFR, Estimated >60 >60 mL/min    Comment: (NOTE) Calculated using the CKD-EPI Creatinine Equation (2021)    Anion gap 11 5 - 15    Comment: Performed at [place] [address] [place], [place] [address]  CBG monitoring, ED     Status: Abnormal   Collection Time: 05/30/22  7:52 AM  Result Value Ref Range   Glucose-Capillary 150 (H) 70 - 99 mg/dL    Comment: Glucose reference range applies only to samples taken after fasting for at least 8 hours.  Magnesium     Status: None   Collection Time: 05/30/22  9:00 AM  Result Value Ref Range   Magnesium 1.8 1.7 - 2.4 mg/dL    Comment: Performed at [facility], [place] [address], [place], [place] [address]  Vitamin B12     Status: None   Collection Time: 05/30/22  9:08 AM  Result Value Ref Range   Vitamin B-12 187 180 - 914 pg/mL    Comment: (NOTE) This assay is not validated for testing neonatal or myeloproliferative syndrome specimens for Vitamin B12 levels. Performed at [facility], [place] [address] [place], [place] [address]   TSH     Status: None   Collection Time: 05/30/22  9:08 AM  Result Value Ref Range   TSH 1.594 0.350 - 4.500 uIU/mL    Comment: Performed by a 3rd Generation assay with a functional sensitivity of <=0.01 uIU/mL. Performed at [facility], [place] [address] [place], [place] [address]    MR BRAIN WO CONTRAST  Result Date: 05/30/2022 CLINICAL DATA:  Altered mental status EXAM:  MRI HEAD WITHOUT CONTRAST TECHNIQUE: Multiplanar, multiecho pulse sequences of the brain and surrounding structures were obtained without intravenous contrast. COMPARISON:  None Available. FINDINGS: Brain: No acute infarction, hemorrhage, hydrocephalus, extra-axial collection or mass lesion. Multifocal chronic microhemorrhage (2-3 foci) in a mixed central and peripheral distribution. There is confluent  hyperintense T2-weighted signal within the periventricular and deep white matter. 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 Generalized volume loss. Normal midline structures. Vascular: Normal flow voids. Skull and upper cervical spine: Normal marrow signal. Sinuses/Orbits: Left maxillary sinus retention cyst. Normal orbits. Other: None. IMPRESSION: 1. No acute intracranial abnormality. 2. 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 3. Advanced chronic microvascular ischemic changes and volume loss. Electronically Signed   By: Ulyses Jarred M.D.   On: 05/30/2022 03:20   CT HEAD WO CONTRAST  Result Date: 05/29/2022 CLINICAL DATA:  Mental status changes. Confusion. EXAM: CT HEAD WITHOUT CONTRAST TECHNIQUE: Contiguous axial images were obtained from the base of the skull through the vertex without intravenous contrast. RADIATION DOSE REDUCTION: This exam was performed according to the departmental dose-optimization program which includes automated exposure control, adjustment of the mA and/or kV according to patient size and/or use of iterative reconstruction technique. COMPARISON:  03/23/2021 FINDINGS: Brain: There is no evidence for acute hemorrhage, hydrocephalus, mass lesion, or abnormal extra-axial fluid collection. No definite CT evidence for acute infarction. Similar appearance of right parietal and left PCA territory infarcts. Diffuse loss of parenchymal volume is consistent with atrophy. Patchy low attenuation in the deep hemispheric and periventricular white matter is nonspecific, but likely reflects chronic microvascular ischemic demyelination. Vascular: No hyperdense vessel or unexpected calcification. Skull: No evidence for fracture. No worrisome lytic or sclerotic lesion. Sinuses/Orbits: Chronic polypoid mucosal thickening in the left maxillary sinus is unchanged. Remaining visualized paranasal sinuses and mastoid air cells  are clear. Visualized portions of the globes and intraorbital fat are unremarkable. Other: None. IMPRESSION: 1. No acute intracranial abnormality. 2. Old right parietal and left PCA territory infarcts. 3. Atrophy with chronic small vessel ischemic disease. Electronically Signed   By: Misty Stanley M.D.   On: 05/29/2022 18:13   DG Chest Port 1 View  Result Date: 05/29/2022 CLINICAL DATA:  Altered mental status. EXAM: PORTABLE CHEST 1 VIEW COMPARISON:  June 21, 2020 FINDINGS: The cardiac silhouette is markedly enlarged and unchanged in size. Mild, diffuse, chronic appearing increased lung markings are noted. There is no evidence of an acute infiltrate, pleural effusion or pneumothorax. The visualized skeletal structures are unremarkable. IMPRESSION: Marked severity cardiomegaly without evidence of acute or active cardiopulmonary disease. Electronically Signed   By: Virgina Norfolk M.D.   On: 05/29/2022 16:21    Pending Labs Unresulted Labs (From admission, onward)     Start     Ordered   05/31/22 0500  Cortisol-am, blood  Tomorrow morning,   R        05/30/22 0908   05/29/22 1544  Urine Culture  Once,   URGENT       Question:  Indication  Answer:  Altered mental status (if no other cause identified)   05/29/22 1543            Vitals/Pain Today's Vitals   05/30/22 1030 05/30/22 1200 05/30/22 1223 05/30/22 1224  BP: (!) 154/78 (!) 146/129    Pulse: 93 (!) 55    Resp: 18 17    Temp: 98.8 F (37.1 C)   98.7 F (  37.1 C)  TempSrc: Oral   Oral  SpO2: 96% 95%    Weight:      Height:      PainSc:   0-No pain     Isolation Precautions No active isolations  Medications Medications  LORazepam (ATIVAN) tablet 0.5 mg (has no administration in time range)  sodium chloride flush (NS) 0.9 % injection 3 mL (3 mLs Intravenous Given 05/30/22 1032)  acetaminophen (TYLENOL) tablet 650 mg ( Oral See Alternative 05/30/22 0748)    Or  acetaminophen (TYLENOL) suppository 650 mg (650 mg Rectal  Given 05/30/22 0748)  lactated ringers infusion (0 mLs Intravenous Stopped 05/30/22 1030)  ondansetron (ZOFRAN) tablet 4 mg ( Oral See Alternative 05/30/22 0751)    Or  ondansetron (ZOFRAN) injection 4 mg (4 mg Intravenous Given 05/30/22 0751)  senna-docusate (Senokot-S) tablet 1 tablet (has no administration in time range)  cefTRIAXone (ROCEPHIN) 1 g in sodium chloride 0.9 % 100 mL IVPB (has no administration in time range)  apixaban (ELIQUIS) tablet 5 mg (5 mg Oral Given 05/30/22 1035)  escitalopram (LEXAPRO) tablet 5 mg (5 mg Oral Given 05/30/22 1034)  metoprolol tartrate (LOPRESSOR) tablet 25 mg (25 mg Oral Given 05/30/22 1033)  pravastatin (PRAVACHOL) tablet 20 mg (20 mg Oral Given 05/29/22 2324)  hydrochlorothiazide (MICROZIDE) capsule 12.5 mg (12.5 mg Oral Not Given 05/30/22 1035)  gabapentin (NEURONTIN) capsule 100 mg (100 mg Oral Given 05/30/22 1034)  hydrALAZINE (APRESOLINE) injection 10 mg (has no administration in time range)  0.9 % NaCl with KCl 20 mEq/ L  infusion ( Intravenous New Bag/Given 05/30/22 1032)  cefTRIAXone (ROCEPHIN) 1 g in sodium chloride 0.9 % 100 mL IVPB (0 g Intravenous Stopped 05/29/22 2102)  LORazepam (ATIVAN) tablet 1 mg (1 mg Oral Given 05/30/22 0104)    Mobility    Pt normally ambulates on her own.  However, with PT pt was only able to shuffle with assistance.     Focused Assessments Neuro Assessment Handoff:  Swallow screen pass? Yes  Cardiac Rhythm: Atrial fibrillation NIH Stroke Scale  Dizziness Present: No Headache Present: Yes Interval: Initial Level of Consciousness (1a.)   : Alert, keenly responsive LOC Questions (1b. )   : Answers neither question correctly LOC Commands (1c. )   : Performs both tasks correctly Best Gaze (2. )  : Normal Visual (3. )  : No visual loss Facial Palsy (4. )    : Normal symmetrical movements Motor Arm, Left (5a. )   : No drift Motor Arm, Right (5b. ) : No drift Motor Leg, Left (6a. )  : No drift Motor Leg, Right (6b.  ) : No drift Limb Ataxia (7. ): Absent Sensory (8. )  : Normal, no sensory loss Best Language (9. )  : No aphasia Dysarthria (10. ): Mild-to-moderate dysarthria, patient slurs at least some words and, at worst, can be understood with some difficulty Extinction/Inattention (11.)   : No Abnormality Complete NIHSS TOTAL: 3     Neuro Assessment: Exceptions to WDL Neuro Checks:   Initial (05/29/22 2101)  Has TPA been given? No If patient is a Neuro Trauma and patient is going to OR before floor call report to [facility] nurse: [phone] or [phone]   R Recommendations: See Admitting Provider Note  Report given to:

## 2022-05-30 NOTE — ED Notes (Signed)
Pt given orange juice and apple sauce, repositioned in bed.

## 2022-05-30 NOTE — ED Notes (Signed)
Pt sleeping.  Woke pt up and attempted to assister her to eat lunch.  Pt refused to eat and laid back in the bed to sleep.

## 2022-05-30 NOTE — ED Notes (Signed)
Pt transferred to hospital bed, bed alarm turned on

## 2022-05-30 NOTE — ED Notes (Signed)
Update given to patient's son, Tanara Turvey, [phone] .

## 2022-05-30 NOTE — ED Notes (Signed)
Completed neuro assessment; pt appears to be sundowning. Repositioned, replaced purewick, applied clean brief, rearranged bed linens, assisted pt to comfortable position. Dimmed lights and will continue to monitor closely. Notified provider of pt's behavior.
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
## 2022-05-30 NOTE — ED Notes (Signed)
When asked orientation questions pt continually states "I'm sick" will not answer any questions.

## 2022-05-31 DIAGNOSIS — G9341 Metabolic encephalopathy: Secondary | ICD-10-CM | POA: Diagnosis not present

## 2022-05-31 LAB — URINE CULTURE: Culture: >=100000 — AB

## 2022-05-31 LAB — CORTISOL-AM, BLOOD: Cortisol - AM: 9.8 ug/dL (ref 6.7–22.6)

## 2022-05-31 MED ORDER — CYANOCOBALAMIN 1000 MCG/ML IJ SOLN: 1000.0000 ug | Freq: Every day | INTRAMUSCULAR | Status: DC

## 2022-05-31 MED ORDER — CEPHALEXIN 500 MG PO CAPS: 500.0000 mg | ORAL_CAPSULE | Freq: Two times a day (BID) | ORAL | Status: DC

## 2022-05-31 MED ORDER — CEPHALEXIN 500 MG PO CAPS: 500.0000 mg | ORAL_CAPSULE | Freq: Two times a day (BID) | ORAL | 0 refills | Status: DC

## 2022-05-31 MED ORDER — ORAL CARE MOUTH RINSE: 15.0000 mL | OROMUCOSAL | Status: DC | PRN

## 2022-05-31 MED ORDER — VITAMIN B-12 1000 MCG PO TABS: 1000.0000 ug | ORAL_TABLET | Freq: Every day | ORAL | 1 refills | Status: DC

## 2022-05-31 MED ORDER — COSYNTROPIN 0.25 MG IJ SOLR
0.2500 mg | Freq: Once | INTRAMUSCULAR | Status: DC
  Filled 2022-05-31: qty 0.25

## 2022-05-31 NOTE — Discharge Summary (Signed)
Samantha Bishop:[id number] DOB: 1924/11/05 DOA: 05/29/2022  PCP: Hoyt Koch, MD  Admit date: 05/29/2022 Discharge date: 05/31/2022  Time spent: 35 minutes  Recommendations for Outpatient Follow-up:  Pcp f/u 1-2 weeks Consider dose reduction of gabapentin  Consider w/u of low cortisol level Monitor response to b12 supplementation    Discharge Diagnoses:  Principal Problem:   Acute metabolic encephalopathy Active Problems:   Permanent atrial fibrillation (HCC)   Essential hypertension   Hyperlipidemia   Adjustment disorder with mixed anxiety and depressed mood   History of CVA (cerebrovascular accident)   COVID   COVID-19   Discharge Condition: improved  Diet recommendation: heart healthy  Filed Weights   05/30/22 0736  Weight: 78.9 kg    History of present illness:  From admission h and p Samantha Bishop is a 89 y.o. female with medical history significant for permanent atrial fibrillation on Eliquis, history of CVA, HTN, HLD, anxiety who presented to the ED from [facility] assisted living facility for altered mental status.   Patient was seen by her PCP on 05/19/2022 with dysuria and URI symptoms.  She was found to have a UTI and tested positive for COVID.  She was started on 7-day course of Keflex and completed course of Paxlovid.  Urine culture grew Proteus resistant to nitrofurantoin but otherwise sensitive.  Patient held Eliquis until Paxlovid course was completed and has since restarted.   Patient states that she has been isolating due to the [place] outbreak at her facility.  She has just been feeling increasingly lethargic and generally weak.  She reports urinary frequency.  She had reported dysuria earlier.  She says she saw some small spots of blood on toilet paper after bowel movement but no large bloody bowel movements.  She does report a history of hemorrhoids.  Hospital Course:   # Delirium Likely 2/2 covid infection. Possible UTI. No hepatic or  kidney dysfunction, no significant electrolyte abnormality, not hypoglycemic, no report of seizure-like activity, MRI negative for acute process. B12 is low. Symptoms resolved by hospital day one. She appears to be back to baseline. - declines snf, hh pt/ot ordered   # Low cortisol - consider outpt w/u   # B12 deficiency - start b12 supplementation   # Covid-19 Tested positive on 1/10, unclear when symptoms started. Treated with course molnupiravir. No hypoxia or dyspnea to warrant steroids, CXR clear.   # UTI? Recently treated for proteus uti with keflex. Urinalysis equivocal for infection. Patient reported frequency and dysuria to admitting provider. Culture growing klebsiella cefazolin sensitive. - ceftriaxone started 1/20, will stop and start keflex plan for 7 days course given recent uti   # Neuropathy - consider outpt dose reduction   # HTN Here BPs severely elevated to 200s, now improved to 150s - cont home meds   # A-fib Rate controlled - cont home metop and apixaban   # Hx CVA Old infarcts seen on MRI, nothing acute - cont home apixaban, statin   # Presbycusis Noted   # GAD - cont home excitalopram 5   # Hematochezia Hx hemorrhoids, per admitting provider report of few drops of red blood. Hgb wnl, no report of bleeding while inpatient  Procedures: none   Consultations: none  Discharge Exam: Vitals:   05/31/22 0414 05/31/22 0731  BP: (!) 148/57 (!) 155/86  Pulse: (!) 50 92  Resp: 16 16  Temp: 97.9 F (36.6 C) 98 F (36.7 C)  SpO2: 96% 98%    General  exam: mild confusion Respiratory system: Clear to auscultation. Respiratory effort normal. Cardiovascular system: S1 & S2 heard, irreg irreg, soft systolic murmur Gastrointestinal system: Abdomen is obese, soft and nontender. No organomegaly or masses felt. Normal bowel sounds heard. Central nervous system: awake, moving all 4, hard of hearing Extremities: Symmetric 5 x 5 power. Skin: No rashes,  lesions or ulcers Psychiatry: calm  Discharge Instructions   Discharge Instructions     Diet - low sodium heart healthy   Complete by: As directed    Increase activity slowly   Complete by: As directed       Allergies as of 05/31/2022       Reactions   Codeine Nausea Only   Penicillins Rash        Medication List     TAKE these medications    apixaban 5 MG Tabs tablet Commonly known as: Eliquis TAKE 1 TABLET (5 MG TOTAL) BY MOUTH 2 (TWO) TIMES DAILY. What changed:  how much to take how to take this when to take this   cephALEXin 500 MG capsule Commonly known as: KEFLEX Take 1 capsule (500 mg total) by mouth every 12 (twelve) hours.   escitalopram 5 MG tablet Commonly known as: Lexapro Take 1 tablet (5 mg total) by mouth daily.   gabapentin 300 MG capsule Commonly known as: NEURONTIN Take 2 capsules (600 mg total) by mouth 3 (three) times daily.   hydrochlorothiazide 12.5 MG capsule Commonly known as: MICROZIDE TAKE 1 CAPSULE(12.5 MG) BY MOUTH DAILY What changed:  how much to take how to take this when to take this additional instructions   metoprolol tartrate 25 MG tablet Commonly known as: LOPRESSOR Take 1 tablet (25 mg total) by mouth 2 (two) times daily.   multivitamin with minerals Tabs tablet Take 1 tablet by mouth daily.   pravastatin 20 MG tablet Commonly known as: PRAVACHOL TAKE 1 TABLET(20 MG) BY MOUTH DAILY AT 6 PM What changed:  how much to take how to take this when to take this additional instructions   sodium chloride 0.65 % Soln nasal spray Commonly known as: OCEAN Place 1 spray into both nostrils as needed for congestion. What changed: when to take this       Allergies  Allergen Reactions   Codeine Nausea Only   Penicillins Rash    Follow-up Information     Hoyt Koch, MD Follow up.   Specialty: Internal Medicine Contact information: [place] [address] [phone]                   The results of significant diagnostics from this hospitalization (including imaging, microbiology, ancillary and laboratory) are listed below for reference.    Significant Diagnostic Studies: MR BRAIN WO CONTRAST  Result Date: 05/30/2022 CLINICAL DATA:  Altered mental status EXAM: MRI HEAD WITHOUT CONTRAST TECHNIQUE: Multiplanar, multiecho pulse sequences of the brain and surrounding structures were obtained without intravenous contrast. COMPARISON:  None Available. FINDINGS: Brain: No acute infarction, hemorrhage, hydrocephalus, extra-axial collection or mass lesion. Multifocal chronic microhemorrhage (2-3 foci) in a mixed central and peripheral distribution. There is confluent hyperintense T2-weighted signal within the periventricular and deep white matter. 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 Generalized volume loss. Normal midline structures. Vascular: Normal flow voids. Skull and upper cervical spine: Normal marrow signal. Sinuses/Orbits: Left maxillary sinus retention cyst. Normal orbits. Other: None. IMPRESSION: 1. No acute intracranial abnormality. 2. Old infarcts of the right  parietal lobe, left occipital lobe and left cerebellum. 3. Advanced chronic microvascular ischemic changes and volume loss. Electronically Signed   By: Ulyses Jarred M.D.   On: 05/30/2022 03:20   CT HEAD WO CONTRAST  Result Date: 05/29/2022 CLINICAL DATA:  Mental status changes. Confusion. EXAM: CT HEAD WITHOUT CONTRAST TECHNIQUE: Contiguous axial images were obtained from the base of the skull through the vertex without intravenous contrast. RADIATION DOSE REDUCTION: This exam was performed according to the departmental dose-optimization program which includes automated exposure control, adjustment of the mA and/or kV according to patient size and/or use of iterative reconstruction technique. COMPARISON:  03/23/2021 FINDINGS: Brain: There is no evidence for  acute hemorrhage, hydrocephalus, mass lesion, or abnormal extra-axial fluid collection. No definite CT evidence for acute infarction. Similar appearance of right parietal and left PCA territory infarcts. Diffuse loss of parenchymal volume is consistent with atrophy. Patchy low attenuation in the deep hemispheric and periventricular white matter is nonspecific, but likely reflects chronic microvascular ischemic demyelination. Vascular: No hyperdense vessel or unexpected calcification. Skull: No evidence for fracture. No worrisome lytic or sclerotic lesion. Sinuses/Orbits: Chronic polypoid mucosal thickening in the left maxillary sinus is unchanged. Remaining visualized paranasal sinuses and mastoid air cells are clear. Visualized portions of the globes and intraorbital fat are unremarkable. Other: None. IMPRESSION: 1. No acute intracranial abnormality. 2. Old right parietal and left PCA territory infarcts. 3. Atrophy with chronic small vessel ischemic disease. Electronically Signed   By: Misty Stanley M.D.   On: 05/29/2022 18:13   DG Chest Port 1 View  Result Date: 05/29/2022 CLINICAL DATA:  Altered mental status. EXAM: PORTABLE CHEST 1 VIEW COMPARISON:  June 21, 2020 FINDINGS: The cardiac silhouette is markedly enlarged and unchanged in size. Mild, diffuse, chronic appearing increased lung markings are noted. There is no evidence of an acute infiltrate, pleural effusion or pneumothorax. The visualized skeletal structures are unremarkable. IMPRESSION: Marked severity cardiomegaly without evidence of acute or active cardiopulmonary disease. Electronically Signed   By: Virgina Norfolk M.D.   On: 05/29/2022 16:21    Microbiology: Recent Results (from the past 240 hour(s))  Blood Culture (Routine X 2)     Status: None (Preliminary result)   Collection Time: 05/29/22  4:25 PM   Specimen: BLOOD LEFT FOREARM  Result Value Ref Range Status   Specimen Description BLOOD LEFT FOREARM  Final   Special Requests    Final    BOTTLES DRAWN AEROBIC AND ANAEROBIC Blood Culture adequate volume   Culture   Final    NO GROWTH 2 DAYS Performed at [facility], [place] [address], [place], [place] [address]    Report Status PENDING  Incomplete  Urine Culture     Status: Abnormal   Collection Time: 05/29/22  4:33 PM   Specimen: Urine, Clean Catch  Result Value Ref Range Status   Specimen Description URINE, CLEAN CATCH  Final   Special Requests   Final    NONE Performed at [facility], [place] [address], [place], [place] [address]    Culture >=100,000 COLONIES/mL KLEBSIELLA PNEUMONIAE (A)  Final   Report Status 05/31/2022 FINAL  Final   Organism ID, Bacteria KLEBSIELLA PNEUMONIAE (A)  Final      Susceptibility   Klebsiella pneumoniae - MIC*    AMPICILLIN >=32 RESISTANT Resistant     CEFAZOLIN <=4 SENSITIVE Sensitive     CEFEPIME <=0.12 SENSITIVE Sensitive     CEFTRIAXONE <=0.25 SENSITIVE Sensitive     CIPROFLOXACIN <=0.25  SENSITIVE Sensitive     GENTAMICIN <=1 SENSITIVE Sensitive     IMIPENEM <=0.25 SENSITIVE Sensitive     NITROFURANTOIN 128 RESISTANT Resistant     TRIMETH/SULFA <=20 SENSITIVE Sensitive     AMPICILLIN/SULBACTAM 4 SENSITIVE Sensitive     PIP/TAZO <=4 SENSITIVE Sensitive     * >=100,000 COLONIES/mL KLEBSIELLA PNEUMONIAE  Blood Culture (Routine X 2)     Status: None (Preliminary result)   Collection Time: 05/29/22  5:27 PM   Specimen: BLOOD  Result Value Ref Range Status   Specimen Description BLOOD SITE NOT SPECIFIED  Final   Special Requests   Final    BOTTLES DRAWN AEROBIC AND ANAEROBIC Blood Culture adequate volume   Culture   Final    NO GROWTH 2 DAYS Performed at [facility], [place] [address], [place], [place] [address]    Report Status PENDING  Incomplete  Resp panel by RT-PCR (RSV, Flu A&B, Covid) Anterior Nasal Swab     Status: Abnormal   Collection Time: 05/29/22  9:35 PM   Specimen: Anterior Nasal Swab  Result Value Ref Range Status   SARS Coronavirus  2 by RT PCR POSITIVE (A) NEGATIVE Final    Comment: (NOTE) SARS-CoV-2 target nucleic acids are DETECTED.  The SARS-CoV-2 RNA is generally detectable in upper respiratory specimens during the acute phase of infection. Positive results are indicative of the presence of the identified virus, but do not rule out bacterial infection or co-infection with other pathogens not detected by the test. Clinical correlation with patient history and other diagnostic information is necessary to determine patient infection status. The expected result is Negative.  Fact Sheet for Patients: EntrepreneurPulse.com.au  Fact Sheet for Healthcare Providers: IncredibleEmployment.be  This test is not yet approved or cleared by the [place] FDA and  has been authorized for detection and/or diagnosis of SARS-CoV-2 by FDA under an Emergency Use Authorization (EUA).  This EUA will remain in effect (meaning this test can be used) for the duration of  the COVID-19 declaration under Section 564(b)(1) of the A ct, 21 U.S.C. section 360bbb-3(b)(1), unless the authorization is terminated or revoked sooner.     Influenza A by PCR NEGATIVE NEGATIVE Final   Influenza B by PCR NEGATIVE NEGATIVE Final    Comment: (NOTE) The Xpert Xpress SARS-CoV-2/FLU/RSV plus assay is intended as an aid in the diagnosis of influenza from Nasopharyngeal swab specimens and should not be used as a sole basis for treatment. Nasal washings and aspirates are unacceptable for Xpert Xpress SARS-CoV-2/FLU/RSV testing.  Fact Sheet for Patients: EntrepreneurPulse.com.au  Fact Sheet for Healthcare Providers: IncredibleEmployment.be  This test is not yet approved or cleared by the [place] FDA and has been authorized for detection and/or diagnosis of SARS-CoV-2 by FDA under an Emergency Use Authorization (EUA). This EUA will remain in effect (meaning this test  can be used) for the duration of the COVID-19 declaration under Section 564(b)(1) of the Act, 21 U.S.C. section 360bbb-3(b)(1), unless the authorization is terminated or revoked.     Resp Syncytial Virus by PCR NEGATIVE NEGATIVE Final    Comment: (NOTE) Fact Sheet for Patients: EntrepreneurPulse.com.au  Fact Sheet for Healthcare Providers: IncredibleEmployment.be  This test is not yet approved or cleared by the [place] FDA and has been authorized for detection and/or diagnosis of SARS-CoV-2 by FDA under an Emergency Use Authorization (EUA). This EUA will remain in effect (meaning this test can be used) for the duration of the COVID-19 declaration under Section 564(b)(1)  of the Act, 21 U.S.C. section 360bbb-3(b)(1), unless the authorization is terminated or revoked.  Performed at [facility], [place] [address], [place], [place] [address]      Labs: Basic Metabolic Panel: Recent Labs  Lab 05/29/22 1625 05/29/22 1736 05/30/22 0608 05/30/22 0900  NA 138 141 133*  --   K 3.6 3.4* 3.3*  --   CL 103  --  101  --   CO2 23  --  21*  --   GLUCOSE 108*  --  131*  --   BUN 8  --  7*  --   CREATININE 0.58  --  0.62  --   CALCIUM 9.8  --  9.2  --   MG  --   --   --  1.8   Liver Function Tests: Recent Labs  Lab 05/29/22 1625  AST 21  ALT 12  ALKPHOS 48  BILITOT 0.2*  PROT 7.8  ALBUMIN 4.1   No results for input(s): "LIPASE", "AMYLASE" in the last 168 hours. Recent Labs  Lab 05/29/22 1625  AMMONIA 19   CBC: Recent Labs  Lab 05/29/22 1625 05/29/22 1736 05/30/22 0608  WBC 8.6  --  12.0*  NEUTROABS 5.8  --   --   HGB 12.1 12.2 12.6  HCT 36.8 36.0 37.1  MCV 88.0  --  86.1  PLT 252  --  234   Cardiac Enzymes: No results for input(s): "CKTOTAL", "CKMB", "CKMBINDEX", "TROPONINI" in the last 168 hours. BNP: BNP (last 3 results) Recent Labs    03/13/22 1812 05/29/22 1650  BNP 254.0* 196.2*    ProBNP (last 3  results) No results for input(s): "PROBNP" in the last 8760 hours.  CBG: Recent Labs  Lab 05/29/22 1721 05/30/22 0752  GLUCAP 100* 150*       Signed:  Desma Maxim MD.  [facility] 05/31/2022, 3:37 PM

## 2022-05-31 NOTE — Evaluation (Signed)
Occupational Therapy Evaluation Patient Details Name: Samantha Bishop MRN: [id number] DOB: 1924-08-11 Today's Date: 05/31/2022   History of Present Illness Samantha Bishop is a 89 y.o. female who presented to the ED from [facility] facility for altered mental status, covid+. with medical history significant for permanent atrial fibrillation on Eliquis, history of CVA, HTN, HLD, anxiety   Clinical Impression   Prior to this admission, patient living at [facility] and able to complete all ADLs independently, walked with her rollator to the dining area, and is able to make simple meals in her room without assist. Currently, patient minimally disoriented (did not know why she was in the hospital, but otherwise oriented) and needing increased assist to complete ADLs (primarily due to urinary incontinence limiting OOB activity x2). Patient able to complete bed mobility at min guard level, and mod A to stand x2. Patient also mod A for ADL management. Due to improved cognition with OT, OT recommending HHOT vs SNF and a return to familiar environment once medically stable. OT will continue to follow.      Recommendations for follow up therapy are one component of a multi-disciplinary discharge planning process, led by the attending physician.  Recommendations may be updated based on patient status, additional functional criteria and insurance authorization.   Follow Up Recommendations  Home health OT     Assistance Recommended at Discharge Intermittent Supervision/Assistance  Patient can return home with the following A little help with walking and/or transfers;A little help with bathing/dressing/bathroom;Direct supervision/assist for medications management;Direct supervision/assist for financial management;Assist for transportation;Help with stairs or ramp for entrance    Functional Status Assessment  Patient has had a recent decline in their functional status and demonstrates the  ability to make significant improvements in function in a reasonable and predictable amount of time.  Equipment Recommendations  None recommended by OT (Patient has DME needed)    Recommendations for Other Services       Precautions / Restrictions Precautions Precautions: Fall      Mobility Bed Mobility Overal bed mobility: Needs Assistance Bed Mobility: Supine to Sit, Sit to Supine     Supine to sit: Min guard Sit to supine: Min guard   General bed mobility comments: Able to use bed rail (minimally) and sit EOB without assist from OT, also able to minimally bridge and scoot herself up toward [facility]    Transfers Overall transfer level: Needs assistance Equipment used: 1 person hand held assist Transfers: Sit to/from Stand, Bed to chair/wheelchair/BSC Sit to Stand: Mod assist, Min assist           General transfer comment: Mod assist to power up to standing and stabilize; stood twice from EOB; Min A the second time with use of RW, urinary incontinence despite purewick in place therefore      Balance Overall balance assessment: Needs assistance Sitting-balance support: Feet supported Sitting balance-Leahy Scale: Fair     Standing balance support: Single extremity supported, During functional activity Standing balance-Leahy Scale: Poor Standing balance comment: reliant on external assist                           ADL either performed or assessed with clinical judgement   ADL Overall ADL's : Needs assistance/impaired Eating/Feeding: Set up;Sitting   Grooming: Set up;Sitting   Upper Body Bathing: Min guard;Sitting   Lower Body Bathing: Moderate assistance;Sitting/lateral leans;Sit to/from stand   Upper Body Dressing : Min guard;Sitting  Lower Body Dressing: Moderate assistance;Sit to/from stand;Sitting/lateral leans   Toilet Transfer: Moderate assistance;Ambulation;Rolling walker (2 wheels) Toilet Transfer Details (indicate cue type and reason):  simluated with sit<>stands, 2 bouts of urinary incontinence preventing further ambulation Toileting- Clothing Manipulation and Hygiene: Total assistance;Bed level Toileting - Clothing Manipulation Details (indicate cue type and reason): due to two bouts of urinary incontinence     Functional mobility during ADLs: Moderate assistance;Cueing for safety;Cueing for sequencing;Rolling walker (2 wheels) General ADL Comments: Patient presenting with minimal confusion (appears to be clearing in comparison to PT eval) urinary incontinence, and increased assist for ADL management     Vision Baseline Vision/History: 0 No visual deficits Ability to See in Adequate Light: 0 Adequate Patient Visual Report: No change from baseline       Perception     Praxis      Pertinent Vitals/Pain       Hand Dominance Right   Extremity/Trunk Assessment Upper Extremity Assessment Upper Extremity Assessment: Generalized weakness   Lower Extremity Assessment Lower Extremity Assessment: Defer to PT evaluation   Cervical / Trunk Assessment Cervical / Trunk Assessment: Kyphotic (minimally)   Communication Communication Communication: HOH   Cognition Arousal/Alertness: Awake/alert Behavior During Therapy: WFL for tasks assessed/performed Overall Cognitive Status: Impaired/Different from baseline Area of Impairment: Orientation, Attention, Following commands, Safety/judgement, Awareness, Memory                 Orientation Level: Disoriented to, Situation Current Attention Level: Sustained Memory: Decreased short-term memory Following Commands: Follows one step commands inconsistently (and some reminders) Safety/Judgement: Decreased awareness of safety, Decreased awareness of deficits Awareness: Emergent   General Comments: More coherent in session, needing occasional redirection however could be due to significant [facility]     General Comments       Exercises     Shoulder Instructions       Home Living Family/patient expects to be discharged to:: Assisted living (Though sounds more like [facility] is an ILF; provides housekeeping, laundry, and dining room services) Living Arrangements: Alone Available Help at Discharge: Available PRN/intermittently;Other (Comment) (Staff assists with laundry, housekeeping) Type of Home: Apartment (at ALF) Home Access: Elevator     Home Layout: One level     Bathroom Shower/Tub: Occupational psychologist: Standard     Home Equipment: Rollator (4 wheels);Cane - single point;Shower seat;Grab bars - toilet;Grab bars - tub/shower   Additional Comments: pt lives in 2nd floor apt at independent living facility      Prior Functioning/Environment Prior Level of Function : Independent/Modified Independent             Mobility Comments: reports no falls, uses cane for errands and MD appointments, uses rollator to walk to dining hall, no DME in her apt; son reports she has recently considered giving up driving ADLs Comments: pt reports independence        OT Problem List: Decreased strength;Decreased activity tolerance;Impaired balance (sitting and/or standing);Decreased cognition;Decreased safety awareness      OT Treatment/Interventions: Self-care/ADL training;Therapeutic exercise;Energy conservation;DME and/or AE instruction;Cognitive remediation/compensation;Patient/family education;Balance training;Therapeutic activities    OT Goals(Current goals can be found in the care plan section) Acute Rehab OT Goals Patient Stated Goal: to get out of here OT Goal Formulation: With patient Time For Goal Achievement: 06/14/22 Potential to Achieve Goals: Good  OT Frequency: Min 2X/week    Co-evaluation              AM-PAC OT "6 Clicks" Daily Activity  Outcome Measure Help from another person eating meals?: A Little Help from another person taking care of personal grooming?: A Little Help from another person toileting,  which includes using toliet, bedpan, or urinal?: A Lot Help from another person bathing (including washing, rinsing, drying)?: A Lot Help from another person to put on and taking off regular upper body clothing?: A Little Help from another person to put on and taking off regular lower body clothing?: A Lot 6 Click Score: 15   End of Session Equipment Utilized During Treatment: Gait belt;Rolling walker (2 wheels) Nurse Communication: Mobility status  Activity Tolerance: Patient tolerated treatment well Patient left: in bed;with call bell/phone within reach;with bed alarm set  OT Visit Diagnosis: Unsteadiness on feet (R26.81);Other abnormalities of gait and mobility (R26.89);Other symptoms and signs involving cognitive function                Time: 7416-3845 OT Time Calculation (min): 25 min Charges:  OT General Charges $OT Visit: 1 Visit OT Evaluation $OT Eval Moderate Complexity: 1 Mod OT Treatments $Self Care/Home Management : 8-22 mins  Corinne Ports E. Neylan Koroma, OTR/L Acute Rehabilitation Services [phone]   Shakti Fleer 05/31/2022, 2:37 PM

## 2022-05-31 NOTE — TOC Initial Note (Signed)
Transition of Care [facility]) - Initial/Assessment Note    Patient Details  Name: Samantha Bishop MRN: [id number] Date of Birth: 08-Dec-1924  [facility] of [facility]) CM/SW Contact:    Geralynn Ochs, LCSW Phone Number: 05/31/2022, 2:27 PM  Clinical Narrative:           CSW spoke with patient's son, [name], about recommendation for SNF as patient is documented as not fully oriented. Per [name], she does not allow family to make decisions for her and is very independent, so he asked CSW to discuss with the patient. CSW explained that the patient is confused and seems unable to make her own decisions at this time, so the family would need to step in. Patient does not have a documented POA in the event that she is unable to make her own decisions. [name] indicated that if the patient does have the ability to make her own decisions, then she will refuse SNF and will want to return to [facility]. CSW explained concern that [facility] may not be able to care for the patient at her current functional level. David in agreement with SNF and said he'd talk with his siblings, will continue to monitor patient's mental status. CSW relayed concerns to MD, asked for possible cognitive evaluation. CSW to follow.        Expected Discharge Plan: Skilled Nursing Facility Barriers to Discharge: Continued Medical Work up, Ship broker   Patient Goals and CMS Choice Patient states their goals for this hospitalization and ongoing recovery are:: patient unable to participate in goal setting, not fully oriented CMS Medicare.gov Compare Post Acute Care list provided to:: Patient Represenative (must comment) Choice offered to / list presented to : Adult [place] ownership interest in [facility].provided to:: Adult Children    Expected Discharge Plan and Services     Post Acute Care Choice: NA Living arrangements for the past 2 months: [place]                                       Prior Living Arrangements/Services Living arrangements for the past 2 months: [place] Lives with:: Facility Resident Patient language and need for interpreter reviewed:: No Do you feel safe going back to the place where you live?: Yes      Need for Family Participation in Patient Care: Yes (Comment) Care giver support system in place?: No (comment)   Criminal Activity/Legal Involvement Pertinent to Current Situation/Hospitalization: No - Comment as needed  Activities of Daily Living      Permission Sought/Granted Permission sought to share information with : Facility Sport and exercise psychologist, Family Supports Permission granted to share information with : Yes, Verbal Permission Granted  Share Information with NAME: [name]  Permission granted to share info w AGENCY: [facility], SNF  Permission granted to share info w Relationship: Son     Emotional Assessment   Attitude/Demeanor/Rapport: Unable to Assess Affect (typically observed): Unable to Assess Orientation: : Oriented to Self, Oriented to Place Alcohol / Substance Use: Not Applicable Psych Involvement: No (comment)  Admission diagnosis:  Lower urinary tract infectious disease [N39.0] Secondary hypertension [I15.9] Altered mental status, unspecified altered mental status type [B04.88] Acute metabolic encephalopathy [Q91.69] COVID-19 [U07.1] Patient Active Problem List   Diagnosis Date Noted   COVID-19 45/07/8880   Acute metabolic encephalopathy 80/07/4915   Acute cystitis 05/19/2022   COVID 05/19/2022   Hypomagnesemia 03/23/2022  Dizziness and giddiness 03/13/2022   Menopausal problem 01/07/2022   Closed fracture of odontoid process of axis ([place]) 01/07/2022   Dysuria 07/10/2021   Diverticulosis of intestine, part unspecified, without perforation or abscess without bleeding 06/30/2020   Dry eye syndrome of bilateral lacrimal glands 06/30/2020   History of falling 06/30/2020   Homonymous  bilateral field defects, right side 06/30/2020   Muscle weakness (generalized) 06/30/2020   Nondisplaced posterior arch fracture of first cervical vertebra, subsequent encounter for fracture with routine healing 06/30/2020   Other abnormalities of gait and mobility 06/30/2020   Other lack of coordination 06/30/2020   Other specified disorders of bone density and structure, unspecified site 06/30/2020   Insomnia, unspecified 06/30/2020   Personal history of pulmonary embolism 06/30/2020   Sleep apnea, unspecified 06/30/2020   Cervical spine fracture ([place]) 06/21/2020   Hypokalemia 06/21/2020   Fall from ground level 06/21/2020   History of CVA (cerebrovascular accident) 06/09/2019   Epistaxis 05/30/2019   Gastritis and gastroduodenitis    Acute post-traumatic headache, not intractable 12/21/2018   Hot flashes 02/09/2017   Adjustment disorder with mixed anxiety and depressed mood 06/28/2016   Right homonymous hemianopsia 06/26/2016   Back pain 04/08/2015   Abnormal CT scan 04/08/2015   Intracranial aneurysm 10/18/2014   Routine general medical examination at a health care facility 03/08/2014   Occipital neuralgia 07/16/2013   Venous insufficiency 07/11/2013   Alopecia 06/08/2012   Nasal congestion 02/08/2012   Nutritional deficiency, unspecified 02/08/2012   Other allergic rhinitis 02/08/2012   Osteopenia 02/21/2010   ANXIETY 05/21/2009   Essential hypertension 05/21/2009   Permanent atrial fibrillation ([place]) 03/03/2009   Hyperlipidemia 08/15/2007   GERD 08/15/2007   PCP:  Hoyt Koch, MD Pharmacy:   [facility] [place], [place] AT [address] [place] [address] Phone: [phone] Fax: [phone]     Social Determinants of Health (SDOH) Social History: SDOH Screenings   Food Insecurity: No Food Insecurity (03/14/2022)  Housing: Low Risk  (03/14/2022)  Transportation Needs: No  Transportation Needs (03/14/2022)  Utilities: Not At Risk (03/14/2022)  Alcohol Screen: Low Risk  (04/29/2021)  Depression (PHQ2-9): Low Risk  (03/23/2022)  Financial Resource Strain: Low Risk  (04/29/2021)  Physical Activity: Inactive (04/29/2021)  Social Connections: Moderately Integrated (04/29/2021)  Stress: No Stress Concern Present (04/29/2021)  Tobacco Use: Low Risk  (05/30/2022)   SDOH Interventions:     Readmission Risk Interventions     No data to display

## 2022-05-31 NOTE — TOC Transition Note (Signed)
Transition of Care [facility]) - CM/SW Discharge Note   Patient Details  Name: Samantha Bishop MRN: [id number] Date of Birth: [birth year]  [facility]) CM/SW Contact:  Geralynn Ochs, LCSW Phone Number: 05/31/2022, 3:52 PM   Clinical Narrative:   CSW updated by OT and MD that patient is now at baseline, can return to independent living. CSW spoke with son to update on discharge. Transport arranged with [facility] for next available. CSW spoke with [facility], no report needed as patient is in independent living. [facility] prefers to use [facility] for home health, CSW set up [facility] PT and OT. No other needs identified.    Final next level of care: Home w Home Health Services Barriers to Discharge: Barriers Resolved   Patient Goals and CMS Choice CMS Medicare.gov Compare Post Acute Care list provided to:: Patient Represenative (must comment) Choice offered to / list presented to : Adult Children  Discharge Placement                Patient chooses bed at:  ([facility]) Patient to be transferred to facility by: [place] Name of family member notified: Self, [name] Patient and family notified of of transfer: 05/31/22  Discharge Plan and Services Additional resources added to the After Visit Summary for       Post Acute Care Choice: NA                    HH Arranged: PT, OT HH Agency: [facility] Date [facility] Agency Contacted: 05/31/22   Representative spoke with at [place]: [employer] Interventions SDOH Screenings   Food Insecurity: No Food Insecurity (03/14/2022)  Housing: Low Risk  (03/14/2022)  Transportation Needs: No Transportation Needs (03/14/2022)  Utilities: Not At Risk (03/14/2022)  Alcohol Screen: Low Risk  (04/29/2021)  Depression (PHQ2-9): Low Risk  (03/23/2022)  Financial Resource Strain: Low Risk  (04/29/2021)  Physical Activity: Inactive (04/29/2021)  Social Connections: Moderately Integrated (04/29/2021)   Stress: No Stress Concern Present (04/29/2021)  Tobacco Use: Low Risk  (05/30/2022)     Readmission Risk Interventions     No data to display

## 2022-05-31 NOTE — Progress Notes (Signed)
[facility] called and transport to [facility] set up. LCSW & bedside aware.

## 2022-05-31 NOTE — Progress Notes (Addendum)
PROGRESS NOTE    Samantha Bishop  EUM:[id number] DOB: [birth year] DOA: 05/29/2022 PCP: Hoyt Koch, MD      Brief Narrative:   From admission h and p  TYANNA Bishop is a 89 y.o. female with medical history significant for permanent atrial fibrillation on Eliquis, history of CVA, HTN, HLD, anxiety who presented to the ED from [facility] facility for altered mental status.   Patient was seen by her PCP on 05/19/2022 with dysuria and URI symptoms.  She was found to have a UTI and tested positive for COVID.  She was started on 7-day course of Keflex and completed course of Paxlovid.  Urine culture grew Proteus resistant to nitrofurantoin but otherwise sensitive.  Patient held Eliquis until Paxlovid course was completed and has since restarted.   Patient states that she has been isolating due to the [place] outbreak at her facility.  She has just been feeling increasingly lethargic and generally weak.  She reports urinary frequency.  She had reported dysuria earlier.  She says she saw some small spots of blood on toilet paper after bowel movement but no large bloody bowel movements.  She does report a history of hemorrhoids.    Assessment & Plan:   Principal Problem:   Acute metabolic encephalopathy Active Problems:   Permanent atrial fibrillation (HCC)   Essential hypertension   Hyperlipidemia   Adjustment disorder with mixed anxiety and depressed mood   History of CVA (cerebrovascular accident)   COVID   COVID-19  # Delirium Likely 2/2 covid infection. Possible UTI. No hepatic or kidney dysfunction, no significant electrolyte abnormality, not hypoglycemic, no report of seizure-like activity, MRI negative for acute process. B12 is low. Much more alert today. Paranoid today but son-in-law says this is her baseline. She appears to be back to baseline. - treatments as below - delirium precautions - f/u blood cultures - stop fluids - declines snf, toc consulted to  see about returning to ALF  # Low cortisol - check acth stim  # B12 deficiency Possibly symptomatic - will start with 1000 mcg IM daily while here  # Covid-19 Tested positive on 1/10, unclear when symptoms started. Treated with course molnupiravir. No hypoxia or dyspnea to warrant steroids, CXR clear. - monitor symptoms  # UTI? Recently treated for proteus uti with keflex. Urinalysis equivocal for infection. Patient reported frequency and dysuria to admitting provider. Culture growing klebsiella cefazolin sensitive. - ceftriaxone started 1/20, will stop and start keflex plan for 7 days course given recent uti  # Neuropathy - decrease home gabapentin dose from 600 tid to 100 tid while encephalopathic  # HTN Here BPs severely elevated to 200s, now improving to 150s - cont home metoprolol, statin - cont home hctz - hydral prn  # A-fib Rate controlled - cont home metop and apixaban  # Hx CVA Old infarcts seen on MRI, nothing acute - cont home apixaban, statin  # Presbycusis Noted  # GAD - cont home excitalopram 5  # Hematochezia Hx hemorrhoids, per admitting provider report of few drops of red blood. Hgb wnl - monitor for now   DVT prophylaxis: apixaban Code Status: full  Family Communication: son-in-law updated telephonically 1/22  Level of care: Med-Surg Status is: inpt    Consultants:  none  Procedures: none  Antimicrobials:  ceftriaxone > keflex   Subjective: Feeling well  Objective: Vitals:   05/30/22 1400 05/30/22 2036 05/31/22 0414 05/31/22 0731  BP: (!) 102/48 119/68 (!) 148/57 (!) 155/86  Pulse: (!) 57 82 (!) 50 92  Resp: (!) '23 16 16 16  '$ Temp:  97.9 F (36.6 C) 97.9 F (36.6 C) 98 F (36.7 C)  TempSrc:  Oral  Oral  SpO2: 93% 98% 96% 98%  Weight:      Height:        Intake/Output Summary (Last 24 hours) at 05/31/2022 1431 Last data filed at 05/31/2022 0900 Gross per 24 hour  Intake 2196.88 ml  Output --  Net 2196.88 ml    Filed Weights   05/30/22 0736  Weight: 78.9 kg    Examination:  General exam: mild confusion Respiratory system: Clear to auscultation. Respiratory effort normal. Cardiovascular system: S1 & S2 heard, irreg irreg, soft systolic murmur Gastrointestinal system: Abdomen is obese, soft and nontender. No organomegaly or masses felt. Normal bowel sounds heard. Central nervous system: awake, moving all 4, hard of hearing Extremities: Symmetric 5 x 5 power. Skin: No rashes, lesions or ulcers Psychiatry: calm    Data Reviewed: I have personally reviewed following labs and imaging studies  CBC: Recent Labs  Lab 05/29/22 1625 05/29/22 1736 05/30/22 0608  WBC 8.6  --  12.0*  NEUTROABS 5.8  --   --   HGB 12.1 12.2 12.6  HCT 36.8 36.0 37.1  MCV 88.0  --  86.1  PLT 252  --  983   Basic Metabolic Panel: Recent Labs  Lab 05/29/22 1625 05/29/22 1736 05/30/22 0608 05/30/22 0900  NA 138 141 133*  --   K 3.6 3.4* 3.3*  --   CL 103  --  101  --   CO2 23  --  21*  --   GLUCOSE 108*  --  131*  --   BUN 8  --  7*  --   CREATININE 0.58  --  0.62  --   CALCIUM 9.8  --  9.2  --   MG  --   --   --  1.8   GFR: Estimated Creatinine Clearance: 45.5 mL/min (by C-G formula based on SCr of 0.62 mg/dL). Liver Function Tests: Recent Labs  Lab 05/29/22 1625  AST 21  ALT 12  ALKPHOS 48  BILITOT 0.2*  PROT 7.8  ALBUMIN 4.1   No results for input(s): "LIPASE", "AMYLASE" in the last 168 hours. Recent Labs  Lab 05/29/22 1625  AMMONIA 19   Coagulation Profile: No results for input(s): "INR", "PROTIME" in the last 168 hours. Cardiac Enzymes: No results for input(s): "CKTOTAL", "CKMB", "CKMBINDEX", "TROPONINI" in the last 168 hours. BNP (last 3 results) No results for input(s): "PROBNP" in the last 8760 hours. HbA1C: No results for input(s): "HGBA1C" in the last 72 hours. CBG: Recent Labs  Lab 05/29/22 1721 05/30/22 0752  GLUCAP 100* 150*   Lipid Profile: No results for  input(s): "CHOL", "HDL", "LDLCALC", "TRIG", "CHOLHDL", "LDLDIRECT" in the last 72 hours. Thyroid Function Tests: Recent Labs    05/30/22 0908  TSH 1.594   Anemia Panel: Recent Labs    05/30/22 0908  VITAMINB12 187   Urine analysis:    Component Value Date/Time   COLORURINE YELLOW 05/29/2022 1543   APPEARANCEUR HAZY (A) 05/29/2022 1543   LABSPEC 1.006 05/29/2022 1543   PHURINE 7.0 05/29/2022 1543   GLUCOSEU NEGATIVE 05/29/2022 1543   GLUCOSEU NEGATIVE 07/10/2021 1617   HGBUR SMALL (A) 05/29/2022 1543   BILIRUBINUR NEGATIVE 05/29/2022 1543   BILIRUBINUR negative 05/19/2022 [place] 05/29/2022 1543   PROTEINUR NEGATIVE 05/29/2022 1543   UROBILINOGEN  0.2 05/19/2022 1507   UROBILINOGEN 0.2 07/10/2021 1617   NITRITE NEGATIVE 05/29/2022 1543   LEUKOCYTESUR LARGE (A) 05/29/2022 1543   Sepsis Labs: '@LABRCNTIP'$ (procalcitonin:4,lacticidven:4)  ) Recent Results (from the past 240 hour(s))  Blood Culture (Routine X 2)     Status: None (Preliminary result)   Collection Time: 05/29/22  4:25 PM   Specimen: BLOOD LEFT FOREARM  Result Value Ref Range Status   Specimen Description BLOOD LEFT FOREARM  Final   Special Requests   Final    BOTTLES DRAWN AEROBIC AND ANAEROBIC Blood Culture adequate volume   Culture   Final    NO GROWTH 2 DAYS Performed at [facility], [place] [address], [place], [place] [address]    Report Status PENDING  Incomplete  Urine Culture     Status: Abnormal   Collection Time: 05/29/22  4:33 PM   Specimen: Urine, Clean Catch  Result Value Ref Range Status   Specimen Description URINE, CLEAN CATCH  Final   Special Requests   Final    NONE Performed at [facility], [place] [address], [place], [place] [address]    Culture >=100,000 COLONIES/mL KLEBSIELLA PNEUMONIAE (A)  Final   Report Status 05/31/2022 FINAL  Final   Organism ID, Bacteria KLEBSIELLA PNEUMONIAE (A)  Final      Susceptibility   Klebsiella pneumoniae - MIC*     AMPICILLIN >=32 RESISTANT Resistant     CEFAZOLIN <=4 SENSITIVE Sensitive     CEFEPIME <=0.12 SENSITIVE Sensitive     CEFTRIAXONE <=0.25 SENSITIVE Sensitive     CIPROFLOXACIN <=0.25 SENSITIVE Sensitive     GENTAMICIN <=1 SENSITIVE Sensitive     IMIPENEM <=0.25 SENSITIVE Sensitive     NITROFURANTOIN 128 RESISTANT Resistant     TRIMETH/SULFA <=20 SENSITIVE Sensitive     AMPICILLIN/SULBACTAM 4 SENSITIVE Sensitive     PIP/TAZO <=4 SENSITIVE Sensitive     * >=100,000 COLONIES/mL KLEBSIELLA PNEUMONIAE  Blood Culture (Routine X 2)     Status: None (Preliminary result)   Collection Time: 05/29/22  5:27 PM   Specimen: BLOOD  Result Value Ref Range Status   Specimen Description BLOOD SITE NOT SPECIFIED  Final   Special Requests   Final    BOTTLES DRAWN AEROBIC AND ANAEROBIC Blood Culture adequate volume   Culture   Final    NO GROWTH 2 DAYS Performed at [facility], [address], [place],  [address]    Report Status PENDING  Incomplete  Resp panel by RT-PCR (RSV, Flu A&B, Covid) Anterior Nasal Swab     Status: Abnormal   Collection Time: 05/29/22  9:35 PM   Specimen: Anterior Nasal Swab  Result Value Ref Range Status   SARS Coronavirus 2 by RT PCR POSITIVE (A) NEGATIVE Final    Comment: (NOTE) SARS-CoV-2 target nucleic acids are DETECTED.  The SARS-CoV-2 RNA is generally detectable in upper respiratory specimens during the acute phase of infection. Positive results are indicative of the presence of the identified virus, but do not rule out bacterial infection or co-infection with other pathogens not detected by the test. Clinical correlation with patient history and other diagnostic information is necessary to determine patient infection status. The expected result is Negative.  Fact Sheet for Patients: EntrepreneurPulse.com.au  Fact Sheet for Healthcare Providers: IncredibleEmployment.be  This test is not yet approved or cleared  by the [place] FDA and  has been authorized for detection and/or diagnosis of SARS-CoV-2 by FDA under an Emergency Use Authorization (  EUA).  This EUA will remain in effect (meaning this test can be used) for the duration of  the COVID-19 declaration under Section 564(b)(1) of the A ct, 21 U.S.C. section 360bbb-3(b)(1), unless the authorization is terminated or revoked sooner.     Influenza A by PCR NEGATIVE NEGATIVE Final   Influenza B by PCR NEGATIVE NEGATIVE Final    Comment: (NOTE) The Xpert Xpress SARS-CoV-2/FLU/RSV plus assay is intended as an aid in the diagnosis of influenza from Nasopharyngeal swab specimens and should not be used as a sole basis for treatment. Nasal washings and aspirates are unacceptable for Xpert Xpress SARS-CoV-2/FLU/RSV testing.  Fact Sheet for Patients: EntrepreneurPulse.com.au  Fact Sheet for Healthcare Providers: IncredibleEmployment.be  This test is not yet approved or cleared by the [place] FDA and has been authorized for detection and/or diagnosis of SARS-CoV-2 by FDA under an Emergency Use Authorization (EUA). This EUA will remain in effect (meaning this test can be used) for the duration of the COVID-19 declaration under Section 564(b)(1) of the Act, 21 U.S.C. section 360bbb-3(b)(1), unless the authorization is terminated or revoked.     Resp Syncytial Virus by PCR NEGATIVE NEGATIVE Final    Comment: (NOTE) Fact Sheet for Patients: EntrepreneurPulse.com.au  Fact Sheet for Healthcare Providers: IncredibleEmployment.be  This test is not yet approved or cleared by the [place] FDA and has been authorized for detection and/or diagnosis of SARS-CoV-2 by FDA under an Emergency Use Authorization (EUA). This EUA will remain in effect (meaning this test can be used) for the duration of the COVID-19 declaration under Section 564(b)(1) of the Act, 21  U.S.C. section 360bbb-3(b)(1), unless the authorization is terminated or revoked.  Performed at [facility], [place] [address], [place], [place] [address]          Radiology Studies: MR BRAIN WO CONTRAST  Result Date: 05/30/2022 CLINICAL DATA:  Altered mental status EXAM: MRI HEAD WITHOUT CONTRAST TECHNIQUE: Multiplanar, multiecho pulse sequences of the brain and surrounding structures were obtained without intravenous contrast. COMPARISON:  None Available. FINDINGS: Brain: No acute infarction, hemorrhage, hydrocephalus, extra-axial collection or mass lesion. Multifocal chronic microhemorrhage (2-3 foci) in a mixed central and peripheral distribution. There is confluent hyperintense T2-weighted signal within the periventricular and deep white matter. 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 Generalized volume loss. Normal midline structures. Vascular: Normal flow voids. Skull and upper cervical spine: Normal marrow signal. Sinuses/Orbits: Left maxillary sinus retention cyst. Normal orbits. Other: None. IMPRESSION: 1. No acute intracranial abnormality. 2. 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 3. Advanced chronic microvascular ischemic changes and volume loss. Electronically Signed   By: Ulyses Jarred M.D.   On: 05/30/2022 03:20   CT HEAD WO CONTRAST  Result Date: 05/29/2022 CLINICAL DATA:  Mental status changes. Confusion. EXAM: CT HEAD WITHOUT CONTRAST TECHNIQUE: Contiguous axial images were obtained from the base of the skull through the vertex without intravenous contrast. RADIATION DOSE REDUCTION: This exam was performed according to the departmental dose-optimization program which includes automated exposure control, adjustment of the mA and/or kV according to patient size and/or use of iterative reconstruction technique. COMPARISON:  03/23/2021 FINDINGS: Brain: There is no evidence for acute hemorrhage,  hydrocephalus, mass lesion, or abnormal extra-axial fluid collection. No definite CT evidence for acute infarction. Similar appearance of right parietal and left PCA territory infarcts. Diffuse loss of parenchymal volume is consistent with atrophy. Patchy low attenuation in the deep hemispheric  and periventricular white matter is nonspecific, but likely reflects chronic microvascular ischemic demyelination. Vascular: No hyperdense vessel or unexpected calcification. Skull: No evidence for fracture. No worrisome lytic or sclerotic lesion. Sinuses/Orbits: Chronic polypoid mucosal thickening in the left maxillary sinus is unchanged. Remaining visualized paranasal sinuses and mastoid air cells are clear. Visualized portions of the globes and intraorbital fat are unremarkable. Other: None. IMPRESSION: 1. No acute intracranial abnormality. 2. Old right parietal and left PCA territory infarcts. 3. Atrophy with chronic small vessel ischemic disease. Electronically Signed   By: Misty Stanley M.D.   On: 05/29/2022 18:13   DG Chest Port 1 View  Result Date: 05/29/2022 CLINICAL DATA:  Altered mental status. EXAM: PORTABLE CHEST 1 VIEW COMPARISON:  June 21, 2020 FINDINGS: The cardiac silhouette is markedly enlarged and unchanged in size. Mild, diffuse, chronic appearing increased lung markings are noted. There is no evidence of an acute infiltrate, pleural effusion or pneumothorax. The visualized skeletal structures are unremarkable. IMPRESSION: Marked severity cardiomegaly without evidence of acute or active cardiopulmonary disease. Electronically Signed   By: Virgina Norfolk M.D.   On: 05/29/2022 16:21        Scheduled Meds:  apixaban  5 mg Oral BID   escitalopram  5 mg Oral Daily   gabapentin  100 mg Oral TID   hydrochlorothiazide  12.5 mg Oral Daily   metoprolol tartrate  25 mg Oral BID   pravastatin  20 mg Oral QPM   sodium chloride flush  3 mL Intravenous Q12H   Continuous Infusions:  0.9 % NaCl  with KCl 20 mEq / L 100 mL/hr at 05/31/22 0900     LOS: 1 day     Desma Maxim, MD [facility]   If 7PM-7AM, please contact night-coverage [email or web address] Password [facility] 05/31/2022, 2:31 PM

## 2022-06-01 ENCOUNTER — Encounter: Payer: Self-pay | Admitting: *Deleted

## 2022-06-01 ENCOUNTER — Telehealth: Payer: Self-pay | Admitting: *Deleted

## 2022-06-01 NOTE — Patient Outreach (Signed)
Care Coordination [facility] Note Transition Care Management Follow-up Telephone Call Date of discharge and from where: Monday. 05/31/22[facility]; AMS/ metabolic encephalopathy, secondary to presumable UTI/ post-COVID infection How have you been since you were released from the hospital? "I am doing okay.... I don't have any new medications.  The people here at [facility] are taking care of me.  You should call my son;"  I then contacted patient's son on [facility] DPR: "I don't live in [place], I don't know what her discharge instructions are; the people at [facility] look out for her, I think you should contact them; she is very hard of hearing and probably didn't hear anything you said because she doesn't wear her hearing aids, and she does not like talking to anyone on the phone that she does not know;" I then reached out to [place] and spoke with "Langley Gauss;"  "She seems fine.  She told me this morning she was taking her antibiotics.  I will call the doctor for her to schedule her hospital follow up appointment, because I do all the transportation scheduling" Any questions or concerns? No  Items Reviewed: Did the pt receive and understand the discharge instructions provided?  Unable to determine, as above Medications obtained and verified? No - "Denise" at [facility] states patient did obtain and is taking post-hospital discharge antibiotic/ vitamin B-12; reports patient otherwise handles all of her own medications Other? No  Any new allergies since your discharge?  Unable to determine Dietary orders reviewed? No Do you have support at home? Yes - patient resides at [place] at [facility]; she/ her son/ "Langley Gauss" at [place] confirm that patient has assistance with care needs as needed at [facility] and Equipment/Supplies: Were home health services ordered? Unable to determine If so, what is the name of the agency? N/A  Has the agency set up a time to come to the patient's home? Unable to determine Were any new  equipment or medical supplies ordered?  No What is the name of the medical supply agency? N/A Were you able to get the supplies/equipment? not applicable Do you have any questions related to the use of the equipment or supplies? No N/A  Functional Questionnaire: (I = Independent and D = Dependent) ADLs: I  patient has assistance with care needs as needed at ILF  Bathing/Dressing- I  Meal Prep- D  patient has assistance with care needs as needed at [place]- I  Maintaining continence- I  Transferring/Ambulation- I  Managing Meds- I  Follow up appointments reviewed:  PCP Hospital f/u appt confirmed? No  Scheduled to see - on - @ - "Langley Gauss" at [facility] states she will contact PCP to facilitate scheduling hospital follow up visit, as patient uses transportation services at [facility] f/u appt confirmed? No  Scheduled to see - on - @ - Are transportation arrangements needed? No  If their condition worsens, is the pt aware to call PCP or go to the Emergency Dept.? Yes Was the patient provided with contact information for the PCP's office or ED? Yes- provided "Langley Gauss" at [facility] with PCP office number Was to pt encouraged to call back with questions or concerns? Yes  SDOH assessments and interventions completed:   Yes SDOH Interventions Today    Flowsheet Row Most Recent Value  SDOH Interventions   Food Insecurity Interventions Intervention Not Indicated  [Patient reports she resides at [facility] and all meals are prepared]  Transportation Interventions Intervention Not Indicated  [  patient reports ILF provides transportation]      Care Coordination Interventions:  Completed multiple outreaches to family/ ILF staff to facilitate scheduling hospital follow up appointment; to confirm patient obtained post-hospital discharge medications    Encounter Outcome:  Pt. Visit Completed    Oneta Rack, RN, BSN, CCRN Alumnus RN CM Care Coordination/  Transition of [place] Management [phone]: direct office

## 2022-06-03 LAB — CULTURE, BLOOD (ROUTINE X 2)
Culture: NO GROWTH
Culture: NO GROWTH
Special Requests: ADEQUATE
Special Requests: ADEQUATE

## 2022-06-15 ENCOUNTER — Inpatient Hospital Stay: Payer: Medicare Other | Admitting: Internal Medicine

## 2022-06-21 ENCOUNTER — Ambulatory Visit: Payer: Medicare Other | Admitting: Internal Medicine

## 2022-06-22 ENCOUNTER — Ambulatory Visit (INDEPENDENT_AMBULATORY_CARE_PROVIDER_SITE_OTHER): Payer: Medicare Other | Admitting: Internal Medicine

## 2022-06-22 ENCOUNTER — Encounter: Payer: Self-pay | Admitting: Internal Medicine

## 2022-06-22 VITALS — BP 140/80 | HR 68 | Temp 98.4°F | Ht 67.0 in | Wt 175.0 lb

## 2022-06-22 DIAGNOSIS — M6281 Muscle weakness (generalized): Secondary | ICD-10-CM | POA: Diagnosis not present

## 2022-06-22 DIAGNOSIS — N3001 Acute cystitis with hematuria: Secondary | ICD-10-CM | POA: Diagnosis not present

## 2022-06-22 NOTE — Patient Instructions (Signed)
We do not need any labs today.   

## 2022-06-22 NOTE — Progress Notes (Signed)
   Subjective:   Patient ID: Samantha Bishop, female    DOB: Mar 15, 1925, 89 y.o.   MRN: [id number]  HPI The patient is a 89 YO female coming in for hospital follow up (in with AMS due to UTI and covid-19, treated with antiviral and antibiotics). Improved now and appetite good. No cough and no SOB.   PMH, [facility], social history reviewed and updated  Review of Systems  Constitutional:  Positive for fatigue.  HENT: Negative.    Eyes: Negative.   Respiratory:  Negative for cough, chest tightness and shortness of breath.   Cardiovascular:  Negative for chest pain, palpitations and leg swelling.  Gastrointestinal:  Negative for abdominal distention, abdominal pain, constipation, diarrhea, nausea and vomiting.  Musculoskeletal: Negative.   Skin: Negative.   Neurological: Negative.   Psychiatric/Behavioral: Negative.      Objective:  Physical Exam Constitutional:      Appearance: She is well-developed.  HENT:     Head: Normocephalic and atraumatic.  Cardiovascular:     Rate and Rhythm: Normal rate and regular rhythm.  Pulmonary:     Effort: Pulmonary effort is normal. No respiratory distress.     Breath sounds: Normal breath sounds. No wheezing or rales.  Abdominal:     General: Bowel sounds are normal. There is no distension.     Palpations: Abdomen is soft.     Tenderness: There is no abdominal tenderness. There is no rebound.  Musculoskeletal:     Cervical back: Normal range of motion.  Skin:    General: Skin is warm and dry.  Neurological:     Mental Status: She is alert and oriented to person, place, and time.     Coordination: Coordination abnormal.     Comments: Walker for walking     Vitals:   06/22/22 1403 06/22/22 1407  BP: (!) 140/80 (!) 140/80  Pulse: 68   Temp: 98.4 F (36.9 C)   TempSrc: Oral   SpO2: 98%   Weight: 175 lb (79.4 kg)   Height: '5\' 7"'$  (1.702 m)     Assessment & Plan:  Visit time 20 minutes in face to face communication with patient and  coordination of care, additional 10 minutes spent in record review, coordination or care, ordering tests, communicating/referring to other healthcare professionals, documenting in medical records all on the same day of the visit for total time 30 minutes spent on the visit.

## 2022-06-22 NOTE — Assessment & Plan Note (Signed)
Doing well with walker at home. She did decline home PT/OT after discharge.

## 2022-06-22 NOTE — Assessment & Plan Note (Signed)
Appears to be resolved. Will not recheck urine at this time and no labs needed today.

## 2022-07-15 ENCOUNTER — Telehealth: Payer: Self-pay | Admitting: Internal Medicine

## 2022-07-15 NOTE — Telephone Encounter (Signed)
Patient called stating she needs a letter for her attorney stating that she is in good health and in a good mental capacity. Her last appointment was 06/22/22, she would like a callback at [phone] to discuss due to having to set up transportation because she does not drive.

## 2022-07-16 ENCOUNTER — Emergency Department (HOSPITAL_COMMUNITY)
Admission: EM | Admit: 2022-07-16 | Discharge: 2022-07-16 | Disposition: A | Discharge status: Home / Self Care | Payer: Medicare Other | Attending: Emergency Medicine | Admitting: Emergency Medicine

## 2022-07-16 ENCOUNTER — Emergency Department (HOSPITAL_COMMUNITY): Payer: Medicare Other

## 2022-07-16 ENCOUNTER — Encounter (HOSPITAL_COMMUNITY): Payer: Self-pay

## 2022-07-16 ENCOUNTER — Other Ambulatory Visit: Payer: Self-pay

## 2022-07-16 DIAGNOSIS — R002 Palpitations: Secondary | ICD-10-CM | POA: Diagnosis not present

## 2022-07-16 DIAGNOSIS — Z7901 Long term (current) use of anticoagulants: Secondary | ICD-10-CM | POA: Insufficient documentation

## 2022-07-16 DIAGNOSIS — Z79899 Other long term (current) drug therapy: Secondary | ICD-10-CM | POA: Insufficient documentation

## 2022-07-16 DIAGNOSIS — R531 Weakness: Secondary | ICD-10-CM | POA: Diagnosis not present

## 2022-07-16 DIAGNOSIS — E876 Hypokalemia: Secondary | ICD-10-CM | POA: Diagnosis not present

## 2022-07-16 DIAGNOSIS — R110 Nausea: Secondary | ICD-10-CM | POA: Diagnosis not present

## 2022-07-16 DIAGNOSIS — I10 Essential (primary) hypertension: Secondary | ICD-10-CM | POA: Diagnosis not present

## 2022-07-16 DIAGNOSIS — R5383 Other fatigue: Secondary | ICD-10-CM | POA: Diagnosis not present

## 2022-07-16 DIAGNOSIS — Z743 Need for continuous supervision: Secondary | ICD-10-CM | POA: Diagnosis not present

## 2022-07-16 DIAGNOSIS — R6889 Other general symptoms and signs: Secondary | ICD-10-CM | POA: Diagnosis not present

## 2022-07-16 DIAGNOSIS — Z7401 Bed confinement status: Secondary | ICD-10-CM | POA: Diagnosis not present

## 2022-07-16 LAB — HEPATIC FUNCTION PANEL
ALT: 13 U/L (ref 0–44)
AST: 18 U/L (ref 15–41)
Albumin: 4.2 g/dL (ref 3.5–5.0)
Alkaline Phosphatase: 41 U/L (ref 38–126)
Bilirubin, Direct: 0.1 mg/dL (ref 0.0–0.2)
Indirect Bilirubin: 0.4 mg/dL (ref 0.3–0.9)
Total Bilirubin: 0.5 mg/dL (ref 0.3–1.2)
Total Protein: 7.5 g/dL (ref 6.5–8.1)

## 2022-07-16 LAB — URINALYSIS, ROUTINE W REFLEX MICROSCOPIC
Bilirubin Urine: NEGATIVE
Glucose, UA: NEGATIVE mg/dL
Ketones, ur: NEGATIVE mg/dL
Leukocytes,Ua: NEGATIVE
Nitrite: NEGATIVE
Protein, ur: NEGATIVE mg/dL
Specific Gravity, Urine: 1.005 (ref 1.005–1.030)
pH: 8 (ref 5.0–8.0)

## 2022-07-16 LAB — BASIC METABOLIC PANEL
Anion gap: 10 (ref 5–15)
BUN: 12 mg/dL (ref 8–23)
CO2: 25 mmol/L (ref 22–32)
Calcium: 9.3 mg/dL (ref 8.9–10.3)
Chloride: 102 mmol/L (ref 98–111)
Creatinine, Ser: 0.62 mg/dL (ref 0.44–1.00)
GFR, Estimated: >60 mL/min (ref >60)
Glucose, Bld: 119 mg/dL — ABNORMAL HIGH (ref 70–99)
Potassium: 3.4 mmol/L — ABNORMAL LOW (ref 3.5–5.1)
Sodium: 137 mmol/L (ref 135–145)

## 2022-07-16 LAB — CBC
HCT: 36.3 % (ref 36.0–46.0)
Hemoglobin: 11.5 g/dL — ABNORMAL LOW (ref 12.0–15.0)
MCH: 28.3 pg (ref 26.0–34.0)
MCHC: 31.7 g/dL (ref 30.0–36.0)
MCV: 89.2 fL (ref 80.0–100.0)
Platelets: 256 10*3/uL (ref 150–400)
RBC: 4.07 MIL/uL (ref 3.87–5.11)
RDW: 13.9 % (ref 11.5–15.5)
WBC: 6.1 10*3/uL (ref 4.0–10.5)
nRBC: 0 % (ref 0.0–0.2)

## 2022-07-16 LAB — TSH: TSH: 1.22 u[IU]/mL (ref 0.350–4.500)

## 2022-07-16 LAB — TROPONIN I (HIGH SENSITIVITY)
Troponin I (High Sensitivity): 9 ng/L (ref <18)
Troponin I (High Sensitivity): 9 ng/L (ref <18)

## 2022-07-16 LAB — PROTIME-INR
INR: 1.2 (ref 0.8–1.2)
Prothrombin Time: 15.5 seconds — ABNORMAL HIGH (ref 11.4–15.2)

## 2022-07-16 LAB — MAGNESIUM: Magnesium: 2.3 mg/dL (ref 1.7–2.4)

## 2022-07-16 MED ORDER — ACETAMINOPHEN 325 MG PO TABS
650.0000 mg | ORAL_TABLET | Freq: Once | ORAL | Status: AC
  Administered 2022-07-16: 650 mg via ORAL
  Filled 2022-07-16: qty 2

## 2022-07-16 NOTE — ED Notes (Signed)
ED Provider at bedside. 

## 2022-07-16 NOTE — Discharge Instructions (Signed)
Your history, exam, and workup today did not show any critical abnormalities.  I suspect that you are having palpitations related to your chronic atrial fibrillation that may speed up and slow down at times.  For over 4 and half hours you were here without significant changes on our monitor and your labs were overall similar to prior and reassuring.  We feel you are safe to discharge home we do recommend following up with your PCP and cardiologist.  Please rest and stay hydrated.  If any symptoms change or worsen acutely, please return to the nearest emergency department.

## 2022-07-16 NOTE — ED Notes (Signed)
[facility] notified for transport back to [facility].

## 2022-07-16 NOTE — ED Triage Notes (Signed)
Heart palpitations and weakness started last night. Hx afib. Today woke up with nausea after taking her morning medications. Coming from [facility]

## 2022-07-16 NOTE — ED Provider Notes (Signed)
[place] EMERGENCY DEPARTMENT AT [facility] Provider Note   CSN: [id number] Arrival date & time: 07/16/22  [id number]     History  Chief Complaint  Patient presents with   Palpitations   Nausea    Samantha Bishop is a 89 y.o. female.  The history is provided by the patient and medical records. The history is limited by the condition of the patient. No language interpreter was used.  Palpitations Palpitations quality:  Irregular Onset quality:  Gradual Timing:  Sporadic Progression:  Waxing and waning Chronicity:  New Relieved by:  Nothing Worsened by:  Nothing Ineffective treatments:  None tried Associated symptoms: malaise/fatigue and nausea (per EMS report but denied by patient)   Associated symptoms: no back pain, no chest pain, no chest pressure, no cough, no dizziness, no lower extremity edema, no near-syncope, no shortness of breath, no syncope, no vomiting and no weakness   Risk factors: hx of atrial fibrillation, hx of DVT and hx of PE        Home Medications Prior to Admission medications   Medication Sig Start Date End Date Taking? Authorizing Provider  apixaban (ELIQUIS) 5 MG TABS tablet TAKE 1 TABLET (5 MG TOTAL) BY MOUTH 2 (TWO) TIMES DAILY. Patient taking differently: Take 5 mg by mouth See admin instructions. TAKE 1 TABLET (5 MG TOTAL) BY MOUTH 2 (TWO) TIMES DAILY. 03/08/22   Hoyt Koch, MD  cyanocobalamin (VITAMIN B12) 1000 MCG tablet Take 1 tablet (1,000 mcg total) by mouth daily. 05/31/22   Wouk, Ailene Rud, MD  escitalopram (LEXAPRO) 5 MG tablet Take 1 tablet (5 mg total) by mouth daily. 03/10/22   Hoyt Koch, MD  gabapentin (NEURONTIN) 300 MG capsule Take 2 capsules (600 mg total) by mouth 3 (three) times daily. 03/08/22   Hoyt Koch, MD  hydrochlorothiazide (MICROZIDE) 12.5 MG capsule TAKE 1 CAPSULE(12.5 MG) BY MOUTH DAILY Patient taking differently: Take 12.5 mg by mouth daily. 03/08/22   Hoyt Koch,  MD  metoprolol tartrate (LOPRESSOR) 25 MG tablet Take 1 tablet (25 mg total) by mouth 2 (two) times daily. 03/15/22   Regalado, Belkys A, MD  Multiple Vitamin (MULTIVITAMIN WITH MINERALS) TABS tablet Take 1 tablet by mouth daily.    [provider]  pravastatin (PRAVACHOL) 20 MG tablet TAKE 1 TABLET(20 MG) BY MOUTH DAILY AT 6 PM Patient taking differently: Take 20 mg by mouth every evening. 03/08/22   Hoyt Koch, MD  sodium chloride (OCEAN) 0.65 % SOLN nasal spray Place 1 spray into both nostrils as needed for congestion. Patient taking differently: Place 1 spray into both nostrils 2 (two) times daily as needed for congestion. 07/24/19   Hoyt Koch, MD      Allergies    Codeine and Penicillins    Review of Systems   Review of Systems  Constitutional:  Positive for fatigue (denies now) and malaise/fatigue. Negative for chills and fever.  HENT:  Negative for congestion.   Eyes:  Negative for visual disturbance.  Respiratory:  Negative for cough, chest tightness, shortness of breath and wheezing.   Cardiovascular:  Positive for palpitations. Negative for chest pain, syncope and near-syncope.  Gastrointestinal:  Positive for nausea (per EMS report but denied by patient). Negative for abdominal pain and vomiting.  Genitourinary:  Negative for dysuria.  Musculoskeletal:  Negative for back pain, neck pain and neck stiffness.  Skin:  Negative for rash and wound.  Neurological:  Negative for dizziness, weakness, light-headedness and headaches (  chronic but denies now).  Psychiatric/Behavioral:  Negative for agitation and confusion.   All other systems reviewed and are negative.   Physical Exam Updated Vital Signs BP (!) 169/104 (BP Location: Left Arm)   Pulse 77   Temp 98.1 F (36.7 C) (Oral)   Resp 18   Ht '5\' 7"'$  (1.702 m)   Wt 79 kg   SpO2 97%   BMI 27.28 kg/m  Physical Exam Vitals and nursing note reviewed.  Constitutional:      General: She is not in  acute distress.    Appearance: She is well-developed. She is not ill-appearing, toxic-appearing or diaphoretic.  HENT:     Head: Normocephalic and atraumatic.     Right Ear: External ear normal.     Left Ear: External ear normal.     Nose: Nose normal. No congestion or rhinorrhea.     Mouth/Throat:     Mouth: Mucous membranes are moist.     Pharynx: No oropharyngeal exudate or posterior oropharyngeal erythema.  Eyes:     Extraocular Movements: Extraocular movements intact.     Conjunctiva/sclera: Conjunctivae normal.     Pupils: Pupils are equal, round, and reactive to light.  Cardiovascular:     Rate and Rhythm: Normal rate. Rhythm irregular.     Pulses: Normal pulses.     Heart sounds: No murmur heard. Pulmonary:     Effort: Pulmonary effort is normal. No respiratory distress.     Breath sounds: No stridor. No wheezing, rhonchi or rales.  Chest:     Chest wall: No tenderness.  Abdominal:     General: Abdomen is flat. There is no distension.     Tenderness: There is no abdominal tenderness. There is no right CVA tenderness, left CVA tenderness, guarding or rebound.  Musculoskeletal:        General: No tenderness.     Cervical back: Normal range of motion and neck supple. No tenderness.     Right lower leg: No edema.     Left lower leg: No edema.  Skin:    General: Skin is warm.     Coloration: Skin is not pale.     Findings: No erythema or rash.  Neurological:     General: No focal deficit present.     Mental Status: She is alert and oriented to person, place, and time.     Sensory: No sensory deficit.     Motor: No weakness or abnormal muscle tone.     Deep Tendon Reflexes: Reflexes are normal and symmetric.  Psychiatric:        Mood and Affect: Mood normal.     ED Results / Procedures / Treatments   Labs (all labs ordered are listed, but only abnormal results are displayed) Labs Reviewed  BASIC METABOLIC PANEL - Abnormal; Notable for the following components:       Result Value   Potassium 3.4 (*)    Glucose, Bld 119 (*)    All other components within normal limits  CBC - Abnormal; Notable for the following components:   Hemoglobin 11.5 (*)    All other components within normal limits  PROTIME-INR - Abnormal; Notable for the following components:   Prothrombin Time 15.5 (*)    All other components within normal limits  URINALYSIS, ROUTINE W REFLEX MICROSCOPIC - Abnormal; Notable for the following components:   Color, Urine STRAW (*)    Hgb urine dipstick SMALL (*)    Bacteria, UA RARE (*)  All other components within normal limits  TSH  MAGNESIUM  HEPATIC FUNCTION PANEL  TROPONIN I (HIGH SENSITIVITY)  TROPONIN I (HIGH SENSITIVITY)    EKG EKG Interpretation  Date/Time:  Friday July 16 2022 10:00:02 EST Ventricular Rate:  73 PR Interval:    QRS Duration: 92 QT Interval:  418 QTC Calculation: 461 R Axis:   55 Text Interpretation: Atrial fibrillation when compared to prior, similar afib. No STEMI Confirmed by Antony Blackbird [phone]) on 07/16/2022 10:10:04 AM  Radiology DG Chest Portable 1 View  Result Date: 07/16/2022 CLINICAL DATA:  Fatigue and palpitations. EXAM: PORTABLE CHEST 1 VIEW COMPARISON:  05/29/2022 FINDINGS: Cardiomegaly again noted. There is no evidence of focal airspace disease, pulmonary edema, suspicious pulmonary nodule/mass, pleural effusion, or pneumothorax. No acute bony abnormalities are identified. IMPRESSION: No active disease. Electronically Signed   By: Margarette Canada M.D.   On: 07/16/2022 10:41    Procedures Procedures    Medications Ordered in ED Medications - No data to display  ED Course/ Medical Decision Making/ A&P                             Medical Decision Making Amount and/or Complexity of Data Reviewed Labs: ordered. Radiology: ordered.    NATALIYAH LASZEWSKI is a 89 y.o. female With a past medical history significant for permanent atrial fibrillation on Eliquis therapy, previous pulmonary  emboli, hypertension, hyperlipidemia, anxiety, osteoarthritis, sleep apnea, and admission 2 months ago for acute delirium in the setting of possible UTI and COVID who presents with fatigue and palpitations.  According to documentation from EMS, patient had some nausea after taking morning medicines and had some fatigue and palpitations.  Patient says she has had palpitations ever since she left the hospital recently.  She said that over the last 2 days it has worsened and this morning felt like it was very intense.  She is not feeling it now.  On arrival EKG appears to show persistent A-fib with rate in the 80s.  Does not show RVR at this time.  Patient is otherwise denying any chest pain or shortness of breath and denies any lightheadedness or syncope.  She for me initially denied fatigue but did tell EMS reportedly that she was weak and fatigued.  She denies any dysuria or hematuria.  Denies any headache or neck pain.  Denies other complaints on arrival.  Denies URI symptoms.  On my exam, lungs clear.  Chest nontender.  I did not appreciate a murmur initially.  Abdomen nontender.  Patient moving all extremities.  Legs are nontender and nonedematous.  Patient well-appearing.  Patient is very concerned about her previous admission to the hospital and seems to be perseverating on that.  She otherwise is denying any acute trauma is denying any palpitations at this time.  Patient otherwise just is concerned about the palpitations that on and off.  EKG did not show STEMI.  Given the patient's intermittent palpitations I do suspect she could be in and out of RVR if this is what she is feeling.  Will keep her on the monitor.  Will get screening labs but given her recent encephalopathy on admission, will check urinalysis and chest x-ray to look for other causes of intermittent tachycardia such as occult infection.  Will get a cardiac workup with troponin.  Given her lack of any headache or head injury or neurologic  complaints, will hold on head imaging at this time.  Anticipate  reassessment after lab workup  2:09 PM Patient's workup returned overall reassuring.  Her troponin was negative x 2.  TSH not elevated.  Urinalysis does not show convincing evidence for UTI given lack of urinary symptoms and negative nitrites and leukocytes.  Metabolic panel showed persistent mild hypokalemia but otherwise reassuring.  Hepatic function normal.  CBC shows similar hemoglobin to prior but no leukocytosis.  Chest x-ray showed no pneumonia and EKG showed a persistent A-fib.  Clinically I suspect patient is having some symptomatic palpitations from her A-fib.  She was here for 4 and half hours without any RVR or significant changes in rhythm.  Given her stability and well appearance and reassuring workup I feel she is safe for discharge home to follow-up with her PCP and possibly her cardiologist.  Patient agrees with plan and will be discharged back to facility for symptomatic palpitations but otherwise reassuring workup at this time.          Final Clinical Impression(s) / ED Diagnoses Final diagnoses:  Palpitations    Rx / DC Orders ED Discharge Orders     None       Clinical Impression: 1. Palpitations     Disposition: Discharge  Condition: Good  I have discussed the results, Dx and Tx plan with the pt(& family if present). He/she/they expressed understanding and agree(s) with the plan. Discharge instructions discussed at great length. Strict return precautions discussed and pt &/or family have verbalized understanding of the instructions. No further questions at time of discharge.    New Prescriptions   No medications on file    Follow Up: Hoyt Koch, MD [place] [address] [phone]     your cardiologist        Sinda Leedom, Gwenyth Allegra, MD 07/16/22 [phone]

## 2022-07-19 NOTE — Telephone Encounter (Signed)
Letter done okay to mail to patient or she can pick up.

## 2022-07-20 NOTE — Telephone Encounter (Signed)
This has been mailed out

## 2022-07-21 ENCOUNTER — Telehealth: Payer: Self-pay

## 2022-07-21 NOTE — Telephone Encounter (Signed)
        Patient  visited [place] on 3/8     Telephone encounter attempt :  1st  A HIPAA compliant voice message was left requesting a return call.  Instructed patient to call back .    Yancey Pedley Pop [facility] Guide, [place] [phone] [address], [place], [place] [address] Phone: [phone] Email: [email or web address]       

## 2022-07-22 ENCOUNTER — Telehealth: Payer: Self-pay

## 2022-07-22 NOTE — Telephone Encounter (Signed)
        Patient  visited [place] on 3/8   Telephone encounter attempt : 2nd  A HIPAA compliant voice message was left requesting a return call.  Instructed patient to call back .   Shiva Karis [facility] Care Guide, [place] [phone] [address], [place], [place] [address] Phone: [phone] Email: [email or web address]       

## 2022-08-07 ENCOUNTER — Inpatient Hospital Stay (HOSPITAL_COMMUNITY): Payer: Medicare Other

## 2022-08-07 ENCOUNTER — Inpatient Hospital Stay (HOSPITAL_COMMUNITY)
Admission: EM | Admit: 2022-08-07 | Discharge: 2022-09-08 | DRG: 064 | Disposition: E | Payer: Medicare Other | Attending: Neurology | Admitting: Neurology

## 2022-08-07 ENCOUNTER — Emergency Department (HOSPITAL_COMMUNITY): Payer: Medicare Other

## 2022-08-07 DIAGNOSIS — F32A Depression, unspecified: Secondary | ICD-10-CM | POA: Diagnosis present

## 2022-08-07 DIAGNOSIS — Z515 Encounter for palliative care: Secondary | ICD-10-CM | POA: Diagnosis not present

## 2022-08-07 DIAGNOSIS — Z86711 Personal history of pulmonary embolism: Secondary | ICD-10-CM

## 2022-08-07 DIAGNOSIS — I482 Chronic atrial fibrillation, unspecified: Secondary | ICD-10-CM | POA: Diagnosis not present

## 2022-08-07 DIAGNOSIS — R001 Bradycardia, unspecified: Secondary | ICD-10-CM | POA: Diagnosis present

## 2022-08-07 DIAGNOSIS — G919 Hydrocephalus, unspecified: Secondary | ICD-10-CM | POA: Diagnosis present

## 2022-08-07 DIAGNOSIS — Z8673 Personal history of transient ischemic attack (TIA), and cerebral infarction without residual deficits: Secondary | ICD-10-CM

## 2022-08-07 DIAGNOSIS — Z4682 Encounter for fitting and adjustment of non-vascular catheter: Secondary | ICD-10-CM | POA: Diagnosis not present

## 2022-08-07 DIAGNOSIS — Z8249 Family history of ischemic heart disease and other diseases of the circulatory system: Secondary | ICD-10-CM

## 2022-08-07 DIAGNOSIS — I10 Essential (primary) hypertension: Secondary | ICD-10-CM | POA: Diagnosis present

## 2022-08-07 DIAGNOSIS — E1165 Type 2 diabetes mellitus with hyperglycemia: Secondary | ICD-10-CM | POA: Diagnosis present

## 2022-08-07 DIAGNOSIS — J9601 Acute respiratory failure with hypoxia: Secondary | ICD-10-CM | POA: Diagnosis present

## 2022-08-07 DIAGNOSIS — Z743 Need for continuous supervision: Secondary | ICD-10-CM | POA: Diagnosis not present

## 2022-08-07 DIAGNOSIS — Z823 Family history of stroke: Secondary | ICD-10-CM

## 2022-08-07 DIAGNOSIS — I63532 Cerebral infarction due to unspecified occlusion or stenosis of left posterior cerebral artery: Secondary | ICD-10-CM | POA: Diagnosis not present

## 2022-08-07 DIAGNOSIS — I619 Nontraumatic intracerebral hemorrhage, unspecified: Secondary | ICD-10-CM | POA: Diagnosis not present

## 2022-08-07 DIAGNOSIS — G936 Cerebral edema: Secondary | ICD-10-CM | POA: Diagnosis present

## 2022-08-07 DIAGNOSIS — R404 Transient alteration of awareness: Secondary | ICD-10-CM | POA: Diagnosis not present

## 2022-08-07 DIAGNOSIS — G4733 Obstructive sleep apnea (adult) (pediatric): Secondary | ICD-10-CM | POA: Diagnosis not present

## 2022-08-07 DIAGNOSIS — Z79899 Other long term (current) drug therapy: Secondary | ICD-10-CM | POA: Diagnosis not present

## 2022-08-07 DIAGNOSIS — R1310 Dysphagia, unspecified: Secondary | ICD-10-CM | POA: Diagnosis present

## 2022-08-07 DIAGNOSIS — E785 Hyperlipidemia, unspecified: Secondary | ICD-10-CM | POA: Diagnosis not present

## 2022-08-07 DIAGNOSIS — Z96653 Presence of artificial knee joint, bilateral: Secondary | ICD-10-CM | POA: Diagnosis present

## 2022-08-07 DIAGNOSIS — R6889 Other general symptoms and signs: Secondary | ICD-10-CM | POA: Diagnosis not present

## 2022-08-07 DIAGNOSIS — R29725 NIHSS score 25: Secondary | ICD-10-CM | POA: Diagnosis present

## 2022-08-07 DIAGNOSIS — I4821 Permanent atrial fibrillation: Secondary | ICD-10-CM | POA: Diagnosis not present

## 2022-08-07 DIAGNOSIS — G935 Compression of brain: Secondary | ICD-10-CM | POA: Diagnosis present

## 2022-08-07 DIAGNOSIS — W19XXXA Unspecified fall, initial encounter: Secondary | ICD-10-CM | POA: Diagnosis not present

## 2022-08-07 DIAGNOSIS — E876 Hypokalemia: Secondary | ICD-10-CM | POA: Diagnosis present

## 2022-08-07 DIAGNOSIS — Z66 Do not resuscitate: Secondary | ICD-10-CM | POA: Diagnosis not present

## 2022-08-07 DIAGNOSIS — R402A Nontraumatic coma due to underlying condition: Secondary | ICD-10-CM | POA: Diagnosis not present

## 2022-08-07 DIAGNOSIS — I614 Nontraumatic intracerebral hemorrhage in cerebellum: Principal | ICD-10-CM | POA: Diagnosis present

## 2022-08-07 DIAGNOSIS — I161 Hypertensive emergency: Secondary | ICD-10-CM | POA: Diagnosis not present

## 2022-08-07 DIAGNOSIS — I629 Nontraumatic intracranial hemorrhage, unspecified: Secondary | ICD-10-CM | POA: Diagnosis not present

## 2022-08-07 DIAGNOSIS — D6832 Hemorrhagic disorder due to extrinsic circulating anticoagulants: Secondary | ICD-10-CM | POA: Diagnosis present

## 2022-08-07 DIAGNOSIS — R54 Age-related physical debility: Secondary | ICD-10-CM | POA: Diagnosis present

## 2022-08-07 DIAGNOSIS — I6389 Other cerebral infarction: Secondary | ICD-10-CM | POA: Diagnosis not present

## 2022-08-07 DIAGNOSIS — Z880 Allergy status to penicillin: Secondary | ICD-10-CM

## 2022-08-07 DIAGNOSIS — R4182 Altered mental status, unspecified: Secondary | ICD-10-CM | POA: Diagnosis not present

## 2022-08-07 DIAGNOSIS — E870 Hyperosmolality and hypernatremia: Secondary | ICD-10-CM | POA: Diagnosis present

## 2022-08-07 DIAGNOSIS — M1990 Unspecified osteoarthritis, unspecified site: Secondary | ICD-10-CM | POA: Diagnosis present

## 2022-08-07 DIAGNOSIS — Z86718 Personal history of other venous thrombosis and embolism: Secondary | ICD-10-CM

## 2022-08-07 DIAGNOSIS — Z7901 Long term (current) use of anticoagulants: Secondary | ICD-10-CM

## 2022-08-07 DIAGNOSIS — D649 Anemia, unspecified: Secondary | ICD-10-CM | POA: Diagnosis not present

## 2022-08-07 DIAGNOSIS — I6523 Occlusion and stenosis of bilateral carotid arteries: Secondary | ICD-10-CM | POA: Diagnosis not present

## 2022-08-07 DIAGNOSIS — Z4659 Encounter for fitting and adjustment of other gastrointestinal appliance and device: Secondary | ICD-10-CM | POA: Diagnosis not present

## 2022-08-07 DIAGNOSIS — T45515A Adverse effect of anticoagulants, initial encounter: Secondary | ICD-10-CM | POA: Diagnosis present

## 2022-08-07 DIAGNOSIS — S199XXA Unspecified injury of neck, initial encounter: Secondary | ICD-10-CM | POA: Diagnosis not present

## 2022-08-07 DIAGNOSIS — G911 Obstructive hydrocephalus: Secondary | ICD-10-CM | POA: Diagnosis not present

## 2022-08-07 DIAGNOSIS — Z885 Allergy status to narcotic agent status: Secondary | ICD-10-CM

## 2022-08-07 DIAGNOSIS — Z85828 Personal history of other malignant neoplasm of skin: Secondary | ICD-10-CM | POA: Diagnosis not present

## 2022-08-07 DIAGNOSIS — I499 Cardiac arrhythmia, unspecified: Secondary | ICD-10-CM | POA: Diagnosis not present

## 2022-08-07 DIAGNOSIS — D72829 Elevated white blood cell count, unspecified: Secondary | ICD-10-CM | POA: Diagnosis present

## 2022-08-07 LAB — I-STAT ARTERIAL BLOOD GAS, ED
Acid-base deficit: 2 mmol/L (ref 0.0–2.0)
Bicarbonate: 23.9 mmol/L (ref 20.0–28.0)
Calcium, Ion: 1.18 mmol/L (ref 1.15–1.40)
HCT: 35 % — ABNORMAL LOW (ref 36.0–46.0)
Hemoglobin: 11.9 g/dL — ABNORMAL LOW (ref 12.0–15.0)
O2 Saturation: 91 %
Potassium: 2.9 mmol/L — ABNORMAL LOW (ref 3.5–5.1)
Sodium: 139 mmol/L (ref 135–145)
TCO2: 25 mmol/L (ref 22–32)
pCO2 arterial: 43.4 mmHg (ref 32–48)
pH, Arterial: 7.349 — ABNORMAL LOW (ref 7.35–7.45)
pO2, Arterial: 65 mmHg — ABNORMAL LOW (ref 83–108)

## 2022-08-07 LAB — I-STAT CHEM 8, ED
BUN: 15 mg/dL (ref 8–23)
Calcium, Ion: 1.1 mmol/L — ABNORMAL LOW (ref 1.15–1.40)
Chloride: 103 mmol/L (ref 98–111)
Creatinine, Ser: 0.4 mg/dL — ABNORMAL LOW (ref 0.44–1.00)
Glucose, Bld: 199 mg/dL — ABNORMAL HIGH (ref 70–99)
HCT: 39 % (ref 36.0–46.0)
Hemoglobin: 13.3 g/dL (ref 12.0–15.0)
Potassium: 3.1 mmol/L — ABNORMAL LOW (ref 3.5–5.1)
Sodium: 139 mmol/L (ref 135–145)
TCO2: 25 mmol/L (ref 22–32)

## 2022-08-07 LAB — COMPREHENSIVE METABOLIC PANEL
ALT: 17 U/L (ref 0–44)
AST: 27 U/L (ref 15–41)
Albumin: 4.3 g/dL (ref 3.5–5.0)
Alkaline Phosphatase: 51 U/L (ref 38–126)
Anion gap: 13 (ref 5–15)
BUN: 14 mg/dL (ref 8–23)
CO2: 24 mmol/L (ref 22–32)
Calcium: 9.5 mg/dL (ref 8.9–10.3)
Chloride: 101 mmol/L (ref 98–111)
Creatinine, Ser: 0.68 mg/dL (ref 0.44–1.00)
GFR, Estimated: >60 mL/min (ref >60)
Glucose, Bld: 202 mg/dL — ABNORMAL HIGH (ref 70–99)
Potassium: 3.2 mmol/L — ABNORMAL LOW (ref 3.5–5.1)
Sodium: 138 mmol/L (ref 135–145)
Total Bilirubin: 0.9 mg/dL (ref 0.3–1.2)
Total Protein: 7.7 g/dL (ref 6.5–8.1)

## 2022-08-07 LAB — DIFFERENTIAL
Abs Immature Granulocytes: 0.06 10*3/uL (ref 0.00–0.07)
Basophils Absolute: 0.1 10*3/uL (ref 0.0–0.1)
Basophils Relative: 1 %
Eosinophils Absolute: 0.1 10*3/uL (ref 0.0–0.5)
Eosinophils Relative: 0 %
Immature Granulocytes: 1 %
Lymphocytes Relative: 11 %
Lymphs Abs: 1.5 10*3/uL (ref 0.7–4.0)
Monocytes Absolute: 0.7 10*3/uL (ref 0.1–1.0)
Monocytes Relative: 6 %
Neutro Abs: 10.6 10*3/uL — ABNORMAL HIGH (ref 1.7–7.7)
Neutrophils Relative %: 81 %

## 2022-08-07 LAB — CBC
HCT: 38.9 % (ref 36.0–46.0)
Hemoglobin: 12.3 g/dL (ref 12.0–15.0)
MCH: 28.7 pg (ref 26.0–34.0)
MCHC: 31.6 g/dL (ref 30.0–36.0)
MCV: 90.9 fL (ref 80.0–100.0)
Platelets: 218 10*3/uL (ref 150–400)
RBC: 4.28 MIL/uL (ref 3.87–5.11)
RDW: 14.2 % (ref 11.5–15.5)
WBC: 12.9 10*3/uL — ABNORMAL HIGH (ref 4.0–10.5)
nRBC: 0 % (ref 0.0–0.2)

## 2022-08-07 LAB — APTT: aPTT: 28 seconds (ref 24–36)

## 2022-08-07 LAB — SODIUM: Sodium: 140 mmol/L (ref 135–145)

## 2022-08-07 LAB — ETHANOL: Alcohol, Ethyl (B): <10 mg/dL (ref <10)

## 2022-08-07 LAB — MRSA NEXT GEN BY PCR, NASAL: MRSA by PCR Next Gen: NOT DETECTED

## 2022-08-07 LAB — PROTIME-INR
INR: 1.2 (ref 0.8–1.2)
Prothrombin Time: 15.1 seconds (ref 11.4–15.2)

## 2022-08-07 LAB — CBG MONITORING, ED: Glucose-Capillary: 188 mg/dL — ABNORMAL HIGH (ref 70–99)

## 2022-08-07 MED ORDER — ACETAMINOPHEN 325 MG PO TABS: 650.0000 mg | ORAL_TABLET | ORAL | Status: DC | PRN

## 2022-08-07 MED ORDER — FENTANYL CITRATE PF 50 MCG/ML IJ SOSY: 50.0000 ug | PREFILLED_SYRINGE | Freq: Once | INTRAMUSCULAR | Status: AC

## 2022-08-07 MED ORDER — STROKE: EARLY STAGES OF RECOVERY BOOK: Freq: Once | Status: AC

## 2022-08-07 MED ORDER — SUCCINYLCHOLINE CHLORIDE 200 MG/10ML IV SOSY
PREFILLED_SYRINGE | INTRAVENOUS | Status: AC
  Administered 2022-08-07: 120 mg
  Filled 2022-08-07: qty 20

## 2022-08-07 MED ORDER — EMPTY CONTAINERS FLEXIBLE MISC
900.0000 mg | Freq: Once | Status: AC
  Administered 2022-08-07: 900 mg via INTRAVENOUS
  Filled 2022-08-07: qty 90

## 2022-08-07 MED ORDER — ACETAMINOPHEN 650 MG RE SUPP: 650.0000 mg | RECTAL | Status: DC | PRN

## 2022-08-07 MED ORDER — ORAL CARE MOUTH RINSE
15.0000 mL | OROMUCOSAL | Status: DC
  Administered 2022-08-07 – 2022-08-09 (×24): 15 mL via OROMUCOSAL

## 2022-08-07 MED ORDER — ATROPINE SULFATE 1 MG/10ML IJ SOSY
PREFILLED_SYRINGE | INTRAMUSCULAR | Status: AC
  Filled 2022-08-07: qty 10

## 2022-08-07 MED ORDER — PANTOPRAZOLE SODIUM 40 MG IV SOLR
40.0000 mg | Freq: Every day | INTRAVENOUS | Status: DC
  Administered 2022-08-07 – 2022-08-08 (×2): 40 mg via INTRAVENOUS
  Filled 2022-08-07 (×2): qty 10

## 2022-08-07 MED ORDER — SODIUM CHLORIDE 3 % IV BOLUS
250.0000 mL | Freq: Once | INTRAVENOUS | Status: AC
  Administered 2022-08-07: 250 mL via INTRAVENOUS
  Filled 2022-08-07: qty 500

## 2022-08-07 MED ORDER — SODIUM CHLORIDE 0.9% FLUSH
3.0000 mL | Freq: Once | INTRAVENOUS | Status: AC
  Administered 2022-08-07: 3 mL via INTRAVENOUS

## 2022-08-07 MED ORDER — CLEVIDIPINE BUTYRATE 0.5 MG/ML IV EMUL
0.0000 mg/h | INTRAVENOUS | Status: DC
  Administered 2022-08-07: 8 mg/h via INTRAVENOUS
  Administered 2022-08-07: 4 mg/h via INTRAVENOUS
  Administered 2022-08-08: 12 mg/h via INTRAVENOUS
  Administered 2022-08-08: 14 mg/h via INTRAVENOUS
  Administered 2022-08-08: 12 mg/h via INTRAVENOUS
  Administered 2022-08-08: 14 mg/h via INTRAVENOUS
  Administered 2022-08-08 – 2022-08-09 (×2): 6 mg/h via INTRAVENOUS
  Administered 2022-08-09 (×2): 12 mg/h via INTRAVENOUS
  Filled 2022-08-07 (×3): qty 50
  Filled 2022-08-07 (×3): qty 100
  Filled 2022-08-07: qty 50
  Filled 2022-08-07 (×4): qty 100

## 2022-08-07 MED ORDER — ACETAMINOPHEN 160 MG/5ML PO SOLN
650.0000 mg | ORAL | Status: DC | PRN
  Administered 2022-08-08 – 2022-08-09 (×2): 650 mg
  Filled 2022-08-07 (×2): qty 20.3

## 2022-08-07 MED ORDER — SENNOSIDES-DOCUSATE SODIUM 8.6-50 MG PO TABS
1.0000 | ORAL_TABLET | Freq: Two times a day (BID) | ORAL | Status: DC
  Filled 2022-08-07: qty 1

## 2022-08-07 MED ORDER — ORAL CARE MOUTH RINSE: 15.0000 mL | OROMUCOSAL | Status: DC | PRN

## 2022-08-07 MED ORDER — FENTANYL BOLUS VIA INFUSION: 25.0000 ug | INTRAVENOUS | Status: DC | PRN

## 2022-08-07 MED ORDER — ATROPINE SULFATE 1 MG/ML IV SOLN
0.4000 mg | INTRAVENOUS | Status: DC | PRN
  Filled 2022-08-07: qty 1

## 2022-08-07 MED ORDER — FENTANYL 2500MCG IN NS 250ML (10MCG/ML) PREMIX INFUSION
25.0000 ug/h | INTRAVENOUS | Status: DC
  Administered 2022-08-08: 25 ug/h via INTRAVENOUS
  Filled 2022-08-07: qty 250

## 2022-08-07 MED ORDER — LABETALOL HCL 5 MG/ML IV SOLN: 10.0000 mg | Freq: Once | INTRAVENOUS | Status: DC

## 2022-08-07 MED ORDER — ATROPINE SULFATE 1 MG/10ML IJ SOSY
PREFILLED_SYRINGE | INTRAMUSCULAR | Status: AC
  Administered 2022-08-07: 1 mg
  Filled 2022-08-07: qty 10

## 2022-08-07 MED ORDER — POTASSIUM CHLORIDE 20 MEQ PO PACK
40.0000 meq | PACK | ORAL | Status: AC
  Administered 2022-08-07: 40 meq
  Filled 2022-08-07: qty 2

## 2022-08-07 MED ORDER — SODIUM CHLORIDE 3 % IV SOLN
INTRAVENOUS | Status: DC
  Filled 2022-08-07 (×7): qty 500

## 2022-08-07 MED ORDER — SENNOSIDES-DOCUSATE SODIUM 8.6-50 MG PO TABS
1.0000 | ORAL_TABLET | Freq: Two times a day (BID) | ORAL | Status: DC
  Administered 2022-08-08 – 2022-08-09 (×2): 1
  Filled 2022-08-07 (×2): qty 1

## 2022-08-07 MED ORDER — CHLORHEXIDINE GLUCONATE CLOTH 2 % EX PADS
6.0000 | MEDICATED_PAD | Freq: Every day | CUTANEOUS | Status: DC
  Administered 2022-08-07: 6 via TOPICAL

## 2022-08-07 MED ORDER — FENTANYL CITRATE PF 50 MCG/ML IJ SOSY
PREFILLED_SYRINGE | INTRAMUSCULAR | Status: AC
  Administered 2022-08-07: 50 ug via INTRAVENOUS
  Filled 2022-08-07: qty 1

## 2022-08-07 MED ORDER — CLEVIDIPINE BUTYRATE 0.5 MG/ML IV EMUL
0.0000 mg/h | INTRAVENOUS | Status: DC
  Administered 2022-08-07: 2 mg/h via INTRAVENOUS

## 2022-08-07 MED ORDER — PROPOFOL 1000 MG/100ML IV EMUL
0.0000 ug/kg/min | INTRAVENOUS | Status: DC
  Administered 2022-08-07: 10 ug/kg/min via INTRAVENOUS
  Administered 2022-08-08: 20 ug/kg/min via INTRAVENOUS
  Administered 2022-08-08 (×2): 15 ug/kg/min via INTRAVENOUS
  Administered 2022-08-09: 10 ug/kg/min via INTRAVENOUS
  Filled 2022-08-07 (×4): qty 100

## 2022-08-07 MED ORDER — ETOMIDATE 2 MG/ML IV SOLN
INTRAVENOUS | Status: AC
  Administered 2022-08-07: 20 mg via INTRAVENOUS
  Filled 2022-08-07: qty 20

## 2022-08-07 NOTE — ED Provider Notes (Signed)
Emergency Department Provider Note   I have reviewed the triage vital signs and the nursing notes.   HISTORY  Chief Complaint Code Stroke   HPI Samantha Bishop is a 89 y.o. female presents emergency department after being found down at her independent living facility.  Arrives as a code stroke with concern of ICH. Level 5 caveat: AMS   Past Medical History:  Diagnosis Date   Anxiety state, unspecified    Calculus of kidney    Cerebral aneurysm, nonruptured    Colon polyp    Complication of anesthesia    "my heart stopped" 2004 knee  surg - see note on chart   DEEP VENOUS THROMBOPHLEBITIS 08/15/2007   Qualifier: History of  By: Lenna Gilford MD, Deborra Medina    Diverticulosis of colon (without mention of hemorrhage)    History of skin cancer    HTN (hypertension)    Hyperlipidemia    Lumbago    Osteoarthrosis, unspecified whether generalized or localized, unspecified site    Permanent atrial fibrillation ([place])    Pulmonary emboli ([place])    following remote knee arthroscopy surgery   Ruptured lumbar disc    Sleep apnea    stop bang score 4    Unspecified hemorrhoids without mention of complication     Review of Systems  Level 5 caveat: AMS  ____________________________________________   PHYSICAL EXAM:  VITAL SIGNS: ED Triage Vitals  Enc Vitals Group     BP 08/03/2022 1721 (!) 216/74     Pulse Rate 07/29/2022 1721 64     Resp 07/17/2022 1721 20     Temp --      Temp src --      SpO2 08/01/2022 1721 99 %     Weight 07/30/2022 1700 175 lb 14.8 oz (79.8 kg)   Constitutional: Frail. Vomiting.  Eyes: Conjunctivae are normal. Pupils 2 mm  Head: Atraumatic. Nose: No congestion/rhinnorhea. Mouth/Throat: Mucous membranes are moist.  Neck: No stridor. Cardiovascular: Normal rate, regular rhythm. Good peripheral circulation. Grossly normal heart sounds.   Respiratory: Normal respiratory effort.  No retractions. Lungs CTAB. Gastrointestinal: Soft and nontender. No distention.   Musculoskeletal: No gross deformities of extremities. Skin:  Skin is warm, dry and intact. No rash noted.  ____________________________________________   LABS (all labs ordered are listed, but only abnormal results are displayed)  Labs Reviewed  CBC - Abnormal; Notable for the following components:      Result Value   WBC 12.9 (*)    All other components within normal limits  DIFFERENTIAL - Abnormal; Notable for the following components:   Neutro Abs 10.6 (*)    All other components within normal limits  COMPREHENSIVE METABOLIC PANEL - Abnormal; Notable for the following components:   Potassium 3.2 (*)    Glucose, Bld 202 (*)    All other components within normal limits  CBC - Abnormal; Notable for the following components:   WBC 12.0 (*)    Hemoglobin 11.9 (*)    All other components within normal limits  SODIUM - Abnormal; Notable for the following components:   Sodium 149 (*)    All other components within normal limits  SODIUM - Abnormal; Notable for the following components:   Sodium 152 (*)    All other components within normal limits  COMPREHENSIVE METABOLIC PANEL - Abnormal; Notable for the following components:   Chloride 115 (*)    CO2 20 (*)    Glucose, Bld 195 (*)    All other  components within normal limits  HEMOGLOBIN A1C - Abnormal; Notable for the following components:   Hgb A1c MFr Bld 6.6 (*)    All other components within normal limits  GLUCOSE, CAPILLARY - Abnormal; Notable for the following components:   Glucose-Capillary 196 (*)    All other components within normal limits  SODIUM - Abnormal; Notable for the following components:   Sodium 158 (*)    All other components within normal limits  GLUCOSE, CAPILLARY - Abnormal; Notable for the following components:   Glucose-Capillary 193 (*)    All other components within normal limits  BASIC METABOLIC PANEL - Abnormal; Notable for the following components:   Sodium 155 (*)    Potassium 2.6 (*)     Chloride 126 (*)    CO2 19 (*)    Glucose, Bld 154 (*)    All other components within normal limits  GLUCOSE, CAPILLARY - Abnormal; Notable for the following components:   Glucose-Capillary 186 (*)    All other components within normal limits  GLUCOSE, CAPILLARY - Abnormal; Notable for the following components:   Glucose-Capillary 148 (*)    All other components within normal limits  GLUCOSE, CAPILLARY - Abnormal; Notable for the following components:   Glucose-Capillary 165 (*)    All other components within normal limits  CBC - Abnormal; Notable for the following components:   WBC 15.0 (*)    Hemoglobin 11.7 (*)    All other components within normal limits  GLUCOSE, CAPILLARY - Abnormal; Notable for the following components:   Glucose-Capillary 156 (*)    All other components within normal limits  GLUCOSE, CAPILLARY - Abnormal; Notable for the following components:   Glucose-Capillary 155 (*)    All other components within normal limits  I-STAT CHEM 8, ED - Abnormal; Notable for the following components:   Potassium 3.1 (*)    Creatinine, Ser 0.40 (*)    Glucose, Bld 199 (*)    Calcium, Ion 1.10 (*)    All other components within normal limits  CBG MONITORING, ED - Abnormal; Notable for the following components:   Glucose-Capillary 188 (*)    All other components within normal limits  I-STAT ARTERIAL BLOOD GAS, ED - Abnormal; Notable for the following components:   pH, Arterial 7.349 (*)    pO2, Arterial 65 (*)    Potassium 2.9 (*)    HCT 35.0 (*)    Hemoglobin 11.9 (*)    All other components within normal limits  MRSA NEXT GEN BY PCR, NASAL  PROTIME-INR  APTT  ETHANOL  SODIUM  SODIUM  TRIGLYCERIDES  MAGNESIUM  MAGNESIUM  LIPID PANEL   ____________________________________________  EKG   EKG Interpretation  Date/Time:  Saturday August 07 2022 18:35:18 EDT Ventricular Rate:  65 PR Interval:    QRS Duration: 99 QT Interval:  513 QTC Calculation: 534 R  Axis:   91 Text Interpretation: Atrial fibrillation Right axis deviation Probable anteroseptal infarct, old Minimal ST depression, inferior leads Prolonged QT interval Confirmed by Garnette Gunner [phone]) on 08/10/2022 10:08:45 AM        ____________________________________________   PROCEDURES  Procedure(s) performed:   Procedures  CRITICAL CARE Performed by: Margette Fast Total critical care time: 35 minutes Critical care time was exclusive of separately billable procedures and treating other patients. Critical care was necessary to treat or prevent imminent or life-threatening deterioration. Critical care was time spent personally by me on the following activities: development of treatment plan with patient and/or surrogate  as well as nursing, discussions with consultants, evaluation of patient's response to treatment, examination of patient, obtaining history from patient or surrogate, ordering and performing treatments and interventions, ordering and review of laboratory studies, ordering and review of radiographic studies, pulse oximetry and re-evaluation of patient's condition.  Nanda Quinton, MD Emergency Medicine  ____________________________________________   INITIAL IMPRESSION / ASSESSMENT AND PLAN / ED COURSE  Pertinent labs & imaging results that were available during my care of the patient were reviewed by me and considered in my medical decision making (see chart for details).   This patient is Presenting for Evaluation of AMS, which does require a range of treatment options, and is a complaint that involves a high risk of morbidity and mortality.  The Differential Diagnoses includes but is not exclusive to alcohol, illicit or prescription medications, intracranial pathology such as stroke, intracerebral hemorrhage, fever or infectious causes including sepsis, hypoxemia, uremia, trauma, endocrine related disorders such as diabetes, hypoglycemia, thyroid-related  diseases, etc.   Critical Interventions-    Medications  potassium chloride (KLOR-CON) packet 40 mEq (40 mEq Per Tube Given 07/20/2022 2206)  sodium chloride flush (NS) 0.9 % injection 3 mL (3 mLs Intravenous Given 08/05/2022 1752)  coag fact Xa recombinant (ANDEXXA) low dose infusion 900 mg (0 mg Intravenous Stopping previously hung infusion 07/16/2022 1950)  succinylcholine (ANECTINE) 200 MG/10ML syringe (120 mg  Given 07/26/2022 1750)  etomidate (AMIDATE) 2 MG/ML injection (20 mg Intravenous Given 07/09/2022 1749)  sodium chloride 3% (hypertonic) IV bolus 250 mL (0 mLs Intravenous Stopped 08/03/2022 1834)   stroke: early stages of recovery book ( Does not apply Given 08/08/22 0914)  fentaNYL (SUBLIMAZE) injection 50 mcg (50 mcg Intravenous Given 07/21/2022 1820)  atropine 1 MG/10ML injection (1 mg  Given 07/11/2022 2048)  iohexol (OMNIPAQUE) 350 MG/ML injection 75 mL (75 mLs Intravenous Contrast Given 08/08/22 0541)  potassium chloride 10 mEq in 100 mL IVPB ( Intravenous Infusion Verify 08/25/2022 1000)  potassium chloride (KLOR-CON) packet 40 mEq (40 mEq Per Tube Given 08/14/2022 0728)  potassium chloride (KLOR-CON) packet 40 mEq (40 mEq Per Tube Given 08/20/2022 1123)    Reassessment after intervention:  patient minimally responsive   I did obtain Additional Historical Information from EMS.   Clinical Laboratory Tests Ordered, included leukocytosis on CBC. No AKI.   Radiologic Tests Ordered, included CT head. I independently interpreted the images and agree with radiology interpretation.   Medical Decision Making: Summary:  Patient arrives to the ED with AMS as a Code Stroke. REquired airway protection on arrival and intubated. Patient taken to CT showing large ICH.    Patient's presentation is most consistent with acute presentation with potential threat to life or bodily function.   Disposition: admit  ____________________________________________  FINAL CLINICAL IMPRESSION(S) / ED DIAGNOSES  Final  diagnoses:  Cerebellar hemorrhage ([place])    Note:  This document was prepared using Dragon voice recognition software and may include unintentional dictation errors.  Nanda Quinton, MD, [facility] Emergency Medicine    Katiejo Gilroy, [facility], MD 08/11/22 [phone]

## 2022-08-07 NOTE — Progress Notes (Signed)
Transported patient to C.T while patient was on the mechanical ventilator. Patient remained stable during transport.

## 2022-08-07 NOTE — Progress Notes (Signed)
[place] Progress Note Patient Name: Samantha Bishop DOB: Apr 05, 1925 MRN: [id number]   Date of Service  07/28/2022  HPI/Events of Note  Pt with episodes of bradycardia, likely due to increased ICP  eICU Interventions  Pt given atropine with good response.   Called pt's son [name]  [phone]) and updated him of the above event and spoke with him about the patient's poor prognosis.  At this point, he was agreeable to changing the patient's code status to DNR.  Updated code status in the chart.         [place] 08/05/2022, 9:05 PM

## 2022-08-07 NOTE — ED Triage Notes (Signed)
Pt BIB EMS from [facility], pt able to care for self at baseline. Pt had an unwitnessed fall and was found by staff. Pt found on the floor with AMS, unknown LKW. Pt c/o headache initially with EMS. Pt vomited dark brown emesis then became minimally responsive enroute to ED, NRB placed on pt found to have pinpoint pupils. C-collar in place on arrival to ED. Pt on Eliquis.  EMS VS: 220/100 HR 60 CBG 220

## 2022-08-07 NOTE — H&P (Signed)
NEUROLOGY CONSULTATION NOTE   Date of service: August 07, 2022 Patient Name: Samantha Bishop MRN:  [id number] DOB:  1924-12-15 _ _ _   _ __   _ __ _ _  __ __   _ __   __ _  History of Present Illness  Samantha Bishop is a 89 y.o. female with PMH significant for [facility] on [facility], hx of DVTs who was found down at her independent living facility. EMS called and she was brought in to the ED and intubated for airway. CT Head demonstrates a large cerebellar bleed with mass effect.  ***   ROS   Constitutional Denies weight loss, fever and chills. ***  HEENT Denies changes in vision and hearing. ***  Respiratory Denies SOB and cough. ***  CV Denies palpitations and CP ***  GI Denies abdominal pain, nausea, vomiting and diarrhea. ***  GU Denies dysuria and urinary frequency. ***  MSK Denies myalgia and joint pain. ***  Skin Denies rash and pruritus. ***  Neurological Denies headache and syncope. ***  Psychiatric Denies recent changes in mood. Denies anxiety and depression. ***   Past History   Past Medical History:  Diagnosis Date   Anxiety state, unspecified    Calculus of kidney    Cerebral aneurysm, nonruptured    Colon polyp    Complication of anesthesia    "my heart stopped" 2004 knee  surg - see note on chart   DEEP VENOUS THROMBOPHLEBITIS 08/15/2007   Qualifier: History of  By: Lenna Gilford MD, Deborra Medina    Diverticulosis of colon (without mention of hemorrhage)    History of skin cancer    HTN (hypertension)    Hyperlipidemia    Lumbago    Osteoarthrosis, unspecified whether generalized or localized, unspecified site    Permanent atrial fibrillation ([place])    Pulmonary emboli ([place])    following remote knee arthroscopy surgery   Ruptured lumbar disc    Sleep apnea    stop bang score 4    Unspecified hemorrhoids without mention of complication    Past Surgical History:  Procedure Laterality Date   ABDOMINAL HYSTERECTOMY     APPENDECTOMY     BLOCKED INTESTINE SURGERY     BREAST CYST  EXCISION     ESOPHAGOGASTRODUODENOSCOPY (EGD) WITH PROPOFOL N/A 05/30/2019   Procedure: ESOPHAGOGASTRODUODENOSCOPY (EGD) WITH PROPOFOL;  Surgeon: Thornton Park, MD;  Location: [place];  Service: Gastroenterology;  Laterality: N/A;   HEMHORROIDECTOMY     JOINT REPLACEMENT  2004   RT TOTAL KNEE   KNEE ARTHROSCOPY     X2 L KNEE   KNEE SURGERY     NASAL SURGERY X2     TONSILLECTOMY AND ADENOIDECTOMY     TOTAL KNEE ARTHROPLASTY  09/03/2011   Procedure: TOTAL KNEE ARTHROPLASTY;  Surgeon: Gearlean Alf, MD;  Location: [facility];  Service: Orthopedics;  Laterality: Left;   Family History  Problem Relation Age of Onset   Coronary artery disease Father    Stroke Father    Cirrhosis Brother    Colon polyps Neg Hx    Kidney disease Neg Hx    Diabetes Neg Hx    Social History   Socioeconomic History   Marital status: Single    Spouse name: Not on file   Number of children: 3   Years of education: Not on file   Highest education level: Not on file  Occupational History   Occupation: Retired  Tobacco Use   Smoking status: Never  Smokeless tobacco: Never  Vaping Use   Vaping Use: Never used  Substance and Sexual Activity   Alcohol use: Not Currently    Comment: Rarely   Drug use: No   Sexual activity: Not Currently  Other Topics Concern   Not on file  Social History Narrative   Not on file   Social Determinants of Health   Financial Resource Strain: Low Risk  (04/29/2021)   Overall Financial Resource Strain (CARDIA)    Difficulty of Paying Living Expenses: Not hard at all  Food Insecurity: No Food Insecurity (06/01/2022)   Hunger Vital Sign    Worried About Running Out of Food in the Last Year: Never true    Ran Out of Food in the Last Year: Never true  Transportation Needs: No Transportation Needs (06/01/2022)   PRAPARE - Hydrologist (Medical): No    Lack of Transportation (Non-Medical): No  Physical Activity: Inactive (04/29/2021)    Exercise Vital Sign    Days of Exercise per Week: 0 days    Minutes of Exercise per Session: 0 min  Stress: No Stress Concern Present (04/29/2021)   [place]    Feeling of Stress : Not at all  Social Connections: Moderately Integrated (04/29/2021)   Social Connection and Isolation Panel [NHANES]    Frequency of Communication with Friends and Family: More than three times a week    Frequency of Social Gatherings with Friends and Family: Once a week    Attends Religious Services: More than 4 times per year    Active Member of [employer] or Organizations: Yes    Attends Archivist Meetings: More than 4 times per year    Marital Status: Widowed   Allergies  Allergen Reactions   Codeine Nausea Only   Penicillins Rash    Medications   Medications Prior to Admission  Medication Sig Dispense Refill Last Dose   apixaban (ELIQUIS) 5 MG TABS tablet TAKE 1 TABLET (5 MG TOTAL) BY MOUTH 2 (TWO) TIMES DAILY. (Patient taking differently: Take 5 mg by mouth 2 (two) times daily.) 180 tablet 3 UNK   cyanocobalamin (VITAMIN B12) 1000 MCG tablet Take 1 tablet (1,000 mcg total) by mouth daily. 60 tablet 1 UNK   escitalopram (LEXAPRO) 5 MG tablet Take 1 tablet (5 mg total) by mouth daily. 90 tablet 3 UNK   gabapentin (NEURONTIN) 300 MG capsule Take 2 capsules (600 mg total) by mouth 3 (three) times daily. 460 capsule 3 UNK   hydrochlorothiazide (MICROZIDE) 12.5 MG capsule TAKE 1 CAPSULE(12.5 MG) BY MOUTH DAILY (Patient taking differently: Take 12.5 mg by mouth daily.) 90 capsule 3 UNK   Multiple Vitamin (MULTIVITAMIN WITH MINERALS) TABS tablet Take 1 tablet by mouth daily.   UNK   pravastatin (PRAVACHOL) 20 MG tablet TAKE 1 TABLET(20 MG) BY MOUTH DAILY AT 6 PM (Patient taking differently: Take 20 mg by mouth every evening.) 90 tablet 3 UNK   sodium chloride (OCEAN) 0.65 % SOLN nasal spray Place 1 spray into both nostrils as needed for  congestion. (Patient taking differently: Place 1 spray into both nostrils 2 (two) times daily as needed for congestion.) 60 mL 11 UNK   metoprolol tartrate (LOPRESSOR) 25 MG tablet Take 1 tablet (25 mg total) by mouth 2 (two) times daily. (Patient not taking: Reported on 07/24/2022) 30 tablet 0 Not Taking     Vitals   Vitals:   07/16/2022 1800 07/16/2022 1805 08/01/2022  1810 07/20/2022 1815  BP: (!) 150/61 (!) 144/90 (!) 132/53 (!) 135/48  Pulse: 79 67 75 69  Resp: 16 15 16 15   SpO2: 100% 100% 100% 99%  Weight:         Body mass index is 27.55 kg/m.  Physical Exam   General: Laying comfortably in bed; in no acute distress. *** HENT: Normal oropharynx and mucosa. Normal external appearance of ears and nose. *** Neck: Supple, no pain or tenderness *** CV: No JVD. No peripheral edema. *** Pulmonary: Symmetric Chest rise. Normal respiratory effort. *** Abdomen: Soft to touch, non-tender. *** Ext: No cyanosis, edema, or deformity *** Skin: No rash. Normal palpation of skin.  *** Musculoskeletal: Normal digits and nails by inspection. No clubbing. ***  Neurologic Examination  Mental status/Cognition: Alert, oriented to self, place, month and year, good attention. *** Speech/language: Fluent, comprehension intact, object naming intact, repetition intact. *** Cranial nerves:   CN II Pupils equal and reactive to light, no VF deficits ***   CN III,IV,VI EOM intact, no gaze preference or deviation, no nystagmus ***   CN V normal sensation in V1, V2, and V3 segments bilaterally ***   CN VII no asymmetry, no nasolabial fold flattening ***   CN VIII normal hearing to speech ***   CN IX & X normal palatal elevation, no uvular deviation ***   CN XI 5/5 head turn and 5/5 shoulder shrug bilaterally ***   CN XII midline tongue protrusion ***   Motor:  Muscle bulk: ***, tone ***, pronator drift *** tremor *** Mvmt Root Nerve  Muscle Right Left Comments  SA C5/6 Ax Deltoid     EF C5/6 Mc Biceps      EE C6/7/8 Rad Triceps     WF C6/7 Med FCR     WE C7/8 PIN ECU     F Ab C8/T1 U ADM/FDI     HF L1/2/3 Fem Illopsoas     KE L2/3/4 Fem Quad     DF L4/5 D Peron Tib Ant     PF S1/2 Tibial Grc/Sol      Reflexes:  Right Left Comments  Pectoralis      Biceps (C5/6)     Brachioradialis (C5/6)      Triceps (C6/7)      Patellar (L3/4)      Achilles (S1)      Hoffman      Plantar     Jaw jerk    Sensation:  Light touch    Pin prick    Temperature    Vibration   Proprioception    Coordination/Complex Motor:  - Finger to Nose *** - Heel to shin *** - Rapid alternating movement *** - Gait: Stride length ***. Arm swing ***. Base width ***  Labs   CBC:  Recent Labs  Lab 07/30/2022 1719 07/27/2022 1725 08/05/2022 1834  WBC 12.9*  --   --   NEUTROABS 10.6*  --   --   HGB 12.3 13.3 11.9*  HCT 38.9 39.0 35.0*  MCV 90.9  --   --   PLT 218  --   --     Basic Metabolic Panel:  Lab Results  Component Value Date   NA 139 07/31/2022   K 2.9 (L) 08/04/2022   CO2 24 07/12/2022   GLUCOSE 199 (H) 08/02/2022   BUN 15 08/08/2022   CREATININE 0.40 (L) 07/16/2022   CALCIUM 9.5 07/19/2022   GFRNONAA >60 07/17/2022   GFRAA >90 06/26/2019  Lipid Panel:  Lab Results  Component Value Date   LDLCALC 65 01/07/2022   HgbA1c:  Lab Results  Component Value Date   HGBA1C 6.3 (H) 06/27/2016   Urine Drug Screen:     Component Value Date/Time   LABOPIA NONE DETECTED 05/29/2022 1544   COCAINSCRNUR NONE DETECTED 05/29/2022 1544   LABBENZ NONE DETECTED 05/29/2022 1544   AMPHETMU NONE DETECTED 05/29/2022 1544   THCU NONE DETECTED 05/29/2022 1544   LABBARB NONE DETECTED 05/29/2022 1544    Alcohol Level     Component Value Date/Time   ETH <10 07/22/2022 1719    CT Head without contrast(Personally reviewed): ***  CT angio Head and Neck with contrast(Personally reviewed): ***  MR Angio head without contrast and Carotid Duplex BL(Personally reviewed): ***  MRI Brain(Personally  reviewed): ***  rEEG:  ***  Impression   Samantha Bishop is a 89 y.o. female with PMH significant for ***. Her neurologic examination is notable for ***.  Primary Diagnosis:  {Cerebral Infarction:22351}  Secondary Diagnosis: {Stroke Comorbidities:21266}  Recommendations  *** ______________________________________________________________________   Thank you for the opportunity to take part in the care of this patient. If you have any further questions, please contact the neurology consultation attending.  Signed,  [place] Pager Number [phone] _ _ _   _ __   _ __ _ _  __ __   _ __   __ _

## 2022-08-07 NOTE — H&P (Signed)
NAME:  BRIGGITTE ROSENBALM, MRN:  [id number], DOB:  [birth year], LOS: 0 ADMISSION DATE:  08/06/2022, CONSULTATION DATE:  07/25/2022 REFERRING MD:  Neuro, CHIEF COMPLAINT:  found down   History of Present Illness:  This is a patient with a history of atrial fibrillation who was found down at her independent living SNF.  In the ER she was intubated for airway protection, imaging revealed a large cerebellar hemorrhage with mass effect.  She was deemed not to be an operative candidate and is admitted to the neurology service.  She has been started on 3% saline.  Pulmonology is consulted to assist with ventilator management.  Unfortunately family has been unable to be reached, the patient remains full code at present.  The patient is currently intubated and sedated.  Pertinent  Medical History  Hypertension Atrial fibrillation on AC Distant history of PE DJD Osteoarthritis  Significant Hospital Events: Including procedures, antibiotic start and stop dates in addition to other pertinent events     Interim History / Subjective:  Consulted  Objective   Blood pressure (!) 135/48, pulse 69, resp. rate 15, weight 79.8 kg, SpO2 99 %.    Vent Mode: PRVC FiO2 (%):  [60 %] 60 % Set Rate:  [15 bmp] 15 bmp Vt Set:  [460 mL-490 mL] 490 mL PEEP:  [5 cmH20] 5 cmH20 Plateau Pressure:  [14 cmH20] 14 cmH20   Intake/Output Summary (Last 24 hours) at 07/19/2022 1835 Last data filed at 08/05/2022 [id number] Gross per 24 hour  Intake 264.13 ml  Output --  Net 264.13 ml   Filed Weights   07/18/2022 1700  Weight: 79.8 kg    Examination: General: Frail elderly woman on ventilator, sedated HENT: Pupils small, equal, not tracking Lungs: Lungs are clear to auscultation with maybe some faint crackles at bases, passive on ventilator Cardiovascular: Heart sounds are irregularly irregular, strong distal pulses Abdomen: Soft, hypoactive bowel sounds Extremities: Stigmata of arthritis, no clubbing or edema Neuro: She  will withdrawal to pain to me x 4, positive cough and gag Skin: age related changes  Postintubation blood gas looks okay Blood chemistry showing low potassium CBC mild leukocytosis, platelets and hemoglobin are okay  Chest x-ray with mild pulmonary edema pattern  CT head personally reviewed, left cerebellar bleed with herniation and significant mass effect  Resolved Hospital Problem list   N/A  Assessment & Plan:  Acute cerebellar hemorrhage with brain compression and coma Acute hypoxic respiratory failure in setting of above and inability protect airway Hypokalemia  -Vent bundle -Pad order set, propofol and as needed fentanyl as ordered - Cleviprex goal SBP per neuro -Need to find family to clarify goals of care as the bleed appears nonsurvivable -Hypertonic saline and adnexa per primary Will follow with you  Best Practice (right click and "Reselect all SmartList Selections" daily)   Diet/type: NPO DVT prophylaxis: not indicated GI prophylaxis: PPI Lines: N/A Foley:  Yes, and it is still needed Code Status:  full code Last date of multidisciplinary goals of care discussion [attempting to find family]  Labs   CBC: Recent Labs  Lab 08/02/2022 1719 08/06/2022 1725  WBC 12.9*  --   NEUTROABS 10.6*  --   HGB 12.3 13.3  HCT 38.9 39.0  MCV 90.9  --   PLT 218  --     Basic Metabolic Panel: Recent Labs  Lab 07/18/2022 1719 08/06/2022 1725  NA 138 139  K 3.2* 3.1*  CL 101 103  CO2 24  --  GLUCOSE 202* 199*  BUN 14 15  CREATININE 0.68 0.40*  CALCIUM 9.5  --    GFR: Estimated Creatinine Clearance: 45.8 mL/min (A) (by C-G formula based on SCr of 0.4 mg/dL (L)). Recent Labs  Lab 07/12/2022 1719  WBC 12.9*    Liver Function Tests: Recent Labs  Lab 07/31/2022 1719  AST 27  ALT 17  ALKPHOS 51  BILITOT 0.9  PROT 7.7  ALBUMIN 4.3   No results for input(s): "LIPASE", "AMYLASE" in the last 168 hours. No results for input(s): "AMMONIA" in the last 168  hours.  ABG    Component Value Date/Time   HCO3 23.7 05/29/2022 1736   TCO2 25 08/04/2022 1725   O2SAT 99 05/29/2022 1736     Coagulation Profile: Recent Labs  Lab 08/02/2022 1719  INR 1.2    Cardiac Enzymes: No results for input(s): "CKTOTAL", "CKMB", "CKMBINDEX", "TROPONINI" in the last 168 hours.  HbA1C: Hgb A1c MFr Bld  Date/Time Value Ref Range Status  06/27/2016 05:08 AM 6.3 (H) 4.8 - 5.6 % Final    Comment:    (NOTE)         Pre-diabetes: 5.7 - 6.4         Diabetes: >6.4         Glycemic control for adults with diabetes: <7.0   08/22/2012 12:39 PM 6.2 4.6 - 6.5 % Final    Comment:    Glycemic Control Guidelines for People with Diabetes:Non Diabetic:  <6%Goal of Therapy: <7%Additional Action Suggested:  >8%     CBG: Recent Labs  Lab 07/25/2022 1718  GLUCAP 188*    Review of Systems:   coma  Past Medical History:  She,  has a past medical history of Anxiety state, unspecified, Calculus of kidney, Cerebral aneurysm, nonruptured, Colon polyp, Complication of anesthesia, DEEP VENOUS THROMBOPHLEBITIS (08/15/2007), Diverticulosis of colon (without mention of hemorrhage), History of skin cancer, HTN (hypertension), Hyperlipidemia, Lumbago, Osteoarthrosis, unspecified whether generalized or localized, unspecified site, Permanent atrial fibrillation ([place]), Pulmonary emboli ([place]), Ruptured lumbar disc, Sleep apnea, and Unspecified hemorrhoids without mention of complication.   Surgical History:   Past Surgical History:  Procedure Laterality Date   ABDOMINAL HYSTERECTOMY     APPENDECTOMY     BLOCKED INTESTINE SURGERY     BREAST CYST EXCISION     ESOPHAGOGASTRODUODENOSCOPY (EGD) WITH PROPOFOL N/A 05/30/2019   Procedure: ESOPHAGOGASTRODUODENOSCOPY (EGD) WITH PROPOFOL;  Surgeon: Thornton Park, MD;  Location: [place];  Service: Gastroenterology;  Laterality: N/A;   HEMHORROIDECTOMY     JOINT REPLACEMENT  2004   RT TOTAL KNEE   KNEE ARTHROSCOPY     X2 L KNEE    KNEE SURGERY     NASAL SURGERY X2     TONSILLECTOMY AND ADENOIDECTOMY     TOTAL KNEE ARTHROPLASTY  09/03/2011   Procedure: TOTAL KNEE ARTHROPLASTY;  Surgeon: Gearlean Alf, MD;  Location: [facility];  Service: Orthopedics;  Laterality: Left;     Social History:   reports that she has never smoked. She has never used smokeless tobacco. She reports that she does not currently use alcohol. She reports that she does not use drugs.   Family History:  Her family history includes Cirrhosis in her brother; Coronary artery disease in her father; Stroke in her father. There is no history of Colon polyps, Kidney disease, or Diabetes.   Allergies Allergies  Allergen Reactions   Codeine Nausea Only   Penicillins Rash     Home Medications  Prior to Admission medications  Medication Sig Start Date End Date Taking? Authorizing Provider  apixaban (ELIQUIS) 5 MG TABS tablet TAKE 1 TABLET (5 MG TOTAL) BY MOUTH 2 (TWO) TIMES DAILY. Patient taking differently: Take 5 mg by mouth See admin instructions. TAKE 1 TABLET (5 MG TOTAL) BY MOUTH 2 (TWO) TIMES DAILY. 03/08/22   Hoyt Koch, MD  cyanocobalamin (VITAMIN B12) 1000 MCG tablet Take 1 tablet (1,000 mcg total) by mouth daily. 05/31/22   Wouk, Ailene Rud, MD  escitalopram (LEXAPRO) 5 MG tablet Take 1 tablet (5 mg total) by mouth daily. 03/10/22   Hoyt Koch, MD  gabapentin (NEURONTIN) 300 MG capsule Take 2 capsules (600 mg total) by mouth 3 (three) times daily. 03/08/22   Hoyt Koch, MD  hydrochlorothiazide (MICROZIDE) 12.5 MG capsule TAKE 1 CAPSULE(12.5 MG) BY MOUTH DAILY Patient taking differently: Take 12.5 mg by mouth daily. 03/08/22   Hoyt Koch, MD  metoprolol tartrate (LOPRESSOR) 25 MG tablet Take 1 tablet (25 mg total) by mouth 2 (two) times daily. 03/15/22   Regalado, Belkys A, MD  Multiple Vitamin (MULTIVITAMIN WITH MINERALS) TABS tablet Take 1 tablet by mouth daily.    [provider]   pravastatin (PRAVACHOL) 20 MG tablet TAKE 1 TABLET(20 MG) BY MOUTH DAILY AT 6 PM Patient taking differently: Take 20 mg by mouth every evening. 03/08/22   Hoyt Koch, MD  sodium chloride (OCEAN) 0.65 % SOLN nasal spray Place 1 spray into both nostrils as needed for congestion. Patient taking differently: Place 1 spray into both nostrils 2 (two) times daily as needed for congestion. 07/24/19   Hoyt Koch, MD     Critical care time: 31 mins

## 2022-08-07 NOTE — ED Notes (Signed)
Called and left HIPAA compliant voice mail for pt's son Samantha Bishop (see contact information in chart). Called and spoke with pt's daughter and son in law Andrey Cota in chart), verified pt information, and informed family that pt is here for treatment. Daughter will attempt to reach her brother who lives in [place] and will ask him to call.

## 2022-08-07 NOTE — ED Notes (Signed)
Next of Kin:  SonKip Mcinerny [id number] Daughter- [phone]

## 2022-08-07 NOTE — Progress Notes (Signed)
[facility] Progress Note Patient Name: ESTEFANITA KULCZYK DOB: Apr 01, 1925 MRN: [id number]   Date of Service  08/01/2022  HPI/Events of Note  89/F with cerebellar bleed. Not a candidate for surgical intervention. Intubated for airway protection, with stable VS at this time.   eICU Interventions  - Admitted to ICU - BP control with cleviprex - Given 3% saline.  - Given reversal for apixaban.  - Continue supportive measures    - Maintain on vent. Will follow ABG, adjust settings as needed    - Monitor electrolytes, correct abnormalities as warranted    - Maintain normoglycemia - Overall prognosis poor. Would need to discuss goals of care with family.         [place] 08/02/2022, 7:31 PM

## 2022-08-07 NOTE — ED Provider Notes (Signed)
  Physical Exam  Wt 79.8 kg   BMI 27.55 kg/m   Physical Exam  Procedures  Procedure Name: Intubation Date/Time: 07/21/2022 5:56 PM  Performed by: Kemper Durie, DOPre-anesthesia Checklist: Patient identified, Patient being monitored, Emergency Drugs available, Timeout performed and Suction available Oxygen Delivery Method: Non-rebreather mask Preoxygenation: Pre-oxygenation with 100% oxygen Induction Type: Rapid sequence Ventilation: Mask ventilation without difficulty Laryngoscope Size: Glidescope and 4 Grade View: Grade I Tube type: Non-subglottic suction tube Tube size: 7.5 mm Number of attempts: 1 Airway Equipment and Method: Rigid stylet and Video-laryngoscopy Placement Confirmation: ETT inserted through vocal cords under direct vision, CO2 detector and Breath sounds checked- equal and bilateral Secured at: 23 cm Tube secured with: ETT holder Dental Injury: Teeth and Oropharynx as per pre-operative assessment       ED Course / MDM    Medical Decision Making Amount and/or Complexity of Data Reviewed Labs: ordered. Radiology: ordered.  Risk Prescription drug management.         Leanord Asal K, DO 08/06/2022 1757

## 2022-08-07 NOTE — Progress Notes (Signed)
1 mg Atropine given at 2048 for symptomatic bradycardia with good patient response. OG tube having issues with coiling, successful on 3rd attempt with XR verification. Advanced per radiologist recommendation.   2215. Patient transported to CT3 with SWOT and RT without complication.   2240- 3% stopped for probable infiltration. The new USGPIV on the R anterior FA did not infiltrate, but the antecubital area on the right had a 18G PIV that was removed by me at the request of the IV team RN. Appeared to be some swelling at that [facility] site w/o phlebitis or tenderness. [place] notified and pharmacy called to ensure no reversal is necessary; instructed not to r/t small amount of infiltration. Ice applied and extremity elevated.  New IV team consult and IV on the L FA at 0015 with new ivWatch sensor placed.

## 2022-08-07 NOTE — Progress Notes (Signed)
An USGPIV (ultrasound guided PIV) has been placed for short-term vasopressor infusion. A correctly placed ivWatch must be used when administering Vasopressors. Should this treatment be needed beyond 72 hours, central line access should be obtained.  It will be the responsibility of the bedside nurse to follow best practice to prevent extravasations.   

## 2022-08-08 ENCOUNTER — Inpatient Hospital Stay (HOSPITAL_COMMUNITY): Payer: Medicare Other

## 2022-08-08 ENCOUNTER — Other Ambulatory Visit: Payer: Self-pay

## 2022-08-08 DIAGNOSIS — I614 Nontraumatic intracerebral hemorrhage in cerebellum: Secondary | ICD-10-CM | POA: Diagnosis not present

## 2022-08-08 DIAGNOSIS — I161 Hypertensive emergency: Secondary | ICD-10-CM | POA: Diagnosis not present

## 2022-08-08 DIAGNOSIS — I482 Chronic atrial fibrillation, unspecified: Secondary | ICD-10-CM

## 2022-08-08 DIAGNOSIS — I6389 Other cerebral infarction: Secondary | ICD-10-CM | POA: Diagnosis not present

## 2022-08-08 DIAGNOSIS — J9601 Acute respiratory failure with hypoxia: Secondary | ICD-10-CM | POA: Diagnosis not present

## 2022-08-08 DIAGNOSIS — G936 Cerebral edema: Secondary | ICD-10-CM | POA: Diagnosis not present

## 2022-08-08 LAB — ECHOCARDIOGRAM COMPLETE
AR max vel: 1.89 cm2
AV Area VTI: 1.94 cm2
AV Area mean vel: 1.85 cm2
AV Mean grad: 8.8 mmHg
AV Peak grad: 17.3 mmHg
Ao pk vel: 2.08 m/s
Est EF: >75
S' Lateral: 2.5 cm
Weight: 2888.91 oz

## 2022-08-08 LAB — CBC
HCT: 37.1 % (ref 36.0–46.0)
Hemoglobin: 11.9 g/dL — ABNORMAL LOW (ref 12.0–15.0)
MCH: 28.9 pg (ref 26.0–34.0)
MCHC: 32.1 g/dL (ref 30.0–36.0)
MCV: 90 fL (ref 80.0–100.0)
Platelets: 174 10*3/uL (ref 150–400)
RBC: 4.12 MIL/uL (ref 3.87–5.11)
RDW: 14.6 % (ref 11.5–15.5)
WBC: 12 10*3/uL — ABNORMAL HIGH (ref 4.0–10.5)
nRBC: 0 % (ref 0.0–0.2)

## 2022-08-08 LAB — COMPREHENSIVE METABOLIC PANEL
ALT: 18 U/L (ref 0–44)
AST: 23 U/L (ref 15–41)
Albumin: 3.6 g/dL (ref 3.5–5.0)
Alkaline Phosphatase: 40 U/L (ref 38–126)
Anion gap: 10 (ref 5–15)
BUN: 12 mg/dL (ref 8–23)
CO2: 20 mmol/L — ABNORMAL LOW (ref 22–32)
Calcium: 9 mg/dL (ref 8.9–10.3)
Chloride: 115 mmol/L — ABNORMAL HIGH (ref 98–111)
Creatinine, Ser: 0.57 mg/dL (ref 0.44–1.00)
GFR, Estimated: >60 mL/min (ref >60)
Glucose, Bld: 195 mg/dL — ABNORMAL HIGH (ref 70–99)
Potassium: 3.7 mmol/L (ref 3.5–5.1)
Sodium: 145 mmol/L (ref 135–145)
Total Bilirubin: 0.8 mg/dL (ref 0.3–1.2)
Total Protein: 7 g/dL (ref 6.5–8.1)

## 2022-08-08 LAB — TRIGLYCERIDES: Triglycerides: 143 mg/dL (ref <150)

## 2022-08-08 LAB — GLUCOSE, CAPILLARY
Glucose-Capillary: 148 mg/dL — ABNORMAL HIGH (ref 70–99)
Glucose-Capillary: 186 mg/dL — ABNORMAL HIGH (ref 70–99)
Glucose-Capillary: 193 mg/dL — ABNORMAL HIGH (ref 70–99)
Glucose-Capillary: 196 mg/dL — ABNORMAL HIGH (ref 70–99)

## 2022-08-08 LAB — SODIUM
Sodium: 136 mmol/L (ref 135–145)
Sodium: 149 mmol/L — ABNORMAL HIGH (ref 135–145)
Sodium: 152 mmol/L — ABNORMAL HIGH (ref 135–145)

## 2022-08-08 LAB — MAGNESIUM: Magnesium: 2.1 mg/dL (ref 1.7–2.4)

## 2022-08-08 MED ORDER — LABETALOL HCL 5 MG/ML IV SOLN: 20.0000 mg | INTRAVENOUS | Status: DC | PRN

## 2022-08-08 MED ORDER — INSULIN ASPART 100 UNIT/ML IJ SOLN
0.0000 [IU] | INTRAMUSCULAR | Status: DC
  Administered 2022-08-08: 1 [IU] via SUBCUTANEOUS
  Administered 2022-08-08 – 2022-08-09 (×6): 2 [IU] via SUBCUTANEOUS

## 2022-08-08 MED ORDER — CHLORHEXIDINE GLUCONATE CLOTH 2 % EX PADS
6.0000 | MEDICATED_PAD | Freq: Every day | CUTANEOUS | Status: DC
  Administered 2022-08-08: 6 via TOPICAL

## 2022-08-08 MED ORDER — IOHEXOL 350 MG/ML SOLN
75.0000 mL | Freq: Once | INTRAVENOUS | Status: AC | PRN
  Administered 2022-08-08: 75 mL via INTRAVENOUS

## 2022-08-08 NOTE — Progress Notes (Signed)
Systolic blood pressure less than 160 per Stroke MD.

## 2022-08-08 NOTE — Plan of Care (Signed)
I called patient another son Josph Macho and discussed with him over the phone.  He lives in [place], he can come over earliest around Thursday to Friday.  I updated him about patient current situation and he agreed with that patient would not want leave without independency, no call his wife, trach and PEG or nursing home.  I encouraged him to think about comfort care measures and discussed with his brother and sister.  He expressed understanding and appreciative.  Rosalin Hawking, MD PhD Stroke Neurology 08/08/2022 7:29 PM

## 2022-08-08 NOTE — Progress Notes (Signed)
  Echocardiogram 2D Echocardiogram has been performed.  Samantha Bishop 08/08/2022, 2:52 PM

## 2022-08-08 NOTE — Evaluation (Signed)
SLP Cancellation Note  Patient Details Name: Samantha Bishop MRN: [id number] DOB: 1924/08/27   Cancelled treatment:       Reason Eval/Treat Not Completed: Other (comment);Medical issues which prohibited therapy (pt has OG in place, not ready for evaluation, will continue efforts)  Kathleen Lime, MS [facility] SLP Acute Rehab Services Office [phone]  Macario Golds 08/08/2022, 7:44 AM

## 2022-08-08 NOTE — Progress Notes (Signed)
An USGPIV (ultrasound guided PIV) has been placed for short-term vasopressor infusion. A correctly placed ivWatch must be used when administering Vasopressors. Should this treatment be needed beyond 72 hours, central line access should be obtained.  It will be the responsibility of the bedside nurse to follow best practice to prevent extravasations.   

## 2022-08-08 NOTE — Progress Notes (Signed)
NAME:  Samantha Bishop, MRN:  [id number], DOB:  [birth year], LOS: 1 ADMISSION DATE:  07/30/2022, CONSULTATION DATE:  07/10/2022 REFERRING MD:  Neuro, CHIEF COMPLAINT:  found down   History of Present Illness:  This is a patient with a history of atrial fibrillation who was found down at her independent living SNF.  In the ER she was intubated for airway protection, imaging revealed a large cerebellar hemorrhage with mass effect.  She was deemed not to be an operative candidate and is admitted to the neurology service.  She has been started on 3% saline.  PCCM was consulted for help with medical management.    Pertinent  Medical History  Hypertension Atrial fibrillation on AC Distant history of PE DJD Osteoarthritis  Significant Hospital Events: Including procedures, antibiotic start and stop dates in addition to other pertinent events     Interim History / Subjective:  Remain intubated and sedated Was dyssynchronous with the vent, requiring propofol and fentanyl infusions Remained afebrile Was made DNR per family request overnight  Objective   Blood pressure (!) 147/59, pulse 73, temperature (!) 97 F (36.1 C), temperature source Oral, resp. rate 13, weight 81.9 kg, SpO2 98 %.    Vent Mode: PRVC FiO2 (%):  [40 %-60 %] 40 % Set Rate:  [15 bmp] 15 bmp Vt Set:  [460 mL-490 mL] 490 mL PEEP:  [5 cmH20] 5 cmH20 Plateau Pressure:  [11 cmH20-14 cmH20] 11 cmH20   Intake/Output Summary (Last 24 hours) at 08/08/2022 0757 Last data filed at 08/08/2022 0700 Gross per 24 hour  Intake 914.71 ml  Output 1845 ml  Net -930.29 ml   Filed Weights   07/11/2022 1700 08/08/22 0500  Weight: 79.8 kg 81.9 kg    Examination:   Physical exam: General: Crtitically ill-appearing female, orally intubated HEENT: [place]/AT, eyes anicteric.  ETT and OGT in place.  Hard neck collar in place Neuro: Eyes closed, does not open, not following commands, left pupil is surgical, right pupil is 2 mm, reactive to  light, withdrawing in the left upper and bilateral lower extremity flicker movement in the right upper extremity.  Positive cough and gag Chest: Coarse breath sounds, no wheezes or rhonchi Heart: Irregularly irregular, no murmurs or gallops Abdomen: Soft, nondistended, bowel sounds present Skin: No rash   Labs and images were reviewed  Resolved Hospital Problem list   N/A  Assessment & Plan:  Acute cerebellar hemorrhage with brain compression and coma Cerebral edema with upward tentorial herniation Mild hydrocephalus Hypertension Chronic atrial fibrillation Acute hypoxic respiratory failure in setting of above and inability protect airway Hypokalemia  Continue to watch every hour Neurology team is following Continue 3% hypertonic saline with serum sodium goal 150-155 Elevate head end of the bed >30 degrees Repeated CT head showed stability of hemorrhage Continue Cleviprex with SBP goal 130-150 Hold oral antihypertensive meds Patient remained in A-fib She received reversal of Coumadin Continue lung protective ventilation VAP prevention bundle in place PAD protocol with low-dose propofol Continue aggressive electrolyte supplement and monitor   Best Practice (right click and "Reselect all SmartList Selections" daily)   Diet/type: NPO DVT prophylaxis: not indicated GI prophylaxis: PPI Lines: N/A Foley:  Yes, and it is still needed Code Status:  full code Last date of multidisciplinary goals of care discussion [attempting to find family]  Labs   CBC: Recent Labs  Lab 07/15/2022 1719 07/12/2022 1725 08/05/2022 1834 08/08/22 0627  WBC 12.9*  --   --  12.0*  NEUTROABS 10.6*  --   --   --  HGB 12.3 13.3 11.9* 11.9*  HCT 38.9 39.0 35.0* 37.1  MCV 90.9  --   --  90.0  PLT 218  --   --  [id number]    Basic Metabolic Panel: Recent Labs  Lab 07/22/2022 1719 08/05/2022 1725 07/22/2022 1834 07/19/2022 1918 08/08/22 0011  NA 138 139 139 140 136  K 3.2* 3.1* 2.9*  --   --   CL 101  103  --   --   --   CO2 24  --   --   --   --   GLUCOSE 202* 199*  --   --   --   BUN 14 15  --   --   --   CREATININE 0.68 0.40*  --   --   --   CALCIUM 9.5  --   --   --   --    GFR: Estimated Creatinine Clearance: 46.3 mL/min (A) (by C-G formula based on SCr of 0.4 mg/dL (L)). Recent Labs  Lab 07/24/2022 1719 08/08/22 0627  WBC 12.9* 12.0*    Liver Function Tests: Recent Labs  Lab 07/18/2022 1719  AST 27  ALT 17  ALKPHOS 51  BILITOT 0.9  PROT 7.7  ALBUMIN 4.3   No results for input(s): "LIPASE", "AMYLASE" in the last 168 hours. No results for input(s): "AMMONIA" in the last 168 hours.  ABG    Component Value Date/Time   PHART 7.349 (L) 08/02/2022 1834   PCO2ART 43.4 07/15/2022 1834   PO2ART 65 (L) 07/26/2022 1834   HCO3 23.9 07/29/2022 1834   TCO2 25 07/27/2022 1834   ACIDBASEDEF 2.0 07/10/2022 1834   O2SAT 91 08/04/2022 1834     Coagulation Profile: Recent Labs  Lab 08/05/2022 1719  INR 1.2    Cardiac Enzymes: No results for input(s): "CKTOTAL", "CKMB", "CKMBINDEX", "TROPONINI" in the last 168 hours.  HbA1C: Hgb A1c MFr Bld  Date/Time Value Ref Range Status  06/27/2016 05:08 AM 6.3 (H) 4.8 - 5.6 % Final    Comment:    (NOTE)         Pre-diabetes: 5.7 - 6.4         Diabetes: >6.4         Glycemic control for adults with diabetes: <7.0   08/22/2012 12:39 PM 6.2 4.6 - 6.5 % Final    Comment:    Glycemic Control Guidelines for People with Diabetes:Non Diabetic:  <6%Goal of Therapy: <7%Additional Action Suggested:  >8%     CBG: Recent Labs  Lab 07/19/2022 1718  GLUCAP 188*   This patient is critically ill with multiple organ system failure which requires frequent high complexity decision making, assessment, support, evaluation, and titration of therapies. This was completed through the application of advanced monitoring technologies and extensive interpretation of multiple databases.  During this encounter critical care time was devoted to patient care  services described in this note for 37 minutes.    Jacky Kindle, MD [facility] See Amion for pager If no response to pager, please call [phone] until 7pm After 7pm, Please call E-link [phone]

## 2022-08-08 NOTE — Progress Notes (Signed)
PT Cancellation Note  Patient Details Name: Samantha Bishop MRN: [id number] DOB: Aug 23, 1924   Cancelled Treatment:    Reason Eval/Treat Not Completed: Active bedrest order  Noted pt on bedrest and with poor prognosis. Will follow for appropriateness for PT evaluation.   Samantha Bishop  Office [phone]  Rexanne Mano 08/08/2022, 7:26 AM

## 2022-08-08 NOTE — Progress Notes (Addendum)
STROKE TEAM PROGRESS NOTE   INTERVAL HISTORY No family at the bedside.  RN at the bedside Patient presented yesterday as a code stroke after being found down.  CT head with a large left cerebellar hemorrhage.  She is on Eliquis for A-fib, and was reversed with Andexxa CTA this morning with no vascular lesion or spot sign in left hemorrhage, 2 mm ACOM aneurysm Patient is intubated on 15 mcg of propofol, fentanyl was turned off.  She is on Cleviprex drip 12 mg.  She is also on 3% saline at 75 cc last sodium 145.  Will change systolic blood pressure goal to < 160 Will eventually need a C-spine MRI before removing c-collar.  Will need to touch base with son to determine goals of care  Vitals:   08/08/22 0645 08/08/22 0700 08/08/22 0748 08/08/22 0800  BP: 138/65 (!) 141/58 (!) 147/59 (!) 155/61  Pulse: 76 73  81  Resp: 16 13  15   Temp:    98.2 F (36.8 C)  TempSrc:    Oral  SpO2: 98% 98%  98%  Weight:       CBC:  Recent Labs  Lab 07/15/2022 1719 07/12/2022 1725 07/18/2022 1834 08/08/22 0627  WBC 12.9*  --   --  12.0*  NEUTROABS 10.6*  --   --   --   HGB 12.3   < > 11.9* 11.9*  HCT 38.9   < > 35.0* 37.1  MCV 90.9  --   --  90.0  PLT 218  --   --  174   < > = values in this interval not displayed.   Basic Metabolic Panel:  Recent Labs  Lab 07/26/2022 1719 07/29/2022 1725 07/16/2022 1834 08/08/2022 1918 08/08/22 0011  NA 138 139 139 140 136  K 3.2* 3.1* 2.9*  --   --   CL 101 103  --   --   --   CO2 24  --   --   --   --   GLUCOSE 202* 199*  --   --   --   BUN 14 15  --   --   --   CREATININE 0.68 0.40*  --   --   --   CALCIUM 9.5  --   --   --   --    Lipid Panel:  Recent Labs  Lab 08/08/22 0627  TRIG 143   HgbA1c: No results for input(s): "HGBA1C" in the last 168 hours. Urine Drug Screen: No results for input(s): "LABOPIA", "COCAINSCRNUR", "LABBENZ", "AMPHETMU", "THCU", "LABBARB" in the last 168 hours.  Alcohol Level  Recent Labs  Lab 07/13/2022 1719  ETH <10    IMAGING  past 24 hours CT ANGIO HEAD NECK W WO CM  Result Date: 08/08/2022 CLINICAL DATA:  Hemorrhagic stroke workup EXAM: CT ANGIOGRAPHY HEAD AND NECK TECHNIQUE: Multidetector CT imaging of the head and neck was performed using the standard protocol during bolus administration of intravenous contrast. Multiplanar CT image reconstructions and MIPs were obtained to evaluate the vascular anatomy. Carotid stenosis measurements (when applicable) are obtained utilizing NASCET criteria, using the distal internal carotid diameter as the denominator. RADIATION DOSE REDUCTION: This exam was performed according to the departmental dose-optimization program which includes automated exposure control, adjustment of the mA and/or kV according to patient size and/or use of iterative reconstruction technique. CONTRAST:  68mL OMNIPAQUE IOHEXOL 350 MG/ML SOLN COMPARISON:  Head CT from yesterday FINDINGS: CTA NECK FINDINGS Aortic arch: Atheromatous plaque.  Three vessel  branching. Right carotid system: Calcified plaque at the bifurcation. No stenosis or ulceration. Left carotid system: Partial retropharyngeal course. Atheromatous plaque which is primarily calcified and at the bifurcation without stenosis or ulceration Vertebral arteries: No proximal subclavian stenosis of note. The vertebral arteries are smoothly contoured and widely patent. Skeleton: No acute finding Other neck: No acute finding Upper chest: No acute finding Review of the MIP images confirms the above findings CTA HEAD FINDINGS Anterior circulation: Atheromatous plaque along the carotid siphons. No branch occlusion, beading, or aneurysm. 2 mm A-comm region aneurysm projecting superiorly. Posterior circulation: The vertebral and basilar arteries are diffusely patent. No branch occlusion, beading, aneurysm, or visible vascular malformation. No spot sign. Venous sinuses: Early opacification on this arterial study. No detected abnormality. Anatomic variants: None significant  Review of the MIP images confirms the above findings IMPRESSION: 1. No vascular lesion or spot sign seen underlying the left cerebellar hemorrhage. 2. Atherosclerosis without flow limiting stenosis or ulceration of major arteries in the head and neck. 3. 2 mm A-comm region aneurysm. Electronically Signed   By: Jorje Guild M.D.   On: 08/08/2022 05:57   CT HEAD WO CONTRAST  Result Date: 07/12/2022 CLINICAL DATA:  Hemorrhagic stroke EXAM: CT HEAD WITHOUT CONTRAST TECHNIQUE: Contiguous axial images were obtained from the base of the skull through the vertex without intravenous contrast. RADIATION DOSE REDUCTION: This exam was performed according to the departmental dose-optimization program which includes automated exposure control, adjustment of the mA and/or kV according to patient size and/or use of iterative reconstruction technique. COMPARISON:  07/27/2022 at 5:26 p.m. FINDINGS: Brain: Unchanged size of large left cerebellar intraparenchymal hematoma. Rightward midline shift of the cerebellar vermis with mass effect on the fourth ventricle and cerebral aqueduct is unchanged. Unchanged mild hydrocephalus of the lateral and third ventricles. There is periventricular hypoattenuation compatible with chronic microvascular disease. Old infarcts of the left PCA territory and right parietal lobe. Vascular: No hyperdense vessel or unexpected calcification. Skull: Normal. Negative for fracture or focal lesion. Sinuses/Orbits: No acute finding. Other: None. IMPRESSION: 1. Unchanged size of large left cerebellar intraparenchymal hematoma with rightward midline shift of the cerebellar vermis and mass effect on the fourth ventricle and cerebral aqueduct. 2. Unchanged mild hydrocephalus of the lateral and third ventricles. Electronically Signed   By: Ulyses Jarred M.D.   On: 07/19/2022 22:54   DG Abd Portable 1V  Result Date: 07/12/2022 CLINICAL DATA:  OG tube placement. EXAM: PORTABLE ABDOMEN - 1 VIEW COMPARISON:   08/01/2022. FINDINGS: The bowel gas pattern is normal. The side port of the enteric tube is above the level of the gastroesophageal junction and should be advanced 8 cm. Heart is enlarged. No radio-opaque calculi or other acute radiographic abnormality are seen. IMPRESSION: Side port of the enteric tube is above the level of the diaphragm and should be advanced approximately 8 cm. Electronically Signed   By: Brett Fairy M.D.   On: 07/19/2022 21:56   DG Abd Portable 1V  Result Date: 08/02/2022 CLINICAL DATA:  Enteric catheter placement EXAM: PORTABLE ABDOMEN - 1 VIEW COMPARISON:  07/29/2022 FINDINGS: Frontal view of the lower chest and upper abdomen demonstrates stable position of the enteric catheter tip projecting above the gastroesophageal junction. The side port is approximately 12 cm from the left hemidiaphragm. Cardiac silhouette remains enlarged. No airspace disease or effusion. Bowel gas pattern is unremarkable. IMPRESSION: 1. No significant change in positioning of the enteric catheter, with side port projecting approximately 12 cm above the left hemidiaphragm.  Electronically Signed   By: Randa Ngo M.D.   On: 07/09/2022 20:26   DG Abd 1 View  Result Date: 08/08/2022 CLINICAL DATA:  NG placement. EXAM: ABDOMEN - 1 VIEW COMPARISON:  Abdominal radiograph dated 10/22/2015. FINDINGS: Partially visualized enteric tube with side-port in the distal esophagus and tip in the region of the GE junction. Recommend further advancing by additional 15 cm. Several small radiopaque foci over the renal silhouette bilaterally, likely kidney stones. IMPRESSION: Enteric tube with tip in the region of the GE junction. Recommend further advancing by additional 15 cm. Electronically Signed   By: Anner Crete M.D.   On: 07/24/2022 19:49   CT CERVICAL SPINE WO CONTRAST  Result Date: 07/23/2022 CLINICAL DATA:  Neck trauma EXAM: CT CERVICAL SPINE WITHOUT CONTRAST TECHNIQUE: Multidetector CT imaging of the cervical  spine was performed without intravenous contrast. Multiplanar CT image reconstructions were also generated. RADIATION DOSE REDUCTION: This exam was performed according to the departmental dose-optimization program which includes automated exposure control, adjustment of the mA and/or kV according to patient size and/or use of iterative reconstruction technique. COMPARISON:  03/23/2021 FINDINGS: Alignment: No traumatic malalignment. Skull base and vertebrae: Chronic C2 fracture at the base of the dens which is corticated but not united. Fusion of the dens and the anterior arch of C1. No malalignment as noted above. No other regional fracture. Chronic posterior element fusion from C3 through C5. Chronic posterior element fusion at C7-T1. Soft tissues and spinal canal: No traumatic soft tissue finding. Endotracheal intubation. Disc levels: Ordinary spondylosis. No compressive bony stenosis of the canal or foramina. Upper chest: Intubated, as noted above. Other: None IMPRESSION: 1. No acute or traumatic finding. Chronic C2 fracture at the base of the dens which is corticated but not united. Fusion of the dens with the anterior arch of C1. No malalignment at this level. No change from the prior exam. 2. Chronic posterior element fusion from C3 through C5. Chronic posterior element fusion at C7-T1. Electronically Signed   By: Nelson Chimes M.D.   On: 08/02/2022 19:13   DG Chest Portable 1 View  Result Date: 07/27/2022 CLINICAL DATA:  Cerebellar hemorrhage status post intubation EXAM: PORTABLE CHEST 1 VIEW COMPARISON:  Chest radiograph dated 07/16/2022 FINDINGS: Lines/tubes: Endotracheal tube tip projects 2.1 cm above the carina. Chest: Lungs are clear without focal consolidation. Pleura: No pneumothorax or pleural effusion. Heart/mediastinum: Enlarged cardiomediastinal silhouette. Bones: No acute osseous abnormality. IMPRESSION: 1. Endotracheal tube tip projects 2.1 cm above the carina. 2. Cardiomegaly. Electronically  Signed   By: Darrin Nipper M.D.   On: 07/10/2022 18:53   CT HEAD CODE STROKE WO CONTRAST  Result Date: 08/04/2022 CLINICAL DATA:  Code stroke. Stroke suspected. Found down. Altered mental status EXAM: CT HEAD WITHOUT CONTRAST TECHNIQUE: Contiguous axial images were obtained from the base of the skull through the vertex without intravenous contrast. RADIATION DOSE REDUCTION: This exam was performed according to the departmental dose-optimization program which includes automated exposure control, adjustment of the mA and/or kV according to patient size and/or use of iterative reconstruction technique. COMPARISON:  CT Brain 05/29/22 FINDINGS: Brain: There is a large parenchymal hematoma centered in the left cerebellar hemisphere measuring 4.5 x 4.1 x 2.8 cm. There is mass effect on the brainstem and the fourth ventricle. There are also signs of ascending transtentorial herniation There is no evidence of hydrocephalus at the time of exam. Asymmetry centered in a region of infarcts seen on prior CT brain dated 05/29/2022. Redemonstrated are chronic infarcts  in the left occipital lobe, right parietal lobe. Vascular: No hyperdense vessel or unexpected calcification. Skull: Mild soft tissue thickening along the right frontal scalp. Sinuses/Orbits: No middle ear or mastoid effusion. Polypoid mucosal thickening left maxillary sinus. Bilateral lens replacement.2 orbits are otherwise unremarkable. Other: Severe degenerative changes of the craniocervical junction. IMPRESSION: 1. Large parenchymal hematoma centered in the left cerebellar hemisphere measuring 4.5 x 4.1 x 2.8 cm (25 cc). There are signs of ascending transtentorial herniation with mass effect on the fourth ventricle. No evidence of hydrocephalus at the time of exam. 2. Chronic infarcts in the left occipital lobe and right parietal lobe. 3. Mild soft tissue thickening along the right frontal scalp. Findings were paged to Dr. Luevenia Maxin on 07/12/2022 at 5:47 PM via [facility]  paging system. Electronically Signed   By: Marin Roberts M.D.   On: 07/21/2022 17:48    PHYSICAL EXAM  Temp:  [97 F (36.1 C)-98.2 F (36.8 C)] 98.2 F (36.8 C) (03/31 0800) Pulse Rate:  [48-86] 81 (03/31 0800) Resp:  [12-23] 15 (03/31 0800) BP: (96-218)/(44-90) 155/61 (03/31 0800) SpO2:  [94 %-100 %] 98 % (03/31 0800) FiO2 (%):  [40 %-60 %] 40 % (03/31 0748) Weight:  [79.8 kg-81.9 kg] 81.9 kg (03/31 0500)  General -critically ill female intubated on sedation, in no apparent distress Cardiovascular - irregularly irregular  Mental Status -  She is intubated on propofol.  Eyes are closed.  Does not open eyes to stimulation or follow commands.  PERRL, slight right gaze preference no blink to threat bilaterally, no corneal reflex bilaterally.  Cough/gag present, breathing over the ventilator unable to observe facial movements due to ET tube.  Responds to noxious stimuli in all 4 extremities.  Withdraws in all 4 extremities to noxious stimuli, bilateral uppers slightly weaker withdrawal.  Unable to assess coordination and gait   ASSESSMENT/PLAN Ms. Samantha Bishop is a 89 y.o. female with history of A-fib on Eliquis, hypertension, hyperlipidemia, history of PE, sleep apnea, brain aneurysm unruptured, anxiety and depression, who presented to [facility] after being found down at her independent living facility.  CT head revealed a large cerebellar bleed.  Eliquis was reversed with Andexxa neurosurgery was consulted and deemed not a candidate for decompressive crani  ICH: large left cerebellar bleed with cytotoxic edema, likely hypertensive in the setting of eliquis use Code Stroke CT head large left cerebellar hemorrhage with transtentorial herniation and mass effect on the fourth ventricle CTA head & neck no vascular lesion or spot sign in left hemorrhage, 2 mm ACOM aneurysm MRI obtain if family decides on aggressive care MRI C-spine if family decides on aggressive care 2D Echo pending LDL  pending HgbA1c pending VTE prophylaxis -SCDs Eliquis prior to admission, now on no antithrombotics due to [place].  Therapy recommendations: Pending Disposition: Pending, discussed with son at the bedside, he is leaning towards comfort care but will need to discuss with his brother and sister first.  Code status - DNR  Mild hydrocephalus cytotoxic edema On 3% saline @ 75 Na 136->145 Serial sodiums every 6 hours Goal sodium 1 50-1 55 Neurosurgery consulted no interventions at this time  Hypertensive Emergency  Home meds: HCTZ 12.5 mg, metoprolol 25 mg twice daily UnStable SBP goal<160 On Cleviprex drip at 12 mg wean as tolerated As needed labetalol to maintain BP goal Long-term BP goal normotensive  Chronic atrial fibrillation  On Eliquis PTA Currently rate controlled  metoprolol on hold EKG A-fib S/p Andexxa reversal   Acute hypoxic  respiratory failure due to above Management per CCM On Propofol drip Not extubation candidate now  Hx of stroke and aneurysm 06/2016 admitted for left hemianopia.  CT and MRI showed acute left PCA infarct, old right parietal and left cerebellar infarcts.  MRA head and neck showed left PCA distal occlusion, multiple aneurysms including 5 mm left ICA siphon, 2 mm left P-comm, 2 mm ACOM aneurysm.  EF 60 to 65%, LDL 90, A1c 6.3.  Patient Xarelto changed to Eliquis on discharge.  Also put on pravastatin 20 on discharge.  hyperlipidemia Home meds: Pravastatin 20 LDL 65, goal < 70 Consider restart statin at discharge  Dysphagia OG tube placed Consider starting tube feeds in the next day or so if family decides on aggressive care Speech therapy when able  Other Stroke Risk Factors Advanced Age >/= 74  Obstructive sleep apnea, on CPAP at home  Other Active Problems Leukocytosis-WBCs 12.0, afebrile.  Continue to monitor EBC in the morning Anemia-Hgb 11.9.  No active bleed.  Continue to monitor Multiple cerebral aneurysm as above  Hospital day #  1  Beulah Gandy DNP, ACNPC-AG  [facility]  ATTENDING NOTE: I reviewed above note and agree with the assessment and plan. Pt was seen and examined.   RN at bedside.  Patient intubated on low dose propofol, on hard C collar.  Telemetry showed irregularly irregular heart rate and rhythm.  Patient eyes closed, not following commands. With forced eye opening, eyes in near midline but mildly towards right position, not blinking to visual threat, doll's eyes not able to perform with hard C collar, not tracking, PERRL. Corneal reflex not present bilaterally, gag and cough present. Breathing over the vent.  Facial symmetry not able to test due to ET tube.  Tongue protrusion not cooperative. On pain stimulation, mild withdraw in all extremities, seems symmetrical and legs stronger than arms. DTR diminished and no babinski. Sensation, coordination and gait not tested.  Etiology for patient large cerebellar ICH likely due to hypertensive emergency in the setting of Eliquis use.  Neurosurgery consulted, not a candidate for hematoma evacuation.  Patient [place] highly likely will cause brainstem compression and hydrocephalus, poor prognosis.  Currently on 3% saline.  BP goal less than 160, on Cleviprex.    I was able to talk with her son at bedside later after around.  He stated that patient would not like to leave without independency, would not want trach and PEG and nursing home.  He is going to discuss with his brother and sister regarding [place] discussion and the possible comfort care measures.  For detailed assessment and plan, please refer to above/below as I have made changes wherever appropriate.   Rosalin Hawking, MD PhD Stroke Neurology 08/08/2022 7:22 PM  This patient is critically ill due to large cerebellar ICH, mild hydrocephalus, respiratory failure, hypertensive emergency and at significant risk of neurological worsening, death form obstructive hydrocephalus, brainstem compression, heart  failure, stroke. This patient's care requires constant monitoring of vital signs, hemodynamics, respiratory and cardiac monitoring, review of multiple databases, neurological assessment, discussion with family, other specialists and medical decision making of high complexity. I spent 45 minutes of neurocritical care time in the care of this patient. I had long discussion with son at bedside, updated pt current condition, treatment plan and potential prognosis, and answered all the questions.  He expressed understanding and appreciation.      To contact Stroke Continuity provider, please refer to [email or web address] After hours, contact General Neurology

## 2022-08-09 DIAGNOSIS — G911 Obstructive hydrocephalus: Secondary | ICD-10-CM | POA: Diagnosis not present

## 2022-08-09 DIAGNOSIS — I614 Nontraumatic intracerebral hemorrhage in cerebellum: Secondary | ICD-10-CM | POA: Diagnosis not present

## 2022-08-09 DIAGNOSIS — G936 Cerebral edema: Secondary | ICD-10-CM | POA: Diagnosis not present

## 2022-08-09 DIAGNOSIS — I619 Nontraumatic intracerebral hemorrhage, unspecified: Secondary | ICD-10-CM

## 2022-08-09 DIAGNOSIS — J9601 Acute respiratory failure with hypoxia: Secondary | ICD-10-CM | POA: Diagnosis not present

## 2022-08-09 LAB — CBC
HCT: 37.8 % (ref 36.0–46.0)
Hemoglobin: 11.7 g/dL — ABNORMAL LOW (ref 12.0–15.0)
MCH: 28.6 pg (ref 26.0–34.0)
MCHC: 31 g/dL (ref 30.0–36.0)
MCV: 92.4 fL (ref 80.0–100.0)
Platelets: 188 10*3/uL (ref 150–400)
RBC: 4.09 MIL/uL (ref 3.87–5.11)
RDW: 15.4 % (ref 11.5–15.5)
WBC: 15 10*3/uL — ABNORMAL HIGH (ref 4.0–10.5)
nRBC: 0 % (ref 0.0–0.2)

## 2022-08-09 LAB — BASIC METABOLIC PANEL
Anion gap: 10 (ref 5–15)
BUN: 14 mg/dL (ref 8–23)
CO2: 19 mmol/L — ABNORMAL LOW (ref 22–32)
Calcium: 8.9 mg/dL (ref 8.9–10.3)
Chloride: 126 mmol/L — ABNORMAL HIGH (ref 98–111)
Creatinine, Ser: 0.81 mg/dL (ref 0.44–1.00)
GFR, Estimated: >60 mL/min (ref >60)
Glucose, Bld: 154 mg/dL — ABNORMAL HIGH (ref 70–99)
Potassium: 2.6 mmol/L — CL (ref 3.5–5.1)
Sodium: 155 mmol/L — ABNORMAL HIGH (ref 135–145)

## 2022-08-09 LAB — HEMOGLOBIN A1C
Hgb A1c MFr Bld: 6.6 % — ABNORMAL HIGH (ref 4.8–5.6)
Mean Plasma Glucose: 143 mg/dL

## 2022-08-09 LAB — GLUCOSE, CAPILLARY
Glucose-Capillary: 165 mg/dL — ABNORMAL HIGH (ref 70–99)
Glucose-Capillary: 156 mg/dL — ABNORMAL HIGH (ref 70–99)
Glucose-Capillary: 155 mg/dL — ABNORMAL HIGH (ref 70–99)

## 2022-08-09 LAB — LIPID PANEL
Cholesterol: 147 mg/dL (ref 0–200)
HDL: 49 mg/dL (ref >40)
LDL Cholesterol: 70 mg/dL (ref 0–99)
Total CHOL/HDL Ratio: 3 RATIO
Triglycerides: 138 mg/dL (ref <150)
VLDL: 28 mg/dL (ref 0–40)

## 2022-08-09 LAB — SODIUM: Sodium: 158 mmol/L — ABNORMAL HIGH (ref 135–145)

## 2022-08-09 LAB — MAGNESIUM: Magnesium: 2.2 mg/dL (ref 1.7–2.4)

## 2022-08-09 MED ORDER — GLYCOPYRROLATE 0.2 MG/ML IJ SOLN: 0.2000 mg | INTRAMUSCULAR | Status: DC | PRN

## 2022-08-09 MED ORDER — POTASSIUM CHLORIDE 20 MEQ PO PACK
40.0000 meq | PACK | Freq: Once | ORAL | Status: AC
  Administered 2022-08-09: 40 meq
  Filled 2022-08-09: qty 2

## 2022-08-09 MED ORDER — MIDAZOLAM HCL 2 MG/2ML IJ SOLN
2.0000 mg | INTRAMUSCULAR | Status: DC | PRN
  Administered 2022-08-09: 4 mg via INTRAVENOUS
  Filled 2022-08-09: qty 4

## 2022-08-09 MED ORDER — ACETAMINOPHEN 650 MG RE SUPP: 650.0000 mg | Freq: Four times a day (QID) | RECTAL | Status: DC | PRN

## 2022-08-09 MED ORDER — SODIUM CHLORIDE 0.9 % IV SOLN: INTRAVENOUS | Status: DC | PRN

## 2022-08-09 MED ORDER — GLYCOPYRROLATE 1 MG PO TABS: 1.0000 mg | ORAL_TABLET | ORAL | Status: DC | PRN

## 2022-08-09 MED ORDER — MORPHINE 100MG IN NS 100ML (1MG/ML) PREMIX INFUSION
0.0000 mg/h | INTRAVENOUS | Status: DC
  Administered 2022-08-09: 20 mg/h via INTRAVENOUS
  Administered 2022-08-09: 5 mg/h via INTRAVENOUS
  Filled 2022-08-09 (×2): qty 100

## 2022-08-09 MED ORDER — POLYVINYL ALCOHOL 1.4 % OP SOLN: 1.0000 [drp] | Freq: Four times a day (QID) | OPHTHALMIC | Status: DC | PRN

## 2022-08-09 MED ORDER — ONDANSETRON HCL 4 MG/2ML IJ SOLN: 4.0000 mg | Freq: Four times a day (QID) | INTRAMUSCULAR | Status: DC | PRN

## 2022-08-09 MED ORDER — ACETAMINOPHEN 325 MG PO TABS: 650.0000 mg | ORAL_TABLET | Freq: Four times a day (QID) | ORAL | Status: DC | PRN

## 2022-08-09 MED ORDER — SODIUM CHLORIDE 0.9 % IV SOLN: INTRAVENOUS | Status: DC

## 2022-08-09 MED ORDER — ONDANSETRON 4 MG PO TBDP: 4.0000 mg | ORAL_TABLET | Freq: Four times a day (QID) | ORAL | Status: DC | PRN

## 2022-08-09 MED ORDER — POTASSIUM CHLORIDE 10 MEQ/100ML IV SOLN
10.0000 meq | INTRAVENOUS | Status: AC
  Administered 2022-08-09 (×3): 10 meq via INTRAVENOUS
  Filled 2022-08-09 (×3): qty 100

## 2022-08-09 MED ORDER — MORPHINE BOLUS VIA INFUSION: 5.0000 mg | INTRAVENOUS | Status: DC | PRN

## 2022-08-09 MED ORDER — GLYCOPYRROLATE 0.2 MG/ML IJ SOLN
0.2000 mg | INTRAMUSCULAR | Status: DC | PRN
  Administered 2022-08-09: 0.2 mg via INTRAVENOUS
  Filled 2022-08-09: qty 1

## 2022-08-09 DEATH — deceased

## 2022-09-08 NOTE — Progress Notes (Signed)
PT Cancellation/Discharge Note  Patient Details Name: Samantha Bishop MRN: [id number] DOB: 03-Feb-1925   Cancelled Treatment:    Reason Eval/Treat Not Completed: Medical issues which prohibited therapy  Continued poor prognosis with family considering comfort care. PT is signing off. Please re-order should it become appropriate.   [place]  Office [phone]    Rexanne Mano 08/16/2022, 7:45 AM

## 2022-09-08 NOTE — Death Summary Note (Signed)
DEATH SUMMARY   Patient Details  Name: Samantha Bishop MRN: [id number] DOB: 12-Apr-1925  Admission/Discharge Information   Admit Date:  01-Sep-2022  Date of Death: Date of Death: September 03, 2022  Time of Death: Time of Death: Sep 11, 2134  Length of Stay: 2  Referring Physician: Hoyt Koch, MD   Reason(s) for Hospitalization  Cerebellar ICH  Diagnoses  Preliminary cause of death:  ICH: large left cerebellar bleed with cytotoxic edema, likely hypertensive in the setting of eliquis use   Secondary Diagnoses (including complications and co-morbidities):  Mild hydrocephalus Cerebral edema Hypertensive emergency Chronic A-fib Respiratory failure History of stroke History of aneurysm Hyperlipidemia Dysphagia OSA on CPAP Leukocytosis   Brief Hospital Course (including significant findings, care, treatment, and services provided and events leading to death)  [name] is a 89 y.o. year old female with history of A-fib on Eliquis, hypertension, hyperlipidemia, history of PE, sleep apnea, brain aneurysm unruptured, anxiety and depression, who presented to [facility] after being found down at her independent living facility.  CT head revealed a large cerebellar bleed.  Eliquis was reversed with Andexxa neurosurgery was consulted and deemed not a candidate for decompressive crani   ICH: large left cerebellar bleed with cytotoxic edema, likely hypertensive in the setting of eliquis use Code Stroke CT head large left cerebellar hemorrhage with transtentorial herniation and mass effect on the fourth ventricle CTA head & neck no vascular lesion or spot sign in left hemorrhage, 2 mm ACOM aneurysm 2D Echo EF > 75% LDL 70 HgbA1c 6.6 VTE prophylaxis -SCDs Eliquis prior to admission, now on no antithrombotics due to [place].  Disposition: Family has decided on comfort care measures.  Patient expired 2134-09-11 on 09-03-22   Mild hydrocephalus cytotoxic edema On 3% saline @ 75 -> off for  comfort Na 136->145->155 Neurosurgery consulted no interventions   Hypertensive Emergency  Home meds: HCTZ 12.5 mg, metoprolol 25 mg twice daily Stable Off Cleviprex drip for comfort   Chronic atrial fibrillation  On Eliquis PTA Currently rate controlled  metoprolol on hold EKG A-fib S/p Andexxa reversal    Acute hypoxic respiratory failure due to above Management per CCM On Propofol drip Compassionate extubation 09/03/2022 for comfort   Hx of stroke and aneurysm 06/2016 admitted for left hemianopia.  CT and MRI showed acute left PCA infarct, old right parietal and left cerebellar infarcts.  MRA head and neck showed left PCA distal occlusion, multiple aneurysms including 5 mm left ICA siphon, 2 mm left P-comm, 2 mm ACOM aneurysm.  EF 60 to 65%, LDL 90, A1c 6.3.  Patient Xarelto changed to Eliquis on discharge.  Also put on pravastatin 20 on discharge.   hyperlipidemia Home meds: Pravastatin 20 LDL 70   Dysphagia OG tube will be removed for comfort   Other Stroke Risk Factors Advanced Age >/= 87  Obstructive sleep apnea, on CPAP at home   Other Active Problems Leukocytosis-WBCs 12.0->15.0, Tmax 101.7 Anemia-Hgb 11.9-> 11.7 Multiple cerebral aneurysm as above    Pertinent Labs and Studies  Significant Diagnostic Studies ECHOCARDIOGRAM COMPLETE  Result Date: 08/08/2022    ECHOCARDIOGRAM REPORT   Patient Name:   Samantha Bishop Date of Exam: 08/08/2022 Medical Rec #:  [id number]       Height:       67.0 in Accession #:    [id number]      Weight:       180.6 lb Date of Birth:  1925/03/27      BSA:  1.936 m Patient Age:    89 years        BP:           150/49 mmHg Patient Gender: F               HR:           92 bpm. Exam Location:  Inpatient Procedure: 2D Echo, Color Doppler and Cardiac Doppler Indications:    stroke  History:        Patient has prior history of Echocardiogram examinations, most                 recent 06/27/2016. Arrythmias:Atrial Fibrillation; Risk                  Factors:Hypertension and Dyslipidemia.  Sonographer:    Johny Chess RDCS Referring Phys: [id number] [place] E [place]  Sonographer Comments: Echo performed with patient supine and on artificial respirator. IMPRESSIONS  1. Left ventricular ejection fraction, by estimation, is >75%. The left ventricle has hyperdynamic function. The left ventricle has no regional wall motion abnormalities. There is mild left ventricular hypertrophy. Left ventricular diastolic parameters are indeterminate. Increased flow in the LVOT likely from hyperdynamic LV.  2. Right ventricular systolic function is normal. The right ventricular size is normal.  3. Left atrial size was moderate to severely dilated.  4. Right atrial size was mild to moderately dilated.  5. Moderate pericardial effusion. The pericardial effusion is lateral to the left ventricle and anterior to the right ventricle. There is no evidence of cardiac tamponade.  6. The mitral valve is grossly normal. No evidence of mitral valve regurgitation. No evidence of mitral stenosis.  7. The aortic valve is functionally bicuspid. Aortic valve regurgitation is not visualized. Mild aortic valve stenosis. Aortic valve area, by VTI measures 1.94 cm. Aortic valve mean gradient measures 8.8 mmHg. Aortic valve Vmax measures 2.08 m/s. Comparison(s): No significant change from prior study. Moderate pericardial effusion adjacent to LV and RV; Mild AS is new. FINDINGS  Left Ventricle: Left ventricular ejection fraction, by estimation, is >75%. The left ventricle has hyperdynamic function. The left ventricle has no regional wall motion abnormalities. The left ventricular internal cavity size was normal in size. There is mild left ventricular hypertrophy. Left ventricular diastolic parameters are indeterminate. Right Ventricle: The right ventricular size is normal. No increase in right ventricular wall thickness. Right ventricular systolic function is normal. Left Atrium: Left  atrial size was moderately dilated. Right Atrium: Right atrial size was mild to moderately dilated. Pericardium: A moderately sized pericardial effusion is present. The pericardial effusion is lateral to the left ventricle and anterior to the right ventricle. There is no evidence of cardiac tamponade. Mitral Valve: The mitral valve is grossly normal. No evidence of mitral valve regurgitation. No evidence of mitral valve stenosis. Tricuspid Valve: The tricuspid valve is grossly normal. Tricuspid valve regurgitation is mild . No evidence of tricuspid stenosis. Aortic Valve: The aortic valve is normal in structure. Aortic valve regurgitation is not visualized. Mild aortic stenosis is present. Aortic valve mean gradient measures 8.8 mmHg. Aortic valve peak gradient measures 17.3 mmHg. Aortic valve area, by VTI measures 1.94 cm. Pulmonic Valve: The pulmonic valve was not well visualized. Pulmonic valve regurgitation is not visualized. No evidence of pulmonic stenosis. Aorta: The aortic root and ascending aorta are structurally normal, with no evidence of dilitation. Venous: IVC assessment for right atrial pressure unable to be performed due to mechanical ventilation. IAS/Shunts: No atrial  level shunt detected by color flow Doppler.  LEFT VENTRICLE PLAX 2D LVIDd:         4.30 cm LVIDs:         2.50 cm LV PW:         1.10 cm LV IVS:        1.20 cm LVOT diam:     1.80 cm LV SV:         63 LV SV Index:   32 LVOT Area:     2.54 cm  RIGHT VENTRICLE             IVC RV S prime:     15.10 cm/s  IVC diam: 2.70 cm TAPSE (M-mode): 2.1 cm LEFT ATRIUM             Index        RIGHT ATRIUM           Index LA diam:        4.50 cm 2.32 cm/m   RA Area:     22.80 cm LA Vol (A2C):   92.9 ml 47.98 ml/m  RA Volume:   66.60 ml  34.40 ml/m LA Vol (A4C):   95.3 ml 49.22 ml/m LA Biplane Vol: 95.2 ml 49.17 ml/m  AORTIC VALVE AV Area (Vmax):    1.89 cm AV Area (Vmean):   1.85 cm AV Area (VTI):     1.94 cm AV Vmax:           208.25 cm/s  AV Vmean:          136.000 cm/s AV VTI:            0.324 m AV Peak Grad:      17.3 mmHg AV Mean Grad:      8.8 mmHg LVOT Vmax:         155.00 cm/s LVOT Vmean:        99.000 cm/s LVOT VTI:          0.247 m LVOT/AV VTI ratio: 0.76  AORTA Ao Root diam: 3.00 cm Ao Asc diam:  2.90 cm TRICUSPID VALVE TR Peak grad:   37.2 mmHg TR Vmax:        305.00 cm/s  SHUNTS Systemic VTI:  0.25 m Systemic Diam: 1.80 cm Vishnu Priya Mallipeddi Electronically signed by Lorelee Cover Mallipeddi Signature Date/Time: 08/08/2022/3:23:32 PM    Final    CT ANGIO HEAD NECK W WO CM  Result Date: 08/08/2022 CLINICAL DATA:  Hemorrhagic stroke workup EXAM: CT ANGIOGRAPHY HEAD AND NECK TECHNIQUE: Multidetector CT imaging of the head and neck was performed using the standard protocol during bolus administration of intravenous contrast. Multiplanar CT image reconstructions and MIPs were obtained to evaluate the vascular anatomy. Carotid stenosis measurements (when applicable) are obtained utilizing NASCET criteria, using the distal internal carotid diameter as the denominator. RADIATION DOSE REDUCTION: This exam was performed according to the departmental dose-optimization program which includes automated exposure control, adjustment of the mA and/or kV according to patient size and/or use of iterative reconstruction technique. CONTRAST:  80mL OMNIPAQUE IOHEXOL 350 MG/ML SOLN COMPARISON:  Head CT from yesterday FINDINGS: CTA NECK FINDINGS Aortic arch: Atheromatous plaque.  Three vessel branching. Right carotid system: Calcified plaque at the bifurcation. No stenosis or ulceration. Left carotid system: Partial retropharyngeal course. Atheromatous plaque which is primarily calcified and at the bifurcation without stenosis or ulceration Vertebral arteries: No proximal subclavian stenosis of note. The vertebral arteries are smoothly contoured and widely patent. Skeleton: No acute finding  Other neck: No acute finding Upper chest: No acute finding Review  of the MIP images confirms the above findings CTA HEAD FINDINGS Anterior circulation: Atheromatous plaque along the carotid siphons. No branch occlusion, beading, or aneurysm. 2 mm A-comm region aneurysm projecting superiorly. Posterior circulation: The vertebral and basilar arteries are diffusely patent. No branch occlusion, beading, aneurysm, or visible vascular malformation. No spot sign. Venous sinuses: Early opacification on this arterial study. No detected abnormality. Anatomic variants: None significant Review of the MIP images confirms the above findings IMPRESSION: 1. No vascular lesion or spot sign seen underlying the left cerebellar hemorrhage. 2. Atherosclerosis without flow limiting stenosis or ulceration of major arteries in the head and neck. 3. 2 mm A-comm region aneurysm. Electronically Signed   By: Jorje Guild M.D.   On: 08/08/2022 05:57   CT HEAD WO CONTRAST  Result Date: 08/02/2022 CLINICAL DATA:  Hemorrhagic stroke EXAM: CT HEAD WITHOUT CONTRAST TECHNIQUE: Contiguous axial images were obtained from the base of the skull through the vertex without intravenous contrast. RADIATION DOSE REDUCTION: This exam was performed according to the departmental dose-optimization program which includes automated exposure control, adjustment of the mA and/or kV according to patient size and/or use of iterative reconstruction technique. COMPARISON:  07/17/2022 at 5:26 p.m. FINDINGS: Brain: Unchanged size of large left cerebellar intraparenchymal hematoma. Rightward midline shift of the cerebellar vermis with mass effect on the fourth ventricle and cerebral aqueduct is unchanged. Unchanged mild hydrocephalus of the lateral and third ventricles. There is periventricular hypoattenuation compatible with chronic microvascular disease. Old infarcts of the left PCA territory and right parietal lobe. Vascular: No hyperdense vessel or unexpected calcification. Skull: Normal. Negative for fracture or focal lesion.  Sinuses/Orbits: No acute finding. Other: None. IMPRESSION: 1. Unchanged size of large left cerebellar intraparenchymal hematoma with rightward midline shift of the cerebellar vermis and mass effect on the fourth ventricle and cerebral aqueduct. 2. Unchanged mild hydrocephalus of the lateral and third ventricles. Electronically Signed   By: Ulyses Jarred M.D.   On: 08/06/2022 22:54   DG Abd Portable 1V  Result Date: 07/16/2022 CLINICAL DATA:  OG tube placement. EXAM: PORTABLE ABDOMEN - 1 VIEW COMPARISON:  07/30/2022. FINDINGS: The bowel gas pattern is normal. The side port of the enteric tube is above the level of the gastroesophageal junction and should be advanced 8 cm. Heart is enlarged. No radio-opaque calculi or other acute radiographic abnormality are seen. IMPRESSION: Side port of the enteric tube is above the level of the diaphragm and should be advanced approximately 8 cm. Electronically Signed   By: Brett Fairy M.D.   On: 07/20/2022 21:56   DG Abd Portable 1V  Result Date: 07/11/2022 CLINICAL DATA:  Enteric catheter placement EXAM: PORTABLE ABDOMEN - 1 VIEW COMPARISON:  08/08/2022 FINDINGS: Frontal view of the lower chest and upper abdomen demonstrates stable position of the enteric catheter tip projecting above the gastroesophageal junction. The side port is approximately 12 cm from the left hemidiaphragm. Cardiac silhouette remains enlarged. No airspace disease or effusion. Bowel gas pattern is unremarkable. IMPRESSION: 1. No significant change in positioning of the enteric catheter, with side port projecting approximately 12 cm above the left hemidiaphragm. Electronically Signed   By: Randa Ngo M.D.   On: 07/17/2022 20:26   DG Abd 1 View  Result Date: 08/06/2022 CLINICAL DATA:  NG placement. EXAM: ABDOMEN - 1 VIEW COMPARISON:  Abdominal radiograph dated 10/22/2015. FINDINGS: Partially visualized enteric tube with side-port in the distal esophagus and tip in  the region of the GE  junction. Recommend further advancing by additional 15 cm. Several small radiopaque foci over the renal silhouette bilaterally, likely kidney stones. IMPRESSION: Enteric tube with tip in the region of the GE junction. Recommend further advancing by additional 15 cm. Electronically Signed   By: Anner Crete M.D.   On: 07/31/2022 19:49   CT CERVICAL SPINE WO CONTRAST  Result Date: 07/19/2022 CLINICAL DATA:  Neck trauma EXAM: CT CERVICAL SPINE WITHOUT CONTRAST TECHNIQUE: Multidetector CT imaging of the cervical spine was performed without intravenous contrast. Multiplanar CT image reconstructions were also generated. RADIATION DOSE REDUCTION: This exam was performed according to the departmental dose-optimization program which includes automated exposure control, adjustment of the mA and/or kV according to patient size and/or use of iterative reconstruction technique. COMPARISON:  03/23/2021 FINDINGS: Alignment: No traumatic malalignment. Skull base and vertebrae: Chronic C2 fracture at the base of the dens which is corticated but not united. Fusion of the dens and the anterior arch of C1. No malalignment as noted above. No other regional fracture. Chronic posterior element fusion from C3 through C5. Chronic posterior element fusion at C7-T1. Soft tissues and spinal canal: No traumatic soft tissue finding. Endotracheal intubation. Disc levels: Ordinary spondylosis. No compressive bony stenosis of the canal or foramina. Upper chest: Intubated, as noted above. Other: None IMPRESSION: 1. No acute or traumatic finding. Chronic C2 fracture at the base of the dens which is corticated but not united. Fusion of the dens with the anterior arch of C1. No malalignment at this level. No change from the prior exam. 2. Chronic posterior element fusion from C3 through C5. Chronic posterior element fusion at C7-T1. Electronically Signed   By: Nelson Chimes M.D.   On: 07/22/2022 19:13   DG Chest Portable 1 View  Result Date:  07/23/2022 CLINICAL DATA:  Cerebellar hemorrhage status post intubation EXAM: PORTABLE CHEST 1 VIEW COMPARISON:  Chest radiograph dated 07/16/2022 FINDINGS: Lines/tubes: Endotracheal tube tip projects 2.1 cm above the carina. Chest: Lungs are clear without focal consolidation. Pleura: No pneumothorax or pleural effusion. Heart/mediastinum: Enlarged cardiomediastinal silhouette. Bones: No acute osseous abnormality. IMPRESSION: 1. Endotracheal tube tip projects 2.1 cm above the carina. 2. Cardiomegaly. Electronically Signed   By: Darrin Nipper M.D.   On: 07/12/2022 18:53   CT HEAD CODE STROKE WO CONTRAST  Result Date: 08/01/2022 CLINICAL DATA:  Code stroke. Stroke suspected. Found down. Altered mental status EXAM: CT HEAD WITHOUT CONTRAST TECHNIQUE: Contiguous axial images were obtained from the base of the skull through the vertex without intravenous contrast. RADIATION DOSE REDUCTION: This exam was performed according to the departmental dose-optimization program which includes automated exposure control, adjustment of the mA and/or kV according to patient size and/or use of iterative reconstruction technique. COMPARISON:  CT Brain 05/29/22 FINDINGS: Brain: There is a large parenchymal hematoma centered in the left cerebellar hemisphere measuring 4.5 x 4.1 x 2.8 cm. There is mass effect on the brainstem and the fourth ventricle. There are also signs of ascending transtentorial herniation There is no evidence of hydrocephalus at the time of exam. Asymmetry centered in a region of infarcts seen on prior CT brain dated 05/29/2022. Redemonstrated are chronic infarcts in the left occipital lobe, right parietal lobe. Vascular: No hyperdense vessel or unexpected calcification. Skull: Mild soft tissue thickening along the right frontal scalp. Sinuses/Orbits: No middle ear or mastoid effusion. Polypoid mucosal thickening left maxillary sinus. Bilateral lens replacement.2 orbits are otherwise unremarkable. Other: Severe  degenerative changes of the craniocervical junction.  IMPRESSION: 1. Large parenchymal hematoma centered in the left cerebellar hemisphere measuring 4.5 x 4.1 x 2.8 cm (25 cc). There are signs of ascending transtentorial herniation with mass effect on the fourth ventricle. No evidence of hydrocephalus at the time of exam. 2. Chronic infarcts in the left occipital lobe and right parietal lobe. 3. Mild soft tissue thickening along the right frontal scalp. Findings were paged to Dr. Luevenia Maxin on 07/15/2022 at 5:47 PM via [facility] paging system. Electronically Signed   By: Marin Roberts M.D.   On: 07/28/2022 17:48   DG Chest Portable 1 View  Result Date: 07/16/2022 CLINICAL DATA:  Fatigue and palpitations. EXAM: PORTABLE CHEST 1 VIEW COMPARISON:  05/29/2022 FINDINGS: Cardiomegaly again noted. There is no evidence of focal airspace disease, pulmonary edema, suspicious pulmonary nodule/mass, pleural effusion, or pneumothorax. No acute bony abnormalities are identified. IMPRESSION: No active disease. Electronically Signed   By: Margarette Canada M.D.   On: 07/16/2022 10:41    Microbiology Recent Results (from the past 240 hour(s))  MRSA Next Gen by PCR, Nasal     Status: None   Collection Time: 07/09/2022  7:47 PM   Specimen: Nasal Mucosa; Nasal Swab  Result Value Ref Range Status   MRSA by PCR Next Gen NOT DETECTED NOT DETECTED Final    Comment: (NOTE) The GeneXpert MRSA Assay (FDA approved for NASAL specimens only), is one component of a comprehensive MRSA colonization surveillance program. It is not intended to diagnose MRSA infection nor to guide or monitor treatment for MRSA infections. Test performance is not FDA approved in patients less than 4 years old. Performed at [facility], [place] [address], [place], [place] [address]     Lab Basic Metabolic Panel: Recent Labs  Lab 07/20/2022 1719 07/26/2022 1725 08/03/2022 1834 07/20/2022 1918 08/08/22 0836 08/08/22 1200 08/08/22 1750 09/03/2022 0102  08/11/2022 0606  NA 138 139 139   < > 145 149* 152* 158* 155*  K 3.2* 3.1* 2.9*  --  3.7  --   --   --  2.6*  CL 101 103  --   --  115*  --   --   --  126*  CO2 24  --   --   --  20*  --   --   --  19*  GLUCOSE 202* 199*  --   --  195*  --   --   --  154*  BUN 14 15  --   --  12  --   --   --  14  CREATININE 0.68 0.40*  --   --  0.57  --   --   --  0.81  CALCIUM 9.5  --   --   --  9.0  --   --   --  8.9  MG  --   --   --   --  2.1  --   --   --  2.2   < > = values in this interval not displayed.   Liver Function Tests: Recent Labs  Lab 07/20/2022 1719 08/08/22 0836  AST 27 23  ALT 17 18  ALKPHOS 51 40  BILITOT 0.9 0.8  PROT 7.7 7.0  ALBUMIN 4.3 3.6   No results for input(s): "LIPASE", "AMYLASE" in the last 168 hours. No results for input(s): "AMMONIA" in the last 168 hours. CBC: Recent Labs  Lab 07/28/2022 1719 07/20/2022 1725 07/19/2022 1834 08/08/22 0627 08/31/2022 0740  WBC 12.9*  --   --  12.0* 15.0*  NEUTROABS 10.6*  --   --   --   --   HGB 12.3 13.3 11.9* 11.9* 11.7*  HCT 38.9 39.0 35.0* 37.1 37.8  MCV 90.9  --   --  90.0 92.4  PLT 218  --   --  174 188   Cardiac Enzymes: No results for input(s): "CKTOTAL", "CKMB", "CKMBINDEX", "TROPONINI" in the last 168 hours. Sepsis Labs: Recent Labs  Lab 07/11/2022 1719 08/08/22 0627 08/11/2022 0740  WBC 12.9* 12.0* 15.0*    Procedures/Operations  Intubation and extubation   Rosalin Hawking 08/10/2022, 8:57 AM

## 2022-09-08 NOTE — Progress Notes (Addendum)
Pharmacy notified of possible infiltration of IV Potassium into right upper extremity (posterior forearm). IV removed, right upper extremity marked and elevated, and ice applied.  Normajean Baxter, RN

## 2022-09-08 NOTE — Progress Notes (Signed)
OT Cancellation Note  Patient Details Name: Samantha Bishop MRN: [id number] DOB: 12/18/1924   Cancelled Treatment:    Reason Eval/Treat Not Completed: Patient not medically ready  Continued poor prognosis with family considering comfort care. OT is signing off. Please re-order should it become appropriate.   Elliot Cousin 08/18/2022, 11:03 AM

## 2022-09-08 NOTE — Progress Notes (Signed)
STROKE TEAM PROGRESS NOTE   INTERVAL HISTORY No family at the bedside.  Patient still intubated, on sedation, not responsive, not follow commands.  Slight movement in all extremities on pain stimulation.  Discussed with son [name] over the phone, he stated that he has talked with his brother Josph Macho and sister, they all in agreement for comfort care measures.  Will start comfort care medication and extubation.  Vitals:   09/02/2022 1100 08/14/2022 1115 08/17/2022 1118 09/07/2022 1152  BP: (!) 148/54 (!) 141/53 (!) 141/53   Pulse: 91 98 94   Resp: 16 18 17    Temp:    99.9 F (37.7 C)  TempSrc:    Oral  SpO2: 99% 99% 100%   Weight:       CBC:  Recent Labs  Lab 07/30/2022 1719 07/15/2022 1725 08/08/22 0627 08/24/2022 0740  WBC 12.9*  --  12.0* 15.0*  NEUTROABS 10.6*  --   --   --   HGB 12.3   < > 11.9* 11.7*  HCT 38.9   < > 37.1 37.8  MCV 90.9  --  90.0 92.4  PLT 218  --  174 188   < > = values in this interval not displayed.   Basic Metabolic Panel:  Recent Labs  Lab 08/08/22 0836 08/08/22 1200 08/31/2022 0102 08/25/2022 0606  NA 145   < > 158* 155*  K 3.7  --   --  2.6*  CL 115*  --   --  126*  CO2 20*  --   --  19*  GLUCOSE 195*  --   --  154*  BUN 12  --   --  14  CREATININE 0.57  --   --  0.81  CALCIUM 9.0  --   --  8.9  MG 2.1  --   --  2.2   < > = values in this interval not displayed.   Lipid Panel:  Recent Labs  Lab 08/19/2022 0102  CHOL 147  TRIG 138  HDL 49  CHOLHDL 3.0  VLDL 28  LDLCALC 70   HgbA1c:  Recent Labs  Lab 08/08/22 0627  HGBA1C 6.6*   Urine Drug Screen: No results for input(s): "LABOPIA", "COCAINSCRNUR", "LABBENZ", "AMPHETMU", "THCU", "LABBARB" in the last 168 hours.  Alcohol Level  Recent Labs  Lab 07/30/2022 1719  ETH <10    IMAGING past 24 hours ECHOCARDIOGRAM COMPLETE  Result Date: 08/08/2022    ECHOCARDIOGRAM REPORT   Patient Name:   Samantha Bishop Date of Exam: 08/08/2022 Medical Rec #:  [id number]       Height:       67.0 in Accession #:     [id number]      Weight:       180.6 lb Date of Birth:  25-Mar-1925      BSA:          1.936 m Patient Age:    89 years        BP:           150/49 mmHg Patient Gender: F               HR:           92 bpm. Exam Location:  Inpatient Procedure: 2D Echo, Color Doppler and Cardiac Doppler Indications:    stroke  History:        Patient has prior history of Echocardiogram examinations, most  recent 06/27/2016. Arrythmias:Atrial Fibrillation; Risk                 Factors:Hypertension and Dyslipidemia.  Sonographer:    Johny Chess RDCS Referring Phys: [id number] [place]  Sonographer Comments: Echo performed with patient supine and on artificial respirator. IMPRESSIONS  1. Left ventricular ejection fraction, by estimation, is >75%. The left ventricle has hyperdynamic function. The left ventricle has no regional wall motion abnormalities. There is mild left ventricular hypertrophy. Left ventricular diastolic parameters are indeterminate. Increased flow in the LVOT likely from hyperdynamic LV.  2. Right ventricular systolic function is normal. The right ventricular size is normal.  3. Left atrial size was moderate to severely dilated.  4. Right atrial size was mild to moderately dilated.  5. Moderate pericardial effusion. The pericardial effusion is lateral to the left ventricle and anterior to the right ventricle. There is no evidence of cardiac tamponade.  6. The mitral valve is grossly normal. No evidence of mitral valve regurgitation. No evidence of mitral stenosis.  7. The aortic valve is functionally bicuspid. Aortic valve regurgitation is not visualized. Mild aortic valve stenosis. Aortic valve area, by VTI measures 1.94 cm. Aortic valve mean gradient measures 8.8 mmHg. Aortic valve Vmax measures 2.08 m/s. Comparison(s): No significant change from prior study. Moderate pericardial effusion adjacent to LV and RV; Mild AS is new. FINDINGS  Left Ventricle: Left ventricular ejection  fraction, by estimation, is >75%. The left ventricle has hyperdynamic function. The left ventricle has no regional wall motion abnormalities. The left ventricular internal cavity size was normal in size. There is mild left ventricular hypertrophy. Left ventricular diastolic parameters are indeterminate. Right Ventricle: The right ventricular size is normal. No increase in right ventricular wall thickness. Right ventricular systolic function is normal. Left Atrium: Left atrial size was moderately dilated. Right Atrium: Right atrial size was mild to moderately dilated. Pericardium: A moderately sized pericardial effusion is present. The pericardial effusion is lateral to the left ventricle and anterior to the right ventricle. There is no evidence of cardiac tamponade. Mitral Valve: The mitral valve is grossly normal. No evidence of mitral valve regurgitation. No evidence of mitral valve stenosis. Tricuspid Valve: The tricuspid valve is grossly normal. Tricuspid valve regurgitation is mild . No evidence of tricuspid stenosis. Aortic Valve: The aortic valve is normal in structure. Aortic valve regurgitation is not visualized. Mild aortic stenosis is present. Aortic valve mean gradient measures 8.8 mmHg. Aortic valve peak gradient measures 17.3 mmHg. Aortic valve area, by VTI measures 1.94 cm. Pulmonic Valve: The pulmonic valve was not well visualized. Pulmonic valve regurgitation is not visualized. No evidence of pulmonic stenosis. Aorta: The aortic root and ascending aorta are structurally normal, with no evidence of dilitation. Venous: IVC assessment for right atrial pressure unable to be performed due to mechanical ventilation. IAS/Shunts: No atrial level shunt detected by color flow Doppler.  LEFT VENTRICLE PLAX 2D LVIDd:         4.30 cm LVIDs:         2.50 cm LV PW:         1.10 cm LV IVS:        1.20 cm LVOT diam:     1.80 cm LV SV:         63 LV SV Index:   32 LVOT Area:     2.54 cm  RIGHT VENTRICLE              IVC RV S prime:  15.10 cm/s  IVC diam: 2.70 cm TAPSE (M-mode): 2.1 cm LEFT ATRIUM             Index        RIGHT ATRIUM           Index LA diam:        4.50 cm 2.32 cm/m   RA Area:     22.80 cm LA Vol (A2C):   92.9 ml 47.98 ml/m  RA Volume:   66.60 ml  34.40 ml/m LA Vol (A4C):   95.3 ml 49.22 ml/m LA Biplane Vol: 95.2 ml 49.17 ml/m  AORTIC VALVE AV Area (Vmax):    1.89 cm AV Area (Vmean):   1.85 cm AV Area (VTI):     1.94 cm AV Vmax:           208.25 cm/s AV Vmean:          136.000 cm/s AV VTI:            0.324 m AV Peak Grad:      17.3 mmHg AV Mean Grad:      8.8 mmHg LVOT Vmax:         155.00 cm/s LVOT Vmean:        99.000 cm/s LVOT VTI:          0.247 m LVOT/AV VTI ratio: 0.76  AORTA Ao Root diam: 3.00 cm Ao Asc diam:  2.90 cm TRICUSPID VALVE TR Peak grad:   37.2 mmHg TR Vmax:        305.00 cm/s  SHUNTS Systemic VTI:  0.25 m Systemic Diam: 1.80 cm Vishnu Priya Mallipeddi Electronically signed by Lorelee Cover Mallipeddi Signature Date/Time: 08/08/2022/3:23:32 PM    Final     PHYSICAL EXAM  Temp:  [98.8 F (37.1 C)-101.7 F (38.7 C)] 99.9 F (37.7 C) (04/01 1152) Pulse Rate:  [84-101] 94 (04/01 1118) Resp:  [15-21] 17 (04/01 1118) BP: (127-158)/(36-73) 141/53 (04/01 1118) SpO2:  [95 %-100 %] 100 % (04/01 1118) FiO2 (%):  [40 %] 40 % (04/01 1118)  General -critically ill female intubated on sedation, in no apparent distress Cardiovascular - irregularly irregular rhythm  Neuro - intubated on sedation, on hard C collar.  Patient eyes closed, not following commands. With forced eye opening, eyes in midline, not blinking to visual threat, doll's eyes not able to perform with hard C collar, not tracking, pupil bilaterally 2 mm, sluggish. Corneal reflex not present bilaterally, gag and cough present. Breathing over the vent.  Facial symmetry not able to test due to ET tube.  Tongue protrusion not cooperative. On pain stimulation, slight withdraw in all extremities. DTR diminished and no  babinski. Sensation, coordination and gait not tested.    ASSESSMENT/PLAN Samantha Bishop is a 89 y.o. female with history of A-fib on Eliquis, hypertension, hyperlipidemia, history of PE, sleep apnea, brain aneurysm unruptured, anxiety and depression, who presented to [facility] after being found down at her independent living facility.  CT head revealed a large cerebellar bleed.  Eliquis was reversed with Andexxa neurosurgery was consulted and deemed not a candidate for decompressive crani  ICH: large left cerebellar bleed with cytotoxic edema, likely hypertensive in the setting of eliquis use Code Stroke CT head large left cerebellar hemorrhage with transtentorial herniation and mass effect on the fourth ventricle CTA head & neck no vascular lesion or spot sign in left hemorrhage, 2 mm ACOM aneurysm 2D Echo EF > 75% LDL 70 HgbA1c 6.6 VTE prophylaxis -SCDs Eliquis  prior to admission, now on no antithrombotics due to [place].  Disposition: Family has decided on comfort care measures, will start today. Code status - DNR  Mild hydrocephalus cytotoxic edema On 3% saline @ 75 -> off for comfort Na 136->145->155 Neurosurgery consulted no interventions  Hypertensive Emergency  Home meds: HCTZ 12.5 mg, metoprolol 25 mg twice daily Stable Off Cleviprex drip for comfort  Chronic atrial fibrillation  On Eliquis PTA Currently rate controlled  metoprolol on hold EKG A-fib S/p Andexxa reversal   Acute hypoxic respiratory failure due to above Management per CCM On Propofol drip Compassionate extubation today for comfort  Hx of stroke and aneurysm 06/2016 admitted for left hemianopia.  CT and MRI showed acute left PCA infarct, old right parietal and left cerebellar infarcts.  MRA head and neck showed left PCA distal occlusion, multiple aneurysms including 5 mm left ICA siphon, 2 mm left P-comm, 2 mm ACOM aneurysm.  EF 60 to 65%, LDL 90, A1c 6.3.  Patient Xarelto changed to Eliquis on  discharge.  Also put on pravastatin 20 on discharge.  hyperlipidemia Home meds: Pravastatin 20 LDL 70  Dysphagia OG tube will be removed for comfort  Other Stroke Risk Factors Advanced Age >/= 36  Obstructive sleep apnea, on CPAP at home  Other Active Problems Leukocytosis-WBCs 12.0->15.0, Tmax 101.7 Anemia-Hgb 11.9-> 11.7 Multiple cerebral aneurysm as above  Hospital day # 2   This patient is critically ill due to large cerebellar ICH, mild hydrocephalus, respiratory failure, hypertensive emergency and at significant risk of neurological worsening, death form obstructive hydrocephalus, brainstem compression, heart failure, stroke. This patient's care requires constant monitoring of vital signs, hemodynamics, respiratory and cardiac monitoring, review of multiple databases, neurological assessment, discussion with family, other specialists and medical decision making of high complexity. I spent 30 minutes of neurocritical care time in the care of this patient. I had long discussion with son over the phone, updated pt current condition, treatment plan and poor prognosis, and answered all the questions.  He expressed understanding and appreciation and told me that family had decided for comfort care measures.    Rosalin Hawking, MD PhD Stroke Neurology 09/02/2022 12:05 PM     To contact Stroke Continuity provider, please refer to [email or web address] After hours, contact General Neurology

## 2022-09-08 NOTE — Progress Notes (Signed)
   NAME:  Samantha Bishop, MRN:  [id number], DOB:  Oct 01, 1924, LOS: 2 ADMISSION DATE:  07/09/2022, CONSULTATION DATE:  07/15/2022 REFERRING MD:  Neuro, CHIEF COMPLAINT:  found down   History of Present Illness:  This is a patient with a history of atrial fibrillation who was found down at her independent living SNF.  In the ER she was intubated for airway protection, imaging revealed a large cerebellar hemorrhage with mass effect.  She was deemed not to be an operative candidate and is admitted to the neurology service.  She has been started on 3% saline.  PCCM was consulted for help with medical management.    Pertinent  Medical History  Hypertension Atrial fibrillation on AC Distant history of PE DJD Osteoarthritis  Significant Hospital Events: Including procedures, antibiotic start and stop dates in addition to other pertinent events     Interim History / Subjective:  NAEON. Apparently family has opted for comfort measures but likely not until approximately Thursday 4/4 or Friday 4/5.  Objective   Blood pressure (!) 140/59, pulse 99, temperature 99.4 F (37.4 C), temperature source Axillary, resp. rate 17, weight 81.9 kg, SpO2 99 %.    Vent Mode: PRVC FiO2 (%):  [40 %] 40 % Set Rate:  [15 bmp] 15 bmp Vt Set:  [490 mL] 490 mL PEEP:  [5 cmH20] 5 cmH20 Plateau Pressure:  [7 cmH20-13 cmH20] 7 cmH20   Intake/Output Summary (Last 24 hours) at 09/05/2022 0753 Last data filed at 08/27/2022 0600 Gross per 24 hour  Intake 2165.85 ml  Output 1250 ml  Net 915.85 ml    Filed Weights   08/01/2022 1700 08/08/22 0500  Weight: 79.8 kg 81.9 kg    Examination: General: Elderly female, resting in bed, in NAD. Neuro: Minimally responsive, withdraws to pain LUE and bilateral LE's, minimal RUE. Does not follow any commands. HEENT: [place]/AT. Sclerae anicteric. ETT in place. Cardiovascular: IRIR, no M/R/G.  Lungs: Respirations even and unlabored.  CTA bilaterally, No W/R/R. Abdomen: BS x 4, soft,  NT/ND.  Musculoskeletal: No gross deformities, no edema.  Skin: Intact, warm, no rashes.  Assessment & Plan:   Acute cerebellar hemorrhage with brain compression and coma Cerebral edema with upward tentorial herniation Mild hydrocephalus Hypertension Chronic atrial fibrillation on Eliquis, s/p reversal with Andexxa Acute hypoxic respiratory failure in setting of above and inability protect airway Hypokalemia - being repleted Hypernatremia - iatrogenic as on 3% NS, currently being held DNR Status  Plan: Neurology team is following and managing Hold 3% NS for now given Na rise to 155 Continue Cleviprex with goal SBP 130-150 Continue Propofol Additional 36mEq K per tube now Elevate head end of the bed >30 degrees Continue full vent support, mental status precludes weaning Comfort measures planned for possibly Thursday 4/4 or Friday 4/5   Best Practice (right click and "Reselect all SmartList Selections" daily)   Diet/type: NPO DVT prophylaxis: not indicated GI prophylaxis: PPI Lines: N/A Foley:  Yes, and it is still needed Code Status:  full code Last date of multidisciplinary goals of care discussion [attempting to find family]   CC time: 30 min.   Montey Hora, [place] [facility] For pager details, please see AMION or use Epic chat  After 1900, please call [place] for cross coverage needs 08/29/2022, 8:05 AM

## 2022-09-08 NOTE — Progress Notes (Signed)
  Transition of Care [facility]) Screening Note   Patient Details  Name: ZILPHA PROVIDENCE Date of Birth: December 07, 1924   Transition of Care [facility]) CM/SW Contact:    Benard Halsted, LCSW Phone Number: 08/25/2022, 12:30 PM    Transition of Care Department [facility]) has reviewed patient who is transitioning to comfort care. If new patient transition needs arise, please place a TOC consult.

## 2022-09-08 NOTE — Progress Notes (Signed)
Pt extubated to comfort care per CCM order.

## 2022-09-08 DEATH — deceased
